# Patient Record
Sex: Female | Born: 1950 | Race: White | Hispanic: No | Marital: Married | Smoking: Never smoker
Health system: Southern US, Community
[De-identification: ages and names within clinical notes are randomized; demographics above are authoritative.]

## PROBLEM LIST (undated history)

## (undated) DIAGNOSIS — M1990 Unspecified osteoarthritis, unspecified site: Secondary | ICD-10-CM

## (undated) DIAGNOSIS — K449 Diaphragmatic hernia without obstruction or gangrene: Secondary | ICD-10-CM

## (undated) DIAGNOSIS — I10 Essential (primary) hypertension: Secondary | ICD-10-CM

## (undated) DIAGNOSIS — D500 Iron deficiency anemia secondary to blood loss (chronic): Secondary | ICD-10-CM

## (undated) DIAGNOSIS — I4891 Unspecified atrial fibrillation: Secondary | ICD-10-CM

## (undated) DIAGNOSIS — K5790 Diverticulosis of intestine, part unspecified, without perforation or abscess without bleeding: Secondary | ICD-10-CM

## (undated) DIAGNOSIS — D469 Myelodysplastic syndrome, unspecified: Secondary | ICD-10-CM

## (undated) DIAGNOSIS — G4733 Obstructive sleep apnea (adult) (pediatric): Secondary | ICD-10-CM

## (undated) DIAGNOSIS — K257 Chronic gastric ulcer without hemorrhage or perforation: Secondary | ICD-10-CM

## (undated) DIAGNOSIS — K219 Gastro-esophageal reflux disease without esophagitis: Secondary | ICD-10-CM

## (undated) DIAGNOSIS — E785 Hyperlipidemia, unspecified: Secondary | ICD-10-CM

## (undated) DIAGNOSIS — I2699 Other pulmonary embolism without acute cor pulmonale: Secondary | ICD-10-CM

## (undated) DIAGNOSIS — Z7952 Long term (current) use of systemic steroids: Secondary | ICD-10-CM

## (undated) DIAGNOSIS — N3281 Overactive bladder: Secondary | ICD-10-CM

## (undated) DIAGNOSIS — I82409 Acute embolism and thrombosis of unspecified deep veins of unspecified lower extremity: Secondary | ICD-10-CM

## (undated) DIAGNOSIS — IMO0002 Reserved for concepts with insufficient information to code with codable children: Secondary | ICD-10-CM

## (undated) DIAGNOSIS — F32A Depression, unspecified: Secondary | ICD-10-CM

## (undated) DIAGNOSIS — C946 Myelodysplastic disease, not classified: Secondary | ICD-10-CM

## (undated) DIAGNOSIS — G629 Polyneuropathy, unspecified: Secondary | ICD-10-CM

## (undated) DIAGNOSIS — K222 Esophageal obstruction: Secondary | ICD-10-CM

## (undated) DIAGNOSIS — M8580 Other specified disorders of bone density and structure, unspecified site: Secondary | ICD-10-CM

## (undated) DIAGNOSIS — H9190 Unspecified hearing loss, unspecified ear: Secondary | ICD-10-CM

## (undated) DIAGNOSIS — A5002 Early congenital syphilitic osteochondropathy: Secondary | ICD-10-CM

## (undated) DIAGNOSIS — I6381 Other cerebral infarction due to occlusion or stenosis of small artery: Secondary | ICD-10-CM

## (undated) DIAGNOSIS — F329 Major depressive disorder, single episode, unspecified: Secondary | ICD-10-CM

## (undated) DIAGNOSIS — D649 Anemia, unspecified: Secondary | ICD-10-CM

## (undated) DIAGNOSIS — M313 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: Secondary | ICD-10-CM

## (undated) DIAGNOSIS — M9080 Osteopathy in diseases classified elsewhere, unspecified site: Secondary | ICD-10-CM

## (undated) DIAGNOSIS — J8409 Other alveolar and parieto-alveolar conditions: Secondary | ICD-10-CM

## (undated) DIAGNOSIS — N841 Polyp of cervix uteri: Secondary | ICD-10-CM

## (undated) DIAGNOSIS — L97909 Non-pressure chronic ulcer of unspecified part of unspecified lower leg with unspecified severity: Secondary | ICD-10-CM

## (undated) HISTORY — DX: Obstructive sleep apnea (adult) (pediatric): G47.33

## (undated) HISTORY — DX: Diaphragmatic hernia without obstruction or gangrene: K44.9

## (undated) HISTORY — DX: Depression, unspecified: F32.A

## (undated) HISTORY — PX: LUMBAR EPIDURAL INJECTION: SHX1980

## (undated) HISTORY — PX: IVC FILTER INSERTION: CATH118245

## (undated) HISTORY — DX: Esophageal obstruction: K22.2

## (undated) HISTORY — PX: COLONOSCOPY: SHX174

## (undated) HISTORY — DX: Diverticulosis of intestine, part unspecified, without perforation or abscess without bleeding: K57.90

## (undated) HISTORY — PX: ESOPHAGOGASTRODUODENOSCOPY: SHX1529

## (undated) HISTORY — DX: Unspecified osteoarthritis, unspecified site: M19.90

## (undated) HISTORY — DX: Hyperlipidemia, unspecified: E78.5

## (undated) HISTORY — DX: Acute embolism and thrombosis of unspecified deep veins of unspecified lower extremity: I82.409

## (undated) HISTORY — DX: Other pulmonary embolism without acute cor pulmonale: I26.99

## (undated) HISTORY — DX: Reserved for concepts with insufficient information to code with codable children: IMO0002

## (undated) HISTORY — DX: Polyneuropathy, unspecified: G62.9

## (undated) HISTORY — DX: Gastro-esophageal reflux disease without esophagitis: K21.9

## (undated) HISTORY — DX: Polyp of cervix uteri: N84.1

## (undated) HISTORY — DX: Osteopathy in diseases classified elsewhere, unspecified site: M90.80

## (undated) HISTORY — DX: Major depressive disorder, single episode, unspecified: F32.9

## (undated) HISTORY — DX: Early congenital syphilitic osteochondropathy: A50.02

## (undated) HISTORY — PX: OTHER SURGICAL HISTORY: SHX169

## (undated) HISTORY — DX: Anemia, unspecified: D64.9

---
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
## 1997-12-03 ENCOUNTER — Other Ambulatory Visit: Admission: RE | Admit: 1997-12-03 | Discharge: 1997-12-03 | Payer: Self-pay | Admitting: Internal Medicine

## 1999-04-18 ENCOUNTER — Other Ambulatory Visit: Admission: RE | Admit: 1999-04-18 | Discharge: 1999-04-18 | Payer: Self-pay | Admitting: *Deleted

## 2000-07-25 ENCOUNTER — Other Ambulatory Visit: Admission: RE | Admit: 2000-07-25 | Discharge: 2000-07-25 | Payer: Self-pay | Admitting: *Deleted

## 2001-11-01 ENCOUNTER — Other Ambulatory Visit: Admission: RE | Admit: 2001-11-01 | Discharge: 2001-11-01 | Payer: Self-pay | Admitting: *Deleted

## 2002-06-24 ENCOUNTER — Ambulatory Visit (HOSPITAL_COMMUNITY): Admission: RE | Admit: 2002-06-24 | Discharge: 2002-06-24 | Payer: Self-pay | Admitting: *Deleted

## 2002-06-24 ENCOUNTER — Encounter: Payer: Self-pay | Admitting: *Deleted

## 2002-12-30 ENCOUNTER — Encounter: Payer: Self-pay | Admitting: Internal Medicine

## 2002-12-30 DIAGNOSIS — K5730 Diverticulosis of large intestine without perforation or abscess without bleeding: Secondary | ICD-10-CM | POA: Insufficient documentation

## 2003-03-22 DIAGNOSIS — N841 Polyp of cervix uteri: Secondary | ICD-10-CM

## 2003-03-22 HISTORY — DX: Polyp of cervix uteri: N84.1

## 2003-04-03 ENCOUNTER — Other Ambulatory Visit: Admission: RE | Admit: 2003-04-03 | Discharge: 2003-04-03 | Payer: Self-pay | Admitting: *Deleted

## 2003-07-29 ENCOUNTER — Ambulatory Visit (HOSPITAL_COMMUNITY): Admission: RE | Admit: 2003-07-29 | Discharge: 2003-07-29 | Payer: Self-pay | Admitting: *Deleted

## 2004-05-31 ENCOUNTER — Other Ambulatory Visit: Admission: RE | Admit: 2004-05-31 | Discharge: 2004-05-31 | Payer: Self-pay | Admitting: *Deleted

## 2004-11-23 ENCOUNTER — Ambulatory Visit (HOSPITAL_BASED_OUTPATIENT_CLINIC_OR_DEPARTMENT_OTHER): Admission: RE | Admit: 2004-11-23 | Discharge: 2004-11-23 | Payer: Self-pay | Admitting: Internal Medicine

## 2004-11-27 ENCOUNTER — Ambulatory Visit: Payer: Self-pay | Admitting: Internal Medicine

## 2005-04-26 ENCOUNTER — Ambulatory Visit (HOSPITAL_COMMUNITY): Admission: RE | Admit: 2005-04-26 | Discharge: 2005-04-26 | Payer: Self-pay | Admitting: *Deleted

## 2005-07-11 ENCOUNTER — Other Ambulatory Visit: Admission: RE | Admit: 2005-07-11 | Discharge: 2005-07-11 | Payer: Self-pay | Admitting: Obstetrics and Gynecology

## 2005-11-23 ENCOUNTER — Ambulatory Visit: Payer: Self-pay | Admitting: Internal Medicine

## 2005-12-06 ENCOUNTER — Ambulatory Visit: Payer: Self-pay

## 2006-01-24 ENCOUNTER — Ambulatory Visit: Payer: Self-pay | Admitting: Internal Medicine

## 2006-01-29 ENCOUNTER — Ambulatory Visit: Payer: Self-pay | Admitting: Internal Medicine

## 2006-02-15 ENCOUNTER — Ambulatory Visit: Payer: Self-pay | Admitting: Internal Medicine

## 2006-02-23 ENCOUNTER — Ambulatory Visit: Payer: Self-pay | Admitting: Internal Medicine

## 2006-02-23 DIAGNOSIS — K449 Diaphragmatic hernia without obstruction or gangrene: Secondary | ICD-10-CM | POA: Insufficient documentation

## 2006-04-09 ENCOUNTER — Ambulatory Visit: Payer: Self-pay | Admitting: Internal Medicine

## 2006-07-05 ENCOUNTER — Ambulatory Visit: Payer: Self-pay | Admitting: Gastroenterology

## 2006-07-05 LAB — CONVERTED CEMR LAB
Basophils Absolute: 0.1 10*3/uL (ref 0.0–0.1)
Basophils Relative: 0.7 % (ref 0.0–1.0)
Eosinophil percent: 0.9 % (ref 0.0–5.0)
HCT: 38.1 % (ref 36.0–46.0)
Hemoglobin: 12.4 g/dL (ref 12.0–15.0)
Lymphocytes Relative: 31 % (ref 12.0–46.0)
MCHC: 32.6 g/dL (ref 30.0–36.0)
MCV: 91 fL (ref 78.0–100.0)
Monocytes Absolute: 0.5 10*3/uL (ref 0.2–0.7)
Monocytes Relative: 6.2 % (ref 3.0–11.0)
Neutro Abs: 4.3 10*3/uL (ref 1.4–7.7)
Neutrophils Relative %: 61.2 % (ref 43.0–77.0)
Platelets: 294 10*3/uL (ref 150–400)
RBC: 4.18 M/uL (ref 3.87–5.11)
RDW: 14.2 % (ref 11.5–14.6)
WBC: 7.3 10*3/uL (ref 4.5–10.5)

## 2006-09-26 ENCOUNTER — Ambulatory Visit (HOSPITAL_COMMUNITY): Admission: RE | Admit: 2006-09-26 | Discharge: 2006-09-26 | Payer: Self-pay | Admitting: Obstetrics and Gynecology

## 2006-09-26 ENCOUNTER — Ambulatory Visit: Payer: Self-pay | Admitting: Family Medicine

## 2006-11-26 ENCOUNTER — Other Ambulatory Visit: Admission: RE | Admit: 2006-11-26 | Discharge: 2006-11-26 | Payer: Self-pay | Admitting: Obstetrics and Gynecology

## 2006-11-29 ENCOUNTER — Ambulatory Visit: Payer: Self-pay | Admitting: Internal Medicine

## 2006-11-29 LAB — CONVERTED CEMR LAB
ALT: 20 units/L (ref 0–40)
AST: 23 units/L (ref 0–37)
Albumin: 4 g/dL (ref 3.5–5.2)
Alkaline Phosphatase: 70 units/L (ref 39–117)
BUN: 13 mg/dL (ref 6–23)
Basophils Absolute: 0.1 10*3/uL (ref 0.0–0.1)
Basophils Relative: 1.4 % — ABNORMAL HIGH (ref 0.0–1.0)
Bilirubin, Direct: 0.1 mg/dL (ref 0.0–0.3)
CO2: 31 meq/L (ref 19–32)
Calcium: 9 mg/dL (ref 8.4–10.5)
Chloride: 108 meq/L (ref 96–112)
Cholesterol: 171 mg/dL (ref 0–200)
Creatinine, Ser: 0.8 mg/dL (ref 0.4–1.2)
Eosinophils Absolute: 0.1 10*3/uL (ref 0.0–0.6)
Eosinophils Relative: 1 % (ref 0.0–5.0)
GFR calc Af Amer: 96 mL/min
GFR calc non Af Amer: 79 mL/min
Glucose, Bld: 102 mg/dL — ABNORMAL HIGH (ref 70–99)
HCT: 36 % (ref 36.0–46.0)
HDL: 40.3 mg/dL (ref >39.0)
Hemoglobin: 12.6 g/dL (ref 12.0–15.0)
Hgb A1c MFr Bld: 5.8 % (ref 4.6–6.0)
LDL Cholesterol: 102 mg/dL — ABNORMAL HIGH (ref 0–99)
Lymphocytes Relative: 27.3 % (ref 12.0–46.0)
MCHC: 35 g/dL (ref 30.0–36.0)
MCV: 90.8 fL (ref 78.0–100.0)
Monocytes Absolute: 0.5 10*3/uL (ref 0.2–0.7)
Monocytes Relative: 8.5 % (ref 3.0–11.0)
Neutro Abs: 3.2 10*3/uL (ref 1.4–7.7)
Neutrophils Relative %: 61.8 % (ref 43.0–77.0)
Platelets: 249 10*3/uL (ref 150–400)
Potassium: 4 meq/L (ref 3.5–5.1)
RBC: 3.96 M/uL (ref 3.87–5.11)
RDW: 12.7 % (ref 11.5–14.6)
Sodium: 143 meq/L (ref 135–145)
TSH: 1.02 microintl units/mL (ref 0.35–5.50)
Total Bilirubin: 0.5 mg/dL (ref 0.3–1.2)
Total CHOL/HDL Ratio: 4.2
Total Protein: 7.1 g/dL (ref 6.0–8.3)
Triglycerides: 143 mg/dL (ref 0–149)
VLDL: 29 mg/dL (ref 0–40)
WBC: 5.3 10*3/uL (ref 4.5–10.5)

## 2007-10-03 ENCOUNTER — Emergency Department (HOSPITAL_COMMUNITY): Admission: EM | Admit: 2007-10-03 | Discharge: 2007-10-03 | Payer: Self-pay | Admitting: Family Medicine

## 2007-10-09 ENCOUNTER — Ambulatory Visit (HOSPITAL_COMMUNITY): Admission: RE | Admit: 2007-10-09 | Discharge: 2007-10-09 | Payer: Self-pay | Admitting: Obstetrics and Gynecology

## 2007-11-28 ENCOUNTER — Encounter: Payer: Self-pay | Admitting: Internal Medicine

## 2007-12-03 ENCOUNTER — Ambulatory Visit: Payer: Self-pay | Admitting: Internal Medicine

## 2007-12-03 ENCOUNTER — Encounter (INDEPENDENT_AMBULATORY_CARE_PROVIDER_SITE_OTHER): Payer: Self-pay | Admitting: *Deleted

## 2007-12-03 DIAGNOSIS — M2550 Pain in unspecified joint: Secondary | ICD-10-CM | POA: Insufficient documentation

## 2007-12-03 DIAGNOSIS — K219 Gastro-esophageal reflux disease without esophagitis: Secondary | ICD-10-CM | POA: Insufficient documentation

## 2007-12-03 DIAGNOSIS — J309 Allergic rhinitis, unspecified: Secondary | ICD-10-CM | POA: Insufficient documentation

## 2007-12-03 DIAGNOSIS — G4730 Sleep apnea, unspecified: Secondary | ICD-10-CM | POA: Insufficient documentation

## 2007-12-05 ENCOUNTER — Encounter (INDEPENDENT_AMBULATORY_CARE_PROVIDER_SITE_OTHER): Payer: Self-pay | Admitting: *Deleted

## 2007-12-10 ENCOUNTER — Ambulatory Visit: Payer: Self-pay | Admitting: Internal Medicine

## 2007-12-11 ENCOUNTER — Telehealth: Payer: Self-pay | Admitting: Internal Medicine

## 2007-12-11 ENCOUNTER — Ambulatory Visit: Payer: Self-pay | Admitting: Internal Medicine

## 2007-12-11 ENCOUNTER — Encounter (INDEPENDENT_AMBULATORY_CARE_PROVIDER_SITE_OTHER): Payer: Self-pay | Admitting: *Deleted

## 2007-12-11 LAB — CONVERTED CEMR LAB
Basophils Absolute: 0.1 10*3/uL (ref 0.0–0.1)
Basophils Relative: 1.1 % — ABNORMAL HIGH (ref 0.0–1.0)
Eosinophils Absolute: 0.1 10*3/uL (ref 0.0–0.7)
Eosinophils Relative: 0.8 % (ref 0.0–5.0)
Folate: 9.6 ng/mL
HCT: 25.5 % — ABNORMAL LOW (ref 36.0–46.0)
Hemoglobin: 8 g/dL — ABNORMAL LOW (ref 12.0–15.0)
Iron: 17 ug/dL — ABNORMAL LOW (ref 42–145)
Lymphocytes Relative: 22.6 % (ref 12.0–46.0)
MCHC: 31.2 g/dL (ref 30.0–36.0)
MCV: 69.4 fL — ABNORMAL LOW (ref 78.0–100.0)
Monocytes Absolute: 0.5 10*3/uL (ref 0.1–1.0)
Monocytes Relative: 7.9 % (ref 3.0–12.0)
Neutro Abs: 4.2 10*3/uL (ref 1.4–7.7)
Neutrophils Relative %: 67.6 % (ref 43.0–77.0)
OCCULT 1: NEGATIVE
OCCULT 2: NEGATIVE
OCCULT 3: NEGATIVE
Platelets: 403 10*3/uL — ABNORMAL HIGH (ref 150–400)
RBC: 3.68 M/uL — ABNORMAL LOW (ref 3.87–5.11)
RDW: 15.7 % — ABNORMAL HIGH (ref 11.5–14.6)
Saturation Ratios: 3.5 % — ABNORMAL LOW (ref 20.0–50.0)
Transferrin: 348.5 mg/dL (ref >=212.0)
Vitamin B-12: 211 pg/mL (ref 211–911)
WBC: 6.3 10*3/uL (ref 4.5–10.5)

## 2007-12-12 ENCOUNTER — Telehealth (INDEPENDENT_AMBULATORY_CARE_PROVIDER_SITE_OTHER): Payer: Self-pay | Admitting: *Deleted

## 2007-12-12 ENCOUNTER — Encounter: Payer: Self-pay | Admitting: Family Medicine

## 2007-12-25 ENCOUNTER — Telehealth (INDEPENDENT_AMBULATORY_CARE_PROVIDER_SITE_OTHER): Payer: Self-pay | Admitting: *Deleted

## 2008-01-01 ENCOUNTER — Ambulatory Visit: Payer: Self-pay | Admitting: Pulmonary Disease

## 2008-01-01 ENCOUNTER — Ambulatory Visit: Payer: Self-pay | Admitting: Internal Medicine

## 2008-01-01 DIAGNOSIS — I10 Essential (primary) hypertension: Secondary | ICD-10-CM | POA: Insufficient documentation

## 2008-01-01 LAB — CONVERTED CEMR LAB
Basophils Absolute: 0.1 10*3/uL (ref 0.0–0.1)
Basophils Relative: 1.2 % — ABNORMAL HIGH (ref 0.0–1.0)
Eosinophils Absolute: 0.1 10*3/uL (ref 0.0–0.7)
Eosinophils Relative: 1.2 % (ref 0.0–5.0)
HCT: 31.1 % — ABNORMAL LOW (ref 36.0–46.0)
Hemoglobin: 9.8 g/dL — ABNORMAL LOW (ref 12.0–15.0)
Lymphocytes Relative: 24.8 % (ref 12.0–46.0)
MCHC: 31.6 g/dL (ref 30.0–36.0)
MCV: 75.6 fL — ABNORMAL LOW (ref 78.0–100.0)
Monocytes Absolute: 0.5 10*3/uL (ref 0.1–1.0)
Monocytes Relative: 7.9 % (ref 3.0–12.0)
Neutro Abs: 3.7 10*3/uL (ref 1.4–7.7)
Neutrophils Relative %: 64.9 % (ref 43.0–77.0)
Platelets: 353 10*3/uL (ref 150–400)
RBC: 4.11 M/uL (ref 3.87–5.11)
RDW: 25.3 % — ABNORMAL HIGH (ref 11.5–14.6)
Tissue Transglutaminase Ab, IgA: 0.8 units (ref <7)
WBC: 5.9 10*3/uL (ref 4.5–10.5)

## 2008-01-02 ENCOUNTER — Other Ambulatory Visit: Admission: RE | Admit: 2008-01-02 | Discharge: 2008-01-02 | Payer: Self-pay | Admitting: Obstetrics & Gynecology

## 2008-01-07 ENCOUNTER — Telehealth: Payer: Self-pay | Admitting: Internal Medicine

## 2008-01-07 ENCOUNTER — Telehealth (INDEPENDENT_AMBULATORY_CARE_PROVIDER_SITE_OTHER): Payer: Self-pay | Admitting: *Deleted

## 2008-01-14 ENCOUNTER — Telehealth: Payer: Self-pay | Admitting: Pulmonary Disease

## 2008-01-29 ENCOUNTER — Ambulatory Visit: Payer: Self-pay | Admitting: Pulmonary Disease

## 2008-02-14 ENCOUNTER — Telehealth (INDEPENDENT_AMBULATORY_CARE_PROVIDER_SITE_OTHER): Payer: Self-pay | Admitting: *Deleted

## 2008-02-14 DIAGNOSIS — D500 Iron deficiency anemia secondary to blood loss (chronic): Secondary | ICD-10-CM | POA: Insufficient documentation

## 2008-03-03 ENCOUNTER — Ambulatory Visit: Payer: Self-pay | Admitting: Internal Medicine

## 2008-03-05 ENCOUNTER — Telehealth: Payer: Self-pay | Admitting: Pulmonary Disease

## 2008-04-02 ENCOUNTER — Telehealth: Payer: Self-pay | Admitting: Internal Medicine

## 2008-04-14 ENCOUNTER — Telehealth: Payer: Self-pay | Admitting: Internal Medicine

## 2008-05-03 ENCOUNTER — Ambulatory Visit (HOSPITAL_COMMUNITY): Admission: RE | Admit: 2008-05-03 | Discharge: 2008-05-03 | Payer: Self-pay | Admitting: Orthopedic Surgery

## 2008-05-03 ENCOUNTER — Encounter: Payer: Self-pay | Admitting: Internal Medicine

## 2008-05-06 ENCOUNTER — Encounter: Payer: Self-pay | Admitting: Internal Medicine

## 2008-05-11 ENCOUNTER — Encounter: Admission: RE | Admit: 2008-05-11 | Discharge: 2008-06-09 | Payer: Self-pay | Admitting: Orthopedic Surgery

## 2008-07-21 ENCOUNTER — Telehealth (INDEPENDENT_AMBULATORY_CARE_PROVIDER_SITE_OTHER): Payer: Self-pay | Admitting: *Deleted

## 2008-07-21 ENCOUNTER — Ambulatory Visit: Payer: Self-pay | Admitting: Internal Medicine

## 2008-07-22 LAB — CONVERTED CEMR LAB
Basophils Absolute: 0.1 10*3/uL (ref 0.0–0.1)
Basophils Relative: 1 % (ref 0.0–3.0)
Eosinophils Absolute: 0.1 10*3/uL (ref 0.0–0.7)
Eosinophils Relative: 0.8 % (ref 0.0–5.0)
HCT: 33.6 % — ABNORMAL LOW (ref 36.0–46.0)
Hemoglobin: 11.4 g/dL — ABNORMAL LOW (ref 12.0–15.0)
Lymphocytes Relative: 29.3 % (ref 12.0–46.0)
MCHC: 33.8 g/dL (ref 30.0–36.0)
MCV: 89.9 fL (ref 78.0–100.0)
Monocytes Absolute: 0.5 10*3/uL (ref 0.1–1.0)
Monocytes Relative: 7.6 % (ref 3.0–12.0)
Neutro Abs: 4.2 10*3/uL (ref 1.4–7.7)
Neutrophils Relative %: 61.3 % (ref 43.0–77.0)
Platelets: 285 10*3/uL (ref 150–400)
RBC: 3.73 M/uL — ABNORMAL LOW (ref 3.87–5.11)
RDW: 12.7 % (ref 11.5–14.6)
Vitamin B-12: 262 pg/mL (ref 211–911)
WBC: 7 10*3/uL (ref 4.5–10.5)

## 2008-07-31 ENCOUNTER — Telehealth (INDEPENDENT_AMBULATORY_CARE_PROVIDER_SITE_OTHER): Payer: Self-pay | Admitting: *Deleted

## 2008-11-17 ENCOUNTER — Ambulatory Visit (HOSPITAL_COMMUNITY): Admission: RE | Admit: 2008-11-17 | Discharge: 2008-11-17 | Payer: Self-pay | Admitting: Internal Medicine

## 2008-12-07 ENCOUNTER — Ambulatory Visit: Payer: Self-pay | Admitting: Internal Medicine

## 2008-12-07 DIAGNOSIS — R7309 Other abnormal glucose: Secondary | ICD-10-CM | POA: Insufficient documentation

## 2008-12-07 DIAGNOSIS — D510 Vitamin B12 deficiency anemia due to intrinsic factor deficiency: Secondary | ICD-10-CM | POA: Insufficient documentation

## 2008-12-07 DIAGNOSIS — M5126 Other intervertebral disc displacement, lumbar region: Secondary | ICD-10-CM | POA: Insufficient documentation

## 2008-12-07 DIAGNOSIS — E785 Hyperlipidemia, unspecified: Secondary | ICD-10-CM | POA: Insufficient documentation

## 2008-12-14 ENCOUNTER — Encounter (INDEPENDENT_AMBULATORY_CARE_PROVIDER_SITE_OTHER): Payer: Self-pay | Admitting: *Deleted

## 2008-12-29 ENCOUNTER — Ambulatory Visit: Payer: Self-pay | Admitting: Internal Medicine

## 2008-12-29 ENCOUNTER — Encounter (INDEPENDENT_AMBULATORY_CARE_PROVIDER_SITE_OTHER): Payer: Self-pay | Admitting: *Deleted

## 2008-12-29 LAB — CONVERTED CEMR LAB
OCCULT 1: NEGATIVE
OCCULT 2: NEGATIVE
OCCULT 3: NEGATIVE

## 2009-03-09 ENCOUNTER — Telehealth: Payer: Self-pay | Admitting: Internal Medicine

## 2009-03-09 DIAGNOSIS — N959 Unspecified menopausal and perimenopausal disorder: Secondary | ICD-10-CM | POA: Insufficient documentation

## 2009-04-05 ENCOUNTER — Ambulatory Visit (HOSPITAL_COMMUNITY): Admission: RE | Admit: 2009-04-05 | Discharge: 2009-04-05 | Payer: Self-pay | Admitting: Internal Medicine

## 2009-04-05 ENCOUNTER — Encounter: Payer: Self-pay | Admitting: Internal Medicine

## 2009-05-09 ENCOUNTER — Emergency Department (HOSPITAL_COMMUNITY): Admission: EM | Admit: 2009-05-09 | Discharge: 2009-05-09 | Payer: Self-pay | Admitting: Family Medicine

## 2009-05-09 ENCOUNTER — Encounter: Payer: Self-pay | Admitting: Internal Medicine

## 2009-08-15 ENCOUNTER — Ambulatory Visit: Payer: Self-pay | Admitting: Obstetrics and Gynecology

## 2009-08-15 ENCOUNTER — Inpatient Hospital Stay (HOSPITAL_COMMUNITY): Admission: AD | Admit: 2009-08-15 | Discharge: 2009-08-15 | Payer: Self-pay | Admitting: Obstetrics and Gynecology

## 2009-08-18 ENCOUNTER — Telehealth (INDEPENDENT_AMBULATORY_CARE_PROVIDER_SITE_OTHER): Payer: Self-pay | Admitting: *Deleted

## 2009-12-30 ENCOUNTER — Ambulatory Visit (HOSPITAL_COMMUNITY): Admission: RE | Admit: 2009-12-30 | Discharge: 2009-12-30 | Payer: Self-pay | Admitting: Obstetrics and Gynecology

## 2010-01-10 ENCOUNTER — Ambulatory Visit: Payer: Self-pay | Admitting: Internal Medicine

## 2010-01-10 DIAGNOSIS — R011 Cardiac murmur, unspecified: Secondary | ICD-10-CM | POA: Insufficient documentation

## 2010-01-11 ENCOUNTER — Telehealth: Payer: Self-pay | Admitting: Internal Medicine

## 2010-01-11 ENCOUNTER — Ambulatory Visit: Payer: Self-pay | Admitting: Internal Medicine

## 2010-01-11 LAB — CONVERTED CEMR LAB
ALT: 12 units/L (ref 0–35)
AST: 19 units/L (ref 0–37)
Albumin: 4 g/dL (ref 3.5–5.2)
Alkaline Phosphatase: 61 units/L (ref 39–117)
BUN: 10 mg/dL (ref 6–23)
Basophils Absolute: 0 10*3/uL (ref 0.0–0.1)
Basophils Relative: 0.8 % (ref 0.0–3.0)
Bilirubin, Direct: 0.1 mg/dL (ref 0.0–0.3)
CO2: 29 meq/L (ref 19–32)
Calcium: 9.4 mg/dL (ref 8.4–10.5)
Chloride: 108 meq/L (ref 96–112)
Cholesterol: 145 mg/dL (ref 0–200)
Creatinine, Ser: 0.7 mg/dL (ref 0.4–1.2)
Eosinophils Absolute: 0 10*3/uL (ref 0.0–0.7)
Eosinophils Relative: 0.9 % (ref 0.0–5.0)
GFR calc non Af Amer: 96.01 mL/min (ref >60)
Glucose, Bld: 94 mg/dL (ref 70–99)
HCT: 23.8 % — CL (ref 36.0–46.0)
HDL: 44.4 mg/dL (ref >39.00)
Hemoglobin: 7.5 g/dL — CL (ref 12.0–15.0)
Iron: 16 ug/dL — ABNORMAL LOW (ref 42–145)
LDL Cholesterol: 88 mg/dL (ref 0–99)
Lymphocytes Relative: 25 % (ref 12.0–46.0)
Lymphs Abs: 1.3 10*3/uL (ref 0.7–4.0)
MCHC: 31.4 g/dL (ref 30.0–36.0)
MCV: 69.3 fL — ABNORMAL LOW (ref 78.0–100.0)
Monocytes Absolute: 0.5 10*3/uL (ref 0.1–1.0)
Monocytes Relative: 9.2 % (ref 3.0–12.0)
Neutro Abs: 3.3 10*3/uL (ref 1.4–7.7)
Neutrophils Relative %: 64.1 % (ref 43.0–77.0)
Platelets: 438 10*3/uL — ABNORMAL HIGH (ref 150.0–400.0)
Potassium: 4.3 meq/L (ref 3.5–5.1)
RBC: 3.44 M/uL — ABNORMAL LOW (ref 3.87–5.11)
RDW: 17.7 % — ABNORMAL HIGH (ref 11.5–14.6)
Sodium: 142 meq/L (ref 135–145)
TSH: 1 microintl units/mL (ref 0.35–5.50)
Total Bilirubin: 0.5 mg/dL (ref 0.3–1.2)
Total CHOL/HDL Ratio: 3
Total Protein: 7.4 g/dL (ref 6.0–8.3)
Triglycerides: 64 mg/dL (ref 0.0–149.0)
VLDL: 12.8 mg/dL (ref 0.0–40.0)
WBC: 5.1 10*3/uL (ref 4.5–10.5)

## 2010-01-14 ENCOUNTER — Telehealth (INDEPENDENT_AMBULATORY_CARE_PROVIDER_SITE_OTHER): Payer: Self-pay | Admitting: *Deleted

## 2010-01-14 LAB — CONVERTED CEMR LAB: Vit D, 25-Hydroxy: 29 ng/mL — ABNORMAL LOW (ref 30–89)

## 2010-01-25 ENCOUNTER — Ambulatory Visit: Payer: Self-pay | Admitting: Internal Medicine

## 2010-01-26 ENCOUNTER — Telehealth: Payer: Self-pay | Admitting: Internal Medicine

## 2010-01-27 LAB — CONVERTED CEMR LAB
Basophils Absolute: 0.1 10*3/uL (ref 0.0–0.1)
Basophils Relative: 1.1 % (ref 0.0–3.0)
Eosinophils Absolute: 0.1 10*3/uL (ref 0.0–0.7)
Eosinophils Relative: 1.7 % (ref 0.0–5.0)
HCT: 27.9 % — ABNORMAL LOW (ref 36.0–46.0)
Hemoglobin: 8.8 g/dL — ABNORMAL LOW (ref 12.0–15.0)
Lymphocytes Relative: 28.1 % (ref 12.0–46.0)
Lymphs Abs: 1.5 10*3/uL (ref 0.7–4.0)
MCHC: 31.4 g/dL (ref 30.0–36.0)
MCV: 73.1 fL — ABNORMAL LOW (ref 78.0–100.0)
Monocytes Absolute: 0.4 10*3/uL (ref 0.1–1.0)
Monocytes Relative: 7.5 % (ref 3.0–12.0)
Neutro Abs: 3.4 10*3/uL (ref 1.4–7.7)
Neutrophils Relative %: 61.6 % (ref 43.0–77.0)
Platelets: 365 10*3/uL (ref 150.0–400.0)
RBC: 3.82 M/uL — ABNORMAL LOW (ref 3.87–5.11)
RDW: 24.7 % — ABNORMAL HIGH (ref 11.5–14.6)
Vitamin B-12: 227 pg/mL (ref 211–911)
WBC: 5.4 10*3/uL (ref 4.5–10.5)

## 2010-02-01 ENCOUNTER — Ambulatory Visit: Payer: Self-pay | Admitting: Internal Medicine

## 2010-02-01 ENCOUNTER — Encounter (INDEPENDENT_AMBULATORY_CARE_PROVIDER_SITE_OTHER): Payer: Self-pay | Admitting: *Deleted

## 2010-02-01 LAB — CONVERTED CEMR LAB
OCCULT 1: NEGATIVE
OCCULT 2: NEGATIVE
OCCULT 3: NEGATIVE

## 2010-02-16 ENCOUNTER — Ambulatory Visit: Payer: Self-pay | Admitting: Internal Medicine

## 2010-02-16 ENCOUNTER — Encounter: Payer: Self-pay | Admitting: Internal Medicine

## 2010-02-16 DIAGNOSIS — D509 Iron deficiency anemia, unspecified: Secondary | ICD-10-CM | POA: Insufficient documentation

## 2010-02-24 ENCOUNTER — Ambulatory Visit: Payer: Self-pay | Admitting: Internal Medicine

## 2010-03-25 ENCOUNTER — Emergency Department (HOSPITAL_COMMUNITY): Admission: EM | Admit: 2010-03-25 | Discharge: 2010-03-25 | Payer: Self-pay | Admitting: Emergency Medicine

## 2010-03-27 ENCOUNTER — Emergency Department (HOSPITAL_COMMUNITY): Admission: EM | Admit: 2010-03-27 | Discharge: 2010-03-27 | Payer: Self-pay | Admitting: Family Medicine

## 2010-08-04 ENCOUNTER — Emergency Department (HOSPITAL_COMMUNITY)
Admission: EM | Admit: 2010-08-04 | Discharge: 2010-08-04 | Payer: Self-pay | Source: Home / Self Care | Admitting: Family Medicine

## 2010-08-04 ENCOUNTER — Encounter: Payer: Self-pay | Admitting: Internal Medicine

## 2010-08-09 ENCOUNTER — Ambulatory Visit: Payer: Self-pay | Admitting: Internal Medicine

## 2010-08-09 DIAGNOSIS — L0291 Cutaneous abscess, unspecified: Secondary | ICD-10-CM | POA: Insufficient documentation

## 2010-08-09 DIAGNOSIS — L0390 Cellulitis, unspecified: Secondary | ICD-10-CM

## 2010-08-11 ENCOUNTER — Telehealth (INDEPENDENT_AMBULATORY_CARE_PROVIDER_SITE_OTHER): Payer: Self-pay | Admitting: *Deleted

## 2010-08-11 LAB — CONVERTED CEMR LAB
Basophils Absolute: 0 10*3/uL (ref 0.0–0.1)
Basophils Relative: 0.4 % (ref 0.0–3.0)
Eosinophils Absolute: 0.1 10*3/uL (ref 0.0–0.7)
Eosinophils Relative: 1.1 % (ref 0.0–5.0)
HCT: 33.6 % — ABNORMAL LOW (ref 36.0–46.0)
Hemoglobin: 11.4 g/dL — ABNORMAL LOW (ref 12.0–15.0)
Lymphocytes Relative: 29.9 % (ref 12.0–46.0)
Lymphs Abs: 2.1 10*3/uL (ref 0.7–4.0)
MCHC: 33.8 g/dL (ref 30.0–36.0)
MCV: 91.9 fL (ref 78.0–100.0)
Monocytes Absolute: 0.2 10*3/uL (ref 0.1–1.0)
Monocytes Relative: 2.5 % — ABNORMAL LOW (ref 3.0–12.0)
Neutro Abs: 4.6 10*3/uL (ref 1.4–7.7)
Neutrophils Relative %: 66.1 % (ref 43.0–77.0)
Platelets: 302 10*3/uL (ref 150.0–400.0)
RBC: 3.66 M/uL — ABNORMAL LOW (ref 3.87–5.11)
RDW: 15.8 % — ABNORMAL HIGH (ref 11.5–14.6)
Sed Rate: 24 mm/hr — ABNORMAL HIGH (ref 0–22)
WBC: 7 10*3/uL (ref 4.5–10.5)

## 2010-08-21 ENCOUNTER — Emergency Department (HOSPITAL_COMMUNITY)
Admission: EM | Admit: 2010-08-21 | Discharge: 2010-08-21 | Payer: Self-pay | Source: Home / Self Care | Admitting: Family Medicine

## 2010-08-21 DIAGNOSIS — M313 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: Secondary | ICD-10-CM

## 2010-08-21 HISTORY — DX: 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: M31.30

## 2010-08-23 ENCOUNTER — Other Ambulatory Visit: Payer: Self-pay | Admitting: Internal Medicine

## 2010-08-23 ENCOUNTER — Ambulatory Visit
Admission: RE | Admit: 2010-08-23 | Discharge: 2010-08-23 | Payer: Self-pay | Source: Home / Self Care | Attending: Internal Medicine | Admitting: Internal Medicine

## 2010-08-23 ENCOUNTER — Encounter (INDEPENDENT_AMBULATORY_CARE_PROVIDER_SITE_OTHER): Payer: Self-pay | Admitting: *Deleted

## 2010-08-23 ENCOUNTER — Telehealth: Payer: Self-pay | Admitting: Internal Medicine

## 2010-08-23 DIAGNOSIS — R21 Rash and other nonspecific skin eruption: Secondary | ICD-10-CM | POA: Insufficient documentation

## 2010-08-23 LAB — CONVERTED CEMR LAB
Bilirubin Urine: NEGATIVE
Blood in Urine, dipstick: NEGATIVE
Glucose, Urine, Semiquant: NEGATIVE
Inflenza A Ag: NEGATIVE
Influenza B Ag: NEGATIVE
Ketones, urine, test strip: NEGATIVE
Nitrite: NEGATIVE
Protein, U semiquant: 30
Specific Gravity, Urine: 1.015
Urobilinogen, UA: 0.2
WBC Urine, dipstick: NEGATIVE
pH: 5

## 2010-08-24 ENCOUNTER — Telehealth: Payer: Self-pay | Admitting: Internal Medicine

## 2010-08-24 ENCOUNTER — Encounter: Payer: Self-pay | Admitting: Internal Medicine

## 2010-08-24 DIAGNOSIS — R51 Headache: Secondary | ICD-10-CM | POA: Insufficient documentation

## 2010-08-24 DIAGNOSIS — R519 Headache, unspecified: Secondary | ICD-10-CM | POA: Insufficient documentation

## 2010-08-24 LAB — CBC WITH DIFFERENTIAL/PLATELET
Basophils Absolute: 0 10*3/uL (ref 0.0–0.1)
Basophils Relative: 0.5 % (ref 0.0–3.0)
Eosinophils Absolute: 0 10*3/uL (ref 0.0–0.7)
Eosinophils Relative: 0.4 % (ref 0.0–5.0)
HCT: 32.5 % — ABNORMAL LOW (ref 36.0–46.0)
Hemoglobin: 10.9 g/dL — ABNORMAL LOW (ref 12.0–15.0)
Lymphocytes Relative: 20.5 % (ref 12.0–46.0)
Lymphs Abs: 0.9 10*3/uL (ref 0.7–4.0)
MCHC: 33.5 g/dL (ref 30.0–36.0)
MCV: 90.4 fl (ref 78.0–100.0)
Monocytes Absolute: 0 10*3/uL — ABNORMAL LOW (ref 0.1–1.0)
Monocytes Relative: 0.9 % — ABNORMAL LOW (ref 3.0–12.0)
Neutro Abs: 3.6 10*3/uL (ref 1.4–7.7)
Neutrophils Relative %: 77.7 % — ABNORMAL HIGH (ref 43.0–77.0)
Platelets: 290 10*3/uL (ref 150.0–400.0)
RBC: 3.59 Mil/uL — ABNORMAL LOW (ref 3.87–5.11)
RDW: 15.9 % — ABNORMAL HIGH (ref 11.5–14.6)
WBC: 4.6 10*3/uL (ref 4.5–10.5)

## 2010-08-24 LAB — BASIC METABOLIC PANEL
BUN: 12 mg/dL (ref 6–23)
CO2: 23 mEq/L (ref 19–32)
Calcium: 8.7 mg/dL (ref 8.4–10.5)
Chloride: 99 mEq/L (ref 96–112)
Creatinine, Ser: 0.9 mg/dL (ref 0.4–1.2)
GFR: 72.8 mL/min (ref >60.00)
Glucose, Bld: 111 mg/dL — ABNORMAL HIGH (ref 70–99)
Potassium: 4.5 mEq/L (ref 3.5–5.1)
Sodium: 132 mEq/L — ABNORMAL LOW (ref 135–145)

## 2010-08-24 LAB — HEPATIC FUNCTION PANEL
ALT: 33 U/L (ref 0–35)
AST: 29 U/L (ref 0–37)
Albumin: 3.1 g/dL — ABNORMAL LOW (ref 3.5–5.2)
Alkaline Phosphatase: 252 U/L — ABNORMAL HIGH (ref 39–117)
Bilirubin, Direct: 0.2 mg/dL (ref 0.0–0.3)
Total Bilirubin: 0.6 mg/dL (ref 0.3–1.2)
Total Protein: 6.9 g/dL (ref 6.0–8.3)

## 2010-08-26 ENCOUNTER — Telehealth (INDEPENDENT_AMBULATORY_CARE_PROVIDER_SITE_OTHER): Payer: Self-pay | Admitting: *Deleted

## 2010-08-26 ENCOUNTER — Ambulatory Visit: Payer: Self-pay | Admitting: Cardiology

## 2010-08-26 ENCOUNTER — Encounter: Payer: Self-pay | Admitting: Internal Medicine

## 2010-09-18 LAB — CONVERTED CEMR LAB
ALT: 16 units/L (ref 0–35)
ALT: 21 units/L (ref 0–35)
AST: 22 units/L (ref 0–37)
AST: 25 units/L (ref 0–37)
Albumin: 3.8 g/dL (ref 3.5–5.2)
Albumin: 4.2 g/dL (ref 3.5–5.2)
Alkaline Phosphatase: 75 units/L (ref 39–117)
Alkaline Phosphatase: 76 units/L (ref 39–117)
BUN: 10 mg/dL (ref 6–23)
BUN: 14 mg/dL (ref 6–23)
Basophils Absolute: 0 10*3/uL (ref 0.0–0.1)
Basophils Absolute: 0.1 10*3/uL (ref 0.0–0.1)
Basophils Relative: 0.8 % (ref 0.0–3.0)
Basophils Relative: 1 % (ref 0.0–1.0)
Bilirubin, Direct: 0 mg/dL (ref 0.0–0.3)
Bilirubin, Direct: 0.1 mg/dL (ref 0.0–0.3)
CO2: 28 meq/L (ref 19–32)
CO2: 29 meq/L (ref 19–32)
Calcium: 9 mg/dL (ref 8.4–10.5)
Calcium: 9.3 mg/dL (ref 8.4–10.5)
Chloride: 108 meq/L (ref 96–112)
Chloride: 109 meq/L (ref 96–112)
Cholesterol, target level: 200 mg/dL
Cholesterol: 147 mg/dL (ref 0–200)
Cholesterol: 163 mg/dL (ref 0–200)
Creatinine, Ser: 0.7 mg/dL (ref 0.4–1.2)
Creatinine, Ser: 0.7 mg/dL (ref 0.4–1.2)
Eosinophils Absolute: 0.1 10*3/uL (ref 0.0–0.7)
Eosinophils Absolute: 0.1 10*3/uL (ref 0.0–0.7)
Eosinophils Relative: 1 % (ref 0.0–5.0)
Eosinophils Relative: 1.3 % (ref 0.0–5.0)
Folate: 12.3 ng/mL
GFR calc Af Amer: 111 mL/min
GFR calc non Af Amer: 91.63 mL/min (ref >60)
GFR calc non Af Amer: 92 mL/min
Glucose, Bld: 83 mg/dL (ref 70–99)
Glucose, Bld: 95 mg/dL (ref 70–99)
HCT: 28.3 % — ABNORMAL LOW (ref 36.0–46.0)
HCT: 34.8 % — ABNORMAL LOW (ref 36.0–46.0)
HDL goal, serum: 50 mg/dL
HDL: 38.4 mg/dL — ABNORMAL LOW (ref >39.0)
HDL: 47.9 mg/dL (ref >39.00)
Hemoglobin: 11.7 g/dL — ABNORMAL LOW (ref 12.0–15.0)
Hemoglobin: 8.5 g/dL — ABNORMAL LOW (ref 12.0–15.0)
Hgb A1c MFr Bld: 5.9 % (ref 4.6–6.5)
Hgb A1c MFr Bld: 6.1 % — ABNORMAL HIGH (ref 4.6–6.0)
Iron: 116 ug/dL (ref 42–145)
LDL Cholesterol: 92 mg/dL (ref 0–99)
LDL Cholesterol: 98 mg/dL (ref 0–99)
LDL Goal: 110 mg/dL
Lymphocytes Relative: 27.5 % (ref 12.0–46.0)
Lymphocytes Relative: 30.6 % (ref 12.0–46.0)
Lymphs Abs: 1.7 10*3/uL (ref 0.7–4.0)
MCHC: 30 g/dL (ref 30.0–36.0)
MCHC: 33.7 g/dL (ref 30.0–36.0)
MCV: 71.2 fL — ABNORMAL LOW (ref 78.0–100.0)
MCV: 89 fL (ref 78.0–100.0)
Monocytes Absolute: 0.4 10*3/uL (ref 0.1–1.0)
Monocytes Absolute: 0.5 10*3/uL (ref 0.1–1.0)
Monocytes Relative: 7.2 % (ref 3.0–12.0)
Monocytes Relative: 7.7 % (ref 3.0–12.0)
Neutro Abs: 3.2 10*3/uL (ref 1.4–7.7)
Neutro Abs: 3.6 10*3/uL (ref 1.4–7.7)
Neutrophils Relative %: 60.4 % (ref 43.0–77.0)
Neutrophils Relative %: 62.5 % (ref 43.0–77.0)
Platelets: 264 10*3/uL (ref 150.0–400.0)
Platelets: 442 10*3/uL — ABNORMAL HIGH (ref 150–400)
Potassium: 3.8 meq/L (ref 3.5–5.1)
Potassium: 4.2 meq/L (ref 3.5–5.1)
RBC: 3.92 M/uL (ref 3.87–5.11)
RBC: 3.98 M/uL (ref 3.87–5.11)
RDW: 14.7 % — ABNORMAL HIGH (ref 11.5–14.6)
RDW: 16.9 % — ABNORMAL HIGH (ref 11.5–14.6)
Rhuematoid fact SerPl-aCnc: <20 intl units/mL — ABNORMAL LOW (ref 0.0–20.0)
Saturation Ratios: 24.5 % (ref 20.0–50.0)
Sed Rate: 36 mm/hr — ABNORMAL HIGH (ref 0–22)
Sodium: 142 meq/L (ref 135–145)
Sodium: 144 meq/L (ref 135–145)
TSH: 0.65 microintl units/mL (ref 0.35–5.50)
TSH: 0.74 microintl units/mL (ref 0.35–5.50)
Total Bilirubin: 0.6 mg/dL (ref 0.3–1.2)
Total Bilirubin: 0.8 mg/dL (ref 0.3–1.2)
Total CHOL/HDL Ratio: 3
Total CHOL/HDL Ratio: 3.8
Total Protein: 7.6 g/dL (ref 6.0–8.3)
Total Protein: 7.6 g/dL (ref 6.0–8.3)
Transferrin: 338.2 mg/dL (ref 212.0–360.0)
Triglycerides: 83 mg/dL (ref 0–149)
Triglycerides: 85 mg/dL (ref 0.0–149.0)
Uric Acid, Serum: 5.2 mg/dL (ref 2.4–7.0)
VLDL: 17 mg/dL (ref 0.0–40.0)
VLDL: 17 mg/dL (ref 0–40)
Vitamin B-12: 248 pg/mL (ref 211–911)
WBC: 5.4 10*3/uL (ref 4.5–10.5)
WBC: 5.9 10*3/uL (ref 4.5–10.5)

## 2010-09-20 NOTE — Letter (Signed)
Summary: Results Follow-up Letter  [facility] [place]   [place], [place] [address]   Phone: [phone]  Fax: [phone]    12/14/2008        Shabre Seebeck [address] [place], [place]  [address]  Dear Ms. Roma,   The following are the results of your recent test(s):  Test     Result     Pap Smear    Normal_______  Not Normal_____       Comments: _________________________________________________________ Cholesterol LDL(Bad cholesterol):          Your goal is less than:         HDL (Good cholesterol):        Your goal is more than: _________________________________________________________ Other Tests:   _________________________________________________________  Please call for an appointment Or _Please see attached lab results and call office to recheck labs in 6 months.________________________________________________________ _________________________________________________________ _________________________________________________________  Sincerely,  Carley Hammed [place] at [facility]

## 2010-09-20 NOTE — Assessment & Plan Note (Signed)
Summary: B12 LEVEL LOW,?GI BLEED/CD   History of Present Illness Visit Type: consult Primary GI MD: Scarlette Shorts MD Primary MD: Unice Cobble Referral MD: Unice Cobble Chief Complaint: iron deficiency anemia History of Present Illness:  this is a 60 year old white female with a history of hypertension, osteoarthritis, sleep apnea, gastroesophageal reflux disease, incidental diverticulosis, and sleep apnea. She presents today for evaluation of recurrent iron deficiency anemia. The patient was evaluated for anemia and Hemoccult-positive stool in June of 2007. She underwent colonoscopy and upper endoscopy. Colonoscopy, including intubation of the terminal ileum, was normal except for sigmoid diverticulosis. Upper endoscopy revealed a distal esophageal stricture, secondary to reflux, and a small hiatal hernia. No other abnormalities. She had been using nonsteroidal anti-inflammatory drugs regularly and this was felt to be the cause of her iron deficiency anemia and Hemoccult-positive stool. She was placed on iron and her hemoglobin returned to normal. She has not been seen since. The patient was to continue on iron indefinitely, but has not been on the agent. She denies any GI symptoms. She has not seen blood. She has been noticing some nonspecific fatigue, as well as a craving for ice. Routine evaluation with Dr. Linna Darner, revealed anemia with a hemoglobin of 8.0. Her MCV was low at 69. Iron studies were consistent with iron deficiency. Her iron saturation was 3.5%. B12 was borderline at 211. Folate normal at 9.6. Hemoccult cards were negative x3. Patient has not had a menstrual period in 5 years and she does not donate blood.   GI Review of Systems       Denies abdominal pain, acid reflux, belching, bloating, chest pain, dysphagia with liquids, dysphagia with solids, heartburn, loss of appetite, nausea, vomiting, vomiting blood, weight loss, and  weight gain.      Denies black tarry stools, change  in bowel habit, constipation, diarrhea, heme positive stool, hemorrhoids, irritable bowel syndrome, jaundice, light color stool, liver problems, rectal bleeding, and  rectal pain.  Colonoscopy  Procedure date:  02/23/2006  Findings:      Normal, including intubation of the terminal ileum, except for sigmoid diverticulosis.  EGD  Procedure date:  02/23/2006  Findings:      #1 benign distal esophageal stricture, #2 small hiatal hernia. #3 otherwise normal exam to the post bulbar duodenum.     Prior Medications Reviewed Using: List Brought by Patient  Updated Prior Medication List: OMEPRAZOLE 20 MG  CPDR (OMEPRAZOLE) 1 by mouth q am as needed AMLODIPINE BESYLATE 5 MG  TABS (AMLODIPINE BESYLATE) 1 by mouth once daily LISINOPRIL 20 MG  TABS (LISINOPRIL) 1 by mouth once daily NIFEREX-150 150-50-50 MG  CAPS (Henderson AC-C-THRE AC) Take one capsule twice daily. MULTIVITAMINS   TABS (MULTIPLE VITAMIN) once daily SM CALCIUM 500 MG  TABS (OYSTER SHELL) two times a day  Current Allergies (reviewed today): ! CODEINE  Past Medical History:    anemia-recurrent iron deficiency, low normal B12 level.    sleep apnea/ intolerant to CPAP    Allergic rhinitis    GERD    Hypertension    osteoarthritis    Depression    Diverticulosis  Past Surgical History:    none   Family History:    mother htn, hysterectomy for fibroids, peripheral neuropathy    father cva, lung cancer, pre cancerous skin  lesions, arthriits    maternal grandfather diabetes    maternal uncle diabetes    maternal aunt diabetes    No gastrointestinal malignancy or history of anemia  Social History:  Never Smoked    Alcohol use-yes occasional    Weight watchers    Occupation: neonatal intensive care nurse at [employer]   Risk Factors:  Colonoscopy History:     Date of Last Colonoscopy:  02/23/2006    Results:  Normal, including intubation of the terminal ileum, except for sigmoid diverticulosis.     Review of Systems       fatigue and ice craving, otherwise, entirely negative.   Vital Signs:  Patient Profile:   60 Years Old Female Height:     64 inches Weight:      202 pounds BMI:     34.80 Pulse rate:   80 / minute BP sitting:   120 / 70  (left arm)                  Physical Exam  General:     Well developed, well nourished, no acute distress. Head:     Normocephalic and atraumatic. Eyes:     PERRLA, no icterus.conjunctiva pale Mouth:     No deformity or lesions, dentition normal. Neck:     Supple; no masses or thyromegaly. Lungs:     Clear throughout to auscultation. Heart:     Regular rate and rhythm; no murmurs, rubs,  or bruits. Abdomen:     Soft, nontender and nondistended. No masses, hepatosplenomegaly or hernias noted. Normal bowel sounds. Msk:     Symmetrical with no gross deformities. Normal posture. Pulses:     Normal pulses noted. Extremities:     No clubbing, cyanosis, edema or deformities noted. Neurologic:     Alert and  oriented x4;  grossly normal neurologically. Skin:     Intact without significant lesions or rashes.mildly pale Cervical Nodes:     no cervical or supraclavicular adenopathy Psych:     Alert and cooperative. Normal mood and affect.    Impression & Recommendations:  Problem # 1:  UNSPECIFIED IRON DEFICIENCY ANEMIA (ICD-280.9) Recurrent iron deficiency anemia. No evidence of occult or overt GI blood loss. Negative GI review of systems. Prior negative workup with upper endoscopy and colonoscopy. Rule out occult small bowel lesion. Rule out iron absorptive disorder (i.e. sprue). Anemia may be compounded by an element of B12 deficiency.  Recommend: #1 tissue transglutaminase antibody as a screening test for sprue                       #2 capsule endoscopy to exclude small intestinal mucosal lesion                       #3 oral iron therapy indefinitely                       #4 consider B12 replacement therapy. Defer  to Dr. Linna Darner   Problem # 2:  GERD (ICD-530.81) Asymptomatic on Prilosec.  Advised to continue Prilosec and adhere to reflux precautions.  Medications Added to Medication List This Visit: 1)  Multivitamins Tabs (Multiple vitamin) .... Once daily 2)  Sm Calcium 500 Mg Tabs (Oyster shell) .... Two times a day     ]  copy to Dr. Unice Cobble  Appended Document: Orders Update endo capsule    Clinical Lists Changes  Orders: Added new Test order of Capsule Endoscopy (Capsule Endoscopy) - Signed

## 2010-09-20 NOTE — Progress Notes (Signed)
Summary: Lab Results  Phone Note Outgoing Call Call back at [facility] Phone [phone]   Call placed by: Georgette Dover,  Jan 14, 2010 4:37 PM Call placed to: Patient Summary of Call: Left message on VM with the following information below:  Minimal vitamin D goal = 40-60. Please add 1000 International Units vitamin D3 to present daily dose. Recheck vit D level in 6 moonths (268.9). Please repeat CBC with B12 level  week of 05/30 to rule out progressive anemia. Please take the iron supplements as previously Rxed. (285.9). [place] patient to call Tuesday (we are closed Monday) to schedule appointment(s) to recheck labs (Copy of reports mailed).Georgette Dover  Jan 14, 2010 4:38 PM

## 2010-09-20 NOTE — Letter (Signed)
Summary: Results Follow-up Letter  [facility]   [place], [place] [address]   Phone: [phone]  Fax: [phone]    12/11/2007        [name] [address] [place], [place]  [address]  Dear Ms. Baccari,   The following are the results of your recent test(s):  Test     Result     Pap Smear    Normal_______  Not Normal_____       Comments: _________________________________________________________ Cholesterol LDL(Bad cholesterol):          Your goal is less than:         HDL (Good cholesterol):        Your goal is more than: _________________________________________________________ Other Tests:   _________________________________________________________  Please call for an appointment Or __Negative stool cards._______________________________________________________ _________________________________________________________ _________________________________________________________  Sincerely,  Carley Hammed [place] at [facility]

## 2010-09-20 NOTE — Medication Information (Signed)
Summary: Letter Regarding Nexium & Anemia/[facility]  Letter Regarding Nexium & Anemia/[facility]   Imported By: Edmonia James 06/22/2009 08:41:22  _____________________________________________________________________  External Attachment:    Type:   Image     Comment:   External Document

## 2010-09-20 NOTE — Progress Notes (Signed)
Summary: REFILL REQUEST-VIELLE IROSPAN  Phone Note Refill Request   Refills Requested: Medication #1:  VITELLE IROSPAN ER 150-65 MG  TBCR 1 bid. [place] [address] VITELLE IROSPAN...  Initial call taken by: Allen Norris,  December 12, 2007 11:27 AM      Prescriptions: Dyke Maes ER 150-65 MG  TBCR (FERROUS SULFATE-VITAMIN C) 1 bid  #60 x 3   Entered and Authorized by:   Carley Hammed   Signed by:   Carley Hammed on 12/12/2007   Method used:   Electronically sent to ...       [facility] # [phone]*       [id number]       [place], [place]  [address]       Ph: [phone] or [phone]       Fax: [phone]   RxID:   [id number]

## 2010-09-20 NOTE — Miscellaneous (Signed)
Summary: [facility] Office Visit                               [place] OFFICE NOTE   Joyce, Kaufman                       MRN:          [id number]  DATE:04/09/2006                            DOB:          1951-04-01    HISTORY:  This is a 60 year old female who was evaluated February 15, 2006 for  anemia and Hemoccult positive stool.  See that dictation for details.  Her  most recent hemoglobin was January 24, 2006, at which time it measured 10.4.  Her MCV was 79.  She had had some Hemoccult cards earlier in the year which  returned positive.  She is noted to have reflux disease.  She was taking  Aleve several times per week for arthritis.  GI review of systems was really  unremarkable.  She underwent colonoscopy and upper endoscopy February 23, 2006.  Colonoscopy, including intubation of the terminal ileum, revealed only  diverticulosis.  Upper endoscopy revealed benign distal stricture of the  esophagus and a small sliding hiatal hernia.  No evidence of erosive change,  Barrett's, Cameron erosions, AVMs, or other lesions.  She was to take iron  b.i.d. and return at this time for a followup.  Aside from an ongoing head  cold, she feels well.  She has only been taking her iron once a day.  No GI  complaints.  She continues on Prilosec for reflux.   CURRENT MEDICATIONS:  1. Prilosec.  2. Lisinopril.  3. Amlodipine.  4. B6.  5. Multivitamin.  6. Calcium.  (She is no longer using Aleve).   PHYSICAL EXAMINATION:  GENERAL:  Well-appearing female in no acute distress.  VITAL SIGNS:  Blood pressure of 120/82, heart rate is 88, weight is 206.6  pounds.  HEENT:  Sclerae are anicteric.  Conjunctivae are pink.  EXTREMITIES:  Her hands reveal good color in the palms.   IMPRESSION:  Recent interval development of anemia associated with Hemoccult  positive stool.  Unremarkable colonoscopy and upper endoscopy, as described.  Suspect  intermittent gastrointestinal mucosal lesions from chronic Aleve.  She has been off the drug now for 6 weeks and on low-dose iron.   RECOMMENDATIONS:  1. Recheck CBC today.  If improved, continue iron once daily.  2. If CBC not improved, then increase iron to b.i.d. and recheck labs in 6      weeks.  If still abnormal, consider hematology evaluation and/or      capsule endoscopy.  3. Continue Prilosec for reflux disease.  4. Ongoing general medical care with Dr. Linna Darner.                                   Docia Chuck. Geri [place]in[place]., MD   JNP/MedQ  DD:  04/09/2006  DT:  04/09/2006  Job #:  [id number]   cc:   Darrick Penna. Linna Darner, MD,  FACP, FCCP  Cynthia P. Romine, MD

## 2010-09-20 NOTE — Progress Notes (Signed)
Summary: Request for labs   Phone Note Call from Patient Call back at [phone]   Caller: Patient Summary of Call: Message left on VM: Request for lab results   Spoke with patient and informed her labs slightly better, Iron is making her sick on her stomach, diarrhea and vomitting. Patient not sure what to do cause she knows she needs the Iron  Dr.Danell Vazquez please advise (patient noted she will be at work tomorrow and to leave any info on her VM)  [name]  January 26, 2010 3:45 PM   [facility]  January 26, 2010 3:44 PM   Follow-up for Phone Call        recheck CBC ;decrease iron to once daily (285.9). Note:Already  done; I'll review & give her recommendations Follow-up by: Unice Cobble MD,  January 26, 2010 5:09 PM  Additional Follow-up for Phone Call Additional follow up Details #1::        See labs; HCT up but severe anemia still present.B12 low normal. Heme consult recommended due to recurrent  severe anemia, iron intolerance by mouth & low B12     Appended Document: Request for labs     Phone Note Outgoing Call Call back at Home Phone [phone]   Call placed by: [name],  January 27, 2010 2:20 PM Call placed to: Patient Summary of Call: I Called patient to inform her Dr.Mead Slane would like to refer her to a hematologist and then she said before when she had anemia she was rx'ed an iron supplement and tolerated it well. Patient would like to know if Dr.Emrie Gayle would rx an iron supplement first and then if that doesnt work refer to hematologist.   Dr.Mcarthur Ivins please advise./[facility]  January 27, 2010 2:22 PM   Follow-up for Phone Call        Slo iron 150  two times a day #60 or whichever brand was tolerated; 11/2008 entry states ferrous sulfate 325 mg bid Follow-up by: Unice Cobble MD,  January 27, 2010 6:14 PM  Additional Follow-up for Phone Call Additional follow up Details #1::        Patient aware RX for iron sent to pharmacy and Anemia will be followed by GI  Dr.  Marland KitchenReferral for Hematology removed** Additional Follow-up by: [name],  January 28, 2010 1:43 PM    New/Updated Medications: SLOW FE 160 (50 FE) MG CR-TABS (FERROUS SULFATE DRIED) 1 by mouth two times a day Prescriptions: SLOW FE 160 (50 FE) MG CR-TABS (FERROUS SULFATE DRIED) 1 by mouth two times a day  #60 x 1   Entered by:   [name]   Authorized by:   Unice Cobble MD   Signed by:   [name] on 01/28/2010   Method used:   Electronically to        [facility]* (retail)       [address]       [place], [place]  [address]       Ph: [phone]       Fax: [phone]   RxID:   [id number]

## 2010-09-20 NOTE — Assessment & Plan Note (Signed)
Summary: consult for osa   Referred by:  Unice Cobble PCP:  Unice Cobble  Chief Complaint:  Sleep Consult.  History of Present Illness: the patient is a 60 year old female who I have been asked to see for obstructive sleep apnea.  The patient was diagnosed with severe sleep apnea approximately 3 years ago to, but could not tolerate wearing a CPAP device.  By split-night criteria, she was found to have an AHI of 119 events/hr, and desaturation to 83%.  She ultimately was placed on CPAP at 10 cm with adequate control.  The patient believes that her CPAP failure was related to a poorly fitting mask.  She did not believe that pressure was an issue.  She had difficulties with mouth opening, and a chin strap did not work for her.  She could not find a full face mask at fitting appropriately.  Currently, the patient has been told that she has snoring and pauses in her breathing during sleep.  She typically goes to bed between 1030 and 12 midnight, and gets up anywhere between 530 to 8 a.m.Marland Kitchen  She is not rested in the mornings upon arising, and has noted sleep pressure at work with periods of inactivity.  The patient works as an Mining engineer, and has noted sleepiness while feeding the baby.  She has some dozing watching television or movies.  She denies any issues with driving, but does not drive longer distances.  Her weight is neutral over the last two years.     Current Allergies: ! CODEINE  Past Medical History:    Reviewed history from 01/01/2008 and no changes required:       anemia-recurrent iron deficiency, low normal B12 level.       sleep apnea/ intolerant to CPAP       Allergic rhinitis       GERD       Hypertension       osteoarthritis       Depression       Diverticulosis   Family History:    Reviewed history from 01/01/2008 and no changes required:       mother htn, hysterectomy for fibroids, peripheral neuropathy       father cva, lung cancer, pre  cancerous skin  lesions, arthriits       maternal grandfather diabetes       maternal uncle diabetes       maternal aunt diabetes       No gastrointestinal malignancy or history of anemia  Social History:    Reviewed history from 01/01/2008 and no changes required:       Never Smoked       Alcohol use-yes occasional       Weight watchers       Occupation: neonatal intensive care nurse at [employer]       pt is divorced.        pt does not have any children.   Risk Factors: Tobacco use:  never Alcohol use:  yes    Type:  rarely Exercise:  yes    Type:  walks 3X/ week 1.5 mpd w/o symptoms  Colonoscopy History:    Date of Last Colonoscopy:  02/23/2006   Review of Systems      See HPI   Vital Signs:  Patient Profile:   60 Years Old Female Height:     64 inches Weight:      202.50 pounds O2 Sat:  99 % O2 treatment:    Room Air Temp:     97.5 degrees F oral Pulse rate:   64 / minute BP sitting:   122 / 80  (left arm) Cuff size:   regular  Vitals Entered By: Valrie Hart LPN (May 13, 579FGE 624THL AM)             Is Patient Diabetic? No Comments Medications reviewed with patient  Valrie Hart LPN  May 13, 579FGE [id number] AM      Physical Exam  General:     obese female in no acute distress Eyes:     PERRLA and EOMI.   Nose:     mild septal deviation to the left Mouth:     significant elongation of the soft palate and uvula Neck:     no JVD, thyromegaly, or lymphadenopathy. Lungs:     totally clear to auscultation Heart:     regular rate and rhythm, no MRG Abdomen:     soft and nontender, bowel sounds present Extremities:     no significant edema, pulses intact distally Neurologic:     alert and oriented, moves all 4 extremities.     Impression & Recommendations:  Problem # 1:  SLEEP APNEA (ICD-780.57) the patient has a history of very severe obstructive sleep apnea.  I have had a long discussion with her about this, including its impact on  her quality of life and long-term cardiovascular health.  I really think she needs very aggressive treatment given the severity of her sleep apnea.  She is willing to try CPAP again, and I will work with her to help with troubleshooting.  This I also urged her to work aggressively on weight loss.  Will go ahead and set the patient out with a full face mask after fitting at the sleep Center.  Will then start her on CPAP at 10 cm of water pressure, and see how she responds.   Patient Instructions: 1)  will start cpap 2)  please call if issues 3)  work on weight loss 4)  f/u in 4 weeks    ]

## 2010-09-20 NOTE — Consult Note (Signed)
Summary: [place]  [facility]   Imported By: Phillis Knack 02/17/2010 07:05:22  _____________________________________________________________________  External Attachment:    Type:   Image     Comment:   External Document

## 2010-09-20 NOTE — Progress Notes (Signed)
Summary: RESULTS  Phone Note Call from Patient Call back at Home Phone [phone] Call back at C# [phone]   Caller: Patient Call For: PERRY Reason for Call: Lab or Test Results Details for Reason: results Summary of Call: had CAP ENDO on 7-14 would like results Initial call taken by: Quenton Fetter [facility],  April 02, 2008 9:45 AM  Follow-up for Phone Call        pt informed that she will be called as soon as results of capsule are available Abel Presto RN  April 02, 2008 11:39 AM

## 2010-09-20 NOTE — Assessment & Plan Note (Signed)
Summary: med refill/cbs   Vital Signs:  Patient profile:   60 year old female Height:      63.25 inches Weight:      212.2 pounds BMI:     37.43 Temp:     98.9 degrees F oral Pulse rate:   76 / minute Resp:     14 per minute BP sitting:   128 / 80  (left arm) Cuff size:   large  Vitals Entered By: Georgette Dover (Jan 10, 2010 2:41 PM) CC: CPX  Comments REVIEWED MED LIST, PATIENT AGREED DOSE AND INSTRUCTION CORRECT    Primary Care Provider:  Unice Cobble  CC:  CPX .  History of Present Illness: [name] is here for a physical; she is essentially asymptomatic except for intermittent OA joint pain in back & hands. She wore external support & took NSAIDS for a torn meniscus R knee in 11-07/2009.  Preventive Screening-Counseling & Management  Caffeine-Diet-Exercise     Does Patient Exercise: yes  Allergies: 1)  ! Codeine  Past History:  Past Medical History: anemia:recurrent iron deficiency, low normal B12 level. Sleep Apnea, CPAP, Dr Gwenette Greet Allergic rhinitis GERD Hypertension Osteoarthritis; DDD  Depression, PMH of ,Dr Toy Care (Situational , divorce , loss of parents) Diverticulosis Hyperlipidemia  Past Surgical History: Colonoscopy; Endo : [facility] , Dr Scarlette Shorts; G0 P0, Dr Davy Pique  Family History: mother: htn, hysterectomy for fibroids, peripheral neuropathy father : cva, lung cancer, pre cancerous skin  lesions, arthriits maternal grandfather: diabetes maternal uncle : diabetes maternal aunt : diabetes No gastrointestinal malignancy or history of anemia; P aunt: breast cancer; P aunt: leukemia; MGF: MI in 39s  Social History: Never Smoked Alcohol use-yes : rarely [employer] Occupation: Naval architect at [employer] Regular exercise-yes: Treadmill 4X/week for 30 minutes   Review of Systems General:  Complains of sleep disorder; denies fatigue; Sleep Apnea controlled with CPAP. Eyes:  Denies blurring, double vision, and  vision loss-both eyes. ENT:  Denies difficulty swallowing and hoarseness. CV:  Denies chest pain or discomfort, leg cramps with exertion, palpitations, shortness of breath with exertion, swelling of feet, and swelling of hands. Resp:  Denies cough, excessive snoring, hypersomnolence, morning headaches, and sputum productive. GI:  Denies abdominal pain, bloody stools, dark tarry stools, and indigestion; No dysphagia. GU:  Denies discharge, dysuria, and hematuria; Due for Gyn appt. MS:  Complains of joint pain, low back pain, and cramps; denies joint redness, joint swelling, mid back pain, and thoracic pain. Derm:  Denies changes in nail beds, dryness, hair loss, and lesion(s). Neuro:  Denies numbness and tingling; Burning pain RUE occasionally. Psych:  Denies anxiety, depression, easily angered, easily tearful, and irritability. Endo:  Denies cold intolerance, excessive hunger, excessive thirst, excessive urination, and heat intolerance. Heme:  Denies abnormal bruising and bleeding. Allergy:  Complains of itching eyes, seasonal allergies, and sneezing; Mild symptoms; generic Claritin helps.  Physical Exam  General:  well-nourished, alert,appropriate and cooperative throughout examination Head:  Normocephalic and atraumatic without obvious abnormalities. No apparent alopecia  Eyes:  No corneal or conjunctival inflammation noted. Perrla. Funduscopic exam benign, without hemorrhages, exudates or papilledema. Ears:  External ear exam shows no significant lesions or deformities.  Otoscopic examination reveals clear canals, tympanic membranes are intact bilaterally without bulging, retraction, inflammation or discharge. Hearing is grossly normal bilaterally. Nose:  External nasal examination shows no deformity or inflammation. Nasal mucosa are pink and moist without lesions or exudates. Mouth:  Oral mucosa and oropharynx without  lesions or exudates.  Teeth in good repair. Neck:  No deformities,  masses, or tenderness noted. Lungs:  Normal respiratory effort, chest expands symmetrically. Lungs are clear to auscultation, no crackles or wheezes. Heart:  normal rate, regular rhythm, no gallop, no rub, no JVD, no HJR, and [id number]   /6 systolic murmu R baser.   Abdomen:  Bowel sounds positive,abdomen soft and non-tender without masses, organomegaly or hernias noted. Genitalia:  Dr Marvia Pickles Msk:  No deformity or scoliosis noted of thoracic or lumbar spine.   Pulses:  R and L carotid,radial  and posterior tibial pulses are full and equal bilaterally. Decreased DPP  Extremities:  No clubbing, cyanosis, edema noted with normal full range of motion of all joints.  Crepitus of knees. Deformed R thumbnail Neurologic:  alert & oriented X3 and DTRs symmetrical and normal.   Skin:  Small , flat salmon colored plaques of extremeties Cervical Nodes:  No lymphadenopathy noted Axillary Nodes:  No palpable lymphadenopathy Psych:  memory intact for recent and remote, normally interactive, and good eye contact.     Impression & Recommendations:  Problem # 1:  ROUTINE GENERAL MEDICAL EXAM@HEALTH  CARE FACL (ICD-V70.0)  Orders: EKG w/ Interpretation (93000)  Problem # 2:  HYPERTENSION (ICD-401.9)  controlled Her updated medication list for this problem includes:    Amlodipine Besylate 5 Mg Tabs (Amlodipine besylate) .Marland Kitchen... 1 by mouth once daily    Lisinopril 20 Mg Tabs (Lisinopril) .Marland Kitchen... 1 by mouth once daily  Orders: EKG w/ Interpretation (93000)  Problem # 3:  IRON DEFICIENCY ANEMIA SECONDARY TO BLOOD LOSS (ICD-280.0) PMH of  Problem # 4:  PAIN IN JOINT, SITE UNSPECIFIED (ICD-719.40)  Problem # 5:  SLEEP APNEA (ICD-780.57) controlled with CPAP  Problem # 6:  CARDIAC MURMUR, AORTIC (ICD-785.2) SBE Prophylaxis recommended  Problem # 7:  POSTMENOPAUSAL SYNDROME (ICD-627.9)  Complete Medication List: 1)  Amlodipine Besylate 5 Mg Tabs (Amlodipine besylate) .Marland Kitchen.. 1 by mouth once daily 2)   Lisinopril 20 Mg Tabs (Lisinopril) .Marland Kitchen.. 1 by mouth once daily 3)  Ferrous Sulfate 325mg   .... Take one capsule twice daily. 4)  Multivitamins Tabs (Multiple vitamin) .... Once daily 5)  Sm Calcium 500 Mg Tabs (Oyster shell) .... Two times a day 6)  Nexium 40 Mg Cpdr (Esomeprazole magnesium) .Marland Kitchen.. 1 once daily as needed 7)  Meloxicam 7.5 Mg Tabs (Meloxicam) .Marland Kitchen.. 1 two times a day as needed joint pain  Patient Instructions: 1)  Please scedule fasting labs @ [facility]: Vitamin D level;BMP ;Hepatic Panel ;Lipid Panel ;TSH ;CBC w/ Diff; iron panel .See Diagnoses for Codes. Prescriptions: MELOXICAM 7.5 MG TABS (MELOXICAM) 1 two times a day as needed joint pain  #30 x 1   Entered and Authorized by:   Unice Cobble MD   Signed by:   Unice Cobble MD on 01/10/2010   Method used:   Faxed to ...       [facility] (retail)       [address], [place]  [address]       Ph: [phone]       Fax: [phone]   RxID:   [id number] NEXIUM 40 MG CPDR (ESOMEPRAZOLE MAGNESIUM) 1 once daily as needed  #90 x 1   Entered and Authorized by:   Unice Cobble MD   Signed by:   Unice Cobble MD on 01/10/2010   Method used:  Faxed to ...       [facility] (retail)       [address], [place]  [address]       Ph: [phone]       Fax: [phone]   RxID:   [id number] LISINOPRIL 20 MG  TABS (LISINOPRIL) 1 by mouth once daily  #90 x 3   Entered and Authorized by:   Unice Cobble MD   Signed by:   Unice Cobble MD on 01/10/2010   Method used:   Faxed to ...       [facility] (retail)       [address], [place]  [address]       Ph: [phone]       Fax: [phone]   RxID:   [id number] AMLODIPINE BESYLATE 5 MG  TABS (AMLODIPINE BESYLATE) 1 by mouth once daily  #90 x 3    Entered and Authorized by:   Unice Cobble MD   Signed by:   Unice Cobble MD on 01/10/2010   Method used:   Faxed to ...       [facility] (retail)       [address]       [place], [place]  [address]       Ph: [phone]       Fax: [phone]   RxID:   [id number]

## 2010-09-20 NOTE — Letter (Signed)
Summary: Results Follow up Letter  [facility]   [place], [place] [address]   Phone: [phone]  Fax: [phone]    02/01/2010 MRN: [id number]  [facility] [place], [place]  [address]  Dear Ms. Yellen,  The following are the results of your recent test(s):  Test         Result    Pap Smear:        Normal _____  Not Normal _____ Comments: ______________________________________________________ Cholesterol: LDL(Bad cholesterol):         Your goal is less than:         HDL (Good cholesterol):       Your goal is more than: Comments:  ______________________________________________________ Mammogram:        Normal _____  Not Normal _____ Comments:  ___________________________________________________________________ Hemoccult:        Normal __X___  Not normal _______ Comments:    _____________________________________________________________________ Other Tests:    We routinely do not discuss normal results over the telephone.  If you desire a copy of the results, or you have any questions about this information we can discuss them at your next office visit.   Sincerely,

## 2010-09-20 NOTE — Assessment & Plan Note (Signed)
Summary: cpx & lab.cbs   Vital Signs:  Patient Profile:   60 Years Old Female Weight:      199.38 pounds Pulse rate:   60 / minute Pulse rhythm:   regular Resp:     14 per minute BP sitting:   128 / 80  (left arm) Cuff size:   large  Pt. in pain?   no  Vitals Entered By: Janelle Floor (December 03, 2007 9:50 AM)                   Chief Complaint:  CPX.  General Medical HPI:        Currently she is doing well without any significant new complaints.    Current Allergies: ! CODEINE  Past Medical History:    anemia    sleep apnea/ intolerant to CPAP    Allergic rhinitis    GERD  Past Surgical History:    gravid 0 para 0    colonoscopy , tics 12/2002    colonoscopy , tics for anemia positive fecal ocult blood 02/2006   Family History:    mother htn, hysterectomy for fibroids, peripheral neuropathy    father cva, lung cancer, pre cancerous skin  lesions, arthriits    maternal grandfather diabetes    maternal uncle diabetes    maternal aunt diabetes  Social History:    Reviewed history and no changes required:       Never Smoked       Alcohol use-yes occasional       Weight watchers   Risk Factors:  Tobacco use:  never Alcohol use:  yes    Type:  rarely Exercise:  yes    Type:  walks 3X/ week 1.5 mpd w/o symptoms   Review of Systems  General      Denies chills, fatigue, fever, sweats, and weight loss.  Eyes      Denies blurring, double vision, and vision loss-both eyes.  ENT      Denies decreased hearing, difficulty swallowing, earache, nasal congestion, ringing in ears, and sinus pressure.      Deviated septum; RTI X 2 this Winter  CV      Denies bluish discoloration of lips or nails, chest pain or discomfort, difficulty breathing at night, difficulty breathing while lying down, leg cramps with exertion, palpitations, shortness of breath with exertion, swelling of feet, and swelling of hands.  Resp      Complains of hypersomnolence.       Denies cough, excessive snoring, morning headaches, shortness of breath, and sputum productive.  GI      Denies abdominal pain, bloody stools, change in bowel habits, constipation, dark tarry stools, diarrhea, indigestion, nausea, and vomiting.  GU      Denies dysuria, hematuria, and urinary frequency.      Nocturia X 3-4  MS      Complains of joint pain and low back pain.      Denies joint redness, joint swelling, loss of strength, mid back pain, muscle aches, muscle , cramps, muscle weakness, stiffness, and thoracic pain.      Pain R hand recent onset; as needed Advil for back  Derm      Complains of lesion(s).      Denies changes in color of skin, changes in nail beds, dryness, excessive perspiration, flushing, hair loss, itching, poor wound healing, and rash.      Lesion L chest 2 weeks  Neuro      Denies difficulty with  concentration, disturbances in coordination, falling down, headaches, memory loss, numbness, poor balance, sensation of room spinning, and tingling.      Isolated fall 1 week ago w/o sequellae  Psych      Denies anxiety, depression, easily angered, easily tearful, and irritability.  Endo      Denies cold intolerance, excessive hunger, excessive thirst, excessive urination, heat intolerance, polyuria, and weight change.  Heme      Denies abnormal bruising and bleeding.  Allergy      Complains of itching eyes and seasonal allergies.      Denies sneezing.   Physical Exam  General:     Well-developed,well-nourished,in no acute distress; alert,appropriate and cooperative throughout examination Head:     Normocephalic and atraumatic without obvious abnormalities. No apparent alopecia or balding. Eyes:     No corneal or conjunctival inflammation noted. EOMI. Perrla. Funduscopic exam benign, without hemorrhages, exudates or papilledema. Vision grossly normal. Ears:     External ear exam shows no significant lesions or deformities.  Otoscopic examination  reveals clear canals, tympanic membranes are intact bilaterally without bulging, retraction, inflammation or discharge. Hearing is grossly normal bilaterally. Nose:     External nasal examination shows no deformity or inflammation. Nasal mucosa are mildly erythematous; setal dislocation without lesions or exudates. Mouth:     Oral mucosa and oropharynx without lesions or exudates.  Teeth in good repair. Neck:     No deformities, masses, or tenderness noted. Lungs:     Normal respiratory effort, chest expands symmetrically. Lungs are clear to auscultation, no crackles or wheezes. Heart:     Normal rate and regular rhythm. S1 and S2 normal without gallop, murmur, click, rub . S4 with slurring. Abdomen:     Bowel sounds positive,abdomen soft and non-tender without masses, organomegaly or hernias noted. Msk:     No deformity or scoliosis noted of thoracic or lumbar spine.   Pulses:     R and L carotid,radial,dorsalis pedis and posterior tibial pulses are full and equal bilaterally Extremities:     No clubbing, cyanosis, edema, or deformity noted with normal full range of motion of all joints. Mild crepitus knees   Neurologic:     alert & oriented X3, strength normal in all extremities, and DTRs symmetrical and normal.   Skin:     Scaley lesion L chest Cervical Nodes:     No lymphadenopathy noted Axillary Nodes:     No palpable lymphadenopathy Psych:     memory intact for recent and remote, normally interactive, good eye contact, not anxious appearing, and not depressed appearing.      Impression & Recommendations:  Problem # 1:  ROUTINE GENERAL MEDICAL EXAM@HEALTH  CARE FACL (ICD-V70.0)  Orders: EKG w/ Interpretation (93000) TLB-Lipid Panel (80061-LIPID) TLB-BMP (Basic Metabolic Panel-BMET) (99991111) TLB-CBC Platelet - w/Differential (85025-CBCD) TLB-Hepatic/Liver Function Pnl (80076-HEPATIC) TLB-TSH (Thyroid Stimulating Hormone) (84443-TSH) TLB-Uric Acid, Blood  (84550-URIC) TLB-Rheumatoid Factor (RA) (86430-RA) TLB-Sedimentation Rate (ESR) (85651-ESR) TLB-A1C / Hgb A1C (Glycohemoglobin) (83036-A1C) Sleep Disorder Referral (Sleep Disorder)   Problem # 2:  GERD (ICD-530.81)  Her updated medication list for this problem includes:    Omeprazole 20 Mg Cpdr (Omeprazole) .Marland Kitchen... 1 by mouth q am as needed   Problem # 3:  ALLERGIC RHINITIS (ICD-477.9)  Problem # 4:  PAIN IN JOINT, SITE UNSPECIFIED (ICD-719.40)  Orders: TLB-Uric Acid, Blood (84550-URIC) TLB-Rheumatoid Factor (RA) (86430-RA) TLB-Sedimentation Rate (ESR) (85651-ESR)   Problem # 5:  OTHER AND UNSPECIFIED HYPERLIPIDEMIA (ICD-272.4)  Orders: TLB-Lipid Panel (80061-LIPID)  Problem # 6:  OTHER ABNORMAL BLOOD CHEMISTRY (ICD-790.6)  Orders: TLB-A1C / Hgb A1C (Glycohemoglobin) (83036-A1C)   Problem # 7:  SLEEP APNEA (ICD-780.57)  Orders: Sleep Disorder Referral (Sleep Disorder)   Complete Medication List: 1)  Omeprazole 20 Mg Cpdr (Omeprazole) .Marland Kitchen.. 1 by mouth q am as needed   Patient Instructions: 1)  Complete stool cards    Prescriptions: OMEPRAZOLE 20 MG  CPDR (OMEPRAZOLE) 1 by mouth q am as needed  #90 x 1   Entered and Authorized by:   Unice Cobble MD   Signed by:   Unice Cobble MD on 12/03/2007   Method used:   Print then Give to Patient   RxID:   [id number]  ]  Tetanus/Td Immunization History:    Tetanus/Td # 1:  Tdap (04/25/2005)   Appended Document: cpx & lab.cbs LDL goal = < 105 based on NMR

## 2010-09-20 NOTE — Progress Notes (Signed)
Summary: cpap/ pressure - pt has tried your recommendations  Phone Note Call from Patient   Caller: Patient Call For: clance Summary of Call: pt having difficulties w/ cpap. pressure behind eye/ stuffy nose. wants some suggestions. call pt at work- [employer]. [phone]. Initial call taken by: Cooper Render,  Jan 14, 2008 11:31 AM  Follow-up for Phone Call        spoke with pt. pt c/o "cpap uncomfortable."  Pt c/o waking up with headaches and sinus pressure causing R eye to hurt.  Pt states she has been using saline spray and neti pop with no relief. pt also c/o mouth dry-making it difficult to breath with cpap. pt states DME currently put pt on a setting of 10 for a week and then will reset cpap to 12 next week.   please advise what you want to do. thank you. Valrie Hart LPN  May 26, [id number] PM   Additional Follow-up for Phone Call Additional follow up Details #1::        find out if she is using humidifier..sounds like she is over-dry.  Can increase temp on heater for humidifier to increase moisture.  If that doesn't work, then mask may not be fitted right, and may be pulled way too tight.  can loosen just a little..if mask leaks, needs to be refitted.  Let us know how things go. Additional Follow-up by: Kathee Delton MD,  Jan 14, 2008 2:50 PM    Additional Follow-up for Phone Call Additional follow up Details #2::    slpt states that she has tried all of the recommendations given by Dr. Gwenette Greet; states that she has increased the temp, and loosened the mask.  she stated that she constantly wakes up with a headache, pain behind her right eye, dry mouth.  believes this may be related to her sinuses; uses saline, netti pot, breathe-right strips and drinks water before going to bed.  states that she is EXHAUSTED and asked if [facility] usually gives meds for sleep.  told pt that he does not prefer to prescribe sleep meds, but that I would ask.  pt then became agitated and rude, and stated that  it was "an inquiry, not a request."  pls advise. Parke Poisson CNA  Jan 14, 2008 3:49 PM   Additional Follow-up for Phone Call Additional follow up Details #3:: Details for Additional Follow-up Action Taken: pt needs ov to discuss.  she has a long history of cpap intolerance, and this may not be a viable therapy for her   pt will call tomorrow to schedule an appt when she can give [place] dates that she is avaliable. Ramiro Harvest CMA  Jan 15, 2008 5:15 PM  Additional Follow-up by: Kathee Delton MD,  Jan 15, 2008 5:03 PM

## 2010-09-20 NOTE — Procedures (Signed)
Summary: Colonoscopy   Colonoscopy  Procedure date:  02/23/2006  Findings:      Location:  [facility].    Procedures Next Due Date:    Colonoscopy: 02/2016 Patient Name: [name], [name] MRN:  Procedure Procedures: Colonoscopy CPT: [id number].  Personnel: Endoscopist: Docia Chuck. Henrene Pastor, MD.  Referred By: Darrick Penna. Linna Darner, MD.  Exam Location: Exam performed in Outpatient Clinic. Outpatient  Patient Consent: Procedure, Alternatives, Risks and Benefits discussed, consent obtained, from patient. Consent was obtained by the RN.  Indications  Evaluation of: Anemia Microcytic. Positive fecal occult blood test per home screening.  History  Current Medications: Patient is not currently taking Coumadin.  Pre-Exam Physical: Performed Feb 23, 2006. Cardio-pulmonary exam, Rectal exam, HEENT exam , Abdominal exam, Mental status exam WNL.  Comments: Pt. history reviewed/updated, physical exam performed prior to initiation of sedation?yes Exam Exam: Extent of exam reached: Terminal Ileum, extent intended: Terminal Ileum.  The cecum was identified by appendiceal orifice and IC valve. Patient position: on left side. Time to Cecum: 00:04:57. Time for Withdrawl: 00:12:44. Colon retroflexion performed. Images taken. ASA Classification: II. Tolerance: excellent.  Monitoring: Pulse and BP monitoring, Oximetry used. Supplemental O2 given.  Colon Prep Used osmo prep for colon prep. Prep results: excellent.  Sedation Meds: Patient assessed and found to be appropriate for moderate (conscious) sedation. Fentanyl 100 mcg. given IV. Versed 10 mg. given IV.  Findings NORMAL EXAM: Cecum to Rectum.  - NORMAL EXAM: Ileum.  - DIVERTICULOSIS: Sigmoid Colon. ICD9: Diverticulosis, Colon: 562.10.   Assessment  Diagnoses: 562.10: Diverticulosis, Colon.   Comments: NO POLYPS OR OTHER MUCOSAL LESIONS Events  Unplanned Interventions: No intervention was required.  Unplanned  Events: There were no complications. Plans Disposition: After procedure patient sent to recovery. After recovery patient sent home.  Scheduling/Referral: Colonoscopy, to Docia Chuck. Henrene Pastor, MD, IN 10 YEARS,    CC: Unice Cobble, MD     Elyse Hsu, MD     The Patient  This report was created from the original endoscopy report, which was reviewed and signed by the above listed endoscopist.

## 2010-09-20 NOTE — Progress Notes (Signed)
Summary: rx  Phone Note Refill Request   Refills Requested: Medication #1:  NEXIUM 40 MG CPDR 1 once daily as needed. [facility] [phone]   fax---[phone]  Initial call taken by: Despina Arias,  August 18, 2009 2:22 PM    Prescriptions: NEXIUM 40 MG CPDR (ESOMEPRAZOLE MAGNESIUM) 1 once daily as needed  #90 x 1   Entered by:   Georgette Dover   Authorized by:   Unice Cobble MD   Signed by:   Georgette Dover on 08/18/2009   Method used:   Electronically to        [facility] (retail)       [address]       [place], [place]  [address]       Ph: [phone]       Fax: [phone]   RxID:   [id number]

## 2010-09-20 NOTE — Progress Notes (Signed)
Summary: RESULTS  Phone Note Call from Patient Call back at Home Phone [phone] Call back at [phone]   Caller: Patient Call For: PERRY Reason for Call: Talk to Nurse Details for Reason: RESULTS Summary of Call: PT WOULD LIKE LAB RESULTS DRAWN LAST WEEK. [id number] PERRY NURSE AREA Initial call taken by: Quenton Fetter [facility],  Jan 07, 2008 11:41 AM  Follow-up for Phone Call        left message to call back [place]  Jan 07, 2008 4:54 PM   pt cb and she was informed  .sign of lab results results asking if she really needs capsule since hgb came up from 8 to9.8in 3 weeks with iron two times a day. CL  Follow-up by: Abel Presto RN,  Jan 07, 2008 1:08 PM  Additional Follow-up for Phone Call Additional follow up Details #1::        Yes. We still want to know why she became anemic. Irene Shipper MD  Jan 07, 2008 5:28 PM     Additional Follow-up for Phone Call Additional follow up Details #2::    pt ntf that still needs capsule to find why she became anemic per dr. Henrene Pastor.she will keep appt for test Follow-up by: Abel Presto RN,  Jan 08, 2008 11:18 AM

## 2010-09-20 NOTE — Medication Information (Signed)
Summary: Letter Regarding Nexium & Anemia/[facility]  Letter Regarding Nexium & Anemia/[facility]   Imported By: Edmonia James 04/05/2010 09:08:10  _____________________________________________________________________  External Attachment:    Type:   Image     Comment:   External Document

## 2010-09-20 NOTE — Assessment & Plan Note (Signed)
Summary: CAPSULE ENDO/280.0/PP  Patient here today for capsule endoscopy for Dr. Henrene Pastor .  Pt verbalized understanding of all verbal and written instructions.  Pt tolerated well.  Lot # [id number]    SB2  exp 02/2009 . Had pt lay on RT side for 30 minutes before going home.  [place]

## 2010-09-20 NOTE — Letter (Signed)
Summary: Primary Care Consult Scheduled Letter  [facility] [place]   [place], [place] [address]   Phone: [phone]  Fax: [phone]      12/03/2007 MRN: [id number]  [name] [address] [place], [place]  [address]    Dear Ms. Kolarik,      We have scheduled an appointment for you.  At the recommendation of Dr.HOPPER, we have scheduled you a consult with DR Westminster on 04.29.09 @ 1:30 CK IN 1:15.  Their phone number is [phone].  If this appointment day and time is not convenient for you, please feel free to call the office of the doctor you are being referred to at the number listed above and reschedule the appointment.     It is important for you to keep your scheduled appointments. We are here to make sure you are given good patient care. If yu have questions or you have made changes to your appointment, please notify us at  [phone], ask for [facility].    Thank you,  Patient Care Coordinator [place] at [facility]

## 2010-09-20 NOTE — Progress Notes (Signed)
  Phone Note From Pharmacy   Summary of Call: [facility] called and they no longer have the vitelle, manufacturer does not make any longer.  Per Dr. Etter Sjogren, change to Niferex 150-50 1 by mouth two times a day, rx sent in electronically and patient aware of change. ...................................................................Carley Hammed  December 12, 2007 2:03 PM  Initial call taken by: Carley Hammed,  December 12, 2007 2:03 PM

## 2010-09-20 NOTE — Assessment & Plan Note (Signed)
Summary: f/u osa   Referred by:  Unice Cobble PCP:  Unice Cobble  Chief Complaint:  Follow up.  Pt states cpap pressure was increased from 10 to 12.  Pt c/o mask "irritating face."  Pt c/o hard time adjusting to cpap machine.  Pt states she is using the humidifer..  History of Present Illness: patient comes in today for follow-up of her obstructive sleep apnea.  She has been tolerating the pressure on her CPAP well, but continues to have mask difficulties.  She initially had problems with mouth opening, but now feels this has improved.  She continues to believe the mask is irritating her skin, and would like to consider using nasal pillows.  She also is having a lot of anxiety about the use of CPAP at night.  This is resulting in some mild insomnia.     Current Allergies: ! CODEINE     Review of Systems      See HPI   Vital Signs:  Patient Profile:   60 Years Old Female Height:     64 inches Weight:      205 pounds O2 Sat:      95 % O2 treatment:    Room Air Temp:     98.7 degrees F oral Pulse rate:   76 / minute BP sitting:   130 / 82  (right arm) Cuff size:   regular  Vitals Entered By: Valrie Hart LPN (June 10, 579FGE [id number] AM)             Comments Medications reviewed with patient Valrie Hart LPN  June 10, 579FGE [id number] AM      Physical Exam  General:     obese female in no acute distress Nose:     new skin breakdown or pressure necrosis from the CPAP mask     Impression & Recommendations:  Problem # 1:  SLEEP APNEA (ICD-780.57) the patient continues to have mask difficulties, but at least she is tolerating the pressure.  I think we have to continue working on desensitization, and I will also prescribe a sedative hypnotic short term to help her with sleep onset and mask tolerance.  The patient wishes to try nasal pillows in order to minimize skin contact with the silicone seals, but I have cautioned her about mouth opening with this mask type.  She will  let me know if it becomes an issue.  The one good thing in all of this, is the pt's willingness to continue working with the device.  Medications Added to Medication List This Visit: 1)  Ambien Cr 12.5 Mg Tbcr (Zolpidem tartrate) .... One by mouth at bedtime   Patient Instructions: 1)  will try nasal pillows 2)  continue to work on weight loss 3)  trial of ambien at bedtime for next 10days 4)  f/u with me in 4mos.   Prescriptions: AMBIEN CR 12.5 MG  TBCR (ZOLPIDEM TARTRATE) One by mouth at bedtime  #10 x 0   Entered and Authorized by:   Kathee Delton MD   Signed by:   Kathee Delton MD on 01/29/2008   Method used:   Print then Give to Patient   RxID:   [id number]  ]

## 2010-09-20 NOTE — Procedures (Signed)
Summary: Scheduling Forms/[place]  Scheduling Forms/[facility]   Imported By: Susy Manor 01/07/2008 08:43:37  _____________________________________________________________________  External Attachment:    Type:   Image     Comment:   External Document

## 2010-09-20 NOTE — Miscellaneous (Signed)
Summary: Orders Update  Clinical Lists Changes  Problems: Added new problem of UNSPECIFIED IRON DEFICIENCY ANEMIA (ICD-280.9) Orders: Added new Referral order of Gastroenterology Referral (GI) - Signed  Appended Document: Orders Update She is declining GI consult; her CBC will need close monitor

## 2010-09-20 NOTE — Miscellaneous (Signed)
Summary: 280.9,530.81,562.10.Marland KitchenMarland KitchenEM  Clinical Lists Changes       Patient decided to cancel the endoscopy.  Stated that the doctor probably would not find anything that would be the cause of her low Hb.  Pt stated that her hb was 7.5 and with iron supplements, it went up to 8.2. Tried to explain to the patient that the doctor would be trying to find the cause of her severe anemia.  Pt. stated that she would rather go to see a hematologist instead of GI at this time.  I told the patient to see her primary care physician to keep an eye on her Hb. in the meantime.    Patient stated that she would "call us" if she "changed her mind."  Ernestine Conrad, RN  Appended Document: 280.9,530.81,562.10.Marland KitchenMarland KitchenEM Above noted. Patient was CLEARLY informed that she could have underlying GI mucosal lesions (benign or malignant) and work up as outline is what is recommended. I will forward this to her PCP (and referring doctor), Dr. Linna Darner.  Appended Document: 280.9,530.81,562.10.Marland KitchenMarland KitchenEM Janette, please reconsider having the Endoscopy  . It is wise to rule out any GI cause of this dramatic anemia. Hematology evaluation would be indicated if no GI cause is found.[employer]

## 2010-09-20 NOTE — Procedures (Signed)
Summary: EGD   EGD  Procedure date:  02/23/2006  Findings:      Location: [facility]   Patient Name: [name], [name] MRN:  Procedure Procedures: Panendoscopy (EGD) CPT: A5739879.  Personnel: Endoscopist: Docia Chuck. Henrene Pastor, MD.  Referred By: Darrick Penna. Linna Darner, MD.  Exam Location: Exam performed in Outpatient Clinic. Outpatient  Patient Consent: Procedure, Alternatives, Risks and Benefits discussed, consent obtained, from patient. Consent was obtained by the RN.  Indications  Evaluation of: Anemia,  Microcytic. Positive fecal occult blood test per home screening.  History  Current Medications: Patient is not currently taking Coumadin.  Pre-Exam Physical: Performed Feb 23, 2006  Cardio-pulmonary exam, HEENT exam, Abdominal exam, Mental status exam WNL.  Comments: Pt. history reviewed/updated, physical exam performed prior to initiation of sedation?YES Exam Exam Info: Maximum depth of insertion Duodenum, intended Duodenum. Patient position: on left side. Vocal cords visualized. Gastric retroflexion performed. Images taken. ASA Classification: II. Tolerance: excellent.  Sedation Meds: Patient assessed and found to be appropriate for moderate (conscious) sedation. Residual sedation present from prior procedure today. Versed 4 mg. given IV.  Monitoring: BP and pulse monitoring done. Oximetry used. Supplemental O2 given  Findings HIATAL HERNIA: Comments: NO EROSIONS.  STRICTURE / STENOSIS: Stricture in Distal Esophagus.  Constriction: partial. Etiology: benign due to reflux. 36 cm from mouth. Lumen diameter is 16 mm. ICD9: Esophageal Stricture: 530.3. Comment: NO INFLAMMATION OR BARRETT'S.  - Normal: Fundus to Duodenal 2nd Portion. Comments: NO MUCOSAL LESIONS.   Assessment  Diagnoses: 530.3: Esophageal Stricture.  553.3: Hernia, Hiatal.  530.81: GERD.   Events  Unplanned Intervention: No unplanned interventions were required.  Unplanned  Events: There were no complications. Plans Comments: IRON BID Disposition: After procedure patient sent to recovery. After recovery patient sent home.  Scheduling: Call office for appointment, to Docia Chuck. Henrene Pastor, MD, IN ABOUT 6 WEEKS    CC: Unice Cobble, MD     Elyse Hsu, MD     The Patient  This report was created from the original endoscopy report, which was reviewed and signed by the above listed endoscopist.

## 2010-09-20 NOTE — Consult Note (Signed)
Summary: [facility] [place]   Imported By: Edmonia James 05/18/2008 10:43:51  _____________________________________________________________________  External Attachment:    Type:   Image     Comment:   External Document  Appended Document: [facility] Dr Lynann Bologna: multilevel DDD/DJD ,worse @ L4-5

## 2010-09-20 NOTE — Progress Notes (Signed)
Summary: PT REFUSE GI CONSULTATION  Phone Note Call from Patient   Caller: Patient Details for Reason: CALLED PT.Marland KitchenMarland KitchenW/ LAB RESULTS.. Summary of Call: CALLED PT TOLD HER TO BEG A IRON PILL DAILY AND CONTINUE THE B12 --TOLD PT PHYISICAN WANTS  HER TO HAVE A GI CONSULT TO DISCUSS HER ANEMIA AND TO RULE (GI BLEED).******.Marland KitchenPT DOES NOT WANT TO GO TO  THE CONSULTATION W/ THE GI PHYSICAN...SHE  WANTS  TO START THE IRON PILLS AND CHANGE HER DIET...PT SAYS SHE HAS SCHEDULED HER YEARLY EXAM W/ HER GYN FOR NEXT MTH...................................................................Marland KitchenAllen Norris  December 11, 2007 11:02 AM Initial call taken by: Allen Norris,  December 11, 2007 11:02 AM  Follow-up for Phone Call        PATIENT TURNED IN STOOL CARDS YESTERDAY, PATIENT WOULD LIKE TO SEE WHAT THEY SHOW BEFORE CONSIDERING GI REFERRAL ..................................................................Marland KitchenChrae Malloy  December 11, 2007 1:21 PM   Additional Follow-up for Phone Call Additional follow up Details #1::        OK; obtain stool card results Additional Follow-up by: Unice Cobble MD,  December 11, 2007 3:01 PM

## 2010-09-20 NOTE — Assessment & Plan Note (Signed)
Summary: Recurrent iron deficiency anemia   History of Present Illness Visit Type: consult  Primary GI MD: Scarlette Shorts MD Primary Provider: Unice Cobble, MD  Requesting Provider: Unice Cobble, MD  Chief Complaint: Low hgb History of Present Illness:   60 year old female with a history of hypertension, osteoarthritis, obesity, sleep apnea, gastroesophageal reflux disease, diverticulosis, and recurrent iron deficiency anemia. The patient presents today regarding recurrent iron deficiency anemia. She initially underwent colonoscopy in May of 2004 to evaluate anemia and constipation. The examination was negative except for sigmoid diverticulosis. She was seen again in 2007 for anemia and Hemoccult-positive stool. Repeat colonoscopy was performed, including intubation of the terminal ileum. This was again negative except for sigmoid diverticulosis. Upper endoscopy at that time was negative except for an incidental esophageal stricture and a small hernia. She has responded to iron therapy in the past. Anemia returns off on. Hemoccult testing in 2009 was negative. Hemoglobin April 2010 was 11.9. She stopped her iron stating gastric distress. She started to feel tired and developed ice craving. Hemoglobin this May 2011 was 7.5, with MCV of 69 and iron level XVI. She was placed on Slow Fe. Repeat hemoglobin June 7 was 8.8. B12 level normal. Hemoccult testing from June 14 negative x3. She denies any active GI symptoms. She takes Nexium for GERD. She has had weight gain. No obvious bleeding. No NSAIDs. She did have a capsule endoscopy performed July 2009. The exam was a complete study. There was questionable subtle villous blunting and fissuring of the duodenum with a few scattered red spots throughout the more distal small bowel. Testing for celiac sprue with tissue transglutaminase antibody was negative.   GI Review of Systems    Reports acid reflux.      Denies abdominal pain, belching, bloating, chest  pain, dysphagia with liquids, dysphagia with solids, heartburn, loss of appetite, nausea, vomiting, vomiting blood, weight loss, and  weight gain.      Reports diverticulosis.     Denies anal fissure, black tarry stools, change in bowel habit, constipation, diarrhea, fecal incontinence, heme positive stool, hemorrhoids, irritable bowel syndrome, jaundice, light color stool, liver problems, rectal bleeding, and  rectal pain.    Current Medications (verified): 1)  Amlodipine Besylate 5 Mg  Tabs (Amlodipine Besylate) .Marland Kitchen.. 1 By Mouth Once Daily 2)  Lisinopril 20 Mg  Tabs (Lisinopril) .Marland Kitchen.. 1 By Mouth Once Daily 3)  Multivitamins   Tabs (Multiple Vitamin) .... Once Daily 4)  Sm Calcium 500 Mg  Tabs (Oyster Shell) .... Two Times A Day 5)  Nexium 40 Mg Cpdr (Esomeprazole Magnesium) .Marland Kitchen.. 1 Once Daily As Needed 6)  Meloxicam 7.5 Mg Tabs (Meloxicam) .Marland Kitchen.. 1 Two Times A Day As Needed Joint Pain 7)  Slow Fe 142 (45 Fe) Mg Cr-Tabs (Ferrous Sulfate) .Marland Kitchen.. 1 By Mouth Two Times A Day 8)  Vitamin D 1000 Unit Tabs (Cholecalciferol) .... One By Mouth Once Daily  Allergies (verified): 1)  ! Codeine  Past History:  Past Medical History: Reviewed history from 01/10/2010 and no changes required. anemia:recurrent iron deficiency, low normal B12 level. Sleep Apnea, CPAP, Dr Gwenette Greet Allergic rhinitis GERD Hypertension Osteoarthritis; DDD  Depression, PMH of ,Dr Toy Care (Situational , divorce , loss of parents) Diverticulosis Hyperlipidemia  Past Surgical History: Reviewed history from 01/10/2010 and no changes required. Colonoscopy; Endo : [facility] , Dr Scarlette Shorts; G0 P0, Dr Davy Pique  Family History: mother: htn, hysterectomy for fibroids, peripheral neuropathy father : cva, lung cancer, pre cancerous skin  lesions, arthriits maternal grandfather: diabetes maternal uncle : diabetes maternal aunt : diabetes No gastrointestinal malignancy or history of anemia; P aunt: breast cancer; P aunt: leukemia; MGF:  MI in 50s No FH of Colon Cancer:  Social History: Reviewed history from 01/10/2010 and no changes required. Never Smoked Alcohol use-yes : rarely Weight Watchers Occupation: Naval architect at [employer] Regular exercise-yes: Treadmill 4X/week for 30 minutes   Review of Systems       The patient complains of fatigue.  The patient denies allergy/sinus, anemia, anxiety-new, arthritis/joint pain, back pain, blood in urine, breast changes/lumps, change in vision, confusion, cough, coughing up blood, depression-new, fainting, fever, headaches-new, hearing problems, heart murmur, heart rhythm changes, itching, menstrual pain, muscle pains/cramps, night sweats, nosebleeds, pregnancy symptoms, shortness of breath, skin rash, sleeping problems, sore throat, swelling of feet/legs, swollen lymph glands, thirst - excessive , urination - excessive , urination changes/pain, urine leakage, vision changes, and voice change.    Vital Signs:  Patient profile:   60 year old female Height:      63.25 inches Weight:      215 pounds BMI:     37.92 BSA:     2.00 Pulse rate:   76 / minute Pulse rhythm:   regular BP sitting:   126 / 80  (left arm) Cuff size:   large  Vitals Entered By: Hope Pigeon CMA (February 16, 2010 10:37 AM)  Physical Exam  General:  Well developed, well nourished, no acute distress. Head:  Normocephalic and atraumatic. Eyes:  PERRLA, no icterus. Nose:  No deformity, discharge,  or lesions. Mouth:  No deformity or lesions, dentition normal. Neck:  Supple; no masses or thyromegaly. Lungs:  Clear throughout to auscultation. Heart:  Regular rate and rhythm; no murmurs, rubs,  or bruits. Abdomen:  Soft, nontender and nondistended. No masses, hepatosplenomegaly or hernias noted. Normal bowel sounds. Msk:  Symmetrical with no gross deformities. Normal posture. Pulses:  Normal pulses noted. Extremities:  No clubbing, cyanosis, edema or deformities noted. Neurologic:   Alert and  oriented x4;  grossly normal neurologically. Skin:  Intact without significant lesions or rashes. Psych:  Alert and cooperative. Normal mood and affect.   Impression & Recommendations:  Problem # 1:  ANEMIA-IRON DEFICIENCY (ICD-280.9) Recurrent iron deficiency anemia as described above. Previous workups as described above. I am suspicious that she either has an occult small bowel lesion missed on capsule endoscopy or celiac sprue.  Plan: #1. Iron replacement therapy daily indefinitely #2. Upper endoscopy with duodenal biopsies. The nature of the procedure as well as the risks, benefits, and alternatives were reviewed. She understood and agreed to proceed. #3. Repeat capsule endoscopy if EGD and duodenal biopsies unrevealing. The patient wants to make sure that insurance coverage is available. We will look into this for her.  Problem # 2:  GERD (ICD-530.81) Assessment: Comment Only  Problem # 3:  DIVERTICULOSIS OF COLON (ICD-562.10) Assessment: Comment Only  Patient Instructions: 1)  Pt. will call to schedule EGD and pre-visit appt. 2)  Upper Endoscopy brochure given.  3)  Copy sent to :  Unice Cobble, MD 4)  The medication list was reviewed and reconciled.  All changed / newly prescribed medications were explained.  A complete medication list was provided to the patient / caregiver.

## 2010-09-20 NOTE — Letter (Signed)
Summary: Results Follow up Letter  [facility] [place]   [place], [place] [address]   Phone: [phone]  Fax: [phone]    12/29/2008 MRN: [id number]  [name] [place], [place]  [address]  Dear [name],  The following are the results of your recent test(s):  Test         Result    Pap Smear:        Normal _____  Not Normal _____ Comments: ______________________________________________________ Cholesterol: LDL(Bad cholesterol):         Your goal is less than:         HDL (Good cholesterol):       Your goal is more than: Comments:  ______________________________________________________ Mammogram:        Normal _____  Not Normal _____ Comments:  ___________________________________________________________________ Hemoccult:        Normal __X___  Not normal _______ Comments:    _____________________________________________________________________ Other Tests:    We routinely do not discuss normal results over the telephone.  If you desire a copy of the results, or you have any questions about this information we can discuss them at your next office visit.   Sincerely,

## 2010-09-20 NOTE — Progress Notes (Signed)
  Phone Note Call from Patient   Caller: Patient Summary of Call: 02-14-08 Pt returned our call. I scheduled Capsule endo for 03-03-08 @ 8AM.  Pt has instructions from previous appt that got cancelled due to ins problems.  Pam Peterman CMA   New Problems: IRON DEFICIENCY ANEMIA SECONDARY TO BLOOD LOSS (ICD-280.0)   New Problems: IRON DEFICIENCY ANEMIA SECONDARY TO BLOOD LOSS (ICD-280.0)

## 2010-09-20 NOTE — Medication Information (Signed)
[facility]   Imported By: Velora Heckler 12/13/2007 10:31:28  _____________________________________________________________________  External Attachment:    Type:   Image     Comment:   External Document

## 2010-09-20 NOTE — Progress Notes (Signed)
Summary: Capsule Results  Phone Note Call from Patient Call back at work [phone]   Call For: DR [facility] Reason for Call: Lab or Test Results Summary of Call: Very aggravated that she has not been told her results from capsule done in July 14. Initial call taken by: Irwin Brakeman [facility],  April 14, 2008 10:03 AM  Follow-up for Phone Call        PT notified that Dr Henrene Pastor will reveiw results of capsule and we will call her back today. Abel Presto RN  April 14, 2008 10:19 AM  I spoke with the patient today and review the results of her capsule endoscopy in detail. She has not had followup blood work since her office visit. She has been on b.i.d. iron. Please order a CBC and B12 level for the patient. She will stop by the lab to have the blood work drawn.Irene Shipper MD  April 14, 2008 5:14 PM      Additional Follow-up for Phone Call Additional follow up Details #2::    lab work ordered in idx per order of Dr Lavera Guise RN  April 14, 2008 5:19 PM

## 2010-09-20 NOTE — Progress Notes (Signed)
Summary: NEED ORDER FOR BONE DENSITY  Phone Note From Other Clinic   Caller: [place] --[facility] Summary of Call: Dustin  FAX # [phone] PT SAID  DR OFFICE LEFT HER SEVERAL MSG TO SCHEDULE THIS Initial call taken by: Verdie Mosher,  March 09, 2009 11:08 AM  New Problems: POSTMENOPAUSAL SYNDROME (ICD-627.9)   New Problems: POSTMENOPAUSAL SYNDROME (ICD-627.9)

## 2010-09-20 NOTE — Letter (Signed)
Summary: Primary Care Consult Scheduled Letter  [facility] [place]   [place], [place] [address]   Phone: [phone]  Fax: [phone]      12/11/2007 MRN: [id number]  ROMANITA MACCRACKEN [address] [place], [place]  [address]    Dear Ms. Medeiros,      We have scheduled an appointment for you.  At the recommendation of Dr.Hopper, we have scheduled you a consult with Dr. Henrene Pastor on May 11 at 9:45.  Their phone number is [phone].  If this appointment day and time is not convenient for you, please feel free to call the office of the doctor you are being referred to at the number listed above and reschedule the appointment.     It is important for you to keep your scheduled appointments. We are here to make sure you are given good patient care. If you have questions or you have made changes to your appointment, please notify us at  [phone], ask for [place]iffany.    Thank you,  Patient Care Coordinator [place] at [facility]

## 2010-09-20 NOTE — Procedures (Signed)
Summary: Capsule Endoscopy   Capsule Endoscopy  Procedure date:  03/06/2008  Findings:      Performing Location: [facility]    Ordering Physician:Cathlyn Tersigni Henrene Pastor , MD Report created/read [id number] Henrene Pastor, MD  Reason for Referral 60 YO FEMALE WITH IRON DEFICIENCY ANEMIA,RECURRENT, NEGATIVE EGD AND COLONOSCOPY7/07.  Procedure Information and Findings 1)COMPLETE STUDY 2)SOME SUBTLE VILLOUS BLUNTING AND FISSURING ,DUODENUM 3) FEW SCATTERED RED SPOTS,NONSPECIFIC  Summary and Recommendations FINDINGS NONSPECIFIC,QUESTIONABLE SPRUE,BUT RECENT TTG NORMAL,CONTINUE ORAL IRON,REPEAT IRON STUDIES 3-4 MONTHS AFTER INITIATION,IF NO SIGNIFICANT IMPROVEMENT,EGD WITH SMALL BOWEL BXS.  This report was created from the original report, which was reviewed and signed by the above listed reading physician.

## 2010-09-20 NOTE — Procedures (Signed)
Summary: Colonoscopy   Colonoscopy  Procedure date:  12/30/2002  Findings:      Location:  [place].  Results: Diverticulosis.         Procedures Next Due Date:    Colonoscopy: 12/2007 Patient Name: [name], [name] MRN:  Procedure Procedures: Colonoscopy CPT: [id number].  Personnel: Endoscopist: Docia Chuck. Henrene Pastor, MD.  Referred By: Darrick Penna. Linna Darner, MD.  Exam Location: Exam performed in Outpatient Clinic. Outpatient  Patient Consent: Procedure, Alternatives, Risks and Benefits discussed, consent obtained, from patient. Consent was obtained by the RN.  Indications  Evaluation of: Anemia  Symptoms: Constipation  Average Risk Screening Routine.  History  Pre-Exam Physical: Performed Dec 30, 2002. Entire physical exam was normal.  Exam Exam: Extent of exam reached: Cecum, extent intended: Cecum.  The cecum was identified by appendiceal orifice and IC valve. Patient position: on left side. Colon retroflexion performed. Images taken. ASA Classification: I. Tolerance: excellent.  Monitoring: Pulse and BP monitoring, Oximetry used. Supplemental O2 given.  Colon Prep Used Golytely for colon prep. Prep results: excellent.  Sedation Meds: Patient assessed and found to be appropriate for moderate (conscious) sedation. Fentanyl 125 mcg. given IV. Versed 12 given IV.  Findings NORMAL EXAM: Cecum to Rectum.  - DIVERTICULOSIS: Sigmoid Colon. ICD9: Diverticulosis, Colon: 562.10.   Assessment Abnormal examination, see findings above.  Diagnoses: 562.10: Diverticulosis, Colon.   Events  Unplanned Interventions: No intervention was required.  Unplanned Events: There were no complications. Plans Disposition: After procedure patient sent to recovery. After recovery patient sent home.  Scheduling/Referral: Colonoscopy, to Docia Chuck. Henrene Pastor, MD, in 5 years for repeat screening,   Comments: Return to the care of Dr. Linna Darner  This report was created from the  original endoscopy report, which was reviewed and signed by the above listed endoscopist.   cc:  Unice Cobble, MD      The Patient

## 2010-09-20 NOTE — Progress Notes (Signed)
Summary: returned call from [facility]  Phone Note Call from Patient   Caller: Patient Call For: clance Summary of Call: pt returned call from [facility] called yesterday). this is re: meds. [phone]. Initial call taken by: Cooper Render,  March 05, 2008 10:23 AM  Follow-up for Phone Call        Pt very indecisive as to which she would prefer, Ambien or Ambien CR. She is aware of the difference in them. She has been using her CPAP and has no trouble falling asleep, but does have trouble staying asleep. She says she will keep the Ambien CR on file at the pharmacy if she chooses to fill it. Are there any other recommendations for this patient? Please advise. Follow-up by: Francesca Jewett CMA,  March 05, 2008 10:37 AM  Additional Follow-up for Phone Call Additional follow up Details #1::        let pt know that if sleep maintenance is a problem, the CR will do better for her.  But it is ok to try regular first. Additional Follow-up by: Kathee Delton MD,  March 06, 2008 9:48 AM    Additional Follow-up for Phone Call Additional follow up Details #2::    Pt is aware and will fill Ambien if she needs to. Francesca Jewett [facility]  March 06, 2008 10:03 AM

## 2010-09-20 NOTE — Progress Notes (Signed)
Summary: rf request  Phone Note Refill Request Message from:  Pharmacy on July 31, 2008 10:55 AM  Refills Requested: Medication #1:  FERROUS SULFATE Take one capsule twice daily. [facility]   Method Requested: Fax to [facility] Initial call taken by: Dawson Bills,  July 31, 2008 10:55 AM      Prescriptions: FERROUS SULFATE Take one capsule twice daily.  #60 x 1   Entered by:   Verdie Mosher   Authorized by:   Alda Berthold. Paz MD   Signed by:   Verdie Mosher on 07/31/2008   Method used:   Faxed to ...       [facility] (retail)       [address]       [place]       [place], [place]  [address]       Ph: [phone]       Fax: [phone]   RxID:   [id number]

## 2010-09-20 NOTE — Progress Notes (Signed)
Summary: Critical Labs-Low Hemoglobin  Phone Note From Other Clinic   Caller: [facility] Reason for Call: Diagnosis Check Summary of Call: Low Hemoglobin 7.9, Debbie will fax over results.   Initial call taken by: Georgette Dover,  Jan 11, 2010 11:00 AM  Follow-up for Phone Call        see Appendum to labs Follow-up by: Unice Cobble MD,  Jan 11, 2010 2:27 PM     Appended Document: Critical Labs-Low Hemoglobin Labs Mailed

## 2010-09-20 NOTE — Letter (Signed)
Summary: Results Follow-up Letter  [facility] [place]   [place], [place] [address]   Phone: [phone]  Fax: [phone]    12/11/2007        [name] [address] [place], [place]  [address]  Dear Ms. Hufstedler,   The following are the results of your recent test(s):  Test     Result     Pap Smear    Normal_______  Not Normal_____       Comments: _________________________________________________________ Cholesterol LDL(Bad cholesterol):          Your goal is less than:         HDL (Good cholesterol):        Your goal is more than: _________________________________________________________ Other Tests:   _________________________________________________________  Please call for an appointment Or _______Please see attached.__________________________________________________ _________________________________________________________ _________________________________________________________  Sincerely,  Carley Hammed [place] [facility]

## 2010-09-20 NOTE — Progress Notes (Signed)
Summary: LAB RESULTS FROM 5/13???  Phone Note Call from Patient Call back at Home Phone [phone] Call back at [phone]   Caller: Patient Summary of Call: DR HOPPER PATIENT  PATIENT CALLED TO ASK ABOUT RESULTS FROM LAST LAB WORK (REPEAT CBC, ETC)--HAS NOT HEARD FROM [place] YET--  APPOINTMENTS SHOW VISIT FOR [facility] FOR REPEAT ON 5/13, (BUT PT WAS NOT ARRIVED), BUT THERE ARE RESULTS FOR LAB FOR THIS [place] Glaze [phone], THEN [phone] Initial call taken by: Berneta Sages,  Jan 07, 2008 10:34 AM  Follow-up for Phone Call        Patient wants to know the results of her labs done on 01/01/08 by Dr. Henrene Pastor,  patient understands that Dr. Henrene Pastor signed off on these labs and I will have to get Hopp to take a look at them before I can give her any information. Carley Hammed  Jan 07, 2008 11:25 AM  Follow-up by: Carley Hammed,  Jan 07, 2008 11:26 AM  Additional Follow-up for Phone Call Additional follow up Details #1::        CALLED PT --LAB RESULTS FOR MAY 13TH HAS BEEN GIVEN TO HER...SAYS SHE RECD RESULTS FROM DR PERRY'S OFFICE.Marland KitchenAllen Norris  Jan 08, 2008 4:37 PM Additional Follow-up by: Allen Norris,  Jan 08, 2008 4:37 PM         Appended Document: LAB RESULTS FROM 5/13??? see Dr Blanch Media response

## 2010-09-22 NOTE — Letter (Signed)
Summary: Out of Work  [facility] [place]   [place], [place] [address]   Phone: [phone]  Fax: [phone]    August 23, 2010   Employee:  [name]    To Whom It May Concern:   For Medical reasons, please excuse the above named employee from work for the following dates:  Start:   08/23/2010  End:   01/062012 Friday or next scheduled work day  If you need additional information, please feel free to contact our office.         Sincerely,    Chrae Emmaline Kluver CMA

## 2010-09-22 NOTE — Assessment & Plan Note (Signed)
Summary: ankle infected/cbs   Vital Signs:  Patient profile:   60 year old female Weight:      215.2 pounds BMI:     37.96 Temp:     98.9 degrees F oral Pulse rate:   72 / minute Resp:     15 per minute BP sitting:   130 / 78  (left arm) Cuff size:   large  Vitals Entered By: Georgette Dover CMA (August 09, 2010 3:04 PM) CC: Infected ankle-left, patient was seen at urgent care and prescribed rx on 08/04/10   Primary Care Provider:  Unice Cobble, MD   CC:  Infected ankle-left and patient was seen at urgent care and prescribed rx on 08/04/10.  History of Present Illness:      This is a 60 year old woman who presents with sequellae of  an injury to  left leg as a "gouge" from an  unknown object in her yard .She  had fallen  while pulling on wild vines in late October.Tetanus is up to date.  The patient also reports redness and increased warmth  @ the wound site.  Because of  pain, swelling and tenderness last week despite Bacitracin she was seen @ UC & C&S performed.Doxycycline & Bactroban were Rxedwith subjective improvement. She had cellulitis in 08/11 from a cat bite. No PMH of MRSA.  Current Medications (verified): 1)  Amlodipine Besylate 5 Mg  Tabs (Amlodipine Besylate) .Marland Kitchen.. 1 By Mouth Once Daily 2)  Lisinopril 20 Mg  Tabs (Lisinopril) .Marland Kitchen.. 1 By Mouth Once Daily 3)  Multivitamins   Tabs (Multiple Vitamin) .... Once Daily 4)  Sm Calcium 500 Mg  Tabs (Oyster Shell) .... Two Times A Day 5)  Nexium 40 Mg Cpdr (Esomeprazole Magnesium) .Marland Kitchen.. 1 Once Daily As Needed 6)  Meloxicam 7.5 Mg Tabs (Meloxicam) .Marland Kitchen.. 1 Two Times A Day As Needed Joint Pain 7)  Slow Fe 142 (45 Fe) Mg Cr-Tabs (Ferrous Sulfate) .Marland Kitchen.. 1 By Mouth Two Times A Day 8)  Vitamin D 1000 Unit Tabs (Cholecalciferol) .... One By Mouth Once Daily 9)  Doxycycline Hyclate 100 Mg Caps (Doxycycline Hyclate) .Marland Kitchen.. 1 By Mouth Two Times A Day X 10days  Allergies: 1)  ! Codeine  Review of Systems General:  Denies chills, fever, and  sweats.  Physical Exam  General:  well-nourished,in no acute distress; alert,appropriate and cooperative throughout examination Pulses:  R and L dorsalis pedis and posterior tibial pulses are full and equal bilaterally Skin:  7X4 mm eschar with 20X 30 mm surrounding mild erythema. No increased temp or tenderness   Impression & Recommendations:  Problem # 1:  CELLULITIS (ICD-682.9)  Her updated medication list for this problem includes:    Doxycycline Hyclate 100 Mg Caps (Doxycycline hyclate) .Marland Kitchen... 1 by mouth two times a day x 10days  Orders: Venipuncture [id number]) TLB-CBC Platelet - w/Differential (85025-CBCD) TLB-Sedimentation Rate (ESR) (85652-ESR)  Complete Medication List: 1)  Amlodipine Besylate 5 Mg Tabs (Amlodipine besylate) .Marland Kitchen.. 1 by mouth once daily 2)  Lisinopril 20 Mg Tabs (Lisinopril) .Marland Kitchen.. 1 by mouth once daily 3)  Multivitamins Tabs (Multiple vitamin) .... Once daily 4)  Sm Calcium 500 Mg Tabs (Oyster shell) .... Two times a day 5)  Nexium 40 Mg Cpdr (Esomeprazole magnesium) .Marland Kitchen.. 1 once daily as needed 6)  Meloxicam 7.5 Mg Tabs (Meloxicam) .Marland Kitchen.. 1 two times a day as needed joint pain 7)  Slow Fe 142 (45 Fe) Mg Cr-tabs (Ferrous sulfate) .Marland Kitchen.. 1 by mouth two times a day  8)  Vitamin D 1000 Unit Tabs (Cholecalciferol) .... One by mouth once daily 9)  Doxycycline Hyclate 100 Mg Caps (Doxycycline hyclate) .Marland Kitchen.. 1 by mouth two times a day x 10days  Patient Instructions: 1)  Verify the C&S results from [place] . Report Warning Signs as discussed.   Orders Added: 1)  Est. Patient Level III [id number] 2)  Venipuncture XI:[id number] 3)  TLB-CBC Platelet - w/Differential [85025-CBCD] 4)  TLB-Sedimentation Rate (ESR) [85652-ESR]  Appended Document: ankle infected/cbs

## 2010-09-22 NOTE — Assessment & Plan Note (Signed)
Summary: POSSIBLE URI VS FLU/WEAKNESS/RASH/KB   Vital Signs:  Patient profile:   60 year old female Weight:      213.4 pounds BMI:     37.64 Temp:     99.5 degrees F oral Pulse rate:   92 / minute Resp:     15 per minute BP sitting:   122 / 76  (left arm) Cuff size:   large  Vitals Entered By: Georgette Dover CMA (August 23, 2010 3:50 PM) CC: URI symptoms x 1 week and Rash since yesterday. Patient seen at urgent care on Sunday ( given zofran injection and GI cocktail)   Primary Care Provider:  Unice Cobble, MD   CC:  URI symptoms x 1 week and Rash since yesterday. Patient seen at urgent care on Sunday ( given zofran injection and GI cocktail).  History of Present Illness:      This is a 60 year old woman who presents with URI symptoms today .  The patient denies purulent nasal discharge, sore throat, productive cough, and earache.  Associated symptoms include low-grade fever (<100.5 degrees) and rash as of 08/22/2010.  The patient denies dyspnea and wheezing.  The patient also reports headache  &  unilateral facial pain on L .S/P 10 full days of Doxycycline beginning 12/15  & sulfa x 10 days, last dose this am for Enterobacter from LLE lesion .  The patient denies the following risk factors for Strep sinusitis: tooth pain and tender adenopathy.   She has had Flu shot.      The patient reports macules, redness, increased warmth, and tenderness on extremities only. She denies hives, pustules, blisters, and ulcers.  Rx: Benadryl for diffuse itching.The patient denies the following symptoms: nausea and vomiting treated with GI cocktail &  Zofran @ UC 08/22/2010.    Current Medications (verified): 1)  Amlodipine Besylate 5 Mg  Tabs (Amlodipine Besylate) .Marland Kitchen.. 1 By Mouth Once Daily 2)  Lisinopril 20 Mg  Tabs (Lisinopril) .Marland Kitchen.. 1 By Mouth Once Daily 3)  Multivitamins   Tabs (Multiple Vitamin) .... Once Daily 4)  Sm Calcium 500 Mg  Tabs (Oyster Shell) .... Two Times A Day 5)  Nexium 40 Mg Cpdr  (Esomeprazole Magnesium) .Marland Kitchen.. 1 Once Daily As Needed 6)  Meloxicam 7.5 Mg Tabs (Meloxicam) .Marland Kitchen.. 1 Two Times A Day As Needed Joint Pain 7)  Slow Fe 142 (45 Fe) Mg Cr-Tabs (Ferrous Sulfate) .Marland Kitchen.. 1 By Mouth Two Times A Day 8)  Vitamin D 1000 Unit Tabs (Cholecalciferol) .... One By Mouth Once Daily  Allergies: 1)  ! Codeine  Review of Systems GI:  Denies constipation and diarrhea; No clay colored stool. GU:  Denies discharge, dysuria, and hematuria; Urine intermittently dark. MS:  Complains of joint pain; denies joint redness and joint swelling.  Physical Exam  General:  appears fatigued ,in no acute distress; alert,appropriate and cooperative throughout examination Eyes:  No corneal or conjunctival inflammation noted. Perrla.No cojunctival hemorrhages Ears:  External ear exam shows no significant lesions or deformities.  Otoscopic examination reveals clear canals, tympanic membranes are intact bilaterally without bulging, retraction, inflammation or discharge. Hearing is grossly normal bilaterally. Nose:  External nasal examination shows no deformity or inflammation. Nasal mucosa are pink and moist without lesions or exudates. Septal dislocation Mouth:  Oral mucosa and oropharynx without lesions or exudates.  Teeth in good repair. Minimal  pharyngeal erythema.   No oral ulcers  Lungs:  Normal respiratory effort, chest expands symmetrically. Lungs are clear to auscultation,  no crackles or wheezes. Heart:  tachycardia (P 92) with flow murmur   Abdomen:  Bowel sounds positive,abdomen soft and non-tender without masses, organomegaly or hernias noted. Extremities:  No clubbing, cyanosis, edema. Mild knee crepitus ; mild DIP OA changes. No nail hemorrhages Neurologic:  alert & oriented X3.   Skin:  Scattered faint , tender erythematous lesions of variable size over extremities  Cervical Nodes:  No lymphadenopathy noted Axillary Nodes:  No palpable lymphadenopathy Psych:  memory intact for recent  and remote, normally interactive, and good eye contact.     Impression & Recommendations:  Problem # 1:  FEVER (ICD-780.60)  Orders: Venipuncture [id number]) TLB-CBC Platelet - w/Differential (85025-CBCD) TLB-Hepatic/Liver Function Pnl (80076-HEPATIC) TLB-BMP (Basic Metabolic Panel-BMET) (99991111) T-Culture, Urine WD:[id number]) Specimen Handling (99000) UA Dipstick W/ Micro (manual) (81000)  Problem # 2:  RASH-NONVESICULAR (ICD-782.1)  R/O antibiotic related  Orders: Venipuncture [id number]) TLB-CBC Platelet - w/Differential (85025-CBCD) TLB-Hepatic/Liver Function Pnl (80076-HEPATIC) Specimen Handling (99000) UA Dipstick W/ Micro (manual) (81000)  Problem # 3:  PAIN IN JOINT, SITE UNSPECIFIED (ICD-719.40) probably due to fever  Complete Medication List: 1)  Amlodipine Besylate 5 Mg Tabs (Amlodipine besylate) .Marland Kitchen.. 1 by mouth once daily 2)  Lisinopril 20 Mg Tabs (Lisinopril) .Marland Kitchen.. 1 by mouth once daily 3)  Multivitamins Tabs (Multiple vitamin) .... Once daily 4)  Sm Calcium 500 Mg Tabs (Oyster shell) .... Two times a day 5)  Nexium 40 Mg Cpdr (Esomeprazole magnesium) .Marland Kitchen.. 1 once daily as needed 6)  Meloxicam 7.5 Mg Tabs (Meloxicam) .Marland Kitchen.. 1 two times a day as needed joint pain 7)  Slow Fe 142 (45 Fe) Mg Cr-tabs (Ferrous sulfate) .Marland Kitchen.. 1 by mouth two times a day 8)  Vitamin D 1000 Unit Tabs (Cholecalciferol) .... One by mouth once daily  Other Orders: Flu A+B AL:[id number])  Patient Instructions: 1)  Take 650-1000mg  of Tylenol every 4-6 hours as needed for relief of pain or comfort of fever AVOID taking more than 4000mg   in a 24 hour period (can cause liver damage in higher doses) OR take 400-600mg  of Ibuprofen (Advil, Motrin) with food every 4-6 hours as needed for relief of pain or comfort of fever. 2)  Recommended remaining out of work for 01/03 & 08/24/2010. additional imaging ( CT  scans of sinuses, ECHOcardiogram) may be necessary.Biopsy of rash  may be indicated if it fails to  resolve off Sulfa.Drink as much fluid as you can tolerate for the next few days. To ER for high fever  or progression of rash.   Orders Added: 1)  Flu A+B [87400] 2)  Venipuncture [id number] 3)  TLB-CBC Platelet - w/Differential [85025-CBCD] 4)  TLB-Hepatic/Liver Function Pnl [80076-HEPATIC] 5)  TLB-BMP (Basic Metabolic Panel-BMET) 123456 6)  Est. Patient Level IV GF:[id number] 7)  T-Culture, Urine IG:[id number] 8)  Specimen Handling [99000] 9)  UA Dipstick W/ Micro (manual) [81000]    Laboratory Results   Urine Tests   Date/Time Reported: August 23, 2010 4:28 PM   Routine Urinalysis   Color: straw Appearance: Clear Glucose: negative   (Normal Range: Negative) Bilirubin: negative   (Normal Range: Negative) Ketone: negative   (Normal Range: Negative) Spec. Gravity: 1.015   (Normal Range: 1.003-1.035) Blood: negative   (Normal Range: Negative) pH: 5.0   (Normal Range: 5.0-8.0) Protein: 30   (Normal Range: Negative) Urobilinogen: 0.2   (Normal Range: 0-1) Nitrite: negative   (Normal Range: Negative) Leukocyte Esterace: negative   (Normal Range: Negative)    Comments: Heath Lark  August 23, 2010 4:28 PM cx sent per order   Other Tests  Influenza A: negative Influenza B: negative   Laboratory Results   Urine Tests    Routine Urinalysis   Color: straw Appearance: Clear Glucose: negative   (Normal Range: Negative) Bilirubin: negative   (Normal Range: Negative) Ketone: negative   (Normal Range: Negative) Spec. Gravity: 1.015   (Normal Range: 1.003-1.035) Blood: negative   (Normal Range: Negative) pH: 5.0   (Normal Range: 5.0-8.0) Protein: 30   (Normal Range: Negative) Urobilinogen: 0.2   (Normal Range: 0-1) Nitrite: negative   (Normal Range: Negative) Leukocyte Esterace: negative   (Normal Range: Negative)    Comments: Heath Lark  August 23, 2010 4:28 PM cx sent per order   Other Tests  Influenza: negative

## 2010-09-22 NOTE — Progress Notes (Signed)
Summary: Lab Results  Phone Note Outgoing Call Call back at [facility] Phone [phone]   Call placed by: Georgette Dover CMA,  August 11, 2010 4:49 PM Call placed to: Patient Details for Reason: Lab Results Summary of Call: Left message on voicemail with results:  Mild anemia persists . White count & sed rate are not elevated. Please complete the entire course of Doxycycline as the wound is improving. Call after that if wound not completely healed. Hopp  Patient to call if questions or concerns (copy of report mailed) Georgette Dover CMA  August 11, 2010 4:50 PM

## 2010-09-22 NOTE — Progress Notes (Signed)
Summary: CT Results  Phone Note Outgoing Call Call back at [facility] Phone [phone]   Call placed by: Georgette Dover CMA,  August 26, 2010 4:06 PM Call placed to: Patient Details for Reason: CT Results Summary of Call: Left message on patient's home phone(ok per designated signed released):  Good ; no sinusitis. Biopsy indicated if rash is not resolving (Per Dr.Hopper), copy of report to be Woody Seller CMA  August 26, 2010 4:06 PM

## 2010-09-22 NOTE — Progress Notes (Signed)
Summary: Still sick  Phone Note Call from Patient Call back at Home Phone [phone]   Summary of Call: Patient left message on triage noting that she is still sick and has fever and severe HA. She notes that her fever 100.7. Please advise.Ernestene Mention CMA,  August 24, 2010 3:24 PM  Patient was advised per MD instructions from yesterday to go to the ER. Patient refused ER and would like a call from MD. Ernestene Mention CMA  August 24, 2010 3:30 PM   Follow-up for Phone Call        see order & Rx Follow-up by: Unice Cobble MD,  August 24, 2010 3:50 PM  Additional Follow-up for Phone Call Additional follow up Details #1::        Patient notified. Ernestene Mention CMA  August 24, 2010 3:59 PM   New Problems: HEADACHE (ICD-784.0)   New Problems: HEADACHE (ICD-784.0) New/Updated Medications: METRONIDAZOLE 500 MG TABS (METRONIDAZOLE) 1 three times a day ; avoid alcoho Prescriptions: METRONIDAZOLE 500 MG TABS (METRONIDAZOLE) 1 three times a day ; avoid alcoho  #21 x 0   Entered and Authorized by:   Unice Cobble MD   Signed by:   Unice Cobble MD on 08/24/2010   Method used:   Printed then faxed to ...       [facility]       [address]       [place], [place]  [address]       Ph: [phone]       Fax: [phone]   RxID:   [id number]

## 2010-09-22 NOTE — Progress Notes (Signed)
Summary: call-a-nurse  Phone Note Outgoing Call   Details for Reason: [facility]-Nurse Triage Call Report Triage Record Num: [id number] Operator: Hughes Better Patient Name: [name] Call Date & Time: 08/22/2010 10:47:54AM Patient Phone: [phone] PCP: Unice Cobble Patient Gender: Female PCP Fax : [phone] Patient DOB: Oct 10, 1950 Practice Name: [facility] Reason for Call: Patient is calling to report that she has been sick since 08/18/2010. Patient went to [facility] [address] on 08/21/2010: received Zofran and GI cocktail. Patient has a prescription for Zofran but has not filled it yet. Patient is tolerating soup and saltines today. Afebrile. Patient is not nauseated. Patient is taking Tylenol and Advil for headache. Last vomited on 08/21/2010. Patient reports no diarrhea: last bowel movement was 4 days ago. RN reviewed nausea or vomiting care advice with patient. Patient advised to call back anytime. Protocol(s) Used: Nausea or Vomiting Recommended Outcome per Protocol: See Provider within 72 Hours Reason for Outcome: Loss of appetite for 3 or more days OR nausea for 7 or more days Care Advice:  ~ List, or take, all current prescription(s), nonprescription or alternative medication(s) to provider for evaluation. Nausea Care Advice: - Drink small amounts of clear, sweetened liquids or ice cold drinks. - Eat light, bland foods such as saltine crackers or plain bread. - Do not eat high fat, highly seasoned, high fiber, or high sugar content foods. - Avoid mixing hot food and cold foods. - Eat smaller, more frequent meals. - Rest as much as possible in a sitting or in a propped lying position. Do not lie flat for at least 2 hours after eating. - Do not take pain medication (such as aspirin, NSAIDs) while nauseated. - Rest as much as possible until symptoms improve since activity may worsen nausea Summary of Call: I spoke with the patient this AM, and  she is coming in today. Ernestene Mention CMA  August 23, 2010 9:58 AM

## 2010-10-06 NOTE — Consult Note (Signed)
Summary: Dermatology-Dr. Allyn Kenner  Dermatology-Dr. Allyn Kenner   Imported By: Laural Benes 09/26/2010 15:59:04  _____________________________________________________________________  External Attachment:    Type:   Image     Comment:   External Document

## 2010-10-31 LAB — WOUND CULTURE: Gram Stain: NONE SEEN

## 2010-11-04 LAB — CULTURE, ROUTINE-ABSCESS: Gram Stain: NONE SEEN

## 2010-11-09 ENCOUNTER — Other Ambulatory Visit (HOSPITAL_COMMUNITY): Payer: Self-pay | Admitting: Physical Medicine and Rehabilitation

## 2010-11-09 DIAGNOSIS — M545 Low back pain, unspecified: Secondary | ICD-10-CM

## 2010-11-12 ENCOUNTER — Ambulatory Visit (HOSPITAL_COMMUNITY)
Admission: RE | Admit: 2010-11-12 | Discharge: 2010-11-12 | Disposition: A | Discharge status: Home / Self Care | Payer: Commercial Managed Care - PPO | Source: Ambulatory Visit | Attending: Physical Medicine and Rehabilitation | Admitting: Physical Medicine and Rehabilitation

## 2010-11-12 DIAGNOSIS — M545 Low back pain, unspecified: Secondary | ICD-10-CM

## 2010-11-12 DIAGNOSIS — R109 Unspecified abdominal pain: Secondary | ICD-10-CM | POA: Insufficient documentation

## 2010-11-12 DIAGNOSIS — R1909 Other intra-abdominal and pelvic swelling, mass and lump: Secondary | ICD-10-CM | POA: Insufficient documentation

## 2010-11-12 DIAGNOSIS — M4310 Spondylolisthesis, site unspecified: Secondary | ICD-10-CM | POA: Insufficient documentation

## 2010-11-15 ENCOUNTER — Other Ambulatory Visit: Payer: Self-pay | Admitting: Orthopedic Surgery

## 2010-11-15 DIAGNOSIS — M545 Low back pain, unspecified: Secondary | ICD-10-CM

## 2010-11-15 DIAGNOSIS — M4800 Spinal stenosis, site unspecified: Secondary | ICD-10-CM

## 2010-11-16 ENCOUNTER — Ambulatory Visit
Admission: RE | Admit: 2010-11-16 | Discharge: 2010-11-16 | Disposition: A | Discharge status: Home / Self Care | Payer: Commercial Managed Care - PPO | Source: Ambulatory Visit | Attending: Orthopedic Surgery | Admitting: Orthopedic Surgery

## 2010-11-16 DIAGNOSIS — M4800 Spinal stenosis, site unspecified: Secondary | ICD-10-CM

## 2010-11-16 DIAGNOSIS — M545 Low back pain, unspecified: Secondary | ICD-10-CM

## 2010-11-20 HISTORY — PX: BRONCHOSCOPY: SUR163

## 2010-11-25 LAB — POCT RAPID STREP A (OFFICE): Streptococcus, Group A Screen (Direct): NEGATIVE

## 2010-11-29 ENCOUNTER — Other Ambulatory Visit (HOSPITAL_COMMUNITY): Payer: Self-pay | Admitting: Neurosurgery

## 2010-11-29 ENCOUNTER — Ambulatory Visit (HOSPITAL_COMMUNITY)
Admission: RE | Admit: 2010-11-29 | Discharge: 2010-11-29 | Disposition: A | Discharge status: Home / Self Care | Payer: 59 | Source: Ambulatory Visit | Attending: Neurosurgery | Admitting: Neurosurgery

## 2010-11-29 DIAGNOSIS — M479 Spondylosis, unspecified: Secondary | ICD-10-CM

## 2010-11-29 DIAGNOSIS — M5410 Radiculopathy, site unspecified: Secondary | ICD-10-CM

## 2010-11-29 DIAGNOSIS — IMO0002 Reserved for concepts with insufficient information to code with codable children: Secondary | ICD-10-CM

## 2010-11-29 DIAGNOSIS — Z01812 Encounter for preprocedural laboratory examination: Secondary | ICD-10-CM | POA: Insufficient documentation

## 2010-11-29 LAB — CREATININE, SERUM
Creatinine, Ser: 0.68 mg/dL (ref 0.4–1.2)
GFR calc Af Amer: >60 mL/min (ref >60)
GFR calc non Af Amer: >60 mL/min (ref >60)

## 2010-11-30 ENCOUNTER — Other Ambulatory Visit (HOSPITAL_COMMUNITY): Payer: Self-pay | Admitting: Neurosurgery

## 2010-11-30 ENCOUNTER — Ambulatory Visit (HOSPITAL_COMMUNITY)
Admission: RE | Admit: 2010-11-30 | Discharge: 2010-11-30 | Disposition: A | Discharge status: Home / Self Care | Payer: Commercial Managed Care - PPO | Source: Ambulatory Visit | Attending: Neurosurgery | Admitting: Neurosurgery

## 2010-11-30 DIAGNOSIS — M545 Low back pain, unspecified: Secondary | ICD-10-CM

## 2010-11-30 DIAGNOSIS — C801 Malignant (primary) neoplasm, unspecified: Secondary | ICD-10-CM | POA: Insufficient documentation

## 2010-11-30 DIAGNOSIS — C7951 Secondary malignant neoplasm of bone: Secondary | ICD-10-CM | POA: Insufficient documentation

## 2010-11-30 DIAGNOSIS — D1809 Hemangioma of other sites: Secondary | ICD-10-CM | POA: Insufficient documentation

## 2010-11-30 DIAGNOSIS — M5137 Other intervertebral disc degeneration, lumbosacral region: Secondary | ICD-10-CM | POA: Insufficient documentation

## 2010-11-30 DIAGNOSIS — IMO0002 Reserved for concepts with insufficient information to code with codable children: Secondary | ICD-10-CM

## 2010-11-30 DIAGNOSIS — M549 Dorsalgia, unspecified: Secondary | ICD-10-CM | POA: Insufficient documentation

## 2010-11-30 DIAGNOSIS — R52 Pain, unspecified: Secondary | ICD-10-CM

## 2010-11-30 DIAGNOSIS — M5126 Other intervertebral disc displacement, lumbar region: Secondary | ICD-10-CM | POA: Insufficient documentation

## 2010-11-30 DIAGNOSIS — M51379 Other intervertebral disc degeneration, lumbosacral region without mention of lumbar back pain or lower extremity pain: Secondary | ICD-10-CM | POA: Insufficient documentation

## 2010-11-30 DIAGNOSIS — J90 Pleural effusion, not elsewhere classified: Secondary | ICD-10-CM | POA: Insufficient documentation

## 2010-11-30 DIAGNOSIS — J984 Other disorders of lung: Secondary | ICD-10-CM | POA: Insufficient documentation

## 2010-11-30 DIAGNOSIS — M479 Spondylosis, unspecified: Secondary | ICD-10-CM

## 2010-11-30 DIAGNOSIS — C7952 Secondary malignant neoplasm of bone marrow: Secondary | ICD-10-CM | POA: Insufficient documentation

## 2010-11-30 MED ORDER — GADOBENATE DIMEGLUMINE 529 MG/ML IV SOLN
18.0000 mL | Freq: Once | INTRAVENOUS | Status: AC
  Administered 2010-11-30: 18 mL via INTRAVENOUS

## 2010-12-01 ENCOUNTER — Ambulatory Visit (HOSPITAL_COMMUNITY)
Admission: RE | Admit: 2010-12-01 | Discharge: 2010-12-01 | Disposition: A | Discharge status: Home / Self Care | Payer: 59 | Source: Ambulatory Visit | Attending: Neurosurgery | Admitting: Neurosurgery

## 2010-12-01 ENCOUNTER — Encounter (HOSPITAL_COMMUNITY): Payer: Self-pay

## 2010-12-01 DIAGNOSIS — M545 Low back pain, unspecified: Secondary | ICD-10-CM

## 2010-12-01 DIAGNOSIS — M51379 Other intervertebral disc degeneration, lumbosacral region without mention of lumbar back pain or lower extremity pain: Secondary | ICD-10-CM | POA: Insufficient documentation

## 2010-12-01 DIAGNOSIS — M479 Spondylosis, unspecified: Secondary | ICD-10-CM

## 2010-12-01 DIAGNOSIS — J90 Pleural effusion, not elsewhere classified: Secondary | ICD-10-CM | POA: Insufficient documentation

## 2010-12-01 DIAGNOSIS — M5137 Other intervertebral disc degeneration, lumbosacral region: Secondary | ICD-10-CM | POA: Insufficient documentation

## 2010-12-01 DIAGNOSIS — C801 Malignant (primary) neoplasm, unspecified: Secondary | ICD-10-CM | POA: Insufficient documentation

## 2010-12-01 DIAGNOSIS — R599 Enlarged lymph nodes, unspecified: Secondary | ICD-10-CM | POA: Insufficient documentation

## 2010-12-01 DIAGNOSIS — K449 Diaphragmatic hernia without obstruction or gangrene: Secondary | ICD-10-CM | POA: Insufficient documentation

## 2010-12-01 DIAGNOSIS — IMO0002 Reserved for concepts with insufficient information to code with codable children: Secondary | ICD-10-CM

## 2010-12-01 DIAGNOSIS — M47817 Spondylosis without myelopathy or radiculopathy, lumbosacral region: Secondary | ICD-10-CM | POA: Insufficient documentation

## 2010-12-01 DIAGNOSIS — J984 Other disorders of lung: Secondary | ICD-10-CM | POA: Insufficient documentation

## 2010-12-01 HISTORY — DX: Essential (primary) hypertension: I10

## 2010-12-01 MED ORDER — IOHEXOL 300 MG/ML  SOLN: 100.0000 mL | Freq: Once | INTRAMUSCULAR | Status: DC | PRN

## 2010-12-01 MED ORDER — IOHEXOL 300 MG/ML  SOLN
100.0000 mL | Freq: Once | INTRAMUSCULAR | Status: AC | PRN
  Administered 2010-12-01: 100 mL via INTRAVENOUS

## 2010-12-02 ENCOUNTER — Encounter (HOSPITAL_BASED_OUTPATIENT_CLINIC_OR_DEPARTMENT_OTHER): Payer: Commercial Managed Care - PPO | Admitting: Oncology

## 2010-12-02 ENCOUNTER — Other Ambulatory Visit: Payer: Self-pay | Admitting: Oncology

## 2010-12-02 DIAGNOSIS — C7949 Secondary malignant neoplasm of other parts of nervous system: Secondary | ICD-10-CM

## 2010-12-02 DIAGNOSIS — C801 Malignant (primary) neoplasm, unspecified: Secondary | ICD-10-CM

## 2010-12-02 DIAGNOSIS — C8589 Other specified types of non-Hodgkin lymphoma, extranodal and solid organ sites: Secondary | ICD-10-CM

## 2010-12-02 LAB — COMPREHENSIVE METABOLIC PANEL
ALT: 20 U/L (ref 0–35)
AST: 20 U/L (ref 0–37)
Albumin: 3.5 g/dL (ref 3.5–5.2)
Alkaline Phosphatase: 131 U/L — ABNORMAL HIGH (ref 39–117)
BUN: 9 mg/dL (ref 6–23)
CO2: 23 mEq/L (ref 19–32)
Calcium: 9.2 mg/dL (ref 8.4–10.5)
Chloride: 99 mEq/L (ref 96–112)
Creatinine, Ser: 0.66 mg/dL (ref 0.40–1.20)
Glucose, Bld: 98 mg/dL (ref 70–99)
Potassium: 4.1 mEq/L (ref 3.5–5.3)
Sodium: 136 mEq/L (ref 135–145)
Total Bilirubin: 0.4 mg/dL (ref 0.3–1.2)
Total Protein: 7.2 g/dL (ref 6.0–8.3)

## 2010-12-02 LAB — CBC WITH DIFFERENTIAL/PLATELET
BASO%: 0.1 % (ref 0.0–2.0)
Basophils Absolute: 0 10*3/uL (ref 0.0–0.1)
EOS%: 0.5 % (ref 0.0–7.0)
Eosinophils Absolute: 0 10*3/uL (ref 0.0–0.5)
HCT: 29.9 % — ABNORMAL LOW (ref 34.8–46.6)
HGB: 9.8 g/dL — ABNORMAL LOW (ref 11.6–15.9)
LYMPH%: 7.8 % — ABNORMAL LOW (ref 14.0–49.7)
MCH: 28.4 pg (ref 25.1–34.0)
MCHC: 32.8 g/dL (ref 31.5–36.0)
MCV: 86.6 fL (ref 79.5–101.0)
MONO#: 0.6 10*3/uL (ref 0.1–0.9)
MONO%: 7.3 % (ref 0.0–14.0)
NEUT#: 6.5 10*3/uL (ref 1.5–6.5)
NEUT%: 84.3 % — ABNORMAL HIGH (ref 38.4–76.8)
Platelets: 434 10*3/uL — ABNORMAL HIGH (ref 145–400)
RBC: 3.45 10*6/uL — ABNORMAL LOW (ref 3.70–5.45)
RDW: 16.2 % — ABNORMAL HIGH (ref 11.2–14.5)
WBC: 7.7 10*3/uL (ref 3.9–10.3)
lymph#: 0.6 10*3/uL — ABNORMAL LOW (ref 0.9–3.3)

## 2010-12-02 LAB — PROTIME-INR
INR: 1.2 — ABNORMAL LOW (ref 2.00–3.50)
Protime: 14.4 Seconds — ABNORMAL HIGH (ref 10.6–13.4)

## 2010-12-02 LAB — LACTATE DEHYDROGENASE: LDH: 152 U/L (ref 94–250)

## 2010-12-02 LAB — HOLD TUBE, BLOOD BANK

## 2010-12-05 ENCOUNTER — Encounter: Payer: Self-pay | Admitting: Pulmonary Disease

## 2010-12-06 ENCOUNTER — Encounter: Payer: Self-pay | Admitting: Pulmonary Disease

## 2010-12-06 ENCOUNTER — Ambulatory Visit (INDEPENDENT_AMBULATORY_CARE_PROVIDER_SITE_OTHER): Payer: Commercial Managed Care - PPO | Admitting: Pulmonary Disease

## 2010-12-06 VITALS — BP 126/84 | HR 70 | Temp 98.8°F | Ht 62.5 in | Wt 204.6 lb

## 2010-12-06 DIAGNOSIS — R918 Other nonspecific abnormal finding of lung field: Secondary | ICD-10-CM | POA: Insufficient documentation

## 2010-12-06 DIAGNOSIS — R222 Localized swelling, mass and lump, trunk: Secondary | ICD-10-CM

## 2010-12-06 NOTE — Progress Notes (Signed)
  Subjective:    Patient ID: [name], female    DOB: September 10, 1950, 60 y.o.   MRN: [id number]  HPI The pt is a 60y/o female who I have been asked to see for an abnormal ct chest.  She recently had worsening LBP, and initially was treated with po steroids and injections.  She ultimately was found to have a spinal mass, with further w/u showing the abnormal findings on ct chest.  The pt denies any cough, hemoptysis, chest pain.  She has mild doe that is chronic.  She has a good appetite, but has lost some weight.  She tells me her health maintenance is up to date, and she is a never smoker.    Review of Systems  Constitutional: Negative for fever and unexpected weight change.  HENT: Negative for ear pain, nosebleeds, congestion, sore throat, rhinorrhea, sneezing, trouble swallowing, dental problem, postnasal drip and sinus pressure.   Eyes: Negative for redness and itching.  Respiratory: Positive for shortness of breath. Negative for cough, chest tightness and wheezing.   Cardiovascular: Negative for palpitations and leg swelling.  Gastrointestinal: Negative for nausea and vomiting.  Genitourinary: Negative for dysuria.  Musculoskeletal: Negative for joint swelling.  Skin: Negative for rash.  Neurological: Negative for headaches.  Hematological: Does not bruise/bleed easily.  Psychiatric/Behavioral: Negative for dysphoric mood. The patient is not nervous/anxious.        Objective:   Physical Exam Constitutional:  Obese female, no acute distress  HENT:  Nares patent without discharge  Oropharynx without exudate, palate and uvula are normal  Eyes:  Perrla, eomi, no scleral icterus  Neck:  No JVD, no TMG  Cardiovascular:  Normal rate, regular rhythm, no rubs or gallops.  No murmurs        Intact distal pulses  Pulmonary :  Normal breath sounds except minimal basilar crackles, no stridor or respiratory distress   No  rhonchi, or wheezing  Abdominal:  Soft, nondistended, bowel  sounds present.  No tenderness noted.   Musculoskeletal:  mild lower extremity edema noted.  Lymph Nodes:  No cervical lymphadenopathy noted  Skin:  No cyanosis noted  Neurologic:  Alert, appropriate, moves all 4 extremities without obvious deficit.         Assessment & Plan:

## 2010-12-06 NOTE — Assessment & Plan Note (Signed)
The pt has a spinal mass, and now found to have 2 masses on cxr/ct chest.  This is very concerning for some type of malignant process, and will need tissue diagnosis.  I think she should have a PET scan while we are waiting to do biopsy, and may locate an area that may be easier to biopsy.  I spoke with pt about TTNA vs ENB, and feel the latter will have a lower complication rate for bleeding and ptx.  The pt is agreeable.

## 2010-12-06 NOTE — Patient Instructions (Signed)
Will set up for PET scan this week Will try and get in for special bronchoscopy as soon as possible.  Will call you with the date.

## 2010-12-09 ENCOUNTER — Encounter (HOSPITAL_COMMUNITY)
Admission: RE | Admit: 2010-12-09 | Discharge: 2010-12-09 | Disposition: A | Discharge status: Home / Self Care | Payer: 59 | Source: Ambulatory Visit | Attending: Pulmonary Disease | Admitting: Pulmonary Disease

## 2010-12-09 DIAGNOSIS — R918 Other nonspecific abnormal finding of lung field: Secondary | ICD-10-CM

## 2010-12-09 DIAGNOSIS — M899 Disorder of bone, unspecified: Secondary | ICD-10-CM | POA: Insufficient documentation

## 2010-12-09 DIAGNOSIS — J984 Other disorders of lung: Secondary | ICD-10-CM | POA: Insufficient documentation

## 2010-12-09 DIAGNOSIS — M949 Disorder of cartilage, unspecified: Secondary | ICD-10-CM | POA: Insufficient documentation

## 2010-12-09 LAB — GLUCOSE, CAPILLARY: Glucose-Capillary: 119 mg/dL — ABNORMAL HIGH (ref 70–99)

## 2010-12-09 MED ORDER — FLUDEOXYGLUCOSE F - 18 (FDG) INJECTION
15.0000 | Freq: Once | INTRAVENOUS | Status: AC | PRN
  Administered 2010-12-09: 15 via INTRAVENOUS

## 2010-12-13 ENCOUNTER — Ambulatory Visit (HOSPITAL_COMMUNITY)
Admission: RE | Admit: 2010-12-13 | Discharge: 2010-12-13 | Disposition: A | Discharge status: Home / Self Care | Payer: 59 | Source: Ambulatory Visit | Attending: Pulmonary Disease | Admitting: Pulmonary Disease

## 2010-12-13 ENCOUNTER — Other Ambulatory Visit: Payer: Self-pay | Admitting: Pulmonary Disease

## 2010-12-13 ENCOUNTER — Encounter (HOSPITAL_COMMUNITY)
Admission: RE | Admit: 2010-12-13 | Discharge: 2010-12-13 | Disposition: A | Discharge status: Home / Self Care | Payer: 59 | Source: Ambulatory Visit | Attending: Pulmonary Disease | Admitting: Pulmonary Disease

## 2010-12-13 DIAGNOSIS — Z01818 Encounter for other preprocedural examination: Secondary | ICD-10-CM | POA: Insufficient documentation

## 2010-12-13 DIAGNOSIS — Z01810 Encounter for preprocedural cardiovascular examination: Secondary | ICD-10-CM | POA: Insufficient documentation

## 2010-12-13 DIAGNOSIS — Z01811 Encounter for preprocedural respiratory examination: Secondary | ICD-10-CM | POA: Insufficient documentation

## 2010-12-13 DIAGNOSIS — Z01812 Encounter for preprocedural laboratory examination: Secondary | ICD-10-CM | POA: Insufficient documentation

## 2010-12-13 DIAGNOSIS — J984 Other disorders of lung: Secondary | ICD-10-CM | POA: Insufficient documentation

## 2010-12-13 DIAGNOSIS — R918 Other nonspecific abnormal finding of lung field: Secondary | ICD-10-CM

## 2010-12-13 LAB — BASIC METABOLIC PANEL
BUN: 15 mg/dL (ref 6–23)
CO2: 27 mEq/L (ref 19–32)
Calcium: 9.2 mg/dL (ref 8.4–10.5)
Chloride: 102 mEq/L (ref 96–112)
Creatinine, Ser: 0.67 mg/dL (ref 0.4–1.2)
GFR calc Af Amer: >60 mL/min (ref >60)
GFR calc non Af Amer: >60 mL/min (ref >60)
Glucose, Bld: 107 mg/dL — ABNORMAL HIGH (ref 70–99)
Potassium: 4.2 mEq/L (ref 3.5–5.1)
Sodium: 137 mEq/L (ref 135–145)

## 2010-12-13 LAB — CBC
HCT: 37.9 % (ref 36.0–46.0)
Hemoglobin: 11.9 g/dL — ABNORMAL LOW (ref 12.0–15.0)
MCH: 27.4 pg (ref 26.0–34.0)
MCHC: 31.4 g/dL (ref 30.0–36.0)
MCV: 87.1 fL (ref 78.0–100.0)
Platelets: 392 10*3/uL (ref 150–400)
RBC: 4.35 MIL/uL (ref 3.87–5.11)
RDW: 15.3 % (ref 11.5–15.5)
WBC: 13.5 10*3/uL — ABNORMAL HIGH (ref 4.0–10.5)

## 2010-12-13 LAB — APTT: aPTT: 21 seconds — ABNORMAL LOW (ref 24–37)

## 2010-12-13 LAB — SURGICAL PCR SCREEN
MRSA, PCR: NEGATIVE
Staphylococcus aureus: POSITIVE — AB

## 2010-12-13 LAB — PROTIME-INR
INR: 1.05 (ref 0.00–1.49)
Prothrombin Time: 13.9 seconds (ref 11.6–15.2)

## 2010-12-16 ENCOUNTER — Encounter (HOSPITAL_COMMUNITY): Payer: Self-pay

## 2010-12-16 ENCOUNTER — Ambulatory Visit (HOSPITAL_COMMUNITY)
Admission: RE | Admit: 2010-12-16 | Discharge: 2010-12-16 | Disposition: A | Discharge status: Home / Self Care | Payer: 59 | Source: Ambulatory Visit | Attending: Pulmonary Disease | Admitting: Pulmonary Disease

## 2010-12-16 ENCOUNTER — Other Ambulatory Visit (HOSPITAL_COMMUNITY): Payer: Self-pay | Admitting: Obstetrics and Gynecology

## 2010-12-16 ENCOUNTER — Other Ambulatory Visit: Payer: Self-pay | Admitting: Pulmonary Disease

## 2010-12-16 DIAGNOSIS — M4000 Postural kyphosis, site unspecified: Secondary | ICD-10-CM | POA: Insufficient documentation

## 2010-12-16 DIAGNOSIS — E0789 Other specified disorders of thyroid: Secondary | ICD-10-CM | POA: Insufficient documentation

## 2010-12-16 DIAGNOSIS — R918 Other nonspecific abnormal finding of lung field: Secondary | ICD-10-CM

## 2010-12-16 DIAGNOSIS — C3490 Malignant neoplasm of unspecified part of unspecified bronchus or lung: Secondary | ICD-10-CM | POA: Insufficient documentation

## 2010-12-16 DIAGNOSIS — K449 Diaphragmatic hernia without obstruction or gangrene: Secondary | ICD-10-CM | POA: Insufficient documentation

## 2010-12-16 DIAGNOSIS — M47814 Spondylosis without myelopathy or radiculopathy, thoracic region: Secondary | ICD-10-CM | POA: Insufficient documentation

## 2010-12-20 ENCOUNTER — Other Ambulatory Visit: Payer: Self-pay | Admitting: Pulmonary Disease

## 2010-12-20 ENCOUNTER — Ambulatory Visit (HOSPITAL_COMMUNITY)
Admission: RE | Admit: 2010-12-20 | Discharge: 2010-12-20 | Disposition: A | Discharge status: Home / Self Care | Payer: 59 | Source: Ambulatory Visit | Attending: Pulmonary Disease | Admitting: Pulmonary Disease

## 2010-12-20 ENCOUNTER — Ambulatory Visit (HOSPITAL_COMMUNITY): Payer: 59

## 2010-12-20 DIAGNOSIS — R222 Localized swelling, mass and lump, trunk: Secondary | ICD-10-CM

## 2010-12-20 DIAGNOSIS — G4733 Obstructive sleep apnea (adult) (pediatric): Secondary | ICD-10-CM | POA: Insufficient documentation

## 2010-12-20 DIAGNOSIS — Z01810 Encounter for preprocedural cardiovascular examination: Secondary | ICD-10-CM | POA: Insufficient documentation

## 2010-12-20 DIAGNOSIS — Z01818 Encounter for other preprocedural examination: Secondary | ICD-10-CM | POA: Insufficient documentation

## 2010-12-20 DIAGNOSIS — I10 Essential (primary) hypertension: Secondary | ICD-10-CM | POA: Insufficient documentation

## 2010-12-20 HISTORY — PX: OTHER SURGICAL HISTORY: SHX169

## 2010-12-22 ENCOUNTER — Other Ambulatory Visit: Payer: Self-pay | Admitting: Oncology

## 2010-12-22 DIAGNOSIS — R222 Localized swelling, mass and lump, trunk: Secondary | ICD-10-CM

## 2010-12-22 LAB — CULTURE, RESPIRATORY W GRAM STAIN: Gram Stain: NONE SEEN

## 2010-12-23 ENCOUNTER — Telehealth: Payer: Self-pay | Admitting: Pulmonary Disease

## 2010-12-23 NOTE — Telephone Encounter (Signed)
Spoke with pt.  She states forgot to ask [facility] when he called her if there were cultures done.  I advised that I am unsure of this, so will need to ask [facility] this and call her back next wk.  Pt verbalized understanding.

## 2010-12-26 ENCOUNTER — Inpatient Hospital Stay (HOSPITAL_COMMUNITY): Admission: RE | Admit: 2010-12-26 | Payer: 59 | Source: Ambulatory Visit

## 2010-12-26 ENCOUNTER — Observation Stay (HOSPITAL_COMMUNITY): Payer: 59

## 2010-12-26 NOTE — Telephone Encounter (Signed)
Let her know that all cultures are negative.  Takes 6 weeks for TB and fungus to be finalized, but original smears were negative.

## 2010-12-26 NOTE — Telephone Encounter (Signed)
[id number] x1 

## 2010-12-26 NOTE — Telephone Encounter (Signed)
Called and spoke with pt. Pt aware of culture results and KC's recs.

## 2010-12-27 ENCOUNTER — Other Ambulatory Visit: Payer: Self-pay | Admitting: Oncology

## 2010-12-27 DIAGNOSIS — R222 Localized swelling, mass and lump, trunk: Secondary | ICD-10-CM

## 2010-12-29 ENCOUNTER — Other Ambulatory Visit: Payer: Self-pay | Admitting: Interventional Radiology

## 2010-12-29 ENCOUNTER — Ambulatory Visit (HOSPITAL_COMMUNITY)
Admission: RE | Admit: 2010-12-29 | Discharge: 2010-12-29 | Disposition: A | Discharge status: Home / Self Care | Payer: 59 | Source: Ambulatory Visit | Attending: Oncology | Admitting: Oncology

## 2010-12-29 DIAGNOSIS — Z01812 Encounter for preprocedural laboratory examination: Secondary | ICD-10-CM | POA: Insufficient documentation

## 2010-12-29 DIAGNOSIS — R222 Localized swelling, mass and lump, trunk: Secondary | ICD-10-CM

## 2010-12-29 DIAGNOSIS — R918 Other nonspecific abnormal finding of lung field: Secondary | ICD-10-CM | POA: Insufficient documentation

## 2010-12-29 LAB — CBC
HCT: 32.7 % — ABNORMAL LOW (ref 36.0–46.0)
Hemoglobin: 10.5 g/dL — ABNORMAL LOW (ref 12.0–15.0)
MCH: 27.6 pg (ref 26.0–34.0)
MCHC: 32.1 g/dL (ref 30.0–36.0)
MCV: 85.8 fL (ref 78.0–100.0)
Platelets: 199 10*3/uL (ref 150–400)
RBC: 3.81 MIL/uL — ABNORMAL LOW (ref 3.87–5.11)
RDW: 18.1 % — ABNORMAL HIGH (ref 11.5–15.5)
WBC: 8.6 10*3/uL (ref 4.0–10.5)

## 2010-12-29 LAB — PROTIME-INR
INR: 1.01 (ref 0.00–1.49)
Prothrombin Time: 13.5 seconds (ref 11.6–15.2)

## 2010-12-29 LAB — APTT: aPTT: 20 seconds — ABNORMAL LOW (ref 24–37)

## 2011-01-03 ENCOUNTER — Other Ambulatory Visit: Payer: Self-pay | Admitting: Oncology

## 2011-01-03 ENCOUNTER — Encounter (HOSPITAL_BASED_OUTPATIENT_CLINIC_OR_DEPARTMENT_OTHER): Payer: 59 | Admitting: Oncology

## 2011-01-03 ENCOUNTER — Other Ambulatory Visit (HOSPITAL_COMMUNITY): Payer: 59

## 2011-01-03 DIAGNOSIS — D649 Anemia, unspecified: Secondary | ICD-10-CM

## 2011-01-03 DIAGNOSIS — C801 Malignant (primary) neoplasm, unspecified: Secondary | ICD-10-CM

## 2011-01-03 DIAGNOSIS — C7949 Secondary malignant neoplasm of other parts of nervous system: Secondary | ICD-10-CM

## 2011-01-03 DIAGNOSIS — M545 Low back pain, unspecified: Secondary | ICD-10-CM

## 2011-01-03 DIAGNOSIS — J984 Other disorders of lung: Secondary | ICD-10-CM

## 2011-01-03 DIAGNOSIS — M799 Soft tissue disorder, unspecified: Secondary | ICD-10-CM

## 2011-01-03 LAB — URINALYSIS, MICROSCOPIC - CHCC
Bilirubin (Urine): NEGATIVE
Blood: NEGATIVE
Glucose: NEGATIVE g/dL
Ketones: NEGATIVE mg/dL
Leukocyte Esterase: NEGATIVE
Nitrite: NEGATIVE
Protein: NEGATIVE mg/dL
RBC count: NEGATIVE (ref 0–2)
Specific Gravity, Urine: 1.015 (ref 1.003–1.035)
WBC, UA: NEGATIVE (ref 0–2)
pH: 6 (ref 4.6–8.0)

## 2011-01-03 LAB — CBC & DIFF AND RETIC
BASO%: 0.1 % (ref 0.0–2.0)
Basophils Absolute: 0 10*3/uL (ref 0.0–0.1)
EOS%: 0 % (ref 0.0–7.0)
Eosinophils Absolute: 0 10*3/uL (ref 0.0–0.5)
HCT: 34.6 % — ABNORMAL LOW (ref 34.8–46.6)
HGB: 11.2 g/dL — ABNORMAL LOW (ref 11.6–15.9)
Immature Retic Fract: 7.5 % (ref 0.00–10.70)
LYMPH%: 5.4 % — ABNORMAL LOW (ref 14.0–49.7)
MCH: 27.8 pg (ref 25.1–34.0)
MCHC: 32.4 g/dL (ref 31.5–36.0)
MCV: 85.9 fL (ref 79.5–101.0)
MONO#: 0.4 10*3/uL (ref 0.1–0.9)
MONO%: 4.4 % (ref 0.0–14.0)
NEUT#: 8.4 10*3/uL — ABNORMAL HIGH (ref 1.5–6.5)
NEUT%: 90.1 % — ABNORMAL HIGH (ref 38.4–76.8)
Platelets: 261 10*3/uL (ref 145–400)
RBC: 4.03 10*6/uL (ref 3.70–5.45)
RDW: 18.7 % — ABNORMAL HIGH (ref 11.2–14.5)
Retic %: 1.69 % — ABNORMAL HIGH (ref 0.50–1.50)
Retic Ct Abs: 68.11 10*3/uL (ref 18.30–72.70)
WBC: 9.3 10*3/uL (ref 3.9–10.3)
lymph#: 0.5 10*3/uL — ABNORMAL LOW (ref 0.9–3.3)

## 2011-01-03 LAB — MORPHOLOGY: PLT EST: ADEQUATE

## 2011-01-03 LAB — CHCC SMEAR

## 2011-01-04 ENCOUNTER — Other Ambulatory Visit (HOSPITAL_COMMUNITY)
Admission: RE | Admit: 2011-01-04 | Discharge: 2011-01-04 | Disposition: A | Discharge status: Home / Self Care | Payer: 59 | Source: Ambulatory Visit | Attending: Oncology | Admitting: Oncology

## 2011-01-04 ENCOUNTER — Other Ambulatory Visit: Payer: Self-pay | Admitting: Oncology

## 2011-01-04 ENCOUNTER — Encounter (HOSPITAL_BASED_OUTPATIENT_CLINIC_OR_DEPARTMENT_OTHER): Payer: 59 | Admitting: Oncology

## 2011-01-04 DIAGNOSIS — J984 Other disorders of lung: Secondary | ICD-10-CM

## 2011-01-04 DIAGNOSIS — M799 Soft tissue disorder, unspecified: Secondary | ICD-10-CM

## 2011-01-04 DIAGNOSIS — Z0389 Encounter for observation for other suspected diseases and conditions ruled out: Secondary | ICD-10-CM | POA: Insufficient documentation

## 2011-01-04 LAB — BONE MARROW EXAM: Bone Marrow Exam: 341

## 2011-01-05 LAB — IMMUNOFIXATION ELECTROPHORESIS
IgA: 196 mg/dL (ref 68–378)
IgG (Immunoglobin G), Serum: 675 mg/dL — ABNORMAL LOW (ref 694–1618)
IgM, Serum: 76 mg/dL (ref 60–263)
Total Protein, Serum Electrophoresis: 6 g/dL (ref 6.0–8.3)

## 2011-01-05 LAB — SEDIMENTATION RATE: Sed Rate: 4 mm/hr (ref 0–22)

## 2011-01-06 NOTE — Procedures (Signed)
NAME:  [name], [place] NO.:  [id number]   MEDICAL RECORD NO.:  [id number]          PATIENT TYPE:  OUT   LOCATION:  SLEEP CENTER                 FACILITY:  [facility]   PHYSICIAN:  Clinton D. Annamaria Boots, M.D. DATE OF BIRTH:  [birth year]   DATE OF STUDY:                              NOCTURNAL POLYSOMNOGRAM   REFERRING PHYSICIAN:  Dr. Carmie Kanner   INDICATION FOR STUDY:  Hypersomnia with sleep apnea.   Epworth sleepiness score 9/24, BMI 34, weight 200 pounds.   SLEEP ARCHITECTURE:  Total sleep time 378 minutes with sleep efficiency 75%.  Stage I was 12%, stage II 72%, stages III and IV were 2%, REM 14% of total  sleep time.  Latency to sleep onset 2.5 minutes, latency to REM 10 minutes,  awake after sleep onset 125 minutes, arousal index 41.   RESPIRATORY DATA:  Split study protocol.  Respiratory disturbance index  (RDI, AHI) 118.7 obstructive events per hour indicating severe obstructive  sleep apnea/hypopnea syndrome before CPAP.  This included 59 obstructive  apneas, 1 mixed apnea, and 211 hypopneas before CPAP.  Almost all sleep was  supine.  REM RDI 48.  CPAP was titrated to 10 CWP with incomplete control,  RDI 35.  The technician had some difficulty getting patient comfortable, but  they settled on a medium ResMed Ultra Mirage full-face mask with heated  humidifier.  She was also given a Breathe Right strip for nasal stuffiness  and ear plugs because she complained of hearing noises.   OXYGEN DATA:  Moderate snoring with oxygen desaturation to a nadir of 83%  before CPAP.  On CPAP control, saturation held to around 94 to 95% on room  air.   CARDIAC DATA:  Normal sinus rhythm.   MOVEMENT/PARASOMNIA:  Complaint of leg pain during the night.  Occasional  leg jerks.   IMPRESSION/RECOMMENDATIONS:  1.  Severe obstructive sleep apnea/hypopnea syndrome, RDI 118.7 per hour      with snoring and oxygen desaturation to 83%.  2.  Partial correction via CPAP to 10  CWP, RDI 35.2, using a medium ResMed      Ultra Mirage full-face mask with      heated humidifier.  Suggest inial home trial at 12 CWP.  It may help to      provide a sedative hypnotic to improve tolerance during initial      adjustment to CPAP if medically appropriate.      CDY/MEDQ  D:  11/27/2004 13:31:29  T:  11/27/2004 18:36:28  Job:  [id number]

## 2011-01-06 NOTE — Assessment & Plan Note (Signed)
[place] OFFICE NOTE   [name], [name]                       MRN:          [id number]  DATE:04/09/2006                            DOB:          [birth year]    HISTORY:  This is a 60 year old female who was evaluated February 15, 2006 for  anemia and Hemoccult positive stool.  See that dictation for details.  Her  most recent hemoglobin was January 24, 2006, at which time it measured 10.4.  Her MCV was 79.  She had had some Hemoccult cards earlier in the year which  returned positive.  She is noted to have reflux disease.  She was taking  Aleve several times per week for arthritis.  GI review of systems was really  unremarkable.  She underwent colonoscopy and upper endoscopy February 23, 2006.  Colonoscopy, including intubation of the terminal ileum, revealed only  diverticulosis.  Upper endoscopy revealed benign distal stricture of the  esophagus and a small sliding hiatal hernia.  No evidence of erosive change,  Barrett's, Cameron erosions, AVMs, or other lesions.  She was to take iron  b.i.d. and return at this time for a followup.  Aside from an ongoing head  cold, she feels well.  She has only been taking her iron once a day.  No GI  complaints.  She continues on Prilosec for reflux.   CURRENT MEDICATIONS:  1. Prilosec.  2. Lisinopril.  3. Amlodipine.  4. B6.  5. Multivitamin.  6. Calcium.  (She is no longer using Aleve).   PHYSICAL EXAMINATION:  GENERAL:  Well-appearing female in no acute distress.  VITAL SIGNS:  Blood pressure of 120/82, heart rate is 88, weight is 206.6  pounds.  HEENT:  Sclerae are anicteric.  Conjunctivae are pink.  EXTREMITIES:  Her hands reveal good color in the palms.   IMPRESSION:  Recent interval development of anemia associated with Hemoccult  positive stool.  Unremarkable colonoscopy and upper endoscopy, as described.  Suspect intermittent gastrointestinal mucosal lesions  from chronic Aleve.  She has been off the drug now for 6 weeks and on low-dose iron.   RECOMMENDATIONS:  1. Recheck CBC today.  If improved, continue iron once daily.  2. If CBC not improved, then increase iron to b.i.d. and recheck labs in 6      weeks.  If still abnormal, consider hematology evaluation and/or      capsule endoscopy.  3. Continue Prilosec for reflux disease.  4. Ongoing general medical care with Dr. Linna Darner.                                   Docia Chuck. Geri Seminole., MD   JNP/MedQ  DD:  04/09/2006  DT:  04/09/2006  Job #:  [id number]   cc:   Darrick Penna. Linna Darner, MD, FACP, FCCP  Lubertha South. Romine, MD

## 2011-01-06 NOTE — Assessment & Plan Note (Signed)
[place] OFFICE NOTE   [name], [name]                       MRN:          [id number]  DATE:11/29/2006                            DOB:          Apr 23, 1951    [name] was seen for comprehensive physical examination November 29, 2006.   She was essentially asymptomatic, but she does have some mild  arthralgias, mainly in the right 5th digit, her feet, and back.  She is  physically active, performing yard work but is not involved in any  cardiovascular exercise on a regular basis.   PAST MEDICAL HISTORY:  She has never been hospitalized.  In 2004,  colonoscopy revealed diverticulosis.  The colonoscopy was repeated in  July 2007 because of anemia and positive hemoccult stool cards.  This,  again, only revealed diverticulosis.   She has sleep apnea but is intolerant to CPAP.  She feels she is  sleeping better now because of decreased stresses.   FAMILY HISTORY:  Hypertension, peripheral neuropathy in her mother.  Her  father had mini-strokes, lung cancer, and arthritis.  Diabetes was found  in the maternal grandfather, maternal uncle, and maternal aunt.   She has smoked.  She drinks occasionally.  She is on no specific diet at  this time.   REVIEW OF SYSTEMS:  Completed and is essentially negative.  Her reflux  has improved dramatically by avoiding food pre-bedtime.   She does not have dysphagia, melena, or other warning signs.   In relationship to low back pain, she has no constitutional symptoms,  inguinal paresthesias or stool or urinary incontinence.   MEDICATIONS:  1. She is on 1500 mg of calcium and also vitamin D 400 international      units daily.  2. She is on Prilosec 20 mg daily.  3. Norvasc 5 mg daily.  4. Lisinopril 2 mg daily.   CODEINE CAUSES ITCHING.   She is 5 feet 5-1/2.  Fully clothed she weighs 218; this is an 11 pound  weight gain, pulse of 72 and regular, respiratory  rate 16, and blood  pressure 132/84.  She has minimal arterial narrowing.  Oral hygiene is  excellent.  Otolaryngologic exam is unremarkable.   Thyroid is normal to palpation.   Chest was clear with no increased work of breathing.  An S4 is noted  with no significant murmurs.  The dorsalis pedis pulses are slightly  decreased.   She has no lymphadenopathy or organomegaly.   The musculoskeletal exam reveals mild crepitus in the knees.  She is  able to bring the leg to  90-degrees without symptoms.  She is also able  to sit up from the exam table without help.  Deep tendon reflexes are 0-  1/2+ at the knees.   Labs were reviewed; her goal sheet was provided.   Because of the weight gain and the risk of diabetes based on family  history, I have recommended that she visit [email or web address], The Flat  Belly Diet, a carb restricted heart healthy diet.  I would also  recommend joining a gym for stretch aerobics, yoga,  and possibly water  aerobics.   No change was made in her medications; additional recommendations will  depend upon the lab results.     Darrick Penna. Linna Darner, MD,FACP,FCCP  Electronically Signed    [facility]/MedQ  DD: 11/29/2006  DT: 11/29/2006  Job #: [id number]

## 2011-01-11 ENCOUNTER — Other Ambulatory Visit: Payer: Self-pay | Admitting: Oncology

## 2011-01-11 DIAGNOSIS — J984 Other disorders of lung: Secondary | ICD-10-CM

## 2011-01-13 ENCOUNTER — Ambulatory Visit (HOSPITAL_COMMUNITY)
Admission: RE | Admit: 2011-01-13 | Discharge: 2011-01-13 | Disposition: A | Discharge status: Home / Self Care | Payer: 59 | Source: Ambulatory Visit | Attending: Oncology | Admitting: Oncology

## 2011-01-13 DIAGNOSIS — I517 Cardiomegaly: Secondary | ICD-10-CM | POA: Insufficient documentation

## 2011-01-13 DIAGNOSIS — R599 Enlarged lymph nodes, unspecified: Secondary | ICD-10-CM | POA: Insufficient documentation

## 2011-01-13 DIAGNOSIS — J984 Other disorders of lung: Secondary | ICD-10-CM | POA: Insufficient documentation

## 2011-01-13 DIAGNOSIS — K449 Diaphragmatic hernia without obstruction or gangrene: Secondary | ICD-10-CM | POA: Insufficient documentation

## 2011-01-13 MED ORDER — IOHEXOL 300 MG/ML  SOLN
100.0000 mL | Freq: Once | INTRAMUSCULAR | Status: AC | PRN
  Administered 2011-01-13: 100 mL via INTRAVENOUS

## 2011-01-14 ENCOUNTER — Inpatient Hospital Stay (HOSPITAL_COMMUNITY): Payer: 59

## 2011-01-14 ENCOUNTER — Inpatient Hospital Stay (HOSPITAL_COMMUNITY)
Admission: AD | Admit: 2011-01-14 | Discharge: 2011-01-25 | DRG: 167 | Disposition: A | Discharge status: Home / Self Care | Payer: 59 | Source: Ambulatory Visit | Attending: Thoracic Surgery | Admitting: Thoracic Surgery

## 2011-01-14 DIAGNOSIS — J9600 Acute respiratory failure, unspecified whether with hypoxia or hypercapnia: Principal | ICD-10-CM | POA: Diagnosis present

## 2011-01-14 DIAGNOSIS — R911 Solitary pulmonary nodule: Secondary | ICD-10-CM | POA: Diagnosis present

## 2011-01-14 DIAGNOSIS — M479 Spondylosis, unspecified: Secondary | ICD-10-CM | POA: Diagnosis present

## 2011-01-14 DIAGNOSIS — R609 Edema, unspecified: Secondary | ICD-10-CM

## 2011-01-14 DIAGNOSIS — D62 Acute posthemorrhagic anemia: Secondary | ICD-10-CM | POA: Diagnosis not present

## 2011-01-14 DIAGNOSIS — I10 Essential (primary) hypertension: Secondary | ICD-10-CM | POA: Diagnosis present

## 2011-01-14 DIAGNOSIS — R222 Localized swelling, mass and lump, trunk: Secondary | ICD-10-CM

## 2011-01-14 DIAGNOSIS — M899 Disorder of bone, unspecified: Secondary | ICD-10-CM | POA: Diagnosis present

## 2011-01-14 DIAGNOSIS — M7989 Other specified soft tissue disorders: Secondary | ICD-10-CM

## 2011-01-14 DIAGNOSIS — G4733 Obstructive sleep apnea (adult) (pediatric): Secondary | ICD-10-CM | POA: Diagnosis present

## 2011-01-14 LAB — DIFFERENTIAL
Basophils Absolute: 0.1 10*3/uL (ref 0.0–0.1)
Basophils Relative: 1 % (ref 0–1)
Eosinophils Absolute: 0 10*3/uL (ref 0.0–0.7)
Eosinophils Relative: 0 % (ref 0–5)
Lymphocytes Relative: 16 % (ref 12–46)
Lymphs Abs: 1 10*3/uL (ref 0.7–4.0)
Monocytes Absolute: 0.3 10*3/uL (ref 0.1–1.0)
Monocytes Relative: 5 % (ref 3–12)
Neutro Abs: 4.7 10*3/uL (ref 1.7–7.7)
Neutrophils Relative %: 78 % — ABNORMAL HIGH (ref 43–77)

## 2011-01-14 LAB — COMPREHENSIVE METABOLIC PANEL
ALT: 23 U/L (ref 0–35)
AST: 26 U/L (ref 0–37)
Albumin: 2.7 g/dL — ABNORMAL LOW (ref 3.5–5.2)
Alkaline Phosphatase: 84 U/L (ref 39–117)
BUN: 18 mg/dL (ref 6–23)
CO2: 26 mEq/L (ref 19–32)
Calcium: 8.7 mg/dL (ref 8.4–10.5)
Chloride: 98 mEq/L (ref 96–112)
Creatinine, Ser: 0.76 mg/dL (ref 0.4–1.2)
GFR calc Af Amer: >60 mL/min (ref >60)
GFR calc non Af Amer: >60 mL/min (ref >60)
Glucose, Bld: 138 mg/dL — ABNORMAL HIGH (ref 70–99)
Potassium: 3.8 mEq/L (ref 3.5–5.1)
Sodium: 135 mEq/L (ref 135–145)
Total Bilirubin: 0.2 mg/dL — ABNORMAL LOW (ref 0.3–1.2)
Total Protein: 6.6 g/dL (ref 6.0–8.3)

## 2011-01-14 LAB — CBC
HCT: 30.4 % — ABNORMAL LOW (ref 36.0–46.0)
Hemoglobin: 9.8 g/dL — ABNORMAL LOW (ref 12.0–15.0)
MCH: 27.6 pg (ref 26.0–34.0)
MCHC: 32.2 g/dL (ref 30.0–36.0)
MCV: 85.6 fL (ref 78.0–100.0)
Platelets: 268 10*3/uL (ref 150–400)
RBC: 3.55 MIL/uL — ABNORMAL LOW (ref 3.87–5.11)
RDW: 19.3 % — ABNORMAL HIGH (ref 11.5–15.5)
WBC: 6.1 10*3/uL (ref 4.0–10.5)

## 2011-01-14 LAB — URINALYSIS, ROUTINE W REFLEX MICROSCOPIC
Bilirubin Urine: NEGATIVE
Glucose, UA: NEGATIVE mg/dL
Hgb urine dipstick: NEGATIVE
Ketones, ur: NEGATIVE mg/dL
Nitrite: NEGATIVE
Protein, ur: NEGATIVE mg/dL
Specific Gravity, Urine: 1.013 (ref 1.005–1.030)
Urobilinogen, UA: 0.2 mg/dL (ref 0.0–1.0)
pH: 6.5 (ref 5.0–8.0)

## 2011-01-14 LAB — TOXOPLASMA ANTIBODIES- IGG AND  IGM
Toxoplasma Antibody- IgM: <0.1 IV (ref <0.90)
Toxoplasma IgG Ratio: 1 IU/mL (ref <6.4)

## 2011-01-14 LAB — SEDIMENTATION RATE: Sed Rate: 76 mm/hr — ABNORMAL HIGH (ref 0–22)

## 2011-01-14 LAB — MRSA PCR SCREENING: MRSA by PCR: NEGATIVE

## 2011-01-14 LAB — ANGIOTENSIN CONVERTING ENZYME: Angiotensin-Converting Enzyme: 1 U/L — ABNORMAL LOW (ref 8–52)

## 2011-01-14 LAB — D-DIMER, QUANTITATIVE: D-Dimer, Quant: 1.74 ug/mL-FEU — ABNORMAL HIGH (ref 0.00–0.48)

## 2011-01-15 ENCOUNTER — Inpatient Hospital Stay (HOSPITAL_COMMUNITY): Payer: 59

## 2011-01-15 LAB — LACTATE DEHYDROGENASE: LDH: 438 U/L — ABNORMAL HIGH (ref 94–250)

## 2011-01-15 LAB — PROTIME-INR
INR: 1.13 (ref 0.00–1.49)
Prothrombin Time: 14.7 seconds (ref 11.6–15.2)

## 2011-01-15 LAB — GLUCOSE, CAPILLARY: Glucose-Capillary: 97 mg/dL (ref 70–99)

## 2011-01-15 LAB — APTT: aPTT: 27 seconds (ref 24–37)

## 2011-01-15 LAB — PRO B NATRIURETIC PEPTIDE: Pro B Natriuretic peptide (BNP): 257.5 pg/mL — ABNORMAL HIGH (ref 0–125)

## 2011-01-15 LAB — HIV ANTIBODY (ROUTINE TESTING W REFLEX): HIV: NONREACTIVE

## 2011-01-16 ENCOUNTER — Inpatient Hospital Stay (HOSPITAL_COMMUNITY): Payer: 59

## 2011-01-16 ENCOUNTER — Inpatient Hospital Stay: Admission: EM | Admit: 2011-01-16 | Payer: Self-pay | Source: Ambulatory Visit | Admitting: Oncology

## 2011-01-16 DIAGNOSIS — M799 Soft tissue disorder, unspecified: Secondary | ICD-10-CM

## 2011-01-16 DIAGNOSIS — J984 Other disorders of lung: Secondary | ICD-10-CM

## 2011-01-16 LAB — CBC
HCT: 28.9 % — ABNORMAL LOW (ref 36.0–46.0)
Hemoglobin: 9.2 g/dL — ABNORMAL LOW (ref 12.0–15.0)
MCH: 27.8 pg (ref 26.0–34.0)
MCHC: 31.8 g/dL (ref 30.0–36.0)
MCV: 87.3 fL (ref 78.0–100.0)
Platelets: 226 10*3/uL (ref 150–400)
RBC: 3.31 MIL/uL — ABNORMAL LOW (ref 3.87–5.11)
RDW: 19.1 % — ABNORMAL HIGH (ref 11.5–15.5)
WBC: 5.9 10*3/uL (ref 4.0–10.5)

## 2011-01-16 LAB — BASIC METABOLIC PANEL
BUN: 20 mg/dL (ref 6–23)
CO2: 26 mEq/L (ref 19–32)
Calcium: 8.4 mg/dL (ref 8.4–10.5)
Chloride: 104 mEq/L (ref 96–112)
Creatinine, Ser: 0.58 mg/dL (ref 0.4–1.2)
GFR calc Af Amer: >60 mL/min (ref >60)
GFR calc non Af Amer: >60 mL/min (ref >60)
Glucose, Bld: 91 mg/dL (ref 70–99)
Potassium: 4 mEq/L (ref 3.5–5.1)
Sodium: 138 mEq/L (ref 135–145)

## 2011-01-16 LAB — FUNGUS CULTURE W SMEAR: Fungal Smear: NONE SEEN

## 2011-01-16 LAB — DIFFERENTIAL
Basophils Absolute: 0 10*3/uL (ref 0.0–0.1)
Basophils Relative: 1 % (ref 0–1)
Eosinophils Absolute: 0 10*3/uL (ref 0.0–0.7)
Eosinophils Relative: 0 % (ref 0–5)
Lymphocytes Relative: 18 % (ref 12–46)
Lymphs Abs: 1.1 10*3/uL (ref 0.7–4.0)
Monocytes Absolute: 0.4 10*3/uL (ref 0.1–1.0)
Monocytes Relative: 8 % (ref 3–12)
Neutro Abs: 4.3 10*3/uL (ref 1.7–7.7)
Neutrophils Relative %: 74 % (ref 43–77)

## 2011-01-16 LAB — POCT OCCULT BLOOD STOOL (DEVICE): Fecal Occult Bld: NEGATIVE

## 2011-01-17 ENCOUNTER — Inpatient Hospital Stay (HOSPITAL_COMMUNITY): Payer: 59

## 2011-01-17 DIAGNOSIS — R918 Other nonspecific abnormal finding of lung field: Secondary | ICD-10-CM

## 2011-01-17 DIAGNOSIS — J8409 Other alveolar and parieto-alveolar conditions: Secondary | ICD-10-CM

## 2011-01-17 DIAGNOSIS — R079 Chest pain, unspecified: Secondary | ICD-10-CM

## 2011-01-17 LAB — CBC
HCT: 27.1 % — ABNORMAL LOW (ref 36.0–46.0)
Hemoglobin: 8.5 g/dL — ABNORMAL LOW (ref 12.0–15.0)
MCH: 27.2 pg (ref 26.0–34.0)
MCHC: 31.4 g/dL (ref 30.0–36.0)
MCV: 86.9 fL (ref 78.0–100.0)
Platelets: 235 10*3/uL (ref 150–400)
RBC: 3.12 MIL/uL — ABNORMAL LOW (ref 3.87–5.11)
RDW: 19.1 % — ABNORMAL HIGH (ref 11.5–15.5)
WBC: 5 10*3/uL (ref 4.0–10.5)

## 2011-01-17 LAB — BASIC METABOLIC PANEL
BUN: 18 mg/dL (ref 6–23)
CO2: 29 mEq/L (ref 19–32)
Calcium: 8.1 mg/dL — ABNORMAL LOW (ref 8.4–10.5)
Chloride: 102 mEq/L (ref 96–112)
Creatinine, Ser: 0.6 mg/dL (ref 0.4–1.2)
GFR calc Af Amer: >60 mL/min (ref >60)
GFR calc non Af Amer: >60 mL/min (ref >60)
Glucose, Bld: 95 mg/dL (ref 70–99)
Potassium: 3.7 mEq/L (ref 3.5–5.1)
Sodium: 138 mEq/L (ref 135–145)

## 2011-01-17 LAB — ANA: Anti Nuclear Antibody(ANA): NEGATIVE

## 2011-01-17 NOTE — Consult Note (Signed)
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  Please see office notes for full details.  I initially saw her [name] on December 02, 2010, referred by Dr. 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  In anticipation of that visit, a CT scan of the chest, abdomen and pelvis was done, which unexpectedly showed bilateral pulmonary nodules up to 3 cm in size, but no mediastinal lymphadenopathy and no clear dominant lung mass in this nonsmoker.  There was no pathology in the abdomen and pelvis and specifically no organomegaly or adenopathy.  Due to my concern that her spinal cord was potentially compromised by the epidural mass, I elected to start her on Decadron 4 mg t.i.d. pending further evaluation.  I reviewed her initial radiographs with the interventional radiologist.  They felt that a bronchoscopy with biopsies would give the highest initial yield.  I referred the patient to Dr. Danton Sewer.  She underwent bronchoscopy with biopsies on Dec 20, 2010. Lung tissue was obtained, but no pathologic findings seen on that tissue.  Of note, he repeated a CT scan just prior to the biopsy and this unexpectedly showed decreased size in the lung nodules and the paraspinal mass.  Her symptoms also improved on the steroids with rather rapid reduction in previously significant back pain.  At that point, she was referred for a needle biopsy of the paraspinal mass done by Interventional  Radiology on Dec 29, 2010.  Results also returned nondiagnostic.  There was no obvious lymphoid infiltrate.  One small area of one blood vessel showed some lymphocytic infiltration in the vessel wall more consistent with possible vasculitis than lymphoma. Flow cytometry did not show any monoclonal lymphoid population.  Due to my concern that this might still be lymphoma given her response to the steroids, I did an iliac crest bone marrow aspiration and biopsy on Jan 04, 2011.  This was also negative for any obvious lymphoma.  I saw her in followup on Jan 03, 2011.  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  I began a steroid taper.  She called to report low-grade fevers on Jan 11, 2011.  I started her on Cipro on Jan 12, 2011.  A repeat CT scan of the chest was done on Jan 13, 2011 and I received the report late in the day.  This now showed extensive bilateral pulmonary infiltrates and cardiomegaly.  I called the patient with the results.  I thought that the findings were consistent with fluid overload and retention from the steroids.  I  offered her hospital admission Friday evening or a  trial of a diuretic over the weekend. Unfortunately by Saturday morning, she had rapidly progressive dyspnea and fever to 102 degrees.  She was admitted by Dr. Baird [place]ons.  Pertinent laboratory evaluation done prior to this admission showed initial normochromic anemia with a hemoglobin of 9.8, MCV 86.6 with a normal white count and differential, white count 7700, 84% neutrophils, 8% lymphocytes and platelets 434,000 done on Jan 01, 2011.  Chemistry profile showed mild elevation of alkaline phosphatase 131.  Remainder of liver functions were normal.  LDH normal at 152.  Subsequent urinalysis showed negative blood, negative protein.  Capital ESR 4 mm done on Jan 03, 2011, when she was still on steroids.  ANCA returned negative. Serum immunoglobulin  showed slight decrease in IgG immunoglobulin at 675 mg percent and no monoclonal protein seen on immunofixation electrophoresis.  PAST MEDICAL HISTORY: 1. Hypertension. 2. Degenerative arthritis of the spine.  No other medical or surgical     illness.  PREHOSPITAL MEDICATIONS: 1. Norvasc 5 mg daily. 2. Lisinopril 20 mg daily. 3. Prilosec 40 mg daily. 4. Multivitamins daily. 5. Tylox p.r.n. pain. 6. Decadron in tapering doses since Jan 03, 2011, 4 mg b.i.d. x4 days,     then daily x4 days and every other day x8 days, then stop. 7. Cipro 500 mg b.i.d. started on Jan 12, 2011.  Intolerance to codeine with itching, but she tolerates oxycodone.  FAMILY HISTORY:  Noncontributory.  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 Divorced.  No children.  Two healthy sisters.  Nonsmoker.  Rare alcohol.  PHYSICAL EXAMINATION:  GENERAL:  Shows a cushingoid Caucasian woman in no acute distress.  She is alert and oriented. VITAL SIGNS:  Blood pressure 107/65, pulse 92 and regular, respirations 20-29, maximum temperature since admission 99.6, oxygen saturation greater than or equal to 92% on a 50% Ventimask. HEENT:  Oropharynx, no erythema or exudate. LUNGS:  Remarkably clear and resonant to percussion. CARDIOVASCULAR:  Regular cardiac rhythm.  No gallop.  No rub. ABDOMEN:  Soft, nontender. EXTREMITIES:  2+ pedal edema. NEUROLOGIC:  Recent neurologic exam was normal, not repeated today.  LABORATORY DATA ON ADMISSION:  Hemoglobin 9.2, hematocrit 28.9, white count 5900, 74 neutrophils, 18 lymphocytes, platelets 226,000.  LDH now elevated at 458, previously normal at 152.  D-dimer 1.74.  BNP 257.5. HIV negative.  ACE 1.  Toxoplasmosis titers undetectable both IgG and IgM.  IMPRESSION: 1. Idiopathic left paraspinal soft tissue mass and bilateral pulmonary     nodules with nondiagnostic evaluation to date.  Possibilities include lymphoma or atypical vasculitis  given the partial response to steroids.  Atypical infection also a consideration.  2. Diffuse bilateral interstitial pulmonary infiltrates.  I initially thought that this was due to fluid overload/retention, due to steroids given cardiomegaly and peripheral edema.  However, BNP is not significantly elevated and at this point, we would have to consider that this is an atypical infection, which may also be related to the recent steroid use.  RECOMMENDATIONS:  Just prior to admission, I discussed her situation with Dr. Gwenette Greet and with her primary care physician, Dr. Unice Cobble. We mutually agreed that the only way we are going to obtain a diagnosis at this point in time is to have her evaluated for an open lung biopsy.  Thank you for this consultation.  I will follow closely with you.     Annia Belt, M.D., F.A.C.P.     JMG/MEDQ  D:  01/16/2011  T:  01/16/2011  Job:  [id number]  cc:   Tarri Fuller D. Annamaria Boots, MD, FCCP, [place], MD,FCCP Darrick Penna. Linna Darner, MD,FACP,FCCP Nicanor Alcon, M.D.  Electronically Signed by Murriel Hopper M.D. on 01/17/2011 06:59:20 AM

## 2011-01-18 ENCOUNTER — Inpatient Hospital Stay (HOSPITAL_COMMUNITY): Payer: 59

## 2011-01-18 LAB — BASIC METABOLIC PANEL
BUN: 16 mg/dL (ref 6–23)
CO2: 28 mEq/L (ref 19–32)
Calcium: 9.1 mg/dL (ref 8.4–10.5)
Chloride: 102 mEq/L (ref 96–112)
Creatinine, Ser: 0.55 mg/dL (ref 0.4–1.2)
GFR calc Af Amer: >60 mL/min (ref >60)
GFR calc non Af Amer: >60 mL/min (ref >60)
Glucose, Bld: 100 mg/dL — ABNORMAL HIGH (ref 70–99)
Potassium: 3.8 mEq/L (ref 3.5–5.1)
Sodium: 138 mEq/L (ref 135–145)

## 2011-01-18 LAB — ABO/RH: ABO/RH(D): O POS

## 2011-01-18 LAB — CBC
HCT: 27.8 % — ABNORMAL LOW (ref 36.0–46.0)
Hemoglobin: 8.8 g/dL — ABNORMAL LOW (ref 12.0–15.0)
MCH: 27.6 pg (ref 26.0–34.0)
MCHC: 31.7 g/dL (ref 30.0–36.0)
MCV: 87.1 fL (ref 78.0–100.0)
Platelets: 289 10*3/uL (ref 150–400)
RBC: 3.19 MIL/uL — ABNORMAL LOW (ref 3.87–5.11)
RDW: 18.9 % — ABNORMAL HIGH (ref 11.5–15.5)
WBC: 5.2 10*3/uL (ref 4.0–10.5)

## 2011-01-18 NOTE — Consult Note (Signed)
[name], TUGMAN NO.:  [id number]  MEDICAL RECORD NO.:  [id number]           PATIENT TYPE:  I  LOCATION:  [id number]                         FACILITY:  [facility]  PHYSICIAN:  Nicanor Alcon, M.D. DATE OF BIRTH:  04/07/1951  DATE OF CONSULTATION: DATE OF DISCHARGE:                                CONSULTATION   CHIEF COMPLAINT:  Shortness of breath.  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  She subsequently had a fall and had a penetrating wound in her lower left leg.  She had a diagnosis of erythema nodosum and started having low-grade fevers and started having some left back pain, which a MRI showed a mass at the T11-12 foramen as well as T8 foramen, and also followup CT showed multiple lung nodules. She had multiple biopsies of both of these, which were all nondiagnostic.  Biopsy at this time is nondiagnostic study.  PET scan was positive at this time.  She was started on Decadron with decrease in her thighs and nodules and then she started having more fever and developed airspace.  The CT scan showed diffuse airspace disease.  She is admitted for continued workup.  There is a question whether these nodules are still there or not.  She was on 12 mg of Decadron for the past month, and has been weaned off with the last dose being taken 1 day prior to admission.  She had temperature of 102.  She was also treated with Lasix with BNP being slightly elevated.  We were asked to see her for possible lung biopsy or rebiopsy.  She is on BiPAP at night.  PAST MEDICAL HISTORY:  She is allergic to CODEINE and LASIX.  MEDICATIONS:  Norvasc, Prinivil, furosemide, Lasix, heparin, Pepcid, oxycodone.  FAMILY HISTORY:  Noncontributory.  SOCIAL HISTORY:  Occasional alcohol.  Works as Writer.  REVIEW OF SYSTEMS:  CARDIAC:  No angina or atrial  fibrillation. PULMONARY:  See history of present illness.  HEMATOLOGICAL:  See history of present illness.  MUSCULOSKELETAL:  See history of present illness. NEUROLOGICAL:  She has some headaches.  No seizures.  VASCULAR:  No claudication, DVT, TIAs.  PSYCHIATRIC:  No depression or nervousness. EYE/ENT:  No change in eyesight or hearing.  PHYSICAL EXAMINATION:  GENERAL:  She is a cushingoid-appearing Caucasian female with oxygen in no acute distress. HEAD, EYES, EARS, NOSE, AND THROAT:  Unremarkable except for cushingoid appearance. CHEST:  Bilateral wheezes. HEART:  Regular sinus rhythm. ABDOMEN:  Soft. EXTREMITIES:  Pulses are 2+, edema 1+. NEUROLOGICAL:  She is oriented x3.  Sensory and motor intact.  Cranial nerves intact.  IMPRESSION: 1. Bilateral pulmonary infiltrates, rule out vasculitis, rule out     autoimmune disorder.  Rule out opportunistic infection. 2. Bilateral pulmonary nodules. 3. Status post left paraspinous mass.  PLAN:  Follow and possible right VATS lung biopsy.     Nicanor Alcon, M.D.     DPB/MEDQ  D:  01/17/2011  T:  01/17/2011  Job:  [id number]  Electronically Signed by Ethelda Chick M.D. on 01/18/2011 10:54:18 AM

## 2011-01-19 ENCOUNTER — Inpatient Hospital Stay (HOSPITAL_COMMUNITY): Payer: 59

## 2011-01-19 ENCOUNTER — Other Ambulatory Visit: Payer: Self-pay | Admitting: Thoracic Surgery

## 2011-01-19 DIAGNOSIS — R918 Other nonspecific abnormal finding of lung field: Secondary | ICD-10-CM

## 2011-01-19 DIAGNOSIS — R222 Localized swelling, mass and lump, trunk: Secondary | ICD-10-CM

## 2011-01-19 LAB — POCT I-STAT 3, ART BLOOD GAS (G3+)
Acid-Base Excess: 2 mmol/L (ref 0.0–2.0)
Bicarbonate: 27.6 mEq/L — ABNORMAL HIGH (ref 20.0–24.0)
O2 Saturation: 99 %
Patient temperature: 99.5
TCO2: 29 mmol/L (ref 0–100)
pCO2 arterial: 47.2 mmHg — ABNORMAL HIGH (ref 35.0–45.0)
pH, Arterial: 7.376 (ref 7.350–7.400)
pO2, Arterial: 158 mmHg — ABNORMAL HIGH (ref 80.0–100.0)

## 2011-01-19 LAB — CBC
HCT: 26.2 % — ABNORMAL LOW (ref 36.0–46.0)
Hemoglobin: 8.2 g/dL — ABNORMAL LOW (ref 12.0–15.0)
MCH: 27.3 pg (ref 26.0–34.0)
MCHC: 31.3 g/dL (ref 30.0–36.0)
MCV: 87.3 fL (ref 78.0–100.0)
Platelets: 294 10*3/uL (ref 150–400)
RBC: 3 MIL/uL — ABNORMAL LOW (ref 3.87–5.11)
RDW: 18.5 % — ABNORMAL HIGH (ref 11.5–15.5)
WBC: 5.9 10*3/uL (ref 4.0–10.5)

## 2011-01-19 LAB — ANTI-NEUTROPHIL ANTIBODY

## 2011-01-20 ENCOUNTER — Inpatient Hospital Stay (HOSPITAL_COMMUNITY): Payer: 59

## 2011-01-20 LAB — POCT I-STAT 3, ART BLOOD GAS (G3+)
Acid-Base Excess: 2 mmol/L (ref 0.0–2.0)
Acid-Base Excess: 3 mmol/L — ABNORMAL HIGH (ref 0.0–2.0)
Bicarbonate: 27.5 mEq/L — ABNORMAL HIGH (ref 20.0–24.0)
Bicarbonate: 27.5 mEq/L — ABNORMAL HIGH (ref 20.0–24.0)
O2 Saturation: 98 %
O2 Saturation: 97 %
Patient temperature: 98.1
Patient temperature: 99.8
TCO2: 29 mmol/L (ref 0–100)
TCO2: 29 mmol/L (ref 0–100)
pCO2 arterial: 44.1 mmHg (ref 35.0–45.0)
pCO2 arterial: 44 mmHg (ref 35.0–45.0)
pH, Arterial: 7.403 — ABNORMAL HIGH (ref 7.350–7.400)
pH, Arterial: 7.407 — ABNORMAL HIGH (ref 7.350–7.400)
pO2, Arterial: 112 mmHg — ABNORMAL HIGH (ref 80.0–100.0)
pO2, Arterial: 94 mmHg (ref 80.0–100.0)

## 2011-01-20 LAB — CBC
HCT: 21.3 % — ABNORMAL LOW (ref 36.0–46.0)
Hemoglobin: 6.9 g/dL — CL (ref 12.0–15.0)
MCH: 28.4 pg (ref 26.0–34.0)
MCHC: 32.4 g/dL (ref 30.0–36.0)
MCV: 87.7 fL (ref 78.0–100.0)
Platelets: 238 10*3/uL (ref 150–400)
RBC: 2.43 MIL/uL — ABNORMAL LOW (ref 3.87–5.11)
RDW: 18.4 % — ABNORMAL HIGH (ref 11.5–15.5)
WBC: 6.9 10*3/uL (ref 4.0–10.5)

## 2011-01-20 LAB — CULTURE, BLOOD (ROUTINE X 2)
Culture  Setup Time: 201205262019
Culture  Setup Time: 201205262019
Culture: NO GROWTH
Culture: NO GROWTH

## 2011-01-20 LAB — BASIC METABOLIC PANEL
BUN: 10 mg/dL (ref 6–23)
CO2: 29 mEq/L (ref 19–32)
Calcium: 7.8 mg/dL — ABNORMAL LOW (ref 8.4–10.5)
Chloride: 104 mEq/L (ref 96–112)
Creatinine, Ser: <0.47 mg/dL (ref 0.4–1.2)
Glucose, Bld: 104 mg/dL — ABNORMAL HIGH (ref 70–99)
Potassium: 4 mEq/L (ref 3.5–5.1)
Sodium: 136 mEq/L (ref 135–145)

## 2011-01-21 ENCOUNTER — Inpatient Hospital Stay (HOSPITAL_COMMUNITY): Payer: 59

## 2011-01-21 DIAGNOSIS — J8409 Other alveolar and parieto-alveolar conditions: Secondary | ICD-10-CM

## 2011-01-21 DIAGNOSIS — J9600 Acute respiratory failure, unspecified whether with hypoxia or hypercapnia: Secondary | ICD-10-CM

## 2011-01-21 DIAGNOSIS — R079 Chest pain, unspecified: Secondary | ICD-10-CM

## 2011-01-21 LAB — CBC
HCT: 24.8 % — ABNORMAL LOW (ref 36.0–46.0)
Hemoglobin: 7.9 g/dL — ABNORMAL LOW (ref 12.0–15.0)
MCH: 27.1 pg (ref 26.0–34.0)
MCHC: 31.9 g/dL (ref 30.0–36.0)
MCV: 84.9 fL (ref 78.0–100.0)
Platelets: 260 10*3/uL (ref 150–400)
RBC: 2.92 MIL/uL — ABNORMAL LOW (ref 3.87–5.11)
RDW: 18.4 % — ABNORMAL HIGH (ref 11.5–15.5)
WBC: 8 10*3/uL (ref 4.0–10.5)

## 2011-01-21 LAB — CROSSMATCH
ABO/RH(D): O POS
Antibody Screen: NEGATIVE
Unit division: 0
Unit division: 0

## 2011-01-21 LAB — COMPREHENSIVE METABOLIC PANEL
ALT: 12 U/L (ref 0–35)
AST: 18 U/L (ref 0–37)
Albumin: 1.9 g/dL — ABNORMAL LOW (ref 3.5–5.2)
Alkaline Phosphatase: 73 U/L (ref 39–117)
BUN: 7 mg/dL (ref 6–23)
CO2: 28 mEq/L (ref 19–32)
Calcium: 7.7 mg/dL — ABNORMAL LOW (ref 8.4–10.5)
Chloride: 103 mEq/L (ref 96–112)
Creatinine, Ser: <0.47 mg/dL (ref 0.4–1.2)
Glucose, Bld: 107 mg/dL — ABNORMAL HIGH (ref 70–99)
Potassium: 4 mEq/L (ref 3.5–5.1)
Sodium: 137 mEq/L (ref 135–145)
Total Bilirubin: 0.3 mg/dL (ref 0.3–1.2)
Total Protein: 5.2 g/dL — ABNORMAL LOW (ref 6.0–8.3)

## 2011-01-22 ENCOUNTER — Inpatient Hospital Stay (HOSPITAL_COMMUNITY): Payer: 59

## 2011-01-22 LAB — CBC
HCT: 25 % — ABNORMAL LOW (ref 36.0–46.0)
Hemoglobin: 7.9 g/dL — ABNORMAL LOW (ref 12.0–15.0)
MCH: 27.2 pg (ref 26.0–34.0)
MCHC: 31.6 g/dL (ref 30.0–36.0)
MCV: 86.2 fL (ref 78.0–100.0)
Platelets: 302 10*3/uL (ref 150–400)
RBC: 2.9 MIL/uL — ABNORMAL LOW (ref 3.87–5.11)
RDW: 18.3 % — ABNORMAL HIGH (ref 11.5–15.5)
WBC: 7.1 10*3/uL (ref 4.0–10.5)

## 2011-01-22 LAB — BASIC METABOLIC PANEL
BUN: 9 mg/dL (ref 6–23)
CO2: 30 mEq/L (ref 19–32)
Calcium: 8 mg/dL — ABNORMAL LOW (ref 8.4–10.5)
Chloride: 100 mEq/L (ref 96–112)
Creatinine, Ser: <0.47 mg/dL (ref 0.4–1.2)
Glucose, Bld: 102 mg/dL — ABNORMAL HIGH (ref 70–99)
Potassium: 3.8 mEq/L (ref 3.5–5.1)
Sodium: 136 mEq/L (ref 135–145)

## 2011-01-22 LAB — PRO B NATRIURETIC PEPTIDE: Pro B Natriuretic peptide (BNP): 107.2 pg/mL (ref 0–125)

## 2011-01-23 ENCOUNTER — Inpatient Hospital Stay (HOSPITAL_COMMUNITY): Payer: 59

## 2011-01-23 DIAGNOSIS — J8409 Other alveolar and parieto-alveolar conditions: Secondary | ICD-10-CM

## 2011-01-23 DIAGNOSIS — J9600 Acute respiratory failure, unspecified whether with hypoxia or hypercapnia: Secondary | ICD-10-CM

## 2011-01-23 LAB — BASIC METABOLIC PANEL
BUN: 7 mg/dL (ref 6–23)
CO2: 32 mEq/L (ref 19–32)
Calcium: 8.3 mg/dL — ABNORMAL LOW (ref 8.4–10.5)
Chloride: 99 mEq/L (ref 96–112)
Creatinine, Ser: <0.47 mg/dL (ref 0.4–1.2)
Glucose, Bld: 102 mg/dL — ABNORMAL HIGH (ref 70–99)
Potassium: 3.9 mEq/L (ref 3.5–5.1)
Sodium: 136 mEq/L (ref 135–145)

## 2011-01-23 LAB — BODY FLUID CULTURE: Culture: NO GROWTH

## 2011-01-23 LAB — TISSUE CULTURE
Culture: NO GROWTH
Gram Stain: NONE SEEN

## 2011-01-23 NOTE — Op Note (Signed)
  [name], [name]              ACCOUNT NO.:  [id number]  MEDICAL RECORD NO.:  [id number]           PATIENT TYPE:  I  LOCATION:  2308                         FACILITY:  [facility]  PHYSICIAN:  Nicanor Alcon, M.D. DATE OF BIRTH:  July 29, 1951  DATE OF PROCEDURE: DATE OF DISCHARGE:                              OPERATIVE REPORT   PREOPERATIVE DIAGNOSIS:  Bilateral pulmonary infiltrates.  POSTOPERATIVE DIAGNOSIS:  Bilateral pulmonary infiltrates.  OPERATION PERFORMED:  Left VATS lung biopsy x2.  SURGEON:  Nicanor Alcon, MD  FIRST ASSISTANT:  Lars Pinks, PA-C  ANESTHESIA:  General anesthesia.  This 60 year old Caucasian female which had been treated for bilateral pulmonary nodules with steroids and then developed an ARDS pattern and was brought to the operating room for biopsy after general anesthesia. Dual-lumen tube was inserted and she was turned to the left lateral thoracotomy position, was prepped and draped in usual sterile manner. Two trocar sites were made anterior and posterior axillary line seventh intercostal space the midaxillary line at the fifth intercostal space. Two trocars were inserted.  Pictures were taken showed an acute inflammatory condition in the lung.  We first grabbed the lingula with a Kaiser ring forceps through the mid trocar sites and then coming in posteriorly resected it with two applications of the Covidien stapler and we sent it for culture.  Frozen section showed an acute lung injury. We then dissected up the superior segment where there was questionable previous pulmonary nodule there and then came off across the bottom of the pulmonary segment of the superior segment with Covidien stapler with several applications and sent that for permanent section.  Two chest tubes were placed, 24 chest tube anteriorly to trocar site and up to 28 posteriorly trocar site.  A Marcaine block was done in the usual fashion.  A single On-Q inserted in  the usual fashion.  The third trocar site was closed with 2-0 Vicryl and subcuticular stitch.  The patient was returned to the intensive care unit in serious condition.     Nicanor Alcon, M.D.     DPB/MEDQ  D:  01/19/2011  T:  01/20/2011  Job:  [id number]  Electronically Signed by Ethelda Chick M.D. on 01/23/2011 12:51:33 PM

## 2011-01-24 ENCOUNTER — Inpatient Hospital Stay (HOSPITAL_COMMUNITY): Payer: 59

## 2011-01-25 ENCOUNTER — Inpatient Hospital Stay (HOSPITAL_COMMUNITY): Payer: 59

## 2011-01-25 LAB — CBC
HCT: 27.8 % — ABNORMAL LOW (ref 36.0–46.0)
Hemoglobin: 8.7 g/dL — ABNORMAL LOW (ref 12.0–15.0)
MCH: 26.9 pg (ref 26.0–34.0)
MCHC: 31.3 g/dL (ref 30.0–36.0)
MCV: 86.1 fL (ref 78.0–100.0)
Platelets: 401 10*3/uL — ABNORMAL HIGH (ref 150–400)
RBC: 3.23 MIL/uL — ABNORMAL LOW (ref 3.87–5.11)
RDW: 18.1 % — ABNORMAL HIGH (ref 11.5–15.5)
WBC: 7.1 10*3/uL (ref 4.0–10.5)

## 2011-01-25 LAB — BASIC METABOLIC PANEL
BUN: 9 mg/dL (ref 6–23)
CO2: 30 mEq/L (ref 19–32)
Calcium: 8.4 mg/dL (ref 8.4–10.5)
Chloride: 96 mEq/L (ref 96–112)
Creatinine, Ser: <0.47 mg/dL (ref 0.4–1.2)
Glucose, Bld: 97 mg/dL (ref 70–99)
Potassium: 3.5 mEq/L (ref 3.5–5.1)
Sodium: 135 mEq/L (ref 135–145)

## 2011-01-25 NOTE — Op Note (Signed)
  NAME:  HENNESY, ICENHOWER NO.:  [id number]  MEDICAL RECORD NO.:  [id number]           PATIENT TYPE:  O  LOCATION:  SDSC                         FACILITY:  [facility]  PHYSICIAN:  Kathee Delton, MD,FCCPDATE OF BIRTH:  08[birth year]52  DATE OF PROCEDURE:  12/20/2010 DATE OF DISCHARGE:  12/20/2010                              OPERATIVE REPORT   PROCEDURE:  Flexible fiberoptic bronchoscopy with electromagnetic navigation.  OPERATOR:  Kathee Delton, MD, FCCP  ASSISTANT:  Collene Gobble, MD  INDICATIONS FOR PROCEDURE:  Bilateral lung masses of unknown origin.  ANESTHESIA:  General endotracheal intubation.  DESCRIPTION:  After obtaining informed consent and under close cardiopulmonary monitoring under general anesthesia, the fiberoptic scope was passed through the endotracheal tube down to the level of the carina where a serial airway exam was done.  There was no endobronchial abnormality or other mucosal changes noted.  Scope was then removed and the electromagnetic navigational device was passed through the scope with the locatable guide being noted at the tip of the scope.  Scope was then reintroduced to the endotracheal tube and passed down to the level of the carina again.  Automatic restoration was then done and the scope was passed out to the lesion in the posterior segment of the right upper lobe under electromagnetic guidance.  Good position was obtained and with fluoroscopic guidance, bronchial brushes, transbronchial needle aspiration, transbronchial lung biopsies, and BAL were all done with good material being obtained.  The scope was then passed into the left tracheobronchial tree and the superior segment of the left lower lobe where it was very difficult to obtain a good position to biopsy the second lesion.  Specimens were unable to be obtained in this area that were felt to be adequate.  The procedure was then ended, and the patient was transferred to  the PACU in good condition.  There were no immediate complications noted, and a chest x-ray post procedure is pending at the time of dictation to rule out pneumothorax post transbronchial lung biopsies.     Kathee Delton, MD,FCCP     KMC/MEDQ  D:  12/20/2010  T:  12/21/2010  Job:  [id number]  Electronically Signed by Danton Sewer MDFCCP on 01/25/2011 01:03:41 PM

## 2011-01-27 ENCOUNTER — Telehealth: Payer: Self-pay | Admitting: Pulmonary Disease

## 2011-01-27 NOTE — Telephone Encounter (Signed)
Called and discussed case with pt.  Have outlined 3 plans 1.  Will call Dian Situ and discuss with her personally 2. [facility]. 3. Will probably end up treating her with prednisone and cytoxan.  She is ok with the above  Megan, please call path at [facility] and see if they will send this pt's biopsies to [facility] in [place] for a second opinion.

## 2011-01-27 NOTE — Telephone Encounter (Signed)
Spoke with pt.  She states that Dr Arlyce Dice did bronch last week and she is requesting that [facility] call her with results. I asked if she had asked [facility] for these, but she states that she thought that [facility] told her that he would be sharing these results with her. She states very anxious about results. I advised that I do not see her results in the computer yet, but will forward msg to [facility] to let him know she is requesting these. Pt verbalized understanding.

## 2011-01-30 NOTE — Telephone Encounter (Signed)
Called and spoke with Jeani Hawking at [phone] in [facility].  Requested pt's biopsy slides be sent to [facility] in [place], [place].  Jeani Hawking is wanting to know which slides to send? [place], do you just want the biopsy slides that were just done on 5/31? Or from previously as well?  Also need to know the name of the physician that is to be reviewing these slides?  [place], please advise.  Thanks.

## 2011-01-31 ENCOUNTER — Other Ambulatory Visit: Payer: Self-pay | Admitting: Thoracic Surgery

## 2011-01-31 DIAGNOSIS — D381 Neoplasm of uncertain behavior of trachea, bronchus and lung: Secondary | ICD-10-CM

## 2011-02-01 ENCOUNTER — Ambulatory Visit (INDEPENDENT_AMBULATORY_CARE_PROVIDER_SITE_OTHER): Payer: Self-pay | Admitting: Thoracic Surgery

## 2011-02-01 ENCOUNTER — Ambulatory Visit
Admission: RE | Admit: 2011-02-01 | Discharge: 2011-02-01 | Disposition: A | Discharge status: Home / Self Care | Payer: 59 | Source: Ambulatory Visit | Attending: Thoracic Surgery | Admitting: Thoracic Surgery

## 2011-02-01 DIAGNOSIS — D381 Neoplasm of uncertain behavior of trachea, bronchus and lung: Secondary | ICD-10-CM

## 2011-02-01 DIAGNOSIS — J8410 Pulmonary fibrosis, unspecified: Secondary | ICD-10-CM

## 2011-02-01 LAB — AFB CULTURE WITH SMEAR (NOT AT ARMC): Acid Fast Smear: NONE SEEN

## 2011-02-02 NOTE — Letter (Signed)
February 01, 2011  Kathee Delton, MD, FCCP [address], [place] [address]  Re:  BERDEAN, THIELEMANN                DOB:  1950/12/19  Dear Lanny Hurst,  I saw the patient back in the office today, removed her chest tube stitches.  She has had some drainage from the anterior chest tube site, but they are healing well.  She does have 3+ to 4+ pedal edema in which she took 20 of Lasix for 3 days with little effect.  I told her to go ahead and take 40 of Lasix a day until she sees you on Monday.  She also has some bruisability around her umbilicus and some erythema and petechiae in the legs.  I am somewhat concerned about what is going on. She takes oxycodone as well as tramadol.  Her blood pressure was 106/72, pulse 96, respirations 20, sats were 98% on 2 liters and her chest x-ray did show some improvement in her lung from her previous studies.  I will see her back again in 2 weeks to check on the status of her chest tube sites.  Nicanor Alcon, M.D. Electronically Signed  DPB/MEDQ  D:  02/01/2011  T:  02/02/2011  Job:  [id number]

## 2011-02-05 ENCOUNTER — Telehealth: Payer: Self-pay | Admitting: Internal Medicine

## 2011-02-05 NOTE — Telephone Encounter (Signed)
States recent s/p lung biopsy. CHronic edema. Now mouth is red and raw and sore and difficult to eat; past few days. Not on inhalers. DEnies dyspnea but coughing thick, globby, yellow green since discharge. Not on current antibiotics. Not wheezing. Allergies are codeine, latex and adhesives. Has appt with Dr Gwenette Greet tomorrow 02/06/2011 AM.   Durward Fortes I can call in abx for bronchitis but uncler what is going on in throat. However, she  Prefers to wait till she is seen by Dr Gwenette Greet 02/06/2011 am

## 2011-02-06 ENCOUNTER — Encounter: Payer: Self-pay | Admitting: Pulmonary Disease

## 2011-02-06 ENCOUNTER — Ambulatory Visit (INDEPENDENT_AMBULATORY_CARE_PROVIDER_SITE_OTHER): Payer: 59 | Admitting: Pulmonary Disease

## 2011-02-06 ENCOUNTER — Inpatient Hospital Stay (HOSPITAL_COMMUNITY): Admission: AD | Admit: 2011-02-06 | Payer: Self-pay | Source: Ambulatory Visit | Admitting: Pulmonary Disease

## 2011-02-06 VITALS — BP 102/60 | HR 120 | Temp 97.9°F

## 2011-02-06 DIAGNOSIS — J8409 Other alveolar and parieto-alveolar conditions: Secondary | ICD-10-CM

## 2011-02-06 NOTE — Telephone Encounter (Signed)
[name], just get them to send her slides from her recent lung biopsy to whoever is their lung pathologist their.  They can send the same history that was sent to [facility].  Let them know this is a patient request.

## 2011-02-06 NOTE — Patient Instructions (Signed)
Please have the admitting physician at [facility] to call me for info. I would be happy to see you back for management if needed.  You can get your medical records downstairs to take with you.

## 2011-02-06 NOTE — Telephone Encounter (Signed)
Called and spoke with Jess at [facility] Pathology and requested lung biopsy slides be sent to [facility] ( this was per the pt's request today when she was seen in the office) Jess stated she will send out slides to [facility] tomorrow morning.

## 2011-02-06 NOTE — Progress Notes (Signed)
  Subjective:    Patient ID: [name], female    DOB: Jun 20, 1951, 59 y.o.   MRN: [id number]  HPI The pt comes in today for f/u after her hospitalization for vats biopsy.  She was found to have a lymphohistiocytic infiltrate, some areas of organizing pna, and some small thrombi in small blood vessels.  I spoke with Dr Dian Situ personally this am, and she has no other suggestions.  She did have a very positive c-anca, and this is very suggestive of Wegeners, which could really fit the clinical picture.  Dr Dian Situ suggested the path may not fit this because she has been on steroids for a period of time before the biopsy.  The pt is feels she is doing poorly at home.  She has malaise, worsening LE edema, and a new rash involving her left calf posteriorly.  Denies fever.    Review of Systems  Constitutional: Positive for appetite change. Negative for fever and unexpected weight change.  HENT: Positive for nosebleeds, congestion, sore throat, mouth sores, trouble swallowing and sinus pressure. Negative for ear pain, rhinorrhea, sneezing, dental problem and postnasal drip.   Eyes: Negative for redness and itching.  Respiratory: Positive for cough and shortness of breath. Negative for chest tightness and wheezing.   Cardiovascular: Positive for leg swelling. Negative for palpitations.  Gastrointestinal: Negative for nausea and vomiting.  Genitourinary: Negative for dysuria.  Musculoskeletal: Positive for joint swelling.  Skin: Positive for rash.  Neurological: Positive for dizziness and headaches.  Hematological: Bruises/bleeds easily.  Psychiatric/Behavioral: Negative for dysphoric mood. The patient is not nervous/anxious.        Objective:   Physical Exam Obese female in nad.  Appears tired, no increased wob. Nares without discharge or purulence, oropharynx with thrush Chest with basilar crackles, left greater than right, good airflow, no wheezing. Cor with mild tachy, regular LE  with 2+ edema, ecchymotic areas on back of legs, especially on left.   ?vasculitis Alert and oriented, moves all 4        Assessment & Plan:

## 2011-02-11 DIAGNOSIS — J8409 Other alveolar and parieto-alveolar conditions: Secondary | ICD-10-CM

## 2011-02-11 HISTORY — DX: Other alveolar and parieto-alveolar conditions: J84.09

## 2011-02-11 NOTE — Assessment & Plan Note (Signed)
The pt has pulmonary infiltrates that are unable to be "labeled" despite vats biopsy.  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  The path may not be classic due to her ongoing steroids for back/spine issues prior to bx.  The pt is doing poorly currently, and has worsening LE edema and a rash that is unexplained.  I think she needs admission for IV steroids, repeat ANCA and followed by cytoxan if really +, LE venous dopplers, and a biopsy of her LE rash to r/o vasculitis.  After a long discussion, the pt decided that she wishes to go to [facility].  She wants to go to the ER there and be admitted.  I have asked her to pick up her records downstairs to take with her, and have given her my number so that her admitting md can contact me if questions.

## 2011-02-14 ENCOUNTER — Other Ambulatory Visit: Payer: Self-pay | Admitting: Thoracic Surgery

## 2011-02-14 DIAGNOSIS — D381 Neoplasm of uncertain behavior of trachea, bronchus and lung: Secondary | ICD-10-CM

## 2011-02-15 ENCOUNTER — Ambulatory Visit: Payer: Self-pay | Admitting: Thoracic Surgery

## 2011-02-17 LAB — FUNGUS CULTURE W SMEAR
Fungal Smear: NONE SEEN
Fungal Smear: NONE SEEN

## 2011-02-20 NOTE — Discharge Summary (Signed)
NAMETYNISHIA, AVARA NO.:  [id number]  MEDICAL RECORD NO.:  [id number]  LOCATION:  2018                         FACILITY:  [facility]  PHYSICIAN:  Nicanor Alcon, M.D. DATE OF BIRTH:  1950/12/10  DATE OF ADMISSION:  01/14/2011 DATE OF DISCHARGE:  01/25/2011                              DISCHARGE SUMMARY   PRIMARY ADMITTING DIAGNOSES: 1. Acute respiratory failure. 2. Bilateral pulmonary infiltrates.  ADDITIONAL/DISCHARGE DIAGNOSES: 1. Acute respiratory failure. 2. Bilateral pulmonary infiltrates. 3. Obstructive sleep apnea on CPAP. 4. Hypertension. 5. Bilateral lung nodules. 6. Degenerative arthritis of the spine. 7. 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 8. Postoperative acute blood loss anemia.  PROCEDURES PERFORMED:  Left VATs with lung biopsies x2.  HISTORY:  The patient is a 60 year old female who recently developed low- grade fevers and back pain.  An MRI showed a mass at the T11-T12 foramen and she was seen as an outpatient by Dr. Sherwood Gambler.  Subsequent workup including a CT of the chest revealed multiple bilateral pulmonary nodules.  PET scan had been performed which showed both hypermetabolic activity in both the lung nodules and spinal lesion.  A needle biopsy was performed on the paraspinal mass on Dec 29, 2010, which was nondiagnostic.  She had previously been seen as an outpatient by Dr. Beryle Beams and workup was started for questionable lymphoma.  She was also referred to Dr. Gwenette Greet for pulmonary evaluation.  A bone marrow biopsy was performed which was also nondiagnostic.  Bronchoscopy from Dr. Gwenette Greet was nondiagnostic.  She had previously been started on Cipro for fevers as well as a steroid taper but developed worsening dyspnea and malaise including a temperature up to 102 on the date of this admission.  After speaking with Dr. Annamaria Boots on the date of admission, she was subsequently directed to [facility] for  admission and further workup.  HOSPITAL COURSE:  The patient was admitted and further workup was begun. The patient was once again seen by Dr. Beryle Beams who felt at that time she would require an open lung biopsy for diagnosis of the pulmonary lesions.  Dr. Arlyce Dice saw the patient in consultation and agreed that she needs a left VATs with biopsies.  He explained all risks, benefits and alternatives of surgery to the patient and she agreed to proceed.  She was taken to the operating room on Jan 19, 2011, and underwent a left VATs with lung biopsies x2 by Dr. Arlyce Dice.  Please see previously dictated operative report for complete details of surgery.  She tolerated the procedure well and was transferred to the SICU in stable condition.  She was extubated on postop day #2 secondary to increased work of breathing on the vent.  She did require transfusion of packed red blood cells for hemoglobin of 6.9.  Her pulmonary status remained stable following extubation, although she continued to have O2 requirements of 2-4 liters supplemental oxygen.  She was treated with aggressive pulmonary toilet measures and diuresis.  She was transferred to the step-down unit on January 22, 2011.  Overall, she has progressed as expected postoperatively.  She is ambulating in the halls without difficulty and doing well with ambulation.  She  continues to desat with mobility and again requires 2-4 liters of supplemental oxygen.  She has been afebrile and vital signs have been stable.  Her blood pressures have been on the low side running 90s to below [id number] systolics and for this reason she has not been restarted on her home dose of lisinopril. She is tolerating a regular diet.  Her chest tubes have been removed in the standard fashion and her follow-up chest x-rays have been stable. Her labs on the day of discharge show a hemoglobin of 8.7, hematocrit 27.8, platelets 401, white count 7.1, sodium 135, potassium 3.5  which has been repleted, BUN 9, creatinine 0.47.  Her latest chest x-ray is stable with no pneumothorax.  Pathology from her surgery remains pending at the time of this dictation.  She has been seen and evaluated by Dr. Gwenette Greet and by Dr. Arlyce Dice on the morning of January 25, 2011, and is deemed ready for discharge home at this time.  DISCHARGE MEDICATIONS: 1. Ultram 50-100 mg q. 6 h. p.r.n. for pain. 2. Oxycodone 5 mg 2 tablets q. 6 h. p.r.n. for pain. 3. Amlodipine 5 mg daily. 4. Iron 325 mg b.i.d. 5. Nexium 40 mg daily p.r.n. 6. Tylenol Extra Strength 500 mg 2 tablets b.i.d. p.r.n.  DISCHARGE INSTRUCTIONS:  She was asked to refrain from driving, heavy lifting or strenuous activity.  She may continue ambulating daily and using her incentive spirometer.  She may shower daily and clean her incisions with soap and water.  She will continue the same preoperative diet.  DISCHARGE FOLLOWUP:  She will see Dr. Arlyce Dice in the office in 1 week with a chest x-ray.  She will need to follow up with Dr. Gwenette Greet in 1 week as well.  In the interim, if she experiences any problems or has questions, she is asked to contact our office immediately.     Suzzanne Cloud, P.A.   ______________________________ Nicanor Alcon, M.D.    GC/MEDQ  D:  01/25/2011  T:  01/26/2011  Job:  [id number]  cc:   Kathee Delton, MD,FCCP Annia Belt, M.D., F.A.C.P. Darrick Penna. Linna Darner, MD,FACP,FCCP Hosie Spangle, M.D. [facility] Office  Electronically Signed by Suzzanne Cloud P.A. on 02/16/2011 12:44:09 PM Electronically Signed by Ethelda Chick M.D. on 02/20/2011 04:05:22 PM

## 2011-02-21 NOTE — H&P (Signed)
NAME:  [name], [name]              ACCOUNT NO.:  [id number]  MEDICAL RECORD NO.:  [id number]          PATIENT TYPE:  LOCATION:                                 FACILITY:  PHYSICIAN:  Deeann Servidio D. Annamaria Boots, MD, FCCP, FACPDATE OF BIRTH:  06[birth year]1952  DATE OF ADMISSION:  01/14/2011 DATE OF DISCHARGE:                             HISTORY & PHYSICAL   ADMISSION DIAGNOSES: 1. Acute respiratory failure. 2. Diffuse pulmonary infiltrates. 3. Lung nodules. 4. Pain left foot.  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 She had been in good health denying major chronic illnesses.  Late last summer, she was bitten on the left leg by a cat and was febrile. Subsequently later in the fall, she fell with a penetrating wound into her distal left lower leg and again was febrile.  I believe with these illnesses she received doxycycline and sulfa.  In January 2012, she presented with a febrile illness and was seen by Dr. Allyn Kenner for Dermatology with diagnosis of erythema nodosum treated topically.  In retrospect, she does not think she has really felt quite well since the fall of 2011 with vague malaise and may be low grade fevers.  In March 2012, she developed low back pain, was seen initially by Orthopedics and subsequently by Dr. Jovita Gamma for Neurosurgery.  CT and MRI of the spine revealed a mass involving the T11-T12 foramen concerning for tumor.  Incidentally also noted extensive degenerative disk disease. Subsequent workup demonstrated multiple bilateral lung nodules.  She was seen by Dr. Beryle Beams and referred to Dr. Danton Sewer for pulmonary evaluation of the nodules.  PET scan showed that these lung nodules as well as the spine lesion were hypermetabolic concerning for cancer. Needle biopsy of the spine mass was nondiagnostic.  Bone marrow examination from the left hip was nondiagnostic  but did not show evidence of malignancy or vasculitis.  Bronchoscopy by Dr. Gwenette Greet was nondiagnostic.  Lavage associated with that procedure is negative to date for organisms on smear or culture testing for routine acid-fast and fungal organisms.  In the past week, she was begun on Cipro for fever. A followup CT scan with contrast done May 25 demonstrated diffuse parenchymal airspace disease- edema, or infection.  This is dense enough that it obscures the previously visible nodules.  She has been on Decadron 12 mg daily for the past month, weaned off with last dose 1 day prior to this admission.  She has been more febrile for the past week reporting temperature of 102 today and sweats without chills.  Because of the diffuse parenchymal infiltrates, she was prescribed Lasix but has taken only a single dose yesterday, none today.  She called today reporting increasing dyspnea, reaching Dr. Gwenette Greet who called me to admit her.  REVIEW OF SYSTEMS:  Some frontal headache with no stiff neck or confusion, change in vision, or lateralizing complaints.  There may have been a little left leg weakness dating back to Dr. Donnella Bi examination.  She is left-handed.  Dry cough or minimal scant white sputum.  No  further rash, adenopathy, or bleeding.  She has gained weight on Decadron and feels that she is retaining some fluid.  She denies chest pain but is progressively dyspneic.  Denies palpitations. Denies change in bowel or bladder, nausea or vomiting.  Denies abdominal pain or diarrhea.  Extremities:  Left lower leg has felt a little swollen, and she has an uncomfortable fullness or tightness in the left foot especially with weightbearing.  This apparently is not joint pain.  PAST MEDICAL HISTORY:  Negative for diabetes, heart, or lung disease. Obstructive sleep apnea previously evaluated by Dr. Gwenette Greet and treated with CPAP 12 CWP.  FAMILY HISTORY:  Parents have expired.   Noncontributory.  SOCIAL HISTORY:  Never smoked.  Occasional social alcohol.  Working as a Marine scientist in the pediatric unit.  OBJECTIVE:  VITAL SIGNS:  BP 114/68; pulse 94 and regular; respirations 20 to 22; temperature 99.3; oxygen saturation on arrival on room air was 61%, subsequently 100% on nonrebreathing mask with 15 L of oxygen flow which relieved dyspnea. GENERAL APPEARANCE:  Overweight, pleasant woman, cooperative. NEUROLOGIC:  Fully oriented, moving all extremities.  Cranial nerves grossly intact.  Gross vision and hearing intact. HEENT:  Conjunctivae are clear.  Oral mucosa is clear and normally hydrated.  She has her own teeth.  There is no stridor. NECK:  Full.  There may be 1 cm neck vein distention but it is difficult to evaluate.  Neck is flexible.  Thyroid does not feel enlarged. SKIN:  Some changes in moles but no acute rash. ADENOPATHY:  None found at the neck, supraclavicular, or axillary areas. CHEST:  Very minimal crackles in the lung bases.  No cough, rhonchi, or wheeze.  No dullness or rub. HEART:  Regular rhythm with no murmur or gallop. ABDOMEN:  Softly obese, nontender.  Bowel sounds are present.  I cannot feel enlargement of liver or spleen. BREASTS:  Pelvic and rectal are not examined at this time, noncontributory. EXTREMITIES:  Trace edema left lower leg and foot.  Negative Homan's bilaterally without calf tenderness.  There is no crepitus.  Joints are not hot.  There is no cyanosis, clubbing, or edema.  LABORATORY DATA:  As described above.  Additional lab includes CT of the sinuses normal on August 26, 2010.  Chest x-ray as of December 13, 2010, had shown possible improvement of a right perihilar nodule.  No change in nodule over the thoracic spine, no pleural effusion.  Moderate hiatal hernia was noted.  Bone marrow biopsy on Jan 04, 2011, has been negative looking for lymphoma.  IMPRESSION: 1. Acute respiratory failure with hypoxemia associated with  diffuse     pulmonary infiltrates which are new.  Concern discussed with Dr.     Gwenette Greet was that this might represent either pulmonary hemorrhage     for which there is no evidence with no blood at all so far in     sputum and no other bleeding demonstrated, or possibly Pneumocystis     or other infection superimposed on recent sustained Decadron     therapy.  For this problem, we will provide oxygen and update chest     x-ray.  She stopped Decadron as of yesterday and she is too hypoxic     at this time for safe bronchoscopy without intubation.  I will try     to induce sputum for Pneumocystis but consider presumptive therapy     with Bactrim. 2. Lung and spine nodules which have been associated with  nondiagnostic biopsy attempts.  If we identify a target, we may     need to offer open biopsy with particular consideration of a VATS     thoracotomy sampling both for the lung parenchymal infiltrate and     nodule.  I would be particularly concerned about the possibility of     a vasculitis such as Wegener's and will order repeat ANCA and     sedimentation rate.  I will not restart steroids at this point but     consider the possibility that the acute flare might have resulted     from withdrawal of steroids.  The history of cat bite with fever     raises the possibility of associated illness going back that far     such as toxoplasmosis and I will order serology.  This will be an     unusual demographic for sarcoid but some aspects might fit and we     might be able to get valid ACE level now that she is off of     steroids.  The immediate concern     is stabilization.  I am ordering broad cultures looking for     evidence of bacterial infection, but I do not have a reasonable     target for empiric antibiotics immediately.  She is being admitted     to the service of Dr. Danton Sewer.     Evella Kasal D. Annamaria Boots, MD, [facility], FACP     CDY/MEDQ  D:  01/14/2011  T:  01/15/2011  Job:   [id number]  Electronically Signed by Baird Lyons MD FCCP FACP on 02/21/2011 01:03:16 PM

## 2011-03-02 ENCOUNTER — Inpatient Hospital Stay (HOSPITAL_COMMUNITY)
Admission: RE | Admit: 2011-03-02 | Discharge: 2011-03-15 | DRG: 945 | Disposition: A | Discharge status: Home / Self Care | Payer: 59 | Source: Ambulatory Visit | Attending: Physical Medicine & Rehabilitation | Admitting: Physical Medicine & Rehabilitation

## 2011-03-02 DIAGNOSIS — M549 Dorsalgia, unspecified: Secondary | ICD-10-CM | POA: Diagnosis present

## 2011-03-02 DIAGNOSIS — G589 Mononeuropathy, unspecified: Secondary | ICD-10-CM | POA: Diagnosis present

## 2011-03-02 DIAGNOSIS — F411 Generalized anxiety disorder: Secondary | ICD-10-CM | POA: Diagnosis present

## 2011-03-02 DIAGNOSIS — M313 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: Secondary | ICD-10-CM | POA: Diagnosis present

## 2011-03-02 DIAGNOSIS — E876 Hypokalemia: Secondary | ICD-10-CM | POA: Diagnosis present

## 2011-03-02 DIAGNOSIS — A0472 Enterocolitis due to Clostridium difficile, not specified as recurrent: Secondary | ICD-10-CM | POA: Diagnosis present

## 2011-03-02 DIAGNOSIS — R209 Unspecified disturbances of skin sensation: Secondary | ICD-10-CM | POA: Diagnosis present

## 2011-03-02 DIAGNOSIS — D62 Acute posthemorrhagic anemia: Secondary | ICD-10-CM | POA: Diagnosis present

## 2011-03-02 DIAGNOSIS — R7309 Other abnormal glucose: Secondary | ICD-10-CM | POA: Diagnosis present

## 2011-03-02 DIAGNOSIS — Z5189 Encounter for other specified aftercare: Principal | ICD-10-CM

## 2011-03-02 DIAGNOSIS — T380X5A Adverse effect of glucocorticoids and synthetic analogues, initial encounter: Secondary | ICD-10-CM | POA: Diagnosis present

## 2011-03-02 DIAGNOSIS — G587 Mononeuritis multiplex: Secondary | ICD-10-CM

## 2011-03-02 DIAGNOSIS — R5381 Other malaise: Secondary | ICD-10-CM

## 2011-03-02 DIAGNOSIS — D72829 Elevated white blood cell count, unspecified: Secondary | ICD-10-CM | POA: Diagnosis present

## 2011-03-02 DIAGNOSIS — I776 Arteritis, unspecified: Secondary | ICD-10-CM | POA: Diagnosis present

## 2011-03-02 DIAGNOSIS — E559 Vitamin D deficiency, unspecified: Secondary | ICD-10-CM | POA: Diagnosis present

## 2011-03-02 DIAGNOSIS — K5730 Diverticulosis of large intestine without perforation or abscess without bleeding: Secondary | ICD-10-CM | POA: Diagnosis present

## 2011-03-02 DIAGNOSIS — B370 Candidal stomatitis: Secondary | ICD-10-CM | POA: Diagnosis present

## 2011-03-02 DIAGNOSIS — R609 Edema, unspecified: Secondary | ICD-10-CM | POA: Diagnosis present

## 2011-03-02 LAB — GLUCOSE, CAPILLARY: Glucose-Capillary: 155 mg/dL — ABNORMAL HIGH (ref 70–99)

## 2011-03-02 LAB — MRSA PCR SCREENING: MRSA by PCR: NEGATIVE

## 2011-03-03 ENCOUNTER — Inpatient Hospital Stay (HOSPITAL_COMMUNITY): Payer: 59

## 2011-03-03 LAB — GLUCOSE, CAPILLARY
Glucose-Capillary: 90 mg/dL (ref 70–99)
Glucose-Capillary: 148 mg/dL — ABNORMAL HIGH (ref 70–99)
Glucose-Capillary: 178 mg/dL — ABNORMAL HIGH (ref 70–99)
Glucose-Capillary: 141 mg/dL — ABNORMAL HIGH (ref 70–99)

## 2011-03-03 LAB — COMPREHENSIVE METABOLIC PANEL
ALT: 9 U/L (ref 0–35)
AST: 11 U/L (ref 0–37)
Albumin: 2 g/dL — ABNORMAL LOW (ref 3.5–5.2)
Alkaline Phosphatase: 48 U/L (ref 39–117)
BUN: 14 mg/dL (ref 6–23)
CO2: 29 mEq/L (ref 19–32)
Calcium: 8.5 mg/dL (ref 8.4–10.5)
Chloride: 106 mEq/L (ref 96–112)
Creatinine, Ser: 0.55 mg/dL (ref 0.50–1.10)
GFR calc Af Amer: >60 mL/min (ref >60)
GFR calc non Af Amer: >60 mL/min (ref >60)
Glucose, Bld: 84 mg/dL (ref 70–99)
Potassium: 3.1 mEq/L — ABNORMAL LOW (ref 3.5–5.1)
Sodium: 141 mEq/L (ref 135–145)
Total Bilirubin: 0.3 mg/dL (ref 0.3–1.2)
Total Protein: 4.8 g/dL — ABNORMAL LOW (ref 6.0–8.3)

## 2011-03-03 LAB — HEMOGLOBIN A1C
Hgb A1c MFr Bld: 5.8 % — ABNORMAL HIGH (ref <5.7)
Mean Plasma Glucose: 120 mg/dL — ABNORMAL HIGH (ref <117)

## 2011-03-03 LAB — CBC
HCT: 25.5 % — ABNORMAL LOW (ref 36.0–46.0)
Hemoglobin: 8.1 g/dL — ABNORMAL LOW (ref 12.0–15.0)
MCH: 28.3 pg (ref 26.0–34.0)
MCHC: 31.8 g/dL (ref 30.0–36.0)
MCV: 89.2 fL (ref 78.0–100.0)
Platelets: 468 10*3/uL — ABNORMAL HIGH (ref 150–400)
RBC: 2.86 MIL/uL — ABNORMAL LOW (ref 3.87–5.11)
RDW: 22.8 % — ABNORMAL HIGH (ref 11.5–15.5)
WBC: 12.4 10*3/uL — ABNORMAL HIGH (ref 4.0–10.5)

## 2011-03-03 LAB — AFB CULTURE WITH SMEAR (NOT AT ARMC): Acid Fast Smear: NONE SEEN

## 2011-03-03 LAB — DIFFERENTIAL
Basophils Absolute: 0 10*3/uL (ref 0.0–0.1)
Basophils Relative: 0 % (ref 0–1)
Eosinophils Absolute: 0 10*3/uL (ref 0.0–0.7)
Eosinophils Relative: 0 % (ref 0–5)
Lymphocytes Relative: 10 % — ABNORMAL LOW (ref 12–46)
Lymphs Abs: 1.2 10*3/uL (ref 0.7–4.0)
Monocytes Absolute: 1.1 10*3/uL — ABNORMAL HIGH (ref 0.1–1.0)
Monocytes Relative: 9 % (ref 3–12)
Neutro Abs: 10.1 10*3/uL — ABNORMAL HIGH (ref 1.7–7.7)
Neutrophils Relative %: 81 % — ABNORMAL HIGH (ref 43–77)

## 2011-03-03 LAB — CLOSTRIDIUM DIFFICILE BY PCR: Toxigenic C. Difficile by PCR: NEGATIVE

## 2011-03-04 DIAGNOSIS — G587 Mononeuritis multiplex: Secondary | ICD-10-CM

## 2011-03-04 DIAGNOSIS — R5381 Other malaise: Secondary | ICD-10-CM

## 2011-03-04 DIAGNOSIS — M313 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: Secondary | ICD-10-CM

## 2011-03-04 LAB — GLUCOSE, CAPILLARY
Glucose-Capillary: 81 mg/dL (ref 70–99)
Glucose-Capillary: 150 mg/dL — ABNORMAL HIGH (ref 70–99)
Glucose-Capillary: 168 mg/dL — ABNORMAL HIGH (ref 70–99)
Glucose-Capillary: 156 mg/dL — ABNORMAL HIGH (ref 70–99)

## 2011-03-05 LAB — GLUCOSE, CAPILLARY
Glucose-Capillary: 87 mg/dL (ref 70–99)
Glucose-Capillary: 144 mg/dL — ABNORMAL HIGH (ref 70–99)
Glucose-Capillary: 204 mg/dL — ABNORMAL HIGH (ref 70–99)
Glucose-Capillary: 106 mg/dL — ABNORMAL HIGH (ref 70–99)

## 2011-03-06 LAB — CLOSTRIDIUM DIFFICILE BY PCR: Toxigenic C. Difficile by PCR: NEGATIVE

## 2011-03-06 LAB — GLUCOSE, CAPILLARY
Glucose-Capillary: 85 mg/dL (ref 70–99)
Glucose-Capillary: 147 mg/dL — ABNORMAL HIGH (ref 70–99)
Glucose-Capillary: 152 mg/dL — ABNORMAL HIGH (ref 70–99)
Glucose-Capillary: 120 mg/dL — ABNORMAL HIGH (ref 70–99)

## 2011-03-06 LAB — AFB CULTURE WITH SMEAR (NOT AT ARMC): Acid Fast Smear: NONE SEEN

## 2011-03-07 LAB — GLUCOSE, CAPILLARY
Glucose-Capillary: 79 mg/dL (ref 70–99)
Glucose-Capillary: 153 mg/dL — ABNORMAL HIGH (ref 70–99)
Glucose-Capillary: 158 mg/dL — ABNORMAL HIGH (ref 70–99)
Glucose-Capillary: 138 mg/dL — ABNORMAL HIGH (ref 70–99)

## 2011-03-07 LAB — URINALYSIS, MICROSCOPIC ONLY
Bilirubin Urine: NEGATIVE
Glucose, UA: NEGATIVE mg/dL
Ketones, ur: NEGATIVE mg/dL
Nitrite: NEGATIVE
Protein, ur: 100 mg/dL — AB
Specific Gravity, Urine: 1.016 (ref 1.005–1.030)
Urobilinogen, UA: 0.2 mg/dL (ref 0.0–1.0)
pH: 5.5 (ref 5.0–8.0)

## 2011-03-08 LAB — BASIC METABOLIC PANEL
BUN: 20 mg/dL (ref 6–23)
CO2: 28 mEq/L (ref 19–32)
Calcium: 9.1 mg/dL (ref 8.4–10.5)
Chloride: 102 mEq/L (ref 96–112)
Creatinine, Ser: 0.63 mg/dL (ref 0.50–1.10)
GFR calc Af Amer: >60 mL/min (ref >60)
GFR calc non Af Amer: >60 mL/min (ref >60)
Glucose, Bld: 84 mg/dL (ref 70–99)
Potassium: 3.5 mEq/L (ref 3.5–5.1)
Sodium: 138 mEq/L (ref 135–145)

## 2011-03-08 LAB — CBC
HCT: 30.1 % — ABNORMAL LOW (ref 36.0–46.0)
Hemoglobin: 9.7 g/dL — ABNORMAL LOW (ref 12.0–15.0)
MCH: 29.1 pg (ref 26.0–34.0)
MCHC: 32.2 g/dL (ref 30.0–36.0)
MCV: 90.4 fL (ref 78.0–100.0)
Platelets: 509 10*3/uL — ABNORMAL HIGH (ref 150–400)
RBC: 3.33 MIL/uL — ABNORMAL LOW (ref 3.87–5.11)
RDW: 23 % — ABNORMAL HIGH (ref 11.5–15.5)
WBC: 10.2 10*3/uL (ref 4.0–10.5)

## 2011-03-08 LAB — URINE CULTURE
Colony Count: 15000
Culture  Setup Time: 201207180151

## 2011-03-08 LAB — GLUCOSE, CAPILLARY
Glucose-Capillary: 85 mg/dL (ref 70–99)
Glucose-Capillary: 123 mg/dL — ABNORMAL HIGH (ref 70–99)
Glucose-Capillary: 176 mg/dL — ABNORMAL HIGH (ref 70–99)
Glucose-Capillary: 137 mg/dL — ABNORMAL HIGH (ref 70–99)

## 2011-03-09 LAB — GLUCOSE, CAPILLARY
Glucose-Capillary: 87 mg/dL (ref 70–99)
Glucose-Capillary: 132 mg/dL — ABNORMAL HIGH (ref 70–99)
Glucose-Capillary: 159 mg/dL — ABNORMAL HIGH (ref 70–99)
Glucose-Capillary: 123 mg/dL — ABNORMAL HIGH (ref 70–99)

## 2011-03-10 LAB — GLUCOSE, CAPILLARY
Glucose-Capillary: 81 mg/dL (ref 70–99)
Glucose-Capillary: 154 mg/dL — ABNORMAL HIGH (ref 70–99)
Glucose-Capillary: 198 mg/dL — ABNORMAL HIGH (ref 70–99)
Glucose-Capillary: 111 mg/dL — ABNORMAL HIGH (ref 70–99)

## 2011-03-11 LAB — GLUCOSE, CAPILLARY
Glucose-Capillary: 87 mg/dL (ref 70–99)
Glucose-Capillary: 130 mg/dL — ABNORMAL HIGH (ref 70–99)
Glucose-Capillary: 186 mg/dL — ABNORMAL HIGH (ref 70–99)
Glucose-Capillary: 153 mg/dL — ABNORMAL HIGH (ref 70–99)

## 2011-03-12 LAB — GLUCOSE, CAPILLARY
Glucose-Capillary: 92 mg/dL (ref 70–99)
Glucose-Capillary: 140 mg/dL — ABNORMAL HIGH (ref 70–99)
Glucose-Capillary: 162 mg/dL — ABNORMAL HIGH (ref 70–99)
Glucose-Capillary: 84 mg/dL (ref 70–99)

## 2011-03-13 LAB — GLUCOSE, CAPILLARY
Glucose-Capillary: 85 mg/dL (ref 70–99)
Glucose-Capillary: 152 mg/dL — ABNORMAL HIGH (ref 70–99)
Glucose-Capillary: 160 mg/dL — ABNORMAL HIGH (ref 70–99)
Glucose-Capillary: 95 mg/dL (ref 70–99)

## 2011-03-14 LAB — GLUCOSE, CAPILLARY
Glucose-Capillary: 82 mg/dL (ref 70–99)
Glucose-Capillary: 152 mg/dL — ABNORMAL HIGH (ref 70–99)
Glucose-Capillary: 162 mg/dL — ABNORMAL HIGH (ref 70–99)
Glucose-Capillary: 98 mg/dL (ref 70–99)

## 2011-03-15 LAB — GLUCOSE, CAPILLARY
Glucose-Capillary: 91 mg/dL (ref 70–99)
Glucose-Capillary: 131 mg/dL — ABNORMAL HIGH (ref 70–99)

## 2011-03-17 NOTE — H&P (Signed)
NAMEELLANI, BILBRO NO.:  [id number]  MEDICAL RECORD NO.:  [id number]  LOCATION:  [id number]                         FACILITY:  [facility]  PHYSICIAN:  Charlett Blake, M.D.DATE OF BIRTH:  03-Aug-1951  DATE OF ADMISSION:  03/02/2011 DATE OF DISCHARGE:                             HISTORY & PHYSICAL   REASON FOR ADMISSION:  Problems with ambulation, bilateral foot pain.  This is a 60 year old female with history of back pain, paraspinal mass, biopsy was inconclusive.  VATS of her lung mass was also inconclusive, bone marrow was negative for lymphoma or leukemia.  The patient had positive antinuclear cytoplasmic antibody and positive anti-PR3 ANCA. Two weeks prior to admission, she was transported at [facility] for second opinion on her symptomatology.  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  CT chest done secondary to increasing shortness of breath revealing a right middle lobe and upper lobe pulmonary embolism and bilateral lower extremities demonstrated positive DVTs.  She was started on Lovenox and developed spontaneous splenic hemorrhage requiring splenic embolization and placement of the IVC filter.  She had an E. coli UTI, treated with Cipro, did develop C diff colitis, being treated with Flagyl through March 06, 2011.  Hospital course was complicated by respiratory failure, did have therapeutic thoracentesis.  Workup by Rheumatology, Dermatology, and Pulmonology concluded Wegener granulomatosis with vasculitis.  She had issues with steroid-induced hyperglycemia, hearing loss in the right ear, chronic tinnitus.  Audiology consult showed right sensorineural hearing loss with followup as an outpatient recommended. Elevated blood pressure was secondary to steroid use and anxiety.  She had continued back pain with activity.  MRI of the lumbar spine showed degenerative disk disease, multilevel neural foraminal  stenosis, no masses.  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  Lovenox was resumed with recommendation for 6- month treatment.  She has problems with activity tolerance.  REVIEW OF SYSTEMS:  Positive for back pain, right greater than left foot pain, numbness and burning pain, weakness all over as well as anxiety.  PAST MEDICAL HISTORY: 1. Hypertension. 2. GERD. 3. Osteoarthritis. 4. Question OSA. 5. Peripheral edema. 6. Diverticulosis. 7. Degenerative disk disease. 8. Anemia. 9. Pulmonary nodules.  FAMILY HISTORY:  Lung carcinoma, TIAs.  SOCIAL HISTORY:  Lives alone, works as a Press photographer at [employer].  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  HOME MEDICATIONS:  Norvasc, lisinopril, oxycodone, and Ultram.  Her last hemoglobin was 9.5 on February 28, 2011, white count 16.5, platelets 480,000.  Sodium 140, potassium 3.3, BUN 16, creatinine 0.5.  Repeat K on February 28, 2011, was 3.7.  PHYSICAL EXAMINATION:  VITAL SIGNS:  Blood pressure 122/80, pulse 104, respirations 20, temperature 99.3, O2 sats 90% on room air. GENERAL:  An anxious, obese female, in no acute distress. HEENT:  Eyes anicteric, noninjected.  External ENT normal except for some whitish coating on tongue and posterior pharynx. NECK:  Supple without adenopathy. CHEST:  Respiratory effort is good.  Lungs are clear. HEART:  Tachycardic but regular.  She has decreased pedal pulses bilaterally, partially difficult to assess due to 2+  edema, bilateral pedal. ABDOMEN:  Positive bowel sounds, soft, nontender to palpation. SKIN:  Splinter-type hemorrhage around the toes.  She has multiple areas of petechiae and ecchymosis in the upper extremity.  Abrasion in the right mid thigh.  An eschar at prior chest tube site in left lateral chest wall. PSYCH:  Mood, memory, orientation are all normal.  Mood and affect show mild anxiety. NEUROLOGIC:  Sensation  is reduced particularly in the right plantar surface of the foot.  Deep tendon reflexes are reduced in bilateral ankle and knees.  Motor strength is 4- in the hip flexor, knee extensor, ankle dorsiflexor, 5- in the deltoid, biceps, triceps grip.  Mildly decreased ankle range of motion bilaterally.  POST ADMISSION PHYSICIAN EVALUATION: 1. Functional deficits secondary to vasculitic neuropathy and     deconditioning related to Wegener granulomatosis. 2. The patient is admitted to receive collaborative interdisciplinary     care between the physiatrist and rehab nursing staff and therapy     team. 3. The patient's level of medical complexity and substantial therapy     needs in context of the medical necessity cannot be provided at a     lesser intensity of care. 4. The patient has experienced potential functional loss from her     baseline.  Upon functional assessment at the time of preadmission     screening, the patient was at a mod assist level bed mobility, mod     assist transfers, and min assist 50 feet x2 with rolling walker,     supervision upper body, min assist lower body supervision and     grooming, mod assist toilet transfers.  Upon functional assessment     today, mod assist transfers, min assist 50 feet with rest breaks,     heart rates up to 130s.  OT supervision cues upper body care, mod     and min assist lower body care.  Judging by the patient's     diagnosis, physical exam, and functional history, the patient has     demonstrated ability to make functional progress which will result     in measurable gains while on inpatient rehab.  These gains will be     of substantial and practical use upon discharge to home in     facilitating mobility, self-care, and independence.  Interim change     in medical status since preadmission screening are detailed in the     history present illness. 5. Physiatrist will provide 24-hour management and medical needs as     well as  oversight of the therapy plan/treatment and provide     guidance as appropriate regarding interaction of the two. 6. A 24-hour rehab nursing will assist in management of skin bowel,     bladder, and help integrate therapy concepts, techniques,     education. 7. PT will assess and treat for pre-gait training and gait training,     endurance, safety, equipment.  Goals are for a modified independent     level with her mobility. 8. OT will assess and treat for ADLs, safety, endurance, equipment.     Goals are for modified independent level with ADLs. 9. Case management and social work will assess and treat for     psychosocial issues, discharge planning. 10.Team conference will be held weekly to assess the patient's     progress/goals and determine barriers to discharge. 11.The patient has demonstrated sufficient medical stability and     exercise capacity to tolerate at least  3 hours of therapy per day     at least 5 days per week. 12.Estimated length of stay is 7-10 days.  Prognosis for further     functional improvement is good.  MEDICAL PROBLEM LIST AND PLAN: 1. Deep venous thrombosis prophylaxis.  Continue subcu Lovenox.     Already has IVC filter. 2. Pain management.  For neuropathy, we will use Neurontin, oxycodone     for back pain, although we will try to wean down to hydrocodone or     tramadol. 3. Acute blood loss anemia secondary to splenic hemorrhage.  Monitor     CBC. 4. Wegener granulomatosis, on Rituxan and steroid. 5. Vitamin D deficiency.  Supplement. 6. Hypertension.  Continue Norvasc and lisinopril. 7. Steroid-induced hyperglycemia.  Check CBC.  SSI for elevated blood     sugars. 8. Thrush.  Treat with Diflucan.  Nystatin mouthwash. 9. Leukocytosis, steroid induced. 10.Hypokalemia, due to steroid. 11.Clostridium difficile colitis.  Finishing up Flagyl on March 06, 2015. 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  I discussed rehab medicine services with the patient, she states that she is highly motivated.  Her mood is adequate for full participation and questions answered.     Charlett Blake, M.D.     AEK/MEDQ  D:  03/02/2011  T:  03/03/2011  Job:  [id number]  cc:   Darrick Penna. Linna Darner, MD,FACP,FCCP Kathee Delton, MD,FCCP Annia Belt, M.D., F.A.C.P. Hosie Spangle, M.D. Nicanor Alcon, M.D. Dr. Eligha Bridegroom at [facility] Dr. Ginger Organ [place]  Electronically Signed by Alysia Penna M.D. on 03/17/2011 11:21:53 AM

## 2011-03-27 ENCOUNTER — Other Ambulatory Visit: Payer: Self-pay | Admitting: Thoracic Surgery

## 2011-03-27 DIAGNOSIS — D381 Neoplasm of uncertain behavior of trachea, bronchus and lung: Secondary | ICD-10-CM

## 2011-03-28 ENCOUNTER — Ambulatory Visit
Admission: RE | Admit: 2011-03-28 | Discharge: 2011-03-28 | Disposition: A | Discharge status: Home / Self Care | Payer: 59 | Source: Ambulatory Visit | Attending: Thoracic Surgery | Admitting: Thoracic Surgery

## 2011-03-28 ENCOUNTER — Ambulatory Visit (INDEPENDENT_AMBULATORY_CARE_PROVIDER_SITE_OTHER): Payer: Self-pay | Admitting: Thoracic Surgery

## 2011-03-28 DIAGNOSIS — J8410 Pulmonary fibrosis, unspecified: Secondary | ICD-10-CM

## 2011-03-28 DIAGNOSIS — D381 Neoplasm of uncertain behavior of trachea, bronchus and lung: Secondary | ICD-10-CM

## 2011-03-29 NOTE — Letter (Signed)
March 28, 2011  Kathee Delton, MD,FCCP [address], [place] [address]  Re:  CAMERON, FREDRICK                DOB:  [birth year]  Dear Dr. Gwenette Greet:  The patient comes today and she is doing well.  From chest standpoint her x-ray shows resolution of her edema.  Her chest tube incisions are well healed.  She is going back to [facility] tomorrow for followup.  She does have a lot of ecchymosis on her skin but otherwise is doing remarkably well.  I will be happy to see her again if she has any future problems.  Nicanor Alcon, M.D. Electronically Signed  DPB/MEDQ  D:  03/28/2011  T:  03/29/2011  Job:  [id number]

## 2011-03-31 ENCOUNTER — Ambulatory Visit (INDEPENDENT_AMBULATORY_CARE_PROVIDER_SITE_OTHER): Payer: 59 | Admitting: Internal Medicine

## 2011-03-31 ENCOUNTER — Encounter: Payer: Self-pay | Admitting: Internal Medicine

## 2011-03-31 DIAGNOSIS — I82409 Acute embolism and thrombosis of unspecified deep veins of unspecified lower extremity: Secondary | ICD-10-CM

## 2011-03-31 DIAGNOSIS — I2699 Other pulmonary embolism without acute cor pulmonale: Secondary | ICD-10-CM

## 2011-03-31 DIAGNOSIS — M313 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: Secondary | ICD-10-CM

## 2011-03-31 LAB — CBC WITH DIFFERENTIAL/PLATELET
Basophils Absolute: 0 10*3/uL (ref 0.0–0.1)
Basophils Relative: 0 % (ref 0.0–3.0)
Eosinophils Absolute: 0 10*3/uL (ref 0.0–0.7)
Eosinophils Relative: 0 % (ref 0.0–5.0)
HCT: 34.6 % — ABNORMAL LOW (ref 36.0–46.0)
Hemoglobin: 11.4 g/dL — ABNORMAL LOW (ref 12.0–15.0)
Lymphocytes Relative: 4.2 % — ABNORMAL LOW (ref 12.0–46.0)
Lymphs Abs: 0.7 10*3/uL (ref 0.7–4.0)
MCHC: 32.8 g/dL (ref 30.0–36.0)
MCV: 93.9 fl (ref 78.0–100.0)
Monocytes Absolute: 0.1 10*3/uL (ref 0.1–1.0)
Monocytes Relative: 0.9 % — ABNORMAL LOW (ref 3.0–12.0)
Neutro Abs: 15.4 10*3/uL — ABNORMAL HIGH (ref 1.4–7.7)
Neutrophils Relative %: 94.9 % — ABNORMAL HIGH (ref 43.0–77.0)
Platelets: 586 10*3/uL — ABNORMAL HIGH (ref 150.0–400.0)
RBC: 3.69 Mil/uL — ABNORMAL LOW (ref 3.87–5.11)
RDW: 22.4 % — ABNORMAL HIGH (ref 11.5–14.6)
WBC: 16.2 10*3/uL — ABNORMAL HIGH (ref 4.5–10.5)

## 2011-03-31 LAB — BASIC METABOLIC PANEL
BUN: 29 mg/dL — ABNORMAL HIGH (ref 6–23)
CO2: 26 mEq/L (ref 19–32)
Calcium: 9.3 mg/dL (ref 8.4–10.5)
Chloride: 104 mEq/L (ref 96–112)
Creatinine, Ser: 0.8 mg/dL (ref 0.4–1.2)
GFR: 81.42 mL/min (ref >60.00)
Glucose, Bld: 147 mg/dL — ABNORMAL HIGH (ref 70–99)
Potassium: 4.4 mEq/L (ref 3.5–5.1)
Sodium: 142 mEq/L (ref 135–145)

## 2011-03-31 LAB — POCT INR: INR: 0.8

## 2011-03-31 MED ORDER — WARFARIN SODIUM 5 MG PO TABS: 5.0000 mg | ORAL_TABLET | Freq: Every day | ORAL | Status: DC

## 2011-03-31 NOTE — Patient Instructions (Signed)
Your PT/INR was 0.8; therapeutic value will be at least 2.0-3.5 max. Start Coumadin 5 mg daily and check a PT/INR in one week. The Lovenox will be continued until your PT/INR is therapeutic

## 2011-03-31 NOTE — Progress Notes (Signed)
  Subjective:    Patient ID: [name], female    DOB: June 11, 1951, 60 y.o.   MRN: [id number]  HPI She  returns after a prolonged hospitalization with a stormy hospital course. Her hospitalization was complicated by Clostridium difficile colitis, deep venous thrombosis and pulmonary embolism. She also has obstructive sleep apnea. Multi-systems process has been diagnosed as vasculitis; Dr. Vernon Prey believes that this represents a variant of Wegener's.He is recommending she be  switched from Lovenox to Coumadin. He has not changed her therapy otherwise except decrease in oral steoids  She is not having any active GI problems at this time. Her major problem is debilitation following this  prolonged multi-systems illness. She is receiving physical therapy twice a week and using a rolling walker for ambulation.      Review of Systems she also has hearing loss and tinnitus; this is felt to be related to vasculitis as well rather than to antibiotics or other treatments.     Objective:   Physical Exam on exam she's in no acute distress; she exhibits cushingoid changes expected from high dose steroids.  Chest is clear without rales, rhonchi, or increased work of breathing.  She has an S4 gallop with a flow murmur.  Abdomen is quiet but soft and nontender  She has significant ankle edema despite the supportive. This decreases the dorsalis pedis pulses.  She has diffuse ecchymoses over forearms.  She has no lymphadenopathy in the head, neck and legs        Assessment & Plan:  #1 vasculitis, Wegener's variant; as per Dr. Vernon Prey  #2 status post deep venous thrombosis and pulmonary emboli; presently on Lovenox  #3 debilitation from complicated,multi-systems severe disease processes.  Plan: PT/INR to guide transition to Coumadin therapy.

## 2011-04-05 NOTE — Discharge Summary (Signed)
NAMEKENNEDE, PROSEN NO.:  [id number]  MEDICAL RECORD NO.:  [id number]  LOCATION:  [id number]                         FACILITY:  [facility]  PHYSICIAN:  Charlett Blake, M.D.DATE OF BIRTH:  1951-08-11  DATE OF ADMISSION:  03/02/2011 DATE OF DISCHARGE:  03/15/2011                              DISCHARGE SUMMARY   DISCHARGE DIAGNOSES: 1. Mononeuritis multiplex with deconditioning. 2. Steroid-induced hyperglycemia. 3. Clostridium difficile colitis, treated. 4. Right sensorineural hearing loss. 5. Anemia, status post splenic hemorrhage. 6. History of deep venous thrombosis and pulmonary embolisms. 7. Presumed redness in the arms and vasculitis. 8. Obstructive sleep apnea.  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  She was also noted to have a lung mass in inconclusive biopsy and bone marrow negative for lymphoma and leukemia, noted to have positive c-ANCA and anti-PR3 3 weeks prior to admission to [facility] on February 07, 2011.  The patient with DOE and family transported her there for second opinion.  Skin biopsy done shows small and medial vessel vasculitis and the patient was started on Rituxan and high-dosed steroids.  CT of chest was done due to increasing shortness of breath and revealed right middle lobe and right upper lobe PE and bilateral lower extremity was positive for DVT.  The patient was started on Lovenox, but developed spontaneous splenic hemorrhage requiring splenic embolization and IVC filter was placed.  The patient also with E. coli UTI, treated with Cipro.  She did develop C. diff colitis and is currently being treated with Flagyl with recommendations to continue this through March 06, 2011.  Hospital course complicated by respiratory failure.  She did require a therapeutic thoracocentesis.  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  The patient has had issues with steroid-induced hyperglycemia, treated with sliding scale insulin.  She was also noted to have hearing loss in right ear with chronic tinnitus.  An Audiology consult, revealed the patient with right sensorineural hearing loss with recommendations for follow up on outpatient basis.  The patient did report issues with back pain with activity and MRI of L-spine showed DDD and multilevel neural foraminal stenosis.  No masses.  She is noted to have left greater than right foot pain due to vasculitis-induced neuropathy.  She is currently noted to be deconditioned with hypoxia with activity.  The patient was evaluated by Rehab team and we felt that she would benefit from a CIR program.  PAST MEDICAL HISTORY:  Positive for: 1. Hypertension. 2. GERD. 3. OA. 4. O2 dependence since June 2012. 5. Presumed granulomatosis. 6. Anemia. 7. Pulmonary nodules. 8. Obstructive sleep apnea. 9. Peripheral edema. 10.Diverticulosis. 11.DDD, L-spine with left lower extremity involvement.  ALLERGIES:  ADHESIVE, CODEINE and LASIX.  REVIEW OF SYMPTOMS:  Positive for back pain, right greater than left foot pain with numbness and burning sensation as well as weakness and anxiety issues.  FAMILY HISTORY:  Positive for lung cancer and TIA.  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  Does not use any tobacco or alcohol.  FUNCTIONAL HISTORY:  The patient was independent in working prior to March 2012.  FUNCTIONAL STATUS:  The patient is mod-assist for transfers, min-assist ambulating 50 feet x4 with rest breaks.  Noted to have tachycardia with activity.  She is supervision with cues for upper body care, min-to-mod- assist for lower body care.  HOSPITAL COURSE:  [name] was admitted to rehab on March 02, 2011, for inpatient therapies to consist of PT/OT at least 3 hours 5 days a week.  Past-admission, physiatrist, rehab RN and therapy team have worked together to provide customized collaborative interdisciplinary care.  Rehab RN has worked with the patient on bowel and bladder program as well as well wound care issues.  The patient initially required O2; however, she has been weaned off of this without any dyspnea reported.  Currently, O2 sats are ranging from 90-97% on room air.  Blood pressures have been checked on b.i.d. basis and these have been reasonably controlled ranging from [id number] systolic and [id number] diastolic.  Heart rate, the patient's tachycardia has improved greatly.  Routine labs were done past admission showing H and H at 8.1 and 25.2 with white count at 12.4.  Repeat labs of March 08, 2011 shows H and H to be improving at 9.7 and 30.1, leukocytosis resolving at 10.2, platelets at 509.  Check of lytes at admission revealed mild hypokalemia with potassium at 3.1.  This was supplemented.  Recheck of March 08, 2011, revealed sodium 138, potassium 3.5, chloride 102, CO2 28, BUN 20, creatinine 0.68, glucose 84.  C. diff checks were done past admission and these have been negative.  A UA/UC was done past admission and showed 15,000 colonies of multi-species.  The patient's has reported some issues with frequency and PVR checks showed low volume.  She was started on Ditropan to help with her symptomatology.  As symptoms improving, this was discontinued prior to discharge.  The patient has had issues with neuropathy, bilateral feet.  Her Neurontin dose was adjusted to 400 mg p.o. q.i.d. with better pain control.  Dr. Arlyce Dice has followed up on the patient's left chest tube wounds.  These are noted to be healing well, they cleaned and dried.  The patient is advised to cleanse this area with soap and water, pad dry and apply dry Band-Aid. The patient's steroid-induced hyperglycemia  has been monitored with a.c. and at bedtime CBG checks.  Dietitian was consulted to educate the patient on carb-modified low-salt diet.  Currently, blood sugars are ranging from 90s to 150s range.  The patient advised to check blood sugars on a.c. and at bedtime basis and modify her diet to help with hyperglycemia.  She is to follow up with her primary care physician in next couple of weeks to see if oral agent might be indicated if blood sugars were poorly controlled.  During the patient's stay in rehab, weekly team conferences were held to monitor the patient's progress, set goals as well as discuss barriers to discharge.  At the time of admission, the patient was noted to have decrease in functional mobility with bilateral lower extremity weakness, poor endurance, decreased speed as well as decrease standing balance. She was impaired in ability to carry out ADLs due to decrease in upper and lower extremity strength as well as decreased activity tolerance. She was at min-assist for upper body care, max-assist for lower body dressing, mod-assist for sit to stand from  lower surfaces.  She has made steady progress during her stay.  She is currently showing increase in upper and lower extremity strength with increased ability to complete lower body bathing and dressing tasks.  Her overall functional activity tolerance and endurance has improved.  The patient is currently at supervision level for shower transfers and showering.  She is able to perform all dressing tasks with supervision needed to don TEDs.  The patient is at distant supervision for toileting.  She is supervision for ambulating greater than 150 feet with rolling walker.  She requires min- assist with the rest breaks for navigating stairs.  Berg score has improved to 36 out of 56.  The patient has improved in her walk test to 2 minutes indicating improvement in endurance.  She does continue to have increased difficulty with  sit to stand transfers from lower surfaces.  Further follow up home health, PT, OT to continue past discharge.  Supervision will be provided by hired help.  On March 15, 2011, the patient is discharged to home.  DISCHARGE MEDICATIONS: 1. Vitamin D2 50,000 units p.o. every Wednesday. 2. Lovenox 60 mg subcu b.i.d. 3. Ferrous sulfate 325 mg t.i.d. 4. Gabapentin 400 mg p.o. q.i.d. 5. Lisinopril 10 mg p.o. per day. 6. Nystatin suspension 10 mL p.o. q.i.d. swish and swallow. 7. OxyIR 5 mg one to two p.o. q.6 h. p.r.n. moderate-to-severe pain,     #90 Rx. 8. Flexeril 5 mg one to two p.o. b.i.d. as needed for spasms. 9. Prednisone 60 mg p.o. per day. 10.Septra DS one p.o. on Monday, Wednesday, Friday. 11.Ultram 50 mg p.o. q.6 h. p.r.n. mild-to-moderate pain. 12.Norvasc 10 mg p.o. per day. 13.Multivitamin one per day. 14.Nexium 40 mg p.o. per day. 15.Tylenol 500 mg two p.o. b.i.d. p.r.n. pain.  Diet is low salt, carb modified.  Activity level is at supervision.  No strenuous activity.  SPECIAL INSTRUCTIONS:  Walk with use of walker.  No driving.  Use CPAP at bedtime.  Keep Mepilex dressing on left forearm and chest wall for 4- 5 days, then just apply dry Band-Aid.  [facility] to provide PT, OT, and RN.  FOLLOWUP:  The patient to follow up with Dr. Letta Pate on April 13, 2011 at 10 a.m. for 10:30 appointment and EMG testing.  Follow up with Dr. Vernon Prey in the next 10 days.  Follow up with Dr. Linna Darner in the next 10 days.  Follow up with Dr. Beryle Beams for routine check.  Follow up with Dr. Arlyce Dice with a chest x-ray prior to office visit.     Reesa Chew, P.A.   ______________________________ Charlett Blake, M.D.    PL/MEDQ  D:  03/15/2011  T:  03/16/2011  Job:  [id number]  cc:   Darrick Penna. Linna Darner, MD,FACP,FCCP Dr. Ginger Organ Annia Belt, M.D., F.A.C.P. Nicanor Alcon, M.D.  Electronically Signed by Joline Maxcy. on 03/17/2011 02:01:08 PM Electronically  Signed by Alysia Penna M.D. on 04/05/2011 09:44:42 AM

## 2011-04-06 LAB — HEMOGLOBIN A1C: Hgb A1c MFr Bld: 5.6 % (ref 4.6–6.5)

## 2011-04-07 ENCOUNTER — Ambulatory Visit (INDEPENDENT_AMBULATORY_CARE_PROVIDER_SITE_OTHER): Payer: 59 | Admitting: *Deleted

## 2011-04-07 ENCOUNTER — Ambulatory Visit: Payer: Self-pay

## 2011-04-07 DIAGNOSIS — I2699 Other pulmonary embolism without acute cor pulmonale: Secondary | ICD-10-CM

## 2011-04-07 DIAGNOSIS — Z7901 Long term (current) use of anticoagulants: Secondary | ICD-10-CM

## 2011-04-07 DIAGNOSIS — I82409 Acute embolism and thrombosis of unspecified deep veins of unspecified lower extremity: Secondary | ICD-10-CM

## 2011-04-07 LAB — POCT INR: INR: 1.6

## 2011-04-07 NOTE — Patient Instructions (Addendum)
Per Dr. Linna Darner pt instructed 5mg  daily except 7.5mg  M,W,F along w/ lovenox recheck 1 week

## 2011-04-11 ENCOUNTER — Encounter: Payer: 59 | Attending: Physical Medicine & Rehabilitation

## 2011-04-11 ENCOUNTER — Inpatient Hospital Stay (HOSPITAL_BASED_OUTPATIENT_CLINIC_OR_DEPARTMENT_OTHER): Payer: 59 | Admitting: Physical Medicine & Rehabilitation

## 2011-04-11 DIAGNOSIS — G587 Mononeuritis multiplex: Secondary | ICD-10-CM

## 2011-04-11 DIAGNOSIS — M549 Dorsalgia, unspecified: Secondary | ICD-10-CM | POA: Insufficient documentation

## 2011-04-11 DIAGNOSIS — Z79899 Other long term (current) drug therapy: Secondary | ICD-10-CM | POA: Insufficient documentation

## 2011-04-11 DIAGNOSIS — Z86718 Personal history of other venous thrombosis and embolism: Secondary | ICD-10-CM | POA: Insufficient documentation

## 2011-04-11 NOTE — Assessment & Plan Note (Signed)
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  Had a positive c-ANCA testing in [facility].  A skin biopsy showing vasculitis diagnosed with Wegener granulomatosis, started on Rituxan, high-dose steroids, gradually reducing dosages on steroids. Had a hospital course complicated by DVT.  Went through inpatient rehab from March 02, 2011 to March 15, 2011.  She has returned home.  She gets home health therapy which has been doing "wonders" for her, is still not using the steps much, stays on the first floor, is now ambulating without the walker just using a cane, does not need a supervision for ambulation anymore.  Her motor strength is 5/5 bilateral deltoid, biceps, triceps, grip, 4/5 bilateral knee extensors, ankle dorsiflexors, 3+ at the hip flexors. Sensation reduced on the plantar surface of the foot bilaterally.  Hip and knee ankle range of motion are intact.  IMPRESSION: 1. Mononeuritis multiplex plus severe deconditioning improving, has a     history of Wegener granulomatosis as the underlying etiology. 2. Back pain may have some radicular involvement as well.  We would     not do any type of back injections given that she is on     anticoagulation for DVT, would not be good time take her off this.     I will see her in about 1-2 months.  Reevaluate after therapy.  No     driving for now.     Charlett Blake, M.D. Electronically Signed    AEK/MedQ D:  04/11/2011 13:19:05  T:  04/11/2011 17:24:51  Job #:  [id number]  cc:   Nicanor Alcon, M.D. [address] [place], [place] [address]

## 2011-04-14 ENCOUNTER — Telehealth: Payer: Self-pay | Admitting: Internal Medicine

## 2011-04-14 ENCOUNTER — Ambulatory Visit (INDEPENDENT_AMBULATORY_CARE_PROVIDER_SITE_OTHER): Payer: 59 | Admitting: *Deleted

## 2011-04-14 DIAGNOSIS — D649 Anemia, unspecified: Secondary | ICD-10-CM

## 2011-04-14 DIAGNOSIS — I82409 Acute embolism and thrombosis of unspecified deep veins of unspecified lower extremity: Secondary | ICD-10-CM

## 2011-04-14 DIAGNOSIS — I2699 Other pulmonary embolism without acute cor pulmonale: Secondary | ICD-10-CM

## 2011-04-14 LAB — CBC WITH DIFFERENTIAL/PLATELET
Basophils Absolute: 0 10*3/uL (ref 0.0–0.1)
Basophils Relative: 0.1 % (ref 0.0–3.0)
Eosinophils Absolute: 0 10*3/uL (ref 0.0–0.7)
Eosinophils Relative: 0.1 % (ref 0.0–5.0)
HCT: 35.5 % — ABNORMAL LOW (ref 36.0–46.0)
Hemoglobin: 11.4 g/dL — ABNORMAL LOW (ref 12.0–15.0)
Lymphocytes Relative: 7.3 % — ABNORMAL LOW (ref 12.0–46.0)
Lymphs Abs: 1.6 10*3/uL (ref 0.7–4.0)
MCHC: 32 g/dL (ref 30.0–36.0)
MCV: 94.9 fl (ref 78.0–100.0)
Monocytes Absolute: 0.4 10*3/uL (ref 0.1–1.0)
Monocytes Relative: 1.9 % — ABNORMAL LOW (ref 3.0–12.0)
Neutro Abs: 19.7 10*3/uL — ABNORMAL HIGH (ref 1.4–7.7)
Neutrophils Relative %: 90.6 % — ABNORMAL HIGH (ref 43.0–77.0)
Platelets: 513 10*3/uL — ABNORMAL HIGH (ref 150.0–400.0)
RBC: 3.74 Mil/uL — ABNORMAL LOW (ref 3.87–5.11)
RDW: 21 % — ABNORMAL HIGH (ref 11.5–14.6)
WBC: 21.7 10*3/uL (ref 4.5–10.5)

## 2011-04-14 LAB — POCT INR: INR: 3.5

## 2011-04-14 NOTE — Telephone Encounter (Signed)
Received call from [facility] in regards to pt lab elevated white count of 21.0. Per Hop fax information to physician at [facility] Dr. Vernon Prey.

## 2011-04-14 NOTE — Patient Instructions (Signed)
Per Hop change 5 mg daily recheck in 2 week stop Lovenox.

## 2011-04-17 ENCOUNTER — Other Ambulatory Visit: Payer: Self-pay | Admitting: Internal Medicine

## 2011-04-17 NOTE — Telephone Encounter (Signed)
Left message on voicemail for patient to return call with the name of specialist for lab work to be forwarded. Copy of labs mailed to patient

## 2011-04-17 NOTE — Telephone Encounter (Signed)
Message copied by Secundino Ginger on Mon Apr 17, 2011 10:27 AM ------      Message from: Hendricks Limes      Created: Fri Apr 14, 2011  5:32 PM       She was seen for PT/INR today. Lovenox injections were discontinued as the PT/INR was 3.5. Warfarin 5 mg daily was prescribed with a PT/INR in 2 weeks. The white count is elevated, but she is a symptomatic. Results to  be sent to her specialist at the [facility].

## 2011-04-27 ENCOUNTER — Telehealth: Payer: Self-pay

## 2011-04-27 NOTE — Telephone Encounter (Signed)
Pt has been having home therapy for couple weeks orderd from Dr. Jearld Fenton. Dr. Jearld Fenton has relased patient and does not need to see her unless she is having an issue.   Pt's orders are about to expire for PT and  Elder Love from [place] would like to know if Dr. Linna Darner will give a verbal order to extend PT for a couple of more weeks especially because patient lives alone. Patient is not quite ready to be released and new orders are needed and by tomorrow.  Pls advise.  Elder Love can be reached at [phone]

## 2011-04-27 NOTE — Telephone Encounter (Signed)
Called Juliann Pulse and left message Per Dr.Hopper ok to extend PT order

## 2011-04-28 ENCOUNTER — Ambulatory Visit (INDEPENDENT_AMBULATORY_CARE_PROVIDER_SITE_OTHER): Payer: 59 | Admitting: *Deleted

## 2011-04-28 DIAGNOSIS — I2699 Other pulmonary embolism without acute cor pulmonale: Secondary | ICD-10-CM

## 2011-04-28 DIAGNOSIS — Z7901 Long term (current) use of anticoagulants: Secondary | ICD-10-CM

## 2011-04-28 DIAGNOSIS — I82409 Acute embolism and thrombosis of unspecified deep veins of unspecified lower extremity: Secondary | ICD-10-CM

## 2011-04-28 LAB — POCT INR: INR: 1.3

## 2011-04-28 NOTE — Patient Instructions (Addendum)
Return to office in 10 days for PT/INR   New dosing: 10 mg today then 7.5 mg daily (1 1/2 tab) except 5 mg (1 tab) on T, TH, Sat

## 2011-05-02 ENCOUNTER — Telehealth: Payer: Self-pay

## 2011-05-02 MED ORDER — AMBULATORY NON FORMULARY MEDICATION: Status: DC

## 2011-05-02 NOTE — Telephone Encounter (Signed)
Left message on voicemail for [name] to call me back with fax number

## 2011-05-02 NOTE — Telephone Encounter (Signed)
Juliann Pulse called back, order faxed to [phone]

## 2011-05-02 NOTE — Telephone Encounter (Signed)
Physical therapist with [facility] advises that pt has been cleared to drive and now needs an order to continue physical therapy sent to [facility]. Please advise

## 2011-05-02 NOTE — Telephone Encounter (Signed)
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

## 2011-05-04 ENCOUNTER — Other Ambulatory Visit: Payer: Self-pay | Admitting: Internal Medicine

## 2011-05-04 NOTE — Telephone Encounter (Signed)
Dr.Hopper please advise, patient requesting ABX

## 2011-05-05 NOTE — Telephone Encounter (Signed)
I spoke with patient and discussed Dr.Hopper's response to refill request. Patient was offered appointment to see Dr.Lowne or Larose Kells today (only 2 doctors in office), patient refused appointment and stated " I don't know if I have a UTI, Im having frequency but that is a side effect of Wegener Vasculitis." Patient with pending nurse visit appointment on Tuesday and states she will wait to see Dr.Hopper then. Patient was told she would have to change her Nurse visit to an appointment with the Dr.,  Patient then said "Let me think about all this and I will call you back if I want to change appointment."  Patient was made aware of [facility] as an option and stated she will call us back if needed

## 2011-05-05 NOTE — Telephone Encounter (Signed)
She would need clean catch urine for dip & C&S before this can be filled to document whether significant UTI present. Criteria for a significant Urinary Tract Infection (UTI) are: over 100,000 colonies of a single, not multiple  bacteria. If these are not present, symptoms may be due to non infectious causes such as bladder  inflammation or spasm (Ex  Interstitial Cystitis).

## 2011-05-08 ENCOUNTER — Ambulatory Visit: Payer: 59

## 2011-05-09 ENCOUNTER — Ambulatory Visit (INDEPENDENT_AMBULATORY_CARE_PROVIDER_SITE_OTHER): Payer: 59

## 2011-05-09 DIAGNOSIS — I82409 Acute embolism and thrombosis of unspecified deep veins of unspecified lower extremity: Secondary | ICD-10-CM

## 2011-05-09 DIAGNOSIS — Z7901 Long term (current) use of anticoagulants: Secondary | ICD-10-CM

## 2011-05-09 DIAGNOSIS — R350 Frequency of micturition: Secondary | ICD-10-CM

## 2011-05-09 DIAGNOSIS — R319 Hematuria, unspecified: Secondary | ICD-10-CM

## 2011-05-09 LAB — POCT URINALYSIS DIPSTICK
Bilirubin, UA: NEGATIVE
Glucose, UA: NEGATIVE
Ketones, UA: NEGATIVE
Leukocytes, UA: NEGATIVE
Nitrite, UA: NEGATIVE
Protein, UA: 30
Spec Grav, UA: 1.01
Urobilinogen, UA: 0.2
pH, UA: 7.5

## 2011-05-09 LAB — POCT INR
INR: 1.8
INR: 1.8

## 2011-05-11 LAB — URINE CULTURE: Colony Count: 10000

## 2011-05-12 ENCOUNTER — Other Ambulatory Visit: Payer: Self-pay | Admitting: Internal Medicine

## 2011-05-12 DIAGNOSIS — I82409 Acute embolism and thrombosis of unspecified deep veins of unspecified lower extremity: Secondary | ICD-10-CM

## 2011-05-12 DIAGNOSIS — I2699 Other pulmonary embolism without acute cor pulmonale: Secondary | ICD-10-CM

## 2011-05-12 NOTE — Telephone Encounter (Signed)
Left message to call office on Pt home phone

## 2011-05-12 NOTE — Telephone Encounter (Signed)
Discussed with patient and voiced understanding, she declined an apt at this time     KP

## 2011-05-12 NOTE — Telephone Encounter (Signed)
No UTI present ( see C&S); would need OV

## 2011-05-12 NOTE — Telephone Encounter (Signed)
Patient requesting refill on ABX, Dr.Hopper please advise

## 2011-05-15 ENCOUNTER — Inpatient Hospital Stay (INDEPENDENT_AMBULATORY_CARE_PROVIDER_SITE_OTHER)
Admission: RE | Admit: 2011-05-15 | Discharge: 2011-05-15 | Disposition: A | Discharge status: Home / Self Care | Payer: 59 | Source: Ambulatory Visit | Attending: Family Medicine | Admitting: Family Medicine

## 2011-05-15 DIAGNOSIS — L723 Sebaceous cyst: Secondary | ICD-10-CM

## 2011-05-16 ENCOUNTER — Telehealth: Payer: Self-pay

## 2011-05-16 NOTE — Telephone Encounter (Signed)
Message copied by Secundino Ginger on Tue May 16, 2011 10:01 AM ------      Message from: Hendricks Limes      Created: Thu May 11, 2011  5:59 PM       Criteria for a significant Urinary Tract Infection (UTI) are: over 100,000 colonies of a single, not multiple  bacteria. If these are not present, symptoms may be due to non infectious causes such as bladder  inflammation or spasm (Ex  Interstitial Cystitis). A Urology referral is recommended for recurrent or persistent symptoms to rule out such processes.       [employer]

## 2011-05-16 NOTE — Telephone Encounter (Signed)
Spoke with patient, patient aware of Dr.Hopper's response and states she is symptom free at this time and no need for urology referral

## 2011-05-16 NOTE — Telephone Encounter (Signed)
Message copied by Secundino Ginger on Tue May 16, 2011 10:00 AM ------      Message from: Hendricks Limes      Created: Thu May 11, 2011  5:59 PM       Criteria for a significant Urinary Tract Infection (UTI) are: over 100,000 colonies of a single, not multiple  bacteria. If these are not present, symptoms may be due to non infectious causes such as bladder  inflammation or spasm (Ex  Interstitial Cystitis). A Urology referral is recommended for recurrent or persistent symptoms to rule out such processes.       [employer]

## 2011-05-17 MED ORDER — ERGOCALCIFEROL 1.25 MG (50000 UT) PO CAPS: 50000.0000 [IU] | ORAL_CAPSULE | ORAL | Status: DC

## 2011-05-17 MED ORDER — WARFARIN SODIUM 5 MG PO TABS: 5.0000 mg | ORAL_TABLET | Freq: Every day | ORAL | Status: DC

## 2011-05-18 ENCOUNTER — Other Ambulatory Visit: Payer: Self-pay | Admitting: Internal Medicine

## 2011-05-18 NOTE — Telephone Encounter (Signed)
Filled yesterday

## 2011-05-23 ENCOUNTER — Encounter: Payer: Self-pay | Admitting: Internal Medicine

## 2011-05-23 ENCOUNTER — Ambulatory Visit (INDEPENDENT_AMBULATORY_CARE_PROVIDER_SITE_OTHER): Payer: 59 | Admitting: Internal Medicine

## 2011-05-23 DIAGNOSIS — I82409 Acute embolism and thrombosis of unspecified deep veins of unspecified lower extremity: Secondary | ICD-10-CM

## 2011-05-23 DIAGNOSIS — R5381 Other malaise: Secondary | ICD-10-CM

## 2011-05-23 DIAGNOSIS — Z23 Encounter for immunization: Secondary | ICD-10-CM

## 2011-05-23 DIAGNOSIS — R2689 Other abnormalities of gait and mobility: Secondary | ICD-10-CM

## 2011-05-23 DIAGNOSIS — L989 Disorder of the skin and subcutaneous tissue, unspecified: Secondary | ICD-10-CM

## 2011-05-23 DIAGNOSIS — Z7901 Long term (current) use of anticoagulants: Secondary | ICD-10-CM

## 2011-05-23 DIAGNOSIS — R531 Weakness: Secondary | ICD-10-CM

## 2011-05-23 DIAGNOSIS — R5383 Other fatigue: Secondary | ICD-10-CM

## 2011-05-23 LAB — POCT INR: INR: 3.4

## 2011-05-23 NOTE — Patient Instructions (Addendum)
Take 5 mg of Coumadin today & then 5 mg M, W, F & Sun. Take 7.5 mg T, Th , & Sat with  PT/INR in 2 weeks.Use warm moist compresses 3 times a day or Sitz bath as discussed.  Eat a low-fat diet with lots of fruits and vegetables, up to 7-9 servings per day. .Consume less than 30 grams of sugar per day from foods & drinks with High Fructose Corn Sugar as #1,2,3 or # 4 on label. Follow the low carb nutrition program in [facility] [place] as closely as possible to prevent Diabetes progression & complications. White carbohydrates (potatoes, rice, bread, and pasta) have a high spike of sugar and a high load of sugar. For example a  baked potato has a cup of sugar and a  french fry  2 teaspoons of sugar. Yams, wild  rice, whole grained bread &  wheat pasta have been much lower spike and load of  sugar. Portions should be the size of a deck of cards or your palm.

## 2011-05-23 NOTE — Progress Notes (Signed)
  Subjective:    Patient ID: [name], female    DOB: Jun 13, 1951, 60 y.o.   MRN: [id number]  HPI ? Cyst Location: L buttocks Onset:2 weeks ago as pain & tenderness Trigger/injury:no Character:no redness or  swelling Constitutional: no fever, chills, sweats.Weight up 6 # in 2 weeks Heme:  bruising with minimal trauma, no lymphadenopathy Treatment/response:moist heat with some benefit     Review of Systems she is on prophylactic generic Septra DS 3 times a week as part of the protocol from [facility] to prevent urinary tract infections.  She denies pyuria, hematuria or dysuria.  Understandably, she remains weak with some imbalance. She's requesting referral to physical therapy which is most appropriate.     Objective:   Physical Exam   She is in no acute distress; she does exhibit some cushingoid changes in the context of steroids.  An S4 is present without any significant murmur or gallop.  Chest is clear to auscultation with no increased work of breathing, rhonchi, or wheezes.  She has diffuse ecchymoses most notably over forearms. There is very faint, resolving ecchymoses over the left buttocks. There is no visible lesion in this area. There is a pea size subcutaneous lesion suggestive of phlebolith or a lipoma.  She has no lymphadenopathy about the head, neck, axilla        Assessment & Plan:  #1 buttocks lesion is most likely a phlebolith from a ruptured vein in view of the resolving ecchymosis in the context of Coumadin therapy  #2 anticoagulation; PT/INR is 3.4. Adjustment will be made in the warfarin. Plan: See orders and recommendations

## 2011-06-06 ENCOUNTER — Ambulatory Visit: Payer: 59 | Attending: Internal Medicine | Admitting: Rehabilitative and Restorative Service Providers"

## 2011-06-06 DIAGNOSIS — IMO0001 Reserved for inherently not codable concepts without codable children: Secondary | ICD-10-CM | POA: Insufficient documentation

## 2011-06-06 DIAGNOSIS — M6281 Muscle weakness (generalized): Secondary | ICD-10-CM | POA: Insufficient documentation

## 2011-06-06 DIAGNOSIS — R269 Unspecified abnormalities of gait and mobility: Secondary | ICD-10-CM | POA: Insufficient documentation

## 2011-06-09 ENCOUNTER — Ambulatory Visit: Payer: 59 | Admitting: Rehabilitative and Restorative Service Providers"

## 2011-06-12 ENCOUNTER — Ambulatory Visit: Payer: 59 | Admitting: Rehabilitative and Restorative Service Providers"

## 2011-06-13 ENCOUNTER — Encounter: Payer: 59 | Attending: Physical Medicine & Rehabilitation

## 2011-06-13 ENCOUNTER — Ambulatory Visit (HOSPITAL_BASED_OUTPATIENT_CLINIC_OR_DEPARTMENT_OTHER): Payer: 59 | Admitting: Physical Medicine & Rehabilitation

## 2011-06-13 DIAGNOSIS — Z86718 Personal history of other venous thrombosis and embolism: Secondary | ICD-10-CM | POA: Insufficient documentation

## 2011-06-13 DIAGNOSIS — G587 Mononeuritis multiplex: Secondary | ICD-10-CM | POA: Insufficient documentation

## 2011-06-13 DIAGNOSIS — Z79899 Other long term (current) drug therapy: Secondary | ICD-10-CM | POA: Insufficient documentation

## 2011-06-13 DIAGNOSIS — M549 Dorsalgia, unspecified: Secondary | ICD-10-CM | POA: Insufficient documentation

## 2011-06-13 NOTE — Assessment & Plan Note (Signed)
REASON FOR VISIT:  Neuropathy causing gait imbalance.  A 60 year old female who was working as a Marine scientist at [employer], had a prolonged illness paraspinal mass.  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 Started on Rituxan and high-dose steroids, gradually reducing doses of steroids.  Her hospital course was complicated by DVT and has been on anticoagulation now.  She went through inpatient rehab July 12 through March 15, 2011.  She has returned to home.  First, she was getting home health therapy, now she is getting some outpatient therapy.  She has resumed driving.  She states that her foot numbness has improved and she feels the paddles without difficulty.  PHYSICAL EXAMINATION:  A 5/5 strength in the upper extremities.  The lower extremity is 4/5 strength.  IMPRESSION: 1. 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 2. Gait imbalance, had a fall 2 months ago, but no recurrence since     that time 3. I feel overall that she can drive without difficulties.  I do think     she needs some additional physical therapy, and she will continue     this until I see her back in about 6 weeks at which point she     should be finished with it.  I discussed patient care and friend     answered questions.  We did discuss her alopecia.  This is likely     stress of illness plus/minus prednisone.     Charlett Blake, M.D. Electronically Signed    AEK/MedQ D:  06/13/2011 11:20:14  T:  06/13/2011 12:30:34  Job #:  [id number]  cc:   Elta Guadeloupe A. Perini, M.D. Fax: [phone]

## 2011-06-14 ENCOUNTER — Ambulatory Visit: Payer: 59 | Admitting: Rehabilitative and Restorative Service Providers"

## 2011-06-20 ENCOUNTER — Ambulatory Visit: Payer: 59 | Admitting: Rehabilitative and Restorative Service Providers"

## 2011-06-23 ENCOUNTER — Ambulatory Visit: Payer: 59 | Attending: Internal Medicine | Admitting: Rehabilitative and Restorative Service Providers"

## 2011-06-23 DIAGNOSIS — IMO0001 Reserved for inherently not codable concepts without codable children: Secondary | ICD-10-CM | POA: Insufficient documentation

## 2011-06-23 DIAGNOSIS — M6281 Muscle weakness (generalized): Secondary | ICD-10-CM | POA: Insufficient documentation

## 2011-06-23 DIAGNOSIS — R269 Unspecified abnormalities of gait and mobility: Secondary | ICD-10-CM | POA: Insufficient documentation

## 2011-06-27 ENCOUNTER — Ambulatory Visit: Payer: 59 | Admitting: Rehabilitative and Restorative Service Providers"

## 2011-06-27 ENCOUNTER — Ambulatory Visit: Payer: 59 | Admitting: Physical Therapy

## 2011-06-30 ENCOUNTER — Ambulatory Visit: Payer: 59 | Admitting: Physical Therapy

## 2011-07-04 ENCOUNTER — Ambulatory Visit: Payer: 59 | Admitting: Rehabilitative and Restorative Service Providers"

## 2011-07-07 ENCOUNTER — Ambulatory Visit: Payer: 59 | Admitting: Rehabilitative and Restorative Service Providers"

## 2011-07-17 ENCOUNTER — Other Ambulatory Visit: Payer: Self-pay | Admitting: Obstetrics and Gynecology

## 2011-07-17 DIAGNOSIS — Z1231 Encounter for screening mammogram for malignant neoplasm of breast: Secondary | ICD-10-CM

## 2011-07-18 ENCOUNTER — Ambulatory Visit: Payer: 59 | Admitting: Rehabilitative and Restorative Service Providers"

## 2011-07-25 ENCOUNTER — Ambulatory Visit: Payer: 59 | Admitting: Physical Medicine & Rehabilitation

## 2011-07-25 ENCOUNTER — Ambulatory Visit: Payer: 59 | Admitting: Rehabilitative and Restorative Service Providers"

## 2011-08-01 ENCOUNTER — Ambulatory Visit: Payer: 59 | Attending: Internal Medicine | Admitting: Rehabilitative and Restorative Service Providers"

## 2011-08-01 DIAGNOSIS — R269 Unspecified abnormalities of gait and mobility: Secondary | ICD-10-CM | POA: Insufficient documentation

## 2011-08-01 DIAGNOSIS — IMO0001 Reserved for inherently not codable concepts without codable children: Secondary | ICD-10-CM | POA: Insufficient documentation

## 2011-08-01 DIAGNOSIS — M6281 Muscle weakness (generalized): Secondary | ICD-10-CM | POA: Insufficient documentation

## 2011-08-08 ENCOUNTER — Ambulatory Visit: Payer: 59 | Admitting: Rehabilitative and Restorative Service Providers"

## 2011-08-18 ENCOUNTER — Ambulatory Visit (HOSPITAL_COMMUNITY)
Admission: RE | Admit: 2011-08-18 | Discharge: 2011-08-18 | Disposition: A | Discharge status: Home / Self Care | Payer: 59 | Source: Ambulatory Visit | Attending: Obstetrics and Gynecology | Admitting: Obstetrics and Gynecology

## 2011-08-18 DIAGNOSIS — Z1231 Encounter for screening mammogram for malignant neoplasm of breast: Secondary | ICD-10-CM | POA: Insufficient documentation

## 2011-08-24 ENCOUNTER — Ambulatory Visit: Payer: 59 | Attending: Internal Medicine | Admitting: Rehabilitative and Restorative Service Providers"

## 2011-08-24 DIAGNOSIS — IMO0001 Reserved for inherently not codable concepts without codable children: Secondary | ICD-10-CM | POA: Insufficient documentation

## 2011-08-24 DIAGNOSIS — R269 Unspecified abnormalities of gait and mobility: Secondary | ICD-10-CM | POA: Insufficient documentation

## 2011-08-24 DIAGNOSIS — M6281 Muscle weakness (generalized): Secondary | ICD-10-CM | POA: Insufficient documentation

## 2011-08-29 ENCOUNTER — Other Ambulatory Visit: Payer: Self-pay | Admitting: Obstetrics and Gynecology

## 2011-08-29 DIAGNOSIS — R928 Other abnormal and inconclusive findings on diagnostic imaging of breast: Secondary | ICD-10-CM

## 2011-08-31 ENCOUNTER — Encounter: Payer: 59 | Admitting: Rehabilitative and Restorative Service Providers"

## 2011-09-04 ENCOUNTER — Ambulatory Visit
Admission: RE | Admit: 2011-09-04 | Discharge: 2011-09-04 | Disposition: A | Discharge status: Home / Self Care | Payer: 59 | Source: Ambulatory Visit | Attending: Obstetrics and Gynecology | Admitting: Obstetrics and Gynecology

## 2011-09-04 DIAGNOSIS — R928 Other abnormal and inconclusive findings on diagnostic imaging of breast: Secondary | ICD-10-CM

## 2011-10-02 ENCOUNTER — Ambulatory Visit (INDEPENDENT_AMBULATORY_CARE_PROVIDER_SITE_OTHER): Payer: 59 | Admitting: Internal Medicine

## 2011-10-02 ENCOUNTER — Encounter: Payer: Self-pay | Admitting: Internal Medicine

## 2011-10-02 VITALS — BP 110/70 | HR 70 | Ht 63.5 in | Wt 207.4 lb

## 2011-10-02 DIAGNOSIS — D509 Iron deficiency anemia, unspecified: Secondary | ICD-10-CM

## 2011-10-02 DIAGNOSIS — R195 Other fecal abnormalities: Secondary | ICD-10-CM

## 2011-10-02 DIAGNOSIS — K219 Gastro-esophageal reflux disease without esophagitis: Secondary | ICD-10-CM

## 2011-10-02 DIAGNOSIS — D689 Coagulation defect, unspecified: Secondary | ICD-10-CM

## 2011-10-02 NOTE — Progress Notes (Signed)
HISTORY OF PRESENT ILLNESS:  [name] is a 61 y.o. female with multiple significant medical problems including hypertension, obesity with obstructive sleep apnea, hyperlipidemia, GERD complicated by peptic stricture, and recurrent iron deficiency anemia of uncertain cause. She was last seen in this office in June of 2011 regarding recurrent iron deficiency anemia. See that dictation for details. Upper endoscopy was planned, but the patient did not follow through with scheduling. 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 She is followed at [facility] for her Wegener's granulomatosis. She also reports some form of splenic injury for which she underwent what sounds like a cauterization procedure. Since I last saw her, she is changed primary care provider from Dr. Unice Cobble to Dr. Crist Infante. She is sent today by Dr. [name] regarding Hemoccult-positive stool identified on outpatient testing dated 09/13/2011. Her most recent sedimentation rate was 35. Hemoglobin from 05/26/2011 was 12.9 with an MCV of 97.6. Prior GI evaluations included colonoscopy in 2004 and again in 2007. Normal examinations except for sigmoid diverticulosis. Normal ileum. Prior EGD in 2007 revealed peptic stricture, but was otherwise normal. Capsule endoscopy in July of 2009 reason question of subtle villous blunting as well as a few scattered red spots throughout the more distal small bowel. Serologic testing for celiac sprue was negative. The patient's reflux is under good control with once daily Nexium. Off medication, significant breakthrough. She has maintained herself on chronic iron therapy. She denies abdominal pain, change in bowel habits, melena, or hematochezia. No weight loss.  REVIEW OF SYSTEMS:  All non-GI ROS negative  except for arthritis, back pain, hearing loss, ankle edema, voice change, hair loss.  Past Medical History  Diagnosis Date  . Hypertension   . Anemia     recurrent iron defic.  Marland Kitchen OSA (obstructive sleep apnea)   . Allergic rhinitis   . GERD (gastroesophageal reflux disease)   . Hypertension   . Osteoarthritis   . DDD (degenerative disc disease)   . Depression     Dr. Toy Care (situational, divorce, loss of parents)  . Diverticulosis   . Hyperlipidemia   . Hiatal hernia   . Esophageal stricture   . Wegner's disease (congenital syphilitic osteochondritis) 2012    DR. Ginger Organ at [facility]  . Neuropathy, peripheral   . DVT (deep venous thrombosis)     both legs  . Pulmonary embolism     left lung    Past Surgical History  Procedure Date  . Lung biospy may 2012  . Bronchoscopy april 2012    Social History [name]  reports that she has never smoked. She has never used smokeless tobacco. She reports that she drinks alcohol. She reports that she does not use illicit drugs.  family history includes Arthritis in her father; Diabetes in her maternal aunt, maternal uncle, and paternal grandfather; Hypertension in her mother; Lung cancer in her father; Neuropathy in her mother; and Stroke in her father.  There is no history of Colon cancer.  Allergies  Allergen Reactions  . Latex     Sensitive to latex, rash  . Other     Adhesive tape causes rash  . Lyrica Swelling    Hands and feet  . Codeine     REACTION: itching       PHYSICAL EXAMINATION: Vital signs: BP 110/70  Pulse 70  Ht 5' 3.5" (1.613 m)  Wt 207 lb 6.4 oz (94.076 kg)  BMI 36.16 kg/m2  Constitutional: obese, chronically ill-appearing, no acute distress Psychiatric: alert and oriented x3, cooperative Eyes: extraocular movements intact, anicteric, conjunctiva pink Mouth: oral pharynx moist, no lesions Neck: supple no lymphadenopathy Cardiovascular: heart regular rate and rhythm, no murmur Lungs: clear to  auscultation bilaterally Abdomen: soft,obese, nontender, nondistended, no obvious ascites, no peritoneal signs, normal bowel sounds, no organomegaly Rectal:deferred until colonoscopy Extremities: no lower extremity edema. Scattered ecchymoses Skin: no lesions on visible extremities Neuro: No focal deficits.   ASSESSMENT:  #1. Hemoccult-positive stool #2. History of recurrent iron deficiency anemia of uncertain cause. Prior colonoscopies, capsule endoscopy, an upper endoscopy as outlined above. Currently with normal blood counts. #3. GERD, symptoms controlled with PPI #4. History of pulmonary embolus, on Coumadin #5. Multiple medical problems including Wegener's granulomatosis   PLAN:  #1. Reflux precautions with attention to weight loss #2. Continue PPI to control GERD symptoms #3. Colonoscopy and upper endoscopy to evaluate Hemoccult-positive stool in a patient with a history of recurrent iron deficiency anemia, uncertain cause, and currently on chronic anticoagulation therapy. She is high-risk given her comorbidities.The nature of the procedure, as well as the risks, benefits, and alternatives were carefully and thoroughly reviewed with the patient. Ample time for discussion and questions allowed. The patient understood, was satisfied, and agreed to proceed. Movi prep prescribed. The patient instructed on its use #4. Lovenox bridge to perform procedures. This will be coordinated by the pharm D at Dr. Silvestre Mesi office #5. Hold iron therapy 5-7 days prior to the procedures, to maximize bowel preparation

## 2011-10-02 NOTE — Patient Instructions (Signed)
You have been scheduled for an endoscopy and colonoscopy. Please follow the written instructions given to you at your visit today. Please pick up yourprep at the pharmacy within the next 2-3 days.  Please stop taking your iron for 5-7 days prior to the procedure.

## 2011-10-03 ENCOUNTER — Telehealth: Payer: Self-pay

## 2011-10-03 ENCOUNTER — Other Ambulatory Visit: Payer: Self-pay

## 2011-10-03 NOTE — Telephone Encounter (Signed)
Patient with pending colonoscopy, [name] from GI called to see if Dr.Hopper was monitoring patient's PT/INR-Coumadin. After reviewing chart I did not see where Dr.Hopper had recently filled Coumadin or if patient had PT/INR recently. I informed [name] I will f/u with patient and have her contact GI   I called patient and left message on her cell phone informing her to call GI @ [phone] and to speak with [name] to further discuss who is monitoring her Coumadin-PT/INR

## 2011-10-19 ENCOUNTER — Telehealth: Payer: Self-pay

## 2011-10-19 NOTE — Telephone Encounter (Signed)
Per Amanda/Dr. [name]:  Stop coumadin 5 days prior to procedure.  Start Lovenox at that time until night before procedure.  Do not take either medication the morning of procedure.  Take the coumadin and Lovenox the night of the procedure and overlap the 2 for 5 days.  Then discontinue the Lovenox and continue taking coumadin as originally prescribed

## 2011-10-30 ENCOUNTER — Other Ambulatory Visit: Payer: Self-pay

## 2011-10-30 ENCOUNTER — Telehealth: Payer: Self-pay | Admitting: Internal Medicine

## 2011-10-30 ENCOUNTER — Telehealth: Payer: Self-pay

## 2011-10-30 MED ORDER — PEG-KCL-NACL-NASULF-NA ASC-C 100 G PO SOLR: 1.0000 | Freq: Once | ORAL | Status: DC

## 2011-10-30 NOTE — Telephone Encounter (Signed)
Called pt to verify I had called a new moviprep rx into the pharmacy for her procedure tomorrow

## 2011-10-30 NOTE — Telephone Encounter (Signed)
Called pt to verify I was calling a new moviprep rx in for her

## 2011-10-31 ENCOUNTER — Encounter: Payer: Self-pay | Admitting: Internal Medicine

## 2011-10-31 ENCOUNTER — Ambulatory Visit (AMBULATORY_SURGERY_CENTER): Payer: 59 | Admitting: Internal Medicine

## 2011-10-31 VITALS — BP 121/69 | HR 79 | Temp 96.7°F | Resp 29 | Ht 63.0 in | Wt 207.0 lb

## 2011-10-31 DIAGNOSIS — D689 Coagulation defect, unspecified: Secondary | ICD-10-CM

## 2011-10-31 DIAGNOSIS — K222 Esophageal obstruction: Secondary | ICD-10-CM

## 2011-10-31 DIAGNOSIS — K449 Diaphragmatic hernia without obstruction or gangrene: Secondary | ICD-10-CM

## 2011-10-31 DIAGNOSIS — R195 Other fecal abnormalities: Secondary | ICD-10-CM

## 2011-10-31 DIAGNOSIS — K219 Gastro-esophageal reflux disease without esophagitis: Secondary | ICD-10-CM

## 2011-10-31 DIAGNOSIS — K5730 Diverticulosis of large intestine without perforation or abscess without bleeding: Secondary | ICD-10-CM

## 2011-10-31 MED ORDER — SODIUM CHLORIDE 0.9 % IV SOLN: 500.0000 mL | INTRAVENOUS | Status: DC

## 2011-10-31 NOTE — Op Note (Signed)
[facility] [address] [facility] [address]. [place], [place]  [address]  COLONOSCOPY PROCEDURE REPORT  PATIENT:  [name], [name]  MR#:  [id number] BIRTHDATE:  [birth year], 61 yrs. old  GENDER:  female ENDOSCOPIST:  Docia Chuck. Geri Seminole, MD REF. BY:  Crist Infante, M.D. PROCEDURE DATE:  10/31/2011 PROCEDURE:  Diagnostic Colonoscopy ASA CLASS:  Class III INDICATIONS:  heme positive stool ; prior colonoscopy 2004. 2007 MEDICATIONS:   MAC sedation, administered by CRNA, propofol (Diprivan) 250 mg IV  DESCRIPTION OF PROCEDURE:   After the risks benefits and alternatives of the procedure were thoroughly explained, informed consent was obtained.  Digital rectal exam was performed and revealed no abnormalities.   The LB CF-[id number] endoscope was introduced through the anus and advanced to the cecum, which was identified by both the appendix and ileocecal valve, without limitations.  The quality of the prep was excellent, using MoviPrep.  The instrument was then slowly withdrawn as the colon was fully examined. <<PROCEDUREIMAGES>>  FINDINGS:  Moderate diverticulosis was found in the sigmoid colon. Otherwise normal colonoscopy without  polyps, masses, vascular ectasias, or inflammatory changes.   Retroflexed views in the rectum revealed no abnormalities.    The time to cecum =  4:58 minutes. The scope was then withdrawn in  10:42  minutes from the cecum and the procedure completed.  COMPLICATIONS:  None  ENDOSCOPIC IMPRESSION: 1) Moderate diverticulosis in the sigmoid colon 2) Otherwise normal colonoscopy  RECOMMENDATIONS: 1) Continue current colorectal screening recommendations  with a repeat colonoscopy in 10 years. 2) Upper endoscopy today (see report)  ______________________________ Docia Chuck. Geri Seminole, MD  CC:  Crist Infante, MD;  The Patient  n. eSIGNED:   Docia Chuck. Geri Seminole at 10/31/2011 02:41 PM  Madaline Brilliant, [id number]

## 2011-10-31 NOTE — Op Note (Signed)
[facility] [address]. [place], [place]  [address]  ENDOSCOPY PROCEDURE REPORT  PATIENT:  Joyce, Kaufman  MR#:  [id number] BIRTHDATE:  17-Mar-1951, 61 yrs. old  GENDER:  female  ENDOSCOPIST:  Docia Chuck. Geri Seminole, MD Referred by:  Crist Infante, M.D.  PROCEDURE DATE:  10/31/2011 PROCEDURE:  EGD, diagnostic 43235 ASA CLASS:  Class III INDICATIONS:  hemoccult positive stool  MEDICATIONS:   MAC sedation, administered by CRNA, propofol (Diprivan) 150 mg IV TOPICAL ANESTHETIC:  none  DESCRIPTION OF PROCEDURE:   After the risks benefits and alternatives of the procedure were thoroughly explained, informed consent was obtained.  The LB GIF-H180 [id number] endoscope was introduced through the mouth and advanced to the second portion of the duodenum, without limitations.  The instrument was slowly withdrawn as the mucosa was fully examined. <<PROCEDUREIMAGES>>  A benign ring-like stricture was found in the distal esophagus.  A 5cm hiatal hernia, w/o erosions, was found.  Otherwise the examination to the second duodenum was normal.    Retroflexed views revealed the hiatal hernia.    The scope was then withdrawn from the patient and the procedure completed.  COMPLICATIONS:  None  ENDOSCOPIC IMPRESSION: 1) Stricture in the distal esophagus 2) Hiatal hernia 3) Otherwise normal examination 4) GERD  RECOMMENDATIONS: 1) Continue Nexium 2) Resume iron therapy 3) Resume coumadin and Lovenox per your coumadin clinic instructions  ______________________________ Docia Chuck. Geri Seminole, MD  CC:  Crist Infante, MD; The Patient  n. eSIGNED:   Docia Chuck. Geri Seminole at 10/31/2011 02:53 PM  Madaline Brilliant, [id number]

## 2011-10-31 NOTE — Patient Instructions (Signed)
YOU HAD AN ENDOSCOPIC PROCEDURE TODAY AT THE [facility]: Refer to the procedure report that was given to you for any specific questions about what was found during the examination.  If the procedure report does not answer your questions, please call your gastroenterologist to clarify.  If you requested that your care partner not be given the details of your procedure findings, then the procedure report has been included in a sealed envelope for you to review at your convenience later.  YOU SHOULD EXPECT: Some feelings of bloating in the abdomen. Passage of more gas than usual.  Walking can help get rid of the air that was put into your GI tract during the procedure and reduce the bloating. If you had a lower endoscopy (such as a colonoscopy or flexible sigmoidoscopy) you may notice spotting of blood in your stool or on the toilet paper. If you underwent a bowel prep for your procedure, then you may not have a normal bowel movement for a few days.  DIET: Your first meal following the procedure should be a light meal and then it is ok to progress to your normal diet.  A half-sandwich or bowl of soup is an example of a good first meal.  Heavy or fried foods are harder to digest and may make you feel nauseous or bloated.  Likewise meals heavy in dairy and vegetables can cause extra gas to form and this can also increase the bloating.  Drink plenty of fluids but you should avoid alcoholic beverages for 24 hours.  ACTIVITY: Your care partner should take you home directly after the procedure.  You should plan to take it easy, moving slowly for the rest of the day.  You can resume normal activity the day after the procedure however you should NOT DRIVE or use heavy machinery for 24 hours (because of the sedation medicines used during the test).    SYMPTOMS TO REPORT IMMEDIATELY: A gastroenterologist can be reached at any hour.  During normal business hours, 8:30 AM to 5:00 PM Monday through Friday,  call [phone].  After hours and on weekends, please call the GI answering service at [phone] who will take a message and have the physician on call contact you.   Following lower endoscopy (colonoscopy or flexible sigmoidoscopy):  Excessive amounts of blood in the stool  Significant tenderness or worsening of abdominal pains  Swelling of the abdomen that is new, acute  Fever of 100F or higher  Following upper endoscopy (EGD)  Vomiting of blood or coffee ground material  New chest pain or pain under the shoulder blades  Painful or persistently difficult swallowing  New shortness of breath  Fever of 100F or higher  Black, tarry-looking stools  FOLLOW UP: If any biopsies were taken you will be contacted by phone or by letter within the next 1-3 weeks.  Call your gastroenterologist if you have not heard about the biopsies in 3 weeks.  Our staff will call the home number listed on your records the next business day following your procedure to check on you and address any questions or concerns that you may have at that time regarding the information given to you following your procedure. This is a courtesy call and so if there is no answer at the home number and we have not heard from you through the emergency physician on call, we will assume that you have returned to your regular daily activities without incident.  SIGNATURES/CONFIDENTIALITY: You and/or your care   partner have signed paperwork which will be entered into your electronic medical record.  These signatures attest to the fact that that the information above on your After Visit Summary has been reviewed and is understood.  Full responsibility of the confidentiality of this discharge information lies with you and/or your care-partner.  

## 2011-10-31 NOTE — Progress Notes (Signed)
Patient did not experience any of the following events: a burn prior to discharge; a fall within the facility; wrong site/side/patient/procedure/implant event; or a hospital transfer or hospital admission upon discharge from the facility. (G8907) Patient did not have preoperative order for IV antibiotic SSI prophylaxis. (G8918)  

## 2011-11-01 ENCOUNTER — Telehealth: Payer: Self-pay | Admitting: *Deleted

## 2011-11-01 NOTE — Telephone Encounter (Signed)
  Follow up Call-  Call back number 10/31/2011  Post procedure Call Back phone  # [phone]  Permission to leave phone message Yes     Patient questions:c/o headache  Do you have a fever, pain , or abdominal swelling? no Pain Score  0 *  Have you tolerated food without any problems? yes  Have you been able to return to your normal activities? yes  Do you have any questions about your discharge instructions: Diet   no Medications  no Follow up visit  no  Do you have questions or concerns about your Care? no  Actions: * If pain score is 4 or above: No action needed, pain <4.

## 2011-11-15 ENCOUNTER — Telehealth: Payer: Self-pay | Admitting: *Deleted

## 2011-11-15 NOTE — Telephone Encounter (Signed)
Received call from Dr.Howie/[facility] asking if we had BMBX results on pt.  Paged Dr. At [phone] & gave her verbal report of BMBX from 5/12.

## 2012-02-13 ENCOUNTER — Other Ambulatory Visit (HOSPITAL_COMMUNITY): Payer: Self-pay | Admitting: *Deleted

## 2012-02-13 DIAGNOSIS — M313 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: Secondary | ICD-10-CM

## 2012-02-15 ENCOUNTER — Ambulatory Visit (HOSPITAL_COMMUNITY)
Admission: RE | Admit: 2012-02-15 | Discharge: 2012-02-15 | Disposition: A | Discharge status: Home / Self Care | Payer: 59 | Source: Ambulatory Visit | Attending: Internal Medicine | Admitting: Internal Medicine

## 2012-02-15 DIAGNOSIS — M313 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: Secondary | ICD-10-CM | POA: Insufficient documentation

## 2012-02-15 DIAGNOSIS — Z13820 Encounter for screening for osteoporosis: Secondary | ICD-10-CM | POA: Insufficient documentation

## 2012-03-07 ENCOUNTER — Ambulatory Visit (HOSPITAL_COMMUNITY)
Admission: RE | Admit: 2012-03-07 | Discharge: 2012-03-07 | Disposition: A | Discharge status: Home / Self Care | Payer: 59 | Source: Ambulatory Visit | Attending: Internal Medicine | Admitting: Internal Medicine

## 2012-03-07 DIAGNOSIS — I4891 Unspecified atrial fibrillation: Secondary | ICD-10-CM | POA: Insufficient documentation

## 2012-03-07 DIAGNOSIS — Z79899 Other long term (current) drug therapy: Secondary | ICD-10-CM | POA: Insufficient documentation

## 2012-03-07 MED ORDER — ALBUTEROL SULFATE (5 MG/ML) 0.5% IN NEBU
2.5000 mg | INHALATION_SOLUTION | Freq: Once | RESPIRATORY_TRACT | Status: AC
  Administered 2012-03-07: 2.5 mg via RESPIRATORY_TRACT

## 2012-07-23 ENCOUNTER — Other Ambulatory Visit: Payer: Self-pay | Admitting: Obstetrics and Gynecology

## 2012-07-23 DIAGNOSIS — Z1231 Encounter for screening mammogram for malignant neoplasm of breast: Secondary | ICD-10-CM

## 2012-08-27 ENCOUNTER — Ambulatory Visit: Payer: 59

## 2012-09-09 ENCOUNTER — Ambulatory Visit
Admission: RE | Admit: 2012-09-09 | Discharge: 2012-09-09 | Disposition: A | Discharge status: Home / Self Care | Payer: 59 | Source: Ambulatory Visit | Attending: Obstetrics and Gynecology | Admitting: Obstetrics and Gynecology

## 2012-09-09 DIAGNOSIS — Z1231 Encounter for screening mammogram for malignant neoplasm of breast: Secondary | ICD-10-CM

## 2012-10-29 ENCOUNTER — Ambulatory Visit
Admission: RE | Admit: 2012-10-29 | Discharge: 2012-10-29 | Disposition: A | Discharge status: Home / Self Care | Payer: 59 | Source: Ambulatory Visit | Attending: Internal Medicine | Admitting: Internal Medicine

## 2012-10-29 ENCOUNTER — Other Ambulatory Visit: Payer: Self-pay | Admitting: Internal Medicine

## 2012-10-29 DIAGNOSIS — M545 Low back pain, unspecified: Secondary | ICD-10-CM

## 2012-12-18 ENCOUNTER — Other Ambulatory Visit: Payer: Self-pay | Admitting: Dermatology

## 2012-12-20 ENCOUNTER — Other Ambulatory Visit: Payer: Self-pay | Admitting: Internal Medicine

## 2012-12-20 ENCOUNTER — Ambulatory Visit
Admission: RE | Admit: 2012-12-20 | Discharge: 2012-12-20 | Disposition: A | Discharge status: Home / Self Care | Payer: BC Managed Care – PPO | Source: Ambulatory Visit | Attending: Internal Medicine | Admitting: Internal Medicine

## 2012-12-20 DIAGNOSIS — M5136 Other intervertebral disc degeneration, lumbar region: Secondary | ICD-10-CM

## 2012-12-20 DIAGNOSIS — R509 Fever, unspecified: Secondary | ICD-10-CM

## 2012-12-20 DIAGNOSIS — M545 Low back pain, unspecified: Secondary | ICD-10-CM

## 2013-04-23 ENCOUNTER — Other Ambulatory Visit: Payer: Self-pay | Admitting: Dermatology

## 2013-06-21 HISTORY — PX: OTHER SURGICAL HISTORY: SHX169

## 2013-07-02 ENCOUNTER — Telehealth: Payer: Self-pay | Admitting: *Deleted

## 2013-07-02 ENCOUNTER — Other Ambulatory Visit: Payer: Self-pay | Admitting: Oncology

## 2013-07-02 NOTE — Telephone Encounter (Signed)
Spoke with [name] notifying her Dr. Beryle Beams has not seen this pt in over 2 years and this office has not received any communication from their office concerning this pt.  Dr. Beryle Beams would be glad to have a formal re-evaluation with pt if we are going to give transfusion in [facility].  At this point with hgb @ 8.8 would not give transfusion without further information.  [name] verbalized understanding and stated she would rely information to pt & MD.

## 2013-07-02 NOTE — Telephone Encounter (Signed)
Received message from [facility], Dr. Silvestre Mesi office [phone]) requesting this office to set-up blood transfusion for pt within the next week; Hgb today is 8.8. Note to Dr. Beryle Beams.

## 2013-07-04 ENCOUNTER — Other Ambulatory Visit (HOSPITAL_COMMUNITY): Payer: Self-pay | Admitting: *Deleted

## 2013-07-04 ENCOUNTER — Encounter (HOSPITAL_COMMUNITY)
Admission: RE | Admit: 2013-07-04 | Discharge: 2013-07-04 | Disposition: A | Discharge status: Home / Self Care | Payer: Medicare Other | Source: Ambulatory Visit | Attending: Internal Medicine | Admitting: Internal Medicine

## 2013-07-04 DIAGNOSIS — D649 Anemia, unspecified: Secondary | ICD-10-CM | POA: Insufficient documentation

## 2013-07-04 LAB — PREPARE RBC (CROSSMATCH)

## 2013-07-07 ENCOUNTER — Encounter (HOSPITAL_COMMUNITY)
Admission: RE | Admit: 2013-07-07 | Discharge: 2013-07-07 | Disposition: A | Discharge status: Home / Self Care | Payer: Medicare Other | Source: Ambulatory Visit | Attending: Internal Medicine | Admitting: Internal Medicine

## 2013-07-07 MED ORDER — ACETAMINOPHEN 325 MG PO TABS
ORAL_TABLET | ORAL | Status: AC
  Administered 2013-07-07: 650 mg via ORAL
  Filled 2013-07-07: qty 2

## 2013-07-07 MED ORDER — ACETAMINOPHEN 325 MG PO TABS
650.0000 mg | ORAL_TABLET | Freq: Once | ORAL | Status: AC
  Administered 2013-07-07: 650 mg via ORAL

## 2013-07-08 LAB — TYPE AND SCREEN
ABO/RH(D): O POS
Antibody Screen: NEGATIVE
Unit division: 0
Unit division: 0

## 2013-07-21 ENCOUNTER — Other Ambulatory Visit: Payer: Medicare Other

## 2013-07-21 ENCOUNTER — Encounter: Payer: Self-pay | Admitting: Physician Assistant

## 2013-07-21 ENCOUNTER — Ambulatory Visit (INDEPENDENT_AMBULATORY_CARE_PROVIDER_SITE_OTHER): Payer: Medicare Other | Admitting: Physician Assistant

## 2013-07-21 ENCOUNTER — Encounter: Payer: Self-pay | Admitting: Obstetrics and Gynecology

## 2013-07-21 VITALS — BP 138/76 | HR 68 | Ht 63.0 in | Wt 208.6 lb

## 2013-07-21 DIAGNOSIS — Z7901 Long term (current) use of anticoagulants: Secondary | ICD-10-CM

## 2013-07-21 DIAGNOSIS — I776 Arteritis, unspecified: Secondary | ICD-10-CM | POA: Insufficient documentation

## 2013-07-21 DIAGNOSIS — M313 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: Secondary | ICD-10-CM

## 2013-07-21 DIAGNOSIS — D509 Iron deficiency anemia, unspecified: Secondary | ICD-10-CM

## 2013-07-21 NOTE — Progress Notes (Signed)
Case discussed with advanced extender. Agree with assessment and plans as outlined. Not clear to me that she is having GI blood loss as a contributor to her anemia. Anemia is a mixed picture. Macrocytic. Has bone marrow disorder. Iron studies questionable for iron  deficiency. Reasonable to repeat celiac serologies and obtain duodenal biopsies given question of scalloping on prior capsule endoscopy. Otherwise, return to hematology regarding management of the anemia

## 2013-07-21 NOTE — Progress Notes (Signed)
Subjective:    Patient ID: [name], female    DOB: [birth year], 62 y.o.   MRN: [id number]  HPI  [name] is a pleasant 62 year old female known to Dr. Henrene Pastor who is referred today by Dr. [name]  for evaluation of  iron deficiency anemia. Patient has multiple medical problems including history of bilateral DVTs and pulmonary embolus in 2012 for which she is on Coumadin, Wegener's granulomatosis with vasculitis, a myelodysplastic syndrome, hypertension, GERD, diverticular disease, and degenerative disc disease. Patient has undergone prior workup for iron deficiency anemia in 2009 which was negative. She had colonoscopy EGD and capsule endoscopy at that time. She was noted to have some very mild villous blunting on the capsule endoscopy. TTG was normal in 2009, she did not have small bowel biopsies. Patient then had repeat colonoscopy and EGD in March of 2013 for heme positive stool. Colonoscopy showed moderate diverticulosis of the sigmoid colon and an otherwise negative exam and EGD was negative with the exception of a 5 cm hiatal hernia. Patient has had a decrease in her hemoglobin most recently measured at 7.8. She subsequently underwent blood transfusion x2 on 07/07/2013. Repeat labs were not done. She has been on chronic iron therapy over this past year but states she had been off iron for several months and 2013. She has also had previous iron infusions and is scheduled for an iron infusion later this week. Her only complaint is fatigue She specifically denies any GI symptoms today, no melena or hematochezia no O'[name] pain or change in her bowel habits no heartburn indigestion dysphagia or abnormal pain. Weight is stable and appetite is good. He may sure done in November 2014 per Dr. preemie's office was negative. Patient is followed by hematology at [facility] and has a followup appointment there later this month. Again most recent labs on 06/26/2013 hemoglobin 7.8 hematocrit of 24.1  MCV of 103 platelets 651 and WBC of 6.6 B12 855 folate 15 serum iron 29 TIBC 259 and iron saturation of 11.     Review of Systems  Constitutional: Positive for fatigue.  HENT: Negative.   Eyes: Negative.   Respiratory: Negative.   Cardiovascular: Negative.   Gastrointestinal: Negative.   Endocrine: Negative.   Genitourinary: Negative.   Musculoskeletal: Positive for arthralgias and back pain.  Skin: Negative.   Allergic/Immunologic: Negative.   Neurological: Negative.   Hematological: Bruises/bleeds easily.  Psychiatric/Behavioral: Negative.    Outpatient Prescriptions Prior to Visit  Medication Sig Dispense Refill  . acetaminophen (TYLENOL) 325 MG tablet Take 650 mg by mouth as needed.        Marland Kitchen alendronate (FOSAMAX) 70 MG tablet Take 70 mg by mouth every 7 (seven) days. Take with a full glass of water on an empty stomach.       Marland Kitchen azaTHIOprine (IMURAN) 50 MG tablet Take 150 mg by mouth daily.       . cyclobenzaprine (FLEXERIL) 5 MG tablet Take 5 mg by mouth as needed.        . DULoxetine (CYMBALTA) 60 MG capsule Take 60 mg by mouth at bedtime.      . ergocalciferol (VITAMIN D2) 50000 UNITS capsule Take 1 capsule (50,000 Units total) by mouth once a week.  4 capsule  0  . ferrous sulfate 325 (65 FE) MG tablet Take 325 mg by mouth 2 (two) times daily.        . fish oil-omega-3 fatty acids 1000 MG capsule Take 2 g by mouth daily.      Marland Kitchen  lisinopril (PRINIVIL,ZESTRIL) 10 MG tablet Take 10 mg by mouth daily.        . Multiple Vitamin (MULTIVITAMIN) tablet Take 1 tablet by mouth daily.        . predniSONE (DELTASONE) 20 MG tablet Take 15 mg by mouth daily. 1 1/2 by mouth daily, patient is weaning off med      . traMADol (ULTRAM) 50 MG tablet Take 100 mg by mouth every 6 (six) hours as needed.        . warfarin (COUMADIN) 5 MG tablet Take 7.5 mg by mouth daily. 5mg  every thur. And sun. , 7.5 mg the other days      . amLODipine (NORVASC) 10 MG tablet Take 10 mg by mouth daily.        Marland Kitchen  esomeprazole (NEXIUM) 40 MG capsule       . sulfamethoxazole-trimethoprim (BACTRIM DS) 800-160 MG per tablet Take 1 tablet by mouth. Every Monday,Wednesday,Friday        No facility-administered medications prior to visit.   Allergies  Allergen Reactions  . Latex     Sensitive to latex, rash  . Other     Adhesive tape causes rash  . Pregabalin Swelling    Hands and feet  . Codeine     REACTION: itching   Patient Active Problem List   Diagnosis Date Noted  . Wegener's granulomatosis with vasculitis 07/21/2013  . Alveolar pneumopathy 02/11/2011  . Lung mass 12/06/2010  . HEADACHE 08/24/2010  . RASH-NONVESICULAR 08/23/2010  . CELLULITIS 08/09/2010  . ANEMIA-IRON DEFICIENCY 02/16/2010  . CARDIAC MURMUR, AORTIC 01/10/2010  . POSTMENOPAUSAL SYNDROME 03/09/2009  . HYPERLIPIDEMIA 12/07/2008  . PERNICIOUS ANEMIA 12/07/2008  . DEGENERATIVE DISC DISEASE, LUMBOSACRAL SPINE W/RADICULOPATHY 12/07/2008  . FASTING HYPERGLYCEMIA 12/07/2008  . IRON DEFICIENCY ANEMIA SECONDARY TO BLOOD LOSS 02/14/2008  . HYPERTENSION 01/01/2008  . ALLERGIC RHINITIS 12/03/2007  . GERD 12/03/2007  . PAIN IN JOINT, SITE UNSPECIFIED 12/03/2007  . SLEEP APNEA 12/03/2007  . HIATAL HERNIA 02/23/2006  . Diverticulosis of Colon (without Mention of Hemorrhage) 12/30/2002   History  Substance Use Topics  . Smoking status: Never Smoker   . Smokeless tobacco: Never Used  . Alcohol Use: Yes     Comment: RARE      family history includes Arthritis in her father; Diabetes in her maternal aunt, maternal uncle, and paternal grandfather; Hypertension in her mother; Lung cancer in her father; Neuropathy in her mother; Stroke in her father. There is no history of Colon cancer.  Objective:   Physical Exam  well-developed white female in no acute distress, pleasant blood pressure 138 or 76 pulse 68 height 5 foot 3 weight 2 await. HEENT; nontraumatic normocephalic EOMI PERRLA sclera anicteric, Supple ;no JVD, Cardiovascular;  regular rate and rhythm with S1-S2 there soft systolic murmur pulmonary clear bilaterally, Abdomen ;soft nontender nondistended bowel sounds are active there is no palpable mass or hepatosplenomegaly, Rectal; exam not done recent heme oh sure negative, Extremities ;no clubbing cyanosis or edema skin warm and dry she does have multiple bruises, Psych; mood and affect appropriate      Assessment & Plan: #51 #  #27  62 year old female with iron deficiency anemia and Hemoccult negative stool. This is in a patient with prior history of iron deficiency anemia with negative GI workup. Patient also on chronic anti-coagulation and with history of a myelodysplastic disorder and Wegener's granulomatosis with vasculitis. Iron deficiency may not be on the basis of chronic GI blood loss rather associated with her  autoimmune disease and myelodysplastic syndrome. Given recent EGD and colonoscopy unremarkable will workup for possiblilty of celiac disease . #2 diverticulosis  Plan continue oral iron, currently on ferrous sulfate 325 by mouth twice daily and scheduled for iron infusion later this week. We'll check celiac panel Schedule for EGD with Dr. Henrene Pastor to include small bowel biopsies; procedure discussed in detail with patient she is agreeable to proceed Further plans pending results of above

## 2013-07-21 NOTE — Patient Instructions (Signed)
Please go to the basement level to have your labs drawn.   You have been scheduled for an endoscopy with propofol. Please follow written instructions given to you at your visit today. If you use inhalers (even only as needed), please bring them with you on the day of your procedure. Your physician has requested that you go to www.startemmi.com and enter the access code given to you at your visit today. This web site gives a general overview about your procedure. However, you should still follow specific instructions given to you by our office regarding your preparation for the procedure.

## 2013-07-22 ENCOUNTER — Telehealth: Payer: Self-pay | Admitting: *Deleted

## 2013-07-22 ENCOUNTER — Encounter: Payer: Self-pay | Admitting: Internal Medicine

## 2013-07-22 LAB — CELIAC PANEL 10
Endomysial Screen: NEGATIVE
Gliadin IgA: 9.5 U/mL (ref <20)
Gliadin IgG: 9.1 U/mL (ref <20)
IgA: 291 mg/dL (ref 69–380)
Tissue Transglut Ab: 15.1 U/mL (ref <20)
Tissue Transglutaminase Ab, IgA: 8.7 U/mL (ref <20)

## 2013-07-22 NOTE — Telephone Encounter (Signed)
I advised patient that Dr. [name] got back to me and he wants her to hold the Coumadin on Tues, Wed, Thurs, Fri am and take the coumadin when she gets home from the procedure on Friday.  The patient repeated the instructions back to me with understanding. Noted anticoagulation letter sent to be scanned.

## 2013-07-25 ENCOUNTER — Encounter: Payer: Self-pay | Admitting: Internal Medicine

## 2013-07-25 ENCOUNTER — Ambulatory Visit (AMBULATORY_SURGERY_CENTER): Payer: Medicare Other | Admitting: Internal Medicine

## 2013-07-25 VITALS — BP 135/78 | HR 73 | Temp 98.4°F | Resp 20 | Ht 63.0 in | Wt 208.0 lb

## 2013-07-25 DIAGNOSIS — D509 Iron deficiency anemia, unspecified: Secondary | ICD-10-CM

## 2013-07-25 DIAGNOSIS — K449 Diaphragmatic hernia without obstruction or gangrene: Secondary | ICD-10-CM

## 2013-07-25 DIAGNOSIS — D1330 Benign neoplasm of unspecified part of small intestine: Secondary | ICD-10-CM

## 2013-07-25 MED ORDER — SODIUM CHLORIDE 0.9 % IV SOLN: 500.0000 mL | INTRAVENOUS | Status: DC

## 2013-07-25 NOTE — Progress Notes (Signed)
Called to room to assist during endoscopic procedure.  Patient ID and intended procedure confirmed with present staff. Received instructions for my participation in the procedure from the performing physician.  

## 2013-07-25 NOTE — Patient Instructions (Signed)
YOU HAD AN ENDOSCOPIC PROCEDURE TODAY AT THE [facility]: Refer to the procedure report that was given to you for any specific questions about what was found during the examination.  If the procedure report does not answer your questions, please call your gastroenterologist to clarify.  If you requested that your care partner not be given the details of your procedure findings, then the procedure report has been included in a sealed envelope for you to review at your convenience later.  YOU SHOULD EXPECT: Some feelings of bloating in the abdomen. Passage of more gas than usual.  Walking can help get rid of the air that was put into your GI tract during the procedure and reduce the bloating. If you had a lower endoscopy (such as a colonoscopy or flexible sigmoidoscopy) you may notice spotting of blood in your stool or on the toilet paper. If you underwent a bowel prep for your procedure, then you may not have a normal bowel movement for a few days.  DIET: Your first meal following the procedure should be a light meal and then it is ok to progress to your normal diet.  A half-sandwich or bowl of soup is an example of a good first meal.  Heavy or fried foods are harder to digest and may make you feel nauseous or bloated.  Likewise meals heavy in dairy and vegetables can cause extra gas to form and this can also increase the bloating.  Drink plenty of fluids but you should avoid alcoholic beverages for 24 hours.  ACTIVITY: Your care partner should take you home directly after the procedure.  You should plan to take it easy, moving slowly for the rest of the day.  You can resume normal activity the day after the procedure however you should NOT DRIVE or use heavy machinery for 24 hours (because of the sedation medicines used during the test).    SYMPTOMS TO REPORT IMMEDIATELY: A gastroenterologist can be reached at any hour.  During normal business hours, 8:30 AM to 5:00 PM Monday through Friday,  call [phone].  After hours and on weekends, please call the GI answering service at [phone] who will take a message and have the physician on call contact you.   Following upper endoscopy (EGD)  Vomiting of blood or coffee ground material  New chest pain or pain under the shoulder blades  Painful or persistently difficult swallowing  New shortness of breath  Fever of 100F or higher  Black, tarry-looking stools  FOLLOW UP: If any biopsies were taken you will be contacted by phone or by letter within the next 1-3 weeks.  Call your gastroenterologist if you have not heard about the biopsies in 3 weeks.  Our staff will call the home number listed on your records the next business day following your procedure to check on you and address any questions or concerns that you may have at that time regarding the information given to you following your procedure. This is a courtesy call and so if there is no answer at the home number and we have not heard from you through the emergency physician on call, we will assume that you have returned to your regular daily activities without incident.  SIGNATURES/CONFIDENTIALITY: You and/or your care partner have signed paperwork which will be entered into your electronic medical record.  These signatures attest to the fact that that the information above on your After Visit Summary has been reviewed and is understood.  Full responsibility   of the confidentiality of this discharge information lies with you and/or your care-partner.  Recommendations See procedure report 

## 2013-07-25 NOTE — Progress Notes (Signed)
Patient did not have preoperative order for IV antibiotic SSI prophylaxis. (G8918)  Patient did not experience any of the following events: a burn prior to discharge; a fall within the facility; wrong site/side/patient/procedure/implant event; or a hospital transfer or hospital admission upon discharge from the facility. (G8907)  

## 2013-07-25 NOTE — Op Note (Signed)
[place]  [address] [place], [id number]   ENDOSCOPY PROCEDURE REPORT  PATIENT: Joyce, Kaufman.  MR#: [id number] BIRTHDATE: [birth year] , 62  yrs. old GENDER: Female ENDOSCOPIST: Eustace Quail, MD REFERRED BY:  Crist Infante, M.D. PROCEDURE DATE:  07/25/2013 PROCEDURE:  EGD w/ biopsy ASA CLASS:     Class III INDICATIONS:  Iron deficiency anemia. MEDICATIONS: MAC sedation, administered by CRNA and propofol (Diprivan) 150mg  IV TOPICAL ANESTHETIC: Cetacaine Spray  DESCRIPTION OF PROCEDURE: After the risks benefits and alternatives of the procedure were thoroughly explained, informed consent was obtained.  The [id number] endoscope was introduced through the mouth and advanced to the third portion of the duodenum. Without limitations.  The instrument was slowly withdrawn as the mucosa was fully examined.    EXAM:The esophagus revealed mild stricturing without inflammation of the distal esophagus at the gastroesophageal junction.  The stomach was remarkable for a 7 cm sliding hiatal hernia with associated Cameron erosions.  The remainder of the stomach was normal.  The duodenum was normal.  Duodenal biopsies taken to rule out sprue. Retroflexed views revealed a hiatal hernia.     The scope was then withdrawn from the patient and the procedure completed.  COMPLICATIONS: There were no complications. ENDOSCOPIC IMPRESSION: 1. Incidental benign distal esophageal stricture 2. Moderately large sliding hiatal hernia with associated Cameron erosions. Common cause for iron deficiency anemia. Problem exacerbated by chronic anticoagulation. 3. Multifactorial anemia  RECOMMENDATIONS: 1.  Resume Coumadin today 2.  Await biopsy results 3. Followup in the office with Dr. Henrene Pastor next month to discuss findings and treatment strategies  REPEAT EXAM:  eSigned:  Eustace Quail, MD 07/25/2013 11:24 AM   [id number] [name], MD and The Patient

## 2013-07-28 ENCOUNTER — Telehealth: Payer: Self-pay | Admitting: *Deleted

## 2013-07-28 NOTE — Telephone Encounter (Signed)
Left message that we called for f/u 

## 2013-07-29 ENCOUNTER — Encounter: Payer: Self-pay | Admitting: Internal Medicine

## 2013-08-26 ENCOUNTER — Encounter: Payer: Self-pay | Admitting: Internal Medicine

## 2013-08-26 ENCOUNTER — Ambulatory Visit (INDEPENDENT_AMBULATORY_CARE_PROVIDER_SITE_OTHER): Payer: Medicare Other | Admitting: Internal Medicine

## 2013-08-26 VITALS — BP 108/62 | HR 84 | Ht 63.0 in | Wt 214.6 lb

## 2013-08-26 DIAGNOSIS — D509 Iron deficiency anemia, unspecified: Secondary | ICD-10-CM

## 2013-08-26 DIAGNOSIS — Z7901 Long term (current) use of anticoagulants: Secondary | ICD-10-CM

## 2013-08-26 DIAGNOSIS — K449 Diaphragmatic hernia without obstruction or gangrene: Secondary | ICD-10-CM

## 2013-08-26 DIAGNOSIS — R195 Other fecal abnormalities: Secondary | ICD-10-CM

## 2013-08-26 NOTE — Patient Instructions (Addendum)
Please follow up with Dr. Henrene Pastor as needed

## 2013-08-26 NOTE — Progress Notes (Signed)
HISTORY OF PRESENT ILLNESS:  [name] is a 63 y.o. female with multiple medical problems who has been evaluated in this office for iron deficiency anemia and Hemoccult-positive stool. She also has chronic anemia with documented myelodysplastic disorder of the bone marrow. She was seen in the office in early December for worsening anemia and Hemoccult-positive stool. She subsequently underwent upper endoscopy and was found to have a moderately large sliding hiatal hernia with Lysbeth Galas erosions. Biopsies for celiac disease returned normal. She continues on chronic iron supplementation with intermittent iron infusions. She is followed at [facility] for her bone marrow disorder. Other medical problems include Wegener's granulomatosis for which she is on immunosuppressive therapy and DVT with pulmonary embolus for which she is on chronic Coumadin.  REVIEW OF SYSTEMS:  All non-GI ROS negative except for arthritis, back pain, fatigue, hearing problems, voice change  Past Medical History  Diagnosis Date   Hypertension    Anemia     recurrent iron defic.   OSA (obstructive sleep apnea)    Allergic rhinitis    GERD (gastroesophageal reflux disease)    Hypertension    Osteoarthritis    DDD (degenerative disc disease)    Depression     Dr. Toy Care (situational, divorce, loss of parents)   Diverticulosis    Hyperlipidemia    Hiatal hernia    Esophageal stricture    Wegner's disease (congenital syphilitic osteochondritis) 2012    DR. Avier Jech Dye at [facility]   Neuropathy, peripheral    DVT (deep venous thrombosis)     both legs   Pulmonary embolism     left lung    Past Surgical History  Procedure Laterality Date   Lung biospy  may 2012   Bronchoscopy  april 2012    Social History [name]  reports that she has never smoked. She has never used smokeless tobacco. She reports that she drinks alcohol. She reports that she does not use illicit drugs.  family history  includes Arthritis in her father; Diabetes in her maternal aunt, maternal grandfather, and maternal uncle; Hypertension in her mother; Lung cancer in her father; Neuropathy in her mother; Stroke in her father. There is no history of Colon cancer.  Allergies  Allergen Reactions   Latex     Sensitive to latex, rash   Other     Adhesive tape causes rash   Pregabalin Swelling    Hands and feet   Codeine     REACTION: itching       PHYSICAL EXAMINATION: Vital signs: BP 108/62   Pulse 84   Ht 5\' 3"  (1.6 m)   Wt 214 lb 9.6 oz (97.342 kg)   BMI 38.02 kg/m2 General: Well-developed, well-nourished, no acute distress HEENT: Sclerae are anicteric, conjunctiva pink. Oral mucosa intact Lungs: Clear Heart: Regular Abdomen: soft, obese, nontender, nondistended, no obvious ascites, no peritoneal signs, normal bowel sounds. No organomegaly. Extremities: No edema Psychiatric: alert and oriented x3. Cooperative   ASSESSMENT:  #1. Anemia. Multifactorial. GI contributions from hiatal hernia associated erosions (Cameron erosions). GI blood loss accelerated by chronic anticoagulation. Also, myelodysplastic syndrome. #2. Multiple medical problems   PLAN:  #1. Continue chronic iron replacement therapy. #2. Continue management of bone marrow disease per hematology. #3. For refractory anemia, may need periodic transfusions. I would be a little reluctant to recommend you have a hernia repair not being certain that resolution of Cameron erosions would result in significant sustained improvement in blood counts, given her bone marrow disease. #4.  GI followup as needed

## 2013-09-15 ENCOUNTER — Ambulatory Visit: Payer: Self-pay | Admitting: Obstetrics and Gynecology

## 2013-09-15 ENCOUNTER — Other Ambulatory Visit: Payer: Self-pay | Admitting: Dermatology

## 2013-09-23 ENCOUNTER — Encounter: Payer: Self-pay | Admitting: Obstetrics and Gynecology

## 2013-09-29 ENCOUNTER — Encounter (HOSPITAL_BASED_OUTPATIENT_CLINIC_OR_DEPARTMENT_OTHER): Payer: Medicare Other | Attending: General Surgery

## 2013-09-29 ENCOUNTER — Ambulatory Visit (INDEPENDENT_AMBULATORY_CARE_PROVIDER_SITE_OTHER): Payer: Medicare Other | Admitting: Obstetrics and Gynecology

## 2013-09-29 ENCOUNTER — Encounter: Payer: Self-pay | Admitting: Obstetrics and Gynecology

## 2013-09-29 VITALS — BP 120/68 | HR 68 | Resp 25 | Ht 63.0 in | Wt 210.6 lb

## 2013-09-29 DIAGNOSIS — IMO0002 Reserved for concepts with insufficient information to code with codable children: Secondary | ICD-10-CM | POA: Insufficient documentation

## 2013-09-29 DIAGNOSIS — Z79899 Other long term (current) drug therapy: Secondary | ICD-10-CM | POA: Insufficient documentation

## 2013-09-29 DIAGNOSIS — M313 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: Secondary | ICD-10-CM | POA: Insufficient documentation

## 2013-09-29 DIAGNOSIS — Z7901 Long term (current) use of anticoagulants: Secondary | ICD-10-CM | POA: Insufficient documentation

## 2013-09-29 DIAGNOSIS — I4891 Unspecified atrial fibrillation: Secondary | ICD-10-CM | POA: Insufficient documentation

## 2013-09-29 DIAGNOSIS — Z01419 Encounter for gynecological examination (general) (routine) without abnormal findings: Secondary | ICD-10-CM

## 2013-09-29 DIAGNOSIS — L97209 Non-pressure chronic ulcer of unspecified calf with unspecified severity: Secondary | ICD-10-CM | POA: Insufficient documentation

## 2013-09-29 DIAGNOSIS — I872 Venous insufficiency (chronic) (peripheral): Secondary | ICD-10-CM | POA: Insufficient documentation

## 2013-09-29 NOTE — Progress Notes (Signed)
GYNECOLOGY VISIT  PCP:  Ileana Ladd, MD  Referring provider:   HPI: 63 y.o.   Divorced  Caucasian  female   [place] with Patient's last menstrual period was 12/20/2002.   here for  AEX   Has problems with disc slipping in L4 and L5.  Seeing someone from [facility].   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  Taking Actonel monthly with Dr. Letta Median.   Hgb: PCP  Urine: PCP   GYNECOLOGIC HISTORY: Patient's last menstrual period was 12/20/2002. Sexually active:  Not currently Partner preference: female Contraception:  postmenopausal  Menopausal hormone therapy: none DES exposure:  none  Blood transfusions: iron infusions for chronic anemia    Sexually transmitted diseases:  none GYN Procedures:  none Mammogram: 09/09/12 normal-has appointment coming up for [place].                Pap: 09/05/11 WNL/negative HR HPV   History of abnormal pap smear: none    OB History   Grav Para Term Preterm Abortions TAB SAB Ect Mult Living   0 0 0 0 0 0 0 0 0 0        LIFESTYLE: Exercise: walking              Tobacco:none  Alcohol: rare Drug use: none   OTHER HEALTH MAINTENANCE: Tetanus/TDap:2013 Gardisil:none Influenza: 2014  Zostavax: yes have had this  Bone density: 02/15/12 - osteopenia.  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   Alveolar pneumopathy 02/11/2011   Lung mass 12/06/2010   HEADACHE 08/24/2010   RASH-NONVESICULAR 08/23/2010   CELLULITIS 08/09/2010   ANEMIA-IRON DEFICIENCY  02/16/2010   CARDIAC MURMUR, AORTIC 01/10/2010   POSTMENOPAUSAL SYNDROME 03/09/2009   HYPERLIPIDEMIA 12/07/2008   PERNICIOUS ANEMIA 12/07/2008   DEGENERATIVE DISC DISEASE, LUMBOSACRAL SPINE W/RADICULOPATHY 12/07/2008   FASTING HYPERGLYCEMIA 12/07/2008   IRON DEFICIENCY ANEMIA SECONDARY TO BLOOD LOSS 02/14/2008   HYPERTENSION 01/01/2008   ALLERGIC RHINITIS 12/03/2007   GERD 12/03/2007   PAIN IN JOINT, SITE UNSPECIFIED 12/03/2007   SLEEP APNEA 12/03/2007   HIATAL HERNIA 02/23/2006   Diverticulosis of Colon (without Mention of Hemorrhage) 12/30/2002   Past Medical History  Diagnosis Date   Hypertension    Anemia     recurrent iron defic.   OSA (obstructive sleep apnea)    Allergic rhinitis    GERD (gastroesophageal reflux disease)    Hypertension    Osteoarthritis    DDD (degenerative disc disease)    Depression     Dr. Toy Care (situational, divorce, loss of parents)   Diverticulosis    Hyperlipidemia    Hiatal hernia    Esophageal stricture    Wegner's disease (congenital syphilitic osteochondritis) 2012    [facility]   Neuropathy, peripheral    DVT (deep venous thrombosis)     both legs   Pulmonary embolism     left lung   Cervical polyp 03/2003    Past Surgical History  Procedure Laterality  Date   Lung biospy  may 2012   Bronchoscopy  april 2012   Back injection      steroid injection x2   Iron infusion  11/14    seeing hematologist at [place]: Latex; Other; Pregabalin; and Codeine  Current Outpatient Prescriptions  Medication Sig Dispense Refill   acetaminophen (TYLENOL) 325 MG tablet Take 650 mg by mouth as needed.         azaTHIOprine (IMURAN) 50 MG tablet Take 150 mg by mouth daily.        Cholecalciferol (VITAMIN D PO) Take by mouth daily.       cyclobenzaprine (FLEXERIL) 5 MG tablet Take 5 mg by mouth as needed.         DULoxetine (CYMBALTA) 60 MG capsule Take 60 mg by mouth at bedtime.        ferrous sulfate 325 (65 FE) MG tablet Take 325 mg by mouth 2 (two) times daily.         fish oil-omega-3 fatty acids 1000 MG capsule Take 2 g by mouth daily.       hydrochlorothiazide (MICROZIDE) 12.5 MG capsule Take 12.5 mg by mouth daily.        hydroxyurea (HYDREA) 500 MG capsule Take 500 mg by mouth daily.        lisinopril (PRINIVIL,ZESTRIL) 10 MG tablet Take 10 mg by mouth daily.         metoprolol succinate (TOPROL-XL) 25 MG 24 hr tablet Take 12.5 mg by mouth daily.        Multiple Vitamin (MULTIVITAMIN) tablet Take 1 tablet by mouth daily.         predniSONE (DELTASONE) 5 MG tablet Take 10 mg by mouth daily with breakfast.       traMADol (ULTRAM) 50 MG tablet Take 100 mg by mouth every 6 (six) hours as needed.         warfarin (COUMADIN) 7.5 MG tablet Take 7.5 mg by mouth daily.       doxycycline (VIBRAMYCIN) 100 MG capsule Will finish this medication today 09/29/13       risedronate (ACTONEL) 150 MG tablet 150 mg every 30 (thirty) days.       No current facility-administered medications for this visit.     ROS:  Pertinent items are noted in HPI.  SOCIAL HISTORY:  On disability.   PHYSICAL EXAMINATION:    LMP 12/20/2002   Wt Readings from Last 3 Encounters:  08/26/13 214 lb 9.6 oz (97.342 kg)  07/25/13 208 lb (94.348 kg)  07/21/13 208 lb 9.6 oz (94.62 kg)     Ht Readings from Last 3 Encounters:  08/26/13 5\' 3"  (1.6 m)  07/25/13 5\' 3"  (1.6 m)  07/21/13 5\' 3"  (1.6 m)    General appearance: alert, cooperative and appears stated age Head: Normocephalic, without obvious abnormality, atraumatic Neck: no adenopathy, supple, symmetrical, trachea midline and thyroid not enlarged, symmetric, no tenderness/mass/nodules Lungs: clear to auscultation bilaterally Breasts: Inspection negative, No nipple retraction or dimpling, No nipple discharge or bleeding, No axillary or supraclavicular adenopathy, Normal to palpation without dominant masses Heart: regular rate and  rhythm Abdomen:central obesity,  soft, non-tender; no masses,  no organomegaly Extremities: extremities normal, atraumatic, no cyanosis or edema Skin: Skin color, texture, turgor normal. Ecchymoses and scattered ulcers noted. Lymph nodes: Cervical, supraclavicular, and axillary nodes normal. No abnormal inguinal nodes palpated Neurologic: Grossly normal  Pelvic: External genitalia:  no lesions  Urethra:  normal appearing urethra with no masses, tenderness or lesions              Bartholins and Skenes: normal                 Vagina: normal appearing vagina with normal color and discharge, no lesions, signs of atrophy noted with erythema of vaginal mucosa and yellow/orange discharge.              Cervix: normal appearance              Pap and high risk HPV testing done: no.            Bimanual Exam:  Uterus:  uterus is normal size, shape, consistency and nontender.  Exam limited by body habitus.                                       Adnexa: normal adnexa in size, nontender and no masses                                      Rectovaginal: Confirms                                      Anus:  normal sphincter tone, no lesions  ASSESSMENT  Normal gynecologic exam. Vaginal atrophic changes.  Asymptomatic.  History of DVT and PE.  Not good estrogen candidate.   PLAN  Mammogram yearly.  Pap smear and high risk HPV testing in 2018 Labs, bone density , and Actonel with PCP. Return annually or prn   An After Visit Summary was printed and given to the patient.

## 2013-09-30 NOTE — Progress Notes (Signed)
[facility]  NAME:  GEORGA, STYS NO.:  [id number]  MEDICAL RECORD NO.:  [id number]      DATE OF BIRTH:  [birth year]  PHYSICIAN:  Judene Companion, M.D.           VISIT DATE:                                  OFFICE VISIT   This is a 63 year old, moderately obese female, who comes to [place] after traumatizing the lateral side of both of her calves that has resulted in a what looks like venous stasis ulcers.  This lady has a very complicated involved medical history.  She has the autoimmune disease called Wegener's syndrome, which has caused her many problems including arthritic problems, vascular problems, bleeding from her spleen which required some sort of intravenous procedure to cause cessation of the bleeding.  She is a lady, who is on many medicines including Coumadin for atrial fibrillation.  She is also on prednisone 20 mg a day.  She is also on Fosamax, metoprolol, hydrochlorothiazide, Cymbalta, lisinopril, vitamins, Hydrea because of the Wagner's syndrome, tramadol for pain and she takes vitamin D two pills a day.  Imuran, she takes three tablets a day.  These areas do not have any necrosis.  I am going to treat them with some compression and silver alginate dressings and we will follow her carefully and see what develops with her early treatments, so her diagnosis is traumatic wounds to both of her legs.  She also has venous ulcers and inflammation.  Other diagnoses are Wagner's syndrome, hypertension, arthritis, and a history of a pulmonary embolus and deep venous thrombosis.     Judene Companion, M.D.     PP/MEDQ  D:  09/29/2013  T:  09/30/2013  Job:  [id number]

## 2013-10-03 NOTE — Procedures (Signed)
°

## 2013-10-20 ENCOUNTER — Encounter (HOSPITAL_BASED_OUTPATIENT_CLINIC_OR_DEPARTMENT_OTHER): Payer: Medicare Other | Attending: General Surgery

## 2013-10-20 DIAGNOSIS — L97809 Non-pressure chronic ulcer of other part of unspecified lower leg with unspecified severity: Secondary | ICD-10-CM | POA: Insufficient documentation

## 2013-10-20 DIAGNOSIS — IMO0002 Reserved for concepts with insufficient information to code with codable children: Secondary | ICD-10-CM | POA: Insufficient documentation

## 2013-10-20 DIAGNOSIS — Z7901 Long term (current) use of anticoagulants: Secondary | ICD-10-CM | POA: Insufficient documentation

## 2013-10-21 NOTE — Progress Notes (Signed)
[facility]  NAME:  [name], [name]                 ACCOUNT NO.:  [id number]  MEDICAL RECORD NO.:  [id number]      DATE OF BIRTH:  04/05/1951  PHYSICIAN:  Theodoro Kos, DO       VISIT DATE:  10/20/2013                                  OFFICE VISIT   The patient is a 63 year old female, who is here for followup on her bilateral lower extremity ulcers.  She has been using compression and some collagen on the wounds on the lower portion of both of her legs. They seem to be getting a little bit better, but she still has a fair bit of swelling.  PAST MEDICAL HISTORY:  Positive for hypertension, anemia, gastroesophageal reflux, Wagner disease, DVT, pulmonary embolism, and diverticulosis.  PAST SURGICAL HISTORY:  She has had a lung biopsy and bronchoscopy.  ALLERGIES:  LATEX, ADHESIVE TAPE, CODEINE, PREGABALIN.  MEDICATIONS:  Include Tylenol, Imuran, Flexeril, Cymbalta, vitamin D, iron, Microzide, Hydrea, Prinivil, Toprol, multivitamin, Deltasone, Ultram, Coumadin.  SOCIAL HISTORY:  Lives at home and does have some help.  She is not smoking.  She has a primary doctor, Dr. [name], she sees on a regular basis and gets her Coumadin checked.  REVIEW OF SYSTEMS:  Otherwise negative.  PHYSICAL EXAMINATION:  She is alert, oriented, cooperative, and pleasant.  She seems to understand her condition and is willing to do which she needs to stay healthy.  She does have her compressions on today, stockings on the lower extremities.  The wounds are on the slight lateral aspect of both legs.  She has got quite a bit of venous insufficiency and lymphedema.  Her pulses are strong and regular.  Her abdomen is soft and nontender.  Her breathing is unlabored.  Her heart rate is regular.  RECOMMENDATION:  The recommendation is to continue with compression. Start her on vitamin A, multivitamin, vitamin C, zinc, elevation, protein, check a prealbumin, and will continue with the  collagen.     Theodoro Kos, DO     CS/MEDQ  D:  10/20/2013  T:  10/21/2013  Job:  [id number]

## 2013-10-28 NOTE — Progress Notes (Signed)
[facility]  NAME:  [name], [name]                 ACCOUNT NO.:  [id number]  MEDICAL RECORD NO.:  [id number]      DATE OF BIRTH:  1951/05/11  PHYSICIAN:  Irene Limbo, MD    VISIT DATE:  10/27/2013                                  OFFICE VISIT   The patient is here for followup of bilateral lower extremity ulcers in the setting of venous insufficiency.  The patient has a history of Wegener's disease and is on prednisone as well as Coumadin.  She presents with some increased pain over the site of her wound since her last visit.  Her current wound care has been Prisma of her bilateral lower extremities.  She also had a prealbumin ordered at last visit, and there is no result available from this today.  Review of chart indicates ABI on the right side 1.26 and on the left of 1.23.  PHYSICAL EXAMINATION:  Blood pressure is 139/75, pulse is 97, temperature is 98.1.   The right lateral lower extremity open wound is measured as 1.3 x 1.5 x 0.1 cm.  The left lateral lower extremity is completely epithelialized.  No leg circumferences were obtained.  She has a palpable dorsalis pedis bilaterally.  After application of topical anesthetic, selective debridement was performed over the right lateral extremity with the curette to remove all the superficial slough.  The patient tolerated procedure well.    ASSESSMENT AND PLAN:Continue with Collagen over the right lower extremity. We also provided prescription for Keflex for five days' time for some minor erythema that was centered around the wound.  Given her relative immunosuppression from her chronic prednisone, we will treat with Keflex at this time.  She was instructed to seek additional care if the erythema increased.  She also obtained vitamin A per Dr. Leafy Ro recommendations given her chronic steroid use.          ______________________________ Irene Limbo, MD     BT/MEDQ  D:  10/27/2013  T:  10/28/2013   Job:  [id number]

## 2013-10-29 ENCOUNTER — Other Ambulatory Visit: Payer: Self-pay | Admitting: Dermatology

## 2013-11-04 NOTE — Progress Notes (Signed)
[facility]  NAME:  [name], [name]                 ACCOUNT NO.:  [id number]  MEDICAL RECORD NO.:  [id number]      DATE OF BIRTH:  07-26-1951  PHYSICIAN:  Irene Limbo, MD    VISIT DATE:  11/03/2013                                  OFFICE VISIT   CHIEF COMPLAINT:  Right lower extremity ulceration.  HISTORY OF PRESENT ILLNESS:  The patient is a 63 year old female who is here for followup of right lower extremity ulceration.  She reports that prior to presentation, she hit her right lower extremity on car door, however, this wound has been prolonged secondary to underlying venous stasis.  The patient has a history of DVT with pulmonary embolus.  She is on chronic anticoagulation with Coumadin.  She also has a history of Wegener's disease and is on prednisone.  Since this wound has developed, she has not had a repeat duplex and reflux study of her lower extremity. At her last visit, we noted her left lower extremity had healed.  We will continue with collagen and Mepilex to the right lower extremity. She has had minimal change over the last 2 weeks in the wound size.  She is using her own compression stockings, however, it appears that several of these are a few years old.  She does have a pair that is new, that is over 30 mmHg compression.  I have recommended that she use these over her right lower extremity to gain adequate compression.  PHYSICAL EXAMINATION:  No vitals were documented at this visit.  Height is 5 feet 3 inches, weight is 210 pounds.  Her right lower extremity ulcer is measured as 1.5 x 1.5 x 0.1 cm.  There is slough present in the wound.  This is largely unchanged since her last visit.  After application of topical anesthetic, selective debridement was performed to remove all the superficial slough.  We will continue with the current wound care and plan for application Oasis. This is now chronic right lower extremity wound that has demonstrated  minimal progress with local wound care with collagen and silver alginate for over 4 weeks.  She will continue with her 30-40 compression wraps and we will obtain a duplex and reflex study of her lower extremity.          ______________________________ Irene Limbo, MD MBA     BT/MEDQ  D:  11/03/2013  T:  11/04/2013  Job:  [id number]

## 2013-11-05 ENCOUNTER — Other Ambulatory Visit (HOSPITAL_COMMUNITY): Payer: Self-pay | Admitting: Plastic Surgery

## 2013-11-05 DIAGNOSIS — I872 Venous insufficiency (chronic) (peripheral): Secondary | ICD-10-CM

## 2013-11-07 ENCOUNTER — Ambulatory Visit (HOSPITAL_COMMUNITY)
Admission: RE | Admit: 2013-11-07 | Discharge: 2013-11-07 | Disposition: A | Discharge status: Home / Self Care | Payer: Medicare Other | Source: Ambulatory Visit | Attending: Internal Medicine | Admitting: Internal Medicine

## 2013-11-07 DIAGNOSIS — M7989 Other specified soft tissue disorders: Secondary | ICD-10-CM

## 2013-11-07 DIAGNOSIS — I872 Venous insufficiency (chronic) (peripheral): Secondary | ICD-10-CM | POA: Insufficient documentation

## 2013-11-07 NOTE — Progress Notes (Signed)
Venous Duplex Lower Ext. Completed. Negative for DVT, Positive for deep venous insufficiency.  Oda Cogan, BS, RDMS, RVT

## 2013-11-18 NOTE — Progress Notes (Signed)
[facility]  NAME:  [name], [name]                      ACCOUNT NO.:  MEDICAL RECORD NO.:  [id number]      DATE OF BIRTH:  1951-05-06  PHYSICIAN:  Irene Limbo, MD    VISIT DATE:  11/17/2013                                  OFFICE VISIT   SUBJECTIVE:  [name] is here for followup of a right lower extremity ulceration.  The patient initially presented with trauma to the right lower extremity, however, this wound has been prolonged secondary to underlying venous stasis.  She has a history of a DVT with pulmonary embolus and is on chronic anticoagulation with Coumadin.  She has a history of Wegner's disease and is on prednisone. She completed a Vitamin A course. She underwent repeat study of her bilateral lower extremities.  There is no formal report available at this time.  This study was completed on March 20th at [facility], however, the sonographer did note in the chart that there was no evidence of DVT and she does have deep venous insufficiency. However,technician did not state which lower extremity was affected or both.  We will need to obtain formal report from this study.  Current wound care has been Promogran and she has been using her own compression stockings.  OBJECTIVE:  Blood pressure is 105/66, pulse is 90, temperature is 98.7.  Open wound over the right lateral lower extremity is measured as 1.1 x 1.4 x 0.1 cm.  After application of topical anesthetic, curette was used to remove all the superficial slough of the wound for selective debridement.  We will continue with collagen dressing followed by compression stockings.  She will be referred to vascular surgery for evaluation of her insufficiency.  We will need to obtain a formal report for effort due to recent duplex.  The patient expressed frustration about the lack of coordination, she has experienced namely we have no formal report.   We have also been awaiting authorization for Oasis and she  feels that this has been delayed has we still have no authorization present.  We will resend her information for Oasis and need to track down her formal ultrasound report.  PLAN:  For followup in 2 week's time.          ______________________________ Irene Limbo, MD     BT/MEDQ  D:  11/17/2013  T:  11/18/2013  Job:  [id number]

## 2013-12-01 ENCOUNTER — Encounter (HOSPITAL_BASED_OUTPATIENT_CLINIC_OR_DEPARTMENT_OTHER): Payer: Medicare Other | Attending: Plastic Surgery

## 2013-12-01 DIAGNOSIS — IMO0002 Reserved for concepts with insufficient information to code with codable children: Secondary | ICD-10-CM | POA: Insufficient documentation

## 2013-12-01 DIAGNOSIS — Z7901 Long term (current) use of anticoagulants: Secondary | ICD-10-CM | POA: Insufficient documentation

## 2013-12-01 DIAGNOSIS — L97809 Non-pressure chronic ulcer of other part of unspecified lower leg with unspecified severity: Secondary | ICD-10-CM | POA: Insufficient documentation

## 2013-12-01 DIAGNOSIS — M313 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: Secondary | ICD-10-CM | POA: Insufficient documentation

## 2013-12-02 NOTE — Progress Notes (Signed)
[facility]  NAME:  [name], [name]                      ACCOUNT NO.:  MEDICAL RECORD NO.:  [id number]      DATE OF BIRTH:  09/08/1950  PHYSICIAN:  Irene Limbo, MD         VISIT DATE:                                  OFFICE VISIT   The patient here for followup of right lower extremity venous ulceration.  Her comorbidities include chronic steroid use.  She reports that she has been recently seen by her rheumatologist at [facility] and given her improved symptoms, she is slowly tapering off of her steroids. The patient does wear compression stockings.  We will plan today is for application of Oasis.  Copy of her venous ultrasound was given to the patient today.  This does note evidence of reflux in the popliteal and posterior tibial veins.  She is agreeable to referral to Vascular Surgery for evaluation for this.  PHYSICAL EXAMINATION:  VITAL SIGNS:  Blood pressure is 103/69, pulse is 98, temperature is 98.2.  Right lateral lower extremity venous ulceration is measured at 0.8 x 1.3 x 0.1 cm.  After application of topical anesthetic, curette was used to remove all of the superficial slough.  The wound was rinsed in saline. 10 sq. cm of Oasis matrix was then applied to the wound and covered with Adaptic. This was secured to the adjacent skin using Steri-Strips.  This was covered with foam dressing. Patient tolerated procedure well.  PLAN:  For followup in 2 week's time.          ______________________________ Irene Limbo, MD MBA   BT/MEDQ  D:  12/01/2013  T:  12/02/2013  Job:  [id number]

## 2013-12-04 ENCOUNTER — Other Ambulatory Visit: Payer: Self-pay | Admitting: Specialist

## 2013-12-04 DIAGNOSIS — M545 Low back pain, unspecified: Secondary | ICD-10-CM

## 2013-12-09 ENCOUNTER — Ambulatory Visit
Admission: RE | Admit: 2013-12-09 | Discharge: 2013-12-09 | Disposition: A | Discharge status: Home / Self Care | Payer: Medicare Other | Source: Ambulatory Visit | Attending: Specialist | Admitting: Specialist

## 2013-12-09 DIAGNOSIS — M545 Low back pain, unspecified: Secondary | ICD-10-CM

## 2013-12-16 NOTE — Progress Notes (Signed)
[facility]  NAME:  [name], [name]                 ACCOUNT NO.:  [id number]  MEDICAL RECORD NO.:  [id number]      DATE OF BIRTH:  June 12, 1951  PHYSICIAN:  Irene Limbo, MD    VISIT DATE:  12/15/2013                                  OFFICE VISIT   CHIEF COMPLAINT:  Right lower extremity ulceration, chronic.  HISTORY OF PRESENT ILLNESS:  The patient is a 63 year old ambulatory female with history of Wegener disease, for which she is on chronic anticoagulation and steroids.  At her last visit, she underwent her first application of Oasis, and she has been changing only the external dressing for the last two weeks.  She has consultation with Vascular Surgery in 2 weeks' time as her venous ultrasound showed evidence of the reflux in the popliteal and posterior tibial veins.  Since her last visit here, she has also had MRI done and evaluation by orthopedic surgeon for spondylolisthesis.  She is obtaining a second opinion from Neurosurgery.  PHYSICAL EXAMINATION:  Blood pressure is 123/75, pulse is 92, temperature is 98.4.  Right calf circumference is 39.5 cm.  Right ankle is 24 cm.  Open wound over right lower extremity is 0.8 x 1.2 x 0.1 cm. This is minimally contracted since her last visit.  After application of topical anesthetic, selective debridement was performed with curette to remove all the superficial slough.  10 cm2 of Oasis was applied to the wound bed and dressed with Adaptic.  This was secured with Steri-Strips to the adjacent skin and covered with foam dressing.  We will plan to continue with changing the external dressing alone until her next followup in 2 weeks' time.  The patient had very cogent questions regarding whether she would be able to undergo spine surgery with open wound present.  I counseled her that she needs to show her surgeons her wound at the time of followup to determine whether this would prohibit any intervention.           ______________________________ Irene Limbo, MD MBA     BT/MEDQ  D:  12/15/2013  T:  12/16/2013  Job:  [id number]

## 2013-12-29 ENCOUNTER — Encounter (HOSPITAL_BASED_OUTPATIENT_CLINIC_OR_DEPARTMENT_OTHER): Payer: Medicare Other | Attending: Plastic Surgery

## 2013-12-29 DIAGNOSIS — L97409 Non-pressure chronic ulcer of unspecified heel and midfoot with unspecified severity: Secondary | ICD-10-CM | POA: Insufficient documentation

## 2013-12-29 DIAGNOSIS — I739 Peripheral vascular disease, unspecified: Secondary | ICD-10-CM | POA: Insufficient documentation

## 2013-12-29 DIAGNOSIS — L89609 Pressure ulcer of unspecified heel, unspecified stage: Secondary | ICD-10-CM | POA: Insufficient documentation

## 2013-12-29 DIAGNOSIS — L8992 Pressure ulcer of unspecified site, stage 2: Secondary | ICD-10-CM | POA: Insufficient documentation

## 2013-12-29 DIAGNOSIS — L89309 Pressure ulcer of unspecified buttock, unspecified stage: Secondary | ICD-10-CM | POA: Insufficient documentation

## 2013-12-29 DIAGNOSIS — E1169 Type 2 diabetes mellitus with other specified complication: Secondary | ICD-10-CM | POA: Insufficient documentation

## 2013-12-30 NOTE — Progress Notes (Signed)
[facility]  NAME:  [name], [name]                      ACCOUNT NO.:  MEDICAL RECORD NO.:  [id number]      DATE OF BIRTH:  04-02-1951  PHYSICIAN:  Irene Limbo, MD    VISIT DATE:  12/29/2013                                  OFFICE VISIT   CHIEF COMPLAINT: 1. Pressure ulceration of buttock. 2. Ulceration of left heel in a setting of diabetes mellitus and     pressure. 3. Threatened ulceration of right heel. 4. Severe peripheral vascular disease.  HISTORY OF PRESENT ILLNESS:  The patient is a 63 year old female that is residing in [facility].  She presents for followup of the above wounds.  At her last visit, we received a message from her nursing facility stating that they were unable to obtain [facility] boots and we counseled them to place any type of offloading boot.  Per the patient's report, these were ordered; however, she has not yet received them.  She presents today with new pressure ulceration at least stage I over the right heel.  Since her last visit, she has had ABIs done.  Over the left side, ABI was 0.57.  Over the right, was 0.90.  The left toe index was 0.23 and the right toe index was 0.48.  The patient is unaware of any Vascular Surgery consultation that is pending, and we will have to call to make sure that this referral has been set up.  PHYSICAL EXAMINATION:  VITAL SIGNS:  Blood pressure is 117/64, pulse is 72, temperature is 98.4  Over her right buttock, she has a stage II ulceration measured at 0.3 x 0.4 x 0.1 cm. After application of topical anesthetic, curette was used to remove all the superficial slough present.  We will continue with collagen to the area to change every other day.  Over the left lateral heel, the patient's wound is largely unchanged, measured as 1.8 x 1.5 x 0.4 cm. This would be classified as a stage II pressure or Wagner II ulceration. Scissors were used to remove all of the necrotic subcutaneous fat  present over the base of the wound, and the slough that was present.   Over her right heel, there is no open wound present; however, she has discoloration that is consistent with stage I pressure ulcer.  ASSESSMENT:  Bilateral heel pressure ulcers in the setting of diabetes mellitus and severe peripheral vascular disease.  We will continue with collagen to the left heel and place foam heel protectors over bilateral feet.  We reviewed the patient's studies, and we will plan for formal vascular surgery referral. The sacral pressure ulceration that appears to be contracting.  The patient remains nonambulatory.  Per the patient, she is not allowed out of bed because she gets dizzy.  She is also requesting an increase in her pain medication and I counseled her that she needs to discuss this with the physician that is following her at [facility].          ______________________________ Irene Limbo, MD MBA     BT/MEDQ  D:  12/29/2013  T:  12/30/2013  Job:  [id number]

## 2013-12-30 NOTE — Progress Notes (Signed)
[facility]  NAME:  [name], [name]                 ACCOUNT NO.:  [id number]  MEDICAL RECORD NO.:  [id number]      DATE OF BIRTH:  05-Sep-1950  PHYSICIAN:  Irene Limbo, MD    VISIT DATE:  12/29/2013                                  OFFICE VISIT   CHIEF COMPLAINT:  Right lower extremity ulceration.  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  She has undergone 2 applications of Oasis. She has consultation with Vascular Surgery this week regarding venous ultrasound which showed evidence of reflux in the popliteal and posterior tibial veins.  She is also seeing Neurosurgery for a second opinion of her spondylolisthesis tomorrow.  Examination over her right lower extremity: she has a completely epithelialized wound.  There is nonpitting edema present that is well controlled.  ASSESSMENT:  Healed right lower extremity wound.  PLAN:  To discharge her from clinic and instructed her to continue with her compression stockings.  Also counseled her again to have her spine surgeon examine her leg to ensure that they are okay with their appearance for any future surgery.          ______________________________ Irene Limbo, MD MBA     BT/MEDQ  D:  12/29/2013  T:  12/30/2013  Job:  [id number]

## 2014-01-01 ENCOUNTER — Encounter: Payer: Self-pay | Admitting: Vascular Surgery

## 2014-01-02 ENCOUNTER — Ambulatory Visit (INDEPENDENT_AMBULATORY_CARE_PROVIDER_SITE_OTHER): Payer: Medicare Other | Admitting: Vascular Surgery

## 2014-01-02 ENCOUNTER — Encounter: Payer: Self-pay | Admitting: Vascular Surgery

## 2014-01-02 VITALS — BP 127/82 | HR 68 | Ht 63.0 in | Wt 204.0 lb

## 2014-01-02 DIAGNOSIS — I7025 Atherosclerosis of native arteries of other extremities with ulceration: Secondary | ICD-10-CM | POA: Insufficient documentation

## 2014-01-02 DIAGNOSIS — L98499 Non-pressure chronic ulcer of skin of other sites with unspecified severity: Secondary | ICD-10-CM | POA: Insufficient documentation

## 2014-01-02 DIAGNOSIS — I872 Venous insufficiency (chronic) (peripheral): Secondary | ICD-10-CM

## 2014-01-02 DIAGNOSIS — I83893 Varicose veins of bilateral lower extremities with other complications: Secondary | ICD-10-CM

## 2014-01-02 DIAGNOSIS — L97909 Non-pressure chronic ulcer of unspecified part of unspecified lower leg with unspecified severity: Secondary | ICD-10-CM

## 2014-01-02 DIAGNOSIS — I739 Peripheral vascular disease, unspecified: Secondary | ICD-10-CM

## 2014-01-02 HISTORY — DX: Non-pressure chronic ulcer of unspecified part of unspecified lower leg with unspecified severity: L97.909

## 2014-01-02 NOTE — Progress Notes (Signed)
Referred by:  Jerlyn Ly, MD [place], [place] [id number]  Reason for referral: Poorly healing right leg  History of Present Illness  [name] is a 63 y.o. 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  the patient injuried herself in the right lateral calf Christmas 2014.  She has had difficulty healing that injury, only recently sealing that ulcer.  She also recently was scratched by her cat in the right calf.  The patient has a history mild swelling in both legs with any significant CVI sx.  The patient has had known history of BLE DVT with PE, no history of pregnancy, known history of varicose vein, known history of venous stasis ulcers, no history of  Lymphedema and known history of skin changes in lower legs.  There is known family history of venous disorders.  The patient has used knee high compression stockings in the past.  Past Medical History  Diagnosis Date  . Hypertension   . Anemia     recurrent iron defic.  Marland Kitchen OSA (obstructive sleep apnea)   . Allergic rhinitis   . GERD (gastroesophageal reflux disease)   . Hypertension   . Osteoarthritis   . DDD (degenerative disc disease)   . Depression     Dr. Toy Care (situational, divorce, loss of parents)  . Diverticulosis   . Hyperlipidemia   . Hiatal hernia   . Esophageal stricture   . Wegner's disease (congenital syphilitic osteochondritis) 2012    DR. Ginger Organ at [facility]  . Neuropathy, peripheral   . DVT (deep venous thrombosis)     both legs  . Pulmonary embolism     left lung  . Cervical polyp 03/2003    Past Surgical History  Procedure Laterality Date  . Lung biospy  may 2012  . Bronchoscopy  april 2012  . Back injection      steroid injection x2  . Iron infusion  11/14    seeing hematologist at [place]  . Marital Status: Divorced     Spouse Name: N/A    Number of Children: N  . Years of Education: N/A   Occupational History  . RN-disabled [employer]    Neonatal Intensive Care Nurse   Social History Main Topics  . Smoking status: Never Smoker   . Smokeless tobacco: Never Used  . Alcohol Use: Yes     Comment: rare  . Drug Use: No  . Sexual Activity: Not Currently    Partners: Male   Other Topics Concern  . Not on file   Social History Narrative   Regular exercise= treadmill 4x/week for 30 mins   Weight watchers.    Lives alone.  Caffeine daily-coffee    Family History  Problem Relation Age of Onset  . Hypertension Mother   . Neuropathy Mother   . Varicose Veins Mother   . Stroke Father   . Lung cancer Father   . Arthritis Father   . Cancer Father   . Deep vein thrombosis Father   . Hypertension Father   . Varicose Veins Father   . Diabetes Maternal Grandfather   . Diabetes Maternal Uncle   . Diabetes Maternal Aunt   . Colon cancer Neg Hx   . Hypertension Sister     Current Outpatient Prescriptions on File Prior to Visit  Medication Sig Dispense Refill  .  acetaminophen (TYLENOL) 325 MG tablet Take 650 mg by mouth as needed.        Marland Kitchen azaTHIOprine (IMURAN) 50 MG tablet Take 150 mg by mouth daily.       . Cholecalciferol (VITAMIN D PO) Take by mouth daily.      . cyclobenzaprine (FLEXERIL) 5 MG tablet Take 5 mg by mouth 3 (three) times daily as needed.       . doxycycline (VIBRAMYCIN) 100 MG capsule Will finish this medication today 09/29/13      . DULoxetine (CYMBALTA) 60 MG capsule Take 60 mg by mouth at bedtime.      . ferrous sulfate 325 (65 FE) MG tablet Take 325 mg by mouth 2 (two) times daily.        . fish oil-omega-3 fatty acids 1000 MG capsule Take 2 g by mouth daily.      . hydrochlorothiazide (MICROZIDE) 12.5 MG capsule Take 12.5 mg by mouth daily.       . hydroxyurea (HYDREA) 500 MG capsule Take 500 mg by mouth daily.       Marland Kitchen lisinopril (PRINIVIL,ZESTRIL) 10 MG tablet Take 10 mg  by mouth daily.        . metoprolol succinate (TOPROL-XL) 25 MG 24 hr tablet Take 12.5 mg by mouth daily.       . Multiple Vitamin (MULTIVITAMIN) tablet Take 1 tablet by mouth daily.        . predniSONE (DELTASONE) 5 MG tablet Take 7.5 mg by mouth daily with breakfast.       . risedronate (ACTONEL) 150 MG tablet 150 mg every 30 (thirty) days.      . traMADol (ULTRAM) 50 MG tablet Take 100 mg by mouth every 6 (six) hours as needed.        . warfarin (COUMADIN) 7.5 MG tablet Take 7.5 mg by mouth daily.       No current facility-administered medications on file prior to visit.    Allergies  Allergen Reactions  . Latex     Sensitive to latex, rash  . Other     Adhesive tape causes rash  . Pregabalin Swelling    Hands and feet  . Codeine     REACTION: itching    REVIEW OF SYSTEMS:  (Positives checked otherwise negative)  CARDIOVASCULAR:  []  chest pain, []  chest pressure, []  palpitations, []  shortness of breath when laying flat, []  shortness of breath with exertion,  []  pain in feet when walking, []  pain in feet when laying flat, [x]  history of blood clot in veins (DVT), []  history of phlebitis, []  swelling in legs, [x]  varicose veins  PULMONARY:  []  productive cough, []  asthma, []  wheezing  NEUROLOGIC:  [x]  weakness in arms or legs, [x]  numbness in arms or legs, []  difficulty speaking or slurred speech, []  temporary loss of vision in one eye, []  dizziness  HEMATOLOGIC:  []  bleeding problems, []  problems with blood clotting too easily  MUSCULOSKEL:  []  joint pain, []  joint swelling  GASTROINTEST:  []  vomiting blood, []  blood in stool     GENITOURINARY:  []  burning with urination, []  blood in urine  PSYCHIATRIC:  []  history of major depression  INTEGUMENTARY:  []  rashes, [x]  ulcers  CONSTITUTIONAL:  []  fever, []  chills   Physical Examination Filed Vitals:   01/02/14 0917  BP: 127/82  Pulse: 68  Height: 5\' 3"  (1.6 m)  Weight: 204 lb (92.534 kg)  SpO2: 100%   Body mass  index is 36.15 kg/(m^2).  General: A&O x 3, WD, obese  Head: [place]/AT  Ear/Nose/Throat: Hearing grossly intact, nares w/o erythema or drainage, oropharynx w/o Erythema/Exudate  Eyes: PERRLA, EOMI  Neck: Supple, no nuchal rigidity, no palpable LAD  Pulmonary: Sym exp, good air movt, CTAB, no rales, rhonchi, & wheezing  Cardiac: RRR, Nl S1, S2, no Murmurs, rubs or gallops  Vascular: Vessel Right Left  Radial Palpable Palpable  Brachial Palpable Palpable  Carotid Palpable, without bruit Palpable, without bruit  Aorta Not palpable N/A  Femoral Palpable Palpable  Popliteal Not palpable Not palpable  PT Palpable Palpable  DP Not Palpable Not Palpable   Gastrointestinal: soft, NTND, -G/R, - HSM, - masses, - CVAT B  Musculoskeletal: M/S 5/5 throughout , Extremities without ischemic changes , cyanotic feet (L>R), +mild LDS BLE, +varicosities with spider veins BLE, scratch marks L calf c/w history of pet trauma, no blanching erythema, no edema BLE  Neurologic: CN 2-12 intact , Pain and light touch intact in extremities , Motor exam as listed above  Psychiatric: Judgment intact, Mood & affect appropriate for pt's clinical situation  Dermatologic: See M/S exam for extremity exam, no rashes otherwise noted  Lymph : No Cervical, Axillary, or Inguinal lymphadenopathy    Non-Invasive Vascular Imaging  Outside BLE Venous Duplex (Date: 11/07/13):   RLE: no DVT and SVT  LLE: no DVT and SVT  Suggestion of reflux in B popliteal vein  Outside Studies/Documentation 5 pages of outside documents were reviewed including: outpatient PCP chart and outside venous duplex.  Medical Decision Making  [name] is a 63 y.o. female who presents with: BLE chronic venous insufficiency (C4), varicose veins with complications, likely mild BLE PAD, Wegner's vasculitis with steroid use   Location of wound on R leg is not consistent with venous ulcers, which are usually medial.  I suspect her  underlying Wegner's vasculitis and steroid use likely has complicated her wound healing.  She also does have some degree of venous insufficiency that is complicating her healing but currently she does not have enough edema to cause venous pathophysiology.  Based on the patient's history and examination, I recommend: BLE ABI, BLE venous insufficiency duplex.  The patient is going to have the studies completed and follow up in two-four weeks with those studies.  Thank you for allowing [place] to participate in this patient's care.  Adele Barthel, MD [facility] Office: [phone] Pager: [phone]  01/02/2014, 9:45 AM

## 2014-01-02 NOTE — Addendum Note (Signed)
Addended by: Mena Goes on: 01/02/2014 01:51 PM   Modules accepted: Orders

## 2014-01-29 ENCOUNTER — Encounter: Payer: Self-pay | Admitting: Vascular Surgery

## 2014-01-30 ENCOUNTER — Ambulatory Visit: Payer: Medicare Other | Admitting: Vascular Surgery

## 2014-01-30 ENCOUNTER — Encounter (HOSPITAL_COMMUNITY): Payer: Medicare Other

## 2014-03-12 ENCOUNTER — Encounter: Payer: Self-pay | Admitting: Vascular Surgery

## 2014-03-13 ENCOUNTER — Encounter: Payer: Self-pay | Admitting: Vascular Surgery

## 2014-03-13 ENCOUNTER — Ambulatory Visit (INDEPENDENT_AMBULATORY_CARE_PROVIDER_SITE_OTHER)
Admission: RE | Admit: 2014-03-13 | Discharge: 2014-03-13 | Disposition: A | Discharge status: Home / Self Care | Payer: Medicare Other | Source: Ambulatory Visit | Attending: Vascular Surgery | Admitting: Vascular Surgery

## 2014-03-13 ENCOUNTER — Ambulatory Visit (INDEPENDENT_AMBULATORY_CARE_PROVIDER_SITE_OTHER): Payer: Medicare Other | Admitting: Vascular Surgery

## 2014-03-13 ENCOUNTER — Ambulatory Visit (HOSPITAL_COMMUNITY)
Admission: RE | Admit: 2014-03-13 | Discharge: 2014-03-13 | Disposition: A | Discharge status: Home / Self Care | Payer: Medicare Other | Source: Ambulatory Visit | Attending: Vascular Surgery | Admitting: Vascular Surgery

## 2014-03-13 VITALS — BP 136/77 | HR 67 | Ht 63.0 in | Wt 200.0 lb

## 2014-03-13 DIAGNOSIS — I872 Venous insufficiency (chronic) (peripheral): Secondary | ICD-10-CM

## 2014-03-13 DIAGNOSIS — I83893 Varicose veins of bilateral lower extremities with other complications: Secondary | ICD-10-CM

## 2014-03-13 NOTE — Progress Notes (Signed)
    Established Venous Insufficiency  History of Present Illness  [name] is a 63 y.o. (Jul 23, 1951) female who presents with chief complaint: left hip pain.  The patient's leg wounds have healed.  The patient's symptoms are: left hip/back pain.  She is seeing spine/neurosurgery for management of that pain.  The patient has used knee high compression stockings in the past but currently is not able to use such due her left hip pain.  The patient's treatment regimen currently included: maximal medical management.  The patient's PMH, PSH, SH, FamHx, Med, and Allergies are unchanged from 01/02/14.  On ROS today: no fever or chills, no ulcers  Physical Examination  Filed Vitals:   03/13/14 1030  BP: 136/77  Pulse: 67  Height: 5\' 3"  (1.6 m)  Weight: 200 lb (90.719 kg)  SpO2: 100%   Body mass index is 35.44 kg/(m^2).  General: A&O x 3, WD, mildly obese  Pulmonary: Sym exp, good air movt, CTAB, no rales, rhonchi, & wheezing  Cardiac: RRR, Nl S1, S2, no Murmurs, rubs or gallops  Vascular: Vessel Right Left  Radial Palpable Palpable  Brachial Palpable Palpable  Carotid Palpable, without bruit Palpable, without bruit  Aorta Not palpable N/A  Femoral Palpable Palpable  Popliteal Not palpable Not palpable  PT Palpable Palpable  DP Palpable Palpable   Gastrointestinal: soft, NTND, -G/R, - HSM, - masses, - CVAT B  Musculoskeletal: M/S 5/5 throughout , Extremities without ischemic changes , no cyanosis in feet today, +mild LDS BLE, +varicosities with spider veins BLE, no edema BLE, no ulcers, scab on anterior lower shin  Neurologic: Pain and light touch intact in extremities  Motor exam as listed above  Non-Invasive Vascular Imaging  ABI (Date: 03/13/2014)  R: 1.28, DP: tri, PT: tri  L: 1.29, DP: tri, PT: tri  BLE Venous Insufficiency Duplex (Date: 03/13/2014):   RLE: no DVT and SVT, no GSV reflux, + deep venous reflux  LLE: no DVT and SVT, + GSV reflux, + deep  venous reflux  Medical Decision Making  [name] is a 63 y.o. female who presents with: B leg chronic venous insufficiency (C4), varicose veins with complications, no evidence of peripheral arterial disease   Based on the patient's vascular studies and examination, I have offered the patient: compressive therapy.  I discussed with the patient the use of her 20-30 mm thigh high compression stockings and need for 3 month trial of such.  Due to the patient's OA issues with her L hip, so suspect she is going to have issues with compliance with compressive therapy.  She will follow up with [place] if she is able to complete a 3 month trial of her comrpession stockings  Thank you for allowing [place] to participate in this patient's care.  Adele Barthel, MD [facility] [place] Office: [phone] Pager: [phone]  03/13/2014, 10:43 AM

## 2014-03-21 HISTORY — PX: PARTIAL HIP ARTHROPLASTY: SHX733

## 2014-05-03 ENCOUNTER — Emergency Department (HOSPITAL_COMMUNITY): Payer: Medicare Other

## 2014-05-03 ENCOUNTER — Emergency Department (HOSPITAL_COMMUNITY)
Admission: EM | Admit: 2014-05-03 | Discharge: 2014-05-03 | Disposition: A | Discharge status: Home / Self Care | Payer: Medicare Other | Attending: Emergency Medicine | Admitting: Emergency Medicine

## 2014-05-03 ENCOUNTER — Encounter (HOSPITAL_COMMUNITY): Payer: Self-pay | Admitting: Emergency Medicine

## 2014-05-03 DIAGNOSIS — M549 Dorsalgia, unspecified: Secondary | ICD-10-CM | POA: Diagnosis not present

## 2014-05-03 DIAGNOSIS — R079 Chest pain, unspecified: Secondary | ICD-10-CM | POA: Insufficient documentation

## 2014-05-03 DIAGNOSIS — Z792 Long term (current) use of antibiotics: Secondary | ICD-10-CM | POA: Insufficient documentation

## 2014-05-03 DIAGNOSIS — Z7901 Long term (current) use of anticoagulants: Secondary | ICD-10-CM | POA: Diagnosis not present

## 2014-05-03 DIAGNOSIS — Z86711 Personal history of pulmonary embolism: Secondary | ICD-10-CM | POA: Insufficient documentation

## 2014-05-03 DIAGNOSIS — Z8742 Personal history of other diseases of the female genital tract: Secondary | ICD-10-CM | POA: Insufficient documentation

## 2014-05-03 DIAGNOSIS — M1990 Unspecified osteoarthritis, unspecified site: Secondary | ICD-10-CM | POA: Diagnosis not present

## 2014-05-03 DIAGNOSIS — G609 Hereditary and idiopathic neuropathy, unspecified: Secondary | ICD-10-CM | POA: Diagnosis not present

## 2014-05-03 DIAGNOSIS — Z86718 Personal history of other venous thrombosis and embolism: Secondary | ICD-10-CM | POA: Diagnosis not present

## 2014-05-03 DIAGNOSIS — K449 Diaphragmatic hernia without obstruction or gangrene: Secondary | ICD-10-CM | POA: Diagnosis not present

## 2014-05-03 DIAGNOSIS — IMO0002 Reserved for concepts with insufficient information to code with codable children: Secondary | ICD-10-CM | POA: Insufficient documentation

## 2014-05-03 DIAGNOSIS — F329 Major depressive disorder, single episode, unspecified: Secondary | ICD-10-CM | POA: Insufficient documentation

## 2014-05-03 DIAGNOSIS — D649 Anemia, unspecified: Secondary | ICD-10-CM | POA: Insufficient documentation

## 2014-05-03 DIAGNOSIS — I10 Essential (primary) hypertension: Secondary | ICD-10-CM | POA: Diagnosis not present

## 2014-05-03 DIAGNOSIS — Z79899 Other long term (current) drug therapy: Secondary | ICD-10-CM | POA: Insufficient documentation

## 2014-05-03 DIAGNOSIS — Z91040 Latex allergy status: Secondary | ICD-10-CM | POA: Diagnosis not present

## 2014-05-03 DIAGNOSIS — R112 Nausea with vomiting, unspecified: Secondary | ICD-10-CM | POA: Diagnosis not present

## 2014-05-03 DIAGNOSIS — Z8619 Personal history of other infectious and parasitic diseases: Secondary | ICD-10-CM | POA: Diagnosis not present

## 2014-05-03 DIAGNOSIS — F3289 Other specified depressive episodes: Secondary | ICD-10-CM | POA: Insufficient documentation

## 2014-05-03 DIAGNOSIS — Z8719 Personal history of other diseases of the digestive system: Secondary | ICD-10-CM | POA: Diagnosis not present

## 2014-05-03 DIAGNOSIS — E785 Hyperlipidemia, unspecified: Secondary | ICD-10-CM | POA: Diagnosis not present

## 2014-05-03 DIAGNOSIS — R61 Generalized hyperhidrosis: Secondary | ICD-10-CM | POA: Diagnosis not present

## 2014-05-03 LAB — CBC
HCT: 32.1 % — ABNORMAL LOW (ref 36.0–46.0)
Hemoglobin: 10.2 g/dL — ABNORMAL LOW (ref 12.0–15.0)
MCH: 32.7 pg (ref 26.0–34.0)
MCHC: 31.8 g/dL (ref 30.0–36.0)
MCV: 102.9 fL — ABNORMAL HIGH (ref 78.0–100.0)
Platelets: >900 10*3/uL (ref 150–400)
RBC: 3.12 MIL/uL — ABNORMAL LOW (ref 3.87–5.11)
RDW: 16.6 % — ABNORMAL HIGH (ref 11.5–15.5)
WBC: 8.8 10*3/uL (ref 4.0–10.5)

## 2014-05-03 LAB — I-STAT TROPONIN, ED: Troponin i, poc: 0 ng/mL (ref 0.00–0.08)

## 2014-05-03 LAB — I-STAT CHEM 8, ED
BUN: 17 mg/dL (ref 6–23)
Calcium, Ion: 1.19 mmol/L (ref 1.13–1.30)
Chloride: 101 mEq/L (ref 96–112)
Creatinine, Ser: 0.8 mg/dL (ref 0.50–1.10)
Glucose, Bld: 168 mg/dL — ABNORMAL HIGH (ref 70–99)
HCT: 35 % — ABNORMAL LOW (ref 36.0–46.0)
Hemoglobin: 11.9 g/dL — ABNORMAL LOW (ref 12.0–15.0)
Potassium: 4.5 mEq/L (ref 3.7–5.3)
Sodium: 138 mEq/L (ref 137–147)
TCO2: 27 mmol/L (ref 0–100)

## 2014-05-03 LAB — PROTIME-INR
INR: 2.04 — ABNORMAL HIGH (ref 0.00–1.49)
Prothrombin Time: 23 seconds — ABNORMAL HIGH (ref 11.6–15.2)

## 2014-05-03 MED ORDER — IOHEXOL 350 MG/ML SOLN
100.0000 mL | Freq: Once | INTRAVENOUS | Status: AC | PRN
  Administered 2014-05-03: 100 mL via INTRAVENOUS

## 2014-05-03 MED ORDER — ESOMEPRAZOLE MAGNESIUM 20 MG PO CPDR: 20.0000 mg | DELAYED_RELEASE_CAPSULE | Freq: Every day | ORAL | Status: DC

## 2014-05-03 MED ORDER — GI COCKTAIL ~~LOC~~
30.0000 mL | Freq: Once | ORAL | Status: AC
  Administered 2014-05-03: 30 mL via ORAL
  Filled 2014-05-03: qty 30

## 2014-05-03 NOTE — ED Notes (Signed)
Pt. Reports sudden onset left shoulder pain after eating that radiates around to left chest, down left arm, and up left side of neck. Pt. Reports initally diaphoresis and nausea but that has since resolved. Denies SOB, respiratory rate high - patient attributes this to pain. Pt. Is alert and oriented x4.

## 2014-05-03 NOTE — ED Notes (Signed)
Dr. Henry Russel notified of critical lab, platelets >900.

## 2014-05-03 NOTE — ED Notes (Signed)
Per EMS, patient had sudden onset chest pain after eating this evening, radiating to left arm. Given 324 ASA and 1 nitro. Hx of afib, DVT and PE.

## 2014-05-03 NOTE — ED Provider Notes (Signed)
CSN: [id number]     Arrival date & time 05/03/14  1533 History   First MD Initiated Contact with Patient 05/03/14 1549     Chief Complaint  Patient presents with  . Chest Pain    Patient is a 63 y.o. female presenting with chest pain. The history is provided by the patient.  Chest Pain Pain location:  Unable to specify (but mostly now in left shoulder and in between shoulder blades. ) Pain radiates to:  L arm Pain radiates to the back: yes   Pain severity:  Severe Onset quality:  Sudden Timing:  Constant Progression:  Worsening (7/10) Context: eating and at rest   Context: not breathing, not lifting and no movement   Relieved by:  Nothing Worsened by:  Nothing tried Ineffective treatments:  Nitroglycerin Associated symptoms: back pain, diaphoresis, nausea and vomiting   Associated symptoms: no abdominal pain, no cough, no dizziness, no dysphagia, no fever, no headache, no heartburn, no lower extremity edema, no numbness, no orthopnea and no shortness of breath   Risk factors: high cholesterol, hypertension and prior DVT/PE   Risk factors: no coronary artery disease and no diabetes mellitus    Hx of hiatal hernia, reflux, DVT/PE, esophageal stricture.  Compliant with coumadin. NTG given by EMS did not relieve pain. ASA given en route.   Past Medical History  Diagnosis Date  . Hypertension   . Anemia     recurrent iron defic.  Marland Kitchen OSA (obstructive sleep apnea)   . Allergic rhinitis   . GERD (gastroesophageal reflux disease)   . Hypertension   . Osteoarthritis   . DDD (degenerative disc disease)   . Depression     Dr. Toy Care (situational, divorce, loss of parents)  . Diverticulosis   . Hyperlipidemia   . Hiatal hernia   . Esophageal stricture   . Wegner's disease (congenital syphilitic osteochondritis) 2012    DR. Ginger Organ at [place]  . Neuropathy, peripheral   . DVT (deep venous thrombosis)     both legs  . Pulmonary embolism     left lung  . Cervical polyp 03/2003   Past  Surgical History  Procedure Laterality Date  . Lung biospy  may 2012  . Bronchoscopy  april 2012  . Back injection      steroid injection x2  . Iron infusion  11/14    seeing hematologist at [facility] History  Problem Relation Age of Onset  . Hypertension Mother   . Neuropathy Mother   . Varicose Veins Mother   . Stroke Father   . Lung cancer Father   . Arthritis Father   . Cancer Father   . Deep vein thrombosis Father   . Hypertension Father   . Varicose Veins Father   . Diabetes Maternal Grandfather   . Diabetes Maternal Uncle   . Diabetes Maternal Aunt   . Colon cancer Neg Hx   . Hypertension Sister    History  Substance Use Topics  . Smoking status: Never Smoker   . Smokeless tobacco: Never Used  . Alcohol Use: Yes     Comment: rare   OB History   Grav Para Term Preterm Abortions TAB SAB Ect Mult Living   0 0 0 0 0 0 0 0 0 0      Review of Systems  Constitutional: Positive for diaphoresis. Negative for fever.  HENT: Negative for trouble swallowing.   Respiratory: Negative for cough and shortness of breath.   Cardiovascular: Positive  for chest pain. Negative for orthopnea.  Gastrointestinal: Positive for nausea and vomiting. Negative for heartburn and abdominal pain.  Musculoskeletal: Positive for back pain.  Neurological: Negative for dizziness, numbness and headaches.      Allergies  Latex; Other; Pregabalin; and Codeine  Home Medications   Prior to Admission medications   Medication Sig Start Date End Date Taking? Authorizing Provider  acetaminophen (TYLENOL) 325 MG tablet Take 650 mg by mouth as needed.      Historical Provider, MD  azaTHIOprine (IMURAN) 50 MG tablet Take 150 mg by mouth daily.     Historical Provider, MD  cephALEXin (KEFLEX) 500 MG capsule  10/27/13   Historical Provider, MD  Cholecalciferol (VITAMIN D PO) Take by mouth daily.    Historical Provider, MD  cyclobenzaprine (FLEXERIL) 10 MG tablet  11/10/13   Historical Provider, MD   cyclobenzaprine (FLEXERIL) 5 MG tablet Take 5 mg by mouth 3 (three) times daily as needed.     Historical Provider, MD  doxycycline (MONODOX) 100 MG capsule  03/03/14   Historical Provider, MD  doxycycline (VIBRAMYCIN) 100 MG capsule Will finish this medication today 09/29/13 09/22/13   Historical Provider, MD  DULoxetine (CYMBALTA) 60 MG capsule Take 60 mg by mouth at bedtime.    Historical Provider, MD  ferrous sulfate 325 (65 FE) MG tablet Take 325 mg by mouth 2 (two) times daily.      Historical Provider, MD  fish oil-omega-3 fatty acids 1000 MG capsule Take 2 g by mouth daily.    Historical Provider, MD  hydrochlorothiazide (MICROZIDE) 12.5 MG capsule Take 12.5 mg by mouth daily.  07/14/13   Historical Provider, MD  hydroxyurea (HYDREA) 500 MG capsule Take 500 mg by mouth daily.  07/14/13   Historical Provider, MD  lisinopril (PRINIVIL,ZESTRIL) 10 MG tablet Take 10 mg by mouth daily.      Historical Provider, MD  lisinopril (PRINIVIL,ZESTRIL) 20 MG tablet  11/10/13   Historical Provider, MD  metoprolol succinate (TOPROL-XL) 25 MG 24 hr tablet Take 12.5 mg by mouth daily.  07/14/13   Historical Provider, MD  Multiple Vitamin (MULTIVITAMIN) tablet Take 1 tablet by mouth daily.      Historical Provider, MD  predniSONE (DELTASONE) 1 MG tablet  03/05/14   Historical Provider, MD  predniSONE (DELTASONE) 5 MG tablet Take 7.5 mg by mouth daily with breakfast.     Historical Provider, MD  risedronate (ACTONEL) 150 MG tablet 150 mg every 30 (thirty) days. 09/23/13   Historical Provider, MD  traMADol (ULTRAM) 50 MG tablet Take 100 mg by mouth every 6 (six) hours as needed.      Historical Provider, MD  warfarin (COUMADIN) 5 MG tablet  12/19/13   Historical Provider, MD  warfarin (COUMADIN) 7.5 MG tablet Take 7.5 mg by mouth daily.    Historical Provider, MD   BP 137/74  Pulse 87  Temp(Src) 98.5 F (36.9 C) (Oral)  Resp 40  SpO2 100%  LMP 12/20/2002 Physical Exam  Nursing note and vitals  reviewed. Constitutional: She is oriented to person, place, and time. She appears well-developed and well-nourished.  HENT:  Head: Normocephalic and atraumatic.  Nose: Nose normal.  Mouth/Throat: Oropharynx is clear and moist.  Eyes: Conjunctivae are normal. Pupils are equal, round, and reactive to light.  Neck: Normal range of motion. Neck supple. No tracheal deviation present.  Cardiovascular: Normal rate, regular rhythm and normal heart sounds.   No murmur heard. Pulmonary/Chest: Effort normal and breath sounds normal. No respiratory distress.  She has no wheezes. She has no rales. She exhibits no tenderness.  Gastric sounds heard in bilateral upper chest  Abdominal: Soft. Bowel sounds are normal. She exhibits no distension and no mass. There is no tenderness.  Musculoskeletal: Normal range of motion. She exhibits tenderness (left shoulder). She exhibits no edema.  No lower extremity edema, calf tenderness, warmth, erythema or palpable cords  Neurological: She is alert and oriented to person, place, and time.  Skin: Skin is warm and dry. No rash noted.  Psychiatric: She has a normal mood and affect.    ED Course  Procedures (including critical care time) Labs Review Labs Reviewed  CBC - Abnormal; Notable for the following:    RBC 3.12 (*)    Hemoglobin 10.2 (*)    HCT 32.1 (*)    MCV 102.9 (*)    RDW 16.6 (*)    Platelets >900 (*)    All other components within normal limits  PROTIME-INR - Abnormal; Notable for the following:    Prothrombin Time 23.0 (*)    INR 2.04 (*)    All other components within normal limits  I-STAT CHEM 8, ED - Abnormal; Notable for the following:    Glucose, Bld 168 (*)    Hemoglobin 11.9 (*)    HCT 35.0 (*)    All other components within normal limits  I-STAT TROPOININ, ED    Imaging Review Dg Chest 2 View  05/03/2014   CLINICAL DATA:  Patient with chest, back, neck, shoulder pain. Hip replacement 3 weeks ago. History of DVT and lung biopsy 3  years ago.  EXAM: CHEST  2 VIEW  COMPARISON:  03/28/2011  FINDINGS: Heart is mildly enlarged. There is a large hiatal hernia. There are no focal consolidations or pleural effusions. No pulmonary edema. The patient is kyphotic. There are associated thoracic degenerative changes. Thoracic wedge compression fractures are present, age indeterminate.  IMPRESSION: 1. Large hiatal hernia. 2. Cardiomegaly without pulmonary edema. 3. Thoracic wedge compression fractures.   Electronically Signed   By: Shon Hale M.D.   On: 05/03/2014 17:31   Ct Angio Chest Aortic Dissect W &/or W/o  05/03/2014   CLINICAL DATA:  Sudden onset of chest pain today radiating into the left arm. History of Wegener's granulomatosis.  EXAM: CT ANGIOGRAPHY CHEST WITH CONTRAST  TECHNIQUE: Multidetector CT imaging of the chest was performed using the standard protocol during bolus administration of intravenous contrast. Multiplanar CT image reconstructions and MIPs were obtained to evaluate the vascular anatomy.  CONTRAST:  146mL OMNIPAQUE IOHEXOL 350 MG/ML SOLN  COMPARISON:  Chest x-ray dated 05/03/2014 and chest CT dated 12/01/2010  FINDINGS: There are no pulmonary emboli. There is no aortic dissection. There is only minimal coronary artery calcification. Heart size is normal. There are no infiltrates or effusions. The nodular lesions in both lungs seen on the prior study have resolved. There is slight to accentuation of the interstitial markings. No hilar or mediastinal adenopathy. There is chronic accentuation of the thoracic kyphosis. There is a small compression deformity of the superior endplate of Y69 which does not appear acute.  Interval was splenic artery embolization. The spleen is now atrophic measuring only 4 cm in diameter.  There is slight nonspecific haziness in the periaortic fat in the upper abdomen. This might be related to the patient's of Wegener's disease.  Review of the MIP images confirms the above findings.  IMPRESSION: 1.  No pulmonary emboli or aortic dissection. 2. Large hiatal hernia, increased since the  prior CT scan of 01/13/2011. 3. Resolution of pulmonary nodules and adenopathy.   Electronically Signed   By: Rozetta Nunnery M.D.   On: 05/03/2014 18:23     EKG Interpretation   Date/Time:  Sunday May 03 2014 16:01:36 EDT Ventricular Rate:  93 PR Interval:  140 QRS Duration: 77 QT Interval:  350 QTC Calculation: 435 R Axis:   -16 Text Interpretation:  Sinus rhythm Biatrial enlargement Borderline left  axis deviation Low voltage, precordial leads No significant change since  last tracing Confirmed by BEATON  MD, ROBERT ([id number]) on 05/03/2014 4:31:08  PM      MDM   Final diagnoses:  Hiatal hernia   Pt with Wegeners, PE hx on coumadin, hiatal hernia hx who presents for acute onset a sharp chest pain radiating between shoulder blades and left shoulder. Concern for Dissection vs hiatal hernia given exam and history.  Anticoagulated, doubt PE. Gastric sounds heard in upper chest c,w large hiatal hernia seen on CXR.  Pain onset was with meals. EKG without ischemic changes.  NTG does not relieve pain.  Somewhat uncomfortable appearing. Given risk factors for dissection, CTA ordered which was negative for dissection. PT to start Nexium and follow up with PCP this week. Will return for worsening of condition.      Tammy Sours, MD 05/04/14 [phone]

## 2014-05-03 NOTE — ED Notes (Signed)
Pt. Ambulated to restroom, denies SOB. States she is starting to feel better. O2 96% RA.

## 2014-05-04 LAB — PATHOLOGIST SMEAR REVIEW: Path Review: INCREASED

## 2014-05-06 NOTE — ED Provider Notes (Signed)
I saw and evaluated the patient, reviewed the resident's note and I agree with the findings and plan.   .Face to face Exam:  General:  Awake HEENT:  Atraumatic Resp:  Normal effort Abd:  Nondistended Neuro:No focal weakness  Dot Lanes, MD 05/06/14 [phone]

## 2014-05-27 ENCOUNTER — Other Ambulatory Visit: Payer: Self-pay

## 2014-05-27 DIAGNOSIS — Z1231 Encounter for screening mammogram for malignant neoplasm of breast: Secondary | ICD-10-CM

## 2014-06-04 ENCOUNTER — Ambulatory Visit
Admission: RE | Admit: 2014-06-04 | Discharge: 2014-06-04 | Disposition: A | Discharge status: Home / Self Care | Payer: Medicare Other | Source: Ambulatory Visit

## 2014-06-04 DIAGNOSIS — Z1231 Encounter for screening mammogram for malignant neoplasm of breast: Secondary | ICD-10-CM

## 2014-06-05 ENCOUNTER — Other Ambulatory Visit: Payer: Self-pay

## 2014-06-12 ENCOUNTER — Telehealth: Payer: Self-pay | Admitting: Obstetrics and Gynecology

## 2014-06-12 NOTE — Telephone Encounter (Signed)
LMTCB to reschedule her upcoming appointment for 09/2014 for AEX with Dr. Quincy Simmonds.

## 2014-10-05 ENCOUNTER — Ambulatory Visit: Payer: Medicare Other | Admitting: Obstetrics and Gynecology

## 2014-10-26 ENCOUNTER — Ambulatory Visit: Payer: Self-pay | Admitting: Certified Nurse Midwife

## 2014-10-26 ENCOUNTER — Telehealth: Payer: Self-pay | Admitting: Certified Nurse Midwife

## 2014-10-26 NOTE — Telephone Encounter (Signed)
Patient canceled and rescheduled appointment due to insurance.

## 2014-10-28 NOTE — Telephone Encounter (Signed)
Called and left a message (DPR on file to leave details on home number) we have approval for her appointment 10/29/14 from [facility].

## 2014-10-28 NOTE — Telephone Encounter (Signed)
[employer] called to give authorization #: [id number] for DOS range 3/10-4/10/16. Patient has an appointment for AEX 10/29/14. Notes added to the appointment.  Routing to [place] for [employer].

## 2014-10-29 ENCOUNTER — Encounter: Payer: Self-pay | Admitting: Certified Nurse Midwife

## 2014-10-29 ENCOUNTER — Ambulatory Visit (INDEPENDENT_AMBULATORY_CARE_PROVIDER_SITE_OTHER): Payer: Medicare Other | Admitting: Certified Nurse Midwife

## 2014-10-29 VITALS — BP 118/70 | HR 68 | Resp 20 | Ht 62.0 in | Wt 212.0 lb

## 2014-10-29 DIAGNOSIS — Z0000 Encounter for general adult medical examination without abnormal findings: Secondary | ICD-10-CM

## 2014-10-29 DIAGNOSIS — Z01419 Encounter for gynecological examination (general) (routine) without abnormal findings: Secondary | ICD-10-CM

## 2014-10-29 DIAGNOSIS — Z124 Encounter for screening for malignant neoplasm of cervix: Secondary | ICD-10-CM | POA: Diagnosis not present

## 2014-10-29 LAB — POCT URINALYSIS DIPSTICK
Bilirubin, UA: NEGATIVE
Glucose, UA: NEGATIVE
Ketones, UA: NEGATIVE
Nitrite, UA: NEGATIVE
Protein, UA: NEGATIVE
Urobilinogen, UA: NEGATIVE
pH, UA: 5

## 2014-10-29 NOTE — Progress Notes (Signed)
Reviewed personally.  M. Suzanne Tabius Rood, MD.  

## 2014-10-29 NOTE — Progress Notes (Signed)
64 y.o. G0P0000 Divorced  Caucasian Fe here for annual exam. Patient being seen by Dr. [name] for PCP and Rheumatology and Hematology at [facility] for management of Wegner's disease. Denies vaginal bleeding. Vaginal dryness is becoming a problem. Patient has hearing loss but no hearing aids. She has been recently seen by urologist for urinary urgency and Mybetriq is working well. Denies any urinary symptoms today.Medication management stable with MD's. No other health issues today.  Patient's last menstrual period was 12/20/2002.          Sexually active: No.  The current method of family planning is post menopausal status.    Exercising: Yes.    Walking  Smoker:  no  Health Maintenance: Pap: 08/2011 neg. HR HPV:Neg MMG:  06/05/14 BIRADS1:neg  Density B Self Breast Check: Yes: Monthly Colonoscopy: 10/2011 Normal. Repeat 10 years  BMD:  02/15/12  TDaP:  2012 Labs: PCP UA: WBC=Tracel RBC=Trace   reports that she has never smoked. She has never used smokeless tobacco. She reports that she does not drink alcohol or use illicit drugs.  Past Medical History  Diagnosis Date  . Hypertension   . Anemia     recurrent iron defic.  Marland Kitchen OSA (obstructive sleep apnea)   . Allergic rhinitis   . GERD (gastroesophageal reflux disease)   . Hypertension   . Osteoarthritis   . DDD (degenerative disc disease)   . Depression     Dr. Toy Care (situational, divorce, loss of parents)  . Diverticulosis   . Hyperlipidemia   . Hiatal hernia   . Esophageal stricture   . Wegner's disease (congenital syphilitic osteochondritis) 2012    DR. Ginger Organ at [place]  . Neuropathy, peripheral   . DVT (deep venous thrombosis)     both legs  . Pulmonary embolism     left lung  . Cervical polyp 03/2003    Past Surgical History  Procedure Laterality Date  . Lung biospy  may 2012  . Bronchoscopy  april 2012  . Back injection      steroid injection x2  . Iron infusion  11/14    seeing hematologist at [facility]  . Partial hip  arthroplasty Left 03/2014    Current Outpatient Prescriptions  Medication Sig Dispense Refill  . acetaminophen (TYLENOL) 325 MG tablet Take 650 mg by mouth as needed.      Marland Kitchen azaTHIOprine (IMURAN) 50 MG tablet Take 150 mg by mouth daily.     . calcium carbonate (OS-CAL) 600 MG TABS tablet Take by mouth.    . Cholecalciferol (VITAMIN D PO) Take by mouth daily.    . DULoxetine (CYMBALTA) 60 MG capsule Take 60 mg by mouth at bedtime.    . ferrous sulfate 325 (65 FE) MG tablet Take 325 mg by mouth 2 (two) times daily.      . fish oil-omega-3 fatty acids 1000 MG capsule Take 2 g by mouth daily.    . hydrochlorothiazide (MICROZIDE) 12.5 MG capsule Take 12.5 mg by mouth daily.     . hydroxyurea (HYDREA) 500 MG capsule Take 1,000 mg by mouth daily.     Marland Kitchen lisinopril (PRINIVIL,ZESTRIL) 10 MG tablet Take 10 mg by mouth daily.      . metoprolol succinate (TOPROL-XL) 25 MG 24 hr tablet Take 12.5 mg by mouth daily.     . Multiple Vitamin (MULTIVITAMIN) tablet Take 1 tablet by mouth daily.      Marland Kitchen MYRBETRIQ 50 MG TB24 tablet Take 1 tablet by mouth daily.  10  .  predniSONE (DELTASONE) 5 MG tablet Take 7.5 mg by mouth daily with breakfast.     . risedronate (ACTONEL) 150 MG tablet 150 mg every 30 (thirty) days.    . traMADol (ULTRAM) 50 MG tablet Take 100 mg by mouth every 6 (six) hours as needed.      . warfarin (COUMADIN) 7.5 MG tablet Take 7.5 mg by mouth daily.     No current facility-administered medications for this visit.    Family History  Problem Relation Age of Onset  . Hypertension Mother   . Neuropathy Mother   . Varicose Veins Mother   . Stroke Father   . Lung cancer Father   . Arthritis Father   . Cancer Father   . Deep vein thrombosis Father   . Hypertension Father   . Varicose Veins Father   . Diabetes Maternal Grandfather   . Diabetes Maternal Uncle   . Diabetes Maternal Aunt   . Colon cancer Neg Hx   . Hypertension Sister     ROS:  Pertinent items are noted in HPI.   Otherwise, a comprehensive ROS was negative.  Exam:   BP 118/70 mmHg  Pulse 68  Resp 20  Ht 5\' 2"  (1.575 m)  Wt 212 lb (96.163 kg)  BMI 38.77 kg/m2  LMP 12/20/2002 Height: 5\' 2"  (157.5 cm) Ht Readings from Last 3 Encounters:  10/29/14 5\' 2"  (1.575 m)  03/13/14 5\' 3"  (1.6 m)  01/02/14 5\' 3"  (1.6 m)    General appearance: alert, cooperative and appears stated age Head: Normocephalic, without obvious abnormality, atraumatic Neck: no adenopathy, supple, symmetrical, trachea midline and thyroid normal to inspection and palpation Lungs: clear to auscultation bilaterally Breasts: normal appearance, no masses or tenderness, No nipple retraction or dimpling, No nipple discharge or bleeding, No axillary or supraclavicular adenopathy, pendulous Heart: regular rate and rhythm Abdomen: soft, non-tender; no masses,  no organomegaly Extremities: extremities normal, atraumatic, no cyanosis or edema Skin: Skin color, texture, turgor normal. No rashes or lesions Lymph nodes: Cervical, supraclavicular, and axillary nodes normal. No abnormal inguinal nodes palpated Neurologic: Grossly normal   Pelvic: External genitalia:  no lesions              Urethra:  normal appearing urethra with no masses, tenderness or lesions              Bartholin's and Skene's: normal                 Vagina: atrophic appearing vagina with normal color and scant discharge, no lesions              Cervix: retroverted and normal, no lesions, non tender, bleeding with pap only              Pap taken: Yes.   Bimanual Exam:  Uterus:  normal size, contour, position, consistency, mobility, non-tender              Adnexa: normal adnexa and no mass, fullness, tenderness               Rectovaginal: Confirms               Anus:  normal sphincter tone, no lesions  Chaperone present: Yes  A:  Well Woman with normal exam  Menopausal no HRT  Atrophic Vaginitis  Wegner's Disease with hematology and Rheumatology  management  History of DVT on Coumadin  Multi medication  Osteopenia with PCP management  Recent left hip replacement  P:  Reviewed health and wellness pertinent to exam  Aware if vaginal bleeding will need to advise.  Discussed Coconut oil twice daily for 2 weeks and then daily at hs and then with sexual activity. Recheck if needed.  Continue follow up with MD management as indicated.  Pap smear taken today with HPVHR.  counseled on breast self exam, mammography screening, adequate intake of calcium and vitamin D, diet and exercise  return annually or prn  An After Visit Summary was printed and given to the patient.

## 2014-10-29 NOTE — Patient Instructions (Signed)
EXERCISE AND DIET:  We recommended that you start or continue a regular exercise program for good health. Regular exercise means any activity that makes your heart beat faster and makes you sweat.  We recommend exercising at least 30 minutes per day at least 3 days a week, preferably 4 or 5.  We also recommend a diet low in fat and sugar.  Inactivity, poor dietary choices and obesity can cause diabetes, heart attack, stroke, and kidney damage, among others.    ALCOHOL AND SMOKING:  Women should limit their alcohol intake to no more than 7 drinks/beers/glasses of wine (combined, not each!) per week. Moderation of alcohol intake to this level decreases your risk of breast cancer and liver damage. And of course, no recreational drugs are part of a healthy lifestyle.  And absolutely no smoking or even second hand smoke. Most people know smoking can cause heart and lung diseases, but did you know it also contributes to weakening of your bones? Aging of your skin?  Yellowing of your teeth and nails?  CALCIUM AND VITAMIN D:  Adequate intake of calcium and Vitamin D are recommended.  The recommendations for exact amounts of these supplements seem to change often, but generally speaking 600 mg of calcium (either carbonate or citrate) and 800 units of Vitamin D per day seems prudent. Certain women may benefit from higher intake of Vitamin D.  If you are among these women, your doctor will have told you during your visit.    PAP SMEARS:  Pap smears, to check for cervical cancer or precancers,  have traditionally been done yearly, although recent scientific advances have shown that most women can have pap smears less often.  However, every woman still should have a physical exam from her gynecologist every year. It will include a breast check, inspection of the vulva and vagina to check for abnormal growths or skin changes, a visual exam of the cervix, and then an exam to evaluate the size and shape of the uterus and  ovaries.  And after 63 years of age, a rectal exam is indicated to check for rectal cancers. We will also provide age appropriate advice regarding health maintenance, like when you should have certain vaccines, screening for sexually transmitted diseases, bone density testing, colonoscopy, mammograms, etc.   MAMMOGRAMS:  All women over 40 years old should have a yearly mammogram. Many facilities now offer a "3D" mammogram, which may cost around $50 extra out of pocket. If possible,  we recommend you accept the option to have the 3D mammogram performed.  It both reduces the number of women who will be called back for extra views which then turn out to be normal, and it is better than the routine mammogram at detecting truly abnormal areas.    COLONOSCOPY:  Colonoscopy to screen for colon cancer is recommended for all women at age 50.  We know, you hate the idea of the prep.  We agree, BUT, having colon cancer and not knowing it is worse!!  Colon cancer so often starts as a polyp that can be seen and removed at colonscopy, which can quite literally save your life!  And if your first colonoscopy is normal and you have no family history of colon cancer, most women don't have to have it again for 10 years.  Once every ten years, you can do something that may end up saving your life, right?  We will be happy to help you get it scheduled when you are ready.    Be sure to check your insurance coverage so you understand how much it will cost.  It may be covered as a preventative service at no cost, but you should check your particular policy.     Atrophic Vaginitis Atrophic vaginitis is a problem of low levels of estrogen in women. This problem can happen at any age. It is most common in women who have gone through menopause ("the change").  HOW WILL I KNOW IF I HAVE THIS PROBLEM? You may have:  Trouble with peeing (urinating), such as:  Going to the bathroom often.  A hard time holding your pee until you reach  a bathroom.  Leaking pee.  Having pain when you pee.  Itching or a burning feeling.  Vaginal bleeding and spotting.  Pain during sex.  Dryness of the vagina.  A yellow, bad-smelling fluid (discharge) coming from the vagina. HOW WILL MY DOCTOR CHECK FOR THIS PROBLEM?  During your exam, your doctor will likely find the problem.  If there is a vaginal fluid, it may be checked for infection. HOW WILL THIS PROBLEM BE TREATED? Keep the vulvar skin as clean as possible. Moisturizers and lubricants can help with some of the symptoms. Coconut oil twice daily as instructed and prior to sexual activity. Problem please call.  Have a great day  [name]

## 2014-11-02 LAB — IPS PAP TEST WITH HPV

## 2014-12-02 ENCOUNTER — Telehealth: Payer: Self-pay | Admitting: Certified Nurse Midwife

## 2014-12-02 NOTE — Telephone Encounter (Signed)
Left message regarding upcoming appointment has been canceled and needs to be rescheduled. °

## 2015-02-26 ENCOUNTER — Encounter: Payer: Self-pay | Admitting: Internal Medicine

## 2015-09-17 ENCOUNTER — Other Ambulatory Visit: Payer: Self-pay

## 2015-09-17 DIAGNOSIS — Z1231 Encounter for screening mammogram for malignant neoplasm of breast: Secondary | ICD-10-CM

## 2015-10-11 ENCOUNTER — Ambulatory Visit
Admission: RE | Admit: 2015-10-11 | Discharge: 2015-10-11 | Disposition: A | Discharge status: Home / Self Care | Payer: Medicare Other | Source: Ambulatory Visit

## 2015-10-11 DIAGNOSIS — Z1231 Encounter for screening mammogram for malignant neoplasm of breast: Secondary | ICD-10-CM

## 2015-11-01 ENCOUNTER — Ambulatory Visit: Payer: Medicare Other | Admitting: Certified Nurse Midwife

## 2015-11-02 ENCOUNTER — Encounter: Payer: Self-pay | Admitting: Certified Nurse Midwife

## 2015-11-02 ENCOUNTER — Ambulatory Visit (INDEPENDENT_AMBULATORY_CARE_PROVIDER_SITE_OTHER): Payer: Medicare Other | Admitting: Certified Nurse Midwife

## 2015-11-02 VITALS — BP 116/64 | HR 76 | Resp 14 | Ht 62.0 in | Wt 201.2 lb

## 2015-11-02 DIAGNOSIS — Z01419 Encounter for gynecological examination (general) (routine) without abnormal findings: Secondary | ICD-10-CM | POA: Diagnosis not present

## 2015-11-02 NOTE — Progress Notes (Signed)
65 y.o. G0P0000 Divorced  Caucasian Fe here for annual exam.  Menopausal no HRT. Denies vaginal bleeding. Still having vaginal dryness, using coconut oil, with some difficulty."Any suggestions"? Still having some urinary urgency, saw Urology for urgency and was put on medication, but she felt it did not help, so stopped. Urgency has actually decreased.Sees Dr [name] for medication management of anxiety, hypertension, labs and aex. Now off coumadin and on Eliguis, doing well. No other health issues today.  Patient's last menstrual period was 12/20/2002.          Sexually active: Yes.    The current method of family planning is none.    Exercising: Yes.    Gym/ health club routine includes water walking. Smoker:  no  Health Maintenance: Pap:  10-29-14 HPVHR neg MMG:  10-11-15 Category B Bi-Rads 1 Neg Colonoscopy:  10-31-11-Diverticulosis- Return 83yr BMD:   02-15-12 TDaP:  2012 Hep C: decline  HIV: Neg Labs: PCP   reports that she has never smoked. She has never used smokeless tobacco. She reports that she does not drink alcohol or use illicit drugs.  Past Medical History  Diagnosis Date  . Hypertension   . Anemia     recurrent iron defic.  .Marland KitchenOSA (obstructive sleep apnea)   . Allergic rhinitis   . GERD (gastroesophageal reflux disease)   . Hypertension   . Osteoarthritis   . DDD (degenerative disc disease)   . Depression     Dr. KToy Care(situational, divorce, loss of parents)  . Diverticulosis   . Hyperlipidemia   . Hiatal hernia   . Esophageal stricture   . Wegner's disease (congenital syphilitic osteochondritis) 2012    [place] [place] [place]   . DVT (deep venous thrombosis) ([facility])     both legs  . Pulmonary embolism ([facility]     left lung  . Cervical polyp 03/2003    Past Surgical History  Procedure Laterality Date  . Lung biospy  may 2012  . Bronchoscopy  april 2012  . Back injection      steroid injection x2  . Iron infusion  11/14    [facility] . [facility]plasty Left 03/2014    Current Outpatient Prescriptions  Medication Sig Dispense Refill  . acetaminophen (TYLENOL) 325 MG tablet Take 650 mg by mouth as needed.      .Marland Kitchenapixaban (ELIQUIS) 5 MG TABS tablet Take 5 mg by mouth 2 (two) times daily.    .Marland KitchenazaTHIOprine (IMURAN) 50 MG tablet Take 150 mg by mouth daily.     . calcium carbonate (OS-CAL) 600 MG TABS tablet Take by mouth.    . Cholecalciferol (VITAMIN D PO) Take by mouth daily.    . DULoxetine (CYMBALTA) 60 MG capsule Take 60 mg by mouth at bedtime.    . ferrous sulfate 325 (65 FE) MG tablet Take 325 mg by mouth 2 (two) times daily.      . fish oil-omega-3 fatty acids 1000 MG capsule Take 2 g by mouth daily.    . hydrochlorothiazide (MICROZIDE) 12.5 MG capsule Take 12.5 mg by mouth daily.     . hydroxyurea (HYDREA) 500 MG capsule Take 1,000 mg by mouth daily.     .Marland Kitchenlisinopril (PRINIVIL,ZESTRIL) 10 MG tablet Take 10 mg by mouth daily.      . metoprolol succinate (TOPROL-XL) 25 MG 24 hr tablet Take 12.5 mg by mouth daily.     . Multiple  Vitamin (MULTIVITAMIN) tablet Take 1 tablet by mouth daily.      Marland Kitchen nystatin (MYCOSTATIN/NYSTOP) 100000 UNIT/GM POWD     . predniSONE (DELTASONE) 5 MG tablet Take 7.5 mg by mouth daily with breakfast.     . traMADol (ULTRAM) 50 MG tablet Take 100 mg by mouth every 6 (six) hours as needed.      . warfarin (COUMADIN) 7.5 MG tablet Take 7.5 mg by mouth daily.    Marland Kitchen ibandronate (BONIVA) 150 MG tablet Take by mouth.     No current facility-administered medications for this visit.    Family History  Problem Relation Age of Onset  . Hypertension Mother   . Neuropathy Mother   . Varicose Veins Mother   . Stroke Father   . Lung cancer Father   . Arthritis Father   . Cancer Father   . Deep vein thrombosis Father   . Hypertension Father   . Varicose Veins Father   . Diabetes Maternal Grandfather   . Diabetes Maternal Uncle   . Diabetes Maternal Aunt   . Colon  cancer Neg Hx   . Hypertension Sister     ROS:  Pertinent items are noted in HPI.  Otherwise, a comprehensive ROS was negative.  Exam:   BP 116/64 mmHg  Pulse 76  Resp 14  Ht '5\' 2"'$  (1.575 m)  Wt 201 lb 3.2 oz (91.264 kg)  BMI 36.79 kg/m2  LMP 12/20/2002 Height: '5\' 2"'$  (157.5 cm) Ht Readings from Last 3 Encounters:  11/02/15 '5\' 2"'$  (1.575 m)  10/29/14 '5\' 2"'$  (1.575 m)  03/13/14 '5\' 3"'$  (1.6 m)    General appearance: alert, cooperative and appears stated age Head: Normocephalic, without obvious abnormality, atraumatic Neck: no adenopathy, supple, symmetrical, trachea midline and thyroid normal to inspection and palpation Lungs: clear to auscultation bilaterally Breasts: normal appearance, no masses or tenderness, No nipple retraction or dimpling, No nipple discharge or bleeding, No axillary or supraclavicular adenopathy Heart: regular rate and rhythm Abdomen: soft, non-tender; no masses,  no organomegaly Extremities: extremities normal, atraumatic, no cyanosis or edema Skin: Skin color, texture, turgor normal. No rashes or lesions Lymph nodes: Cervical, supraclavicular, and axillary nodes normal. No abnormal inguinal nodes palpated Neurologic: Grossly normal   Pelvic: External genitalia:  no lesions              Urethra:  normal appearing urethra with no masses, tenderness or lesions              Bartholin's and Skene's: normal                 Vagina: normal appearing vagina with normal color and discharge, no lesions              Cervix: normal appearance, no lesions or tenderness              Pap taken: No. Bimanual Exam:  Uterus:  normal size, contour, position, consistency, mobility, non-tender              Adnexa: normal adnexa and no mass, fullness, tenderness               Rectovaginal: Confirms               Anus:  normal sphincter tone, no lesions  Chaperone present: yes  A:  Well Woman with normal exam  Menopausal no HRT  Vaginal dryness using coconut  oil  Hypertension/diabetes/anxiety with PCP management  History of DVT   P:  Reviewed health and wellness pertinent to exam  Aware of need to evaluate if vaginal bleeding  Discussed applicator use for coconut oil nightly, given new applicator to try with instructions, will advise if issues  Continue follow up with PCP and MD as indicated  Pap smear as above not taken   counseled on breast self exam, mammography screening, menopause, adequate intake of calcium and vitamin D, diet and exercise  return annually or prn  An After Visit Summary was printed and given to the patient.

## 2015-11-02 NOTE — Patient Instructions (Signed)

## 2015-11-03 NOTE — Progress Notes (Signed)
Encounter reviewed Andrey Hoobler, MD   

## 2016-01-11 ENCOUNTER — Encounter (HOSPITAL_COMMUNITY): Payer: Self-pay

## 2016-11-02 ENCOUNTER — Ambulatory Visit: Payer: Medicare Other | Admitting: Certified Nurse Midwife

## 2016-11-07 ENCOUNTER — Encounter: Payer: Self-pay | Admitting: Certified Nurse Midwife

## 2016-11-07 ENCOUNTER — Ambulatory Visit (INDEPENDENT_AMBULATORY_CARE_PROVIDER_SITE_OTHER): Payer: Medicare Other | Admitting: Certified Nurse Midwife

## 2016-11-07 VITALS — BP 118/70 | HR 60 | Ht 60.75 in | Wt 195.0 lb

## 2016-11-07 DIAGNOSIS — Z01419 Encounter for gynecological examination (general) (routine) without abnormal findings: Secondary | ICD-10-CM

## 2016-11-07 DIAGNOSIS — Z124 Encounter for screening for malignant neoplasm of cervix: Secondary | ICD-10-CM | POA: Diagnosis not present

## 2016-11-07 NOTE — Patient Instructions (Signed)

## 2016-11-07 NOTE — Progress Notes (Signed)
66 y.o. G0P0000 Divorced  Caucasian Fe here for annual exam.  Menopausal no HRT. Complaining of 5 UTI's since 12/17. Has been seen by PCP and treated with antibiotic each time. Has not always been seen, but treated over the phone several times. Was seen by NP last time and was treated with antibiotic/diflucan with urine culture being E.Coli. Patient has appointment with Urology soon. Denies vaginal dryness or vaginal bleeding. Does not use lubricant or moisturizer for sexual activity. Feels it may be post coital. Empties bladder pre and post sexual activity.  Also has noted height change, has BMD scheduled with at PCP office soon. Walking for exercise and staying active. No other health concerns today.  Patient's last menstrual period was 12/20/2002.          Sexually active: Yes.    The current method of family planning is post menopausal status.    Exercising: Yes.    water walking Smoker:  no  Health Maintenance: Pap: 10-29-14 HPV HR neg MMG:  10-11-15 category b density birads 1:neg; scheduled for April 2018 Colonoscopy:  2016 - normal per patient BMD:   2013 TDaP:  2012 Shingles: PCP Pneumonia: 2015 Hep C and HIV: HIV neg Labs: PCP takes care of labs Self breast exam: occasionally   reports that she has never smoked. She has never used smokeless tobacco. She reports that she does not drink alcohol or use drugs.  Past Medical History:  Diagnosis Date  . Allergic rhinitis   . Anemia    recurrent iron defic.  Marland Kitchen Cervical polyp 03/2003  . DDD (degenerative disc disease)   . Depression    Dr. Toy Care (situational, divorce, loss of parents)  . Diverticulosis   . DVT (deep venous thrombosis) (HCC)    both legs  . Esophageal stricture   . GERD (gastroesophageal reflux disease)   . Hiatal hernia   . Hyperlipidemia   . Hypertension   . Hypertension   . Neuropathy, peripheral ([place])   . OSA (obstructive sleep apnea)   . Osteoarthritis   . Pulmonary embolism ([place])    left lung  .  Wegner's disease (congenital syphilitic osteochondritis) 2012   [facility]inger [facility]    Past Surgical History:  Procedure Laterality Date  . back injection     steroid injection x2  . BRONCHOSCOPY  april 2012  . iron infusion  11/14   seeing hematologist at [facility]  . lung biospy  may 2012  . PARTIAL HIP ARTHROPLASTY Left 03/2014    Current Outpatient Prescriptions  Medication Sig Dispense Refill  . acetaminophen (TYLENOL) 325 MG tablet Take 650 mg by mouth as needed.      Marland Kitchen apixaban (ELIQUIS) 5 MG TABS tablet Take 5 mg by mouth 2 (two) times daily.    Marland Kitchen azaTHIOprine (IMURAN) 50 MG tablet Take 150 mg by mouth daily.     . calcium carbonate (OS-CAL) 600 MG TABS tablet Take by mouth.    . Cholecalciferol (VITAMIN D PO) Take by mouth daily.    . DULoxetine (CYMBALTA) 60 MG capsule Take 60 mg by mouth at bedtime.    . ferrous sulfate 325 (65 FE) MG tablet Take 325 mg by mouth 2 (two) times daily. Reported on 11/02/2015    . fish oil-omega-3 fatty acids 1000 MG capsule Take 2 g by mouth daily.    . hydrochlorothiazide (MICROZIDE) 12.5 MG capsule Take 12.5 mg by mouth daily.     . hydroxyurea (HYDREA) 500 MG capsule Take  1,000 mg by mouth daily.     Marland Kitchen ibandronate (BONIVA) 150 MG tablet Take by mouth.    Marland Kitchen lisinopril (PRINIVIL,ZESTRIL) 10 MG tablet Take 10 mg by mouth daily.      . Multiple Vitamin (MULTIVITAMIN) tablet Take 1 tablet by mouth daily.      Marland Kitchen nystatin (MYCOSTATIN/NYSTOP) 100000 UNIT/GM POWD Reported on 11/02/2015    . traMADol (ULTRAM) 50 MG tablet Take 100 mg by mouth every 6 (six) hours as needed.      . triamcinolone cream (KENALOG) 0.1 % Apply topically 2 (two) times daily.     No current facility-administered medications for this visit.     Family History  Problem Relation Age of Onset  . Hypertension Mother   . Neuropathy Mother   . Varicose Veins Mother   . Stroke Father   . Lung cancer Father   . Arthritis Father   . Cancer Father   . Deep vein thrombosis Father    . Hypertension Father   . Varicose Veins Father   . Diabetes Maternal Grandfather   . Diabetes Maternal Uncle   . Diabetes Maternal Aunt   . Hypertension Sister   . Colon cancer Neg Hx     ROS:  Pertinent items are noted in HPI.  Otherwise, a comprehensive ROS was negative.  Exam:   BP 118/70 (BP Location: Right Arm, Patient Position: Sitting, Cuff Size: Normal)   Pulse 60   Ht 5' 0.75" (1.543 m)   Wt 195 lb (88.5 kg)   LMP 12/20/2002   BMI 37.15 kg/m  Height: 5' 0.75" (154.3 cm) Ht Readings from Last 3 Encounters:  11/07/16 5' 0.75" (1.543 m)  11/02/15 '5\' 2"'$  (1.575 m)  10/29/14 '5\' 2"'$  (1.575 m)    General appearance: alert, cooperative and appears stated age Head: Normocephalic, without obvious abnormality, atraumatic Neck: no adenopathy, supple, symmetrical, trachea midline and thyroid normal to inspection and palpation Lungs: clear to auscultation bilaterally Breasts: normal appearance, no masses or tenderness, No nipple retraction or dimpling, No nipple discharge or bleeding, No axillary or supraclavicular adenopathy Heart: regular rate and rhythm Abdomen: soft, non-tender; no masses,  no organomegaly Extremities: extremities normal, atraumatic, no cyanosis or edema Skin: Skin color, texture, turgor normal. No rashes or lesions Lymph nodes: Cervical, supraclavicular, and axillary nodes normal. No abnormal inguinal nodes palpated Neurologic: Grossly normal Significant hyperkyphosis noted of back.   Pelvic: External genitalia:  no lesions              Urethra:  normal appearing urethra with no masses, tenderness or lesions              Bartholin's and Skene's: normal                 Vagina: normal appearing vagina with normal color and discharge, no lesions              Cervix: no cervical motion tenderness and no lesions              Pap taken: Yes.   Bimanual Exam:  Uterus:  normal size, contour, position, consistency, mobility, non-tender              Adnexa:  normal adnexa and no mass, fullness, tenderness               Rectovaginal: Confirms               Anus:  normal sphincter tone, no lesions  Chaperone present: yes  A:  Well Woman with normal exam  Menopausal no HRT  ? Post coital UTI's, has urology visit in one week  Decrease in height of 1.5 inches with re-measure, has BMD scheduled soon with PCP  Anxiety,anemia, hypertension, osteopenia with PCP management  P:   Reviewed health and wellness pertinent to exam  Aware of need to evaluate if vaginal bleeding  Discussed vaginal dryness can increase risk of UTI, along with decrease fluid intake. Recommend she use coconut oil around urinary meatus daily and before sexual activity. Discussed with urology her concerns, may need post coital antibiotic use. Patient thankful for suggestions.  Work on making sure she is trying to improve posture, to limit kyphosis. Discuss with PCP her BMD plan if changes.  Continue follow up with PCP as indicated.  Pap smear as above   counseled on breast self exam, mammography screening, adequate intake of calcium and vitamin D, diet and exercise, Kegel's exercises  return annually or prn  An After Visit Summary was printed and given to the patient.

## 2016-11-08 LAB — IPS PAP SMEAR ONLY

## 2016-11-09 ENCOUNTER — Other Ambulatory Visit: Payer: Self-pay | Admitting: Internal Medicine

## 2016-11-09 DIAGNOSIS — Z1231 Encounter for screening mammogram for malignant neoplasm of breast: Secondary | ICD-10-CM

## 2016-11-12 NOTE — Progress Notes (Signed)
Encounter reviewed Joyce Imel, MD   

## 2016-11-28 ENCOUNTER — Ambulatory Visit
Admission: RE | Admit: 2016-11-28 | Discharge: 2016-11-28 | Disposition: A | Discharge status: Home / Self Care | Payer: Medicare Other | Source: Ambulatory Visit | Attending: Internal Medicine | Admitting: Internal Medicine

## 2016-11-28 DIAGNOSIS — Z1231 Encounter for screening mammogram for malignant neoplasm of breast: Secondary | ICD-10-CM

## 2017-02-05 DIAGNOSIS — A09 Infectious gastroenteritis and colitis, unspecified: Secondary | ICD-10-CM | POA: Diagnosis not present

## 2017-02-05 DIAGNOSIS — Z6834 Body mass index (BMI) 34.0-34.9, adult: Secondary | ICD-10-CM | POA: Diagnosis not present

## 2017-02-05 DIAGNOSIS — R197 Diarrhea, unspecified: Secondary | ICD-10-CM | POA: Diagnosis not present

## 2017-02-05 DIAGNOSIS — E876 Hypokalemia: Secondary | ICD-10-CM | POA: Diagnosis not present

## 2017-02-05 DIAGNOSIS — I480 Paroxysmal atrial fibrillation: Secondary | ICD-10-CM | POA: Diagnosis not present

## 2017-02-05 DIAGNOSIS — K219 Gastro-esophageal reflux disease without esophagitis: Secondary | ICD-10-CM | POA: Diagnosis not present

## 2017-02-12 DIAGNOSIS — E876 Hypokalemia: Secondary | ICD-10-CM | POA: Diagnosis not present

## 2017-02-26 DIAGNOSIS — N3941 Urge incontinence: Secondary | ICD-10-CM | POA: Diagnosis not present

## 2017-02-26 DIAGNOSIS — N390 Urinary tract infection, site not specified: Secondary | ICD-10-CM | POA: Diagnosis not present

## 2017-02-27 DIAGNOSIS — I878 Other specified disorders of veins: Secondary | ICD-10-CM | POA: Diagnosis not present

## 2017-02-27 DIAGNOSIS — R252 Cramp and spasm: Secondary | ICD-10-CM | POA: Diagnosis not present

## 2017-02-27 DIAGNOSIS — M313 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: Secondary | ICD-10-CM | POA: Diagnosis not present

## 2017-02-27 DIAGNOSIS — M25511 Pain in right shoulder: Secondary | ICD-10-CM | POA: Diagnosis not present

## 2017-02-27 DIAGNOSIS — Z79899 Other long term (current) drug therapy: Secondary | ICD-10-CM | POA: Diagnosis not present

## 2017-03-19 DIAGNOSIS — M810 Age-related osteoporosis without current pathological fracture: Secondary | ICD-10-CM | POA: Diagnosis not present

## 2017-04-16 DIAGNOSIS — N3941 Urge incontinence: Secondary | ICD-10-CM | POA: Diagnosis not present

## 2017-04-16 DIAGNOSIS — R350 Frequency of micturition: Secondary | ICD-10-CM | POA: Diagnosis not present

## 2017-04-30 DIAGNOSIS — M313 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: Secondary | ICD-10-CM | POA: Diagnosis not present

## 2017-04-30 DIAGNOSIS — I480 Paroxysmal atrial fibrillation: Secondary | ICD-10-CM | POA: Diagnosis not present

## 2017-04-30 DIAGNOSIS — N390 Urinary tract infection, site not specified: Secondary | ICD-10-CM | POA: Diagnosis not present

## 2017-04-30 DIAGNOSIS — I10 Essential (primary) hypertension: Secondary | ICD-10-CM | POA: Diagnosis not present

## 2017-04-30 DIAGNOSIS — I8310 Varicose veins of unspecified lower extremity with inflammation: Secondary | ICD-10-CM | POA: Diagnosis not present

## 2017-04-30 DIAGNOSIS — Z6834 Body mass index (BMI) 34.0-34.9, adult: Secondary | ICD-10-CM | POA: Diagnosis not present

## 2017-04-30 DIAGNOSIS — M810 Age-related osteoporosis without current pathological fracture: Secondary | ICD-10-CM | POA: Diagnosis not present

## 2017-04-30 DIAGNOSIS — D46Z Other myelodysplastic syndromes: Secondary | ICD-10-CM | POA: Diagnosis not present

## 2017-04-30 DIAGNOSIS — K219 Gastro-esophageal reflux disease without esophagitis: Secondary | ICD-10-CM | POA: Diagnosis not present

## 2017-04-30 DIAGNOSIS — Z23 Encounter for immunization: Secondary | ICD-10-CM | POA: Diagnosis not present

## 2017-05-08 DIAGNOSIS — D473 Essential (hemorrhagic) thrombocythemia: Secondary | ICD-10-CM | POA: Diagnosis not present

## 2017-05-08 DIAGNOSIS — D509 Iron deficiency anemia, unspecified: Secondary | ICD-10-CM | POA: Diagnosis not present

## 2017-05-08 DIAGNOSIS — D469 Myelodysplastic syndrome, unspecified: Secondary | ICD-10-CM | POA: Diagnosis not present

## 2017-06-13 DIAGNOSIS — I7389 Other specified peripheral vascular diseases: Secondary | ICD-10-CM | POA: Diagnosis not present

## 2017-06-13 DIAGNOSIS — I87319 Chronic venous hypertension (idiopathic) with ulcer of unspecified lower extremity: Secondary | ICD-10-CM | POA: Diagnosis not present

## 2017-06-13 DIAGNOSIS — Z6835 Body mass index (BMI) 35.0-35.9, adult: Secondary | ICD-10-CM | POA: Diagnosis not present

## 2017-06-20 DIAGNOSIS — I7389 Other specified peripheral vascular diseases: Secondary | ICD-10-CM | POA: Diagnosis not present

## 2017-06-20 DIAGNOSIS — I87319 Chronic venous hypertension (idiopathic) with ulcer of unspecified lower extremity: Secondary | ICD-10-CM | POA: Diagnosis not present

## 2017-06-20 DIAGNOSIS — Z4800 Encounter for change or removal of nonsurgical wound dressing: Secondary | ICD-10-CM | POA: Diagnosis not present

## 2017-06-20 DIAGNOSIS — Z6825 Body mass index (BMI) 25.0-25.9, adult: Secondary | ICD-10-CM | POA: Diagnosis not present

## 2017-06-27 DIAGNOSIS — M313 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: Secondary | ICD-10-CM | POA: Diagnosis not present

## 2017-06-27 DIAGNOSIS — Z79899 Other long term (current) drug therapy: Secondary | ICD-10-CM | POA: Diagnosis not present

## 2017-06-27 DIAGNOSIS — M25511 Pain in right shoulder: Secondary | ICD-10-CM | POA: Diagnosis not present

## 2017-06-29 DIAGNOSIS — M313 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: Secondary | ICD-10-CM | POA: Diagnosis not present

## 2017-06-29 DIAGNOSIS — M25511 Pain in right shoulder: Secondary | ICD-10-CM | POA: Diagnosis not present

## 2017-06-29 DIAGNOSIS — M6281 Muscle weakness (generalized): Secondary | ICD-10-CM | POA: Diagnosis not present

## 2017-06-29 DIAGNOSIS — R293 Abnormal posture: Secondary | ICD-10-CM | POA: Diagnosis not present

## 2017-07-04 DIAGNOSIS — M25511 Pain in right shoulder: Secondary | ICD-10-CM | POA: Diagnosis not present

## 2017-07-04 DIAGNOSIS — R293 Abnormal posture: Secondary | ICD-10-CM | POA: Diagnosis not present

## 2017-07-04 DIAGNOSIS — M6281 Muscle weakness (generalized): Secondary | ICD-10-CM | POA: Diagnosis not present

## 2017-07-04 DIAGNOSIS — M313 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: Secondary | ICD-10-CM | POA: Diagnosis not present

## 2017-07-06 DIAGNOSIS — M313 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: Secondary | ICD-10-CM | POA: Diagnosis not present

## 2017-07-06 DIAGNOSIS — M6281 Muscle weakness (generalized): Secondary | ICD-10-CM | POA: Diagnosis not present

## 2017-07-06 DIAGNOSIS — M25511 Pain in right shoulder: Secondary | ICD-10-CM | POA: Diagnosis not present

## 2017-07-06 DIAGNOSIS — R293 Abnormal posture: Secondary | ICD-10-CM | POA: Diagnosis not present

## 2017-07-10 DIAGNOSIS — M6281 Muscle weakness (generalized): Secondary | ICD-10-CM | POA: Diagnosis not present

## 2017-07-10 DIAGNOSIS — M25511 Pain in right shoulder: Secondary | ICD-10-CM | POA: Diagnosis not present

## 2017-07-10 DIAGNOSIS — M313 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: Secondary | ICD-10-CM | POA: Diagnosis not present

## 2017-07-10 DIAGNOSIS — R293 Abnormal posture: Secondary | ICD-10-CM | POA: Diagnosis not present

## 2017-07-16 DIAGNOSIS — R293 Abnormal posture: Secondary | ICD-10-CM | POA: Diagnosis not present

## 2017-07-16 DIAGNOSIS — M313 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: Secondary | ICD-10-CM | POA: Diagnosis not present

## 2017-07-16 DIAGNOSIS — M6281 Muscle weakness (generalized): Secondary | ICD-10-CM | POA: Diagnosis not present

## 2017-07-16 DIAGNOSIS — M25511 Pain in right shoulder: Secondary | ICD-10-CM | POA: Diagnosis not present

## 2017-07-19 DIAGNOSIS — M313 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: Secondary | ICD-10-CM | POA: Diagnosis not present

## 2017-07-19 DIAGNOSIS — M6281 Muscle weakness (generalized): Secondary | ICD-10-CM | POA: Diagnosis not present

## 2017-07-19 DIAGNOSIS — M25511 Pain in right shoulder: Secondary | ICD-10-CM | POA: Diagnosis not present

## 2017-07-19 DIAGNOSIS — R293 Abnormal posture: Secondary | ICD-10-CM | POA: Diagnosis not present

## 2017-07-26 DIAGNOSIS — M6281 Muscle weakness (generalized): Secondary | ICD-10-CM | POA: Diagnosis not present

## 2017-07-26 DIAGNOSIS — M313 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: Secondary | ICD-10-CM | POA: Diagnosis not present

## 2017-07-26 DIAGNOSIS — M25511 Pain in right shoulder: Secondary | ICD-10-CM | POA: Diagnosis not present

## 2017-07-26 DIAGNOSIS — R293 Abnormal posture: Secondary | ICD-10-CM | POA: Diagnosis not present

## 2017-08-01 DIAGNOSIS — L821 Other seborrheic keratosis: Secondary | ICD-10-CM | POA: Diagnosis not present

## 2017-08-01 DIAGNOSIS — I8312 Varicose veins of left lower extremity with inflammation: Secondary | ICD-10-CM | POA: Diagnosis not present

## 2017-08-01 DIAGNOSIS — I8311 Varicose veins of right lower extremity with inflammation: Secondary | ICD-10-CM | POA: Diagnosis not present

## 2017-08-01 DIAGNOSIS — L570 Actinic keratosis: Secondary | ICD-10-CM | POA: Diagnosis not present

## 2017-08-01 DIAGNOSIS — D225 Melanocytic nevi of trunk: Secondary | ICD-10-CM | POA: Diagnosis not present

## 2017-08-03 DIAGNOSIS — M313 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: Secondary | ICD-10-CM | POA: Diagnosis not present

## 2017-08-03 DIAGNOSIS — M6281 Muscle weakness (generalized): Secondary | ICD-10-CM | POA: Diagnosis not present

## 2017-08-03 DIAGNOSIS — R293 Abnormal posture: Secondary | ICD-10-CM | POA: Diagnosis not present

## 2017-08-03 DIAGNOSIS — M25511 Pain in right shoulder: Secondary | ICD-10-CM | POA: Diagnosis not present

## 2017-08-06 DIAGNOSIS — M313 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: Secondary | ICD-10-CM | POA: Diagnosis not present

## 2017-08-06 DIAGNOSIS — M6281 Muscle weakness (generalized): Secondary | ICD-10-CM | POA: Diagnosis not present

## 2017-08-06 DIAGNOSIS — M25511 Pain in right shoulder: Secondary | ICD-10-CM | POA: Diagnosis not present

## 2017-08-06 DIAGNOSIS — R293 Abnormal posture: Secondary | ICD-10-CM | POA: Diagnosis not present

## 2017-08-09 DIAGNOSIS — M6281 Muscle weakness (generalized): Secondary | ICD-10-CM | POA: Diagnosis not present

## 2017-08-09 DIAGNOSIS — R293 Abnormal posture: Secondary | ICD-10-CM | POA: Diagnosis not present

## 2017-08-09 DIAGNOSIS — M313 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: Secondary | ICD-10-CM | POA: Diagnosis not present

## 2017-08-09 DIAGNOSIS — M25511 Pain in right shoulder: Secondary | ICD-10-CM | POA: Diagnosis not present

## 2017-08-16 DIAGNOSIS — M25511 Pain in right shoulder: Secondary | ICD-10-CM | POA: Diagnosis not present

## 2017-08-16 DIAGNOSIS — M6281 Muscle weakness (generalized): Secondary | ICD-10-CM | POA: Diagnosis not present

## 2017-08-16 DIAGNOSIS — M313 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: Secondary | ICD-10-CM | POA: Diagnosis not present

## 2017-08-16 DIAGNOSIS — R293 Abnormal posture: Secondary | ICD-10-CM | POA: Diagnosis not present

## 2017-08-22 DIAGNOSIS — M6281 Muscle weakness (generalized): Secondary | ICD-10-CM | POA: Diagnosis not present

## 2017-08-22 DIAGNOSIS — M25511 Pain in right shoulder: Secondary | ICD-10-CM | POA: Diagnosis not present

## 2017-08-22 DIAGNOSIS — R293 Abnormal posture: Secondary | ICD-10-CM | POA: Diagnosis not present

## 2017-08-22 DIAGNOSIS — M313 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: Secondary | ICD-10-CM | POA: Diagnosis not present

## 2017-08-28 DIAGNOSIS — M313 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: Secondary | ICD-10-CM | POA: Diagnosis not present

## 2017-08-28 DIAGNOSIS — R82998 Other abnormal findings in urine: Secondary | ICD-10-CM | POA: Diagnosis not present

## 2017-08-28 DIAGNOSIS — N390 Urinary tract infection, site not specified: Secondary | ICD-10-CM | POA: Diagnosis not present

## 2017-09-20 DIAGNOSIS — H2513 Age-related nuclear cataract, bilateral: Secondary | ICD-10-CM | POA: Diagnosis not present

## 2017-09-20 DIAGNOSIS — H524 Presbyopia: Secondary | ICD-10-CM | POA: Diagnosis not present

## 2017-09-20 DIAGNOSIS — H52203 Unspecified astigmatism, bilateral: Secondary | ICD-10-CM | POA: Diagnosis not present

## 2017-09-24 ENCOUNTER — Telehealth: Payer: Self-pay | Admitting: Certified Nurse Midwife

## 2017-09-24 NOTE — Telephone Encounter (Signed)
Left message on voicemail to call and reschedule cancelled appointment. °

## 2017-10-02 DIAGNOSIS — M313 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: Secondary | ICD-10-CM | POA: Diagnosis not present

## 2017-10-02 DIAGNOSIS — R6884 Jaw pain: Secondary | ICD-10-CM | POA: Diagnosis not present

## 2017-10-02 DIAGNOSIS — R7989 Other specified abnormal findings of blood chemistry: Secondary | ICD-10-CM | POA: Diagnosis not present

## 2017-10-02 DIAGNOSIS — H9201 Otalgia, right ear: Secondary | ICD-10-CM | POA: Diagnosis not present

## 2017-10-02 DIAGNOSIS — Z79899 Other long term (current) drug therapy: Secondary | ICD-10-CM | POA: Diagnosis not present

## 2017-11-09 ENCOUNTER — Ambulatory Visit: Payer: Medicare Other | Admitting: Certified Nurse Midwife

## 2017-11-15 ENCOUNTER — Ambulatory Visit: Payer: Medicare Other | Admitting: Certified Nurse Midwife

## 2017-11-19 DIAGNOSIS — D473 Essential (hemorrhagic) thrombocythemia: Secondary | ICD-10-CM | POA: Diagnosis not present

## 2017-11-19 DIAGNOSIS — D508 Other iron deficiency anemias: Secondary | ICD-10-CM | POA: Diagnosis not present

## 2017-11-19 DIAGNOSIS — D509 Iron deficiency anemia, unspecified: Secondary | ICD-10-CM | POA: Diagnosis not present

## 2017-11-19 DIAGNOSIS — D469 Myelodysplastic syndrome, unspecified: Secondary | ICD-10-CM | POA: Diagnosis not present

## 2017-11-23 ENCOUNTER — Other Ambulatory Visit: Payer: Self-pay | Admitting: Internal Medicine

## 2017-11-23 DIAGNOSIS — Z139 Encounter for screening, unspecified: Secondary | ICD-10-CM

## 2017-12-06 DIAGNOSIS — D509 Iron deficiency anemia, unspecified: Secondary | ICD-10-CM | POA: Diagnosis not present

## 2017-12-13 DIAGNOSIS — E559 Vitamin D deficiency, unspecified: Secondary | ICD-10-CM | POA: Diagnosis not present

## 2017-12-13 DIAGNOSIS — I10 Essential (primary) hypertension: Secondary | ICD-10-CM | POA: Diagnosis not present

## 2017-12-14 ENCOUNTER — Ambulatory Visit
Admission: RE | Admit: 2017-12-14 | Discharge: 2017-12-14 | Disposition: A | Discharge status: Home / Self Care | Payer: Medicare Other | Source: Ambulatory Visit | Attending: Internal Medicine | Admitting: Internal Medicine

## 2017-12-14 DIAGNOSIS — Z139 Encounter for screening, unspecified: Secondary | ICD-10-CM

## 2017-12-14 DIAGNOSIS — D649 Anemia, unspecified: Secondary | ICD-10-CM | POA: Diagnosis not present

## 2017-12-14 DIAGNOSIS — Z1231 Encounter for screening mammogram for malignant neoplasm of breast: Secondary | ICD-10-CM | POA: Diagnosis not present

## 2017-12-18 DIAGNOSIS — M313 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: Secondary | ICD-10-CM | POA: Diagnosis not present

## 2017-12-18 DIAGNOSIS — M810 Age-related osteoporosis without current pathological fracture: Secondary | ICD-10-CM | POA: Diagnosis not present

## 2017-12-18 DIAGNOSIS — Z0000 Encounter for general adult medical examination without abnormal findings: Secondary | ICD-10-CM | POA: Diagnosis not present

## 2017-12-18 DIAGNOSIS — I87319 Chronic venous hypertension (idiopathic) with ulcer of unspecified lower extremity: Secondary | ICD-10-CM | POA: Diagnosis not present

## 2017-12-18 DIAGNOSIS — I8310 Varicose veins of unspecified lower extremity with inflammation: Secondary | ICD-10-CM | POA: Diagnosis not present

## 2017-12-18 DIAGNOSIS — I480 Paroxysmal atrial fibrillation: Secondary | ICD-10-CM | POA: Diagnosis not present

## 2017-12-18 DIAGNOSIS — I8290 Acute embolism and thrombosis of unspecified vein: Secondary | ICD-10-CM | POA: Diagnosis not present

## 2017-12-18 DIAGNOSIS — Z6834 Body mass index (BMI) 34.0-34.9, adult: Secondary | ICD-10-CM | POA: Diagnosis not present

## 2017-12-18 DIAGNOSIS — I2699 Other pulmonary embolism without acute cor pulmonale: Secondary | ICD-10-CM | POA: Diagnosis not present

## 2017-12-18 DIAGNOSIS — M5136 Other intervertebral disc degeneration, lumbar region: Secondary | ICD-10-CM | POA: Diagnosis not present

## 2017-12-18 DIAGNOSIS — Z1389 Encounter for screening for other disorder: Secondary | ICD-10-CM | POA: Diagnosis not present

## 2017-12-18 DIAGNOSIS — D469 Myelodysplastic syndrome, unspecified: Secondary | ICD-10-CM | POA: Diagnosis not present

## 2018-01-09 ENCOUNTER — Ambulatory Visit: Payer: Medicare Other | Admitting: Certified Nurse Midwife

## 2018-01-09 ENCOUNTER — Telehealth: Payer: Self-pay | Admitting: Certified Nurse Midwife

## 2018-01-09 NOTE — Telephone Encounter (Signed)
Patient came in for her AEX today but decided to cancel at check-in. Separate staff message sent to provider.

## 2018-01-09 NOTE — Progress Notes (Unsigned)
67 y.o. G0P0000 Divorced  {Race/ethnicity:17218} Fe here for annual exam.    Patient's last menstrual period was 12/20/2002.          Sexually active: {yes no:314532}  The current method of family planning is post menopausal status.    Exercising: {yes no:314532}  {types:19826} Smoker:  {YES NO:22349}  Health Maintenance: Pap:  10-29-14 HPV HR neg, 11-07-16 neg History of Abnormal Pap: {YES NO:22349} MMG:  12-14-17 category b density birads 1:neg Self Breast exams: {YES NO:22349} Colonoscopy:  2016 neg per patient BMD:   2013 TDaP:  2012 Shingles: PCP Pneumonia: 2015 Hep C and HIV: *** Labs: ***   reports that she has never smoked. She has never used smokeless tobacco. She reports that she does not drink alcohol or use drugs.  Past Medical History:  Diagnosis Date  . Allergic rhinitis   . Anemia    recurrent iron defic.  Marland Kitchen Cervical polyp 03/2003  . DDD (degenerative disc disease)   . Depression    Dr. Toy Care (situational, divorce, loss of parents)  . Diverticulosis   . DVT (deep venous thrombosis) (HCC)    both legs  . Esophageal stricture   . GERD (gastroesophageal reflux disease)   . Hiatal hernia   . Hyperlipidemia   . Hypertension   . Hypertension   . Neuropathy, peripheral ([place])   . OSA (obstructive sleep apnea)   . Osteoarthritis   . Pulmonary embolism ([place])    left lung  . Wegner's disease (congenital syphilitic osteochondritis) 2012   DR. Ginger Organ at [facility]    Past Surgical History:  Procedure Laterality Date  . back injection     steroid injection x2  . BRONCHOSCOPY  april 2012  . iron infusion  11/14   seeing hematologist at [facility]  . lung biospy  may 2012  . PARTIAL HIP ARTHROPLASTY Left 03/2014    Current Outpatient Medications  Medication Sig Dispense Refill  . acetaminophen (TYLENOL) 325 MG tablet Take 650 mg by mouth as needed.      Marland Kitchen apixaban (ELIQUIS) 5 MG TABS tablet Take 5 mg by mouth 2 (two) times daily.    Marland Kitchen azaTHIOprine (IMURAN) 50 MG tablet  Take 150 mg by mouth daily.     . calcium carbonate (OS-CAL) 600 MG TABS tablet Take by mouth.    . Cholecalciferol (VITAMIN D PO) Take by mouth daily.    . DULoxetine (CYMBALTA) 60 MG capsule Take 60 mg by mouth at bedtime.    . ferrous sulfate 325 (65 FE) MG tablet Take 325 mg by mouth 2 (two) times daily. Reported on 11/02/2015    . fish oil-omega-3 fatty acids 1000 MG capsule Take 2 g by mouth daily.    . hydrochlorothiazide (MICROZIDE) 12.5 MG capsule Take 12.5 mg by mouth daily.     . hydroxyurea (HYDREA) 500 MG capsule Take 1,000 mg by mouth daily.     Marland Kitchen ibandronate (BONIVA) 150 MG tablet Take by mouth.    Marland Kitchen lisinopril (PRINIVIL,ZESTRIL) 10 MG tablet Take 10 mg by mouth daily.      . Multiple Vitamin (MULTIVITAMIN) tablet Take 1 tablet by mouth daily.      Marland Kitchen nystatin (MYCOSTATIN/NYSTOP) 100000 UNIT/GM POWD Reported on 11/02/2015    . traMADol (ULTRAM) 50 MG tablet Take 100 mg by mouth every 6 (six) hours as needed.      . triamcinolone cream (KENALOG) 0.1 % Apply topically 2 (two) times daily.     No current facility-administered medications  for this visit.     Family History  Problem Relation Age of Onset  . Hypertension Mother   . Neuropathy Mother   . Varicose Veins Mother   . Stroke Father   . Lung cancer Father   . Arthritis Father   . Cancer Father   . Deep vein thrombosis Father   . Hypertension Father   . Varicose Veins Father   . Diabetes Maternal Grandfather   . Diabetes Maternal Uncle   . Diabetes Maternal Aunt   . Hypertension Sister   . Colon cancer Neg Hx     ROS:  Pertinent items are noted in HPI.  Otherwise, a comprehensive ROS was negative.  Exam:   LMP 12/20/2002    Ht Readings from Last 3 Encounters:  11/07/16 5' 0.75" (1.543 m)  11/02/15 5\' 2"  (1.575 m)  10/29/14 5\' 2"  (1.575 m)    General appearance: alert, cooperative and appears stated age Head: Normocephalic, without obvious abnormality, atraumatic Neck: no adenopathy, supple, symmetrical,  trachea midline and thyroid {EXAM; THYROID:18604} Lungs: clear to auscultation bilaterally Breasts: {Exam; breast:13139::"normal appearance, no masses or tenderness"} Heart: regular rate and rhythm Abdomen: soft, non-tender; no masses,  no organomegaly Extremities: extremities normal, atraumatic, no cyanosis or edema Skin: Skin color, texture, turgor normal. No rashes or lesions Lymph nodes: Cervical, supraclavicular, and axillary nodes normal. No abnormal inguinal nodes palpated Neurologic: Grossly normal   Pelvic: External genitalia:  no lesions              Urethra:  normal appearing urethra with no masses, tenderness or lesions              Bartholin's and Skene's: normal                 Vagina: normal appearing vagina with normal color and discharge, no lesions              Cervix: {exam; cervix:14595}              Pap taken: {yes no:314532} Bimanual Exam:  Uterus:  {exam; uterus:12215}              Adnexa: {exam; adnexa:12223}               Rectovaginal: Confirms               Anus:  normal sphincter tone, no lesions  Chaperone present: ***  A:  Well Woman with normal exam  P:   Reviewed health and wellness pertinent to exam  Pap smear: {YES NO:22349}  {plan; gyn:5269::"mammogram","pap smear","return annually or prn"}  An After Visit Summary was printed and given to the patient.

## 2018-01-10 DIAGNOSIS — M313 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: Secondary | ICD-10-CM | POA: Diagnosis not present

## 2018-01-10 DIAGNOSIS — R6884 Jaw pain: Secondary | ICD-10-CM | POA: Diagnosis not present

## 2018-01-10 DIAGNOSIS — D509 Iron deficiency anemia, unspecified: Secondary | ICD-10-CM | POA: Diagnosis not present

## 2018-01-10 DIAGNOSIS — Z79899 Other long term (current) drug therapy: Secondary | ICD-10-CM | POA: Diagnosis not present

## 2018-01-10 DIAGNOSIS — R600 Localized edema: Secondary | ICD-10-CM | POA: Diagnosis not present

## 2018-01-15 DIAGNOSIS — M545 Low back pain: Secondary | ICD-10-CM | POA: Diagnosis not present

## 2018-01-15 DIAGNOSIS — R262 Difficulty in walking, not elsewhere classified: Secondary | ICD-10-CM | POA: Diagnosis not present

## 2018-01-15 DIAGNOSIS — M79605 Pain in left leg: Secondary | ICD-10-CM | POA: Diagnosis not present

## 2018-01-15 DIAGNOSIS — M6281 Muscle weakness (generalized): Secondary | ICD-10-CM | POA: Diagnosis not present

## 2018-01-18 DIAGNOSIS — L570 Actinic keratosis: Secondary | ICD-10-CM | POA: Diagnosis not present

## 2018-01-18 DIAGNOSIS — L52 Erythema nodosum: Secondary | ICD-10-CM | POA: Diagnosis not present

## 2018-01-21 DIAGNOSIS — M6281 Muscle weakness (generalized): Secondary | ICD-10-CM | POA: Diagnosis not present

## 2018-01-21 DIAGNOSIS — M79605 Pain in left leg: Secondary | ICD-10-CM | POA: Diagnosis not present

## 2018-01-21 DIAGNOSIS — R262 Difficulty in walking, not elsewhere classified: Secondary | ICD-10-CM | POA: Diagnosis not present

## 2018-01-21 DIAGNOSIS — M545 Low back pain: Secondary | ICD-10-CM | POA: Diagnosis not present

## 2018-01-28 DIAGNOSIS — M545 Low back pain: Secondary | ICD-10-CM | POA: Diagnosis not present

## 2018-01-28 DIAGNOSIS — R262 Difficulty in walking, not elsewhere classified: Secondary | ICD-10-CM | POA: Diagnosis not present

## 2018-01-28 DIAGNOSIS — M6281 Muscle weakness (generalized): Secondary | ICD-10-CM | POA: Diagnosis not present

## 2018-01-28 DIAGNOSIS — M79605 Pain in left leg: Secondary | ICD-10-CM | POA: Diagnosis not present

## 2018-03-12 DIAGNOSIS — L52 Erythema nodosum: Secondary | ICD-10-CM | POA: Diagnosis not present

## 2018-03-12 DIAGNOSIS — L218 Other seborrheic dermatitis: Secondary | ICD-10-CM | POA: Diagnosis not present

## 2018-03-26 DIAGNOSIS — D508 Other iron deficiency anemias: Secondary | ICD-10-CM | POA: Diagnosis not present

## 2018-03-26 DIAGNOSIS — D469 Myelodysplastic syndrome, unspecified: Secondary | ICD-10-CM | POA: Diagnosis not present

## 2018-03-26 DIAGNOSIS — D473 Essential (hemorrhagic) thrombocythemia: Secondary | ICD-10-CM | POA: Diagnosis not present

## 2018-03-29 ENCOUNTER — Other Ambulatory Visit: Payer: Self-pay

## 2018-03-29 DIAGNOSIS — I83893 Varicose veins of bilateral lower extremities with other complications: Secondary | ICD-10-CM

## 2018-04-02 DIAGNOSIS — R609 Edema, unspecified: Secondary | ICD-10-CM | POA: Diagnosis not present

## 2018-04-02 DIAGNOSIS — I10 Essential (primary) hypertension: Secondary | ICD-10-CM | POA: Diagnosis not present

## 2018-04-02 DIAGNOSIS — R600 Localized edema: Secondary | ICD-10-CM | POA: Diagnosis not present

## 2018-04-02 DIAGNOSIS — B999 Unspecified infectious disease: Secondary | ICD-10-CM | POA: Diagnosis not present

## 2018-04-08 DIAGNOSIS — M313 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: Secondary | ICD-10-CM | POA: Diagnosis not present

## 2018-04-08 DIAGNOSIS — M150 Primary generalized (osteo)arthritis: Secondary | ICD-10-CM | POA: Diagnosis not present

## 2018-04-08 DIAGNOSIS — D469 Myelodysplastic syndrome, unspecified: Secondary | ICD-10-CM | POA: Diagnosis not present

## 2018-04-10 DIAGNOSIS — D509 Iron deficiency anemia, unspecified: Secondary | ICD-10-CM | POA: Diagnosis not present

## 2018-04-18 DIAGNOSIS — I87319 Chronic venous hypertension (idiopathic) with ulcer of unspecified lower extremity: Secondary | ICD-10-CM | POA: Diagnosis not present

## 2018-04-18 DIAGNOSIS — M25519 Pain in unspecified shoulder: Secondary | ICD-10-CM | POA: Diagnosis not present

## 2018-04-18 DIAGNOSIS — Z6832 Body mass index (BMI) 32.0-32.9, adult: Secondary | ICD-10-CM | POA: Diagnosis not present

## 2018-04-23 ENCOUNTER — Ambulatory Visit (INDEPENDENT_AMBULATORY_CARE_PROVIDER_SITE_OTHER): Payer: Medicare Other | Admitting: Vascular Surgery

## 2018-04-23 ENCOUNTER — Encounter: Payer: Self-pay | Admitting: Vascular Surgery

## 2018-04-23 ENCOUNTER — Ambulatory Visit (HOSPITAL_COMMUNITY)
Admission: RE | Admit: 2018-04-23 | Discharge: 2018-04-23 | Disposition: A | Discharge status: Home / Self Care | Payer: Medicare Other | Source: Ambulatory Visit | Attending: Vascular Surgery | Admitting: Vascular Surgery

## 2018-04-23 ENCOUNTER — Other Ambulatory Visit: Payer: Self-pay

## 2018-04-23 VITALS — BP 115/78 | HR 93 | Temp 99.2°F | Resp 16 | Ht 62.5 in | Wt 180.0 lb

## 2018-04-23 DIAGNOSIS — I872 Venous insufficiency (chronic) (peripheral): Secondary | ICD-10-CM | POA: Diagnosis not present

## 2018-04-23 DIAGNOSIS — I83893 Varicose veins of bilateral lower extremities with other complications: Secondary | ICD-10-CM | POA: Insufficient documentation

## 2018-04-23 NOTE — Progress Notes (Signed)
Patient name: [name] MRN: [id number] DOB: [birth year] Sex: female  REASON FOR CONSULT: Swelling and edema bilateral lower extremities with leg pain  HPI: [name] is a 67 y.o. 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  Patient states she has been dealing with leg swelling for years as well as pain in both of her calves.  Worse after standing for long periods of time.  Somewhat improved with compression.  She has been wearing knee-high compression since seeing Dr. Bridgett Larsson in 2015.  She never followed up after starting to wear compression since she states there were more pertinent issues.  She has had several recent episodes of cellulitis of her left lower extremity.  She states the pain and swelling is now somewhat improved since starting steroids for an arthritis flare.  She said no previous lower extremity venous interventions.  Her Wegener's is managed at [facility] and her erythema nodosum is managed by Dr. Pearline Cables with dermatology here.  Past Medical History:  Diagnosis Date  . Allergic rhinitis   . Anemia    recurrent iron defic.  Marland Kitchen Cervical polyp 03/2003  . DDD (degenerative disc disease)   . Depression    Dr. Toy Care (situational, divorce, loss of parents)  . Diverticulosis   . DVT (deep venous thrombosis) (HCC)    both legs  . Esophageal stricture   . GERD (gastroesophageal reflux disease)   . Hiatal hernia   . Hyperlipidemia   . Hypertension   . Hypertension   . Neuropathy, peripheral   . OSA (obstructive sleep apnea)   . Osteoarthritis   . Pulmonary embolism ([place])    left lung  . Wegner's disease (congenital syphilitic osteochondritis) 2012   DR. Ginger Organ at [facility]    Past Surgical History:  Procedure Laterality Date  . back injection     steroid injection x2  .  BRONCHOSCOPY  april 2012  . iron infusion  11/14   seeing hematologist at [facility]  . lung biospy  may 2012  . PARTIAL HIP ARTHROPLASTY Left 03/2014    Family History  Problem Relation Age of Onset  . Hypertension Mother   . Neuropathy Mother   . Varicose Veins Mother   . Stroke Father   . Lung cancer Father   . Arthritis Father   . Cancer Father   . Deep vein thrombosis Father   . Hypertension Father   . Varicose Veins Father   . Diabetes Maternal Grandfather   . Diabetes Maternal Uncle   . Diabetes Maternal Aunt   . Hypertension Sister   . Colon cancer Neg Hx     SOCIAL HISTORY: Social History   Socioeconomic History  . Marital status: Divorced    Spouse name: Not on file  . Number of children: N  . Years of education: Not on file  . Highest education level: Not on file  Occupational History  . 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 Financial resource strain: Not on file  . Food insecurity:    Worry: Not on file    Inability: Not on file  . Transportation needs:    Medical: Not on file    Non-medical: Not on file  Tobacco Use  . Smoking status: Never Smoker  .  Smokeless tobacco: Never Used  Substance and Sexual Activity  . Alcohol use: No    Comment: rare  . Drug use: No  . Sexual activity: Yes    Partners: Male    Birth control/protection: Post-menopausal  Lifestyle  . Physical activity:    Days per week: Not on file    Minutes per session: Not on file  . Stress: Not on file  Relationships  . Social connections:    Talks on phone: Not on file    Gets together: Not on file    Attends religious service: Not on file    Active member of club or organization: Not on file    Attends meetings of clubs or organizations: Not on file    Relationship status: Not on file  . Intimate partner violence:    Fear of current or ex partner: Not on file    Emotionally abused: Not on file     Physically abused: Not on file    Forced sexual activity: Not on file  Other Topics Concern  . Not on file  Social History Narrative   Regular exercise= treadmill 4x/week for 30 mins   Weight watchers.    Lives alone.  Caffeine daily-coffee    Allergies  Allergen Reactions  . Latex     Sensitive to latex, rash  . Other     Adhesive tape causes rash  . Pregabalin Swelling    Swelling of hands and feet  . Codeine     REACTION: itching    Current Outpatient Medications  Medication Sig Dispense Refill  . acetaminophen (TYLENOL) 325 MG tablet Take 650 mg by mouth as needed.      Marland Kitchen apixaban (ELIQUIS) 5 MG TABS tablet Take 5 mg by mouth 2 (two) times daily.    . calcium carbonate (OS-CAL) 600 MG TABS tablet Take by mouth.    . Cholecalciferol (VITAMIN D PO) Take by mouth daily.    . DULoxetine (CYMBALTA) 60 MG capsule Take 60 mg by mouth at bedtime.    . ferrous sulfate 325 (65 FE) MG tablet Take 325 mg by mouth 2 (two) times daily. Reported on 11/02/2015    . fish oil-omega-3 fatty acids 1000 MG capsule Take 2 g by mouth daily.    . hydrochlorothiazide (MICROZIDE) 12.5 MG capsule Take 12.5 mg by mouth daily.     . hydroxyurea (HYDREA) 500 MG capsule Take 1,000 mg by mouth daily.     Marland Kitchen ibandronate (BONIVA) 150 MG tablet Take by mouth.    Marland Kitchen lisinopril (PRINIVIL,ZESTRIL) 10 MG tablet Take 10 mg by mouth daily.      . Multiple Vitamin (MULTIVITAMIN) tablet Take 1 tablet by mouth daily.      Marland Kitchen nystatin (MYCOSTATIN/NYSTOP) 100000 UNIT/GM POWD Reported on 11/02/2015    . traMADol (ULTRAM) 50 MG tablet Take 100 mg by mouth every 6 (six) hours as needed.      . triamcinolone cream (KENALOG) 0.1 % Apply topically 2 (two) times daily.     No current facility-administered medications for this visit.     REVIEW OF SYSTEMS:  [X]  denotes positive finding, [ ]  denotes negative finding Cardiac  Comments:  Chest pain or chest pressure:    Shortness of breath upon exertion:    Short of breath  when lying flat:    Irregular heart rhythm:        Vascular    Pain in calf, thigh, or hip brought on by ambulation:  Pain in feet at night that wakes you up from your sleep:     Blood clot in your veins: x   Leg swelling:  x       Pulmonary    Oxygen at home:    Productive cough:     Wheezing:         Neurologic    Sudden weakness in arms or legs:     Sudden numbness in arms or legs:     Sudden onset of difficulty speaking or slurred speech:    Temporary loss of vision in one eye:     Problems with dizziness:         Gastrointestinal    Blood in stool:     Vomited blood:         Genitourinary    Burning when urinating:     Blood in urine:        Psychiatric    Major depression:         Hematologic    Bleeding problems:    Problems with blood clotting too easily:        Skin    Rashes or ulcers:        Constitutional    Fever or chills:      PHYSICAL EXAM: Vitals:   04/23/18 1125  BP: 115/78  Pulse: 93  Resp: 16  Temp: 99.2 F (37.3 C)  TempSrc: Oral  SpO2: 96%  Weight: 81.6 kg  Height: 5' 2.5" (1.588 m)    GENERAL: The patient is a well-nourished female, in no acute distress. The vital signs are documented above. CARDIAC: There is a regular rate and rhythm.  VASCULAR: 2+radial pulse bilateral upper extremity palpable 2+ femoral pulse bilateral groins palpable 2+ dorsalis pedis on the right and 2+ posterior tibial on the left palpable Patient has swelling of her bilateral lower extremities with pigmentation and skin changes of the bilateral lower extremities CEAP clinical class C4 PULMONARY: There is good air exchange bilaterally without wheezing or rales. ABDOMEN: Soft and non-tender with normal pitched bowel sounds.  MUSCULOSKELETAL: There are no major deformities or cyanosis. NEUROLOGIC: No focal weakness or paresthesias are detected. SKIN: There are no ulcers or rashes noted. PSYCHIATRIC: The patient has a normal affect.  DATA:   I have  independently reviewed her lower extremity venous reflux study: On the right she has abnormal reflux times in the popliteal vein.  On the left she has abnormal reflux times in the common femoral vein as well as the saphenofemoral junction and the proximal great saphenous vein.  Assessment/Plan:  67 year old female who presents with bilateral lower extremity leg swelling as well as pain in her calves.  I reviewed with the patient that given abnormal reflux times only in the deep system in the right leg we would recommend compression alone.  She is already in knee-high compression and states she cannot tolerate thigh-high compression.  On the left she does have some abnormal reflux times in the common femoral vein as well as the saphenofemoral junction and proximal great saphenous vein.  I told her I am concerned given her extensive vasculitis history as well as her history of bilateral lower extremity DVTs on Eliquis as to whether she should entertain any intervention and may be best served with compression alone.  In addition, her left saphenous vein in the upper thigh is small and only about 4 mm and may not be amendable to any intervention.  She would like to come back  in 3 months to be re-evaluated once more after documented compression therapy.  Will plan to have her see Dr. Oneida Alar or Dr. Scot Dock for one final opinion after 3 months of compression.   Marty Heck, MD [facility] Office: [phone] Pager: [place]

## 2018-04-24 DIAGNOSIS — Z6832 Body mass index (BMI) 32.0-32.9, adult: Secondary | ICD-10-CM | POA: Diagnosis not present

## 2018-04-24 DIAGNOSIS — I87319 Chronic venous hypertension (idiopathic) with ulcer of unspecified lower extremity: Secondary | ICD-10-CM | POA: Diagnosis not present

## 2018-04-27 DIAGNOSIS — Z23 Encounter for immunization: Secondary | ICD-10-CM | POA: Diagnosis not present

## 2018-05-03 DIAGNOSIS — Z4800 Encounter for change or removal of nonsurgical wound dressing: Secondary | ICD-10-CM | POA: Diagnosis not present

## 2018-05-03 DIAGNOSIS — Z6832 Body mass index (BMI) 32.0-32.9, adult: Secondary | ICD-10-CM | POA: Diagnosis not present

## 2018-05-03 DIAGNOSIS — I87319 Chronic venous hypertension (idiopathic) with ulcer of unspecified lower extremity: Secondary | ICD-10-CM | POA: Diagnosis not present

## 2018-05-10 DIAGNOSIS — Z4800 Encounter for change or removal of nonsurgical wound dressing: Secondary | ICD-10-CM | POA: Diagnosis not present

## 2018-05-10 DIAGNOSIS — I87319 Chronic venous hypertension (idiopathic) with ulcer of unspecified lower extremity: Secondary | ICD-10-CM | POA: Diagnosis not present

## 2018-05-10 DIAGNOSIS — I8310 Varicose veins of unspecified lower extremity with inflammation: Secondary | ICD-10-CM | POA: Diagnosis not present

## 2018-05-10 DIAGNOSIS — Z6834 Body mass index (BMI) 34.0-34.9, adult: Secondary | ICD-10-CM | POA: Diagnosis not present

## 2018-05-17 DIAGNOSIS — I87319 Chronic venous hypertension (idiopathic) with ulcer of unspecified lower extremity: Secondary | ICD-10-CM | POA: Diagnosis not present

## 2018-05-17 DIAGNOSIS — Z6832 Body mass index (BMI) 32.0-32.9, adult: Secondary | ICD-10-CM | POA: Diagnosis not present

## 2018-05-17 DIAGNOSIS — Z4800 Encounter for change or removal of nonsurgical wound dressing: Secondary | ICD-10-CM | POA: Diagnosis not present

## 2018-05-17 DIAGNOSIS — M67949 Unspecified disorder of synovium and tendon, unspecified hand: Secondary | ICD-10-CM | POA: Diagnosis not present

## 2018-05-17 DIAGNOSIS — R2242 Localized swelling, mass and lump, left lower limb: Secondary | ICD-10-CM | POA: Diagnosis not present

## 2018-05-17 DIAGNOSIS — L52 Erythema nodosum: Secondary | ICD-10-CM | POA: Diagnosis not present

## 2018-05-21 DIAGNOSIS — L52 Erythema nodosum: Secondary | ICD-10-CM | POA: Diagnosis not present

## 2018-05-24 ENCOUNTER — Other Ambulatory Visit: Payer: Self-pay | Admitting: Dermatology

## 2018-05-24 DIAGNOSIS — Z4800 Encounter for change or removal of nonsurgical wound dressing: Secondary | ICD-10-CM | POA: Diagnosis not present

## 2018-05-24 DIAGNOSIS — M7989 Other specified soft tissue disorders: Secondary | ICD-10-CM | POA: Diagnosis not present

## 2018-05-24 DIAGNOSIS — L52 Erythema nodosum: Secondary | ICD-10-CM | POA: Diagnosis not present

## 2018-05-24 DIAGNOSIS — I87319 Chronic venous hypertension (idiopathic) with ulcer of unspecified lower extremity: Secondary | ICD-10-CM | POA: Diagnosis not present

## 2018-06-06 DIAGNOSIS — N3941 Urge incontinence: Secondary | ICD-10-CM | POA: Diagnosis not present

## 2018-06-06 DIAGNOSIS — R350 Frequency of micturition: Secondary | ICD-10-CM | POA: Diagnosis not present

## 2018-06-07 DIAGNOSIS — Z4802 Encounter for removal of sutures: Secondary | ICD-10-CM | POA: Diagnosis not present

## 2018-06-13 DIAGNOSIS — M313 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: Secondary | ICD-10-CM | POA: Diagnosis not present

## 2018-06-13 DIAGNOSIS — K219 Gastro-esophageal reflux disease without esophagitis: Secondary | ICD-10-CM | POA: Diagnosis not present

## 2018-06-13 DIAGNOSIS — Z79899 Other long term (current) drug therapy: Secondary | ICD-10-CM | POA: Diagnosis not present

## 2018-06-13 DIAGNOSIS — I52 Other heart disorders in diseases classified elsewhere: Secondary | ICD-10-CM | POA: Diagnosis not present

## 2018-06-13 DIAGNOSIS — M150 Primary generalized (osteo)arthritis: Secondary | ICD-10-CM | POA: Diagnosis not present

## 2018-06-13 DIAGNOSIS — I872 Venous insufficiency (chronic) (peripheral): Secondary | ICD-10-CM | POA: Diagnosis not present

## 2018-06-13 DIAGNOSIS — D471 Chronic myeloproliferative disease: Secondary | ICD-10-CM | POA: Diagnosis not present

## 2018-06-13 DIAGNOSIS — M8589 Other specified disorders of bone density and structure, multiple sites: Secondary | ICD-10-CM | POA: Diagnosis not present

## 2018-06-18 DIAGNOSIS — D469 Myelodysplastic syndrome, unspecified: Secondary | ICD-10-CM | POA: Diagnosis not present

## 2018-06-18 DIAGNOSIS — I480 Paroxysmal atrial fibrillation: Secondary | ICD-10-CM | POA: Diagnosis not present

## 2018-06-18 DIAGNOSIS — M313 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: Secondary | ICD-10-CM | POA: Diagnosis not present

## 2018-06-18 DIAGNOSIS — I2699 Other pulmonary embolism without acute cor pulmonale: Secondary | ICD-10-CM | POA: Diagnosis not present

## 2018-06-18 DIAGNOSIS — I10 Essential (primary) hypertension: Secondary | ICD-10-CM | POA: Diagnosis not present

## 2018-07-04 DIAGNOSIS — M8589 Other specified disorders of bone density and structure, multiple sites: Secondary | ICD-10-CM | POA: Diagnosis not present

## 2018-07-04 DIAGNOSIS — I517 Cardiomegaly: Secondary | ICD-10-CM | POA: Diagnosis not present

## 2018-07-04 DIAGNOSIS — I52 Other heart disorders in diseases classified elsewhere: Secondary | ICD-10-CM | POA: Diagnosis not present

## 2018-07-04 DIAGNOSIS — K219 Gastro-esophageal reflux disease without esophagitis: Secondary | ICD-10-CM | POA: Diagnosis not present

## 2018-07-04 DIAGNOSIS — I872 Venous insufficiency (chronic) (peripheral): Secondary | ICD-10-CM | POA: Diagnosis not present

## 2018-07-04 DIAGNOSIS — M313 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: Secondary | ICD-10-CM | POA: Diagnosis not present

## 2018-07-04 DIAGNOSIS — M150 Primary generalized (osteo)arthritis: Secondary | ICD-10-CM | POA: Diagnosis not present

## 2018-07-04 DIAGNOSIS — D471 Chronic myeloproliferative disease: Secondary | ICD-10-CM | POA: Diagnosis not present

## 2018-07-08 DIAGNOSIS — K51019 Ulcerative (chronic) pancolitis with unspecified complications: Secondary | ICD-10-CM | POA: Diagnosis not present

## 2018-07-22 DIAGNOSIS — I8311 Varicose veins of right lower extremity with inflammation: Secondary | ICD-10-CM | POA: Diagnosis not present

## 2018-07-22 DIAGNOSIS — I8312 Varicose veins of left lower extremity with inflammation: Secondary | ICD-10-CM | POA: Diagnosis not present

## 2018-07-24 ENCOUNTER — Ambulatory Visit: Payer: Medicare Other | Admitting: Vascular Surgery

## 2018-07-24 DIAGNOSIS — L52 Erythema nodosum: Secondary | ICD-10-CM | POA: Diagnosis not present

## 2018-07-24 DIAGNOSIS — I8310 Varicose veins of unspecified lower extremity with inflammation: Secondary | ICD-10-CM | POA: Diagnosis not present

## 2018-07-24 DIAGNOSIS — I10 Essential (primary) hypertension: Secondary | ICD-10-CM | POA: Diagnosis not present

## 2018-07-24 DIAGNOSIS — I87319 Chronic venous hypertension (idiopathic) with ulcer of unspecified lower extremity: Secondary | ICD-10-CM | POA: Diagnosis not present

## 2018-07-24 DIAGNOSIS — Z6831 Body mass index (BMI) 31.0-31.9, adult: Secondary | ICD-10-CM | POA: Diagnosis not present

## 2018-07-24 DIAGNOSIS — M313 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: Secondary | ICD-10-CM | POA: Diagnosis not present

## 2018-07-29 DIAGNOSIS — I8311 Varicose veins of right lower extremity with inflammation: Secondary | ICD-10-CM | POA: Diagnosis not present

## 2018-07-31 ENCOUNTER — Ambulatory Visit: Payer: Medicare Other | Admitting: Vascular Surgery

## 2018-08-01 DIAGNOSIS — D508 Other iron deficiency anemias: Secondary | ICD-10-CM | POA: Diagnosis not present

## 2018-08-01 DIAGNOSIS — D469 Myelodysplastic syndrome, unspecified: Secondary | ICD-10-CM | POA: Diagnosis not present

## 2018-08-01 DIAGNOSIS — D638 Anemia in other chronic diseases classified elsewhere: Secondary | ICD-10-CM | POA: Diagnosis not present

## 2018-08-01 DIAGNOSIS — D473 Essential (hemorrhagic) thrombocythemia: Secondary | ICD-10-CM | POA: Diagnosis not present

## 2018-08-19 DIAGNOSIS — I8310 Varicose veins of unspecified lower extremity with inflammation: Secondary | ICD-10-CM | POA: Diagnosis not present

## 2018-08-23 DIAGNOSIS — D469 Myelodysplastic syndrome, unspecified: Secondary | ICD-10-CM | POA: Diagnosis not present

## 2018-08-23 DIAGNOSIS — R000 Tachycardia, unspecified: Secondary | ICD-10-CM | POA: Diagnosis not present

## 2018-08-23 DIAGNOSIS — I10 Essential (primary) hypertension: Secondary | ICD-10-CM | POA: Diagnosis not present

## 2018-08-23 DIAGNOSIS — Z7901 Long term (current) use of anticoagulants: Secondary | ICD-10-CM | POA: Diagnosis not present

## 2018-08-23 DIAGNOSIS — I480 Paroxysmal atrial fibrillation: Secondary | ICD-10-CM | POA: Diagnosis not present

## 2018-08-23 DIAGNOSIS — Z6830 Body mass index (BMI) 30.0-30.9, adult: Secondary | ICD-10-CM | POA: Diagnosis not present

## 2018-08-27 DIAGNOSIS — D509 Iron deficiency anemia, unspecified: Secondary | ICD-10-CM | POA: Diagnosis not present

## 2018-09-02 DIAGNOSIS — I87319 Chronic venous hypertension (idiopathic) with ulcer of unspecified lower extremity: Secondary | ICD-10-CM | POA: Diagnosis not present

## 2018-09-02 DIAGNOSIS — I8310 Varicose veins of unspecified lower extremity with inflammation: Secondary | ICD-10-CM | POA: Diagnosis not present

## 2018-09-02 DIAGNOSIS — D509 Iron deficiency anemia, unspecified: Secondary | ICD-10-CM | POA: Diagnosis not present

## 2018-09-02 DIAGNOSIS — Z6830 Body mass index (BMI) 30.0-30.9, adult: Secondary | ICD-10-CM | POA: Diagnosis not present

## 2018-09-02 DIAGNOSIS — I480 Paroxysmal atrial fibrillation: Secondary | ICD-10-CM | POA: Diagnosis not present

## 2018-09-09 DIAGNOSIS — I87319 Chronic venous hypertension (idiopathic) with ulcer of unspecified lower extremity: Secondary | ICD-10-CM | POA: Diagnosis not present

## 2018-09-09 DIAGNOSIS — Z6830 Body mass index (BMI) 30.0-30.9, adult: Secondary | ICD-10-CM | POA: Diagnosis not present

## 2018-09-09 DIAGNOSIS — I8310 Varicose veins of unspecified lower extremity with inflammation: Secondary | ICD-10-CM | POA: Diagnosis not present

## 2018-09-12 ENCOUNTER — Encounter: Payer: Medicare Other | Attending: Physician Assistant | Admitting: Physician Assistant

## 2018-09-12 DIAGNOSIS — Z7901 Long term (current) use of anticoagulants: Secondary | ICD-10-CM | POA: Diagnosis not present

## 2018-09-12 DIAGNOSIS — I10 Essential (primary) hypertension: Secondary | ICD-10-CM | POA: Insufficient documentation

## 2018-09-12 DIAGNOSIS — M1990 Unspecified osteoarthritis, unspecified site: Secondary | ICD-10-CM | POA: Diagnosis not present

## 2018-09-12 DIAGNOSIS — Z86718 Personal history of other venous thrombosis and embolism: Secondary | ICD-10-CM | POA: Insufficient documentation

## 2018-09-12 DIAGNOSIS — M313 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: Secondary | ICD-10-CM | POA: Diagnosis not present

## 2018-09-12 DIAGNOSIS — I480 Paroxysmal atrial fibrillation: Secondary | ICD-10-CM | POA: Diagnosis not present

## 2018-09-12 DIAGNOSIS — L97812 Non-pressure chronic ulcer of other part of right lower leg with fat layer exposed: Secondary | ICD-10-CM | POA: Insufficient documentation

## 2018-09-12 DIAGNOSIS — M359 Systemic involvement of connective tissue, unspecified: Secondary | ICD-10-CM | POA: Diagnosis not present

## 2018-09-13 NOTE — Progress Notes (Signed)
Joyce, Kaufman ([id number]) Visit Report for 09/12/2018 Abuse/Suicide Risk Screen Details Patient Name: [name], [name]. Date of Service: 09/12/2018 10:15 AM Medical Record Number: [id number] Patient Account Number: [id number] Date of Birth/Sex: [birth year] (68 y.o. Female) Treating RN: Montey Hora Primary Care Jamarquis Crull: Crist Infante Other Clinician: Referring Baird Polinski: Reginold Agent Treating Sharayah Renfrow/Extender: Melburn Hake, HOYT Weeks in Treatment: 0 Abuse/Suicide Risk Screen Items Answer ABUSE/SUICIDE RISK SCREEN: Has anyone close to you tried to hurt or harm you recentlyo No Do you feel uncomfortable with anyone in your familyo No Has anyone forced you do things that you didnot want to doo No Do you have any thoughts of harming yourselfo No Patient displays signs or symptoms of abuse and/or neglect. No Electronic Signature(s) Signed: 09/12/2018 5:09:04 PM By: Montey Hora Entered By: Montey Hora on 09/12/2018 10:44:05 [name] ([id number]) -------------------------------------------------------------------------------- Activities of Daily Living Details Patient Name: Joyce, Kaufman. Date of Service: 09/12/2018 10:15 AM Medical Record Number: [id number] Patient Account Number: [id number] Date of Birth/Sex: 09/18/1950 (68 y.o. Female) Treating RN: Montey Hora Primary Care Samani Deal: Crist Infante Other Clinician: Referring Kholton Coate: Reginold Agent Treating Nivea Wojdyla/Extender: Melburn Hake, HOYT Weeks in Treatment: 0 Activities of Daily Living Items Answer Activities of Daily Living (Please select one for each item) Drive Automobile Completely Able Take Medications Completely Able Use Telephone Completely Able Care for Appearance Completely Able Use Toilet Completely Able Bath / Shower Completely Able Dress Self Completely Able Feed Self Completely Able Walk Completely Able Get In / Out Bed Completely Able Housework Completely Able Prepare Meals Completely  Able Handle Money Completely Able Shop for Self Completely Able Electronic Signature(s) Signed: 09/12/2018 5:09:04 PM By: Montey Hora Entered By: Montey Hora on 09/12/2018 10:44:22 [name] ([id number]) -------------------------------------------------------------------------------- Education Assessment Details Patient Name: [name] Date of Service: 09/12/2018 10:15 AM Medical Record Number: [id number] Patient Account Number: [id number] Date of Birth/Sex: [birth year] (68 y.o. Female) Treating RN: Montey Hora Primary Care Elya Tarquinio: Crist Infante Other Clinician: Referring Kennard Fildes: Reginold Agent Treating Cheynne Virden/Extender: Melburn Hake, HOYT Weeks in Treatment: 0 Primary Learner Assessed: Patient Learning Preferences/Education Level/Primary Language Learning Preference: Explanation, Demonstration, Printed Material Highest Education Level: College or Above Preferred Language: English Cognitive Barrier Assessment/Beliefs Language Barrier: No Translator Needed: No Memory Deficit: No Emotional Barrier: No Cultural/Religious Beliefs Affecting Medical Care: No Physical Barrier Assessment Impaired Vision: No Impaired Hearing: No Decreased Hand dexterity: No Knowledge/Comprehension Assessment Knowledge Level: Medium Comprehension Level: Medium Ability to understand written Medium instructions: Ability to understand verbal Medium instructions: Motivation Assessment Anxiety Level: Calm Cooperation: Cooperative Education Importance: Acknowledges Need Interest in Health Problems: Asks Questions Perception: Coherent Willingness to Engage in Self- Medium Management Activities: Readiness to Engage in Self- Medium Management Activities: Electronic Signature(s) Signed: 09/12/2018 5:09:04 PM By: Montey Hora Entered By: Montey Hora on 09/12/2018 10:48:51 [name]  ([id number]) -------------------------------------------------------------------------------- Fall Risk Assessment Details Patient Name: [name] Date of Service: 09/12/2018 10:15 AM Medical Record Number: [id number] Patient Account Number: [id number] Date of Birth/Sex: 12-15-1950 (68 y.o. Female) Treating RN: Montey Hora Primary Care Kimmberly Wisser: Crist Infante Other Clinician: Referring Keigan Girten: Reginold Agent Treating Avanti Jetter/Extender: Melburn Hake, HOYT Weeks in Treatment: 0 Fall Risk Assessment Items Have you had 2 or more falls in the last 12 monthso 0 No Have you had any fall that resulted in injury in the last 12 monthso 0 No FALL RISK ASSESSMENT: History of falling - immediate or within 3 months 0 No Secondary diagnosis 0 No Ambulatory aid None/bed rest/wheelchair/nurse 0 Yes Crutches/cane/walker 0  No Furniture 0 No IV Access/Saline Lock 0 No Gait/Training Normal/bed rest/immobile 0 No Weak 10 Yes Impaired 0 No Mental Status Oriented to own ability 0 Yes Electronic Signature(s) Signed: 09/12/2018 5:09:04 PM By: Montey Hora Entered By: Montey Hora on 09/12/2018 10:47:18 [name] ([id number]) -------------------------------------------------------------------------------- Foot Assessment Details Patient Name: [name], Joyce. Date of Service: 09/12/2018 10:15 AM Medical Record Number: [id number] Patient Account Number: [id number] Date of Birth/Sex: 04/15/1951 (68 y.o. Female) Treating RN: Montey Hora Primary Care Izeyah Deike: Crist Infante Other Clinician: Referring Tanzie Rothschild: Reginold Agent Treating Kelce Bouton/Extender: Melburn Hake, HOYT Weeks in Treatment: 0 Foot Assessment Items Site Locations + = Sensation present, - = Sensation absent, C = Callus, U = Ulcer R = Redness, W = Warmth, M = Maceration, PU = Pre-ulcerative lesion F = Fissure, S = Swelling, D = Dryness Assessment Right: Left: Other Deformity: No No Prior Foot Ulcer: No No Prior  Amputation: No No Charcot Joint: No No Ambulatory Status: Ambulatory Without Help Gait: Steady Electronic Signature(s) Signed: 09/12/2018 5:09:04 PM By: Montey Hora Entered By: Montey Hora on 09/12/2018 10:57:24 [name] ([id number]) -------------------------------------------------------------------------------- Nutrition Risk Assessment Details Patient Name: MILEAH, HEMMER. Date of Service: 09/12/2018 10:15 AM Medical Record Number: [id number] Patient Account Number: [id number] Date of Birth/Sex: October 02, 1950 (68 y.o. Female) Treating RN: Montey Hora Primary Care Dajah Fischman: Crist Infante Other Clinician: Referring Kindal Ponti: Reginold Agent Treating Yarissa Reining/Extender: Melburn Hake, HOYT Weeks in Treatment: 0 Height (in): 63 Weight (lbs): 174 Body Mass Index (BMI): 30.8 Nutrition Risk Assessment Items NUTRITION RISK SCREEN: I have an illness or condition that made me change the kind and/or amount of 0 No food I eat I eat fewer than two meals per day 0 No I eat few fruits and vegetables, or milk products 0 No I have three or more drinks of beer, liquor or wine almost every day 0 No I have tooth or mouth problems that make it hard for me to eat 0 No I don't always have enough money to buy the food I need 0 No I eat alone most of the time 0 No I take three or more different prescribed or over-the-counter drugs a day 1 Yes Without wanting to, I have lost or gained 10 pounds in the last six months 0 No I am not always physically able to shop, cook and/or feed myself 0 No Nutrition Protocols Good Risk Protocol 0 No interventions needed Moderate Risk Protocol Electronic Signature(s) Signed: 09/12/2018 5:09:04 PM By: Montey Hora Entered By: Montey Hora on 09/12/2018 10:47:37

## 2018-09-15 NOTE — Progress Notes (Signed)
DMIYAH, LISCANO ([id number]) Visit Report for 09/12/2018 Allergy List Details Patient Name: [name], [name]. Date of Service: 09/12/2018 10:15 AM Medical Record Number: [id number] Patient Account Number: [id number] Date of Birth/Sex: 01-21-1951 (68 y.o. Female) Treating RN: Montey Hora Primary Care Tikia Skilton: Crist Infante Other Clinician: Referring Bard Haupert: Reginold Agent Treating Alese Furniss/Extender: STONE III, HOYT Weeks in Treatment: 0 Allergies Active Allergies codeine Lyrica adhesive Allergy Notes Electronic Signature(s) Signed: 09/12/2018 5:09:04 PM By: Montey Hora Entered By: Montey Hora on 09/12/2018 10:43:53 [name] ([id number]) -------------------------------------------------------------------------------- Arrival Information Details Patient Name: [name] Date of Service: 09/12/2018 10:15 AM Medical Record Number: [id number] Patient Account Number: [id number] Date of Birth/Sex: [birth year] (68 y.o. Female) Treating RN: Cornell Barman Primary Care Hilery Wintle: Crist Infante Other Clinician: Referring Mitchel Delduca: Reginold Agent Treating Raiford Fetterman/Extender: Melburn Hake, HOYT Weeks in Treatment: 0 Visit Information Patient Arrived: Ambulatory Arrival Time: 10:29 Accompanied By: self Transfer Assistance: None Patient Identification Verified: Yes Secondary Verification Process Yes Completed: Patient Has Alerts: Yes Patient Alerts: Patient on Blood Thinner Eliquis Electronic Signature(s) Signed: 09/12/2018 5:09:04 PM By: Montey Hora Entered By: Montey Hora on 09/12/2018 11:08:43 [name] ([id number]) -------------------------------------------------------------------------------- Clinic Level of Care Assessment Details Patient Name: [name] Date of Service: 09/12/2018 10:15 AM Medical Record Number: [id number] Patient Account Number: [id number] Date of Birth/Sex: 10-22-1950 (68 y.o. Female) Treating RN: Cornell Barman Primary Care  Tehillah Cipriani: Crist Infante Other Clinician: Referring Cecila Satcher: Reginold Agent Treating Devell Parkerson/Extender: Melburn Hake, HOYT Weeks in Treatment: 0 Clinic Level of Care Assessment Items TOOL 1 Quantity Score []  - Use when EandM and Procedure is performed on INITIAL visit 0 ASSESSMENTS - Nursing Assessment / Reassessment X - General Physical Exam (combine w/ comprehensive assessment (listed just below) when 1 20 performed on new pt. evals) X- 1 25 Comprehensive Assessment (HX, ROS, Risk Assessments, Wounds Hx, etc.) ASSESSMENTS - Wound and Skin Assessment / Reassessment []  - Dermatologic / Skin Assessment (not related to wound area) 0 ASSESSMENTS - Ostomy and/or Continence Assessment and Care []  - Incontinence Assessment and Management 0 []  - 0 Ostomy Care Assessment and Management (repouching, etc.) PROCESS - Coordination of Care X - Simple Patient / Family Education for ongoing care 1 15 []  - 0 Complex (extensive) Patient / Family Education for ongoing care X- 1 10 Staff obtains Programmer, systems, Records, Test Results / Process Orders []  - 0 Staff telephones HHA, Nursing Homes / Clarify orders / etc []  - 0 Routine Transfer to another Facility (non-emergent condition) []  - 0 Routine Hospital Admission (non-emergent condition) X- 1 15 New Admissions / Biomedical engineer / Ordering NPWT, Apligraf, etc. []  - 0 Emergency Hospital Admission (emergent condition) PROCESS - Special Needs []  - Pediatric / Minor Patient Management 0 []  - 0 Isolation Patient Management []  - 0 Hearing / Language / Visual special needs []  - 0 Assessment of Community assistance (transportation, D/C planning, etc.) []  - 0 Additional assistance / Altered mentation []  - 0 Support Surface(s) Assessment (bed, cushion, seat, etc.) [name], CARTE. ([id number]) INTERVENTIONS - Miscellaneous []  - External ear exam 0 []  - 0 Patient Transfer (multiple staff / Civil Service fast streamer / Similar devices) []  - 0 Simple Staple /  Suture removal (25 or less) []  - 0 Complex Staple / Suture removal (26 or more) []  - 0 Hypo/Hyperglycemic Management (do not check if billed separately) X- 1 15 Ankle / Brachial Index (ABI) - do not check if billed separately Has the patient been seen at the hospital within the last three years: Yes  Total Score: 100 Level Of Care: New/Established - Level 3 Electronic Signature(s) Signed: 09/12/2018 5:43:44 PM By: Gretta Cool, BSN, RN, CWS, [name] RN, BSN Entered By: Gretta Cool, BSN, RN, CWS, [name] on 09/12/2018 11:28:50 [name] ([id number]) -------------------------------------------------------------------------------- Encounter Discharge Information Details Patient Name: [name], DESCHENE. Date of Service: 09/12/2018 10:15 AM Medical Record Number: [id number] Patient Account Number: [id number] Date of Birth/Sex: [birth year] (68 y.o. Female) Treating RN: Montey Hora Primary Care Magdelyn Roebuck: Crist Infante Other Clinician: Referring Elisabel Hanover: Reginold Agent Treating Cara Thaxton/Extender: Melburn Hake, HOYT Weeks in Treatment: 0 Encounter Discharge Information Items Post Procedure Vitals Discharge Condition: Stable Temperature (F): 98.4 Ambulatory Status: Ambulatory Pulse (bpm): 81 Discharge Destination: Home Respiratory Rate (breaths/min): 16 Transportation: Private Auto Blood Pressure (mmHg): 128/74 Accompanied By: self Schedule Follow-up Appointment: Yes Clinical Summary of Care: Electronic Signature(s) Signed: 09/12/2018 5:09:04 PM By: Montey Hora Entered By: Montey Hora on 09/12/2018 13:06:53 [name] ([id number]) -------------------------------------------------------------------------------- Lower Extremity Assessment Details Patient Name: [name], RYMER. Date of Service: 09/12/2018 10:15 AM Medical Record Number: [id number] Patient Account Number: [id number] Date of Birth/Sex: 1950/10/06 (68 y.o. Female) Treating RN: Montey Hora Primary Care Cora Brierley: Crist Infante  Other Clinician: Referring Markos Theil: Reginold Agent Treating Parry Po/Extender: Melburn Hake, HOYT Weeks in Treatment: 0 Edema Assessment Assessed: [Left: No] [Right: No] Edema: [Left: Yes] [Right: Yes] Calf Left: Right: Point of Measurement: 30 cm From Medial Instep 34 cm 36 cm Ankle Left: Right: Point of Measurement: 12 cm From Medial Instep 23 cm 22 cm Vascular Assessment Pulses: Dorsalis Pedis Palpable: [Left:Yes] [Right:Yes] Doppler Audible: [Left:Yes] [Right:Yes] Posterior Tibial Palpable: [Left:Yes] [Right:Yes] Doppler Audible: [Left:Yes] [Right:Yes] Extremity colors, hair growth, and conditions: Extremity Color: [Left:Normal] [Right:Normal] Hair Growth on Extremity: [Left:Yes] [Right:Yes] Temperature of Extremity: [Left:Warm] [Right:Warm] Capillary Refill: [Left:< 3 seconds] [Right:< 3 seconds] Blood Pressure: Brachial: [Left:110] Dorsalis Pedis: 150 [Left:Dorsalis Pedis: 150] Ankle: Posterior Tibial: 160 [Left:Posterior Tibial: 160 1.45] [Right:1.45] Toe Nail Assessment Left: Right: Thick: No No Discolored: No No Deformed: No No Improper Length and Hygiene: No No Electronic Signature(s) Signed: 09/12/2018 5:09:04 PM By: Montey Hora Entered By: Montey Hora on 09/12/2018 11:08:29 [name] ([id number]) Mariea Clonts, [place] ([id number]) -------------------------------------------------------------------------------- Multi Wound Chart Details Patient Name: [name], SCHATZMAN. Date of Service: 09/12/2018 10:15 AM Medical Record Number: [id number] Patient Account Number: [id number] Date of Birth/Sex: 1950/12/14 (68 y.o. 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] [N/A:N/A] Comorbid History: [1:Arrhythmia, Deep Vein Thrombosis, Hypertension, Osteoarthritis, Received Chemotherapy] [N/A:N/A] Date Acquired: [1:08/01/2018] [N/A:N/A] Weeks of Treatment: [1:0] [N/A:N/A] Wound Status: [1:Open] [N/A:N/A] Measurements L x W x D [1:2.9x1.9x0.5] [N/A:N/A] (cm) Area (cm) : [1:4.328] [N/A:N/A] Volume (cm) : [1:2.164] [N/A:N/A] Classification: [1:Full Thickness Without Exposed Support Structures] [N/A:N/A] Exudate Amount: [1:Medium] [N/A:N/A] Exudate Type: [1:Purulent] [N/A:N/A] Exudate Color: [1:yellow, brown, green] [N/A:N/A] Wound Margin: [1:Flat and Intact] [N/A:N/A] Granulation Amount: [1:None Present (0%)] [N/A:N/A] Necrotic Amount: [1:Large (67-100%)] [N/A:N/A] Exposed Structures: [1:Fat Layer (Subcutaneous Tissue) Exposed: Yes Fascia: No Tendon: No Muscle: No Joint: No Bone: No] [N/A:N/A] Epithelialization: [1:None] [N/A:N/A] Periwound Skin Texture: [1:Excoriation: No Induration: No Callus: No Crepitus: No Rash: No Scarring: No] [N/A:N/A] Periwound Skin Moisture: Maceration: No N/A N/A Dry/Scaly: No Periwound Skin Color: Erythema: Yes N/A  N/A Atrophie Blanche: No Cyanosis: No Ecchymosis: No Hemosiderin Staining: No Mottled: No Pallor: No Rubor: No Erythema Location: Circumferential N/A N/A Temperature: No Abnormality N/A N/A Tenderness on Palpation: Yes N/A N/A Wound Preparation: Ulcer Cleansing: N/A N/A Rinsed/Irrigated with Saline Topical Anesthetic Applied: Other: lidocaine 4% Treatment Notes Electronic Signature(s) Signed: 09/12/2018 5:43:44 PM By: Gretta Cool, BSN, RN, CWS, [name] RN, BSN Entered By: Gretta Cool, BSN, RN, CWS, [name] on 09/12/2018 11:22:59 [name]  ([id number]) -------------------------------------------------------------------------------- [place] Details Patient Name: [name], SANDIFORD. Date of Service: 09/12/2018 10:15 AM Medical Record Number: [id number] Patient Account Number: [id number] Date of Birth/Sex: 1950/12/09 (68 y.o. Female) Treating RN: Cornell Barman Primary Care Nakari Bracknell: Crist Infante Other Clinician: Referring Nakiya Rallis: Reginold Agent Treating Sarina Robleto/Extender: Melburn Hake, HOYT Weeks in Treatment: 0 Active Inactive Necrotic Tissue Nursing Diagnoses: Impaired tissue integrity related to necrotic/devitalized tissue Knowledge deficit related to management of necrotic/devitalized tissue Goals: Necrotic/devitalized tissue will be minimized in the wound bed Date Initiated: 09/12/2018 Target Resolution Date: 10/14/2018 Goal Status: Active Interventions: Assess patient pain level pre-, during and post procedure and prior to discharge Provide education on necrotic tissue and debridement process Treatment Activities: Apply topical anesthetic as ordered : 09/12/2018 Notes: Orientation to the Wound Care Program Nursing Diagnoses: Knowledge deficit related to the wound healing center program Goals: Patient/caregiver will verbalize understanding of the [facility] Date Initiated: 09/12/2018 Target Resolution Date: 10/14/2018 Goal Status: Active Interventions: Provide education on orientation to the wound center Notes: Pain, Acute or Chronic Nursing Diagnoses: Pain Management - Non-cyclic Acute (Procedural) Goals: Patient will verbalize adequate pain control and receive pain control interventions during procedures as needed MALYIAH, [name] ([id number]) Date Initiated: 09/12/2018 Target Resolution Date: 10/14/2018 Goal Status: Active Interventions: Assess comfort goal upon admission Encourage patient to take pain medications as prescribed Treatment Activities: Administer pain control  measures as ordered : 09/12/2018 Notes: Wound/Skin Impairment Nursing Diagnoses: Impaired tissue integrity Goals: Ulcer/skin breakdown will have a volume reduction of 30% by week 4 Date Initiated: 09/12/2018 Target Resolution Date: 10/14/2018 Goal Status: Active Interventions: Assess patient/caregiver ability to obtain necessary supplies Assess patient/caregiver ability to perform ulcer/skin care regimen upon admission and as needed Assess ulceration(s) every visit Treatment Activities: Referred to DME Eilan Mcinerny for dressing supplies : 09/12/2018 Skin care regimen initiated : 09/12/2018 Notes: Electronic Signature(s) Signed: 09/12/2018 5:43:44 PM By: Gretta Cool, BSN, RN, CWS, [name] RN, BSN Entered By: Gretta Cool, BSN, RN, CWS, [name] on 09/12/2018 11:22:23 [name] ([id number]) -------------------------------------------------------------------------------- Pain Assessment Details Patient Name: [name], MAINER. Date of Service: 09/12/2018 10:15 AM Medical Record Number: [id number] Patient Account Number: [id number] Date of Birth/Sex: [birth year] (68 y.o. Female) Treating RN: Cornell Barman Primary Care Lenorris Karger: Crist Infante Other Clinician: Referring Paidyn Mcferran: Reginold Agent Treating Mattalynn Crandle/Extender: Melburn Hake, HOYT Weeks in Treatment: 0 Active Problems Location of Pain Severity and Description of Pain Patient Has Paino Yes Site Locations Rate the pain. Current Pain Level: 5 Pain Management and Medication Current Pain Management: Electronic Signature(s) Signed: 09/12/2018 11:33:04 AM By: Lorine Bears RCP, RRT, CHT Signed: 09/12/2018 5:43:44 PM By: Gretta Cool, BSN, RN, CWS, [name] RN, BSN Entered By: Lorine Bears on 09/12/2018 10:31:35 [name] ([id number]) -------------------------------------------------------------------------------- Patient/Caregiver Education Details Patient Name: [name], NUSSBAUMER. Date of Service: 09/12/2018 10:15 AM Medical Record  Number: [id number] Patient Account Number: [id number] Date of Birth/Gender: 1951/06/19 (68 y.o. Female) Treating RN: Cornell Barman Primary Care Physician: Crist Infante Other Clinician: Referring Physician: Reginold Agent Treating Physician/Extender: Melburn Hake, HOYT Weeks in Treatment: 0  Education Assessment Education Provided To: Patient Education Topics Provided Welcome To [facility]: Handouts: [facility] Methods: Demonstration, Explain/Verbal Responses: State content correctly Wound Debridement: Handouts: Wound Debridement, Other: Take over the counter pain reliever if needed Methods: Demonstration, Explain/Verbal Responses: State content correctly Wound/Skin Impairment: Handouts: Caring for Your Ulcer, Other: wound care as prescribed Methods: Demonstration, Explain/Verbal Responses: State content correctly Electronic Signature(s) Signed: 09/12/2018 5:43:44 PM By: Gretta Cool, BSN, RN, CWS, [name] RN, BSN Entered By: Gretta Cool, BSN, RN, CWS, [name] on 09/12/2018 [name], Joyce J. ([id number]) -------------------------------------------------------------------------------- Wound Assessment Details Patient Name: [name], OVERSTREET. Date of Service: 09/12/2018 10:15 AM Medical Record Number: [id number] Patient Account Number: [id number] Date of Birth/Sex: January 23, 1951 (68 y.o. Female) Treating RN: Montey Hora Primary Care Saachi Zale: Crist Infante Other Clinician: Referring Dontarius Sheley: Reginold Agent Treating Jerrica Thorman/Extender: Melburn Hake, HOYT Weeks in Treatment: 0 Wound Status Wound Number: 1 Primary Auto-immune Etiology: Wound Location: Right Lower Leg - Lateral Wound Open Wounding Event: Bump Status: Date Acquired: 08/01/2018 Comorbid Arrhythmia, Deep Vein Thrombosis, Weeks Of Treatment: 0 History: Hypertension, Osteoarthritis, Received Clustered Wound: No Chemotherapy Photos Photo Uploaded By: Secundino Ginger on 09/12/2018 11:53:29 Wound  Measurements Length: (cm) 2.9 Width: (cm) 1.9 Depth: (cm) 0.5 Area: (cm) 4.328 Volume: (cm) 2.164 % Reduction in Area: % Reduction in Volume: Epithelialization: None Tunneling: No Undermining: No Wound Description Full Thickness Without Exposed Support Foul Odo Classification: Structures Slough/F Wound Margin: Flat and Intact Exudate Medium Amount: Exudate Type: Purulent Exudate Color: yellow, brown, green r After Cleansing: No ibrino Yes Wound Bed Granulation Amount: None Present (0%) Exposed Structure Necrotic Amount: Large (67-100%) Fascia Exposed: No Necrotic Quality: Adherent Slough Fat Layer (Subcutaneous Tissue) Exposed: Yes Tendon Exposed: No Muscle Exposed: No Joint Exposed: No Bone Exposed: No Mottley, Joyce J. ([id number]) Periwound Skin Texture Texture Color No Abnormalities Noted: No No Abnormalities Noted: No Callus: No Atrophie Blanche: No Crepitus: No Cyanosis: No Excoriation: No Ecchymosis: No Induration: No Erythema: Yes Rash: No Erythema Location: Circumferential Scarring: No Hemosiderin Staining: No Mottled: No Moisture Pallor: No No Abnormalities Noted: No Rubor: No Dry / Scaly: No Maceration: No Temperature / Pain Temperature: No Abnormality Tenderness on Palpation: Yes Wound Preparation Ulcer Cleansing: Rinsed/Irrigated with Saline Topical Anesthetic Applied: Other: lidocaine 4%, Treatment Notes Wound #1 (Right, Lateral Lower Leg) Notes iodoflex and ABD with 3 ayer wrap with unna to anchor Electronic Signature(s) Signed: 09/12/2018 5:09:04 PM By: Montey Hora Entered By: Montey Hora on 09/12/2018 11:01:42 [name] ([id number]) -------------------------------------------------------------------------------- [place] Details Patient Name: [name], HORGAN. Date of Service: 09/12/2018 10:15 AM Medical Record Number: [id number] Patient Account Number: [id number] Date of Birth/Sex: 08-Feb-1951 (68 y.o. Female) Treating  RN: Cornell Barman Primary Care Sharine Cadle: Crist Infante Other Clinician: Referring Leonell Lobdell: Reginold Agent Treating Sheilla Maris/Extender: Melburn Hake, HOYT Weeks in Treatment: 0 Vital Signs Time Taken: 10:31 Temperature (F): 98.4 Height (in): 63 Pulse (bpm): 81 Source: Stated Respiratory Rate (breaths/min): 16 Weight (lbs): 174 Blood Pressure (mmHg): 128/74 Source: Measured Reference Range: 80 - 120 mg / dl Body Mass Index (BMI): 30.8 Airway Electronic Signature(s) Signed: 09/12/2018 11:33:04 AM By: Lorine Bears RCP, RRT, CHT Entered By: Lorine Bears on 09/12/2018 10:34:36

## 2018-09-15 NOTE — Progress Notes (Signed)
NEMIAH, BUBAR ([id number]) Visit Report for 09/12/2018 Chief Complaint Document Details Patient Name: [name], [name]. Date of Service: 09/12/2018 10:15 AM Medical Record Number: [id number] Patient Account Number: [id number] Date of Birth/Sex: October 12, 1950 (68 y.o. Female) Treating RN: Cornell Barman Primary Care Provider: Crist Infante Other Clinician: Referring Provider: Reginold Agent Treating Provider/Extender: Melburn Hake, Ryman Rathgeber Weeks in Treatment: 0 Information Obtained from: Patient Chief Complaint Right LE Ulcer Electronic Signature(s) Signed: 09/14/2018 9:42:16 AM By: Worthy Keeler PA-C Entered By: Worthy Keeler on 09/12/2018 11:17:38 [name] ([id number]) -------------------------------------------------------------------------------- Debridement Details Patient Name: [name] Date of Service: 09/12/2018 10:15 AM Medical Record Number: [id number] Patient Account Number: [id number] Date of Birth/Sex: 15-Dec-1950 (68 y.o. Female) Treating RN: Cornell Barman Primary Care Provider: Crist Infante Other Clinician: Referring Provider: Reginold Agent Treating Provider/Extender: Melburn Hake, Jamiria Langill Weeks in Treatment: 0 Debridement Performed for Wound #1 Right,Lateral Lower Leg Assessment: Performed By: Physician STONE III, Ercel Pepitone E., PA-C Debridement Type: Debridement Level of Consciousness (Pre- Awake and Alert procedure): Pre-procedure Verification/Time Yes - 11:23 Out Taken: Start Time: 11:23 Pain Control: Lidocaine Total Area Debrided (L x W): 2.9 (cm) x 1.9 (cm) = 5.51 (cm) Tissue and other material Viable, Slough, Subcutaneous, Slough debrided: Level: Skin/Subcutaneous Tissue Debridement Description: Excisional Instrument: Curette Bleeding: Moderate Hemostasis Achieved: Pressure End Time: 11:25 Procedural Pain: 3 Post Procedural Pain: 3 Response to Treatment: Procedure was tolerated well Level of Consciousness Awake and Alert (Post-procedure): Post  Debridement Measurements of Total Wound Length: (cm) 2.9 Width: (cm) 1.9 Depth: (cm) 0.6 Volume: (cm) 2.597 Character of Wound/Ulcer Post Debridement: Requires Further Debridement Post Procedure Diagnosis Same as Pre-procedure Electronic Signature(s) Signed: 09/12/2018 5:43:44 PM By: Gretta Cool, BSN, RN, CWS, [name] RN, BSN Signed: 09/14/2018 9:42:16 AM By: Worthy Keeler PA-C Entered By: Gretta Cool, BSN, RN, CWS, [name] on 09/12/2018 11:26:37 [name] ([id number]) -------------------------------------------------------------------------------- HPI Details Patient Name: [name], [name]. Date of Service: 09/12/2018 10:15 AM Medical Record Number: [id number] Patient Account Number: [id number] Date of Birth/Sex: 18-Oct-1950 (68 y.o. 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 She states that she often has areas like this that will come up but normally not to the severity. She's typically able to take care of these herself. However she's been having a lot of trouble since this past summer in particular. 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 She was most recently treated with this by her primary care provider. Fortunately that seems to have cleared up any infection that she may have had. Nonetheless other pertinent medical history includes the chronic long-term use of Eliquis secondary to atrial fibrillation. Her ABI appear to be okay today. She did have a DVT 13 years ago and this lag but has not had anything recently. The Eliquis seems to be beneficial in this regard. Otherwise there's  no evidence of active infection at this time. No fevers, chills, nausea, or vomiting noted at this time. Electronic Signature(s) Signed: 09/14/2018 9:42:16 AM By: Worthy Keeler PA-C Entered By: Worthy Keeler on 09/14/2018 09:05:00 [name] ([id number]) -------------------------------------------------------------------------------- Physical Exam Details Patient Name: [name], [name]. Date of Service: 09/12/2018 10:15 AM Medical Record Number: [id number]  Patient Account Number: [id number] Date of Birth/Sex: March 22, 1951 (68 y.o. Female) Treating RN: Cornell Barman Primary Care Provider: Crist Infante Other Clinician: Referring Provider: Reginold Agent Treating Provider/Extender: Melburn Hake, Humna Moorehouse Weeks in Treatment: 0 Constitutional sitting or standing blood pressure is within target range for patient.. pulse regular and within target range for patient.Marland Kitchen respirations regular, non-labored and within target range for patient.Marland Kitchen temperature within target range for patient.. Well- nourished and well-hydrated in no acute distress. Eyes conjunctiva clear no eyelid edema noted. pupils equal round and reactive to light and accommodation. Ears, Nose, Mouth, and Throat no gross abnormality of ear auricles or external auditory canals. normal hearing noted during conversation. mucus membranes moist. Respiratory normal breathing without difficulty. clear to auscultation bilaterally. Cardiovascular regular rate and rhythm with normal S1, S2. 2+ dorsalis pedis/posterior tibialis pulses. no clubbing, cyanosis, significant edema, <3 sec cap refill. Gastrointestinal (GI) soft, non-tender, non-distended, +BS. no ventral hernia noted. Musculoskeletal normal gait and posture. no significant deformity or arthritic changes, no loss or range of motion, no clubbing. Psychiatric this patient is able to make decisions and demonstrates good insight into disease process. Alert and Oriented x 3. pleasant and  cooperative. Notes On evaluation today patient appears to be doing rather well all things considering in regard to her right lower extremity ulcer. With that being said she does have some discomfort at the site unfortunately. This area did require sharp debridement today which I was able to perform to some degree but was not able to completely clean the surface. She was having discomfort with debridement. Electronic Signature(s) Signed: 09/14/2018 9:42:16 AM By: Worthy Keeler PA-C Entered By: Worthy Keeler on 09/14/2018 09:06:02 [name] ([id number]) -------------------------------------------------------------------------------- Physician Orders Details Patient Name: [name], [name]. Date of Service: 09/12/2018 10:15 AM Medical Record Number: [id number] Patient Account Number: [id number] Date of Birth/Sex: [birth year] (68 y.o. 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 Anesthetic (add to Medication List) Wound #1 Right,Lateral Lower Leg o Topical Lidocaine 4% cream applied to wound bed prior to debridement (In Clinic Only). o Benzocaine Topical Anesthetic Spray applied to wound bed prior to debridement (In Clinic Only). Primary Wound Dressing Wound #1 Right,Lateral Lower Leg o Iodoflex Secondary Dressing Wound #1 Right,Lateral Lower Leg o ABD pad Dressing Change Frequency Wound #1  Right,Lateral Lower Leg o Change dressing every week o Other: - Nurse visit as needed Follow-up Appointments Wound #1 Right,Lateral Lower Leg o Return Appointment in 1 week. o Nurse Visit as needed Edema Control Wound #1 Right,Lateral Lower Leg o 3 Layer Compression System - Right Lower Extremity - unna to anchor o Elevate legs to the level of the heart and pump ankles as often as possible [name], HUOT ([id number]) Electronic Signature(s) Signed: 09/12/2018 5:43:44 PM By: Gretta Cool, BSN, RN, CWS, [name] RN, BSN Signed: 09/14/2018 9:42:16 AM By: Worthy Keeler PA-C Entered By: Gretta Cool, BSN, RN, CWS, [name] on 09/12/2018 11:28:21 [name], [name] ([id number]) -------------------------------------------------------------------------------- Problem List Details Patient Name: [name], [name]. Date of Service: 09/12/2018 10:15 AM Medical Record Number: [id number]  Patient Account Number: [id number] Date of Birth/Sex: 01/28/1951 (68 y.o. 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 09/12/2018 No Yes L97.812 Non-pressure chronic ulcer of other part of right lower leg 09/12/2018 No Yes with fat layer exposed I48.0 Paroxysmal atrial fibrillation 09/12/2018 No Yes I10 Essential (primary) hypertension 09/12/2018 No Yes Z79.01 Long term (current) use of anticoagulants 09/12/2018 No Yes Inactive Problems Resolved Problems Electronic Signature(s) Signed: 09/14/2018 9:42:16 AM By: Worthy Keeler PA-C Entered By: Worthy Keeler on 09/12/2018 11:17:27 [name] ([id number]) -------------------------------------------------------------------------------- Progress Note Details Patient Name: [name] Date of Service: 09/12/2018 10:15 AM Medical Record Number: [id number] Patient Account Number: [id number] Date of Birth/Sex: 19-Jul-1951 (68 y.o. 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 She states that she often has areas like this that will come up but normally not to the severity. She's typically able to take care of these herself. However she's been having a lot of trouble since this past summer in particular. 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 She was most recently treated with this by her primary care provider. Fortunately that seems to have cleared up any infection that she may have had. Nonetheless other pertinent medical history includes the chronic long-term use of Eliquis secondary to atrial fibrillation. Her ABI appear to be okay today. She did have a DVT 13 years ago and this lag but has not had anything recently. The Eliquis seems to be beneficial in this regard. Otherwise there's no evidence of active infection at this time. No fevers, chills, nausea, or vomiting noted at this time. Wound History Patient presents with 1 open wound that has been present for approximately 6 weeks. Patient has been treating wound in the following manner: unna boots. Laboratory tests have not been performed in the last month. Patient reportedly has not tested positive for an antibiotic resistant organism. Patient reportedly has not tested positive for osteomyelitis. Patient reportedly has had testing performed to  evaluate circulation in the legs. Patient History Information obtained from Patient. Allergies codeine, Lyrica, adhesive Family History Cancer - Father, Diabetes - Maternal Grandparents, Lung Disease - Father, Stroke - Father, No family history of Heart Disease, Hereditary Spherocytosis, Hypertension, Kidney Disease, Seizures, Thyroid Problems, Tuberculosis. Social History Never smoker, Marital Status - Married, Alcohol Use - Never, Drug Use - No History, Caffeine Use - Daily. Medical History Eyes Denies history of Cataracts Eyes Denies history of Glaucoma Eyes Denies history of Optic Neuritis Ear/Nose/Mouth/Throat Denies history of Chronic sinus problems/congestion DHANYA, BOGLE ([id number]) Ear/Nose/Mouth/Throat Denies history of Middle ear problems Hematologic/Lymphatic Denies history of Anemia Hematologic/Lymphatic Denies history of Hemophilia Hematologic/Lymphatic Denies history of Human Immunodeficiency Virus Hematologic/Lymphatic Denies history of Sickle Cell Disease Respiratory Denies history of Aspiration Respiratory Denies history of Asthma Respiratory Denies history of Chronic Obstructive Pulmonary Disease (COPD) Respiratory Denies history of Pneumothorax Respiratory Denies history of Sleep Apnea Respiratory Denies history of Tuberculosis  Cardiovascular Patient has history of Arrhythmia - a fib Cardiovascular Patient has history of Deep Vein Thrombosis - 13 years ago Cardiovascular Patient has history of Hypertension Cardiovascular Denies history of Angina Cardiovascular Denies history of Congestive Heart Failure Cardiovascular Denies history of Coronary Artery Disease Cardiovascular Denies history of Hypotension Cardiovascular Denies history of Myocardial Infarction Cardiovascular Denies history of Peripheral Arterial Disease Cardiovascular Denies history of Peripheral Venous Disease Cardiovascular Denies history of  Phlebitis Cardiovascular Denies history of Vasculitis Gastrointestinal Denies history of Cirrhosis Gastrointestinal Denies history of Colitis Gastrointestinal Denies history of Crohn s Gastrointestinal Denies history of Hepatitis A Gastrointestinal Denies history of Hepatitis B Gastrointestinal Denies history of Hepatitis C Endocrine Denies history of Type I Diabetes [name], [name] ([id number]) Endocrine Denies history of Type II Diabetes Genitourinary Denies history of End Stage Renal Disease Immunological Denies history of Lupus Erythematosus Immunological Denies history of Raynaud s Immunological Denies history of Scleroderma Integumentary (Skin) Denies history of History of Burn Integumentary (Skin) Denies history of History of pressure wounds Musculoskeletal Patient has history of Osteoarthritis Musculoskeletal Denies history of Gout Musculoskeletal Denies history of Rheumatoid Arthritis Musculoskeletal Denies history of Osteomyelitis Neurologic Denies history of Dementia Neurologic Denies history of Neuropathy Neurologic Denies history of Quadriplegia Neurologic Denies history of Paraplegia Neurologic Denies history of Seizure Disorder Oncologic Patient has history of Received Chemotherapy - past for Wegeners Oncologic Denies history of Received Radiation Psychiatric Denies history of Anorexia/bulimia Psychiatric Denies history of Confinement Anxiety Medical And Surgical History Notes Immunological Wegeners Musculoskeletal OP Review of Systems (ROS) Constitutional Symptoms (General Health) Denies complaints or symptoms of Fatigue, Fever, Chills, Marked Weight Change. Eyes Denies complaints or symptoms of Dry Eyes, Vision Changes, Glasses / Contacts. Ear/Nose/Mouth/Throat Denies complaints or symptoms of Difficult clearing ears, Sinusitis. Hematologic/Lymphatic Denies complaints or symptoms of Bleeding / Clotting Disorders, Human  Immunodeficiency Virus. Respiratory Denies complaints or symptoms of Chronic or frequent coughs, Shortness of Breath. Cardiovascular Complains or has symptoms of LE edema. [name], [name] ([id number]) Denies complaints or symptoms of Chest pain. Gastrointestinal Denies complaints or symptoms of Frequent diarrhea, Nausea, Vomiting. Endocrine Denies complaints or symptoms of Hepatitis, Thyroid disease, Polydypsia (Excessive Thirst). Genitourinary Denies complaints or symptoms of Kidney failure/ Dialysis, Incontinence/dribbling. Immunological Denies complaints or symptoms of Hives, Itching. Integumentary (Skin) Complains or has symptoms of Wounds. Denies complaints or symptoms of Bleeding or bruising tendency, Breakdown, Swelling. Musculoskeletal Denies complaints or symptoms of Muscle Pain, Muscle Weakness. Neurologic Denies complaints or symptoms of Numbness/parasthesias, Focal/Weakness. Psychiatric Denies complaints or symptoms of Anxiety, Claustrophobia. Objective Constitutional sitting or standing blood pressure is within target range for patient.. pulse regular and within target range for patient.Marland Kitchen respirations regular, non-labored and within target range for patient.Marland Kitchen temperature within target range for patient.. Well- nourished and well-hydrated in no acute distress. Vitals Time Taken: 10:31 AM, Height: 63 in, Source: Stated, Weight: 174 lbs, Source: Measured, BMI: 30.8, Temperature: 98.4 F, Pulse: 81 bpm, Respiratory Rate: 16 breaths/min, Blood Pressure: 128/74 mmHg. Eyes conjunctiva clear no eyelid edema noted. pupils equal round and reactive to light and accommodation. Ears, Nose, Mouth, and Throat no gross abnormality of ear auricles or external auditory canals. normal hearing noted during conversation. mucus membranes moist. Respiratory normal breathing without difficulty. clear to auscultation bilaterally. Cardiovascular regular rate and rhythm with normal S1, S2.  2+ dorsalis pedis/posterior tibialis pulses. no clubbing, cyanosis, significant edema, Gastrointestinal (GI) soft, non-tender, non-distended, +BS. no ventral hernia noted. Musculoskeletal normal gait and posture. no significant deformity or arthritic changes, no loss  or range of motion, no clubbing. Psychiatric this patient is able to make decisions and demonstrates good insight into disease process. Alert and Oriented x 3. pleasant [name], [name]. ([id number]) and cooperative. General Notes: On evaluation today patient appears to be doing rather well all things considering in regard to her right lower extremity ulcer. With that being said she does have some discomfort at the site unfortunately. This area did require sharp debridement today which I was able to perform to some degree but was not able to completely clean the surface. She was having discomfort with debridement. Integumentary (Hair, Skin) Wound #1 status is Open. Original cause of wound was Bump. The wound is located on the Right,Lateral Lower Leg. The wound measures 2.9cm length x 1.9cm width x 0.5cm depth; 4.328cm^2 area and 2.164cm^3 volume. There is Fat Layer (Subcutaneous Tissue) Exposed exposed. There is no tunneling or undermining noted. There is a medium amount of purulent drainage noted. The wound margin is flat and intact. There is no granulation within the wound bed. There is a large (67-100%) amount of necrotic tissue within the wound bed including Adherent Slough. The periwound skin appearance exhibited: Erythema. The periwound skin appearance did not exhibit: Callus, Crepitus, Excoriation, Induration, Rash, Scarring, Dry/Scaly, Maceration, Atrophie Blanche, Cyanosis, Ecchymosis, Hemosiderin Staining, Mottled, Pallor, Rubor. The surrounding wound skin color is noted with erythema which is circumferential. Periwound temperature was noted as No Abnormality. The periwound has tenderness on palpation. 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 There was a Excisional Skin/Subcutaneous Tissue Debridement with a total area of 5.51 sq cm performed by STONE III, Jaxxon Naeem E., PA-C. With the following instrument(s): Curette to remove Viable tissue/material. Material removed includes Subcutaneous Tissue and Slough and after achieving pain control using Lidocaine. No specimens were taken. A time out was conducted at 11:23, prior to the start of the procedure. A Moderate amount of bleeding was controlled with Pressure. The procedure was tolerated well with a pain level of 3 throughout and a pain level of 3 following the procedure. Post Debridement Measurements: 2.9cm length x 1.9cm width x 0.6cm depth; 2.597cm^3 volume. Character of Wound/Ulcer Post Debridement requires further debridement. Post procedure Diagnosis Wound #1: Same as Pre-Procedure Plan [name], [name] ([id number]) Wound Cleansing: Wound #1 Right,Lateral Lower Leg: Clean wound with Normal Saline. Anesthetic (add to Medication List): Wound #1 Right,Lateral Lower Leg: Topical Lidocaine 4% cream applied to wound bed prior to debridement (In Clinic Only). Benzocaine Topical Anesthetic Spray applied to wound bed prior to debridement (In Clinic Only). Primary Wound Dressing: Wound #1 Right,Lateral Lower Leg: Iodoflex Secondary Dressing: Wound #1 Right,Lateral Lower Leg: ABD pad Dressing Change Frequency: Wound #1 Right,Lateral Lower Leg: Change dressing every week Other: - Nurse visit as needed Follow-up Appointments: Wound #1 Right,Lateral Lower Leg: Return Appointment in 1 week. Nurse Visit as  needed Edema Control: Wound #1 Right,Lateral Lower Leg: 3 Layer Compression System - Right Lower Extremity - unna to anchor Elevate legs to the level of the heart and pump ankles as often as possible Currently my suggestion was that we go ahead and initiate the above wound care measures for the next week. Patient is in agreement with plan. My hope is that the Iodoflex will help  to continue to clean the wound without causing as much discomfort as sharp debridement would take to clean the surface completely. She is happier to go down this route and then we will subsequently see were things stand at follow-up. Please see above for specific wound care orders. We will see patient for re-evaluation in 1 week(s) here in the clinic. If anything worsens or changes patient will contact our office for additional recommendations. Electronic Signature(s) Signed: 09/14/2018 9:42:16 AM By: Worthy Keeler PA-C Entered By: Worthy Keeler on 09/14/2018 09:06:46 [name] ([id number]) -------------------------------------------------------------------------------- ROS/PFSH Details Patient Name: MISCHELLE, REEG. Date of Service: 09/12/2018 10:15 AM Medical Record Number: [id number] Patient Account Number: [id number] Date of Birth/Sex: 25-May-1951 (68 y.o. Female) Treating RN: Montey Hora Primary Care Provider: Crist Infante Other Clinician: Referring Provider: Reginold Agent Treating Provider/Extender: Melburn Hake, Arlana Canizales Weeks in Treatment: 0 Information Obtained From Patient Wound History Do you currently have one or more open woundso Yes How many open wounds do you currently haveo 1 Approximately how long have you had your woundso 6 weeks How have you been treating your wound(s) until nowo unna boots Has your wound(s) ever healed and then re-openedo No Have you had any lab work done in the past montho No Have you tested positive for an antibiotic resistant organism (MRSA, VRE)o No Have you  tested positive for osteomyelitis (bone infection)o No Have you had any tests for circulation on your legso Yes Who ordered the testo [facility] Where was the test doneo 2015 Constitutional Symptoms (General Health) Complaints and Symptoms: Negative for: Fatigue; Fever; Chills; Marked Weight Change Eyes Complaints and Symptoms: Negative for: Dry Eyes; Vision Changes; Glasses / Contacts Medical History: Negative for: Cataracts; Glaucoma; Optic Neuritis Ear/Nose/Mouth/Throat Complaints and Symptoms: Negative for: Difficult clearing ears; Sinusitis Medical History: Negative for: Chronic sinus problems/congestion; Middle ear problems Hematologic/Lymphatic Complaints and Symptoms: Negative for: Bleeding / Clotting Disorders; Human Immunodeficiency Virus Medical History: Negative for: Anemia; Hemophilia; Human Immunodeficiency Virus; Sickle Cell Disease Respiratory Complaints and Symptoms: Negative for: Chronic or frequent coughs; Shortness of Breath SHANAN, FITZPATRICK ([id number]) Medical History: Negative for: Aspiration; Asthma; Chronic Obstructive Pulmonary Disease (COPD); Pneumothorax; Sleep Apnea; Tuberculosis Cardiovascular Complaints and Symptoms: Positive for: LE edema Negative for: Chest pain Medical History: Positive for: Arrhythmia - a fib; Deep Vein Thrombosis - 13 years ago; Hypertension Negative for: Angina; Congestive Heart Failure; Coronary Artery Disease; Hypotension; Myocardial Infarction; Peripheral Arterial Disease; Peripheral Venous Disease; Phlebitis; Vasculitis Gastrointestinal Complaints and Symptoms: Negative for: Frequent diarrhea; Nausea; Vomiting Medical History: Negative for: Cirrhosis ; Colitis; Crohnos; Hepatitis A; Hepatitis B; Hepatitis C Endocrine Complaints and Symptoms: Negative for: Hepatitis; Thyroid disease; Polydypsia (Excessive Thirst) Medical History: Negative for: Type I Diabetes; Type II Diabetes Genitourinary Complaints and  Symptoms: Negative for: Kidney failure/ Dialysis; Incontinence/dribbling Medical History: Negative for: End Stage Renal Disease Immunological Complaints and Symptoms: Negative for: Hives; Itching Medical History: Negative for: Lupus Erythematosus; Raynaudos; Scleroderma Past Medical History Notes: Wegeners Integumentary (Skin) Complaints and Symptoms: Positive for: Wounds Negative for: Bleeding or bruising tendency; Breakdown; Swelling Medical History: Negative for: History of Burn; History of pressure wounds Musculoskeletal AYAUNA, MCNAY ([id number]) Complaints and Symptoms: Negative for: Muscle Pain; Muscle Weakness Medical History: Positive for: Osteoarthritis Negative for: Gout; Rheumatoid Arthritis; Osteomyelitis Past Medical History Notes: OP Neurologic Complaints and Symptoms: Negative for: Numbness/parasthesias; Focal/Weakness Medical History: Negative for: Dementia; Neuropathy; Quadriplegia; Paraplegia; Seizure Disorder Psychiatric Complaints and Symptoms: Negative for: Anxiety; Claustrophobia Medical History: Negative for: Anorexia/bulimia; Confinement Anxiety Oncologic Medical  History: Positive for: Received Chemotherapy - past for Wegeners Negative for: Received Radiation Immunizations Pneumococcal Vaccine: Received Pneumococcal Vaccination: Yes Implantable Devices Family and Social History Cancer: Yes - Father; Diabetes: Yes - Maternal Grandparents; Heart Disease: No; Hereditary Spherocytosis: No; Hypertension: No; Kidney Disease: No; Lung Disease: Yes - Father; Seizures: No; Stroke: Yes - Father; Thyroid Problems: No; Tuberculosis: No; Never smoker; Marital Status - Married; Alcohol Use: Never; Drug Use: No History; Caffeine Use: Daily; Financial Concerns: No; Food, Clothing or Shelter Needs: No; Support System Lacking: No; Transportation Concerns: No; Advanced Directives: Yes; Medical Power of Attorney: Yes - sister - Media Pizzini Electronic  Signature(s) Signed: 09/12/2018 5:09:04 PM By: Montey Hora Signed: 09/14/2018 9:42:16 AM By: Worthy Keeler PA-C Entered By: Montey Hora on 09/12/2018 10:56:59 [name] ([id number]) -------------------------------------------------------------------------------- SuperBill Details Patient Name: KEIARRA, CHARON. Date of Service: 09/12/2018 Medical Record Number: [id number] Patient Account Number: [id number] Date of Birth/Sex: 1951/01/27 (68 y.o. 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 I48.0 Paroxysmal atrial fibrillation I10 Essential (primary) hypertension Z79.01 Long term (current) use of anticoagulants Facility Procedures CPT4 Code Description: 09311216 99213 - WOUND CARE VISIT-LEV 3 EST PT Modifier: Quantity: 1 CPT4 Code Description: 24469507 11042 - DEB SUBQ TISSUE 20 SQ CM/< ICD-10 Diagnosis Description K25.750 Non-pressure chronic ulcer of other part of right lower leg wit Modifier: h fat layer expo Quantity: 1 sed Physician Procedures CPT4 Code Description: 5183358 WC PHYS LEVEL 3 o NEW PT ICD-10 Diagnosis Description 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

## 2018-09-17 DIAGNOSIS — M313 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: Secondary | ICD-10-CM | POA: Diagnosis not present

## 2018-09-17 DIAGNOSIS — I10 Essential (primary) hypertension: Secondary | ICD-10-CM | POA: Diagnosis not present

## 2018-09-17 DIAGNOSIS — Z7901 Long term (current) use of anticoagulants: Secondary | ICD-10-CM | POA: Diagnosis not present

## 2018-09-17 DIAGNOSIS — I480 Paroxysmal atrial fibrillation: Secondary | ICD-10-CM | POA: Diagnosis not present

## 2018-09-17 DIAGNOSIS — M1990 Unspecified osteoarthritis, unspecified site: Secondary | ICD-10-CM | POA: Diagnosis not present

## 2018-09-17 DIAGNOSIS — L97812 Non-pressure chronic ulcer of other part of right lower leg with fat layer exposed: Secondary | ICD-10-CM | POA: Diagnosis not present

## 2018-09-18 NOTE — Progress Notes (Signed)
[name], [name] ([id number]) Visit Report for 09/17/2018 Arrival Information Details Patient Name: [name], [name]. Date of Service: 09/17/2018 1:45 PM Medical Record Number: [id number] Patient Account Number: [id number] Date of Birth/Sex: 04/24/1951 (68 y.o. F) Treating RN: Montey Hora Primary Care Allister Lessley: Crist Infante Other Clinician: Referring Dionysios Massman: Crist Infante Treating Sol Odor/Extender: Melburn Hake, HOYT Weeks in Treatment: 0 Visit Information History Since Last Visit Added or deleted any medications: No Patient Arrived: Ambulatory Any new allergies or adverse reactions: No Arrival Time: 14:06 Had a fall or experienced change in No Accompanied By: self activities of daily living that may affect Transfer Assistance: None risk of falls: Patient Identification Verified: Yes Signs or symptoms of abuse/neglect since last visito No Secondary Verification Process Yes Hospitalized since last visit: No Completed: Implantable device outside of the clinic excluding No Patient Has Alerts: Yes cellular tissue based products placed in the center Patient Alerts: Patient on Blood since last visit: Thinner Has Dressing in Place as Prescribed: Yes Eliquis Has Compression in Place as Prescribed: Yes Pain Present Now: Yes Electronic Signature(s) Signed: 09/17/2018 4:47:13 PM By: Montey Hora Entered By: Montey Hora on 09/17/2018 14:06:47 [name] ([id number]) -------------------------------------------------------------------------------- Compression Therapy Details Patient Name: [name] Date of Service: 09/17/2018 1:45 PM Medical Record Number: [id number] Patient Account Number: [id number] Date of Birth/Sex: 1951/01/27 (68 y.o. F) Treating RN: Montey Hora Primary Care Christionna Poland: Crist Infante Other Clinician: Referring Adeja Sarratt: Crist Infante Treating Claborn Janusz/Extender: Melburn Hake, HOYT Weeks in Treatment: 0 Compression Therapy Performed for Wound Assessment:  Wound #1 Right,Lateral Lower Leg Performed By: Clinician Montey Hora, RN Compression Type: Three Layer Pre Treatment ABI: 1.4 Electronic Signature(s) Signed: 09/17/2018 4:47:13 PM By: Montey Hora Entered By: Montey Hora on 09/17/2018 14:07:25 [name] ([id number]) -------------------------------------------------------------------------------- Encounter Discharge Information Details Patient Name: [name], [name]. Date of Service: 09/17/2018 1:45 PM Medical Record Number: [id number] Patient Account Number: [id number] Date of Birth/Sex: 04/10/1951 (68 y.o. F) Treating RN: Montey Hora Primary Care Marquisha Nikolov: Crist Infante Other Clinician: Referring Tavian Callander: Crist Infante Treating Jayline Kilburg/Extender: Melburn Hake, HOYT Weeks in Treatment: 0 Encounter Discharge Information Items Discharge Condition: Stable Ambulatory Status: Ambulatory Discharge Destination: Home Transportation: Private Auto Accompanied By: self Schedule Follow-up Appointment: No Clinical Summary of Care: Electronic Signature(s) Signed: 09/17/2018 4:47:13 PM By: Montey Hora Entered By: Montey Hora on 09/17/2018 14:22:43 [name] ([id number]) -------------------------------------------------------------------------------- Patient/Caregiver Education Details Patient Name: [name], [name]. Date of Service: 09/17/2018 1:45 PM Medical Record Number: [id number] Patient Account Number: [id number] Date of Birth/Gender: 1950-10-11 (68 y.o. F) Treating RN: Montey Hora Primary Care Physician: Crist Infante Other Clinician: Referring Physician: Crist Infante Treating Physician/Extender: Sharalyn Ink in Treatment: 0 Education Assessment Education Provided To: Patient Education Topics Provided Wound/Skin Impairment: Handouts: Other: reportable s/s Methods: Explain/Verbal Responses: State content correctly Electronic Signature(s) Signed: 09/17/2018 4:47:13 PM By: Montey Hora Entered By:  Montey Hora on 09/17/2018 14:22:30 [name] ([id number]) -------------------------------------------------------------------------------- Wound Assessment Details Patient Name: [name], [name]. Date of Service: 09/17/2018 1:45 PM Medical Record Number: [id number] Patient Account Number: [id number] Date of Birth/Sex: Nov 03, 1950 (68 y.o. F) Treating RN: Montey Hora Primary Care Azie Mcconahy: Crist Infante Other Clinician: Referring Franciso Dierks: Crist Infante Treating Jubilee Vivero/Extender: STONE III, HOYT Weeks in Treatment: 0 Wound Status Wound Number: 1 Primary Auto-immune Etiology: Wound Location: Right Lower Leg - Lateral Wound Open Wounding Event: Bump Status: Date Acquired: 08/01/2018 Comorbid Arrhythmia, Deep Vein Thrombosis, Weeks Of Treatment: 0 History: Hypertension, Osteoarthritis, Received Clustered Wound: No Chemotherapy Wound Measurements Length: (cm) 2.9 Width: (  cm) 1.9 Depth: (cm) 0.5 Area: (cm) 4.328 Volume: (cm) 2.164 % Reduction in Area: 0% % Reduction in Volume: 0% Epithelialization: None Tunneling: No Undermining: No Wound Description Full Thickness Without Exposed Support Foul Od Classification: Structures Slough/ Wound Margin: Flat and Intact Exudate Medium Amount: Exudate Type: Purulent Exudate Color: yellow, brown, green or After Cleansing: No Fibrino Yes Wound Bed Granulation Amount: None Present (0%) Exposed Structure Necrotic Amount: Large (67-100%) Fascia Exposed: No Necrotic Quality: Adherent Slough Fat Layer (Subcutaneous Tissue) Exposed: Yes Tendon Exposed: No Muscle Exposed: No Joint Exposed: No Bone Exposed: No Periwound Skin Texture Texture Color No Abnormalities Noted: No No Abnormalities Noted: No Callus: No Atrophie Blanche: No Crepitus: No Cyanosis: No Excoriation: No Ecchymosis: No Induration: No Erythema: Yes Rash: No Erythema Location: Circumferential Scarring: No Hemosiderin Staining: No Mottled:  No Moisture Pallor: No No Abnormalities Noted: No Rubor: No [name], [name] ([id number]) Dry / Scaly: No Temperature / Pain Maceration: No Temperature: No Abnormality Tenderness on Palpation: Yes Wound Preparation Ulcer Cleansing: Rinsed/Irrigated with Saline, Other: soap and water, Topical Anesthetic Applied: Lidocaine 1% Ointment Treatment Notes Wound #1 (Right, Lateral Lower Leg) Notes iodoflex and ABD with 3 ayer wrap with unna to anchor Electronic Signature(s) Signed: 09/17/2018 4:47:13 PM By: Montey Hora Entered By: Montey Hora on 09/17/2018 14:07:08

## 2018-09-19 ENCOUNTER — Ambulatory Visit: Payer: Medicare Other | Admitting: Physician Assistant

## 2018-09-19 ENCOUNTER — Encounter: Payer: Medicare Other | Admitting: Physician Assistant

## 2018-09-19 DIAGNOSIS — M359 Systemic involvement of connective tissue, unspecified: Secondary | ICD-10-CM | POA: Diagnosis not present

## 2018-09-19 DIAGNOSIS — I10 Essential (primary) hypertension: Secondary | ICD-10-CM | POA: Diagnosis not present

## 2018-09-19 DIAGNOSIS — L97812 Non-pressure chronic ulcer of other part of right lower leg with fat layer exposed: Secondary | ICD-10-CM | POA: Diagnosis not present

## 2018-09-19 DIAGNOSIS — Z7901 Long term (current) use of anticoagulants: Secondary | ICD-10-CM | POA: Diagnosis not present

## 2018-09-19 DIAGNOSIS — M313 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: Secondary | ICD-10-CM | POA: Diagnosis not present

## 2018-09-19 DIAGNOSIS — I480 Paroxysmal atrial fibrillation: Secondary | ICD-10-CM | POA: Diagnosis not present

## 2018-09-19 DIAGNOSIS — M1990 Unspecified osteoarthritis, unspecified site: Secondary | ICD-10-CM | POA: Diagnosis not present

## 2018-09-21 NOTE — Progress Notes (Signed)
KELSEI, DEFINO ([id number]) Visit Report for 09/19/2018 Arrival Information Details Patient Name: [name], [name]. Date of Service: 09/19/2018 1:15 PM Medical Record Number: [id number] Patient Account Number: [id number] Date of Birth/Sex: 09-01-1950 (68 y.o. F) Treating RN: Montey Hora Primary Care Eloisa Chokshi: Crist Infante Other Clinician: Referring Raquel Racey: Crist Infante Treating Demesha Boorman/Extender: Melburn Hake, HOYT Weeks in Treatment: 1 Visit Information History Since Last Visit Added or deleted any medications: No Patient Arrived: Ambulatory Any new allergies or adverse reactions: No Arrival Time: 13:36 Had a fall or experienced change in No Accompanied By: self activities of daily living that may affect Transfer Assistance: None risk of falls: Patient Identification Verified: Yes Signs or symptoms of abuse/neglect since last visito No Secondary Verification Process Yes Hospitalized since last visit: No Completed: Implantable device outside of the clinic excluding No Patient Has Alerts: Yes cellular tissue based products placed in the center Patient Alerts: Patient on Blood since last visit: Thinner Has Dressing in Place as Prescribed: Yes Eliquis Has Compression in Place as Prescribed: Yes Pain Present Now: No Electronic Signature(s) Signed: 09/19/2018 5:03:44 PM By: Montey Hora Entered By: Montey Hora on 09/19/2018 13:36:28 [name] ([id number]) -------------------------------------------------------------------------------- Encounter Discharge Information Details Patient Name: [name], [name]. Date of Service: 09/19/2018 1:15 PM Medical Record Number: [id number] Patient Account Number: [id number] Date of Birth/Sex: [birth year] (68 y.o. F) Treating RN: Montey Hora Primary Care Henretter Piekarski: Crist Infante Other Clinician: Referring Yonas Bunda: Crist Infante Treating Emmalia Heyboer/Extender: Melburn Hake, HOYT Weeks in Treatment: 1 Encounter Discharge Information Items Post  Procedure Vitals Discharge Condition: Stable Temperature (F): 98.3 Ambulatory Status: Ambulatory Pulse (bpm): 83 Discharge Destination: Home Respiratory Rate (breaths/min): 16 Transportation: Private Auto Blood Pressure (mmHg): 126/74 Accompanied By: self Schedule Follow-up Appointment: Yes Clinical Summary of Care: Electronic Signature(s) Signed: 09/19/2018 5:03:44 PM By: Montey Hora Entered By: Montey Hora on 09/19/2018 14:04:39 [name] ([id number]) -------------------------------------------------------------------------------- Lower Extremity Assessment Details Patient Name: [name], [name]. Date of Service: 09/19/2018 1:15 PM Medical Record Number: [id number] Patient Account Number: [id number] Date of Birth/Sex: [birth year] (68 y.o. F) Treating RN: Montey Hora Primary Care Justen Fonda: Crist Infante Other Clinician: Referring Jaden Batchelder: Crist Infante Treating Shahid Flori/Extender: Melburn Hake, HOYT Weeks in Treatment: 1 Edema Assessment Assessed: [Left: No] [Right: No] [Left: Edema] [Right: :] Calf Left: Right: Point of Measurement: 30 cm From Medial Instep cm 35.8 cm Ankle Left: Right: Point of Measurement: 12 cm From Medial Instep cm 23.3 cm Vascular Assessment Pulses: Dorsalis Pedis Palpable: [Right:Yes] Posterior Tibial Extremity colors, hair growth, and conditions: Extremity Color: [Right:Hyperpigmented] Hair Growth on Extremity: [Right:Yes] Temperature of Extremity: [Right:Warm] Capillary Refill: [Right:< 3 seconds] Toe Nail Assessment Left: Right: Thick: Yes Discolored: Yes Deformed: No Improper Length and Hygiene: Yes Electronic Signature(s) Signed: 09/19/2018 5:03:44 PM By: Montey Hora Entered By: Montey Hora on 09/19/2018 13:42:48 Schubring, Joyce Lenna Sciara ([id number]) -------------------------------------------------------------------------------- Multi Wound Chart Details Patient Name: [name]. Date of Service: 09/19/2018 1:15 PM Medical  Record Number: [id number] Patient Account Number: [id number] Date of Birth/Sex: April 26, 1951 (68 y.o. 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] [N/A:N/A] Comorbid History: [1:Arrhythmia, Deep Vein Thrombosis, Hypertension, Osteoarthritis, Received Chemotherapy] [N/A:N/A] Date Acquired: [1:08/01/2018] [N/A:N/A] Weeks of Treatment: [1:1] [N/A:N/A]  Wound Status: [1:Open] [N/A:N/A] Measurements L x W x D [1:2.6x2.3x0.5] [N/A:N/A] (cm) Area (cm) : [1:4.697] [N/A:N/A] Volume (cm) : [1:2.348] [N/A:N/A] % Reduction in Area: [1:-8.50%] [N/A:N/A] % Reduction in Volume: [1:-8.50%] [N/A:N/A] Classification: [1:Full Thickness Without Exposed Support Structures] [N/A:N/A] Exudate Amount: [1:Medium] [N/A:N/A] Exudate Type: [1:Purulent] [N/A:N/A] Exudate Color: [1:yellow, brown, green] [N/A:N/A] Wound Margin: [1:Flat and Intact] [N/A:N/A] Granulation Amount: [1:None Present (0%)] [N/A:N/A] Necrotic Amount: [1:Large (67-100%)] [N/A:N/A] Exposed Structures: [1:Fat Layer (Subcutaneous Tissue) Exposed: Yes Fascia: No Tendon: No Muscle: No Joint: No Bone: No] [N/A:N/A] Epithelialization: [1:None] [N/A:N/A] Periwound Skin Texture: [1:Excoriation: No Induration: No Callus: No Crepitus: No] [N/A:N/A] Rash: No Scarring: No Periwound Skin Moisture: Maceration: No N/A N/A Dry/Scaly: No Periwound Skin Color: Erythema: Yes N/A N/A Atrophie Blanche: No Cyanosis: No Ecchymosis: No Hemosiderin Staining: No Mottled: No Pallor:  No Rubor: No Erythema Location: Circumferential N/A N/A Temperature: No Abnormality N/A N/A Tenderness on Palpation: Yes N/A N/A Wound Preparation: Ulcer Cleansing: N/A N/A Rinsed/Irrigated with Saline, Other: soap and water Topical Anesthetic Applied: Other: lidocaine 4% Treatment Notes Electronic Signature(s) Signed: 09/19/2018 5:03:44 PM By: Montey Hora Entered By: Montey Hora on 09/19/2018 13:46:30 [name] ([id number]) -------------------------------------------------------------------------------- [place] Details Patient Name: [name], [name]. Date of Service: 09/19/2018 1:15 PM Medical Record Number: [id number] Patient Account Number: [id number] Date of Birth/Sex: 1950-12-07 (67 y.o. F) Treating RN: Montey Hora Primary Care Mardy Lucier: Crist Infante Other Clinician: Referring Karim Aiello: Crist Infante Treating Abbygail [place]/Extender: Melburn Hake, HOYT Weeks in Treatment: 1 Active Inactive Necrotic Tissue Nursing Diagnoses: Impaired tissue integrity related to necrotic/devitalized tissue Knowledge deficit related to management of necrotic/devitalized tissue Goals: Necrotic/devitalized tissue will be minimized in the wound bed Date Initiated: 09/12/2018 Target Resolution Date: 10/14/2018 Goal Status: Active Interventions: Assess patient pain level pre-, during and post procedure and prior to discharge Provide education on necrotic tissue and debridement process Treatment Activities: Apply topical anesthetic as ordered : 09/12/2018 Notes: Orientation to the Wound Care Program Nursing Diagnoses: Knowledge deficit related to the wound healing center program Goals: Patient/caregiver will verbalize understanding of the [place] Date Initiated: 09/12/2018 Target Resolution Date: 10/14/2018 Goal Status: Active Interventions: Provide education on orientation to the wound center Notes: Pain, Acute or Chronic Nursing Diagnoses: Pain  Management - Non-cyclic Acute (Procedural) Goals: Patient will verbalize adequate pain control and receive pain control interventions during procedures as needed EMBERLIN, VERNER ([id number]) Date Initiated: 09/12/2018 Target Resolution Date: 10/14/2018 Goal Status: Active Interventions: Assess comfort goal upon admission Encourage patient to take pain medications as prescribed Treatment Activities: Administer pain control measures as ordered : 09/12/2018 Notes: Wound/Skin Impairment Nursing Diagnoses: Impaired tissue integrity Goals: Ulcer/skin breakdown will have a volume reduction of 30% by week 4 Date Initiated: 09/12/2018 Target Resolution Date: 10/14/2018 Goal Status: Active Interventions: Assess patient/caregiver ability to obtain necessary supplies Assess patient/caregiver ability to perform ulcer/skin care regimen upon admission and as needed Assess ulceration(s) every visit Treatment Activities: Referred to DME Annaliese Saez for dressing supplies : 09/12/2018 Skin care regimen initiated : 09/12/2018 Notes: Electronic Signature(s) Signed: 09/19/2018 5:03:44 PM By: Montey Hora Entered By: Montey Hora on 09/19/2018 13:46:21 [name] ([id number]) -------------------------------------------------------------------------------- Pain Assessment Details Patient Name: [name], [name]. Date of Service: 09/19/2018 1:15 PM Medical Record Number: [id number] Patient Account Number: [id number] Date of Birth/Sex: September 01, 1950 (67 y.o. F) Treating RN: Montey Hora Primary Care Meryl Ponder: Crist Infante Other Clinician: Referring Shonia Skilling: Crist Infante Treating Brnadon Eoff/Extender: STONE III, HOYT Weeks in Treatment: 1 Active Problems Location of Pain Severity and  Description of Pain Patient Has Paino Yes Site Locations Pain Location: Pain in Ulcers With Dressing Change: Yes Duration of the Pain. Constant / Intermittento Constant Pain Management and Medication Current Pain  Management: Electronic Signature(s) Signed: 09/19/2018 5:03:44 PM By: Montey Hora Entered By: Montey Hora on 09/19/2018 13:37:23 [name] ([id number]) -------------------------------------------------------------------------------- Patient/Caregiver Education Details Patient Name: [name] Date of Service: 09/19/2018 1:15 PM Medical Record Number: [id number] Patient Account Number: [id number] Date of Birth/Gender: March 20, 1951 (68 y.o. F) Treating RN: Montey Hora Primary Care Physician: Crist Infante Other Clinician: Referring Physician: Crist Infante Treating Physician/Extender: Sharalyn Ink in Treatment: 1 Education Assessment Education Provided To: Patient Education Topics Provided Venous: Handouts: Other: wound care as ordered Methods: Explain/Verbal Responses: State content correctly Electronic Signature(s) Signed: 09/19/2018 5:03:44 PM By: Montey Hora Entered By: Montey Hora on 09/19/2018 14:04:55 [name] ([id number]) -------------------------------------------------------------------------------- Wound Assessment Details Patient Name: [name], [name]. Date of Service: 09/19/2018 1:15 PM Medical Record Number: [id number] Patient Account Number: [id number] Date of Birth/Sex: February 26, 1951 (67 y.o. 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 - Lateral Wound Open Wounding Event: Bump Status: Date Acquired: 08/01/2018 Comorbid Arrhythmia, Deep Vein Thrombosis, Weeks Of Treatment: 1 History: Hypertension, Osteoarthritis, Received Clustered Wound: No Chemotherapy Photos Photo Uploaded By: Montey Hora on 09/19/2018 17:01:58 Wound Measurements Length: (cm) 2.6 Width: (cm) 2.3 Depth: (cm) 0.5 Area: (cm)  4.697 Volume: (cm) 2.348 % Reduction in Area: -8.5% % Reduction in Volume: -8.5% Epithelialization: None Tunneling: No Undermining: No Wound Description Full Thickness Without Exposed Support Classification: Structures Wound Margin: Flat and Intact Exudate Medium Amount: Exudate Type: Purulent Exudate Color: yellow, brown, green Foul Odor After Cleansing: No Slough/Fibrino Yes Wound Bed Granulation Amount: None Present (0%) Exposed Structure Necrotic Amount: Large (67-100%) Fascia Exposed: No Necrotic Quality: Adherent Slough Fat Layer (Subcutaneous Tissue) Exposed: Yes Tendon Exposed: No Muscle Exposed: No Joint Exposed: No Bone Exposed: No [name], Joyce J. ([id number]) Periwound Skin Texture Texture Color No Abnormalities Noted: No No Abnormalities Noted: No Callus: No Atrophie Blanche: No Crepitus: No Cyanosis: No Excoriation: No Ecchymosis: No Induration: No Erythema: Yes Rash: No Erythema Location: Circumferential Scarring: No Hemosiderin Staining: No Mottled: No Moisture Pallor: No No Abnormalities Noted: No Rubor: No Dry / Scaly: No Maceration: No Temperature / Pain Temperature: No Abnormality Tenderness on Palpation: Yes Wound Preparation Ulcer Cleansing: Rinsed/Irrigated with Saline, Other: soap and water, Topical Anesthetic Applied: Other: lidocaine 4%, Treatment Notes Wound #1 (Right, Lateral Lower Leg) Notes iodoflex, xtrasorb and ABD with 3 ayer wrap with unna to anchor Electronic Signature(s) Signed: 09/19/2018 5:03:44 PM By: Montey Hora Entered By: Montey Hora on 09/19/2018 [place], [place] ([id number]) -------------------------------------------------------------------------------- [place] Details Patient Name: [name], [name]. Date of Service: 09/19/2018 1:15 PM Medical Record Number: [id number] Patient Account Number: [id number] Date of Birth/Sex: 1951/08/01 (67 y.o. F) Treating RN: Montey Hora Primary Care  Jakai Risse: Crist Infante Other Clinician: Referring Analena Gama: Crist Infante Treating Frannie Shedrick/Extender: Melburn Hake, HOYT Weeks in Treatment: 1 Vital Signs Time Taken: 13:37 Temperature (F): 98.3 Height (in): 63 Pulse (bpm): 83 Weight (lbs): 174 Respiratory Rate (breaths/min): 16 Body Mass Index (BMI): 30.8 Blood Pressure (mmHg): 126/74 Reference Range: 80 - 120 mg / dl Airway Electronic Signature(s) Signed: 09/19/2018 5:03:44 PM By: Montey Hora Entered By: Montey Hora on 09/19/2018 13:38:53

## 2018-09-21 NOTE — Progress Notes (Signed)
Joyce, Kaufman ([id number]) Visit Report for 09/19/2018 Chief Complaint Document Details Patient Name: [name], [name]. Date of Service: 09/19/2018 1:15 PM Medical Record Number: [id number] Patient Account Number: [id number] Date of Birth/Sex: August 16, 1951 (67 y.o. F) Treating RN: Montey Hora Primary Care Provider: Crist Infante Other Clinician: Referring Provider: Crist Infante Treating Provider/Extender: Melburn Hake, Charnelle Bergeman Weeks in Treatment: 1 Information Obtained from: Patient Chief Complaint Right LE Ulcer Electronic Signature(s) Signed: 09/19/2018 9:52:35 PM By: Worthy Keeler PA-C Entered By: Worthy Keeler on 09/19/2018 13:51:55 Borawski, [name] ([id number]) -------------------------------------------------------------------------------- Debridement Details Patient Name: [name] Date of Service: 09/19/2018 1:15 PM Medical Record Number: [id number] Patient Account Number: [id number] Date of Birth/Sex: [birth year] (67 y.o. F) Treating RN: Montey Hora Primary Care Provider: Crist Infante Other Clinician: Referring Provider: Crist Infante Treating Provider/Extender: Melburn Hake, Kishawn Pickar Weeks in Treatment: 1 Debridement Performed for Wound #1 Right,Lateral Lower Leg Assessment: Performed By: Physician STONE III, Joal Eakle E., PA-C Debridement Type: Debridement Level of Consciousness (Pre- Awake and Alert procedure): Pre-procedure Verification/Time Yes - 13:58 Out Taken: Start Time: 13:58 Pain Control: Lidocaine 4% Topical Solution Total Area Debrided (L x W): 2.6 (cm) x 2.3 (cm) = 5.98 (cm) Tissue and other material Viable, Non-Viable, Slough, Subcutaneous, Biofilm, Slough debrided: Level: Skin/Subcutaneous Tissue Debridement Description: Excisional Instrument: Curette Bleeding: Moderate Hemostasis Achieved: Pressure End Time: 14:01 Procedural Pain: 0 Post Procedural Pain: 0 Response to Treatment: Procedure was tolerated well Level of Consciousness Awake and  Alert (Post-procedure): Post Debridement Measurements of Total Wound Length: (cm) 2.6 Width: (cm) 2.3 Depth: (cm) 0.6 Volume: (cm) 2.818 Character of Wound/Ulcer Post Debridement: Improved Post Procedure Diagnosis Same as Pre-procedure Electronic Signature(s) Signed: 09/19/2018 5:03:44 PM By: Montey Hora Signed: 09/19/2018 9:52:35 PM By: Worthy Keeler PA-C Entered By: Montey Hora on 09/19/2018 14:02:57 [name] ([id number]) -------------------------------------------------------------------------------- HPI Details Patient Name: RIANNE, DEGRAAF. Date of Service: 09/19/2018 1:15 PM Medical Record Number: [id number] Patient Account Number: [id number] Date of Birth/Sex: [birth year] (67 y.o. 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 She states that she often has areas like this that will come up but normally not to the severity. She's typically able to take care of these herself. However she's been having a lot of trouble since this past summer in particular. 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 She was most recently treated with this by her primary care provider. Fortunately that seems to have cleared up any infection that she may have had. Nonetheless other pertinent medical history includes the chronic long-term use of Eliquis secondary to atrial fibrillation. Her ABI appear to be okay today. She did have a DVT 13 years ago and this lag but has not had anything recently. The Eliquis seems to be beneficial in this regard. Otherwise there's no evidence of active  infection at this time. No fevers, chills, nausea, or vomiting noted at this time. 09/19/18 on evaluation today patient presents for follow-up concerning her ongoing lower extremity wound. This is her second visit here in our clinic. She does have slough covering the surface of the wound today. Fortunately there is no signs of infection at this time. Overall I have  been pleased with the interval change although she still has a lot of Slough I do feel like the Iodoflex was of benefit for her. We did have to change her wrap once in the weeks since I last saw her fortunately after that changed nothing seem to smell or drain through. Electronic Signature(s) Signed: 09/19/2018 9:52:35 PM By: Worthy Keeler PA-C Entered By: Worthy Keeler on 09/19/2018 15:26:07 [name] ([id number]) -------------------------------------------------------------------------------- Physical Exam Details Patient Name: Joyce, Kaufman. Date of Service: 09/19/2018 1:15 PM Medical Record Number: [id number] Patient Account Number: [id number] Date of Birth/Sex: Oct 07, 1950 (67 y.o. F) Treating RN: Montey Hora Primary Care Provider: Crist Infante Other Clinician: Referring Provider: Crist Infante Treating Provider/Extender: STONE III, Jeremy Mclamb Weeks in Treatment: 1 Constitutional Well-nourished and well-hydrated in no acute distress. Respiratory normal breathing without difficulty. Psychiatric this patient is able to make decisions and demonstrates good insight into disease process. Alert and Oriented x 3. pleasant and cooperative. Notes Patient's wound bed currently shows evidence of good granulation at this time. She does have a lot of slough covering the surface however. When I was able to clear away some of the slough the granulation was exposed underline. Obviously this is not as healthy as I would like to see it but we are definitely working toward that state. She is still having some discomfort although she was  able to allow me to debride the wound without complication. Electronic Signature(s) Signed: 09/19/2018 9:52:35 PM By: Worthy Keeler PA-C Entered By: Worthy Keeler on 09/19/2018 15:26:44 [name] ([id number]) -------------------------------------------------------------------------------- Physician Orders Details Patient Name: LILLEY, HUBBLE. Date of Service: 09/19/2018 1:15 PM Medical Record Number: [id number] Patient Account Number: [id number] Date of Birth/Sex: September 30, 1950 (67 y.o. 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 Anesthetic (add to Medication List) Wound #1 Right,Lateral Lower Leg o Topical Lidocaine 4% cream applied to wound bed prior to debridement (In Clinic Only). o Benzocaine Topical Anesthetic Spray applied to wound bed prior to debridement (In Clinic Only). Primary Wound Dressing Wound #1 Right,Lateral Lower Leg o Iodoflex Secondary Dressing Wound #1 Right,Lateral Lower Leg o ABD pad o XtraSorb Dressing Change Frequency Wound #1 Right,Lateral Lower Leg o Change dressing every week o Other: - Nurse visit as needed Follow-up Appointments Wound #1 Right,Lateral Lower Leg o Return Appointment in 1 week. o Nurse Visit as needed Edema Control Wound #1 Right,Lateral Lower Leg o 3 Layer Compression System - Right Lower Extremity - unna to anchor o Elevate legs to the  level of the heart and pump ankles as often as possible ARIABELLA, BRIEN ([id number]) Electronic Signature(s) Signed: 09/19/2018 5:03:44 PM By: Montey Hora Signed: 09/19/2018 9:52:35 PM By: Worthy Keeler PA-C Entered By: Montey Hora on 09/19/2018 14:03:20 [name] ([id number]) -------------------------------------------------------------------------------- Problem List Details Patient Name: KATLYNNE, MCKERCHER. Date of Service: 09/19/2018 1:15 PM Medical Record Number: [id number] Patient Account Number: [id number] Date of Birth/Sex: [birth year] (67 y.o. 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 09/12/2018 No Yes L97.812 Non-pressure chronic ulcer of other part of right lower leg 09/12/2018 No Yes with fat layer exposed I48.0 Paroxysmal atrial fibrillation 09/12/2018 No Yes I10 Essential (primary) hypertension 09/12/2018 No Yes Z79.01 Long term (current) use of anticoagulants 09/12/2018 No Yes Inactive Problems Resolved Problems Electronic Signature(s) Signed: 09/19/2018 9:52:35 PM By: Worthy Keeler PA-C Entered By: Worthy Keeler on 09/19/2018 13:51:48 Cockerell, [name] ([id number]) -------------------------------------------------------------------------------- Progress Note Details Patient Name: [name] Date of Service: 09/19/2018 1:15 PM Medical Record Number: [id number] Patient Account Number: [id number] Date of Birth/Sex: [birth year] (67 y.o. 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 She states that she often has areas like this that will come up but normally not to the severity. She's typically able to take care of these herself. However she's been having a lot of trouble since this past summer in particular. 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 She was most recently treated with this by her primary care provider. Fortunately that seems to have cleared up any infection that she may have had. Nonetheless other pertinent medical history includes the chronic long-term use of Eliquis secondary to atrial fibrillation. Her ABI appear to be okay today. She did have a DVT 13 years ago and this lag but has not had anything recently. The Eliquis seems to be beneficial in this regard. Otherwise there's no evidence of active infection at this time. No fevers, chills, nausea, or vomiting noted at this time. 09/19/18 on evaluation today patient presents for follow-up concerning her ongoing lower extremity wound. This is her second visit here in our clinic. She does have slough covering the surface of the wound today. Fortunately there is no signs of infection at this time. Overall I have been pleased with the interval change although she still has a lot of Slough I do feel like the Iodoflex was of benefit for her. We did have to change her wrap once in the weeks since I last saw her fortunately after that changed nothing seem to smell or drain through. Patient History Information obtained from Patient. Family History Cancer - Father, Diabetes - Maternal Grandparents, Lung Disease - Father, Stroke - Father, No family history of Heart Disease, Hereditary Spherocytosis, Hypertension, Kidney Disease,  Seizures, Thyroid Problems, Tuberculosis. Social History Never smoker, Marital Status - Married, Alcohol Use - Never, Drug Use - No History, Caffeine Use - Daily. Medical History Eyes Denies history of Cataracts, Glaucoma, Optic Neuritis Ear/Nose/Mouth/Throat Denies history of Chronic sinus problems/congestion, Middle ear problems Hematologic/Lymphatic Denies history of Anemia, Hemophilia, Human Immunodeficiency Virus, Sickle Cell Disease Respiratory Denies history of Aspiration, Asthma, Chronic Obstructive Pulmonary Disease (COPD), Pneumothorax, Sleep Apnea, Tuberculosis Cardiovascular Patient has history of Arrhythmia - a fib, Deep Vein Thrombosis - 13 years ago, Hypertension Denies history of Angina, Congestive Heart Failure, Coronary Artery Disease, Hypotension, Myocardial Infarction, Peripheral THESSALY, MCCULLERS ([id number]) Arterial Disease, Peripheral Venous Disease, Phlebitis, Vasculitis Gastrointestinal Denies history of Cirrhosis , Colitis,  Crohn s, Hepatitis A, Hepatitis B, Hepatitis C Endocrine Denies history of Type I Diabetes, Type II Diabetes Genitourinary Denies history of End Stage Renal Disease Immunological Denies history of Lupus Erythematosus, Raynaud s, Scleroderma Integumentary (Skin) Denies history of History of Burn, History of pressure wounds Musculoskeletal Patient has history of Osteoarthritis Denies history of Gout, Rheumatoid Arthritis, Osteomyelitis Neurologic Denies history of Dementia, Neuropathy, Quadriplegia, Paraplegia, Seizure Disorder Oncologic Patient has history of Received Chemotherapy - past for Wegeners Denies history of Received Radiation Psychiatric Denies history of Anorexia/bulimia, Confinement Anxiety Medical And Surgical History Notes Immunological Wegeners Musculoskeletal OP Review of Systems (ROS) Constitutional Symptoms (General Health) Denies complaints or symptoms of Fever, Chills. Respiratory The patient has no  complaints or symptoms. Cardiovascular Complains or has symptoms of LE edema. Psychiatric The patient has no complaints or symptoms. Objective Constitutional Well-nourished and well-hydrated in no acute distress. Vitals Time Taken: 1:37 PM, Height: 63 in, Weight: 174 lbs, BMI: 30.8, Temperature: 98.3 F, Pulse: 83 bpm, Respiratory Rate: 16 breaths/min, Blood Pressure: 126/74 mmHg. Respiratory normal breathing without difficulty. Psychiatric KAISA, WOFFORD ([id number]) this patient is able to make decisions and demonstrates good insight into disease process. Alert and Oriented x 3. pleasant and cooperative. General Notes: Patient's wound bed currently shows evidence of good granulation at this time. She does have a lot of slough covering the surface however. When I was able to clear away some of the slough the granulation was exposed underline. Obviously this is not as healthy as I would like to see it but we are definitely working toward that state. She is still having some discomfort although she was able to allow me to debride the wound without complication. Integumentary (Hair, Skin) Wound #1 status is Open. Original cause of wound was Bump. The wound is located on the Right,Lateral Lower Leg. The wound measures 2.6cm length x 2.3cm width x 0.5cm depth; 4.697cm^2 area and 2.348cm^3 volume. There is Fat Layer (Subcutaneous Tissue) Exposed exposed. There is no tunneling or undermining noted. There is a medium amount of purulent drainage noted. The wound margin is flat and intact. There is no granulation within the wound bed. There is a large (67-100%) amount of necrotic tissue within the wound bed including Adherent Slough. The periwound skin appearance exhibited: Erythema. The periwound skin appearance did not exhibit: Callus, Crepitus, Excoriation, Induration, Rash, Scarring, Dry/Scaly, Maceration, Atrophie Blanche, Cyanosis, Ecchymosis, Hemosiderin Staining, Mottled, Pallor, Rubor.  The surrounding wound skin color is noted with erythema which is circumferential. Periwound temperature was noted as No Abnormality. The periwound has tenderness on palpation. 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 There was a Excisional Skin/Subcutaneous Tissue Debridement with a total area of 5.98 sq cm performed by STONE III, Kaileena Obi E., PA-C. With the following instrument(s): Curette to remove Viable and Non-Viable tissue/material. Material removed includes Subcutaneous Tissue, Slough, and Biofilm after achieving pain control using Lidocaine 4% Topical Solution. No specimens were taken. A time out was conducted at 13:58, prior to the start of the procedure. A Moderate amount of bleeding was controlled with Pressure. The procedure was tolerated well with a pain level of 0 throughout and a pain level of 0 following the procedure. Post Debridement Measurements: 2.6cm length x 2.3cm width  x 0.6cm depth; 2.818cm^3 volume. Character of Wound/Ulcer Post Debridement is improved. Post procedure Diagnosis Wound #1: Same as Pre-Procedure Plan BRITTNY, SPANGLE ([id number]) Wound Cleansing: Wound #1 Right,Lateral Lower Leg: Clean wound with Normal Saline. Anesthetic (add to Medication List): Wound #1 Right,Lateral Lower Leg: Topical Lidocaine 4% cream applied to wound bed prior to debridement (In Clinic Only). Benzocaine Topical Anesthetic Spray applied to wound bed prior to debridement (In Clinic Only). Primary Wound Dressing: Wound #1 Right,Lateral Lower Leg: Iodoflex Secondary Dressing: Wound #1 Right,Lateral Lower Leg: ABD pad XtraSorb Dressing  Change Frequency: Wound #1 Right,Lateral Lower Leg: Change dressing every week Other: - Nurse visit as needed Follow-up Appointments: Wound #1 Right,Lateral Lower Leg: Return Appointment in 1 week. Nurse Visit as needed Edema Control: Wound #1 Right,Lateral Lower Leg: 3 Layer Compression System - Right Lower Extremity - unna to anchor Elevate legs to the level of the heart and pump ankles as often as possible At this point my suggestion is gonna be that we go ahead and initiate the above wound care measures for the next week. The patient is in agreement the plan. We will subsequently see were things stand at follow-up. We will continue with the three layer compression wrap. Please see above for specific wound care orders. We will see patient for re-evaluation in 1 week(s) here in the clinic. If anything worsens or changes patient will contact our office for additional recommendations. Electronic Signature(s) Signed: 09/19/2018 9:52:35 PM By: Worthy Keeler PA-C Entered By: Worthy Keeler on 09/19/2018 15:27:00 [name] ([id number]) -------------------------------------------------------------------------------- ROS/PFSH Details Patient Name: DAVI, ROTAN. Date of Service: 09/19/2018 1:15 PM Medical Record Number: [id number] Patient Account Number: [id number] Date of Birth/Sex: [birth year] (67 y.o. F) Treating RN: Montey Hora Primary Care Provider: Crist Infante Other Clinician: Referring Provider: Crist Infante Treating Provider/Extender: Melburn Hake, Orrin Yurkovich Weeks in Treatment: 1 Information Obtained From Patient Wound History Do you currently have one or more open woundso Yes How many open wounds do you currently haveo 1 Approximately how long have you had your woundso 6 weeks How have you been treating your wound(s) until nowo unna boots Has your wound(s) ever healed and then re-openedo No Have you had any lab work done in the past montho No Have you tested positive for  an antibiotic resistant organism (MRSA, VRE)o No Have you tested positive for osteomyelitis (bone infection)o No Have you had any tests for circulation on your legso Yes Who ordered the testo [facility] Where was the test doneo 2015 Constitutional Symptoms (General Health) Complaints and Symptoms: Negative for: Fever; Chills Cardiovascular Complaints and Symptoms: Positive for: LE edema Medical History: Positive for: Arrhythmia - a fib; Deep Vein Thrombosis - 13 years ago; Hypertension Negative for: Angina; Congestive Heart Failure; Coronary Artery Disease; Hypotension; Myocardial Infarction; Peripheral Arterial Disease; Peripheral Venous Disease; Phlebitis; Vasculitis Eyes Medical History: Negative for: Cataracts; Glaucoma; Optic Neuritis Ear/Nose/Mouth/Throat Medical History: Negative for: Chronic sinus problems/congestion; Middle ear problems Hematologic/Lymphatic Medical History: Negative for: Anemia; Hemophilia; Human Immunodeficiency Virus; Sickle Cell Disease Respiratory Complaints and Symptoms: No Complaints or Symptoms OTILLIA, CORDONE ([id number]) Medical History: Negative for: Aspiration; Asthma; Chronic Obstructive Pulmonary Disease (COPD); Pneumothorax; Sleep Apnea; Tuberculosis Gastrointestinal Medical History: Negative for: Cirrhosis ; Colitis; Crohnos; Hepatitis A; Hepatitis B; Hepatitis C Endocrine Medical History: Negative for: Type I Diabetes; Type II Diabetes Genitourinary Medical History: Negative for: End Stage Renal Disease Immunological Medical History: Negative for: Lupus Erythematosus; Raynaudos; Scleroderma Past Medical History Notes: Wegeners Integumentary (Skin) Medical History:  Negative for: History of Burn; History of pressure wounds Musculoskeletal Medical History: Positive for: Osteoarthritis Negative for: Gout; Rheumatoid Arthritis; Osteomyelitis Past Medical History Notes: OP Neurologic Medical History: Negative for: Dementia;  Neuropathy; Quadriplegia; Paraplegia; Seizure Disorder Oncologic Medical History: Positive for: Received Chemotherapy - past for Wegeners Negative for: Received Radiation Psychiatric Complaints and Symptoms: No Complaints or Symptoms Medical History: Negative for: Anorexia/bulimia; Confinement Anxiety ALDONA, BRYNER ([id number]) Immunizations Pneumococcal Vaccine: Received Pneumococcal Vaccination: Yes Implantable Devices Family and Social History Cancer: Yes - Father; Diabetes: Yes - Maternal Grandparents; Heart Disease: No; Hereditary Spherocytosis: No; Hypertension: No; Kidney Disease: No; Lung Disease: Yes - Father; Seizures: No; Stroke: Yes - Father; Thyroid Problems: No; Tuberculosis: No; Never smoker; Marital Status - Married; Alcohol Use: Never; Drug Use: No History; Caffeine Use: Daily; Financial Concerns: No; Food, Clothing or Shelter Needs: No; Support System Lacking: No; Transportation Concerns: No; Advanced Directives: Yes (Not Provided); Patient does not want information on Advanced Directives; Medical Power of Attorney: Yes - sister - Derika Eckles (Not Provided) Physician Affirmation I have reviewed and agree with the above information. Electronic Signature(s) Signed: 09/19/2018 5:03:44 PM By: Montey Hora Signed: 09/19/2018 9:52:35 PM By: Worthy Keeler PA-C Entered By: Worthy Keeler on 09/19/2018 15:26:29 [name] ([id number]) -------------------------------------------------------------------------------- SuperBill Details Patient Name: NIEVES, CHAPA. Date of Service: 09/19/2018 Medical Record Number: [id number] Patient Account Number: [id number] Date of Birth/Sex: [birth year] (68 y.o. F) Treating RN: Montey Hora Primary Care Provider: Crist Infante Other Clinician: Referring Provider: Crist Infante Treating Provider/Extender: Melburn Hake, Casen Pryor Weeks in Treatment: 1 Diagnosis Coding ICD-10 Codes Code Description M31.30 Wegener's granulomatosis  without renal involvement L97.812 Non-pressure chronic ulcer of other part of right lower leg with fat layer exposed I48.0 Paroxysmal atrial fibrillation I10 Essential (primary) hypertension Z79.01 Long term (current) use of anticoagulants Facility Procedures CPT4 Code Description: [id number] 11042 - DEB SUBQ TISSUE 20 SQ CM/< ICD-10 Diagnosis Description L93.790 Non-pressure chronic ulcer of other part of right lower leg wi Modifier: th fat layer expo Quantity: 1 sed Physician Procedures CPT4 Code Description: [id number] 11042 - WC PHYS SUBQ TISS 20 SQ CM ICD-10 Diagnosis Description H29.924 Non-pressure chronic ulcer of other part of right lower leg wi Modifier: th fat layer expo Quantity: 1 sed Electronic Signature(s) Signed: 09/19/2018 9:52:35 PM By: Worthy Keeler PA-C Entered By: Worthy Keeler on 09/19/2018 15:27:07

## 2018-09-24 ENCOUNTER — Encounter: Payer: Medicare Other | Attending: Physician Assistant

## 2018-09-24 DIAGNOSIS — Z86718 Personal history of other venous thrombosis and embolism: Secondary | ICD-10-CM | POA: Insufficient documentation

## 2018-09-24 DIAGNOSIS — Z9221 Personal history of antineoplastic chemotherapy: Secondary | ICD-10-CM | POA: Insufficient documentation

## 2018-09-24 DIAGNOSIS — Z7901 Long term (current) use of anticoagulants: Secondary | ICD-10-CM | POA: Diagnosis not present

## 2018-09-24 DIAGNOSIS — I480 Paroxysmal atrial fibrillation: Secondary | ICD-10-CM | POA: Diagnosis not present

## 2018-09-24 DIAGNOSIS — M313 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: Secondary | ICD-10-CM | POA: Diagnosis not present

## 2018-09-24 DIAGNOSIS — I110 Hypertensive heart disease with heart failure: Secondary | ICD-10-CM | POA: Diagnosis not present

## 2018-09-24 DIAGNOSIS — L97812 Non-pressure chronic ulcer of other part of right lower leg with fat layer exposed: Secondary | ICD-10-CM | POA: Insufficient documentation

## 2018-09-26 ENCOUNTER — Ambulatory Visit: Payer: Medicare Other | Admitting: Physician Assistant

## 2018-09-27 ENCOUNTER — Encounter: Payer: Medicare Other | Admitting: Physician Assistant

## 2018-09-27 DIAGNOSIS — I480 Paroxysmal atrial fibrillation: Secondary | ICD-10-CM | POA: Diagnosis not present

## 2018-09-27 DIAGNOSIS — M359 Systemic involvement of connective tissue, unspecified: Secondary | ICD-10-CM | POA: Diagnosis not present

## 2018-09-27 DIAGNOSIS — Z86718 Personal history of other venous thrombosis and embolism: Secondary | ICD-10-CM | POA: Diagnosis not present

## 2018-09-27 DIAGNOSIS — I110 Hypertensive heart disease with heart failure: Secondary | ICD-10-CM | POA: Diagnosis not present

## 2018-09-27 DIAGNOSIS — M313 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: Secondary | ICD-10-CM | POA: Diagnosis not present

## 2018-09-27 DIAGNOSIS — Z7901 Long term (current) use of anticoagulants: Secondary | ICD-10-CM | POA: Diagnosis not present

## 2018-09-27 DIAGNOSIS — L97812 Non-pressure chronic ulcer of other part of right lower leg with fat layer exposed: Secondary | ICD-10-CM | POA: Diagnosis not present

## 2018-09-27 NOTE — Progress Notes (Signed)
[name] ([id number]) Visit Report for 09/24/2018 Arrival Information Details Patient Name: [name], [name]. Date of Service: 09/24/2018 10:45 AM Medical Record Number: [id number] Patient Account Number: [id number] Date of Birth/Sex: 1950/08/29 (68 y.o. F) Treating RN: Harold Barban Primary Care Zayana Salvador: Crist Infante Other Clinician: Referring Bindi Klomp: Crist Infante Treating Alyan Hartline/Extender: Melburn Hake, HOYT Weeks in Treatment: 1 Visit Information History Since Last Visit Added or deleted any medications: No Patient Arrived: Ambulatory Any new allergies or adverse reactions: No Arrival Time: 11:08 Had a fall or experienced change in No Accompanied By: self activities of daily living that may affect Transfer Assistance: None risk of falls: Patient Identification Verified: Yes Signs or symptoms of abuse/neglect since last visito No Secondary Verification Process Yes Hospitalized since last visit: No Completed: Has Dressing in Place as Prescribed: Yes Patient Has Alerts: Yes Has Compression in Place as Prescribed: Yes Patient Alerts: Patient on Blood Pain Present Now: Yes Thinner Eliquis Electronic Signature(s) Signed: 09/26/2018 11:43:02 AM By: Harold Barban Entered By: Harold Barban on 09/24/2018 11:09:56 [name] ([id number]) -------------------------------------------------------------------------------- Clinic Level of Care Assessment Details Patient Name: [name] Date of Service: 09/24/2018 10:45 AM Medical Record Number: [id number] Patient Account Number: [id number] Date of Birth/Sex: [birth year] (68 y.o. F) Treating RN: Harold Barban Primary Care Donny Heffern: Crist Infante Other Clinician: Referring Noelle Sease: Crist Infante Treating Serrina Minogue/Extender: Melburn Hake, HOYT Weeks in Treatment: 1 Clinic Level of Care Assessment Items TOOL 4 Quantity Score []  - Use when only an EandM is performed on FOLLOW-UP visit 0 ASSESSMENTS - Nursing Assessment /  Reassessment X - Reassessment of Co-morbidities (includes updates in patient status) 1 10 X- 1 5 Reassessment of Adherence to Treatment Plan ASSESSMENTS - Wound and Skin Assessment / Reassessment X - Simple Wound Assessment / Reassessment - one wound 1 5 []  - 0 Complex Wound Assessment / Reassessment - multiple wounds []  - 0 Dermatologic / Skin Assessment (not related to wound area) ASSESSMENTS - Focused Assessment []  - Circumferential Edema Measurements - multi extremities 0 []  - 0 Nutritional Assessment / Counseling / Intervention []  - 0 Lower Extremity Assessment (monofilament, tuning fork, pulses) []  - 0 Peripheral Arterial Disease Assessment (using hand held doppler) ASSESSMENTS - Ostomy and/or Continence Assessment and Care []  - Incontinence Assessment and Management 0 []  - 0 Ostomy Care Assessment and Management (repouching, etc.) PROCESS - Coordination of Care X - Simple Patient / Family Education for ongoing care 1 15 []  - 0 Complex (extensive) Patient / Family Education for ongoing care []  - 0 Staff obtains Programmer, systems, Records, Test Results / Process Orders []  - 0 Staff telephones HHA, Nursing Homes / Clarify orders / etc []  - 0 Routine Transfer to another Facility (non-emergent condition) []  - 0 Routine Hospital Admission (non-emergent condition) []  - 0 New Admissions / Biomedical engineer / Ordering NPWT, Apligraf, etc. []  - 0 Emergency Hospital Admission (emergent condition) X- 1 10 Simple Discharge Coordination EBONI, COVAL ([id number]) []  - 0 Complex (extensive) Discharge Coordination PROCESS - Special Needs []  - Pediatric / Minor Patient Management 0 []  - 0 Isolation Patient Management []  - 0 Hearing / Language / Visual special needs []  - 0 Assessment of Community assistance (transportation, D/C planning, etc.) []  - 0 Additional assistance / Altered mentation []  - 0 Support Surface(s) Assessment (bed, cushion, seat, etc.) INTERVENTIONS -  Wound Cleansing / Measurement X - Simple Wound Cleansing - one wound 1 5 []  - 0 Complex Wound Cleansing - multiple wounds X- 1 5 Wound Imaging (photographs -  any number of wounds) []  - 0 Wound Tracing (instead of photographs) X- 1 5 Simple Wound Measurement - one wound []  - 0 Complex Wound Measurement - multiple wounds INTERVENTIONS - Wound Dressings []  - Small Wound Dressing one or multiple wounds 0 X- 1 15 Medium Wound Dressing one or multiple wounds []  - 0 Large Wound Dressing one or multiple wounds []  - 0 Application of Medications - topical []  - 0 Application of Medications - injection INTERVENTIONS - Miscellaneous []  - External ear exam 0 []  - 0 Specimen Collection (cultures, biopsies, blood, body fluids, etc.) []  - 0 Specimen(s) / Culture(s) sent or taken to Lab for analysis []  - 0 Patient Transfer (multiple staff / Civil Service fast streamer / Similar devices) []  - 0 Simple Staple / Suture removal (25 or less) []  - 0 Complex Staple / Suture removal (26 or more) []  - 0 Hypo / Hyperglycemic Management (close monitor of Blood Glucose) []  - 0 Ankle / Brachial Index (ABI) - do not check if billed separately X- 1 5 Vital Signs BYRDIE, MIYAZAKI ([id number]) Has the patient been seen at the hospital within the last three years: Yes Total Score: 80 Level Of Care: New/Established - Level 3 Electronic Signature(s) Signed: 09/26/2018 11:43:02 AM By: Harold Barban Entered By: Harold Barban on 09/24/2018 [place], [name] ([id number]) -------------------------------------------------------------------------------- Encounter Discharge Information Details Patient Name: [name]. Date of Service: 09/24/2018 10:45 AM Medical Record Number: [id number] Patient Account Number: [id number] Date of Birth/Sex: Jun 07, 1951 (68 y.o. F) Treating RN: Harold Barban Primary Care Avi Archuleta: Crist Infante Other Clinician: Referring Oanh Devivo: Crist Infante Treating Adonia Porada/Extender: Melburn Hake,  HOYT Weeks in Treatment: 1 Encounter Discharge Information Items Discharge Condition: Stable Ambulatory Status: Ambulatory Discharge Destination: Home Transportation: Private Auto Accompanied By: self Schedule Follow-up Appointment: Yes Clinical Summary of Care: Electronic Signature(s) Signed: 09/26/2018 11:43:02 AM By: Harold Barban Entered By: Harold Barban on 09/24/2018 11:15:31 [name] ([id number]) -------------------------------------------------------------------------------- Patient/Caregiver Education Details Patient Name: DJENEBA, BARSCH. Date of Service: 09/24/2018 10:45 AM Medical Record Number: [id number] Patient Account Number: [id number] Date of Birth/Gender: 05/24/1951 (68 y.o. F) Treating RN: Harold Barban Primary Care Physician: Crist Infante Other Clinician: Referring Physician: Crist Infante Treating Physician/Extender: Sharalyn Ink in Treatment: 1 Education Assessment Education Provided To: Patient Education Topics Provided Wound/Skin Impairment: Handouts: Caring for Your Ulcer Methods: Demonstration, Explain/Verbal Responses: State content correctly Electronic Signature(s) Signed: 09/26/2018 11:43:02 AM By: Harold Barban Entered By: Harold Barban on 09/24/2018 11:15:15 [name] ([id number]) -------------------------------------------------------------------------------- Wound Assessment Details Patient Name: KERAH, HARDEBECK. Date of Service: 09/24/2018 10:45 AM Medical Record Number: [id number] Patient Account Number: [id number] Date of Birth/Sex: 1951/04/07 (67 y.o. F) Treating RN: Harold Barban Primary Care Shauntell Iglesia: Crist Infante Other Clinician: Referring Tariah Transue: Crist Infante Treating Darcell Yacoub/Extender: Melburn Hake, HOYT Weeks in Treatment: 1 Wound Status Wound Number: 1 Primary Auto-immune Etiology: Wound Location: Right Lower Leg - Lateral Wound Open Wounding Event: Bump Status: Date Acquired: 08/01/2018 Comorbid  Arrhythmia, Deep Vein Thrombosis, Weeks Of Treatment: 1 History: Hypertension, Osteoarthritis, Received Clustered Wound: No Chemotherapy Photos Photo Uploaded By: Harold Barban on 09/24/2018 12:24:27 Wound Measurements Length: (cm) 2.6 Width: (cm) 2.3 Depth: (cm) 0.5 Area: (cm) 4.697 Volume: (cm) 2.348 % Reduction in Area: -8.5% % Reduction in Volume: -8.5% Epithelialization: None Tunneling: No Undermining: No Wound Description Full Thickness Without Exposed Support Classification: Structures Wound Margin: Flat and Intact Exudate Medium Amount: Exudate Type: Purulent Exudate Color: yellow, brown, green Foul Odor After Cleansing: No Slough/Fibrino Yes Wound Bed  Granulation Amount: None Present (0%) Exposed Structure Necrotic Amount: Large (67-100%) Fascia Exposed: No Necrotic Quality: Adherent Slough Fat Layer (Subcutaneous Tissue) Exposed: Yes Tendon Exposed: No Muscle Exposed: No Joint Exposed: No Bone Exposed: No Stuhr, [name] ([id number]) Periwound Skin Texture Texture Color No Abnormalities Noted: No No Abnormalities Noted: No Callus: No Atrophie Blanche: No Crepitus: No Cyanosis: No Excoriation: No Ecchymosis: No Induration: No Erythema: Yes Rash: No Erythema Location: Circumferential Scarring: No Hemosiderin Staining: No Mottled: No Moisture Pallor: No No Abnormalities Noted: No Rubor: No Dry / Scaly: No Maceration: No Temperature / Pain Temperature: No Abnormality Tenderness on Palpation: Yes Wound Preparation Ulcer Cleansing: Rinsed/Irrigated with Saline, Other: soap and water, Topical Anesthetic Applied: Other: lidocaine 4%, Treatment Notes Wound #1 (Right, Lateral Lower Leg) Notes iodoflex, xtrasorb and ABD with 3 ayer wrap with unna to anchor Electronic Signature(s) Signed: 09/26/2018 11:43:02 AM By: Harold Barban Entered By: Harold Barban on 09/24/2018 11:14:28

## 2018-09-29 NOTE — Progress Notes (Signed)
NICCI, VAUGHAN ([id number]) Visit Report for 09/27/2018 Chief Complaint Document Details Patient Name: [name], [name]. Date of Service: 09/27/2018 9:30 AM Medical Record Number: [id number] Patient Account Number: [id number] Date of Birth/Sex: Jan 14, 1951 (67 y.o. F) Treating RN: Montey Hora Primary Care Provider: Crist Infante Other Clinician: Referring Provider: Crist Infante Treating Provider/Extender: Melburn Hake, HOYT Weeks in Treatment: 2 Information Obtained from: Patient Chief Complaint Right LE Ulcer Electronic Signature(s) Signed: 09/29/2018 3:37:27 PM By: Worthy Keeler PA-C Entered By: Worthy Keeler on 09/27/2018 10:04:38 [name] ([id number]) -------------------------------------------------------------------------------- Debridement Details Patient Name: [name] Date of Service: 09/27/2018 9:30 AM Medical Record Number: [id number] Patient Account Number: [id number] Date of Birth/Sex: 04-05-1951 (67 y.o. F) Treating RN: Montey Hora Primary Care Provider: Crist Infante Other Clinician: Referring Provider: Crist Infante Treating Provider/Extender: Melburn Hake, HOYT Weeks in Treatment: 2 Debridement Performed for Wound #1 Right,Lateral Lower Leg Assessment: Performed By: Physician STONE III, HOYT E., PA-C Debridement Type: Debridement Level of Consciousness (Pre- Awake and Alert procedure): Pre-procedure Verification/Time Yes - 10:11 Out Taken: Start Time: 10:11 Pain Control: Lidocaine 4% Topical Solution Total Area Debrided (L x W): 3 (cm) x 2.4 (cm) = 7.2 (cm) Tissue and other material Viable, Non-Viable, Slough, Subcutaneous, Slough debrided: Level: Skin/Subcutaneous Tissue Debridement Description: Excisional Instrument: Curette Bleeding: Minimum Hemostasis Achieved: Pressure End Time: 10:15 Procedural Pain: 0 Post Procedural Pain: 0 Response to Treatment: Procedure was tolerated well Level of Consciousness Awake and  Alert (Post-procedure): Post Debridement Measurements of Total Wound Length: (cm) 3 Width: (cm) 2.4 Depth: (cm) 0.5 Volume: (cm) 2.827 Character of Wound/Ulcer Post Debridement: Improved Post Procedure Diagnosis Same as Pre-procedure Electronic Signature(s) Signed: 09/27/2018 5:34:10 PM By: Montey Hora Signed: 09/29/2018 3:37:27 PM By: Worthy Keeler PA-C Entered By: Montey Hora on 09/27/2018 10:15:21 [name] ([id number]) -------------------------------------------------------------------------------- HPI Details Patient Name: [name], [name]. Date of Service: 09/27/2018 9:30 AM Medical Record Number: [id number] Patient Account Number: [id number] Date of Birth/Sex: [birth year] (67 y.o. 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 She states that she often has areas like this that will come up but normally not to the severity. She's typically able to take care of these herself. However she's been having a lot of trouble since this past summer in particular. 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 She was most recently treated with this by her primary care provider. Fortunately that seems to have cleared up any infection that she may have had. Nonetheless other pertinent medical history includes the chronic long-term use of Eliquis secondary to atrial fibrillation. Her ABI appear to be okay today. She did have a DVT 13 years ago and this lag but has not had anything recently. The Eliquis seems to be beneficial in this regard. Otherwise there's no evidence of active infection  at this time. No fevers, chills, nausea, or vomiting noted at this time. 09/19/18 on evaluation today patient presents for follow-up concerning her ongoing lower extremity wound. This is her second visit here in our clinic. She does have slough covering the surface of the wound today. Fortunately there is no signs of infection at this time. Overall I have been  pleased with the interval change although she still has a lot of Slough I do feel like the Iodoflex was of benefit for her. We did have to change her wrap once in the weeks since I last saw her fortunately after that changed nothing seem to smell or drain through. 09/27/18 on evaluation today patient appears to be doing better in regard to her right lateral lower extremity ulcer. She's been tolerating the dressing changes without complication. Fortunately there is no sign of infection at this time. With that being said we have had to change the wrap at least once in the interim between when we see her and when she comes back in for evaluation due to the amount of drainage an odor. With that being said she is getting very frustrated with the length of time is taken get this to heal. Fortunately there is no evidence of anything worsening and a lot of the slough is improving she has good granulation seems to be peeking out from underneath. She is still having discomfort unfortunately. Electronic Signature(s) Signed: 09/29/2018 3:37:27 PM By: Worthy Keeler PA-C Entered By: Worthy Keeler on 09/27/2018 10:49:50 [name] ([id number]) -------------------------------------------------------------------------------- Physical Exam Details Patient Name: [name], [name]. Date of Service: 09/27/2018 9:30 AM Medical Record Number: [id number] Patient Account Number: [id number] Date of Birth/Sex: [birth year] (67 y.o. F) Treating RN: Montey Hora Primary Care Provider: Crist Infante Other Clinician: Referring Provider: Crist Infante Treating  Provider/Extender: STONE III, HOYT Weeks in Treatment: 2 Constitutional Well-nourished and well-hydrated in no acute distress. Respiratory normal breathing without difficulty. Psychiatric this patient is able to make decisions and demonstrates good insight into disease process. Alert and Oriented x 3. pleasant and cooperative. Notes Patient's wound bed currently shows signs of Slough cover the surface of the wound which did require sharp debridement today. The patient tolerated the debridement without complication and post debridement the wound bed appears to be doing much better which is good news. Her swelling seems to be fairly well controlled with the wrap as well this is also helping with the fluid control in my pinion. That is drainage. Post debridement the wound bed again appears to be doing significantly better. Electronic Signature(s) Signed: 09/29/2018 3:37:27 PM By: Worthy Keeler PA-C Entered By: Worthy Keeler on 09/27/2018 10:50:25 [name] ([id number]) -------------------------------------------------------------------------------- Physician Orders Details Patient Name: [name], [name]. Date of Service: 09/27/2018 9:30 AM Medical Record Number: [id number] Patient Account Number: [id number] Date of Birth/Sex: [birth year] (67 y.o. 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 o May shower with  protection. - Please do not get your wrap wet Anesthetic (add to Medication List) Wound #1 Right,Lateral Lower Leg o Topical Lidocaine 4% cream applied to wound bed prior to debridement (In Clinic Only). o Benzocaine Topical Anesthetic Spray applied to wound bed prior to debridement (In Clinic Only). Primary Wound Dressing Wound #1 Right,Lateral Lower Leg o Iodoflex Secondary Dressing Wound #1 Right,Lateral Lower Leg o ABD pad o Other - carboflex Dressing Change Frequency Wound #1 Right,Lateral Lower Leg o  Change dressing every week o Other: - Nurse visit Monday or Tuesday Follow-up Appointments Wound #1 Right,Lateral Lower Leg o Return Appointment in 1 week. o Nurse Visit as needed - Monday or Tuesday Edema Control Wound #1 Right,Lateral Lower Leg o 3 Layer Compression System - Right Lower Extremity - unna to anchor SHERRIA, RIEMANN ([id number]) o Elevate legs to the level of the heart and pump ankles as often as possible Electronic Signature(s) Signed: 09/27/2018 5:34:10 PM By: Montey Hora Signed: 09/29/2018 3:37:27 PM By: Worthy Keeler PA-C Entered By: Montey Hora on 09/27/2018 10:16:33 [name] ([id number]) -------------------------------------------------------------------------------- Problem List Details Patient Name: [name], [name]. Date of Service: 09/27/2018 9:30 AM Medical Record Number: [id number] Patient Account Number: [id number] Date of Birth/Sex: [birth year] (67 y.o. 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 09/12/2018 No Yes L97.812 Non-pressure chronic ulcer of other part of right lower leg 09/12/2018 No Yes with fat layer exposed I48.0  Paroxysmal atrial fibrillation 09/12/2018 No Yes I10 Essential (primary) hypertension 09/12/2018 No Yes Z79.01 Long term (current) use of anticoagulants 09/12/2018 No Yes Inactive Problems Resolved Problems Electronic Signature(s) Signed: 09/29/2018 3:37:27 PM By: Worthy Keeler PA-C Entered By: Worthy Keeler on 09/27/2018 10:04:34 [name] ([id number]) -------------------------------------------------------------------------------- Progress Note Details Patient Name: [name] Date of Service: 09/27/2018 9:30 AM Medical Record Number: [id number] Patient Account Number: [id number] Date of Birth/Sex: 03-19-1951 (67 y.o. 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 She states that she often has areas like this that will come up but normally not to the severity. She's typically able to take care of these herself. However she's been having a lot of trouble since this past summer in particular. 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 She was most recently treated with this by her primary care provider. Fortunately that seems to have cleared up any infection that she may have had. Nonetheless other pertinent medical history includes the chronic long-term use of Eliquis secondary to atrial fibrillation. Her ABI appear to be okay today. She did have a DVT 13 years ago and this lag but has not had anything recently. The Eliquis seems to be beneficial in this regard. Otherwise there's no evidence of active infection at  this time. No fevers, chills, nausea, or vomiting noted at this time. 09/19/18 on evaluation today patient presents for follow-up concerning her ongoing lower extremity wound. This is her second visit here in our clinic. She does have slough covering the surface of the wound today. Fortunately there is no signs of infection at this time. Overall I have been pleased with the interval change although she still has a lot of Slough I do feel like the Iodoflex was of benefit for her. We did have to change her wrap once in the weeks since I last saw her fortunately after that changed nothing seem to smell or drain through. 09/27/18 on evaluation today  patient appears to be doing better in regard to her right lateral lower extremity ulcer. She's been tolerating the dressing changes without complication. Fortunately there is no sign of infection at this time. With that being said we have had to change the wrap at least once in the interim between when we see her and when she comes back in for evaluation due to the amount of drainage an odor. With that being said she is getting very frustrated with the length of time is taken get this to heal. Fortunately there is no evidence of anything worsening and a lot of the slough is improving she has good granulation seems to be peeking out from underneath. She is still having discomfort unfortunately. Patient History Information obtained from Patient. Family History Cancer - Father, Diabetes - Maternal Grandparents, Lung Disease - Father, Stroke - Father, No family history of Heart Disease, Hereditary Spherocytosis, Hypertension, Kidney Disease, Seizures, Thyroid Problems, Tuberculosis. Social History Never smoker, Marital Status - Married, Alcohol Use - Never, Drug Use - No History, Caffeine Use - Daily. Medical History Eyes Denies history of Cataracts, Glaucoma, Optic Neuritis Ear/Nose/Mouth/Throat Denies history of Chronic sinus problems/congestion, Middle  ear problems Hematologic/Lymphatic KENNIYA, WESTRICH ([id number]) Denies history of Anemia, Hemophilia, Human Immunodeficiency Virus, Sickle Cell Disease Respiratory Denies history of Aspiration, Asthma, Chronic Obstructive Pulmonary Disease (COPD), Pneumothorax, Sleep Apnea, Tuberculosis Cardiovascular Patient has history of Arrhythmia - a fib, Deep Vein Thrombosis - 13 years ago, Hypertension Denies history of Angina, Congestive Heart Failure, Coronary Artery Disease, Hypotension, Myocardial Infarction, Peripheral Arterial Disease, Peripheral Venous Disease, Phlebitis, Vasculitis Gastrointestinal Denies history of Cirrhosis , Colitis, Crohn s, Hepatitis A, Hepatitis B, Hepatitis C Endocrine Denies history of Type I Diabetes, Type II Diabetes Genitourinary Denies history of End Stage Renal Disease Immunological Denies history of Lupus Erythematosus, Raynaud s, Scleroderma Integumentary (Skin) Denies history of History of Burn, History of pressure wounds Musculoskeletal Patient has history of Osteoarthritis Denies history of Gout, Rheumatoid Arthritis, Osteomyelitis Neurologic Denies history of Dementia, Neuropathy, Quadriplegia, Paraplegia, Seizure Disorder Oncologic Patient has history of Received Chemotherapy - past for Wegeners Denies history of Received Radiation Psychiatric Denies history of Anorexia/bulimia, Confinement Anxiety Medical And Surgical History Notes Immunological Wegeners Musculoskeletal OP Review of Systems (ROS) Constitutional Symptoms (General Health) Denies complaints or symptoms of Fever, Chills. Respiratory The patient has no complaints or symptoms. Cardiovascular Complains or has symptoms of LE edema. Psychiatric The patient has no complaints or symptoms. Objective Constitutional Well-nourished and well-hydrated in no acute distress. Vitals Time Taken: 9:34 AM, Height: 63 in, Weight: 174 lbs, BMI: 30.8, Temperature: 98.5 F, Pulse: 79 bpm,  Respiratory [name], Joyce J. ([id number]) Rate: 16 breaths/min, Blood Pressure: 126/58 mmHg. Respiratory normal breathing without difficulty. Psychiatric this patient is able to make decisions and demonstrates good insight into disease process. Alert and Oriented x 3. pleasant and cooperative. General Notes: Patient's wound bed currently shows signs of Slough cover the surface of the wound which did require sharp debridement today. The patient tolerated the debridement without complication and post debridement the wound bed appears to be doing much better which is good news. Her swelling seems to be fairly well controlled with the wrap as well this is also helping with the fluid control in my pinion. That is drainage. Post debridement the wound bed again appears to be doing significantly better. Integumentary (Hair, Skin) Wound #1 status is Open. Original cause of wound was Bump. The wound is located on the Right,Lateral Lower Leg. The wound measures 3cm  length x 2.4cm width x 0.3cm depth; 5.655cm^2 area and 1.696cm^3 volume. There is Fat Layer (Subcutaneous Tissue) Exposed exposed. There is no tunneling or undermining noted. There is a medium amount of purulent drainage noted. The wound margin is flat and intact. There is no granulation within the wound bed. There is a large (67-100%) amount of necrotic tissue within the wound bed including Adherent Slough. The periwound skin appearance did not exhibit: Callus, Crepitus, Excoriation, Induration, Rash, Scarring, Dry/Scaly, Maceration, Atrophie Blanche, Cyanosis, Ecchymosis, Hemosiderin Staining, Mottled, Pallor, Rubor, Erythema. Periwound temperature was noted as No Abnormality. The periwound has tenderness on palpation. 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 There was a Excisional Skin/Subcutaneous Tissue Debridement with a total area of 7.2 sq cm performed by STONE III, HOYT E., PA-C. With the following instrument(s): Curette to remove Viable and Non-Viable tissue/material. Material removed includes Subcutaneous Tissue and Slough and after achieving pain control using Lidocaine 4% Topical Solution. No specimens were taken. A time out was conducted at 10:11, prior to the start of the procedure. A Minimum amount of bleeding was controlled with Pressure. The procedure was tolerated well with a pain level of 0 throughout and a pain level of 0 following the procedure. Post Debridement Measurements: 3cm length x 2.4cm width x 0.5cm depth; 2.827cm^3 volume. Character of Wound/Ulcer Post Debridement is improved. Post procedure Diagnosis Wound #1: Same as Pre-Procedure [name], [name] ([id number]) Plan Wound Cleansing: Wound #1 Right,Lateral Lower Leg: Clean wound with Normal Saline. May shower with protection. - Please do not get your wrap wet Anesthetic (add to Medication List): Wound #1 Right,Lateral Lower Leg: Topical Lidocaine 4% cream applied to wound bed prior to debridement (In Clinic Only). Benzocaine Topical Anesthetic Spray applied to wound bed prior to debridement (In Clinic Only). Primary Wound Dressing: Wound #1 Right,Lateral Lower Leg: Iodoflex Secondary Dressing: Wound #1 Right,Lateral Lower Leg: ABD pad Other - carboflex Dressing Change Frequency: Wound #1 Right,Lateral Lower Leg: Change dressing every week Other: - Nurse visit Monday or Tuesday Follow-up Appointments: Wound #1 Right,Lateral Lower Leg: Return Appointment in 1 week. Nurse Visit as needed - Monday or Tuesday Edema Control: Wound #1 Right,Lateral Lower Leg: 3 Layer Compression System - Right  Lower Extremity - unna to anchor Elevate legs to the level of the heart and pump ankles as often as possible My suggestion at this point is gonna be that we go ahead and continue with the above wound care measures for the next week. The patient is in agreement the plan. We will have her come back for nurse visit on Monday or Tuesday. Subsequently we will add Carla flex over top of the dressing in order to help with odor control hopefully this will be of benefit for her. If anything changes or worsens meantime she will contact the office and let me know. Please see above for specific wound care orders. We will see patient for re-evaluation in 1 week(s) here in the clinic. If anything worsens or changes patient will contact our office for additional recommendations. Electronic Signature(s) Signed: 09/29/2018 3:37:27 PM By: Worthy Keeler PA-C Entered By: Worthy Keeler on 09/27/2018 10:50:59 [name] ([id number]) -------------------------------------------------------------------------------- ROS/PFSH Details Patient Name: [name], [name]. Date of Service:  09/27/2018 9:30 AM Medical Record Number: [id number] Patient Account Number: [id number] Date of Birth/Sex: [birth year] (67 y.o. F) Treating RN: Montey Hora Primary Care Provider: Crist Infante Other Clinician: Referring Provider: Crist Infante Treating Provider/Extender: Melburn Hake, HOYT Weeks in Treatment: 2 Information Obtained From Patient Wound History Do you currently have one or more open woundso Yes How many open wounds do you currently haveo 1 Approximately how long have you had your woundso 6 weeks How have you been treating your wound(s) until nowo unna boots Has your wound(s) ever healed and then re-openedo No Have you had any lab work done in the past montho No Have you tested positive for an antibiotic resistant organism (MRSA, VRE)o No Have you tested positive for osteomyelitis (bone infection)o No Have you had any  tests for circulation on your legso Yes Who ordered the testo [facility] Where was the test doneo 2015 Constitutional Symptoms (General Health) Complaints and Symptoms: Negative for: Fever; Chills Cardiovascular Complaints and Symptoms: Positive for: LE edema Medical History: Positive for: Arrhythmia - a fib; Deep Vein Thrombosis - 13 years ago; Hypertension Negative for: Angina; Congestive Heart Failure; Coronary Artery Disease; Hypotension; Myocardial Infarction; Peripheral Arterial Disease; Peripheral Venous Disease; Phlebitis; Vasculitis Eyes Medical History: Negative for: Cataracts; Glaucoma; Optic Neuritis Ear/Nose/Mouth/Throat Medical History: Negative for: Chronic sinus problems/congestion; Middle ear problems Hematologic/Lymphatic Medical History: Negative for: Anemia; Hemophilia; Human Immunodeficiency Virus; Sickle Cell Disease Respiratory Complaints and Symptoms: No Complaints or Symptoms [name], [name] ([id number]) Medical History: Negative for: Aspiration; Asthma; Chronic Obstructive Pulmonary Disease (COPD); Pneumothorax; Sleep Apnea; Tuberculosis Gastrointestinal Medical History: Negative for: Cirrhosis ; Colitis; Crohnos; Hepatitis A; Hepatitis B; Hepatitis C Endocrine Medical History: Negative for: Type I Diabetes; Type II Diabetes Genitourinary Medical History: Negative for: End Stage Renal Disease Immunological Medical History: Negative for: Lupus Erythematosus; Raynaudos; Scleroderma Past Medical History Notes: Wegeners Integumentary (Skin) Medical History: Negative for: History of Burn; History of pressure wounds Musculoskeletal Medical History: Positive for: Osteoarthritis Negative for: Gout; Rheumatoid Arthritis; Osteomyelitis Past Medical History Notes: OP Neurologic Medical History: Negative for: Dementia; Neuropathy; Quadriplegia; Paraplegia; Seizure Disorder Oncologic Medical History: Positive for: Received Chemotherapy - past for  Wegeners Negative for: Received Radiation Psychiatric Complaints and Symptoms: No Complaints or Symptoms Medical History: Negative for: Anorexia/bulimia; Confinement Anxiety [name], [name] ([id number]) Immunizations Pneumococcal Vaccine: Received Pneumococcal Vaccination: Yes Implantable Devices Family and Social History Cancer: Yes - Father; Diabetes: Yes - Maternal Grandparents; Heart Disease: No; Hereditary Spherocytosis: No; Hypertension: No; Kidney Disease: No; Lung Disease: Yes - Father; Seizures: No; Stroke: Yes - Father; Thyroid Problems: No; Tuberculosis: No; Never smoker; Marital Status - Married; Alcohol Use: Never; Drug Use: No History; Caffeine Use: Daily; Financial Concerns: No; Food, Clothing or Shelter Needs: No; Support System Lacking: No; Transportation Concerns: No; Advanced Directives: Yes (Not Provided); Patient does not want information on Advanced Directives; Medical Power of Attorney: Yes - sister - Farah [place] (Not Provided) Physician Affirmation I have reviewed and agree with the above information. Electronic Signature(s) Signed: 09/27/2018 5:34:10 PM By: Montey Hora Signed: 09/29/2018 3:37:27 PM By: Worthy Keeler PA-C Entered By: Worthy Keeler on 09/27/2018 10:50:07 [name] ([id number]) -------------------------------------------------------------------------------- SuperBill Details Patient Name: [name], [name]. Date of Service: 09/27/2018 Medical Record Number: [id number] Patient Account Number: [id number] Date of Birth/Sex: [birth year] (68 y.o. 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 I48.0 Paroxysmal  atrial fibrillation I10 Essential (primary) hypertension Z79.01 Long term (current) use of anticoagulants Facility Procedures CPT4 Code Description: 26378588 11042 - DEB SUBQ TISSUE 20 SQ CM/< ICD-10 Diagnosis Description F02.774 Non-pressure chronic ulcer of other part of right lower leg wi Modifier: th fat layer expo Quantity: 1 sed Physician Procedures CPT4 Code Description: 1287867 11042 - WC PHYS SUBQ TISS 20 SQ CM ICD-10 Diagnosis Description E72.094 Non-pressure chronic ulcer of other part of right lower leg wi Modifier: th fat layer expo Quantity: 1 sed Electronic Signature(s) Signed: 09/29/2018 3:37:27 PM By: Worthy Keeler PA-C Entered By: Worthy Keeler on 09/27/2018 10:51:07

## 2018-09-30 NOTE — Progress Notes (Signed)
[name], [name] ([id number]) Visit Report for 09/27/2018 Arrival Information Details Patient Name: [name], [name]. Date of Service: 09/27/2018 9:30 AM Medical Record Number: [id number] Patient Account Number: [id number] Date of Birth/Sex: 27-May-1951 (68 y.o. F) Treating RN: [name] Primary Care [name]: [name] Other Clinician: Referring [name]: [name] Treating [name]/Extender: [name], [name] in Treatment: 2 Visit Information History Since Last Visit Added or deleted any medications: No Patient Arrived: Ambulatory Any new allergies or adverse reactions: No Arrival Time: 09:31 Had a fall or experienced change in No Accompanied By: self activities of daily living that may affect Transfer Assistance: None risk of falls: Patient Identification Verified: Yes Signs or symptoms of abuse/neglect since last visito No Secondary Verification Process Yes Hospitalized since last visit: No Completed: Implantable device outside of the clinic excluding No Patient Has Alerts: Yes cellular tissue based products placed in the center Patient Alerts: Patient on Blood since last visit: Thinner Has Dressing in Place as Prescribed: Yes Eliquis Pain Present Now: Yes Electronic Signature(s) Signed: 09/27/2018 12:00:27 PM By: [name] Entered By: [name] on 09/27/2018 [place], [place]. ([id number]) -------------------------------------------------------------------------------- Encounter Discharge Information Details Patient Name: [name], [name]. Date of Service: 09/27/2018 9:30 AM Medical Record Number: [id number] Patient Account Number: [id number] Date of Birth/Sex: 18-Oct-1950 (68 y.o. F) Treating RN: [name] Primary Care Aayliah Rotenberry: [name] Other Clinician: Referring Harlow Basley: [name] Treating Omayra Tulloch/Extender: [name], [name] in Treatment: 2 Encounter Discharge Information Items Post  Procedure Vitals Discharge Condition: Stable Temperature (F): 98.5 Ambulatory Status: Ambulatory Pulse (bpm): 79 Discharge Destination: Home Respiratory Rate (breaths/min): 18 Transportation: Private Auto Blood Pressure (mmHg): 126/58 Accompanied By: self Schedule Follow-up Appointment: Yes Clinical Summary of Care: Electronic Signature(s) Signed: 09/27/2018 5:34:10 PM By: [name] Entered By: [name] on 09/27/2018 10:19:57 [name] ([id number]) -------------------------------------------------------------------------------- Lower Extremity Assessment Details Patient Name: [name], [name]. Date of Service: 09/27/2018 9:30 AM Medical Record Number: [id number] Patient Account Number: [id number] Date of Birth/Sex: 1950/12/11 (68 y.o. F) Treating RN: [name] Primary Care Argie Lober: [name] Other Clinician: Referring Katrell Milhorn: [name] Treating Valente Fosberg/Extender: [name], [name] in Treatment: 2 Edema Assessment Assessed: [Left: No] [Right: Yes] Edema: [Left: N] [Right: o] Vascular Assessment Pulses: Dorsalis Pedis Palpable: [Right:Yes] Posterior Tibial Extremity colors, hair growth, and conditions: Extremity Color: [Right:Normal] Hair Growth on Extremity: [Right:Yes] Temperature of Extremity: [Right:Warm] Capillary Refill: [Right:< 3 seconds] Electronic Signature(s) Signed: 09/30/2018 8:00:18 AM By: [name] Entered By: [name] on 09/27/2018 09:53:48 [name] ([id number]) -------------------------------------------------------------------------------- Multi Wound Chart Details Patient Name: [name]. Date of Service: 09/27/2018 9:30 AM Medical Record Number: [id number] Patient Account Number: [id number] Date of Birth/Sex: 1951/03/26 (68 y.o. 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] [N/A:N/A] Comorbid History: [1:Arrhythmia, Deep Vein Thrombosis, Hypertension, Osteoarthritis, Received Chemotherapy] [N/A:N/A] Date Acquired: [1:08/01/2018] [N/A:N/A] Kaufman of Treatment: [1:2] [N/A:N/A] Wound Status: [1:Open] [N/A:N/A] Measurements L x W x D [1:3x2.4x0.3] [N/A:N/A] (cm) Area (cm) : [1:5.655] [N/A:N/A] Volume (cm) : [1:1.696] [N/A:N/A] % Reduction in Area: [1:-30.70%] [N/A:N/A] % Reduction in Volume: [1:21.60%] [N/A:N/A] Classification: [1:Full Thickness Without Exposed Support Structures] [N/A:N/A] Exudate Amount: [  1:Medium] [N/A:N/A] Exudate Type: [1:Purulent] [N/A:N/A] Exudate Color: [1:yellow, brown, green] [N/A:N/A] Wound Margin: [1:Flat and Intact] [N/A:N/A] Granulation Amount: [1:None Present (0%)] [N/A:N/A] Necrotic Amount: [1:Large (67-100%)] [N/A:N/A] Exposed Structures: [1:Fat Layer (Subcutaneous Tissue) Exposed: Yes Fascia: No Tendon: No Muscle: No Joint: No Bone: No] [N/A:N/A] Epithelialization: [1:None] [N/A:N/A] Periwound Skin Texture: [1:Excoriation: No Induration: No Callus: No Crepitus: No] [N/A:N/A] Rash: No Scarring: No Periwound Skin Moisture: Maceration: No N/A N/A Dry/Scaly: No Periwound Skin Color: Atrophie Blanche: No N/A N/A Cyanosis: No Ecchymosis: No Erythema: No Hemosiderin Staining: No Mottled: No Pallor: No Rubor: No Temperature: No Abnormality N/A N/A Tenderness on Palpation: Yes N/A N/A Wound Preparation: Ulcer Cleansing: N/A N/A Rinsed/Irrigated with Saline, Other: soap and water Topical Anesthetic Applied: Other: lidocaine 4% Treatment Notes Electronic Signature(s) Signed:  09/27/2018 5:34:10 PM By: [name] Entered By: [name] on 09/27/2018 [place], [place] ([id number]) -------------------------------------------------------------------------------- Multi-Disciplinary Care Plan Details Patient Name: [name], [name]. Date of Service: 09/27/2018 9:30 AM Medical Record Number: [id number] Patient Account Number: [id number] Date of Birth/Sex: [birth year] (68 y.o. F) Treating RN: [name] Primary Care Ellison Leisure: [name] Other Clinician: Referring Caoilainn Sacks: [name] Treating Anthoni Geerts/Extender: [name], [name] in Treatment: 2 Active Inactive Necrotic Tissue Nursing Diagnoses: Impaired tissue integrity related to necrotic/devitalized tissue Knowledge deficit related to management of necrotic/devitalized tissue Goals: Necrotic/devitalized tissue will be minimized in the wound bed Date Initiated: 09/12/2018 Target Resolution Date: 10/14/2018 Goal Status: Active Interventions: Assess patient pain level pre-, during and post procedure and prior to discharge Provide education on necrotic tissue and debridement process Treatment Activities: Apply topical anesthetic as ordered : 09/12/2018 Notes: Orientation to the Wound Care Program Nursing Diagnoses: Knowledge deficit related to the wound healing center program Goals: Patient/caregiver will verbalize understanding of the [facility] Date Initiated: 09/12/2018 Target Resolution Date: 10/14/2018 Goal Status: Active Interventions: Provide education on orientation to the wound center Notes: Pain, Acute or Chronic Nursing Diagnoses: Pain Management - Non-cyclic Acute (Procedural) Goals: Patient will verbalize adequate pain control and receive pain control interventions during procedures as needed [name], [name] ([id number]) Date Initiated: 09/12/2018 Target Resolution Date: 10/14/2018 Goal Status: Active Interventions: Assess comfort goal upon  admission Encourage patient to take pain medications as prescribed Treatment Activities: Administer pain control measures as ordered : 09/12/2018 Notes: Wound/Skin Impairment Nursing Diagnoses: Impaired tissue integrity Goals: Ulcer/skin breakdown will have a volume reduction of 30% by week 4 Date Initiated: 09/12/2018 Target Resolution Date: 10/14/2018 Goal Status: Active Interventions: Assess patient/caregiver ability to obtain necessary supplies Assess patient/caregiver ability to perform ulcer/skin care regimen upon admission and as needed Assess ulceration(s) every visit Treatment Activities: Referred to DME Christl Fessenden for dressing supplies : 09/12/2018 Skin care regimen initiated : 09/12/2018 Notes: Electronic Signature(s) Signed: 09/27/2018 5:34:10 PM By: [name] Entered By: [name] on 09/27/2018 [place], Joyce J. ([id number]) -------------------------------------------------------------------------------- Pain Assessment Details Patient Name: [name], [name]. Date of Service: 09/27/2018 9:30 AM Medical Record Number: [id number] Patient Account Number: [id number] Date of Birth/Sex: [birth year] (68 y.o. F) Treating RN: [name] Primary Care Lezette Kitts: [name] Other Clinician: Referring Stiles Maxcy: [name] Treating Jaiden Wahab/Extender: [name], [name] in Treatment: 2 Active Problems Location of Pain Severity and Description of Pain Patient Has Paino Yes Site Locations Rate the pain. Current Pain Level: 3 Pain Management and Medication Current Pain Management: Electronic Signature(s) Signed: 09/27/2018 12:00:27 PM By: Paulla Fore, RRT, Kaufman Signed: 09/27/2018 5:34:10 PM By: [name] Entered By: [name] on 09/27/2018  09:34:22 [name], [name] ([id number]) -------------------------------------------------------------------------------- Patient/Caregiver Education Details Patient Name: [name], [name]. Date of Service: 09/27/2018 9:30 AM Medical Record Number: [id number] Patient Account Number: [id number] Date of Birth/Gender: [birth year] (68 y.o. F) Treating RN: [name] Primary Care Physician: [name] Other Clinician: Referring Physician: Crist Kaufman Treating Physician/Extender: Sharalyn Ink in Treatment: 2 Education Assessment Education Provided To: Patient Education Topics Provided Venous: Handouts: Other: wrap precautions Methods: Explain/Verbal Responses: State content correctly Wound/Skin Impairment: Handouts: Other: possibility of skin sub Methods: Explain/Verbal Responses: State content correctly Electronic Signature(s) Signed: 09/27/2018 5:34:10 PM By: [name] Entered By: [name] on 09/27/2018 10:18:57 [name] ([id number]) -------------------------------------------------------------------------------- Wound Assessment Details Patient Name: [name], [name]. Date of Service: 09/27/2018 9:30 AM Medical Record Number: [id number] Patient Account Number: [id number] Date of Birth/Sex: [birth year] (68 y.o. F) Treating RN: [name] Primary Care Layne Lebon: [name] Other Clinician: Referring Katriona Schmierer: [name] Treating Tahjae Clausing/Extender: [name], [name] in Treatment: 2 Wound Status Wound Number: 1 Primary Auto-immune Etiology: Wound Location: Right Lower Leg - Lateral Wound Open Wounding Event: Bump Status: Date Acquired: 08/01/2018 Comorbid Arrhythmia, Deep Vein Thrombosis, Kaufman Of Treatment: 2 History: Hypertension, Osteoarthritis, Received Clustered Wound: No Chemotherapy Photos Photo Uploaded By: [name] on 09/27/2018 15:32:04 Wound Measurements Length: (cm) 3 Width: (cm) 2.4 Depth: (cm) 0.3 Area: (cm) 5.655 Volume: (cm) 1.696 % Reduction in Area: -30.7% % Reduction in Volume: 21.6% Epithelialization: None Tunneling: No Undermining: No Wound Description Full Thickness Without Exposed  Support Foul Odo Classification: Structures Slough/F Wound Margin: Flat and Intact Exudate Medium Amount: Exudate Type: Purulent Exudate Color: yellow, brown, green r After Cleansing: No ibrino Yes Wound Bed Granulation Amount: None Present (0%) Exposed Structure Necrotic Amount: Large (67-100%) Fascia Exposed: No Necrotic Quality: Adherent Slough Fat Layer (Subcutaneous Tissue) Exposed: Yes Tendon Exposed: No Muscle Exposed: No Joint Exposed: No Bone Exposed: No [place], Joyce J. ([id number]) Periwound Skin Texture Texture Color No Abnormalities Noted: No No Abnormalities Noted: No Callus: No Atrophie Blanche: No Crepitus: No Cyanosis: No Excoriation: No Ecchymosis: No Induration: No Erythema: No Rash: No Hemosiderin Staining: No Scarring: No Mottled: No Pallor: No Moisture Rubor: No No Abnormalities Noted: No Dry / Scaly: No Temperature / Pain Maceration: No Temperature: No Abnormality Tenderness on Palpation: Yes Wound Preparation Ulcer Cleansing: Rinsed/Irrigated with Saline, Other: soap and water, Topical Anesthetic Applied: Other: lidocaine 4%, Treatment Notes Wound #1 (Right, Lateral Lower Leg) Notes iodoflex, carboflex and ABD with 3 layer wrap with unna to anchor Electronic Signature(s) Signed: 09/30/2018 8:00:18 AM By: [name] Entered By: [name] on 09/27/2018 09:52:23 [name] ([id number]) -------------------------------------------------------------------------------- Vitals Details Patient Name: [name]. Date of Service: 09/27/2018 9:30 AM Medical Record Number: [id number] Patient Account Number: [id number] Date of Birth/Sex: 01/18/1951 (68 y.o. F) Treating RN: [name] Primary Care Jasmine Mcbeth: [name] Other Clinician: Referring Aztlan Coll: [name] Treating Kostas Marrow/Extender: [name], [name] in Treatment: 2 Vital Signs Time Taken: 09:34 Temperature (F): 98.5 Height (in): 63 Pulse (bpm):  79 Weight (lbs): 174 Respiratory Rate (breaths/min): 16 Body Mass Index (BMI): 30.8 Blood Pressure (mmHg): 126/58 Reference Range: 80 - 120 mg / dl Airway Electronic Signature(s) Signed: 09/27/2018 12:00:27 PM By: [name] Entered By: [name] on 09/27/2018 09:38:46

## 2018-10-01 ENCOUNTER — Ambulatory Visit: Payer: Medicare Other

## 2018-10-03 ENCOUNTER — Encounter: Payer: Medicare Other | Admitting: Physician Assistant

## 2018-10-03 DIAGNOSIS — M359 Systemic involvement of connective tissue, unspecified: Secondary | ICD-10-CM | POA: Diagnosis not present

## 2018-10-03 DIAGNOSIS — M313 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: Secondary | ICD-10-CM | POA: Diagnosis not present

## 2018-10-03 DIAGNOSIS — I110 Hypertensive heart disease with heart failure: Secondary | ICD-10-CM | POA: Diagnosis not present

## 2018-10-03 DIAGNOSIS — Z7901 Long term (current) use of anticoagulants: Secondary | ICD-10-CM | POA: Diagnosis not present

## 2018-10-03 DIAGNOSIS — L97812 Non-pressure chronic ulcer of other part of right lower leg with fat layer exposed: Secondary | ICD-10-CM | POA: Diagnosis not present

## 2018-10-03 DIAGNOSIS — Z86718 Personal history of other venous thrombosis and embolism: Secondary | ICD-10-CM | POA: Diagnosis not present

## 2018-10-03 DIAGNOSIS — I480 Paroxysmal atrial fibrillation: Secondary | ICD-10-CM | POA: Diagnosis not present

## 2018-10-04 ENCOUNTER — Ambulatory Visit: Payer: Medicare Other | Admitting: Physician Assistant

## 2018-10-06 NOTE — Progress Notes (Signed)
LEETA, GRIMME ([id number]) Visit Report for 10/03/2018 Chief Complaint Document Details Patient Name: [name], [name]. Date of Service: 10/03/2018 3:30 PM Medical Record Number: [id number] Patient Account Number: [id number] Date of Birth/Sex: [birth year] (68 y.o. F) Treating RN: Montey Hora Primary Care Provider: Crist Infante Other Clinician: Referring Provider: Crist Infante Treating Provider/Extender: Melburn Hake, HOYT Weeks in Treatment: 3 Information Obtained from: Patient Chief Complaint Right LE Ulcer Electronic Signature(s) Signed: 10/04/2018 8:42:52 AM By: Worthy Keeler PA-C Entered By: Worthy Keeler on 10/03/2018 16:39:17 [name], [name] ([id number]) -------------------------------------------------------------------------------- Cellular or Tissue Based Product Details Patient Name: [name], [name]. Date of Service: 10/03/2018 3:30 PM Medical Record Number: [id number] Patient Account Number: [id number] Date of Birth/Sex: 08-15-1951 (68 y.o. F) Treating RN: Montey Hora Primary Care Provider: Crist Infante Other Clinician: Referring Provider: Crist Infante Treating Provider/Extender: Melburn Hake, HOYT Weeks in Treatment: 3 Cellular or Tissue Based Wound #1 Right,Lateral Lower Leg Product Type Applied to: Performed By: Physician STONE III, HOYT E., PA-C Cellular or Tissue Based Epifix Product Type: Level of Consciousness (Pre- Awake and Alert procedure): Pre-procedure Verification/Time Yes - 16:15 Out Taken: Location: trunk / arms / legs Wound Size (sq cm): 7 Product Size (sq cm): 8 Waste Size (sq cm): 0 Amount of Product Applied (sq cm): 8 Instrument Used: Forceps, Scissors Lot #: [id number] Expiration Date: 05/22/2023 Fenestrated: No Reconstituted: Yes Solution Type: normal saline Solution Amount: 5 ML Lot #: [id number] Solution Expiration Date: 07/21/2020 Secured: Yes Secured With: Steri-Strips Dressing Applied: Yes Primary Dressing: mepitel one Procedural  Pain: 0 Post Procedural Pain: 0 Response to Treatment: Procedure was tolerated well Level of Consciousness (Post- Awake and Alert procedure): Post Procedure Diagnosis Same as Pre-procedure Electronic Signature(s) Signed: 10/03/2018 4:47:47 PM By: Montey Hora Entered By: Montey Hora on 10/03/2018 16:17:56 [name], [name] ([id number]) -------------------------------------------------------------------------------- Debridement Details Patient Name: [name]. Date of Service: 10/03/2018 3:30 PM Medical Record Number: [id number] Patient Account Number: [id number] Date of Birth/Sex: 05/23/1951 (68 y.o. F) Treating RN: Montey Hora Primary Care Provider: Crist Infante Other Clinician: Referring Provider: Crist Infante Treating Provider/Extender: Melburn Hake, HOYT Weeks in Treatment: 3 Debridement Performed for Wound #1 Right,Lateral Lower Leg Assessment: Performed By: Physician STONE III, HOYT E., PA-C Debridement Type: Debridement Level of Consciousness (Pre- Awake and Alert procedure): Pre-procedure Verification/Time Yes - 16:05 Out Taken: Start Time: 16:05 Pain Control: Lidocaine 4% Topical Solution Total Area Debrided (L x W): 2.8 (cm) x 2.5 (cm) = 7 (cm) Tissue and other material Viable, Non-Viable, Slough, Subcutaneous, Slough debrided: Level: Skin/Subcutaneous Tissue Debridement Description: Excisional Instrument: Curette Bleeding: Minimum Hemostasis Achieved: Pressure End Time: 16:09 Procedural Pain: 0 Post Procedural Pain: 0 Response to Treatment: Procedure was tolerated well Level of Consciousness Awake and Alert (Post-procedure): Post Debridement Measurements of Total Wound Length: (cm) 2.8 Width: (cm) 2.5 Depth: (cm) 0.4 Volume: (cm) 2.199 Character of Wound/Ulcer Post Debridement: Improved Post Procedure Diagnosis Same as Pre-procedure Electronic Signature(s) Signed: 10/03/2018 4:47:47 PM By: Montey Hora Signed: 10/04/2018 8:42:52 AM By:  Worthy Keeler PA-C Entered By: Montey Hora on 10/03/2018 16:08:42 [name] ([id number]) -------------------------------------------------------------------------------- HPI Details Patient Name: [name], [name]. Date of Service: 10/03/2018 3:30 PM Medical Record Number: [id number] Patient Account Number: [id number] Date of Birth/Sex: 07/29/1951 (68 y.o. 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 She states that she often has areas like this that will come up but normally not to the severity. She's typically able to take care of these herself. However she's been having a lot of trouble since this past summer in particular. 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 She was most recently treated with this by her primary care provider. Fortunately that seems to have cleared up any infection that she may have had. Nonetheless other pertinent medical history includes the chronic long-term use of Eliquis secondary to atrial fibrillation. Her ABI appear to be okay today. She did have a DVT 13 years ago and this lag but has not had anything recently. The Eliquis seems to be beneficial in this regard. Otherwise there's no evidence of active infection at this time. No fevers, chills, nausea, or vomiting noted at this time. 09/19/18 on evaluation today patient presents for follow-up concerning her ongoing lower extremity wound. This is her second visit here in our clinic. She does have slough covering the surface of the wound today. Fortunately there is no signs of infection at this time.  Overall I have been pleased with the interval change although she still has a lot of Slough I do feel like the Iodoflex was of benefit for her. We did have to change her wrap once in the weeks since I last saw her fortunately after that changed nothing seem to smell or drain through. 09/27/18 on evaluation today patient appears to be doing better in regard to her right lateral lower extremity ulcer. She's been tolerating the dressing changes without complication. Fortunately there is no sign of infection at this time. With that being said we have had to change the wrap at least once in the interim between when we see her and when she comes back in for evaluation due to the amount of drainage an odor. With that being said she is getting very frustrated with the length of time is taken get this to heal. Fortunately there is no evidence of anything worsening and a lot of the slough is improving she has good granulation seems to be peeking out from underneath. She is still having discomfort unfortunately. 10/03/18 on evaluation today patient's wound actually appears to be doing better from the standpoint of feeling in. With that being said she actually does have some slough covering the surface of the wound unfortunately. This is going to require sharp debridement prior to application of EpiFix. Electronic Signature(s) Signed: 10/04/2018 8:42:52 AM By: Worthy Keeler PA-C Entered By: Worthy Keeler on 10/03/2018 23:48:24 [name] ([id number]) -------------------------------------------------------------------------------- Physical Exam Details Patient Name: [name], [name]. Date of Service: 10/03/2018 3:30 PM Medical Record Number: [id number] Patient Account Number: [id number] Date of Birth/Sex: 09/18/1950 (68 y.o. F) Treating RN: Montey Hora Primary Care Provider: Crist Infante Other Clinician: Referring Provider: Crist Infante Treating Provider/Extender: STONE III, HOYT Weeks in Treatment:  3 Constitutional Well-nourished and well-hydrated in no acute distress. Respiratory normal breathing without difficulty. Psychiatric this patient is able to make decisions and demonstrates good insight into disease process. Alert and Oriented x 3. pleasant and cooperative. Notes Patient's wound bed currently did show evidence of good granulation underneath the slough although she did have to get to the sharp debridement she was having discomfort unfortunately she did not take her pain medication as she normally would prior to  coming in for evaluation. Nonetheless we were able to get through this in the first application of EpiFix was applied today. This was secured with Steri-Strips and mepitel. Electronic Signature(s) Signed: 10/04/2018 8:42:52 AM By: Worthy Keeler PA-C Entered By: Worthy Keeler on 10/03/2018 23:49:02 [name] ([id number]) -------------------------------------------------------------------------------- Physician Orders Details Patient Name: [name], [name]. Date of Service: 10/03/2018 3:30 PM Medical Record Number: [id number] Patient Account Number: [id number] Date of Birth/Sex: [birth year] (68 y.o. F) Treating RN: Montey Hora Primary Care Provider: Crist Infante Other Clinician: Referring Provider: Crist Infante Treating Provider/Extender: Melburn Hake, HOYT Weeks in Treatment: 3 Verbal / Phone Orders: No Diagnosis Coding Wound Cleansing Wound #1 Right,Lateral Lower Leg o Clean wound with Normal Saline. o May shower with protection. - Please do not get your wrap wet Anesthetic (add to Medication List) Wound #1 Right,Lateral Lower Leg o Topical Lidocaine 4% cream applied to wound bed prior to debridement (In Clinic Only). o Benzocaine Topical Anesthetic Spray applied to wound bed prior to debridement (In Clinic Only). Primary Wound Dressing Wound #1 Right,Lateral Lower Leg o Other: - Epifix Secondary Dressing Wound #1 Right,Lateral Lower  Leg o ABD pad o Other - carboflex Dressing Change Frequency Wound #1 Right,Lateral Lower Leg o Change dressing every week o Other: - if needed Follow-up Appointments Wound #1 Right,Lateral Lower Leg o Return Appointment in 1 week. Edema Control Wound #1 Right,Lateral Lower Leg o 3 Layer Compression System - Right Lower Extremity - unna to anchor o Elevate legs to the level of the heart and pump ankles as often as possible Advanced Therapies Wound #1 Right,Lateral Lower Leg o EpiFix application in clinic; including contact layer, fixation with steri strips, dry gauze and cover dressing. Electronic Signature(s) Signed: 10/03/2018 4:47:47 PM By: Montey Hora Signed: 10/04/2018 8:42:52 AM By: Princella Ion, Joyce J. ([id number]) Entered By: Montey Hora on 10/03/2018 16:19:56 [name], [name] ([id number]) -------------------------------------------------------------------------------- Problem List Details Patient Name: AMAN, BATLEY. Date of Service: 10/03/2018 3:30 PM Medical Record Number: [id number] Patient Account Number: [id number] Date of Birth/Sex: May 15, 1951 (68 y.o. 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 09/12/2018 No Yes L97.812 Non-pressure chronic ulcer of other part of right lower leg 09/12/2018 No Yes with fat layer exposed I48.0 Paroxysmal atrial fibrillation 09/12/2018 No Yes I10 Essential (primary) hypertension 09/12/2018 No Yes Z79.01 Long term (current) use of anticoagulants 09/12/2018 No Yes Inactive Problems Resolved Problems Electronic Signature(s) Signed: 10/04/2018 8:42:52 AM By: Worthy Keeler PA-C Entered By: Worthy Keeler on 10/03/2018  [name], [name] ([id number]) -------------------------------------------------------------------------------- Progress Note Details Patient Name: [name] Date of Service: 10/03/2018 3:30 PM Medical Record Number: [id number] Patient Account Number: [id number] Date of Birth/Sex: 16-Nov-1950 (68 y.o. 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 She states that she often has areas like this that will come up but normally not to the severity. She's typically able to take care of these herself. However she's been having a lot of  trouble since this past summer in particular. 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 She was most recently treated with this by her primary care provider. Fortunately that seems to have cleared up any infection that she may have had. Nonetheless other pertinent medical history includes the chronic long-term use of Eliquis secondary to atrial fibrillation. Her ABI appear to be okay today. She did have a DVT 13 years ago and this lag but has not had anything recently. The Eliquis seems to be beneficial in this regard. Otherwise there's no evidence of active infection at this time. No fevers, chills, nausea, or vomiting noted at this time. 09/19/18 on evaluation today patient presents for follow-up concerning her ongoing lower extremity wound. This is her second visit here in our clinic. She does have slough covering the surface of the wound today. Fortunately there is no signs of infection  at this time. Overall I have been pleased with the interval change although she still has a lot of Slough I do feel like the Iodoflex was of benefit for her. We did have to change her wrap once in the weeks since I last saw her fortunately after that changed nothing seem to smell or drain through. 09/27/18 on evaluation today patient appears to be doing better in regard to her right lateral lower extremity ulcer. She's been tolerating the dressing changes without complication. Fortunately there is no sign of infection at this time. With that being said we have had to change the wrap at least once in the interim between when we see her and when she comes back in for evaluation due to the amount of drainage an odor. With that being said she is getting very frustrated with the length of time is taken get this to heal. Fortunately there is no evidence of anything worsening and a lot of the slough is improving she has good granulation seems to be peeking out from underneath. She is still having discomfort unfortunately. 10/03/18 on evaluation today patient's wound actually appears to be doing better from the standpoint of feeling in. With that being said she actually does have some slough covering the surface of the wound unfortunately. This is going to require sharp debridement prior to application of EpiFix. Patient History Information obtained from Patient. Family History Cancer - Father, Diabetes - Maternal Grandparents, Lung Disease - Father, Stroke - Father, No family history of Heart Disease, Hereditary Spherocytosis, Hypertension, Kidney Disease, Seizures, Thyroid Problems, Tuberculosis. Social History Never smoker, Marital Status - Married, Alcohol Use - Never, Drug Use - No History, Caffeine Use - Daily. Medical History Eyes NISA, DECAIRE ([id number]) Denies history of Cataracts, Glaucoma, Optic Neuritis Ear/Nose/Mouth/Throat Denies history of Chronic sinus problems/congestion, Middle ear  problems Hematologic/Lymphatic Denies history of Anemia, Hemophilia, Human Immunodeficiency Virus, Sickle Cell Disease Respiratory Denies history of Aspiration, Asthma, Chronic Obstructive Pulmonary Disease (COPD), Pneumothorax, Sleep Apnea, Tuberculosis Cardiovascular Patient has history of Arrhythmia - a fib, Deep Vein Thrombosis - 13 years ago, Hypertension Denies history of Angina, Congestive Heart Failure, Coronary Artery Disease, Hypotension, Myocardial Infarction, Peripheral Arterial Disease, Peripheral Venous Disease, Phlebitis, Vasculitis Gastrointestinal Denies history of Cirrhosis , Colitis, Crohn s, Hepatitis A, Hepatitis B, Hepatitis C Endocrine Denies history of Type I Diabetes, Type II Diabetes Genitourinary Denies history of End Stage Renal Disease Immunological Denies history of Lupus Erythematosus, Raynaud s, Scleroderma Integumentary (Skin) Denies history of History of Burn, History of pressure wounds Musculoskeletal  Patient has history of Osteoarthritis Denies history of Gout, Rheumatoid Arthritis, Osteomyelitis Neurologic Denies history of Dementia, Neuropathy, Quadriplegia, Paraplegia, Seizure Disorder Oncologic Patient has history of Received Chemotherapy - past for Wegeners Denies history of Received Radiation Psychiatric Denies history of Anorexia/bulimia, Confinement Anxiety Medical And Surgical History Notes Immunological Wegeners Musculoskeletal OP Review of Systems (ROS) Constitutional Symptoms (General Health) Denies complaints or symptoms of Fever, Chills. Respiratory The patient has no complaints or symptoms. Cardiovascular The patient has no complaints or symptoms. Psychiatric The patient has no complaints or symptoms. Objective [name], [name] ([id number]) Constitutional Well-nourished and well-hydrated in no acute distress. Vitals Time Taken: 3:26 PM, Height: 63 in, Weight: 174 lbs, BMI: 30.8, Temperature: 99.6 F, Pulse: 88 bpm,  Respiratory Rate: 16 breaths/min, Blood Pressure: 135/76 mmHg. Respiratory normal breathing without difficulty. Psychiatric this patient is able to make decisions and demonstrates good insight into disease process. Alert and Oriented x 3. pleasant and cooperative. General Notes: Patient's wound bed currently did show evidence of good granulation underneath the slough although she did have to get to the sharp debridement she was having discomfort unfortunately she did not take her pain medication as she normally would prior to coming in for evaluation. Nonetheless we were able to get through this in the first application of EpiFix was applied today. This was secured with Steri-Strips and mepitel. Integumentary (Hair, Skin) Wound #1 status is Open. Original cause of wound was Bump. The wound is located on the Right,Lateral Lower Leg. The wound measures 2.8cm length x 2.5cm width x 0.2cm depth; 5.498cm^2 area and 1.1cm^3 volume. There is Fat Layer (Subcutaneous Tissue) Exposed exposed. There is no tunneling or undermining noted. There is a medium amount of purulent drainage noted. The wound margin is flat and intact. There is no granulation within the wound bed. There is a large (67-100%) amount of necrotic tissue within the wound bed including Eschar and Adherent Slough. The periwound skin appearance did not exhibit: Callus, Crepitus, Excoriation, Induration, Rash, Scarring, Dry/Scaly, Maceration, Atrophie Blanche, Cyanosis, Ecchymosis, Hemosiderin Staining, Mottled, Pallor, Rubor, Erythema. Periwound temperature was noted as No Abnormality. The periwound has tenderness on palpation. 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 There was a Excisional Skin/Subcutaneous Tissue Debridement with a total area of 7 sq cm performed by STONE III, HOYT E., PA-C. With the following instrument(s): Curette to remove Viable and Non-Viable tissue/material. Material removed includes Subcutaneous Tissue and Slough and after achieving pain control using Lidocaine 4% Topical Solution. No specimens were taken. A time out was conducted at 16:05, prior to the start of the procedure. A Minimum amount of bleeding was controlled with Pressure. [name], [name] ([id number]) The procedure was tolerated well with a pain level of 0 throughout and a pain level of 0 following the procedure. Post Debridement Measurements: 2.8cm length x 2.5cm width x 0.4cm depth; 2.199cm^3 volume. Character of Wound/Ulcer Post Debridement is improved. 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 A skin graft procedure using a bioengineered skin substitute/cellular or tissue based product was performed by STONE III, HOYT E., PA-C with the following instrument(s): Forceps and Scissors. Epifix was applied and secured with Steri-Strips.  8 sq cm of product was utilized and 0 sq cm was wasted. Post Application, mepitel one was applied. A Time Out was conducted at 16:15, prior to the start of the procedure. The procedure was tolerated well with a pain level of 0 throughout and a pain level of 0 following the procedure. Post procedure Diagnosis Wound #1: Same as Pre-Procedure . Plan Wound Cleansing: Wound #1 Right,Lateral Lower Leg: Clean wound with Normal Saline. May shower with protection. - Please do not get your wrap wet Anesthetic (add to Medication List): Wound #1 Right,Lateral Lower Leg: Topical Lidocaine 4% cream applied to wound bed prior to debridement (In Clinic  Only). Benzocaine Topical Anesthetic Spray applied to wound bed prior to debridement (In Clinic Only). Primary Wound Dressing: Wound #1 Right,Lateral Lower Leg: Other: - Epifix Secondary Dressing: Wound #1 Right,Lateral Lower Leg: ABD pad Other - carboflex Dressing Change Frequency: Wound #1 Right,Lateral Lower Leg: Change dressing every week Other: - if needed Follow-up Appointments: Wound #1 Right,Lateral Lower Leg: Return Appointment in 1 week. Edema Control: Wound #1 Right,Lateral Lower Leg: 3 Layer Compression System - Right Lower Extremity - unna to anchor Elevate legs to the level of the heart and pump ankles as often as possible Advanced Therapies: Wound #1 Right,Lateral Lower Leg: EpiFix application in clinic; including contact layer, fixation with steri strips, dry gauze and cover dressing. My suggestion currently is gonna be that we continue with the above wound care measures for the next week. We will subsequently see were things stand at follow-up. If anything changes or worsens in the meantime patient will contact the office and let me know. MARRIAN, BELLS ([id number]) Please see above for specific wound care orders. We will see patient for re-evaluation in 1 week(s) here in the clinic. If anything worsens or changes patient will contact our office for additional recommendations. Electronic Signature(s) Signed: 10/04/2018 8:42:52 AM By: Worthy Keeler PA-C Entered By: Worthy Keeler on 10/04/2018 00:19:42 [name] ([id number]) -------------------------------------------------------------------------------- ROS/PFSH Details Patient Name: BRYER, GOTTSCH. Date of Service: 10/03/2018 3:30 PM Medical Record Number: [id number] Patient Account Number: [id number] Date of Birth/Sex: 1951/02/10 (68 y.o. F) Treating RN: Montey Hora Primary Care Provider: Crist Infante Other Clinician: Referring Provider: Crist Infante Treating Provider/Extender: Melburn Hake,  HOYT Weeks in Treatment: 3 Information Obtained From Patient Wound History Do you currently have one or more open woundso Yes How many open wounds do you currently haveo 1 Approximately how long have you had your woundso 6 weeks How have you been treating your wound(s) until nowo unna boots Has your wound(s) ever healed and then re-openedo No Have you had any lab work done in the past montho No Have you tested positive for an antibiotic resistant organism (MRSA, VRE)o No Have you tested positive for osteomyelitis (bone infection)o No Have you had any tests for circulation on your legso Yes Who ordered the testo [facility] Where was the test doneo 2015 Constitutional Symptoms (General Health) Complaints and Symptoms: Negative for: Fever; Chills Eyes Medical History: Negative for: Cataracts; Glaucoma; Optic Neuritis Ear/Nose/Mouth/Throat Medical History: Negative for: Chronic sinus problems/congestion; Middle ear problems Hematologic/Lymphatic Medical History: Negative for: Anemia; Hemophilia; Human Immunodeficiency Virus; Sickle Cell Disease Respiratory Complaints and Symptoms: No Complaints or Symptoms Medical History: Negative for: Aspiration; Asthma; Chronic Obstructive Pulmonary Disease (COPD); Pneumothorax; Sleep Apnea; Tuberculosis Cardiovascular Complaints and Symptoms: No Complaints or Symptoms AMINTA, SAKURAI ([id number]) Medical History: Positive for: Arrhythmia - a fib; Deep Vein Thrombosis - 13 years ago; Hypertension  Negative for: Angina; Congestive Heart Failure; Coronary Artery Disease; Hypotension; Myocardial Infarction; Peripheral Arterial Disease; Peripheral Venous Disease; Phlebitis; Vasculitis Gastrointestinal Medical History: Negative for: Cirrhosis ; Colitis; Crohnos; Hepatitis A; Hepatitis B; Hepatitis C Endocrine Medical History: Negative for: Type I Diabetes; Type II Diabetes Genitourinary Medical History: Negative for: End Stage Renal  Disease Immunological Medical History: Negative for: Lupus Erythematosus; Raynaudos; Scleroderma Past Medical History Notes: Wegeners Integumentary (Skin) Medical History: Negative for: History of Burn; History of pressure wounds Musculoskeletal Medical History: Positive for: Osteoarthritis Negative for: Gout; Rheumatoid Arthritis; Osteomyelitis Past Medical History Notes: OP Neurologic Medical History: Negative for: Dementia; Neuropathy; Quadriplegia; Paraplegia; Seizure Disorder Oncologic Medical History: Positive for: Received Chemotherapy - past for Wegeners Negative for: Received Radiation Psychiatric Complaints and Symptoms: No Complaints or Symptoms Medical History: Negative for: Anorexia/bulimia; Confinement Anxiety MARIATERESA, BATRA ([id number]) Immunizations Pneumococcal Vaccine: Received Pneumococcal Vaccination: Yes Implantable Devices Family and Social History Cancer: Yes - Father; Diabetes: Yes - Maternal Grandparents; Heart Disease: No; Hereditary Spherocytosis: No; Hypertension: No; Kidney Disease: No; Lung Disease: Yes - Father; Seizures: No; Stroke: Yes - Father; Thyroid Problems: No; Tuberculosis: No; Never smoker; Marital Status - Married; Alcohol Use: Never; Drug Use: No History; Caffeine Use: Daily; Financial Concerns: No; Food, Clothing or Shelter Needs: No; Support System Lacking: No; Transportation Concerns: No; Advanced Directives: Yes (Not Provided); Patient does not want information on Advanced Directives; Medical Power of Attorney: Yes - sister - Cariah Salatino (Not Provided) Physician Affirmation I have reviewed and agree with the above information. Electronic Signature(s) Signed: 10/04/2018 8:42:52 AM By: Worthy Keeler PA-C Signed: 10/04/2018 4:52:28 PM By: Montey Hora Entered By: Worthy Keeler on 10/03/2018 23:48:43 [name] ([id number]) -------------------------------------------------------------------------------- SuperBill  Details Patient Name: ARLEE, BOSSARD. Date of Service: 10/03/2018 Medical Record Number: [id number] Patient Account Number: [id number] Date of Birth/Sex: [birth year] (68 y.o. 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 I48.0 Paroxysmal atrial fibrillation I10 Essential (primary) hypertension Z79.01 Long term (current) use of anticoagulants Facility Procedures CPT4 Code Description: 50354656 15271 - SKIN SUB GRAFT TRNK/ARM/LEG ICD-10 Diagnosis Description L97.812 Non-pressure chronic ulcer of other part of right lower leg wit Modifier: h fat layer expo Quantity: 1 sed Physician Procedures CPT4 Code Description: 8127517 15271 - WC PHYS SKIN SUB GRAFT TRNK/ARM/LEG ICD-10 Diagnosis Description G01.749 Non-pressure chronic ulcer of other part of right lower leg with Modifier: fat layer expo Quantity: 1 sed Electronic Signature(s) Signed: 10/04/2018 8:42:52 AM By: Worthy Keeler PA-C Entered By: Worthy Keeler on 10/04/2018 00:19:51

## 2018-10-07 NOTE — Progress Notes (Signed)
[name], [name] ([id number]) Visit Report for 10/03/2018 Arrival Information Details Patient Name: [name], [name]. Date of Service: 10/03/2018 3:30 PM Medical Record Number: [id number] Patient Account Number: [id number] Date of Birth/Sex: 01-24-1951 (68 y.o. F) Treating RN: Army Melia Primary Care Ayn Domangue: Crist Infante Other Clinician: Referring Nyomi Howser: Crist Infante Treating Calub Tarnow/Extender: Melburn Hake, HOYT Weeks in Treatment: 3 Visit Information History Since Last Visit Added or deleted any medications: No Patient Arrived: Ambulatory Any new allergies or adverse reactions: No Arrival Time: 15:27 Had a fall or experienced change in No Accompanied By: self activities of daily living that may affect Transfer Assistance: None risk of falls: Patient Identification Verified: Yes Signs or symptoms of abuse/neglect since last visito No Patient Has Alerts: Yes Hospitalized since last visit: No Patient Alerts: Patient on Blood Thinner Has Dressing in Place as Prescribed: Yes Eliquis Pain Present Now: Yes Electronic Signature(s) Signed: 10/07/2018 8:46:05 AM By: Army Melia Entered By: Army Melia on 10/03/2018 15:28:18 [name] ([id number]) -------------------------------------------------------------------------------- Encounter Discharge Information Details Patient Name: [name], [name]. Date of Service: 10/03/2018 3:30 PM Medical Record Number: [id number] Patient Account Number: [id number] Date of Birth/Sex: May 24, 1951 (68 y.o. F) Treating RN: Montey Hora Primary Care Secily Walthour: Crist Infante Other Clinician: Referring Therasa Lorenzi: Crist Infante Treating Lachele Lievanos/Extender: Melburn Hake, HOYT Weeks in Treatment: 3 Encounter Discharge Information Items Post Procedure Vitals Discharge Condition: Stable Temperature (F): 99.6 Ambulatory Status: Ambulatory Pulse (bpm): 88 Discharge Destination: Home Respiratory Rate (breaths/min): 18 Transportation: Private Auto Blood  Pressure (mmHg): 135/76 Accompanied By: self Schedule Follow-up Appointment: Yes Clinical Summary of Care: Electronic Signature(s) Signed: 10/03/2018 4:42:07 PM By: Montey Hora Entered By: Montey Hora on 10/03/2018 16:42:07 [name] ([id number]) -------------------------------------------------------------------------------- Lower Extremity Assessment Details Patient Name: [name], [name]. Date of Service: 10/03/2018 3:30 PM Medical Record Number: [id number] Patient Account Number: [id number] Date of Birth/Sex: 05-13-1951 (68 y.o. F) Treating RN: Army Melia Primary Care Dracen Reigle: Crist Infante Other Clinician: Referring Dail Meece: Crist Infante Treating Akeila Lana/Extender: Melburn Hake, HOYT Weeks in Treatment: 3 Edema Assessment Assessed: [Left: No] [Right: No] Edema: [Left: N] [Right: o] Vascular Assessment Pulses: Dorsalis Pedis Palpable: [Right:Yes] Posterior Tibial Extremity colors, hair growth, and conditions: Extremity Color: [Right:Normal] Hair Growth on Extremity: [Right:No] Temperature of Extremity: [Right:Warm] Capillary Refill: [Right:< 3 seconds] Toe Nail Assessment Left: Right: Thick: No Discolored: No Deformed: No Improper Length and Hygiene: No Electronic Signature(s) Signed: 10/07/2018 8:46:05 AM By: Army Melia Entered By: Army Melia on 10/03/2018 15:37:39 [name], [name] ([id number]) -------------------------------------------------------------------------------- Multi Wound Chart Details Patient Name: [name]. Date of Service: 10/03/2018 3:30 PM Medical Record Number: [id number] Patient Account Number: [id number] Date of Birth/Sex: 06-17-1951 (68 y.o. F) Treating RN: Montey Hora Primary Care Daylen Lipsky: Crist Infante Other Clinician: Referring Eryca Bolte: Crist Infante Treating Jalie Eiland/Extender: Melburn Hake, HOYT Weeks in Treatment: 3 Vital Signs Height(in): 63 Pulse(bpm): 88 Weight(lbs): 174 Blood Pressure(mmHg): 135/76 Body Mass  Index(BMI): 31 Temperature(F): 99.6 Respiratory Rate 16 (breaths/min): Photos: [1:No Photos] [N/A:N/A] Wound Location: [1:Right Lower Leg - Lateral] [N/A:N/A] Wounding Event: [1:Bump] [N/A:N/A] Primary Etiology: [1:Auto-immune] [N/A:N/A] Comorbid History: [1:Arrhythmia, Deep Vein Thrombosis, Hypertension, Osteoarthritis, Received Chemotherapy] [N/A:N/A] Date Acquired: [1:08/01/2018] [N/A:N/A] Weeks of Treatment: [1:3] [N/A:N/A] Wound Status: [1:Open] [N/A:N/A] Measurements L x W x D [1:2.8x2.5x0.2] [N/A:N/A] (cm) Area (cm) : [1:5.498] [N/A:N/A] Volume (cm) : [1:1.1] [N/A:N/A] % Reduction in Area: [1:-27.00%] [N/A:N/A] % Reduction in Volume: [1:49.20%] [N/A:N/A] Classification: [1:Full Thickness Without Exposed Support Structures] [N/A:N/A] Exudate Amount: [1:Medium] [N/A:N/A] Exudate Type: [1:Purulent] [N/A:N/A] Exudate Color: [1:yellow, brown, green] [N/A:N/A] Wound  Margin: [1:Flat and Intact] [N/A:N/A] Granulation Amount: [1:None Present (0%)] [N/A:N/A] Necrotic Amount: [1:Large (67-100%)] [N/A:N/A] Necrotic Tissue: [1:Eschar, Adherent Slough] [N/A:N/A] Exposed Structures: [1:Fat Layer (Subcutaneous Tissue) Exposed: Yes Fascia: No Tendon: No Muscle: No Joint: No Bone: No] [N/A:N/A] Epithelialization: [1:Small (1-33%)] [N/A:N/A] Periwound Skin Texture: [1:Excoriation: No Induration: No Callus: No] [N/A:N/A] Crepitus: No Rash: No Scarring: No Periwound Skin Moisture: Maceration: No N/A N/A Dry/Scaly: No Periwound Skin Color: Atrophie Blanche: No N/A N/A Cyanosis: No Ecchymosis: No Erythema: No Hemosiderin Staining: No Mottled: No Pallor: No Rubor: No Temperature: No Abnormality N/A N/A Tenderness on Palpation: Yes N/A N/A Wound Preparation: Ulcer Cleansing: N/A N/A Rinsed/Irrigated with Saline, Other: soap and water Topical Anesthetic Applied: Other: lidocaine 4% Treatment Notes Electronic Signature(s) Signed: 10/03/2018 4:47:47 PM By: Montey Hora Entered By: Montey Hora on 10/03/2018 [name], Joyce J. ([id number]) -------------------------------------------------------------------------------- [place] Details Patient Name: [name], [name]. Date of Service: 10/03/2018 3:30 PM Medical Record Number: [id number] Patient Account Number: [id number] Date of Birth/Sex: 04-Feb-1951 (68 y.o. F) Treating RN: Montey Hora Primary Care Laron Boorman: Crist Infante Other Clinician: Referring Lamon Rotundo: Crist Infante Treating Guled Gahan/Extender: Melburn Hake, HOYT Weeks in Treatment: 3 Active Inactive Necrotic Tissue Nursing Diagnoses: Impaired tissue integrity related to necrotic/devitalized tissue Knowledge deficit related to management of necrotic/devitalized tissue Goals: Necrotic/devitalized tissue will be minimized in the wound bed Date Initiated: 09/12/2018 Target Resolution Date: 10/14/2018 Goal Status: Active Interventions: Assess patient pain level pre-, during and post procedure and prior to discharge Provide education on necrotic tissue and debridement process Treatment Activities: Apply topical anesthetic as ordered : 09/12/2018 Notes: Orientation to the Wound Care Program Nursing Diagnoses: Knowledge deficit related to the wound healing center program Goals: Patient/caregiver will verbalize understanding of the [place] Date Initiated: 09/12/2018 Target Resolution Date: 10/14/2018 Goal Status: Active Interventions: Provide education on orientation to the wound center Notes: Pain, Acute or Chronic Nursing Diagnoses: Pain Management - Non-cyclic Acute (Procedural) Goals: Patient will verbalize adequate pain control and receive pain control interventions during procedures as needed ALINE, WESCHE ([id number]) Date Initiated: 09/12/2018 Target Resolution Date: 10/14/2018 Goal Status: Active Interventions: Assess comfort goal upon admission Encourage patient to take pain  medications as prescribed Treatment Activities: Administer pain control measures as ordered : 09/12/2018 Notes: Wound/Skin Impairment Nursing Diagnoses: Impaired tissue integrity Goals: Ulcer/skin breakdown will have a volume reduction of 30% by week 4 Date Initiated: 09/12/2018 Target Resolution Date: 10/14/2018 Goal Status: Active Interventions: Assess patient/caregiver ability to obtain necessary supplies Assess patient/caregiver ability to perform ulcer/skin care regimen upon admission and as needed Assess ulceration(s) every visit Treatment Activities: Referred to DME Deniyah Dillavou for dressing supplies : 09/12/2018 Skin care regimen initiated : 09/12/2018 Notes: Electronic Signature(s) Signed: 10/03/2018 4:47:47 PM By: Montey Hora Entered By: Montey Hora on 10/03/2018 [place], [place]. ([id number]) -------------------------------------------------------------------------------- Pain Assessment Details Patient Name: [name], [name]. Date of Service: 10/03/2018 3:30 PM Medical Record Number: [id number] Patient Account Number: [id number] Date of Birth/Sex: Oct 26, 1950 (68 y.o. F) Treating RN: Army Melia Primary Care Halea Lieb: Crist Infante Other Clinician: Referring Isaiah Cianci: Crist Infante Treating Bristol Osentoski/Extender: Melburn Hake, HOYT Weeks in Treatment: 3 Active Problems Location of Pain Severity and Description of Pain Patient Has Paino Yes Site Locations Pain Location: Pain in Ulcers With Dressing Change: Yes Duration of the Pain. Constant / Intermittento Intermittent Rate the pain. Current Pain Level: 6 Character of Pain Describe the Pain: Sharp Pain Management and Medication Current Pain Management: Electronic Signature(s) Signed: 10/07/2018 8:46:05 AM By: Army Melia  Entered By: Army Melia on 10/03/2018 15:28:47 [name] ([id number]) -------------------------------------------------------------------------------- Patient/Caregiver Education  Details Patient Name: [name], [name] Date of Service: 10/03/2018 3:30 PM Medical Record Number: [id number] Patient Account Number: [id number] Date of Birth/Gender: 04-20-1951 (68 y.o. F) Treating RN: Montey Hora Primary Care Physician: Crist Infante Other Clinician: Referring Physician: Crist Infante Treating Physician/Extender: Sharalyn Ink in Treatment: 3 Education Assessment Education Provided To: Patient Education Topics Provided Venous: Handouts: Other: leg elevation Methods: Explain/Verbal Responses: State content correctly Wound/Skin Impairment: Handouts: Other: Epifix Methods: Explain/Verbal Responses: State content correctly Electronic Signature(s) Signed: 10/03/2018 4:47:47 PM By: Montey Hora Entered By: Montey Hora on 10/03/2018 16:42:30 [name] ([id number]) -------------------------------------------------------------------------------- Wound Assessment Details Patient Name: [name], [name]. Date of Service: 10/03/2018 3:30 PM Medical Record Number: [id number] Patient Account Number: [id number] Date of Birth/Sex: 07/08/1951 (68 y.o. F) Treating RN: Army Melia Primary Care Rudi Bunyard: Crist Infante Other Clinician: Referring Temitope Griffing: Crist Infante Treating Sayan Aldava/Extender: Melburn Hake, HOYT Weeks in Treatment: 3 Wound Status Wound Number: 1 Primary Auto-immune Etiology: Wound Location: Right Lower Leg - Lateral Wound Open Wounding Event: Bump Status: Date Acquired: 08/01/2018 Comorbid Arrhythmia, Deep Vein Thrombosis, Weeks Of Treatment: 3 History: Hypertension, Osteoarthritis, Received Clustered Wound: No Chemotherapy Photos Photo Uploaded By: Gretta Cool, BSN, RN, CWS, [name] on 10/04/2018 15:41:43 Wound Measurements Length: (cm) 2.8 Width: (cm) 2.5 Depth: (cm) 0.2 Area: (cm) 5.498 Volume: (cm) 1.1 % Reduction in Area: -27% % Reduction in Volume: 49.2% Epithelialization: Small (1-33%) Tunneling: No Undermining: No Wound  Description Full Thickness Without Exposed Support Foul Od Classification: Structures Slough/ Wound Margin: Flat and Intact Exudate Medium Amount: Exudate Type: Purulent Exudate Color: yellow, brown, green or After Cleansing: No Fibrino Yes Wound Bed Granulation Amount: None Present (0%) Exposed Structure Necrotic Amount: Large (67-100%) Fascia Exposed: No Necrotic Quality: Eschar, Adherent Slough Fat Layer (Subcutaneous Tissue) Exposed: Yes Tendon Exposed: No Muscle Exposed: No Joint Exposed: No Bone Exposed: No [name], Joyce J. ([id number]) Periwound Skin Texture Texture Color No Abnormalities Noted: No No Abnormalities Noted: No Callus: No Atrophie Blanche: No Crepitus: No Cyanosis: No Excoriation: No Ecchymosis: No Induration: No Erythema: No Rash: No Hemosiderin Staining: No Scarring: No Mottled: No Pallor: No Moisture Rubor: No No Abnormalities Noted: No Dry / Scaly: No Temperature / Pain Maceration: No Temperature: No Abnormality Tenderness on Palpation: Yes Wound Preparation Ulcer Cleansing: Rinsed/Irrigated with Saline, Other: soap and water, Topical Anesthetic Applied: Other: lidocaine 4%, Treatment Notes Wound #1 (Right, Lateral Lower Leg) Notes Epifix applied in clinic today, carboflex and ABD with 3 layer wrap with unna to anchor Electronic Signature(s) Signed: 10/07/2018 8:46:05 AM By: Army Melia Entered By: Army Melia on 10/03/2018 15:37:15 [name] ([id number]) -------------------------------------------------------------------------------- Vitals Details Patient Name: [name]. Date of Service: 10/03/2018 3:30 PM Medical Record Number: [id number] Patient Account Number: [id number] Date of Birth/Sex: November 16, 1950 (68 y.o. F) Treating RN: Army Melia Primary Care Jodeci Rini: Crist Infante Other Clinician: Referring Dominyk Law: Crist Infante Treating Gisela Lea/Extender: Melburn Hake, HOYT Weeks in Treatment: 3 Vital Signs Time  Taken: 15:26 Temperature (F): 99.6 Height (in): 63 Pulse (bpm): 88 Weight (lbs): 174 Respiratory Rate (breaths/min): 16 Body Mass Index (BMI): 30.8 Blood Pressure (mmHg): 135/76 Reference Range: 80 - 120 mg / dl Airway Electronic Signature(s) Signed: 10/07/2018 8:46:05 AM By: Army Melia Entered By: Army Melia on 10/03/2018 15:29:11

## 2018-10-09 DIAGNOSIS — M8589 Other specified disorders of bone density and structure, multiple sites: Secondary | ICD-10-CM | POA: Diagnosis not present

## 2018-10-09 DIAGNOSIS — I8312 Varicose veins of left lower extremity with inflammation: Secondary | ICD-10-CM | POA: Diagnosis not present

## 2018-10-09 DIAGNOSIS — Z79899 Other long term (current) drug therapy: Secondary | ICD-10-CM | POA: Diagnosis not present

## 2018-10-09 DIAGNOSIS — Z862 Personal history of diseases of the blood and blood-forming organs and certain disorders involving the immune mechanism: Secondary | ICD-10-CM | POA: Diagnosis not present

## 2018-10-09 DIAGNOSIS — I8311 Varicose veins of right lower extremity with inflammation: Secondary | ICD-10-CM | POA: Diagnosis not present

## 2018-10-09 DIAGNOSIS — M313 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: Secondary | ICD-10-CM | POA: Diagnosis not present

## 2018-10-10 DIAGNOSIS — I110 Hypertensive heart disease with heart failure: Secondary | ICD-10-CM | POA: Diagnosis not present

## 2018-10-10 DIAGNOSIS — Z7901 Long term (current) use of anticoagulants: Secondary | ICD-10-CM | POA: Diagnosis not present

## 2018-10-10 DIAGNOSIS — L97812 Non-pressure chronic ulcer of other part of right lower leg with fat layer exposed: Secondary | ICD-10-CM | POA: Diagnosis not present

## 2018-10-10 DIAGNOSIS — I480 Paroxysmal atrial fibrillation: Secondary | ICD-10-CM | POA: Diagnosis not present

## 2018-10-10 DIAGNOSIS — M313 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: Secondary | ICD-10-CM | POA: Diagnosis not present

## 2018-10-10 DIAGNOSIS — Z86718 Personal history of other venous thrombosis and embolism: Secondary | ICD-10-CM | POA: Diagnosis not present

## 2018-10-11 NOTE — Progress Notes (Signed)
[name] ([id number]) Visit Report for 10/10/2018 Arrival Information Details Patient Name: [name], [name]. Date of Service: 10/10/2018 10:45 AM Medical Record Number: [id number] Patient Account Number: [id number] Date of Birth/Sex: [birth year] (68 y.o. F) Treating RN: [name] Primary Care [name]: [name] Other Clinician: Referring [name]: [name] Treating [name]/Extender: [name] Kaufman Kaufman Treatment: 4 Visit Information History Since Last Visit Added or deleted any medications: No Patient Arrived: Ambulatory Any new allergies or adverse reactions: No Arrival Time: 11:18 Had a fall or experienced change Kaufman No Accompanied By: self activities of daily living that may affect Transfer Assistance: None risk of falls: Patient Identification Verified: Yes Signs or symptoms of abuse/neglect since last visito No Secondary Verification Process Yes Hospitalized since last visit: No Completed: Implantable device outside of the clinic excluding No Patient Has Alerts: Yes cellular tissue based products placed Kaufman the center Patient Alerts: Patient on Blood since last visit: Thinner Has Compression Kaufman Place as Prescribed: Yes Eliquis Pain Present Now: No Electronic Signature(s) Signed: 10/10/2018 5:17:15 PM By: [name], BSN, RN, CWS, [name] RN, BSN Entered By: [name], BSN, RN, CWS, [name] on 10/10/2018 11:18:28 [name] ([id number]) -------------------------------------------------------------------------------- Encounter Discharge Information Details Patient Name: [name], [name]. Date of Service: 10/10/2018 10:45 AM Medical Record Number: [id number] Patient Account Number: [id number] Date of Birth/Sex: June 12, 1951 (68 y.o. F) Treating RN: [name] Primary Care [name]: [name] Other Clinician: Referring [name]: [name] Treating [name]/Extender: [name] Kaufman Kaufman Treatment: 4 Encounter Discharge Information Items Discharge Condition:  Stable Ambulatory Status: Ambulatory Discharge Destination: Home Transportation: Private Auto Accompanied By: self Schedule Follow-up Appointment: Yes Clinical Summary of Care: Electronic Signature(s) Signed: 10/10/2018 5:17:15 PM By: [name], BSN, RN, CWS, [name] RN, BSN Entered By: [name], BSN, RN, CWS, [name] on 10/10/2018 11:35:38 [name] ([id number]) -------------------------------------------------------------------------------- Patient/Caregiver Education Details Patient Name: [name], [name]. Date of Service: 10/10/2018 10:45 AM Medical Record Number: [id number] Patient Account Number: [id number] Date of Birth/Gender: Jan 21, 1951 (68 y.o. F) Treating RN: [name] Primary Care Physician: [name] Other Clinician: Referring Physician: Crist Kaufman Treating Physician/Extender: Joyce Ink Kaufman Treatment: 4 Education Assessment Education Provided To: Patient Education Topics Provided Wound/Skin Impairment: Handouts: Caring for Your Ulcer Methods: Demonstration, Explain/Verbal Responses: State content correctly Electronic Signature(s) Signed: 10/10/2018 5:17:15 PM By: [name], BSN, RN, CWS, [name] RN, BSN Entered By: [name], BSN, RN, CWS, [name] on 10/10/2018 11:35:28 [name] ([id number]) -------------------------------------------------------------------------------- Wound Assessment Details Patient Name: [name], [name]. Date of Service: 10/10/2018 10:45 AM Medical Record Number: [id number] Patient Account Number: [id number] Date of Birth/Sex: Apr 15, 1951 (68 y.o. F) Treating RN: [name] Primary Care Kyren Vaux: [name] Other Clinician: Referring [name]: [name] Treating [name]/Extender: [name] Kaufman Kaufman Treatment: 4 Wound Status Wound Number: 1 Primary Auto-immune Etiology: Wound Location: Right Lower Leg - Lateral Wound Open Wounding Event: Bump Status: Date Acquired: 08/01/2018 Comorbid Arrhythmia, Deep Vein Thrombosis, Kaufman Of  Treatment: 4 History: Hypertension, Osteoarthritis, Received Clustered Wound: No Chemotherapy Wound Measurements Length: (cm) 2.8 Width: (cm) 2.5 Depth: (cm) 0.2 Area: (cm) 5.498 Volume: (cm) 1.1 % Reduction Kaufman Area: -27% % Reduction Kaufman Volume: 49.2% Epithelialization: Small (1-33%) Wound Description Full Thickness Without Exposed Support Classification: Structures Wound Margin: Flat and Intact Exudate Medium Amount: Exudate Type: Purulent Exudate Color: yellow, brown, green Foul Odor After Cleansing: No Slough/Fibrino Yes Wound Bed Granulation Amount: None Present (0%) Exposed Structure Necrotic Amount: Large (67-100%) Fascia Exposed: No Necrotic Quality: Eschar, Adherent Slough Fat Layer (Subcutaneous  Tissue) Exposed: Yes Tendon Exposed: No Muscle Exposed: No Joint Exposed: No Bone Exposed: No Periwound Skin Texture Texture Color No Abnormalities Noted: No No Abnormalities Noted: No Callus: No Atrophie Blanche: No Crepitus: No Cyanosis: No Excoriation: No Ecchymosis: No Induration: No Erythema: No Rash: No Hemosiderin Staining: No Scarring: No Mottled: No Pallor: No Moisture Rubor: No No Abnormalities Noted: No [name] ([id number]) Dry / Scaly: No Temperature / Pain Maceration: No Temperature: No Abnormality Tenderness on Palpation: Yes Wound Preparation Ulcer Cleansing: Rinsed/Irrigated with Saline, Other: soap and water, Topical Anesthetic Applied: Other: lidocaine 4%, Assessment Notes Patient has Epifix applied, wound not measured. Treatment Notes Wound #1 (Right, Lateral Lower Leg) 1. Cleansed with: Clean wound with Normal Saline Cleanse wound with antibacterial soap and water 2. Anesthetic Topical Lidocaine 4% cream to wound bed prior to debridement Notes Epifix still intact applied gauze, carboflex and foam for padding around ankle and achilles. 3 LAYER TO RIGHT Electronic Signature(s) Signed: 10/10/2018 5:17:15 PM By:  [name], BSN, RN, CWS, [name] RN, BSN Entered By: [name], BSN, RN, CWS, [name] on 10/10/2018 11:32:56

## 2018-10-15 ENCOUNTER — Encounter: Payer: Medicare Other | Admitting: Physician Assistant

## 2018-10-15 DIAGNOSIS — L97812 Non-pressure chronic ulcer of other part of right lower leg with fat layer exposed: Secondary | ICD-10-CM | POA: Diagnosis not present

## 2018-10-15 DIAGNOSIS — Z7901 Long term (current) use of anticoagulants: Secondary | ICD-10-CM | POA: Diagnosis not present

## 2018-10-15 DIAGNOSIS — Z86718 Personal history of other venous thrombosis and embolism: Secondary | ICD-10-CM | POA: Diagnosis not present

## 2018-10-15 DIAGNOSIS — M313 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: Secondary | ICD-10-CM | POA: Diagnosis not present

## 2018-10-15 DIAGNOSIS — I110 Hypertensive heart disease with heart failure: Secondary | ICD-10-CM | POA: Diagnosis not present

## 2018-10-15 DIAGNOSIS — I480 Paroxysmal atrial fibrillation: Secondary | ICD-10-CM | POA: Diagnosis not present

## 2018-10-15 DIAGNOSIS — M359 Systemic involvement of connective tissue, unspecified: Secondary | ICD-10-CM | POA: Diagnosis not present

## 2018-10-17 ENCOUNTER — Ambulatory Visit: Payer: Medicare Other | Admitting: Physician Assistant

## 2018-10-19 NOTE — Progress Notes (Signed)
LEILYNN, PILAT ([id number]) Visit Report for 10/15/2018 Arrival Information Details Patient Name: OTTO, CARAWAY. Date of Service: 10/15/2018 10:00 AM Medical Record Number: [id number] Patient Account Number: [id number] Date of Birth/Sex: 1950/11/15 (67 y.o. Female) Treating RN: Montey Hora Primary Care Janita Camberos: Crist Infante Other Clinician: Referring Athaliah Baumbach: Crist Infante Treating Claris Guymon/Extender: Melburn Hake, HOYT Weeks in Treatment: 4 Visit Information History Since Last Visit Added or deleted any medications: No Patient Arrived: Ambulatory Any new allergies or adverse reactions: No Arrival Time: 10:05 Had a fall or experienced change in No Accompanied By: self activities of daily living that may affect Transfer Assistance: None risk of falls: Patient Identification Verified: Yes Signs or symptoms of abuse/neglect since last visito No Secondary Verification Process Yes Hospitalized since last visit: No Completed: Implantable device outside of the clinic excluding No Patient Has Alerts: Yes cellular tissue based products placed in the center Patient Alerts: Patient on Blood since last visit: Thinner Has Dressing in Place as Prescribed: Yes Eliquis Pain Present Now: No Electronic Signature(s) Signed: 10/15/2018 3:26:36 PM By: Lorine Bears RCP, RRT, CHT Entered By: Lorine Bears on 10/15/2018 10:06:38 [name] ([id number]) -------------------------------------------------------------------------------- Encounter Discharge Information Details Patient Name: KINZIE, WICKES. Date of Service: 10/15/2018 10:00 AM Medical Record Number: [id number] Patient Account Number: [id number] Date of Birth/Sex: [birth year] (67 y.o. Female) Treating RN: Harold Barban Primary Care Kaysha Parsell: Crist Infante Other Clinician: Referring Merik Mignano: Crist Infante Treating Miel Wisener/Extender: Melburn Hake, HOYT Weeks in Treatment: 4 Encounter Discharge Information  Items Post Procedure Vitals Discharge Condition: Stable Temperature (F): 97.9 Ambulatory Status: Ambulatory Pulse (bpm): 78 Discharge Destination: Home Respiratory Rate (breaths/min): 16 Transportation: Private Auto Blood Pressure (mmHg): 140/71 Accompanied By: self Schedule Follow-up Appointment: Yes Clinical Summary of Care: Electronic Signature(s) Signed: 10/15/2018 11:13:25 AM By: Harold Barban Entered By: Harold Barban on 10/15/2018 11:01:57 [name] ([id number]) -------------------------------------------------------------------------------- Lower Extremity Assessment Details Patient Name: JELISSA, ESPIRITU. Date of Service: 10/15/2018 10:00 AM Medical Record Number: [id number] Patient Account Number: [id number] Date of Birth/Sex: July 29, 1951 (67 y.o. Female) Treating RN: Army Melia Primary Care Haivyn Oravec: Crist Infante Other Clinician: Referring Ashaunte Standley: Crist Infante Treating Ainsley Sanguinetti/Extender: Melburn Hake, HOYT Weeks in Treatment: 4 Edema Assessment Assessed: [Left: No] [Right: No] Edema: [Left: N] [Right: o] Vascular Assessment Pulses: Posterior Tibial Extremity colors, hair growth, and conditions: Extremity Color: [Right:Normal] Hair Growth on Extremity: [Right:Yes] Temperature of Extremity: [Right:Warm] Capillary Refill: [Right:< 3 seconds] Toe Nail Assessment Left: Right: Thick: Yes Discolored: No Deformed: No Improper Length and Hygiene: No Electronic Signature(s) Signed: 10/16/2018 9:17:40 AM By: Army Melia Entered By: Army Melia on 10/15/2018 10:19:22 [name] ([id number]) -------------------------------------------------------------------------------- Multi Wound Chart Details Patient Name: [name]. Date of Service: 10/15/2018 10:00 AM Medical Record Number: [id number] Patient Account Number: [id number] Date of Birth/Sex: Sep 21, 1950 (67 y.o. 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] [N/A:N/A] Comorbid History: [1:Arrhythmia, Deep Vein Thrombosis, Hypertension, Osteoarthritis, Received Chemotherapy] [N/A:N/A] Date Acquired: [1:08/01/2018] [N/A:N/A] Weeks of Treatment: [1:4] [N/A:N/A] Wound Status: [1:Open] [N/A:N/A] Measurements L x W x D [1:2.4x2.1x0.2] [N/A:N/A] (cm) Area (cm) : [1:3.958] [N/A:N/A] Volume (cm) : [1:0.792] [N/A:N/A] % Reduction in Area: [1:8.50%] [N/A:N/A] % Reduction in Volume: [  1:63.40%] [N/A:N/A] Classification: [1:Full Thickness Without Exposed Support Structures] [N/A:N/A] Exudate Amount: [1:Medium] [N/A:N/A] Exudate Type: [1:Purulent] [N/A:N/A] Exudate Color: [1:yellow, brown, green] [N/A:N/A] Wound Margin: [1:Flat and Intact] [N/A:N/A] Granulation Amount: [1:None Present (0%)] [N/A:N/A] Necrotic Amount: [1:Large (67-100%)] [N/A:N/A] Necrotic Tissue: [1:Eschar, Adherent Slough] [N/A:N/A] Exposed Structures: [1:Fat Layer (Subcutaneous Tissue) Exposed: Yes Fascia: No Tendon: No Muscle: No Joint: No Bone: No] [N/A:N/A] Epithelialization: [1:Small (1-33%)] [N/A:N/A] Periwound Skin Texture: [1:Excoriation: No Induration: No Callus: No] [N/A:N/A] Crepitus: No Rash: No Scarring: No Periwound Skin Moisture: Maceration: No N/A N/A Dry/Scaly: No Periwound Skin Color: Atrophie Blanche: No N/A N/A Cyanosis: No Ecchymosis: No Erythema: No Hemosiderin Staining: No Mottled: No Pallor: No Rubor: No Temperature: No Abnormality N/A N/A Tenderness on Palpation: Yes N/A N/A Wound Preparation: Ulcer  Cleansing: N/A N/A Rinsed/Irrigated with Saline, Other: soap and water Topical Anesthetic Applied: Other: lidocaine 4% Treatment Notes Electronic Signature(s) Signed: 10/15/2018 5:30:34 PM By: Montey Hora Entered By: Montey Hora on 10/15/2018 10:33:39 [name] ([id number]) -------------------------------------------------------------------------------- [place] Details Patient Name: ARWILDA, GEORGIA. Date of Service: 10/15/2018 10:00 AM Medical Record Number: [id number] Patient Account Number: [id number] Date of Birth/Sex: Jul 22, 1951 (67 y.o. Female) Treating RN: Montey Hora Primary Care Graceanne Guin: Crist Infante Other Clinician: Referring Sharma Lawrance: Crist Infante Treating Sena Hoopingarner/Extender: Melburn Hake, HOYT Weeks in Treatment: 4 Active Inactive Necrotic Tissue Nursing Diagnoses: Impaired tissue integrity related to necrotic/devitalized tissue Knowledge deficit related to management of necrotic/devitalized tissue Goals: Necrotic/devitalized tissue will be minimized in the wound bed Date Initiated: 09/12/2018 Target Resolution Date: 10/14/2018 Goal Status: Active Interventions: Assess patient pain level pre-, during and post procedure and prior to discharge Provide education on necrotic tissue and debridement process Treatment Activities: Apply topical anesthetic as ordered : 09/12/2018 Notes: Orientation to the Wound Care Program Nursing Diagnoses: Knowledge deficit related to the wound healing center program Goals: Patient/caregiver will verbalize understanding of the [place] Date Initiated: 09/12/2018 Target Resolution Date: 10/14/2018 Goal Status: Active Interventions: Provide education on orientation to the wound center Notes: Pain, Acute or Chronic Nursing Diagnoses: Pain Management - Non-cyclic Acute (Procedural) Goals: Patient will verbalize adequate pain control and receive pain control interventions during procedures  as needed KIONA, BLUME ([id number]) Date Initiated: 09/12/2018 Target Resolution Date: 10/14/2018 Goal Status: Active Interventions: Assess comfort goal upon admission Encourage patient to take pain medications as prescribed Treatment Activities: Administer pain control measures as ordered : 09/12/2018 Notes: Wound/Skin Impairment Nursing Diagnoses: Impaired tissue integrity Goals: Ulcer/skin breakdown will have a volume reduction of 30% by week 4 Date Initiated: 09/12/2018 Target Resolution Date: 10/14/2018 Goal Status: Active Interventions: Assess patient/caregiver ability to obtain necessary supplies Assess patient/caregiver ability to perform ulcer/skin care regimen upon admission and as needed Assess ulceration(s) every visit Treatment Activities: Referred to DME Roiza Wiedel for dressing supplies : 09/12/2018 Skin care regimen initiated : 09/12/2018 Notes: Electronic Signature(s) Signed: 10/15/2018 5:30:34 PM By: Montey Hora Entered By: Montey Hora on 10/15/2018 10:33:30 [name] ([id number]) -------------------------------------------------------------------------------- Pain Assessment Details Patient Name: TAKEYLA, MILLION. Date of Service: 10/15/2018 10:00 AM Medical Record Number: [id number] Patient Account Number: [id number] Date of Birth/Sex: [birth year] (67 y.o. Female) Treating RN: Montey Hora Primary Care Aprel Egelhoff: Crist Infante Other Clinician: Referring Zylie Mumaw: Crist Infante Treating Natiya Seelinger/Extender: Melburn Hake, HOYT Weeks in Treatment: 4 Active Problems Location of Pain Severity and Description of Pain Patient Has Paino No Site Locations Pain Management and Medication Current Pain Management: Electronic Signature(s) Signed: 10/15/2018 3:26:36 PM By: Lorine Bears RCP, RRT, CHT Signed: 10/15/2018  5:30:34 PM By: Montey Hora Entered By: Lorine Bears on 10/15/2018 10:06:47 [name]  ([id number]) -------------------------------------------------------------------------------- Patient/Caregiver Education Details Patient Name: YASEMIN, RABON Date of Service: 10/15/2018 10:00 AM Medical Record Number: [id number] Patient Account Number: [id number] Date of Birth/Gender: 1951/04/18 (67 y.o. Female) Treating RN: Montey Hora Primary Care Physician: Crist Infante Other Clinician: Referring Physician: Crist Infante Treating Physician/Extender: Sharalyn Ink in Treatment: 4 Education Assessment Education Provided To: Patient Education Topics Provided Venous: Handouts: Other: CONTINUE LEG ELEVATION Methods: Explain/Verbal Responses: State content correctly Electronic Signature(s) Signed: 10/15/2018 11:13:25 AM By: Harold Barban Entered By: Harold Barban on 10/15/2018 11:02:01 [name] ([id number]) -------------------------------------------------------------------------------- Wound Assessment Details Patient Name: DAMEKA, YOUNKER. Date of Service: 10/15/2018 10:00 AM Medical Record Number: [id number] Patient Account Number: [id number] Date of Birth/Sex: 11-May-1951 (67 y.o. Female) Treating RN: Army Melia Primary Care Nalani Andreen: Crist Infante Other Clinician: Referring Antwonette Feliz: Crist Infante Treating Renzo Vincelette/Extender: Melburn Hake, HOYT Weeks in Treatment: 4 Wound Status Wound Number: 1 Primary Auto-immune Etiology: Wound Location: Right Lower Leg - Lateral Wound Open Wounding Event: Bump Status: Date Acquired: 08/01/2018 Comorbid Arrhythmia, Deep Vein Thrombosis, Weeks Of Treatment: 4 History: Hypertension, Osteoarthritis, Received Clustered Wound: No Chemotherapy Photos Photo Uploaded By: Army Melia on 10/15/2018 11:43:35 Wound Measurements Length: (cm) 2.4 Width: (cm) 2.1 Depth: (cm) 0.2 Area: (cm) 3.958 Volume: (cm) 0.792 % Reduction in Area: 8.5% % Reduction in Volume: 63.4% Epithelialization: Small (1-33%) Tunneling:  No Undermining: No Wound Description Full Thickness Without Exposed Support Foul Odo Classification: Structures Slough/F Wound Margin: Flat and Intact Exudate Medium Amount: Exudate Type: Purulent Exudate Color: yellow, brown, green r After Cleansing: No ibrino Yes Wound Bed Granulation Amount: None Present (0%) Exposed Structure Necrotic Amount: Large (67-100%) Fascia Exposed: No Necrotic Quality: Eschar, Adherent Slough Fat Layer (Subcutaneous Tissue) Exposed: Yes Tendon Exposed: No Muscle Exposed: No Joint Exposed: No Bone Exposed: No Nicholl, [name] ([id number]) Periwound Skin Texture Texture Color No Abnormalities Noted: No No Abnormalities Noted: No Callus: No Atrophie Blanche: No Crepitus: No Cyanosis: No Excoriation: No Ecchymosis: No Induration: No Erythema: No Rash: No Hemosiderin Staining: No Scarring: No Mottled: No Pallor: No Moisture Rubor: No No Abnormalities Noted: No Dry / Scaly: No Temperature / Pain Maceration: No Temperature: No Abnormality Tenderness on Palpation: Yes Wound Preparation Ulcer Cleansing: Rinsed/Irrigated with Saline, Other: soap and water, Topical Anesthetic Applied: Other: lidocaine 4%, Treatment Notes Wound #1 (Right, Lateral Lower Leg) Notes Epifix still intact applied gauze, carboflex and foam for padding around ankle and achilles. 3 LAYER TO RIGHT Electronic Signature(s) Signed: 10/16/2018 9:17:40 AM By: Army Melia Entered By: Army Melia on 10/15/2018 10:18:57 [name] ([id number]) -------------------------------------------------------------------------------- Vitals Details Patient Name: [name] Date of Service: 10/15/2018 10:00 AM Medical Record Number: [id number] Patient Account Number: [id number] Date of Birth/Sex: 1951-02-16 (67 y.o. Female) Treating RN: Montey Hora Primary Care Heavenleigh Petruzzi: Crist Infante Other Clinician: Referring Eleonor Ocon: Crist Infante Treating Imelda Dandridge/Extender: Melburn Hake, HOYT Weeks in Treatment: 4 Vital Signs Time Taken: 10:06 Temperature (F): 97.9 Height (in): 63 Pulse (bpm): 78 Weight (lbs): 174 Respiratory Rate (breaths/min): 16 Body Mass Index (BMI): 30.8 Blood Pressure (mmHg): 141/71 Reference Range: 80 - 120 mg / dl Electronic Signature(s) Signed: 10/15/2018 3:26:36 PM By: Lorine Bears RCP, RRT, CHT Entered By: Lorine Bears on 10/15/2018 10:09:32

## 2018-10-19 NOTE — Progress Notes (Signed)
[name] ([id number]) Visit Report for 10/15/2018 Chief Complaint Document Details Patient Name: [name], [name]. Date of Service: 10/15/2018 10:00 AM Medical Record Number: [id number] Patient Account Number: [id number] Date of Birth/Sex: November 24, 1950 (67 y.o. Female) Treating RN: Montey Hora Primary Care Provider: Crist Infante Other Clinician: Referring Provider: Crist Infante Treating Provider/Extender: Melburn Hake, HOYT Weeks in Treatment: 4 Information Obtained from: Patient Chief Complaint Right LE Ulcer Electronic Signature(s) Signed: 10/17/2018 5:15:29 PM By: Worthy Keeler PA-C Entered By: Worthy Keeler on 10/15/2018 10:13:27 [name] ([id number]) -------------------------------------------------------------------------------- Cellular or Tissue Based Product Details Patient Name: [name], [name]. Date of Service: 10/15/2018 10:00 AM Medical Record Number: [id number] Patient Account Number: [id number] Date of Birth/Sex: 03-30-1951 (67 y.o. Female) Treating RN: Montey Hora Primary Care Provider: Crist Infante Other Clinician: Referring Provider: Crist Infante Treating Provider/Extender: Melburn Hake, HOYT Weeks in Treatment: 4 Cellular or Tissue Based Wound #1 Right,Lateral Lower Leg Product Type Applied to: Performed By: Physician STONE III, HOYT E., PA-C Cellular or Tissue Based Epifix Product Type: Level of Consciousness (Pre- Awake and Alert procedure): Pre-procedure Verification/Time Yes - 10:44 Out Taken: Location: trunk / arms / legs Wound Size (sq cm): 5.04 Product Size (sq cm): 8 Waste Size (sq cm): 0 Amount of Product Applied (sq cm): 8 Instrument Used: Forceps, Scissors Lot #: [id number] Expiration Date: 05/22/2023 Fenestrated: No Reconstituted: Yes Solution Type: normal saline Solution Amount: 5ML Lot #: [id number] Solution Expiration Date: 07/21/2020 Secured: Yes Secured With: Steri-Strips Dressing Applied: Yes Primary Dressing: mepitel  one Procedural Pain: 0 Post Procedural Pain: 0 Response to Treatment: Procedure was tolerated well Level of Consciousness (Post- Awake and Alert procedure): Post Procedure Diagnosis Same as Pre-procedure Electronic Signature(s) Signed: 10/15/2018 5:30:34 PM By: Montey Hora Entered By: Montey Hora on 10/15/2018 10:45:06 [name] ([id number]) -------------------------------------------------------------------------------- Debridement Details Patient Name: [name]. Date of Service: 10/15/2018 10:00 AM Medical Record Number: [id number] Patient Account Number: [id number] Date of Birth/Sex: Jul 29, 1951 (67 y.o. Female) Treating RN: Montey Hora Primary Care Provider: Crist Infante Other Clinician: Referring Provider: Crist Infante Treating Provider/Extender: Melburn Hake, HOYT Weeks in Treatment: 4 Debridement Performed for Wound #1 Right,Lateral Lower Leg Assessment: Performed By: Physician STONE III, HOYT E., PA-C Debridement Type: Debridement Level of Consciousness (Pre- Awake and Alert procedure): Pre-procedure Verification/Time Yes - 10:35 Out Taken: Start Time: 10:35 Pain Control: Lidocaine 4% Topical Solution Total Area Debrided ([name] x W): 2.4 (cm) x 2.1 (cm) = 5.04 (cm) Tissue and other material Viable, Non-Viable, Slough, Subcutaneous, Slough debrided: Level: Skin/Subcutaneous Tissue Debridement Description: Excisional Instrument: Curette Bleeding: Minimum Hemostasis Achieved: Pressure End Time: 10:38 Procedural Pain: 0 Post Procedural Pain: 0 Response to Treatment: Procedure was tolerated well Level of Consciousness Awake and Alert (Post-procedure): Post Debridement Measurements of Total Wound Length: (cm) 2.4 Width: (cm) 2.1 Depth: (cm) 0.4 Volume: (cm) 1.583 Character of Wound/Ulcer Post Debridement: Improved Post Procedure Diagnosis Same as Pre-procedure Electronic Signature(s) Signed: 10/15/2018 5:30:34 PM By: Montey Hora Signed:  10/17/2018 5:15:29 PM By: Worthy Keeler PA-C Entered By: Montey Hora on 10/15/2018 10:39:26 [name] ([id number]) -------------------------------------------------------------------------------- HPI Details Patient Name: [name]. Date of Service: 10/15/2018 10:00 AM Medical Record Number: [id number] Patient Account Number: [id number] Date of Birth/Sex: 10-08-1950 (67 y.o. 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 She states that she often has areas like this that will come up but normally not to the severity. She's typically able to take care of these herself. However she's been having a lot of trouble since this past summer in particular. 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 She was most recently treated with this by her primary care provider. Fortunately that seems to have cleared up any infection that she may have had. Nonetheless other pertinent medical history includes the chronic long-term use of Eliquis secondary to atrial fibrillation. Her ABI appear to be okay today. She did have a DVT 13 years ago and this lag but has not had anything recently. The Eliquis seems to be beneficial in this regard. Otherwise there's no evidence of active infection at this time. No fevers, chills, nausea, or vomiting noted at this time. 09/19/18 on evaluation today patient presents for follow-up concerning her ongoing lower extremity wound. This is her second visit here in our clinic. She does have slough covering the surface of the wound today. Fortunately there is no signs  of infection at this time. Overall I have been pleased with the interval change although she still has a lot of Slough I do feel like the Iodoflex was of benefit for her. We did have to change her wrap once in the weeks since I last saw her fortunately after that changed nothing seem to smell or drain through. 09/27/18 on evaluation today patient appears to be doing better in regard to her right lateral lower extremity ulcer. She's been tolerating the dressing changes without complication. Fortunately there is no sign of infection at this time. With that being said we have had to change the wrap at least once in the interim between when we see her and when she comes back in for evaluation due to the amount of drainage an odor. With that being said she is getting very frustrated with the length of time is taken get this to heal. Fortunately there is no evidence of anything worsening and a lot of the slough is improving she has good granulation seems to be peeking out from underneath. She is still having discomfort unfortunately. 10/03/18 on evaluation today patient's wound actually appears to be doing better from the standpoint of feeling in. With that being said she actually does have some slough covering the surface of the wound unfortunately. This is going to require sharp debridement prior to application of EpiFix. 10/15/18 on evaluation today patient actually appears to be doing much better in regard to her wound. I do believe that the EpiFix has been of benefit for her which is excellent news. Overall I think that this is something I would definitely recommend continuing I've seen definite progress. Electronic Signature(s) Signed: 10/17/2018 5:15:29 PM By: Worthy Keeler PA-C Entered By: Worthy Keeler on 10/16/2018 09:42:52 [name] ([id number]) -------------------------------------------------------------------------------- Physical Exam Details Patient Name: [name], [name]. Date of  Service: 10/15/2018 10:00 AM Medical Record Number: [id number] Patient Account Number: [id number] Date of Birth/Sex: [birth year] (67 y.o. Female) Treating RN: Montey Hora Primary Care Provider: Crist Infante Other Clinician: Referring Provider: Crist Infante Treating Provider/Extender: STONE III, HOYT Weeks in Treatment: 4 Constitutional Well-nourished and well-hydrated in no acute distress. Respiratory normal breathing without difficulty. clear to auscultation bilaterally. Cardiovascular regular rate and rhythm with normal S1, S2. Psychiatric this patient is  able to make decisions and demonstrates good insight into disease process. Alert and Oriented x 3. pleasant and cooperative. Electronic Signature(s) Signed: 10/17/2018 5:15:29 PM By: Worthy Keeler PA-C Entered By: Worthy Keeler on 10/16/2018 09:44:25 [place], [name] ([id number]) -------------------------------------------------------------------------------- Physician Orders Details Patient Name: [name], [name]. Date of Service: 10/15/2018 10:00 AM Medical Record Number: [id number] Patient Account Number: [id number] Date of Birth/Sex: Apr 23, 1951 (67 y.o. 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 o May shower with protection. - Please do not get your wrap wet Anesthetic (add to Medication List) Wound #1 Right,Lateral Lower Leg o Topical Lidocaine 4%  cream applied to wound bed prior to debridement (In Clinic Only). o Benzocaine Topical Anesthetic Spray applied to wound bed prior to debridement (In Clinic Only). Skin Barriers/Peri-Wound Care o Triamcinolone Acetonide Ointment (TCA) - to right lower leg Primary Wound Dressing Wound #1 Right,Lateral Lower Leg o Other: - Epifix Secondary Dressing Wound #1 Right,Lateral Lower Leg o ABD pad o Other - carboflex Dressing Change Frequency Wound #1 Right,Lateral Lower Leg o Change dressing every week o Other: - if needed Follow-up Appointments Wound #1 Right,Lateral Lower Leg o Return Appointment in 1 week. Edema Control Wound #1 Right,Lateral Lower Leg [name], [name] ([id number]) o 3 Layer Compression System - Right Lower Extremity - unna to anchor - use Kerlix rather than cotton layer o Elevate legs to the level of the heart and pump ankles as often as possible Advanced Therapies Wound #1 Right,Lateral Lower Leg o EpiFix application in clinic; including contact layer, fixation with steri strips, dry gauze and cover dressing. Electronic Signature(s) Signed: 10/15/2018 5:30:34 PM By: Montey Hora Signed: 10/17/2018 5:15:29 PM By: Worthy Keeler PA-C Entered By: Montey Hora on 10/15/2018 10:35:07 [name] ([id number]) -------------------------------------------------------------------------------- Problem List Details Patient Name: [name], [name]. Date of Service: 10/15/2018 10:00 AM Medical Record Number: [id number] Patient Account Number: [id number] Date of Birth/Sex: 08-Jan-1951 (67 y.o. 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 09/12/2018 No Yes L97.812  Non-pressure chronic ulcer of other part of right lower leg 09/12/2018 No Yes with fat layer exposed I48.0 Paroxysmal atrial fibrillation 09/12/2018 No Yes I10 Essential (primary) hypertension 09/12/2018 No Yes Z79.01 Long term (current) use of anticoagulants 09/12/2018 No Yes Inactive Problems Resolved Problems Electronic Signature(s) Signed: 10/17/2018 5:15:29 PM By: Worthy Keeler PA-C Entered By: Worthy Keeler on 10/15/2018 10:13:21 [name] ([id number]) -------------------------------------------------------------------------------- Progress Note Details Patient Name: [name] Date of Service: 10/15/2018 10:00 AM Medical Record Number: [id number] Patient Account Number: [id number] Date of Birth/Sex: 1950/11/13 (67 y.o. 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 She states that she often has areas like this that will come up but normally not to the severity. She's typically able to take care of these herself. However she's been having a lot of trouble since this past summer in particular. 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 She was most recently treated with this by her primary care provider. Fortunately that seems to have cleared up any infection that she may have had. Nonetheless other pertinent medical history includes the chronic long-term use of Eliquis secondary to atrial fibrillation. Her ABI appear to be okay today. She did have a DVT 13 years ago and this lag but has not had  anything recently. The Eliquis seems to be beneficial in this regard. Otherwise there's no evidence of active infection at this time. No fevers, chills, nausea, or vomiting noted at this time. 09/19/18 on evaluation today patient presents for follow-up concerning her ongoing lower extremity wound. This is her second visit here in our clinic. She does have slough covering the surface of the wound today. Fortunately there is no signs of infection at this time. Overall I have been pleased with the interval change although she still has a lot of Slough I do feel like the Iodoflex was of benefit for her. We did have to change her wrap once in the weeks since I last saw her fortunately after that changed nothing seem to smell or drain through. 09/27/18 on evaluation today patient appears to be doing better in regard to her right lateral lower extremity ulcer. She's been tolerating the dressing changes without complication. Fortunately there is no sign of infection at this time. With that being said we have had to change the wrap at least once in the interim between when we see her and when she comes back in for evaluation due to the amount of drainage an odor. With that being said she is getting very frustrated with the length of time is taken get this to heal. Fortunately there is no evidence of anything worsening and a lot of the slough is improving she has good granulation seems to be peeking out from underneath. She is still having discomfort unfortunately. 10/03/18 on evaluation today patient's wound actually appears to be doing better from the standpoint of feeling in. With that being said she actually does have some slough covering the surface of the wound unfortunately. This is going to require sharp debridement prior to application of EpiFix. 10/15/18 on evaluation today patient actually appears to be doing much better in regard to her wound. I do believe that the EpiFix has been of benefit for her  which is excellent news. Overall I think that this is something I would definitely recommend continuing I've seen definite progress. Patient History Information obtained from Patient. Family History Cancer - Father, Diabetes - Maternal Grandparents, Lung Disease - Father, Stroke - Father, No family history of Heart Disease, Hereditary Spherocytosis, Hypertension, Kidney Disease, Seizures, Thyroid Problems, Tuberculosis. Social History SUEANN, BROWNLEY ([id number]) Never smoker, Marital Status - Married, Alcohol Use - Never, Drug Use - No History, Caffeine Use - Daily. Medical History Eyes Denies history of Cataracts, Glaucoma, Optic Neuritis Ear/Nose/Mouth/Throat Denies history of Chronic sinus problems/congestion, Middle ear problems Hematologic/Lymphatic Denies history of Anemia, Hemophilia, Human Immunodeficiency Virus, Sickle Cell Disease Respiratory Denies history of Aspiration, Asthma, Chronic Obstructive  Pulmonary Disease (COPD), Pneumothorax, Sleep Apnea, Tuberculosis Cardiovascular Patient has history of Arrhythmia - a fib, Deep Vein Thrombosis - 13 years ago, Hypertension Denies history of Angina, Congestive Heart Failure, Coronary Artery Disease, Hypotension, Myocardial Infarction, Peripheral Arterial Disease, Peripheral Venous Disease, Phlebitis, Vasculitis Gastrointestinal Denies history of Cirrhosis , Colitis, Crohn s, Hepatitis A, Hepatitis B, Hepatitis C Endocrine Denies history of Type I Diabetes, Type II Diabetes Genitourinary Denies history of End Stage Renal Disease Immunological Denies history of Lupus Erythematosus, Raynaud s, Scleroderma Integumentary (Skin) Denies history of History of Burn, History of pressure wounds Musculoskeletal Patient has history of Osteoarthritis Denies history of Gout, Rheumatoid Arthritis, Osteomyelitis Neurologic Denies history of Dementia, Neuropathy, Quadriplegia, Paraplegia, Seizure Disorder Oncologic Patient has history of  Received Chemotherapy - past for Wegeners Denies history of Received Radiation Psychiatric Denies history of Anorexia/bulimia, Confinement Anxiety Medical And Surgical History Notes Immunological Wegeners Musculoskeletal OP Review of Systems (ROS) Constitutional Symptoms (General Health) Denies complaints or symptoms of Fever, Chills. Respiratory The patient has no complaints or symptoms. Cardiovascular The patient has no complaints or symptoms. Psychiatric The patient has no complaints or symptoms. General Notes: Patient's wound bed was sharply debride it that way the necrotic material in preparation for reapplication of the EpiFix. She tolerated this without complication this was secured with [employer] and Steri-Strips. [name], [name] ([id number]) Objective Constitutional Well-nourished and well-hydrated in no acute distress. Vitals Time Taken: 10:06 AM, Height: 63 in, Weight: 174 lbs, BMI: 30.8, Temperature: 97.9 F, Pulse: 78 bpm, Respiratory Rate: 16 breaths/min, Blood Pressure: 141/71 mmHg. Respiratory normal breathing without difficulty. clear to auscultation bilaterally. Cardiovascular regular rate and rhythm with normal S1, S2. Psychiatric this patient is able to make decisions and demonstrates good insight into disease process. Alert and Oriented x 3. pleasant and cooperative. Integumentary (Hair, Skin) Wound #1 status is Open. Original cause of wound was Bump. The wound is located on the Right,Lateral Lower Leg. The wound measures 2.4cm length x 2.1cm width x 0.2cm depth; 3.958cm^2 area and 0.792cm^3 volume. There is Fat Layer (Subcutaneous Tissue) Exposed exposed. There is no tunneling or undermining noted. There is a medium amount of purulent drainage noted. The wound margin is flat and intact. There is no granulation within the wound bed. There is a large (67-100%) amount of necrotic tissue within the wound bed including Eschar and Adherent Slough. The periwound  skin appearance did not exhibit: Callus, Crepitus, Excoriation, Induration, Rash, Scarring, Dry/Scaly, Maceration, Atrophie Blanche, Cyanosis, Ecchymosis, Hemosiderin Staining, Mottled, Pallor, Rubor, Erythema. Periwound temperature was noted as No Abnormality. The periwound has tenderness on palpation. 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 There was a Excisional Skin/Subcutaneous Tissue Debridement with a total area of 5.04 sq cm performed by STONE III, HOYT E., PA-C. With the [name], [name] ([id number]) following instrument(s): Curette to remove Viable and Non-Viable tissue/material. Material removed includes Subcutaneous Tissue and Slough and after achieving pain control using Lidocaine 4% Topical Solution. No specimens were taken. A time out was conducted at 10:35, prior to the start of the procedure. A Minimum amount of bleeding was controlled with Pressure. The procedure was tolerated well with a pain level of 0 throughout and a pain level of 0 following the procedure. Post Debridement  Measurements: 2.4cm length x 2.1cm width x 0.4cm depth; 1.583cm^3 volume. Character of Wound/Ulcer Post Debridement is improved. 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 A skin graft procedure using a bioengineered skin substitute/cellular or tissue based product was performed by STONE III, HOYT E., PA-C with the following instrument(s): Forceps and Scissors. Epifix was applied and secured with Steri-Strips. 8 sq cm of product was utilized and 0 sq  cm was wasted. Post Application, mepitel one was applied. A Time Out was conducted at 10:44, prior to the start of the procedure. The procedure was tolerated well with a pain level of 0 throughout and a pain level of 0 following the procedure. Post procedure Diagnosis Wound #1: Same as Pre-Procedure . Plan Wound Cleansing: Wound #1 Right,Lateral Lower Leg: Clean wound with Normal Saline. May shower with protection. - Please do not get your wrap wet Anesthetic (add to Medication List): Wound #1 Right,Lateral Lower Leg: Topical Lidocaine 4% cream applied to wound bed prior to debridement (In Clinic Only). Benzocaine Topical Anesthetic Spray applied to wound bed prior to debridement (In Clinic Only). Skin Barriers/Peri-Wound Care: Triamcinolone Acetonide Ointment (TCA) - to right lower leg Primary Wound Dressing: Wound #1 Right,Lateral Lower Leg: Other: - Epifix Secondary Dressing: Wound #1 Right,Lateral Lower Leg: ABD pad Other - carboflex Dressing Change Frequency: Wound #1 Right,Lateral Lower Leg: Change dressing every week Other: - if needed Follow-up Appointments: Wound #1 Right,Lateral Lower Leg: Return Appointment in 1 week. Edema Control: Wound #1 Right,Lateral Lower Leg: 3 Layer Compression System - Right Lower Extremity - unna to anchor - use Kerlix rather than cotton layer Elevate legs to the level of the heart and pump ankles as often as possible Advanced Therapies: Wound #1 Right,Lateral Lower Leg: EpiFix application in clinic; including contact layer, fixation with steri strips, dry gauze and cover dressing. [name], [name] ([id number]) Yetta Flock suggest that we continue with the above wound care measures since she seems to be doing so well. Already I'm seeing dramatic improvement just after one application of the EpiFix. We will continue as such. If anything changes or worsens meantime patient will contact the office and let me know. Please see above for specific  wound care orders. We will see patient for re-evaluation in 1 week(s) here in the clinic. If anything worsens or changes patient will contact our office for additional recommendations. Electronic Signature(s) Signed: 10/17/2018 5:15:29 PM By: Worthy Keeler PA-C Entered By: Worthy Keeler on 10/16/2018 09:44:35 [place], [name] ([id number]) -------------------------------------------------------------------------------- ROS/PFSH Details Patient Name: [name], [name]. Date of Service: 10/15/2018 10:00 AM Medical Record Number: [id number] Patient Account Number: [id number] Date of Birth/Sex: 1950/11/12 (67 y.o. Female) Treating RN: Montey Hora Primary Care Provider: Crist Infante Other Clinician: Referring Provider: Crist Infante Treating Provider/Extender: Melburn Hake, HOYT Weeks in Treatment: 4 Information Obtained From Patient Wound History Do you currently have one or more open woundso Yes How many open wounds do you currently haveo 1 Approximately how long have you had your woundso 6 weeks How have you been treating your wound(s) until nowo unna boots Has your wound(s) ever healed and then re-openedo No Have you had any lab work done in the past montho No Have you tested positive for an antibiotic resistant organism (MRSA, VRE)o No Have you tested positive for osteomyelitis (bone infection)o No Have you had any tests for circulation on your legso Yes Who ordered the testo [facility]  Where was the test doneo 2015 Constitutional Symptoms (General Health) Complaints and Symptoms: Negative for: Fever; Chills Eyes Medical History: Negative for: Cataracts; Glaucoma; Optic Neuritis Ear/Nose/Mouth/Throat Medical History: Negative for: Chronic sinus problems/congestion; Middle ear problems Hematologic/Lymphatic Medical History: Negative for: Anemia; Hemophilia; Human Immunodeficiency Virus; Sickle Cell Disease Respiratory Complaints and Symptoms: No Complaints or Symptoms Medical  History: Negative for: Aspiration; Asthma; Chronic Obstructive Pulmonary Disease (COPD); Pneumothorax; Sleep Apnea; Tuberculosis Cardiovascular Complaints and Symptoms: No Complaints or Symptoms [name], [name] ([id number]) Medical History: Positive for: Arrhythmia - a fib; Deep Vein Thrombosis - 13 years ago; Hypertension Negative for: Angina; Congestive Heart Failure; Coronary Artery Disease; Hypotension; Myocardial Infarction; Peripheral Arterial Disease; Peripheral Venous Disease; Phlebitis; Vasculitis Gastrointestinal Medical History: Negative for: Cirrhosis ; Colitis; Crohnos; Hepatitis A; Hepatitis B; Hepatitis C Endocrine Medical History: Negative for: Type I Diabetes; Type II Diabetes Genitourinary Medical History: Negative for: End Stage Renal Disease Immunological Medical History: Negative for: Lupus Erythematosus; Raynaudos; Scleroderma Past Medical History Notes: [employer] Integument[facility] (Skin) Medical History: Negative for: History of Burn; History of pressure wounds Musculoskeletal Medical History: Positive for: Osteoarthritis Negative for: Gout; Rheumatoid Arthritis; Osteomyelitis Past Medical History Notes: OP Neurologic Medical History: Negative for: Dementia; Neuropathy; Quadriplegia; Paraplegia; Seizure Disorder Oncologic Medical History: Positive for: Received Chemotherapy - past for [employer] Negative for: Received Radiation Psychiatric Complaints and Symptoms: No Complaints or Symptoms Medical History: Negative for: Anorexia/bulimia; Confinement Anxiety LYNELL, [employer]EN[employer] ([id number]) Immunizations Pneumococcal Vaccine: Received Pneumococcal Vaccination: Yes Implantable Devices No devices added Family and Social History Cancer: Yes - Father; Diabetes: Yes - Maternal Grandparents; Heart Disease: No; Hereditary Spherocytosis: No; Hypertension: No; Kidney Disease: No; Lung Disease: Yes - Father; Seizures: No; Stroke: Yes - Father; Thyroid  Problems: No; Tuberculosis: No; Never smoker; Marital Status - Married; Alcohol Use: Never; Drug Use: No History; Caffeine Use: Daily; Financial Concerns: No; Food, Clothing or Shelter Needs: No; Support System Lacking: No; Transportation Concerns: No; Advanced Directives: Yes (Not Provided); Patient does not want information on Advanced Directives; Medical Power of Attorney: Yes - sister - Sadako Cegielski (Not Provided) Notes Patient's wound bed was sharply debride it that way the necrotic material in preparation for reapplication of the EpiFix. She tolerated this without complication this was secured with [employer] and Steri-Strips. Electronic Signature(s) Signed: 10/16/2018 4:40:34 PM By: Montey Hora Signed: 10/17/2018 5:15:29 PM By: Worthy Keeler PA-C Entered By: Worthy Keeler on 10/16/2018 09:43:35 Hett, [name] ([id number]) -------------------------------------------------------------------------------- SuperBill Details Patient Name: IFEOLUWA, BARTZ. Date of Service: 10/15/2018 Medical Record Number: [id number] Patient Account Number: [id number] Date of Birth/Sex: February 15, 1951 (68 y.o. 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 I48.0 Paroxysmal atrial fibrillation I10 Essential (primary) hypertension Z79.01 Long term (current) use of anticoagulants Facility Procedures CPT4 Code Description: [id number] o Epifix o 4 x 4 Sq Cm Modifier: Quantity: 8 CPT4 Code Description: [id number] [id number] - SKIN SUB GRAFT TRNK/ARM/LEG ICD-10 Diagnosis Description L97.812 Non-pressure chronic ulcer of other part of right lower leg wit Modifier: h fat layer  expo Quantity: 1 sed Physician Procedures CPT4 Code Description: [id number] - WC PHYS SKIN SUB GRAFT TRNK/ARM/LEG ICD-10 Diagnosis Description L97.812 Non-pressure chronic ulcer of other part of right lower leg with Modifier: fat layer expo Quantity: 1 sed  Electronic Signature(s) Signed: 10/17/2018 5:15:29 PM By: Worthy Keeler PA-C Entered By: Worthy Keeler on 10/16/2018 09:45:11

## 2018-10-22 ENCOUNTER — Encounter: Payer: Medicare Other | Attending: Physician Assistant | Admitting: Physician Assistant

## 2018-10-22 DIAGNOSIS — Z7901 Long term (current) use of anticoagulants: Secondary | ICD-10-CM | POA: Diagnosis not present

## 2018-10-22 DIAGNOSIS — Z91048 Other nonmedicinal substance allergy status: Secondary | ICD-10-CM | POA: Insufficient documentation

## 2018-10-22 DIAGNOSIS — Z8249 Family history of ischemic heart disease and other diseases of the circulatory system: Secondary | ICD-10-CM | POA: Insufficient documentation

## 2018-10-22 DIAGNOSIS — L97812 Non-pressure chronic ulcer of other part of right lower leg with fat layer exposed: Secondary | ICD-10-CM | POA: Insufficient documentation

## 2018-10-22 DIAGNOSIS — M313 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: Secondary | ICD-10-CM | POA: Insufficient documentation

## 2018-10-22 DIAGNOSIS — M1990 Unspecified osteoarthritis, unspecified site: Secondary | ICD-10-CM | POA: Insufficient documentation

## 2018-10-22 DIAGNOSIS — I10 Essential (primary) hypertension: Secondary | ICD-10-CM | POA: Diagnosis not present

## 2018-10-22 DIAGNOSIS — E11622 Type 2 diabetes mellitus with other skin ulcer: Secondary | ICD-10-CM | POA: Diagnosis not present

## 2018-10-22 DIAGNOSIS — I480 Paroxysmal atrial fibrillation: Secondary | ICD-10-CM | POA: Diagnosis not present

## 2018-10-22 DIAGNOSIS — Z86718 Personal history of other venous thrombosis and embolism: Secondary | ICD-10-CM | POA: Insufficient documentation

## 2018-10-22 DIAGNOSIS — M359 Systemic involvement of connective tissue, unspecified: Secondary | ICD-10-CM | POA: Diagnosis not present

## 2018-10-22 DIAGNOSIS — Z885 Allergy status to narcotic agent status: Secondary | ICD-10-CM | POA: Insufficient documentation

## 2018-10-25 NOTE — Progress Notes (Signed)
ALLORA, BAINS ([id number]) Visit Report for 10/22/2018 Chief Complaint Document Details Patient Name: [name], [name]. Date of Service: 10/22/2018 9:45 AM Medical Record Number: [id number] Patient Account Number: [id number] Date of Birth/Sex: 02/27/1951 (68 y.o. F) Treating RN: Montey Hora Primary Care Provider: Crist Infante Other Clinician: Referring Provider: Crist Infante Treating Provider/Extender: Melburn Hake, Fe Okubo Weeks in Treatment: 5 Information Obtained from: Patient Chief Complaint Right LE Ulcer Electronic Signature(s) Signed: 10/23/2018 11:58:16 PM By: Worthy Keeler PA-C Entered By: Worthy Keeler on 10/22/2018 10:01:33 [name] ([id number]) -------------------------------------------------------------------------------- Cellular or Tissue Based Product Details Patient Name: [name], [name]. Date of Service: 10/22/2018 9:45 AM Medical Record Number: [id number] Patient Account Number: [id number] Date of Birth/Sex: April 26, 1951 (68 y.o. F) Treating RN: Montey Hora Primary Care Provider: Crist Infante Other Clinician: Referring Provider: Crist Infante Treating Provider/Extender: Melburn Hake, Gianelle Mccaul Weeks in Treatment: 5 Cellular or Tissue Based Wound #1 Right,Lateral Lower Leg Product Type Applied to: Performed By: Physician STONE III, Aeson Sawyers E., PA-C Cellular or Tissue Based Epifix Product Type: Level of Consciousness (Pre- Awake and Alert procedure): Pre-procedure Verification/Time Yes - 10:40 Out Taken: Location: trunk / arms / legs Wound Size (sq cm): 4 Product Size (sq cm): 6 Waste Size (sq cm): 0 Amount of Product Applied (sq cm): 6 Instrument Used: Forceps, Scissors Lot #: [id number] Expiration Date: 07/22/2023 Fenestrated: No Reconstituted: Yes Solution Type: normal saline Solution Amount: 5 ML Lot #: [id number] Solution Expiration Date: 11/20/2019 Secured: Yes Secured With: Steri-Strips Dressing Applied: Yes Primary Dressing: adaptic Procedural Pain:  0 Post Procedural Pain: 0 Response to Treatment: Procedure was tolerated well Level of Consciousness (Post- Awake and Alert procedure): Post Procedure Diagnosis Same as Pre-procedure Electronic Signature(s) Signed: 10/22/2018 5:24:46 PM By: Montey Hora Entered By: Montey Hora on 10/22/2018 10:42:53 [name] ([id number]) -------------------------------------------------------------------------------- Debridement Details Patient Name: [name]. Date of Service: 10/22/2018 9:45 AM Medical Record Number: [id number] Patient Account Number: [id number] Date of Birth/Sex: May 19, 1951 (68 y.o. F) Treating RN: Montey Hora Primary Care Provider: Crist Infante Other Clinician: Referring Provider: Crist Infante Treating Provider/Extender: Melburn Hake, Keyler Hoge Weeks in Treatment: 5 Debridement Performed for Wound #1 Right,Lateral Lower Leg Assessment: Performed By: Physician STONE III, Lashon Hillier E., PA-C Debridement Type: Debridement Level of Consciousness (Pre- Awake and Alert procedure): Pre-procedure Verification/Time Yes - 10:31 Out Taken: Start Time: 10:31 Pain Control: Lidocaine 4% Topical Solution Total Area Debrided (L x W): 2 (cm) x 2 (cm) = 4 (cm) Tissue and other material Viable, Non-Viable, Slough, Subcutaneous, Slough debrided: Level: Skin/Subcutaneous Tissue Debridement Description: Excisional Instrument: Curette Bleeding: Minimum Hemostasis Achieved: Pressure End Time: 10:36 Procedural Pain: 0 Post Procedural Pain: 0 Response to Treatment: Procedure was tolerated well Level of Consciousness Awake and Alert (Post-procedure): Post Debridement Measurements of Total Wound Length: (cm) 2 Width: (cm) 2 Depth: (cm) 0.4 Volume: (cm) 1.257 Character of Wound/Ulcer Post Debridement: Improved Post Procedure Diagnosis Same as Pre-procedure Electronic Signature(s) Signed: 10/22/2018 5:24:46 PM By: Montey Hora Signed: 10/23/2018 11:58:16 PM By: Worthy Keeler  PA-C Entered By: Montey Hora on 10/22/2018 10:36:17 [name] ([id number]) -------------------------------------------------------------------------------- HPI Details Patient Name: [name] Date of Service: 10/22/2018 9:45 AM Medical Record Number: [id number] Patient Account Number: [id number] Date of Birth/Sex: 06-17-1951 (69 y.o. 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 She states that she often has areas like this that will come up but normally not to the severity. She's typically able to take care of these herself. However she's been having a lot of trouble since this past summer in particular. 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 She was most recently treated with this by her primary care provider. Fortunately that seems to have cleared up any infection that she may have had. Nonetheless other pertinent medical history includes the chronic long-term use of Eliquis secondary to atrial fibrillation. Her ABI appear to be okay today. She did have a DVT 13 years ago and this lag but has not had anything recently. The Eliquis seems to be beneficial in this regard. Otherwise there's no evidence of active infection at this time. No fevers, chills, nausea, or vomiting noted at this time. 09/19/18 on evaluation today patient presents for follow-up concerning her ongoing lower extremity wound. This is her second visit here in our clinic. She does have slough covering the surface of the wound today. Fortunately there is no signs of infection at this time. Overall I have been  pleased with the interval change although she still has a lot of Slough I do feel like the Iodoflex was of benefit for her. We did have to change her wrap once in the weeks since I last saw her fortunately after that changed nothing seem to smell or drain through. 09/27/18 on evaluation today patient appears to be doing better in regard to her right lateral lower extremity ulcer. She's been tolerating the dressing changes without complication. Fortunately there is no sign of infection at this time. With that being said we have had to change the wrap at least once in the interim between when we see her and when she comes back in for evaluation due to the amount of drainage an odor. With that being said she is getting very frustrated with the length of time is taken get this to heal. Fortunately there is no evidence of anything worsening and a lot of the slough is improving she has good granulation seems to be peeking out from underneath. She is still having discomfort unfortunately. 10/03/18 on evaluation today patient's wound actually appears to be doing better from the standpoint of feeling in. With that being said she actually does have some slough covering the surface of the wound unfortunately. This is going to require sharp debridement prior to application of EpiFix. 10/15/18 on evaluation today patient actually appears to be doing much better in regard to her wound. I do believe that the EpiFix has been of benefit for her which is excellent news. Overall I think that this is something I would definitely recommend continuing I've seen definite progress. 10/22/18 on evaluation today patient actually appears to be doing very well in regard to her leg ulcer. This is shown signs of excellent improvement measurement wise she is continuing to week by week make great progress in my pinion. Overall very pleased in this regard. Fortunately there's no signs of infection at this time. I do believe that the  EpiFix has been beneficial for her. Electronic Signature(s) Signed: 10/23/2018 11:58:16 PM By: Worthy Keeler PA-C Entered By: Worthy Keeler on 10/22/2018 10:44:33 [name] ([id number]) -------------------------------------------------------------------------------- Physical Exam Details Patient Name: [name], [name]. Date of Service: 10/22/2018 9:45 AM Medical Record Number:  [id number] Patient Account Number: [id number] Date of Birth/Sex: [birth year] (68 y.o. F) Treating RN: Montey Hora Primary Care Provider: Crist Infante Other Clinician: Referring Provider: Crist Infante Treating Provider/Extender: STONE III, Donalee Gaumond Weeks in Treatment: 5 Constitutional Well-nourished and well-hydrated in no acute distress. Respiratory normal breathing without difficulty. Psychiatric this patient is able to make decisions and demonstrates good insight into disease process. Alert and Oriented x 3. pleasant and cooperative. Notes Patient's wound bed currently did require some light sharp debridement to clear way the necrotic tissue and slough from the surface of the wound in preparation for reapplying the EpiFix today. She tolerated all this without complication. Post debridement wound bed appears to be doing much better which is excellent news. I'm very pleased in this regard. Electronic Signature(s) Signed: 10/23/2018 11:58:16 PM By: Worthy Keeler PA-C Entered By: Worthy Keeler on 10/22/2018 10:45:00 [name] ([id number]) -------------------------------------------------------------------------------- Physician Orders Details Patient Name: [name], [name]. Date of Service: 10/22/2018 9:45 AM Medical Record Number: [id number] Patient Account Number: [id number] Date of Birth/Sex: 1950-09-17 (68 y.o. 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 o May shower with protection. - Please do not get your wrap wet Anesthetic (add to Medication List) Wound #1 Right,Lateral Lower Leg o Topical Lidocaine 4% cream applied to wound bed prior to debridement (In Clinic Only). o Benzocaine Topical Anesthetic Spray applied to wound bed prior to debridement (In Clinic Only). Skin Barriers/Peri-Wound Care o Triamcinolone Acetonide Ointment (TCA) - to right lower leg Primary Wound Dressing Wound #1 Right,Lateral Lower Leg o Other: - Epifix Secondary Dressing Wound #1 Right,Lateral Lower Leg o ABD pad o Other - carboflex Dressing Change Frequency Wound #1 Right,Lateral Lower Leg o Change dressing every week o Other: - if needed Follow-up Appointments Wound #1 Right,Lateral Lower Leg o Return Appointment in 1 week. Edema Control Wound #1 Right,Lateral Lower Leg HARLEEN, FINEBERG ([id number]) o 3 Layer Compression System - Right Lower Extremity - unna to anchor - use Kerlix rather than cotton layer o Elevate legs to the level of the heart and pump ankles as often as possible Advanced Therapies Wound #1 Right,Lateral Lower Leg o EpiFix application in clinic; including contact layer, fixation with steri strips, dry gauze and cover dressing. Electronic Signature(s) Signed: 10/22/2018 5:24:46 PM By: Montey Hora Signed: 10/23/2018 11:58:16 PM By: Worthy Keeler PA-C Entered By: Montey Hora on 10/22/2018 10:36:44 [name]  ([id number]) -------------------------------------------------------------------------------- Problem List Details Patient Name: [name], [name]. Date of Service: 10/22/2018 9:45 AM Medical Record Number: [id number] Patient Account Number: [id number] Date of Birth/Sex: 1951/02/24 (68 y.o. 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 09/12/2018 No Yes L97.812 Non-pressure chronic ulcer of other part of right lower leg 09/12/2018 No Yes with fat layer exposed I48.0 Paroxysmal atrial fibrillation 09/12/2018 No Yes I10 Essential (primary) hypertension 09/12/2018 No Yes Z79.01 Long  term (current) use of anticoagulants 09/12/2018 No Yes Inactive Problems Resolved Problems Electronic Signature(s) Signed: 10/23/2018 11:58:16 PM By: Worthy Keeler PA-C Entered By: Worthy Keeler on 10/22/2018 10:01:29 [name], [name] ([id number]) -------------------------------------------------------------------------------- Progress Note Details Patient Name: [name], [name]. Date of Service: 10/22/2018 9:45 AM Medical Record Number: [id number] Patient Account Number: [id number] Date of Birth/Sex: 12-17-1950 (68 y.o. 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 She states that she often has areas like this that will come up but normally not to the  severity. She's typically able to take care of these herself. However she's been having a lot of trouble since this past summer in particular. 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 She was most recently treated with this by her primary care provider. Fortunately that seems to have cleared up any infection that she may have had. Nonetheless other pertinent medical history includes the chronic long-term use of Eliquis secondary to atrial fibrillation. Her ABI appear to be okay today. She did have a DVT 13 years ago and this lag but has not had anything recently. The Eliquis seems to be beneficial in this regard. Otherwise there's no evidence of active infection at this time. No fevers, chills, nausea, or vomiting noted at this time. 09/19/18 on evaluation today patient presents for follow-up concerning her ongoing lower extremity wound. This is her second visit here in our clinic. She does have slough covering the surface of the wound today. Fortunately there is no signs of infection at this time. Overall I have been pleased with the interval change although she still has a lot of Slough I do feel like the Iodoflex was of benefit for her. We did have to change her wrap once in the weeks since I last saw her fortunately after that changed nothing seem to smell or drain through. 09/27/18 on evaluation today patient appears to be doing better in regard to her right lateral lower extremity ulcer. She's been tolerating the dressing changes without complication. Fortunately there is no sign of infection at this time. With that being said we have had to change the wrap at least once in the interim between when we see her and when she comes back in for evaluation due to the amount of drainage an odor. With that being said she is getting very frustrated with the length of time is taken get this to heal. Fortunately there is  no evidence of anything worsening and a lot of the slough is improving she has good granulation seems to be peeking out from underneath. She is still having discomfort unfortunately. 10/03/18 on evaluation today patient's wound actually appears to be doing better from the standpoint of feeling in. With that being said she actually does have some slough covering the surface of the wound unfortunately. This is going to require sharp debridement prior to application of EpiFix. 10/15/18 on evaluation today patient actually appears to be doing much better in regard to her wound. I do believe that the EpiFix has been of benefit for her which is excellent news. Overall I think that this is something  I would definitely recommend continuing I've seen definite progress. 10/22/18 on evaluation today patient actually appears to be doing very well in regard to her leg ulcer. This is shown signs of excellent improvement measurement wise she is continuing to week by week make great progress in my pinion. Overall very pleased in this regard. Fortunately there's no signs of infection at this time. I do believe that the EpiFix has been beneficial for her. Patient History Information obtained from Patient. Family History [name], [name] ([id number]) Cancer - Father, Diabetes - Maternal Grandparents, Lung Disease - Father, Stroke - Father, No family history of Heart Disease, Hereditary Spherocytosis, Hypertension, Kidney Disease, Seizures, Thyroid Problems, Tuberculosis. Social History Never smoker, Marital Status - Married, Alcohol Use - Never, Drug Use - No History, Caffeine Use - Daily. Medical History Eyes Denies history of Cataracts, Glaucoma, Optic Neuritis Ear/Nose/Mouth/Throat Denies history of Chronic sinus problems/congestion, Middle ear problems Hematologic/Lymphatic Denies history of Anemia, Hemophilia, Human Immunodeficiency Virus, Sickle Cell Disease Respiratory Denies history of Aspiration,  Asthma, Chronic Obstructive Pulmonary Disease (COPD), Pneumothorax, Sleep Apnea, Tuberculosis Cardiovascular Patient has history of Arrhythmia - a fib, Deep Vein Thrombosis - 13 years ago, Hypertension Denies history of Angina, Congestive Heart Failure, Coronary Artery Disease, Hypotension, Myocardial Infarction, Peripheral Arterial Disease, Peripheral Venous Disease, Phlebitis, Vasculitis Gastrointestinal Denies history of Cirrhosis , Colitis, Crohn s, Hepatitis A, Hepatitis B, Hepatitis C Endocrine Denies history of Type I Diabetes, Type II Diabetes Genitourinary Denies history of End Stage Renal Disease Immunological Denies history of Lupus Erythematosus, Raynaud s, Scleroderma Integumentary (Skin) Denies history of History of Burn, History of pressure wounds Musculoskeletal Patient has history of Osteoarthritis Denies history of Gout, Rheumatoid Arthritis, Osteomyelitis Neurologic Denies history of Dementia, Neuropathy, Quadriplegia, Paraplegia, Seizure Disorder Oncologic Patient has history of Received Chemotherapy - past for Wegeners Denies history of Received Radiation Psychiatric Denies history of Anorexia/bulimia, Confinement Anxiety Medical And Surgical History Notes Immunological Wegeners Musculoskeletal OP Review of Systems (ROS) Constitutional Symptoms (General Health) Denies complaints or symptoms of Fever, Chills. Respiratory The patient has no complaints or symptoms. Cardiovascular The patient has no complaints or symptoms. Psychiatric The patient has no complaints or symptoms. [name], [name] ([id number]) Objective Constitutional Well-nourished and well-hydrated in no acute distress. Vitals Time Taken: 10:02 AM, Height: 63 in, Weight: 174 lbs, BMI: 30.8, Temperature: 98.6 F, Pulse: 71 bpm, Respiratory Rate: 16 breaths/min, Blood Pressure: 126/72 mmHg. Respiratory normal breathing without difficulty. Psychiatric this patient is able to make decisions  and demonstrates good insight into disease process. Alert and Oriented x 3. pleasant and cooperative. General Notes: Patient's wound bed currently did require some light sharp debridement to clear way the necrotic tissue and slough from the surface of the wound in preparation for reapplying the EpiFix today. She tolerated all this without complication. Post debridement wound bed appears to be doing much better which is excellent news. I'm very pleased in this regard. Integumentary (Hair, Skin) Wound #1 status is Open. Original cause of wound was Bump. The wound is located on the Right,Lateral Lower Leg. The wound measures 2cm length x 2cm width x 0.2cm depth; 3.142cm^2 area and 0.628cm^3 volume. There is Fat Layer (Subcutaneous Tissue) Exposed exposed. There is no tunneling or undermining noted. There is a medium amount of purulent drainage noted. The wound margin is flat and intact. There is no granulation within the wound bed. There is a large (67-100%) amount of necrotic tissue within the wound bed including Eschar and Adherent Slough. The periwound skin appearance did  not exhibit: Callus, Crepitus, Excoriation, Induration, Rash, Scarring, Dry/Scaly, Maceration, Atrophie Blanche, Cyanosis, Ecchymosis, Hemosiderin Staining, Mottled, Pallor, Rubor, Erythema. Periwound temperature was noted as No Abnormality. The periwound has tenderness on palpation. 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 There was a Excisional Skin/Subcutaneous Tissue Debridement with a total area of 4 sq cm performed by STONE III, Issis Lindseth E., PA-C.  With the following instrument(s): Curette to remove Viable and Non-Viable tissue/material. Material removed includes Subcutaneous Tissue and Slough and after achieving pain control using Lidocaine 4% Topical Solution. No specimens were taken. A time out was conducted at 10:31, prior to the start of the procedure. A Minimum amount of bleeding was controlled with Pressure. The procedure was tolerated well with a pain level of 0 throughout and a pain level of 0 following the procedure. Post Debridement Measurements: 2cm length x 2cm width x 0.4cm depth; 1.257cm^3 volume. Character of Wound/Ulcer Post Debridement is improved. 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 A skin graft procedure using a bioengineered skin substitute/cellular or tissue based product was performed by STONE III, Lakie Mclouth E., PA-C with the following instrument(s): Forceps and Scissors. Epifix was applied and secured with Steri-Strips. 6 sq cm of product was utilized and 0 sq cm was wasted. Post Application, adaptic was applied. A Time Out was conducted at 10:40, prior to the start of the procedure. The procedure was tolerated well with a pain level of 0 throughout and a pain level of 0 following the procedure. Post procedure Diagnosis Wound #1: Same as Pre-Procedure . Plan Wound Cleansing: Wound #1 Right,Lateral Lower Leg: Clean wound with Normal Saline. May shower with protection. - Please do not get your wrap wet Anesthetic (add to Medication List): Wound #1 Right,Lateral Lower Leg: Topical Lidocaine 4% cream applied to wound bed prior to debridement (In Clinic Only). Benzocaine Topical Anesthetic Spray applied to wound bed prior to debridement (In Clinic Only). Skin Barriers/Peri-Wound Care: Triamcinolone Acetonide Ointment (TCA) - to right lower leg Primary Wound Dressing: Wound #1 Right,Lateral Lower  Leg: Other: - Epifix Secondary Dressing: Wound #1 Right,Lateral Lower Leg: ABD pad Other - carboflex Dressing Change Frequency: Wound #1 Right,Lateral Lower Leg: Change dressing every week Other: - if needed Follow-up Appointments: Wound #1 Right,Lateral Lower Leg: Return Appointment in 1 week. Edema Control: Wound #1 Right,Lateral Lower Leg: 3 Layer Compression System - Right Lower Extremity - unna to anchor - use Kerlix rather than cotton layer Elevate legs to the level of the heart and pump ankles as often as possible Advanced Therapies: Wound #1 Right,Lateral Lower Leg: EpiFix application in clinic; including contact layer, fixation with steri strips, dry gauze and cover dressing. JOVONNA, NICKELL ([id number]) I'm in a recommend that we continue with the above wound care measures for the next week. Patient is in agreement with plan. If anything changes worsens meantime she will contact the office and let me know. Please see above for specific wound care orders. We will see patient for re-evaluation in 1 week(s) here in the clinic. If anything worsens or changes patient will contact our office for additional recommendations. Electronic Signature(s) Signed: 10/23/2018 11:58:16 PM  By: Worthy Keeler PA-C Entered By: Worthy Keeler on 10/22/2018 10:45:17 [name] ([id number]) -------------------------------------------------------------------------------- ROS/PFSH Details Patient Name: SHARDAE, KLEINMAN. Date of Service: 10/22/2018 9:45 AM Medical Record Number: [id number] Patient Account Number: [id number] Date of Birth/Sex: 07/24/1951 (68 y.o. F) Treating RN: Montey Hora Primary Care Provider: Crist Infante Other Clinician: Referring Provider: Crist Infante Treating Provider/Extender: Melburn Hake, Geovanie Winnett Weeks in Treatment: 5 Information Obtained From Patient Wound History Do you currently have one or more open woundso Yes How many open wounds do you currently haveo  1 Approximately how long have you had your woundso 6 weeks How have you been treating your wound(s) until nowo unna boots Has your wound(s) ever healed and then re-openedo No Have you had any lab work done in the past montho No Have you tested positive for an antibiotic resistant organism (MRSA, VRE)o No Have you tested positive for osteomyelitis (bone infection)o No Have you had any tests for circulation on your legso Yes Who ordered the testo [facility] Where was the test doneo 2015 Constitutional Symptoms (General Health) Complaints and Symptoms: Negative for: Fever; Chills Eyes Medical History: Negative for: Cataracts; Glaucoma; Optic Neuritis Ear/Nose/Mouth/Throat Medical History: Negative for: Chronic sinus problems/congestion; Middle ear problems Hematologic/Lymphatic Medical History: Negative for: Anemia; Hemophilia; Human Immunodeficiency Virus; Sickle Cell Disease Respiratory Complaints and Symptoms: No Complaints or Symptoms Medical History: Negative for: Aspiration; Asthma; Chronic Obstructive Pulmonary Disease (COPD); Pneumothorax; Sleep Apnea; Tuberculosis Cardiovascular Complaints and Symptoms: No Complaints or Symptoms GENEE, RANN ([id number]) Medical History: Positive for: Arrhythmia - a fib; Deep Vein Thrombosis - 13 years ago; Hypertension Negative for: Angina; Congestive Heart Failure; Coronary Artery Disease; Hypotension; Myocardial Infarction; Peripheral Arterial Disease; Peripheral Venous Disease; Phlebitis; Vasculitis Gastrointestinal Medical History: Negative for: Cirrhosis ; Colitis; Crohnos; Hepatitis A; Hepatitis B; Hepatitis C Endocrine Medical History: Negative for: Type I Diabetes; Type II Diabetes Genitourinary Medical History: Negative for: End Stage Renal Disease Immunological Medical History: Negative for: Lupus Erythematosus; Raynaudos; Scleroderma Past Medical History Notes: Wegeners Integumentary (Skin) Medical History: Negative  for: History of Burn; History of pressure wounds Musculoskeletal Medical History: Positive for: Osteoarthritis Negative for: Gout; Rheumatoid Arthritis; Osteomyelitis Past Medical History Notes: OP Neurologic Medical History: Negative for: Dementia; Neuropathy; Quadriplegia; Paraplegia; Seizure Disorder Oncologic Medical History: Positive for: Received Chemotherapy - past for Wegeners Negative for: Received Radiation Psychiatric Complaints and Symptoms: No Complaints or Symptoms Medical History: Negative for: Anorexia/bulimia; Confinement Anxiety RHONNA, HOLSTER ([id number]) Immunizations Pneumococcal Vaccine: Received Pneumococcal Vaccination: Yes Implantable Devices No devices added Family and Social History Cancer: Yes - Father; Diabetes: Yes - Maternal Grandparents; Heart Disease: No; Hereditary Spherocytosis: No; Hypertension: No; Kidney Disease: No; Lung Disease: Yes - Father; Seizures: No; Stroke: Yes - Father; Thyroid Problems: No; Tuberculosis: No; Never smoker; Marital Status - Married; Alcohol Use: Never; Drug Use: No History; Caffeine Use: Daily; Financial Concerns: No; Food, Clothing or Shelter Needs: No; Support System Lacking: No; Transportation Concerns: No; Advanced Directives: Yes (Not Provided); Patient does not want information on Advanced Directives; Medical Power of Attorney: Yes - sister - Florean Hoobler (Not Provided) Physician Affirmation I have reviewed and agree with the above information. Electronic Signature(s) Signed: 10/22/2018 5:24:46 PM By: Montey Hora Signed: 10/23/2018 11:58:16 PM By: Worthy Keeler PA-C Entered By: Worthy Keeler on 10/22/2018 10:44:46 Egley, EDNA JMarland Kitchen ([id number]) -------------------------------------------------------------------------------- SuperBill Details Patient Name: MADHAVI, HAMBLEN. Date of Service: 10/22/2018 Medical Record Number: [id number] Patient Account Number: [id number] Date of Birth/Sex: Jan 14, 1951 (68  y.o. 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 I48.0 Paroxysmal atrial fibrillation I10 Essential (primary) hypertension Z79.01 Long term (current) use of anticoagulants Facility Procedures CPT4 Code Description: 34287681 [id number] o Epifix o 4 x 4 Sq Cm Modifier: Quantity: 6 CPT4 Code Description: 15726203 15271 - SKIN SUB GRAFT TRNK/ARM/LEG ICD-10 Diagnosis Description L97.812 Non-pressure chronic ulcer of other part of right lower leg wit Modifier: h fat layer expo Quantity: 1 sed Physician Procedures CPT4 Code Description: [id number] [id number] - WC PHYS SKIN SUB GRAFT TRNK/ARM/LEG ICD-10 Diagnosis Description M46.803 Non-pressure chronic ulcer of other part of right lower leg with Modifier: fat layer expo Quantity: 1 sed Electronic Signature(s) Signed: 10/23/2018 11:58:16 PM By: Worthy Keeler PA-C Entered By: Worthy Keeler on 10/22/2018 10:45:25

## 2018-10-25 NOTE — Progress Notes (Signed)
LAMONICA, TRUEBA ([id number]) Visit Report for 10/22/2018 Arrival Information Details Patient Name: [name], [name]. Date of Service: 10/22/2018 9:45 AM Medical Record Number: [id number] Patient Account Number: [id number] Date of Birth/Sex: [birth year] (68 y.o. F) Treating RN: Montey Hora Primary Care Darroll Bredeson: Crist Infante Other Clinician: Referring Ayslin Kundert: Crist Infante Treating Aariv Medlock/Extender: Melburn Hake, HOYT Weeks in Treatment: 5 Visit Information History Since Last Visit Added or deleted any medications: No Patient Arrived: Ambulatory Any new allergies or adverse reactions: No Arrival Time: 09:59 Had a fall or experienced change in No Accompanied By: self activities of daily living that may affect Transfer Assistance: None risk of falls: Patient Identification Verified: Yes Signs or symptoms of abuse/neglect since last visito No Secondary Verification Process Yes Hospitalized since last visit: No Completed: Implantable device outside of the clinic excluding No Patient Has Alerts: Yes cellular tissue based products placed in the center Patient Alerts: Patient on Blood since last visit: Thinner Has Dressing in Place as Prescribed: Yes Eliquis Pain Present Now: No Electronic Signature(s) Signed: 10/22/2018 3:35:17 PM By: Lorine Bears RCP, RRT, CHT Entered By: Lorine Bears on 10/22/2018 10:02:04 [name] ([id number]) -------------------------------------------------------------------------------- Lower Extremity Assessment Details Patient Name: [name], [name]. Date of Service: 10/22/2018 9:45 AM Medical Record Number: [id number] Patient Account Number: [id number] Date of Birth/Sex: [birth year] (68 y.o. F) Treating RN: Army Melia Primary Care Antwann Preziosi: Crist Infante Other Clinician: Referring Lyndi Holbein: Crist Infante Treating Jennelle Pinkstaff/Extender: Melburn Hake, HOYT Weeks in Treatment: 5 Edema Assessment Assessed: [Left: No] [Right: No] Edema:  [Left: N] [Right: o] Vascular Assessment Pulses: Dorsalis Pedis Palpable: [Right:Yes] Posterior Tibial Extremity colors, hair growth, and conditions: Extremity Color: [Right:Normal] Hair Growth on Extremity: [Right:Yes] Temperature of Extremity: [Right:Warm] Capillary Refill: [Right:< 3 seconds] Toe Nail Assessment Left: Right: Thick: No Discolored: No Deformed: No Improper Length and Hygiene: No Electronic Signature(s) Signed: 10/22/2018 4:08:43 PM By: Army Melia Entered By: Army Melia on 10/22/2018 10:18:08 [name] ([id number]) -------------------------------------------------------------------------------- Multi Wound Chart Details Patient Name: [name] Date of Service: 10/22/2018 9:45 AM Medical Record Number: [id number] Patient Account Number: [id number] Date of Birth/Sex: 11/21/1950 (68 y.o. F) Treating RN: Montey Hora Primary Care Atharv Barriere: Crist Infante Other Clinician: Referring Adarian Bur: Crist Infante Treating Neco Kling/Extender: Melburn Hake, HOYT Weeks in Treatment: 5 Vital Signs Height(in): 63 Pulse(bpm): 71 Weight(lbs): 174 Blood Pressure(mmHg): 126/72 Body Mass Index(BMI): 31 Temperature(F): 98.6 Respiratory Rate 16 (breaths/min): Photos: [1:No Photos] [N/A:N/A] Wound Location: [1:Right Lower Leg - Lateral] [N/A:N/A] Wounding Event: [1:Bump] [N/A:N/A] Primary Etiology: [1:Auto-immune] [N/A:N/A] Comorbid History: [1:Arrhythmia, Deep Vein Thrombosis, Hypertension, Osteoarthritis, Received Chemotherapy] [N/A:N/A] Date Acquired: [1:08/01/2018] [N/A:N/A] Weeks of Treatment: [1:5] [N/A:N/A] Wound Status: [1:Open] [N/A:N/A] Measurements L x W x D [1:2x2x0.2] [N/A:N/A] (cm) Area (cm) : [1:3.142] [N/A:N/A] Volume (cm) : [1:0.628] [N/A:N/A] % Reduction in Area: [1:27.40%] [N/A:N/A] % Reduction in Volume: [1:71.00%] [N/A:N/A] Classification: [1:Full Thickness Without Exposed Support Structures] [N/A:N/A] Exudate Amount: [1:Medium]  [N/A:N/A] Exudate Type: [1:Purulent] [N/A:N/A] Exudate Color: [1:yellow, brown, green] [N/A:N/A] Wound Margin: [1:Flat and Intact] [N/A:N/A] Granulation Amount: [1:None Present (0%)] [N/A:N/A] Necrotic Amount: [1:Large (67-100%)] [N/A:N/A] Necrotic Tissue: [1:Eschar, Adherent Slough] [N/A:N/A] Exposed Structures: [1:Fat Layer (Subcutaneous Tissue) Exposed: Yes Fascia: No Tendon: No Muscle: No Joint: No Bone: No] [N/A:N/A] Epithelialization: [1:Small (1-33%)] [N/A:N/A] Periwound Skin Texture: [1:Excoriation: No Induration: No Callus: No] [N/A:N/A] Crepitus: No Rash: No Scarring: No Periwound Skin Moisture: Maceration: No N/A N/A Dry/Scaly: No Periwound Skin Color: Atrophie Blanche: No N/A N/A Cyanosis: No Ecchymosis: No Erythema: No Hemosiderin Staining: No Mottled: No Pallor:  No Rubor: No Temperature: No Abnormality N/A N/A Tenderness on Palpation: Yes N/A N/A Wound Preparation: Ulcer Cleansing: N/A N/A Rinsed/Irrigated with Saline, Other: soap and water Topical Anesthetic Applied: Other: lidocaine 4% Treatment Notes Electronic Signature(s) Signed: 10/22/2018 5:24:46 PM By: Montey Hora Entered By: Montey Hora on 10/22/2018 10:31:52 [name] ([id number]) -------------------------------------------------------------------------------- [place] Details Patient Name: [name], [name]. Date of Service: 10/22/2018 9:45 AM Medical Record Number: [id number] Patient Account Number: [id number] Date of Birth/Sex: [birth year] (68 y.o. F) Treating RN: Montey Hora Primary Care Koben Daman: Crist Infante Other Clinician: Referring Shields Pautz: Crist Infante Treating Yolonda Purtle/Extender: Melburn Hake, HOYT Weeks in Treatment: 5 Active Inactive Necrotic Tissue Nursing Diagnoses: Impaired tissue integrity related to necrotic/devitalized tissue Knowledge deficit related to management of necrotic/devitalized tissue Goals: Necrotic/devitalized tissue will be minimized  in the wound bed Date Initiated: 09/12/2018 Target Resolution Date: 10/14/2018 Goal Status: Active Interventions: Assess patient pain level pre-, during and post procedure and prior to discharge Provide education on necrotic tissue and debridement process Treatment Activities: Apply topical anesthetic as ordered : 09/12/2018 Notes: Orientation to the Wound Care Program Nursing Diagnoses: Knowledge deficit related to the wound healing center program Goals: Patient/caregiver will verbalize understanding of the [place] Date Initiated: 09/12/2018 Target Resolution Date: 10/14/2018 Goal Status: Active Interventions: Provide education on orientation to the wound center Notes: Pain, Acute or Chronic Nursing Diagnoses: Pain Management - Non-cyclic Acute (Procedural) Goals: Patient will verbalize adequate pain control and receive pain control interventions during procedures as needed MIYANI, CRONIC ([id number]) Date Initiated: 09/12/2018 Target Resolution Date: 10/14/2018 Goal Status: Active Interventions: Assess comfort goal upon admission Encourage patient to take pain medications as prescribed Treatment Activities: Administer pain control measures as ordered : 09/12/2018 Notes: Wound/Skin Impairment Nursing Diagnoses: Impaired tissue integrity Goals: Ulcer/skin breakdown will have a volume reduction of 30% by week 4 Date Initiated: 09/12/2018 Target Resolution Date: 10/14/2018 Goal Status: Active Interventions: Assess patient/caregiver ability to obtain necessary supplies Assess patient/caregiver ability to perform ulcer/skin care regimen upon admission and as needed Assess ulceration(s) every visit Treatment Activities: Referred to DME Diyari Cherne for dressing supplies : 09/12/2018 Skin care regimen initiated : 09/12/2018 Notes: Electronic Signature(s) Signed: 10/22/2018 5:24:46 PM By: Montey Hora Entered By: Montey Hora on 10/22/2018 10:31:29 [name] ([id number]) -------------------------------------------------------------------------------- Pain Assessment Details Patient Name: [name], [name]. Date of Service: 10/22/2018 9:45 AM Medical Record Number: [id number] Patient Account Number: [id number] Date of Birth/Sex: 02-May-1951 (68 y.o. F) Treating RN: Montey Hora Primary Care Amariss Detamore: Crist Infante Other Clinician: Referring Levonne Carreras: Crist Infante Treating Jaxten Brosh/Extender: Melburn Hake, HOYT Weeks in Treatment: 5 Active Problems Location of Pain Severity and Description of Pain Patient Has Paino No Site Locations Pain Management and Medication Current Pain Management: Electronic Signature(s) Signed: 10/22/2018 3:35:17 PM By: Paulla Fore, RRT, CHT Signed: 10/22/2018 5:24:46 PM By: Montey Hora Entered By: Lorine Bears on 10/22/2018 10:02:10 [name] ([id number]) -------------------------------------------------------------------------------- Patient/Caregiver Education Details Patient Name: [name], [name]. Date of Service: 10/22/2018 9:45 AM Medical Record Number: [id number] Patient Account Number: [id number] Date of Birth/Gender: 1951/05/03 (68 y.o. F) Treating RN: Montey Hora Primary Care Physician: Crist Infante Other Clinician: Referring Physician: Crist Infante Treating Physician/Extender: Sharalyn Ink in Treatment: 5 Education Assessment Education Provided To: Patient Education Topics Provided Venous: Handouts: Other: wrap precautions Methods: Explain/Verbal Responses: State content correctly Electronic Signature(s) Signed: 10/22/2018 5:24:46 PM By: Montey Hora Entered By: Montey Hora on 10/22/2018 10:37:08 [name] ([id number]) -------------------------------------------------------------------------------- Wound Assessment Details  Patient Name: [name], [name]. Date of Service: 10/22/2018 9:45 AM Medical Record Number: [id number] Patient Account  Number: [id number] Date of Birth/Sex: [birth year] (68 y.o. F) Treating RN: Army Melia Primary Care Madix Blowe: Crist Infante Other Clinician: Referring Jasmin Trumbull: Crist Infante Treating Emori Mumme/Extender: Melburn Hake, HOYT Weeks in Treatment: 5 Wound Status Wound Number: 1 Primary Auto-immune Etiology: Wound Location: Right Lower Leg - Lateral Wound Open Wounding Event: Bump Status: Date Acquired: 08/01/2018 Comorbid Arrhythmia, Deep Vein Thrombosis, Weeks Of Treatment: 5 History: Hypertension, Osteoarthritis, Received Clustered Wound: No Chemotherapy Photos Photo Uploaded By: Army Melia on 10/22/2018 11:41:55 Wound Measurements Length: (cm) 2 Width: (cm) 2 Depth: (cm) 0.2 Area: (cm) 3.142 Volume: (cm) 0.628 % Reduction in Area: 27.4% % Reduction in Volume: 71% Epithelialization: Small (1-33%) Tunneling: No Undermining: No Wound Description Full Thickness Without Exposed Support Foul Odo Classification: Structures Slough/F Wound Margin: Flat and Intact Exudate Medium Amount: Exudate Type: Purulent Exudate Color: yellow, brown, green r After Cleansing: No ibrino Yes Wound Bed Granulation Amount: None Present (0%) Exposed Structure Necrotic Amount: Large (67-100%) Fascia Exposed: No Necrotic Quality: Eschar, Adherent Slough Fat Layer (Subcutaneous Tissue) Exposed: Yes Tendon Exposed: No Muscle Exposed: No Joint Exposed: No Bone Exposed: No Perris, Joyce J. ([id number]) Periwound Skin Texture Texture Color No Abnormalities Noted: No No Abnormalities Noted: No Callus: No Atrophie Blanche: No Crepitus: No Cyanosis: No Excoriation: No Ecchymosis: No Induration: No Erythema: No Rash: No Hemosiderin Staining: No Scarring: No Mottled: No Pallor: No Moisture Rubor: No No Abnormalities Noted: No Dry / Scaly: No Temperature / Pain Maceration: No Temperature: No Abnormality Tenderness on Palpation: Yes Wound Preparation Ulcer  Cleansing: Rinsed/Irrigated with Saline, Other: soap and water, Topical Anesthetic Applied: Other: lidocaine 4%, Electronic Signature(s) Signed: 10/22/2018 4:08:43 PM By: Army Melia Entered By: Army Melia on 10/22/2018 10:17:47 [name] ([id number]) -------------------------------------------------------------------------------- Vitals Details Patient Name: [name] Date of Service: 10/22/2018 9:45 AM Medical Record Number: [id number] Patient Account Number: [id number] Date of Birth/Sex: 12-Jun-1951 (68 y.o. F) Treating RN: Montey Hora Primary Care Devone Tousley: Crist Infante Other Clinician: Referring Kimya Mccahill: Crist Infante Treating Lazariah Savard/Extender: Melburn Hake, HOYT Weeks in Treatment: 5 Vital Signs Time Taken: 10:02 Temperature (F): 98.6 Height (in): 63 Pulse (bpm): 71 Weight (lbs): 174 Respiratory Rate (breaths/min): 16 Body Mass Index (BMI): 30.8 Blood Pressure (mmHg): 126/72 Reference Range: 80 - 120 mg / dl Electronic Signature(s) Signed: 10/22/2018 3:35:17 PM By: Lorine Bears RCP, RRT, CHT Entered By: Lorine Bears on 10/22/2018 10:05:11

## 2018-10-28 DIAGNOSIS — I8311 Varicose veins of right lower extremity with inflammation: Secondary | ICD-10-CM | POA: Diagnosis not present

## 2018-10-28 DIAGNOSIS — Z79899 Other long term (current) drug therapy: Secondary | ICD-10-CM | POA: Diagnosis not present

## 2018-10-28 DIAGNOSIS — I872 Venous insufficiency (chronic) (peripheral): Secondary | ICD-10-CM | POA: Diagnosis not present

## 2018-10-28 DIAGNOSIS — I8312 Varicose veins of left lower extremity with inflammation: Secondary | ICD-10-CM | POA: Diagnosis not present

## 2018-10-28 DIAGNOSIS — L97529 Non-pressure chronic ulcer of other part of left foot with unspecified severity: Secondary | ICD-10-CM | POA: Diagnosis not present

## 2018-10-29 ENCOUNTER — Encounter: Payer: Medicare Other | Admitting: Physician Assistant

## 2018-10-29 DIAGNOSIS — M313 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: Secondary | ICD-10-CM | POA: Diagnosis not present

## 2018-10-29 DIAGNOSIS — I480 Paroxysmal atrial fibrillation: Secondary | ICD-10-CM | POA: Diagnosis not present

## 2018-10-29 DIAGNOSIS — E11622 Type 2 diabetes mellitus with other skin ulcer: Secondary | ICD-10-CM | POA: Diagnosis not present

## 2018-10-29 DIAGNOSIS — I10 Essential (primary) hypertension: Secondary | ICD-10-CM | POA: Diagnosis not present

## 2018-10-29 DIAGNOSIS — M359 Systemic involvement of connective tissue, unspecified: Secondary | ICD-10-CM | POA: Diagnosis not present

## 2018-10-29 DIAGNOSIS — L97812 Non-pressure chronic ulcer of other part of right lower leg with fat layer exposed: Secondary | ICD-10-CM | POA: Diagnosis not present

## 2018-10-29 DIAGNOSIS — M1990 Unspecified osteoarthritis, unspecified site: Secondary | ICD-10-CM | POA: Diagnosis not present

## 2018-10-31 NOTE — Progress Notes (Addendum)
[name] ([id number]) Visit Report for 10/29/2018 Arrival Information Details Patient Name: [name], [name]. Date of Service: 10/29/2018 10:00 AM Medical Record Number: [id number] Patient Account Number: [id number] Date of Birth/Sex: 04/30/1951 (68 y.o. F) Treating RN: Montey Hora Primary Care Hermilo Dutter: Crist Infante Other Clinician: Referring Basel Defalco: Crist Infante Treating Sharniece Gibbon/Extender: Melburn Hake, HOYT Weeks in Treatment: 6 Visit Information History Since Last Visit Added or deleted any medications: No Patient Arrived: Ambulatory Any new allergies or adverse reactions: No Arrival Time: 10:20 Had a fall or experienced change in No Accompanied By: self activities of daily living that may affect Transfer Assistance: None risk of falls: Patient Identification Verified: Yes Signs or symptoms of abuse/neglect since last visito No Secondary Verification Process Yes Hospitalized since last visit: No Completed: Implantable device outside of the clinic excluding No Patient Has Alerts: Yes cellular tissue based products placed in the center Patient Alerts: Patient on Blood since last visit: Thinner Has Dressing in Place as Prescribed: Yes Eliquis Pain Present Now: No Electronic Signature(s) Signed: 10/29/2018 3:28:00 PM By: Lorine Bears RCP, RRT, CHT Entered By: Lorine Bears on 10/29/2018 10:21:03 [name] ([id number]) -------------------------------------------------------------------------------- Encounter Discharge Information Details Patient Name: [name]. Date of Service: 10/29/2018 10:00 AM Medical Record Number: [id number] Patient Account Number: [id number] Date of Birth/Sex: 08-21-1951 (68 y.o. F) Treating RN: Montey Hora Primary Care Tandy Lewin: Crist Infante Other Clinician: Referring Treyon Wymore: Crist Infante Treating Elisama Thissen/Extender: Melburn Hake, HOYT Weeks in Treatment: 6 Encounter Discharge Information Items Post  Procedure Vitals Discharge Condition: Stable Temperature (F): 98.4 Ambulatory Status: Ambulatory Pulse (bpm): 78 Discharge Destination: Home Respiratory Rate (breaths/min): 16 Transportation: Private Auto Blood Pressure (mmHg): 95/63 Accompanied By: self Schedule Follow-up Appointment: Yes Clinical Summary of Care: Electronic Signature(s) Signed: 10/30/2018 4:23:17 PM By: Montey Hora Entered By: Montey Hora on 10/29/2018 11:00:53 [name] ([id number]) -------------------------------------------------------------------------------- Lower Extremity Assessment Details Patient Name: [name]. Date of Service: 10/29/2018 10:00 AM Medical Record Number: [id number] Patient Account Number: [id number] Date of Birth/Sex: [birth year] (68 y.o. F) Treating RN: Army Melia Primary Care Jodie Leiner: Crist Infante Other Clinician: Referring Cameo Schmiesing: Crist Infante Treating Analiah Drum/Extender: Melburn Hake, HOYT Weeks in Treatment: 6 Edema Assessment Assessed: [Left: No] [Right: No] Edema: [Left: N] [Right: o] Electronic Signature(s) Signed: 10/29/2018 4:39:27 PM By: Army Melia Entered By: Army Melia on 10/29/2018 10:29:44 [name] ([id number]) -------------------------------------------------------------------------------- Multi Wound Chart Details Patient Name: [name]. Date of Service: 10/29/2018 10:00 AM Medical Record Number: [id number] Patient Account Number: [id number] Date of Birth/Sex: 04/16/1951 (68 y.o. 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] [N/A:N/A] Comorbid History: [1:Arrhythmia, Deep Vein Thrombosis, Hypertension, Osteoarthritis, Received Chemotherapy] [N/A:N/A] Date Acquired: [1:08/01/2018] [N/A:N/A] Weeks of Treatment: [1:6] [N/A:N/A] Wound Status: [1:Open] [N/A:N/A] Measurements L x W x D [1:2x1.7x0.2] [N/A:N/A] (cm) Area (cm) : [1:2.67] [N/A:N/A] Volume (cm) : [1:0.534] [N/A:N/A] % Reduction in Area: [1:38.30%] [N/A:N/A] % Reduction in Volume: [1:75.30%] [N/A:N/A] Classification: [1:Full Thickness Without Exposed Support Structures] [N/A:N/A] Exudate Amount: [1:Medium] [N/A:N/A] Exudate Type: [1:Purulent] [N/A:N/A] Exudate Color: [1:yellow, brown, green] [N/A:N/A] Wound Margin: [1:Flat and Intact] [N/A:N/A] Granulation Amount: [1:None Present (0%)] [N/A:N/A] Necrotic Amount: [1:Large (67-100%)] [N/A:N/A] Necrotic Tissue: [1:Eschar,  Adherent Slough] [N/A:N/A] Exposed Structures: [1:Fat Layer (Subcutaneous Tissue) Exposed: Yes Fascia: No Tendon: No Muscle: No Joint: No Bone: No] [N/A:N/A] Epithelialization: [1:Small (1-33%)] [N/A:N/A] Periwound Skin Texture: [1:Excoriation: No Induration: No Callus: No] [N/A:N/A] Crepitus: No Rash: No Scarring: No Periwound Skin Moisture: Maceration: No N/A N/A Dry/Scaly: No Periwound Skin Color: Atrophie Blanche: No N/A N/A Cyanosis: No Ecchymosis: No Erythema: No Hemosiderin Staining: No Mottled: No Pallor: No Rubor: No Temperature: No Abnormality N/A N/A Tenderness on Palpation: Yes N/A N/A Wound Preparation: Ulcer Cleansing: N/A N/A Rinsed/Irrigated with Saline, Other: soap and water Topical Anesthetic Applied: Other: lidocaine 4% Treatment Notes Electronic Signature(s) Signed: 10/30/2018 4:23:17 PM By: Montey Hora Entered By: Montey Hora on 10/29/2018 10:48:44 [name] ([id number]) -------------------------------------------------------------------------------- [place] Details Patient Name: [name]. Date of Service: 10/29/2018 10:00 AM Medical Record Number: [id number] Patient Account Number: [id number] Date of Birth/Sex: 1950/10/29 (68 y.o. F) Treating RN: Montey Hora Primary Care Kaydon Husby: Crist Infante Other Clinician: Referring Denijah Karrer: Crist Infante Treating Volanda Mangine/Extender: Sharalyn Ink in Treatment: 6 Active Inactive Electronic Signature(s) Signed: 11/05/2018 10:27:48 AM By: Gretta Cool, BSN, RN, CWS, [name] RN, BSN Signed: 11/14/2018 10:13:59 AM By: Montey Hora Previous Signature: 10/30/2018 4:23:17 PM Version By: Montey Hora Entered By: Gretta Cool BSN, RN, CWS, [name] on 11/05/2018 10:27:47 [name] ([id number]) -------------------------------------------------------------------------------- Pain Assessment Details Patient Name: SHAWANDA, SIEVERT. Date of Service: 10/29/2018 10:00 AM Medical Record Number: [id number] Patient Account Number: [id number] Date of Birth/Sex: Feb 28, 1951 (68 y.o. F) Treating RN: Montey Hora Primary Care Yliana Gravois: Crist Infante Other Clinician: Referring Cian Costanzo: Crist Infante Treating Tadeo Besecker/Extender: Melburn Hake, HOYT Weeks in Treatment: 6 Active Problems Location of Pain Severity and Description of Pain Patient Has Paino No Site Locations Pain Management and Medication Current Pain Management: Electronic Signature(s) Signed: 10/29/2018 3:28:00 PM By: Paulla Fore, RRT, CHT Signed: 10/30/2018 4:23:17 PM By: Montey Hora Entered By: Lorine Bears on 10/29/2018 10:21:10 [name] ([id number]) -------------------------------------------------------------------------------- Patient/Caregiver Education Details Patient Name: KEARA, PAGLIARULO. Date of Service: 10/29/2018 10:00 AM Medical Record Number: [id number] Patient Account Number: [id number] Date of Birth/Gender: 09/09/1950 (68 y.o. F) Treating RN: Montey Hora Primary Care Physician: Crist Infante  Other Clinician: Referring Physician: Crist Infante Treating Physician/Extender: Sharalyn Ink in Treatment: 6 Education Assessment Education Provided To: Patient Education Topics Provided Venous: Handouts: Other: need for ongoing compression Methods: Explain/Verbal Responses: State content correctly Electronic Signature(s) Signed: 10/30/2018 4:23:17 PM By: Montey Hora Entered By: Montey Hora on 10/29/2018 10:49:29 [place], Ulyses Southward ([id number]) -------------------------------------------------------------------------------- Wound Assessment Details Patient Name: EILIS, CHESTNUTT. Date of Service: 10/29/2018 10:00 AM Medical Record Number: [id number] Patient Account Number: [id number] Date of Birth/Sex: [birth year] (68 y.o. F) Treating RN: Army Melia Primary Care Skyrah Krupp: Crist Infante Other Clinician: Referring Derrick Tiegs: Crist Infante Treating Sumaiya Arruda/Extender: Melburn Hake, HOYT Weeks in Treatment: 6 Wound Status Wound Number: 1 Primary Auto-immune Etiology: Wound Location: Right Lower Leg - Lateral Wound Open Wounding Event: Bump Status: Date Acquired: 08/01/2018 Comorbid Arrhythmia, Deep Vein Thrombosis, Weeks Of Treatment: 6 History: Hypertension, Osteoarthritis, Received Clustered Wound: No Chemotherapy Photos Photo Uploaded By: Army Melia on 10/29/2018 16:33:41 Wound Measurements Length: (cm) 2 Width: (cm) 1.7 Depth: (cm) 0.2 Area: (cm) 2.67 Volume: (cm) 0.534 % Reduction in Area: 38.3% % Reduction in Volume: 75.3% Epithelialization: Small (1-33%) Tunneling: No Undermining: No Wound Description Full Thickness Without Exposed Support Foul O Classification: Structures Slough Wound Margin: Flat and Intact Exudate Medium Amount: Exudate Type: Purulent Exudate Color: yellow, brown, green  dor After Cleansing: No /Fibrino Yes Wound Bed Granulation Amount: None Present (0%) Exposed Structure Necrotic Amount: Large (67-100%) Fascia Exposed:  No Necrotic Quality: Eschar, Adherent Slough Fat Layer (Subcutaneous Tissue) Exposed: Yes Tendon Exposed: No Muscle Exposed: No Joint Exposed: No Bone Exposed: No Albert, [name] ([id number]) Periwound Skin Texture Texture Color No Abnormalities Noted: No No Abnormalities Noted: No Callus: No Atrophie Blanche: No Crepitus: No Cyanosis: No Excoriation: No Ecchymosis: No Induration: No Erythema: No Rash: No Hemosiderin Staining: No Scarring: No Mottled: No Pallor: No Moisture Rubor: No No Abnormalities Noted: No Dry / Scaly: No Temperature / Pain Maceration: No Temperature: No Abnormality Tenderness on Palpation: Yes Wound Preparation Ulcer Cleansing: Rinsed/Irrigated with Saline, Other: soap and water, Topical Anesthetic Applied: Other: lidocaine 4%, Electronic Signature(s) Signed: 10/29/2018 4:39:27 PM By: Army Melia Entered By: Army Melia on 10/29/2018 10:29:21 [name] ([id number]) -------------------------------------------------------------------------------- Vitals Details Patient Name: [name] Date of Service: 10/29/2018 10:00 AM Medical Record Number: [id number] Patient Account Number: [id number] Date of Birth/Sex: 08-Dec-1950 (68 y.o. F) Treating RN: Montey Hora Primary Care Imelda Dandridge: Crist Infante Other Clinician: Referring Mattison Stuckey: Crist Infante Treating Dashley Monts/Extender: Melburn Hake, HOYT Weeks in Treatment: 6 Vital Signs Time Taken: 10:21 Temperature (F): 98.4 Height (in): 63 Pulse (bpm): 78 Weight (lbs): 174 Respiratory Rate (breaths/min): 16 Body Mass Index (BMI): 30.8 Blood Pressure (mmHg): 95/63 Reference Range: 80 - 120 mg / dl Electronic Signature(s) Signed: 10/29/2018 3:28:00 PM By: Lorine Bears RCP, RRT, CHT Entered By: Lorine Bears on 10/29/2018 10:24:31

## 2018-10-31 NOTE — Progress Notes (Signed)
[name], [name] ([id number]) Visit Report for 10/29/2018 Chief Complaint Document Details Patient Name: [name], [name]. Date of Service: 10/29/2018 10:00 AM Medical Record Number: [id number] Patient Account Number: [id number] Date of Birth/Sex: 12-30-1950 (68 y.o. F) Treating RN: Montey Hora Primary Care Provider: Crist Infante Other Clinician: Referring Provider: Crist Infante Treating Provider/Extender: Melburn Hake, HOYT Weeks in Treatment: 6 Information Obtained from: Patient Chief Complaint Right LE Ulcer Electronic Signature(s) Signed: 10/30/2018 1:14:32 PM By: Worthy Keeler PA-C Entered By: Worthy Keeler on 10/29/2018 10:47:08 [name] ([id number]) -------------------------------------------------------------------------------- Cellular or Tissue Based Product Details Patient Name: [name], [name]. Date of Service: 10/29/2018 10:00 AM Medical Record Number: [id number] Patient Account Number: [id number] Date of Birth/Sex: [birth year] (68 y.o. F) Treating RN: Montey Hora Primary Care Provider: Crist Infante Other Clinician: Referring Provider: Crist Infante Treating Provider/Extender: Melburn Hake, HOYT Weeks in Treatment: 6 Cellular or Tissue Based Wound #1 Right,Lateral Lower Leg Product Type Applied to: Performed By: Physician STONE III, HOYT E., PA-C Cellular or Tissue Based Epifix Product Type: Level of Consciousness (Pre- Awake and Alert procedure): Pre-procedure Verification/Time Yes - 10:56 Out Taken: Location: trunk / arms / legs Wound Size (sq cm): 3.4 Product Size (sq cm): 6 Waste Size (sq cm): 0 Amount of Product Applied (sq cm): 6 Instrument Used: Forceps, Scissors Lot #: [id number] Expiration Date: 07/22/2023 Fenestrated: No Reconstituted: Yes Solution Type: normal saline Solution Amount: 5ML Lot #: [id number] Solution Expiration Date: 11/20/2019 Secured: Yes Secured With: Steri-Strips Dressing Applied: Yes Primary Dressing: mepitel one Procedural  Pain: 0 Post Procedural Pain: 0 Response to Treatment: Procedure was tolerated well Level of Consciousness (Post- Awake and Alert procedure): Post Procedure Diagnosis Same as Pre-procedure Electronic Signature(s) Signed: 10/30/2018 4:23:17 PM By: Montey Hora Entered By: Montey Hora on 10/29/2018 10:58:30 [name] ([id number]) -------------------------------------------------------------------------------- Debridement Details Patient Name: [name]. Date of Service: 10/29/2018 10:00 AM Medical Record Number: [id number] Patient Account Number: [id number] Date of Birth/Sex: 29-May-1951 (68 y.o. F) Treating RN: Montey Hora Primary Care Provider: Crist Infante Other Clinician: Referring Provider: Crist Infante Treating Provider/Extender: Melburn Hake, HOYT Weeks in Treatment: 6 Debridement Performed for Wound #1 Right,Lateral Lower Leg Assessment: Performed By: Physician STONE III, HOYT E., PA-C Debridement Type: Debridement Level of Consciousness (Pre- Awake and Alert procedure): Pre-procedure Verification/Time Yes - 10:50 Out Taken: Start Time: 10:50 Pain Control: Lidocaine 4% Topical Solution Total Area Debrided (L x W): 2 (cm) x 1.7 (cm) = 3.4 (cm) Tissue and other material Viable, Non-Viable, Slough, Subcutaneous, Slough debrided: Level: Skin/Subcutaneous Tissue Debridement Description: Excisional Instrument: Curette Bleeding: Minimum Hemostasis Achieved: Pressure End Time: 10:53 Procedural Pain: 0 Post Procedural Pain: 0 Response to Treatment: Procedure was tolerated well Level of Consciousness Awake and Alert (Post-procedure): Post Debridement Measurements of Total Wound Length: (cm) 2 Width: (cm) 1.7 Depth: (cm) 0.3 Volume: (cm) 0.801 Character of Wound/Ulcer Post Debridement: Improved Post Procedure Diagnosis Same as Pre-procedure Electronic Signature(s) Signed: 10/30/2018 1:14:32 PM By: Worthy Keeler PA-C Signed: 10/30/2018 4:23:17 PM  By: Montey Hora Entered By: Montey Hora on 10/29/2018 10:56:03 [name] ([id number]) -------------------------------------------------------------------------------- HPI Details Patient Name: [name], [name]. Date of Service: 10/29/2018 10:00 AM Medical Record Number: [id number] Patient Account Number: [id number] Date of Birth/Sex: 04-11-1951 (68 y.o. 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 She states that she often has areas like this that will come up but normally not to the severity. She's typically able to take care of these herself. However she's been having a lot of trouble since this past summer in particular. 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 She was most recently treated with this by her primary care provider. Fortunately that seems to have cleared up any infection that she may have had. Nonetheless other pertinent medical history includes the chronic long-term use of Eliquis secondary to atrial fibrillation. Her ABI appear to be okay today. She did have a DVT 13 years ago and this lag but has not had anything recently. The Eliquis seems to be beneficial in this regard. Otherwise there's no evidence of active infection at this time. No fevers, chills, nausea, or vomiting noted at this time. 09/19/18 on evaluation today patient presents for follow-up concerning her ongoing lower extremity wound. This is her second visit here in our clinic. She does have slough covering the surface of the wound today. Fortunately there is no signs of infection at this time.  Overall I have been pleased with the interval change although she still has a lot of Slough I do feel like the Iodoflex was of benefit for her. We did have to change her wrap once in the weeks since I last saw her fortunately after that changed nothing seem to smell or drain through. 09/27/18 on evaluation today patient appears to be doing better in regard to her right lateral lower extremity ulcer. She's been tolerating the dressing changes without complication. Fortunately there is no sign of infection at this time. With that being said we have had to change the wrap at least once in the interim between when we see her and when she comes back in for evaluation due to the amount of drainage an odor. With that being said she is getting very frustrated with the length of time is taken get this to heal. Fortunately there is no evidence of anything worsening and a lot of the slough is improving she has good granulation seems to be peeking out from underneath. She is still having discomfort unfortunately. 10/03/18 on evaluation today patient's wound actually appears to be doing better from the standpoint of feeling in. With that being said she actually does have some slough covering the surface of the wound unfortunately. This is going to require sharp debridement prior to application of EpiFix. 10/15/18 on evaluation today patient actually appears to be doing much better in regard to her wound. I do believe that the EpiFix has been of benefit for her which is excellent news. Overall I think that this is something I would definitely recommend continuing I've seen definite progress. 10/22/18 on evaluation today patient actually appears to be doing very well in regard to her leg ulcer. This is shown signs of excellent improvement measurement wise she is continuing to week by week make great progress in my pinion. Overall very pleased in this regard. Fortunately there's no signs of infection at this time. I do  believe that the EpiFix has been beneficial for her. 10/29/18 on evaluation today patient actually appears to be doing very well today. The EpiFix does seem to be benefiting her as far as the overall improvement we have seen is concerned. Fortunately there's no signs of infection at this time  and again her pain is much less than what it used to be. Overall I feel like she's making excellent progress. Electronic Signature(s) Signed: 10/30/2018 1:14:32 PM By: Worthy Keeler PA-C Entered By: Worthy Keeler on 10/29/2018 11:06:00 [name] ([id number]) -------------------------------------------------------------------------------- Physical Exam Details Patient Name: [name], [name]. Date of Service: 10/29/2018 10:00 AM Medical Record Number: [id number] Patient Account Number: [id number] Date of Birth/Sex: 01/03/1951 (68 y.o. F) Treating RN: Montey Hora Primary Care Provider: Crist Infante Other Clinician: Referring Provider: Crist Infante Treating Provider/Extender: STONE III, HOYT Weeks in Treatment: 6 Constitutional Well-nourished and well-hydrated in no acute distress. Respiratory normal breathing without difficulty. Psychiatric this patient is able to make decisions and demonstrates good insight into disease process. Alert and Oriented x 3. pleasant and cooperative. Notes Patient's wound bed currently did require some mild sharp agreement clear way the necrotic tissue and leftover EpiFix from the surface of the wound once this will gently debrided away other than reapply the EpiFix secondarily with the new piece today and this was secured with [employer] and Steri-Strips. Electronic Signature(s) Signed: 10/30/2018 1:14:32 PM By: Worthy Keeler PA-C Entered By: Worthy Keeler on 10/29/2018 11:06:27 [name] ([id number]) -------------------------------------------------------------------------------- Physician Orders Details Patient Name: [name], [name]. Date of Service:  10/29/2018 10:00 AM Medical Record Number: [id number] Patient Account Number: [id number] Date of Birth/Sex: 1951-08-18 (68 y.o. 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 o May shower with protection. - Please do not get your wrap wet Anesthetic (add to Medication List) Wound #1 Right,Lateral Lower Leg o Topical Lidocaine 4% cream applied to wound bed prior to debridement (In Clinic Only). o Benzocaine Topical Anesthetic Spray applied to wound bed prior to debridement (In Clinic Only). Skin Barriers/Peri-Wound Care o Triamcinolone Acetonide Ointment (TCA) - to right lower leg Primary Wound Dressing Wound #1 Right,Lateral Lower Leg o Other: - Epifix Secondary Dressing Wound #1 Right,Lateral Lower Leg o ABD pad o Other - carboflex Dressing Change Frequency Wound #1 Right,Lateral Lower Leg o Change dressing every week o Other: - if needed Follow-up Appointments Wound #1 Right,Lateral Lower Leg o Return Appointment in 1 week. Edema Control Wound #1 Right,Lateral Lower Leg NINAH, MOCCIO ([id number]) o 3 Layer Compression System - Right Lower Extremity - unna to anchor - use Kerlix rather than cotton layer o Elevate legs to the level of the heart and pump ankles as often as possible Advanced Therapies Wound #1 Right,Lateral Lower Leg o EpiFix application in clinic; including contact layer,  fixation with steri strips, dry gauze and cover dressing. Electronic Signature(s) Signed: 10/30/2018 1:14:32 PM By: Worthy Keeler PA-C Signed: 10/30/2018 4:23:17 PM By: Montey Hora Entered By: Montey Hora on 10/29/2018 10:56:57 [name] ([id number]) -------------------------------------------------------------------------------- Problem List Details Patient Name: [name], [name]. Date of Service: 10/29/2018 10:00 AM Medical Record Number: [id number] Patient Account Number: [id number] Date of Birth/Sex: 1951/05/17 (68 y.o. 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 09/12/2018 No Yes L97.812 Non-pressure chronic ulcer of other part of right lower leg 09/12/2018 No Yes with fat layer exposed I48.0 Paroxysmal atrial fibrillation 09/12/2018 No Yes I10 Essential (primary) hypertension 09/12/2018 No Yes Z79.01 Long term (current) use of anticoagulants 09/12/2018 No Yes Inactive Problems Resolved Problems Electronic Signature(s) Signed: 10/30/2018 1:14:32 PM By: Worthy Keeler PA-C Entered By: Worthy Keeler on 10/29/2018 10:47:04 [name] ([id number]) -------------------------------------------------------------------------------- Progress Note Details Patient Name: [name] Date of Service: 10/29/2018 10:00 AM Medical Record Number: [id number] Patient Account Number: [id number] Date of Birth/Sex: 1951-08-19 (68 y.o. 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 She states that she often has areas like this that will come up but normally not to the severity. She's typically able to take care of these herself. However she's been having a lot of trouble since this past summer in particular. 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 She was most recently treated with this by her primary care provider. Fortunately that seems to have cleared up any infection that she may have had. Nonetheless other pertinent medical history includes the chronic long-term use of Eliquis secondary to atrial fibrillation. Her ABI appear to be okay today. She did have a DVT 13 years ago and this lag but has not had anything recently. The Eliquis seems to be beneficial in this regard. Otherwise there's no evidence of active infection at this time. No fevers, chills, nausea, or vomiting noted at this time. 09/19/18 on evaluation today patient presents for follow-up concerning her ongoing lower extremity wound. This is her second visit here in our clinic. She does have slough covering the surface of the wound today. Fortunately there is no signs of infection at this time. Overall I have been pleased with the interval change although she still has a lot of Slough I do feel like the Iodoflex was of benefit for her. We did have to change her wrap once in the weeks since I last saw her fortunately after that changed nothing seem to smell or drain through. 09/27/18 on evaluation today patient appears to be doing better in regard to her right lateral lower extremity ulcer. She's been tolerating the dressing changes without complication. Fortunately there is no sign of infection at this time. With that being said we have had to change the wrap at  least once in the interim between when we see her and when she comes back in for evaluation due to the amount of drainage an odor. With that being said she is getting very frustrated with the length of time is taken get this to heal. Fortunately there is no evidence of anything worsening and a lot of the slough is improving she has good granulation seems to be peeking out from underneath. She is still having discomfort unfortunately. 10/03/18 on evaluation today patient's wound actually appears to be doing better from the standpoint of feeling in. With that being said she actually does have some slough covering the surface of the  wound unfortunately. This is going to require sharp debridement prior to application of EpiFix. 10/15/18 on evaluation today patient actually appears to be doing much better in regard to her wound. I do believe that the EpiFix has been of benefit for her which is excellent news. Overall I think that this is something I would definitely recommend continuing I've seen definite progress. 10/22/18 on evaluation today patient actually appears to be doing very well in regard to her leg ulcer. This is shown signs of excellent improvement measurement wise she is continuing to week by week make great progress in my pinion. Overall very pleased in this regard. Fortunately there's no signs of infection at this time. I do believe that the EpiFix has been beneficial for her. 10/29/18 on evaluation today patient actually appears to be doing very well today. The EpiFix does seem to be benefiting her as far as the overall improvement we have seen is concerned. Fortunately there's no signs of infection at this time and again her pain is much less than what it used to be. Overall I feel like she's making excellent progress. [name], [name] ([id number]) Patient History Information obtained from Patient. Family History Cancer - Father, Diabetes - Maternal Grandparents, Lung Disease - Father,  Stroke - Father, No family history of Heart Disease, Hereditary Spherocytosis, Hypertension, Kidney Disease, Seizures, Thyroid Problems, Tuberculosis. Social History Never smoker, Marital Status - Married, Alcohol Use - Never, Drug Use - No History, Caffeine Use - Daily. Medical History Eyes Denies history of Cataracts, Glaucoma, Optic Neuritis Ear/Nose/Mouth/Throat Denies history of Chronic sinus problems/congestion, Middle ear problems Hematologic/Lymphatic Denies history of Anemia, Hemophilia, Human Immunodeficiency Virus, Sickle Cell Disease Respiratory Denies history of Aspiration, Asthma, Chronic Obstructive Pulmonary Disease (COPD), Pneumothorax, Sleep Apnea, Tuberculosis Cardiovascular Patient has history of Arrhythmia - a fib, Deep Vein Thrombosis - 13 years ago, Hypertension Denies history of Angina, Congestive Heart Failure, Coronary Artery Disease, Hypotension, Myocardial Infarction, Peripheral Arterial Disease, Peripheral Venous Disease, Phlebitis, Vasculitis Gastrointestinal Denies history of Cirrhosis , Colitis, Crohn s, Hepatitis A, Hepatitis B, Hepatitis C Endocrine Denies history of Type I Diabetes, Type II Diabetes Genitourinary Denies history of End Stage Renal Disease Immunological Denies history of Lupus Erythematosus, Raynaud s, Scleroderma Integumentary (Skin) Denies history of History of Burn, History of pressure wounds Musculoskeletal Patient has history of Osteoarthritis Denies history of Gout, Rheumatoid Arthritis, Osteomyelitis Neurologic Denies history of Dementia, Neuropathy, Quadriplegia, Paraplegia, Seizure Disorder Oncologic Patient has history of Received Chemotherapy - past for Wegeners Denies history of Received Radiation Psychiatric Denies history of Anorexia/bulimia, Confinement Anxiety Medical And Surgical History Notes Immunological Wegeners Musculoskeletal OP Review of Systems (ROS) Constitutional Symptoms (General Health) Denies  complaints or symptoms of Fever, Chills. Respiratory The patient has no complaints or symptoms. Cardiovascular The patient has no complaints or symptoms. Psychiatric [name], [name] ([id number]) The patient has no complaints or symptoms. Objective Constitutional Well-nourished and well-hydrated in no acute distress. Vitals Time Taken: 10:21 AM, Height: 63 in, Weight: 174 lbs, BMI: 30.8, Temperature: 98.4 F, Pulse: 78 bpm, Respiratory Rate: 16 breaths/min, Blood Pressure: 95/63 mmHg. Respiratory normal breathing without difficulty. Psychiatric this patient is able to make decisions and demonstrates good insight into disease process. Alert and Oriented x 3. pleasant and cooperative. General Notes: Patient's wound bed currently did require some mild sharp agreement clear way the necrotic tissue and leftover EpiFix from the surface of the wound once this will gently debrided away other than reapply the EpiFix secondarily with the new piece today and  this was secured with [employer] and Steri-Strips. Integumentary (Hair, Skin) Wound #1 status is Open. Original cause of wound was Bump. The wound is located on the Right,Lateral Lower Leg. The wound measures 2cm length x 1.7cm width x 0.2cm depth; 2.67cm^2 area and 0.534cm^3 volume. There is Fat Layer (Subcutaneous Tissue) Exposed exposed. There is no tunneling or undermining noted. There is a medium amount of purulent drainage noted. The wound margin is flat and intact. There is no granulation within the wound bed. There is a large (67-100%) amount of necrotic tissue within the wound bed including Eschar and Adherent Slough. The periwound skin appearance did not exhibit: Callus, Crepitus, Excoriation, Induration, Rash, Scarring, Dry/Scaly, Maceration, Atrophie Blanche, Cyanosis, Ecchymosis, Hemosiderin Staining, Mottled, Pallor, Rubor, Erythema. Periwound temperature was noted as No Abnormality. The periwound has tenderness on  palpation. 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 There was a Excisional Skin/Subcutaneous Tissue Debridement with a total area of 3.4 sq cm performed by STONE III, HOYT E., PA-C. With the following instrument(s): Curette to remove Viable and Non-Viable tissue/material. Material removed includes Subcutaneous Tissue and Slough and after achieving pain control using Lidocaine 4% Topical Solution. No specimens were taken. A time out was conducted at 10:50, prior to the start of the procedure. A Minimum amount of bleeding was controlled with Pressure. The procedure was tolerated well with a pain level of 0 throughout and a pain level of 0 following the procedure. Post Debridement Measurements: 2cm length x 1.7cm width x 0.3cm depth; 0.801cm^3 volume. Character of Wound/Ulcer Post Debridement is improved. 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 A skin graft procedure using a bioengineered skin substitute/cellular or tissue based product was performed by STONE III, HOYT E., PA-C with the following instrument(s): Forceps and Scissors. Epifix was applied and secured with Steri-Strips. 6 sq cm of product was utilized and 0 sq cm was wasted. Post Application, mepitel one was applied. A Time Out was conducted at 10:56, prior to the start of the procedure. The procedure was tolerated well with a pain level of 0 throughout and a pain level of 0 following the procedure. Post procedure Diagnosis Wound #1: Same as  Pre-Procedure . Plan Wound Cleansing: Wound #1 Right,Lateral Lower Leg: Clean wound with Normal Saline. May shower with protection. - Please do not get your wrap wet Anesthetic (add to Medication List): Wound #1 Right,Lateral Lower Leg: Topical Lidocaine 4% cream applied to wound bed prior to debridement (In Clinic Only). Benzocaine Topical Anesthetic Spray applied to wound bed prior to debridement (In Clinic Only). Skin Barriers/Peri-Wound Care: Triamcinolone Acetonide Ointment (TCA) - to right lower leg Primary Wound Dressing: Wound #1 Right,Lateral Lower Leg: Other: - Epifix Secondary Dressing: Wound #1 Right,Lateral Lower Leg: ABD pad Other - carboflex Dressing Change Frequency: Wound #1 Right,Lateral Lower Leg: Change dressing every week Other: - if needed Follow-up Appointments: Wound #1 Right,Lateral Lower Leg: Return Appointment in 1 week. Edema Control: Wound #1 Right,Lateral Lower Leg: [name], [name] ([id number]) 3 Layer Compression System - Right Lower Extremity - unna to anchor - use Kerlix rather than cotton layer Elevate legs to the level of  the heart and pump ankles as often as possible Advanced Therapies: Wound #1 Right,Lateral Lower Leg: EpiFix application in clinic; including contact layer, fixation with steri strips, dry gauze and cover dressing. I'm in a recommend that we go ahead and continue with the above wound care measures for the next week and the patient is in agreement with plan. We will subsequently see were things stand at follow-up. Anything changes worsens in the meantime she will contact the office and let me know. Please see above for specific wound care orders. We will see patient for re-evaluation in 1 week(s) here in the clinic. If anything worsens or changes patient will contact our office for additional recommendations. Electronic Signature(s) Signed: 10/30/2018 1:14:32 PM By: Worthy Keeler PA-C Entered By: Worthy Keeler on  10/29/2018 [place], [name] ([id number]) -------------------------------------------------------------------------------- ROS/PFSH Details Patient Name: [name], [name]. Date of Service: 10/29/2018 10:00 AM Medical Record Number: [id number] Patient Account Number: [id number] Date of Birth/Sex: 1951-01-04 (68 y.o. F) Treating RN: Montey Hora Primary Care Provider: Crist Infante Other Clinician: Referring Provider: Crist Infante Treating Provider/Extender: Melburn Hake, HOYT Weeks in Treatment: 6 Information Obtained From Patient Wound History Do you currently have one or more open woundso Yes How many open wounds do you currently haveo 1 Approximately how long have you had your woundso 6 weeks How have you been treating your wound(s) until nowo unna boots Has your wound(s) ever healed and then re-openedo No Have you had any lab work done in the past montho No Have you tested positive for an antibiotic resistant organism (MRSA, VRE)o No Have you tested positive for osteomyelitis (bone infection)o No Have you had any tests for circulation on your legso Yes Who ordered the testo [facility] Where was the test doneo 2015 Constitutional Symptoms (General Health) Complaints and Symptoms: Negative for: Fever; Chills Eyes Medical History: Negative for: Cataracts; Glaucoma; Optic Neuritis Ear/Nose/Mouth/Throat Medical History: Negative for: Chronic sinus problems/congestion; Middle ear problems Hematologic/Lymphatic Medical History: Negative for: Anemia; Hemophilia; Human Immunodeficiency Virus; Sickle Cell Disease Respiratory Complaints and Symptoms: No Complaints or Symptoms Medical History: Negative for: Aspiration; Asthma; Chronic Obstructive Pulmonary Disease (COPD); Pneumothorax; Sleep Apnea; Tuberculosis Cardiovascular Complaints and Symptoms: No Complaints or Symptoms JELISA, Killen ([id number]) Medical History: Positive for: Arrhythmia - a fib; Deep Vein Thrombosis - 13  years ago; Hypertension Negative for: Angina; Congestive Heart Failure; Coronary Artery Disease; Hypotension; Myocardial Infarction; Peripheral Arterial Disease; Peripheral Venous Disease; Phlebitis; Vasculitis Gastrointestinal Medical History: Negative for: Cirrhosis ; Colitis; Crohnos; Hepatitis A; Hepatitis B; Hepatitis C Endocrine Medical History: Negative for: Type I Diabetes; Type II Diabetes Genitourinary Medical History: Negative for: End Stage Renal Disease Immunological Medical History: Negative for: Lupus Erythematosus; Raynaudos; Scleroderma Past Medical History Notes: Wegeners Integumentary (Skin) Medical History: Negative for: History of Burn; History of pressure wounds Musculoskeletal Medical History: Positive for: Osteoarthritis Negative for: Gout; Rheumatoid Arthritis; Osteomyelitis Past Medical History Notes: OP Neurologic Medical History: Negative for: Dementia; Neuropathy; Quadriplegia; Paraplegia; Seizure Disorder Oncologic Medical History: Positive for: Received Chemotherapy - past for Wegeners Negative for: Received Radiation Psychiatric Complaints and Symptoms: No Complaints or Symptoms Medical History: Negative for: Anorexia/bulimia; Confinement Anxiety [name], [name] ([id number]) Immunizations Pneumococcal Vaccine: Received Pneumococcal Vaccination: Yes Implantable Devices No devices added Family and Social History Cancer: Yes - Father; Diabetes: Yes - Maternal Grandparents; Heart Disease: No; Hereditary Spherocytosis: No; Hypertension: No; Kidney Disease: No; Lung Disease: Yes - Father; Seizures: No; Stroke: Yes - Father; Thyroid Problems: No; Tuberculosis: No; Never smoker; Marital  Status - Married; Alcohol Use: Never; Drug Use: No History; Caffeine Use: Daily; Financial Concerns: No; Food, Clothing or Shelter Needs: No; Support System Lacking: No; Transportation Concerns: No; Advanced Directives: Yes (Not Provided); Patient does not want  information on Advanced Directives; Medical Power of Attorney: Yes - sister - Pessy Delamar (Not Provided) Physician Affirmation I have reviewed and agree with the above information. Electronic Signature(s) Signed: 10/30/2018 1:14:32 PM By: Worthy Keeler PA-C Signed: 10/30/2018 4:23:17 PM By: Montey Hora Entered By: Worthy Keeler on 10/29/2018 11:06:13 [name] ([id number]) -------------------------------------------------------------------------------- SuperBill Details Patient Name: BENNA, ARNO. Date of Service: 10/29/2018 Medical Record Number: [id number] Patient Account Number: [id number] Date of Birth/Sex: 1951-01-29 (68 y.o. 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 I48.0 Paroxysmal atrial fibrillation I10 Essential (primary) hypertension Z79.01 Long term (current) use of anticoagulants Facility Procedures CPT4 Code Description: [id number] Q4186 o Epifix o 2 x 3 Sq Cm Modifier: Quantity: 6 CPT4 Code Description: 46803212 15271 - SKIN SUB GRAFT TRNK/ARM/LEG ICD-10 Diagnosis Description 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 wit I48.0 Paroxysmal atrial  fibrillation I10 Essential (primary) hypertension Modifier: h fat layer expo Quantity: 1 sed Physician Procedures CPT4 Code Description: 2482500 37048 - WC PHYS SKIN SUB GRAFT TRNK/ARM/LEG ICD-10 Diagnosis Description 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

## 2018-11-03 ENCOUNTER — Other Ambulatory Visit: Payer: Self-pay

## 2018-11-03 ENCOUNTER — Emergency Department (HOSPITAL_COMMUNITY): Payer: Medicare Other

## 2018-11-03 ENCOUNTER — Encounter (HOSPITAL_COMMUNITY): Payer: Self-pay | Admitting: Emergency Medicine

## 2018-11-03 ENCOUNTER — Inpatient Hospital Stay (HOSPITAL_COMMUNITY)
Admission: EM | Admit: 2018-11-03 | Discharge: 2018-11-05 | DRG: 065 | Disposition: A | Discharge status: Rehabilitation Facility | Payer: Medicare Other | Attending: Internal Medicine | Admitting: Internal Medicine

## 2018-11-03 DIAGNOSIS — I872 Venous insufficiency (chronic) (peripheral): Secondary | ICD-10-CM | POA: Diagnosis present

## 2018-11-03 DIAGNOSIS — R29810 Facial weakness: Secondary | ICD-10-CM | POA: Diagnosis present

## 2018-11-03 DIAGNOSIS — I69351 Hemiplegia and hemiparesis following cerebral infarction affecting right dominant side: Secondary | ICD-10-CM | POA: Diagnosis not present

## 2018-11-03 DIAGNOSIS — I10 Essential (primary) hypertension: Secondary | ICD-10-CM | POA: Diagnosis not present

## 2018-11-03 DIAGNOSIS — L97919 Non-pressure chronic ulcer of unspecified part of right lower leg with unspecified severity: Secondary | ICD-10-CM | POA: Diagnosis present

## 2018-11-03 DIAGNOSIS — M313 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: Secondary | ICD-10-CM

## 2018-11-03 DIAGNOSIS — K219 Gastro-esophageal reflux disease without esophagitis: Secondary | ICD-10-CM | POA: Diagnosis present

## 2018-11-03 DIAGNOSIS — F419 Anxiety disorder, unspecified: Secondary | ICD-10-CM | POA: Diagnosis present

## 2018-11-03 DIAGNOSIS — I69328 Other speech and language deficits following cerebral infarction: Secondary | ICD-10-CM | POA: Diagnosis not present

## 2018-11-03 DIAGNOSIS — R29700 NIHSS score 0: Secondary | ICD-10-CM | POA: Diagnosis present

## 2018-11-03 DIAGNOSIS — G4733 Obstructive sleep apnea (adult) (pediatric): Secondary | ICD-10-CM | POA: Diagnosis present

## 2018-11-03 DIAGNOSIS — E785 Hyperlipidemia, unspecified: Secondary | ICD-10-CM | POA: Diagnosis present

## 2018-11-03 DIAGNOSIS — I129 Hypertensive chronic kidney disease with stage 1 through stage 4 chronic kidney disease, or unspecified chronic kidney disease: Secondary | ICD-10-CM | POA: Diagnosis present

## 2018-11-03 DIAGNOSIS — Z823 Family history of stroke: Secondary | ICD-10-CM

## 2018-11-03 DIAGNOSIS — Z7982 Long term (current) use of aspirin: Secondary | ICD-10-CM | POA: Diagnosis not present

## 2018-11-03 DIAGNOSIS — K5900 Constipation, unspecified: Secondary | ICD-10-CM | POA: Diagnosis present

## 2018-11-03 DIAGNOSIS — M300 Polyarteritis nodosa: Secondary | ICD-10-CM | POA: Diagnosis present

## 2018-11-03 DIAGNOSIS — N184 Chronic kidney disease, stage 4 (severe): Secondary | ICD-10-CM | POA: Diagnosis present

## 2018-11-03 DIAGNOSIS — Z833 Family history of diabetes mellitus: Secondary | ICD-10-CM

## 2018-11-03 DIAGNOSIS — Z8249 Family history of ischemic heart disease and other diseases of the circulatory system: Secondary | ICD-10-CM | POA: Diagnosis not present

## 2018-11-03 DIAGNOSIS — E876 Hypokalemia: Secondary | ICD-10-CM | POA: Diagnosis present

## 2018-11-03 DIAGNOSIS — R531 Weakness: Secondary | ICD-10-CM

## 2018-11-03 DIAGNOSIS — Z86718 Personal history of other venous thrombosis and embolism: Secondary | ICD-10-CM

## 2018-11-03 DIAGNOSIS — I639 Cerebral infarction, unspecified: Principal | ICD-10-CM | POA: Diagnosis present

## 2018-11-03 DIAGNOSIS — Z96642 Presence of left artificial hip joint: Secondary | ICD-10-CM | POA: Diagnosis present

## 2018-11-03 DIAGNOSIS — F329 Major depressive disorder, single episode, unspecified: Secondary | ICD-10-CM | POA: Diagnosis present

## 2018-11-03 DIAGNOSIS — E669 Obesity, unspecified: Secondary | ICD-10-CM | POA: Diagnosis present

## 2018-11-03 DIAGNOSIS — G629 Polyneuropathy, unspecified: Secondary | ICD-10-CM | POA: Diagnosis present

## 2018-11-03 DIAGNOSIS — Z7952 Long term (current) use of systemic steroids: Secondary | ICD-10-CM

## 2018-11-03 DIAGNOSIS — G4700 Insomnia, unspecified: Secondary | ICD-10-CM | POA: Diagnosis present

## 2018-11-03 DIAGNOSIS — I776 Arteritis, unspecified: Secondary | ICD-10-CM | POA: Diagnosis present

## 2018-11-03 DIAGNOSIS — L97909 Non-pressure chronic ulcer of unspecified part of unspecified lower leg with unspecified severity: Secondary | ICD-10-CM | POA: Diagnosis present

## 2018-11-03 DIAGNOSIS — Z8261 Family history of arthritis: Secondary | ICD-10-CM

## 2018-11-03 DIAGNOSIS — G8191 Hemiplegia, unspecified affecting right dominant side: Secondary | ICD-10-CM | POA: Diagnosis present

## 2018-11-03 DIAGNOSIS — Z86711 Personal history of pulmonary embolism: Secondary | ICD-10-CM

## 2018-11-03 DIAGNOSIS — Z79891 Long term (current) use of opiate analgesic: Secondary | ICD-10-CM

## 2018-11-03 DIAGNOSIS — R0902 Hypoxemia: Secondary | ICD-10-CM | POA: Diagnosis not present

## 2018-11-03 DIAGNOSIS — Z7901 Long term (current) use of anticoagulants: Secondary | ICD-10-CM

## 2018-11-03 DIAGNOSIS — I6381 Other cerebral infarction due to occlusion or stenosis of small artery: Secondary | ICD-10-CM | POA: Diagnosis not present

## 2018-11-03 DIAGNOSIS — Z8679 Personal history of other diseases of the circulatory system: Secondary | ICD-10-CM | POA: Diagnosis not present

## 2018-11-03 DIAGNOSIS — Z6832 Body mass index (BMI) 32.0-32.9, adult: Secondary | ICD-10-CM

## 2018-11-03 DIAGNOSIS — D649 Anemia, unspecified: Secondary | ICD-10-CM | POA: Diagnosis present

## 2018-11-03 DIAGNOSIS — Z79899 Other long term (current) drug therapy: Secondary | ICD-10-CM | POA: Diagnosis not present

## 2018-11-03 DIAGNOSIS — Z801 Family history of malignant neoplasm of trachea, bronchus and lung: Secondary | ICD-10-CM

## 2018-11-03 DIAGNOSIS — I69392 Facial weakness following cerebral infarction: Secondary | ICD-10-CM | POA: Diagnosis not present

## 2018-11-03 LAB — I-STAT CREATININE, ED: Creatinine, Ser: 2.2 mg/dL — ABNORMAL HIGH (ref 0.44–1.00)

## 2018-11-03 LAB — RAPID URINE DRUG SCREEN, HOSP PERFORMED
Amphetamines: NOT DETECTED
Barbiturates: NOT DETECTED
Benzodiazepines: NOT DETECTED
Cocaine: NOT DETECTED
Opiates: NOT DETECTED
Tetrahydrocannabinol: NOT DETECTED

## 2018-11-03 LAB — DIFFERENTIAL
Abs Immature Granulocytes: 0.02 10*3/uL (ref 0.00–0.07)
Basophils Absolute: 0.1 10*3/uL (ref 0.0–0.1)
Basophils Relative: 1 %
Eosinophils Absolute: 0 10*3/uL (ref 0.0–0.5)
Eosinophils Relative: 0 %
Immature Granulocytes: 0 %
Lymphocytes Relative: 7 %
Lymphs Abs: 0.5 10*3/uL — ABNORMAL LOW (ref 0.7–4.0)
Monocytes Absolute: 0.5 10*3/uL (ref 0.1–1.0)
Monocytes Relative: 7 %
Neutro Abs: 6 10*3/uL (ref 1.7–7.7)
Neutrophils Relative %: 85 %

## 2018-11-03 LAB — URINALYSIS, ROUTINE W REFLEX MICROSCOPIC
Bilirubin Urine: NEGATIVE
Glucose, UA: NEGATIVE mg/dL
Hgb urine dipstick: NEGATIVE
Ketones, ur: NEGATIVE mg/dL
Leukocytes,Ua: NEGATIVE
Nitrite: NEGATIVE
Protein, ur: NEGATIVE mg/dL
Specific Gravity, Urine: 1.014 (ref 1.005–1.030)
pH: 9 — ABNORMAL HIGH (ref 5.0–8.0)

## 2018-11-03 LAB — COMPREHENSIVE METABOLIC PANEL
ALT: 12 U/L (ref 0–44)
AST: 23 U/L (ref 15–41)
Albumin: 3.6 g/dL (ref 3.5–5.0)
Alkaline Phosphatase: 76 U/L (ref 38–126)
Anion gap: 10 (ref 5–15)
BUN: 24 mg/dL — ABNORMAL HIGH (ref 8–23)
CO2: 30 mmol/L (ref 22–32)
Calcium: 9.9 mg/dL (ref 8.9–10.3)
Chloride: 103 mmol/L (ref 98–111)
Creatinine, Ser: 0.84 mg/dL (ref 0.44–1.00)
GFR calc Af Amer: >60 mL/min (ref >60)
GFR calc non Af Amer: >60 mL/min (ref >60)
Glucose, Bld: 95 mg/dL (ref 70–99)
Potassium: 4.1 mmol/L (ref 3.5–5.1)
Sodium: 143 mmol/L (ref 135–145)
Total Bilirubin: 0.3 mg/dL (ref 0.3–1.2)
Total Protein: 7.2 g/dL (ref 6.5–8.1)

## 2018-11-03 LAB — CBC
HCT: 31.5 % — ABNORMAL LOW (ref 36.0–46.0)
Hemoglobin: 9.7 g/dL — ABNORMAL LOW (ref 12.0–15.0)
MCH: 35.8 pg — ABNORMAL HIGH (ref 26.0–34.0)
MCHC: 30.8 g/dL (ref 30.0–36.0)
MCV: 116.2 fL — ABNORMAL HIGH (ref 80.0–100.0)
Platelets: 522 10*3/uL — ABNORMAL HIGH (ref 150–400)
RBC: 2.71 MIL/uL — ABNORMAL LOW (ref 3.87–5.11)
RDW: 16.5 % — ABNORMAL HIGH (ref 11.5–15.5)
WBC: 7 10*3/uL (ref 4.0–10.5)
nRBC: 0 % (ref 0.0–0.2)

## 2018-11-03 LAB — PROTIME-INR
INR: 1 (ref 0.8–1.2)
Prothrombin Time: 13.4 seconds (ref 11.4–15.2)

## 2018-11-03 LAB — APTT: aPTT: 31 seconds (ref 24–36)

## 2018-11-03 LAB — ETHANOL: Alcohol, Ethyl (B): <10 mg/dL (ref <10)

## 2018-11-03 MED ORDER — SENNOSIDES-DOCUSATE SODIUM 8.6-50 MG PO TABS: 1.0000 | ORAL_TABLET | Freq: Every evening | ORAL | Status: DC | PRN

## 2018-11-03 MED ORDER — SODIUM CHLORIDE 0.9 % IV SOLN
INTRAVENOUS | Status: DC
  Administered 2018-11-03 – 2018-11-05 (×2): via INTRAVENOUS

## 2018-11-03 MED ORDER — HYDROXYUREA 500 MG PO CAPS: 500.0000 mg | ORAL_CAPSULE | ORAL | Status: DC

## 2018-11-03 MED ORDER — GABAPENTIN 300 MG PO CAPS
600.0000 mg | ORAL_CAPSULE | Freq: Two times a day (BID) | ORAL | Status: DC
  Administered 2018-11-04 – 2018-11-05 (×4): 600 mg via ORAL
  Filled 2018-11-03 (×4): qty 2

## 2018-11-03 MED ORDER — LISINOPRIL 10 MG PO TABS
10.0000 mg | ORAL_TABLET | Freq: Every day | ORAL | Status: DC
  Administered 2018-11-04 – 2018-11-05 (×2): 10 mg via ORAL
  Filled 2018-11-03 (×2): qty 1

## 2018-11-03 MED ORDER — APIXABAN 2.5 MG PO TABS
2.5000 mg | ORAL_TABLET | Freq: Two times a day (BID) | ORAL | Status: DC
  Administered 2018-11-04 – 2018-11-05 (×4): 2.5 mg via ORAL
  Filled 2018-11-03 (×4): qty 1

## 2018-11-03 MED ORDER — ATORVASTATIN CALCIUM 40 MG PO TABS
40.0000 mg | ORAL_TABLET | Freq: Every day | ORAL | Status: DC
  Administered 2018-11-04: 40 mg via ORAL
  Filled 2018-11-03 (×2): qty 1

## 2018-11-03 MED ORDER — HYDROXYUREA 500 MG PO CAPS
1000.0000 mg | ORAL_CAPSULE | Freq: Every day | ORAL | Status: DC
  Administered 2018-11-04 – 2018-11-05 (×2): 1000 mg via ORAL
  Filled 2018-11-03 (×2): qty 2

## 2018-11-03 MED ORDER — ACETAMINOPHEN 650 MG RE SUPP: 650.0000 mg | RECTAL | Status: DC | PRN

## 2018-11-03 MED ORDER — OXYCODONE-ACETAMINOPHEN 5-325 MG PO TABS: 1.0000 | ORAL_TABLET | Freq: Four times a day (QID) | ORAL | Status: DC | PRN

## 2018-11-03 MED ORDER — VITAMIN D 25 MCG (1000 UNIT) PO TABS
1000.0000 [IU] | ORAL_TABLET | Freq: Two times a day (BID) | ORAL | Status: DC
  Administered 2018-11-04 – 2018-11-05 (×4): 1000 [IU] via ORAL
  Filled 2018-11-03 (×4): qty 1

## 2018-11-03 MED ORDER — ASPIRIN EC 325 MG PO TBEC
325.0000 mg | DELAYED_RELEASE_TABLET | Freq: Every day | ORAL | Status: DC
  Administered 2018-11-03 – 2018-11-04 (×2): 325 mg via ORAL
  Filled 2018-11-03 (×2): qty 1

## 2018-11-03 MED ORDER — PREDNISONE 5 MG PO TABS
5.0000 mg | ORAL_TABLET | Freq: Every day | ORAL | Status: DC
  Administered 2018-11-04 – 2018-11-05 (×2): 5 mg via ORAL
  Filled 2018-11-03 (×2): qty 1

## 2018-11-03 MED ORDER — LORAZEPAM 0.5 MG PO TABS
0.5000 mg | ORAL_TABLET | Freq: Every day | ORAL | Status: DC | PRN
  Administered 2018-11-04: 0.5 mg via ORAL
  Filled 2018-11-03: qty 1

## 2018-11-03 MED ORDER — ADULT MULTIVITAMIN W/MINERALS CH
1.0000 | ORAL_TABLET | Freq: Every day | ORAL | Status: DC
  Administered 2018-11-04 – 2018-11-05 (×2): 1 via ORAL
  Filled 2018-11-03 (×2): qty 1

## 2018-11-03 MED ORDER — CALCIUM CARBONATE 1250 (500 CA) MG PO TABS
1250.0000 mg | ORAL_TABLET | Freq: Every day | ORAL | Status: DC
  Administered 2018-11-04 – 2018-11-05 (×2): 1250 mg via ORAL
  Filled 2018-11-03 (×2): qty 1

## 2018-11-03 MED ORDER — STROKE: EARLY STAGES OF RECOVERY BOOK
Freq: Once | Status: AC
  Administered 2018-11-03: 21:00:00

## 2018-11-03 MED ORDER — AZATHIOPRINE 50 MG PO TABS
100.0000 mg | ORAL_TABLET | Freq: Every day | ORAL | Status: DC
  Administered 2018-11-04 – 2018-11-05 (×2): 100 mg via ORAL
  Filled 2018-11-03 (×2): qty 2

## 2018-11-03 MED ORDER — DULOXETINE HCL 60 MG PO CPEP
60.0000 mg | ORAL_CAPSULE | Freq: Every day | ORAL | Status: DC
  Administered 2018-11-04 (×2): 60 mg via ORAL
  Filled 2018-11-03 (×2): qty 1

## 2018-11-03 MED ORDER — HEPARIN SODIUM (PORCINE) 5000 UNIT/ML IJ SOLN
5000.0000 [IU] | Freq: Three times a day (TID) | INTRAMUSCULAR | Status: DC
  Administered 2018-11-03: 5000 [IU] via SUBCUTANEOUS
  Filled 2018-11-03: qty 1

## 2018-11-03 MED ORDER — ACETAMINOPHEN 160 MG/5ML PO SOLN: 650.0000 mg | ORAL | Status: DC | PRN

## 2018-11-03 MED ORDER — HYDROXYUREA 500 MG PO CAPS
500.0000 mg | ORAL_CAPSULE | Freq: Every day | ORAL | Status: DC
  Administered 2018-11-04 (×2): 500 mg via ORAL
  Filled 2018-11-03 (×2): qty 1

## 2018-11-03 MED ORDER — ACETAMINOPHEN 325 MG PO TABS
650.0000 mg | ORAL_TABLET | ORAL | Status: DC | PRN
  Administered 2018-11-03 – 2018-11-05 (×3): 650 mg via ORAL
  Filled 2018-11-03 (×3): qty 2

## 2018-11-03 NOTE — ED Triage Notes (Signed)
Pt arrives via EMS from home as Code Stroke. LSN 1300. Pt reports sitting at the table and went to get up and was not able to. Pt noticed right sided weakness in arm and leg and right sided facial droop. Pt A&Ox4.

## 2018-11-03 NOTE — ED Notes (Signed)
Report given to 3W nurse. Receiving nurse stated that the room is still being cleaned and asked for pt to be brought up in 5-10 minutes.

## 2018-11-03 NOTE — Progress Notes (Signed)
Received the pt from ED, alert and oriented, accompanied by her family at the bedside, placed on Tele and initiated MD orders

## 2018-11-03 NOTE — ED Notes (Signed)
ED TO INPATIENT HANDOFF REPORT  ED Nurse Name and Phone #: [phone]  S Name/Age/Gender [name] 68 y.o. female Room/Bed: 015C/015C  Code Status   Code Status: Not on file  Home/SNF/Other Home Patient oriented to: self, place, time and situation Is this baseline? Yes   Triage Complete: Triage complete  Chief Complaint Code Stroke  Triage Note Pt arrives via EMS from home as Code Stroke. LSN 1300. Pt reports sitting at the table and went to get up and was not able to. Pt noticed right sided weakness in arm and leg and right sided facial droop. Pt A&Ox4.    Allergies Allergies  Allergen Reactions  . Latex     Sensitive to latex, rash  . Other     Adhesive tape causes rash  . Pregabalin Swelling    Swelling of hands and feet  . Codeine     REACTION: itching    Level of Care/Admitting Diagnosis ED Disposition    ED Disposition Condition [facility] Area: [place] [100100]  Level of Care: Telemetry Medical [104]  Diagnosis: Acute CVA (cerebrovascular accident) [facility]) [[id number]]  Admitting Physician: Elwyn Reach [[id number]]  Attending Physician: Elwyn Reach [[id number]]  Estimated length of stay: past midnight tomorrow  Certification:: I certify this patient will need inpatient services for at least 2 midnights  PT Class (Do Not Modify): Inpatient [101]  PT Acc Code (Do Not Modify): Private [1]       B Medical/Surgery History Past Medical History:  Diagnosis Date  . Allergic rhinitis   . Anemia    recurrent iron defic.  Marland Kitchen Cervical polyp 03/2003  . DDD (degenerative disc disease)   . Depression    Dr. Toy Care (situational, divorce, loss of parents)  . Diverticulosis   . DVT (deep venous thrombosis) (HCC)    both legs  . Esophageal stricture   . GERD (gastroesophageal reflux disease)   . Hiatal hernia   . Hyperlipidemia   . Hypertension   . Hypertension   . Neuropathy, peripheral   . OSA (obstructive sleep  apnea)   . Osteoarthritis   . Pulmonary embolism ([place])    left lung  . Wegner's disease (congenital syphilitic osteochondritis) 2012   DR. Ginger Organ at [facility]   Past Surgical History:  Procedure Laterality Date  . back injection     steroid injection x2  . BRONCHOSCOPY  april 2012  . iron infusion  11/14   [facility] hematologist at [facility]  . lung biospy  may 2012  . PARTIAL HIP ARTHROPLASTY Left 03/2014     A IV Location/Drains/Wounds Patient Lines/Drains/Airways Status   Active Line/Drains/Airways    None          Intake/Output Last 24 hours No intake or output data in the 24 hours ending 11/03/18 1847  Labs/Imaging Results for orders placed or performed during the hospital encounter of 11/03/18 (from the past 48 hour(s))  Protime-INR     Status: None   Collection Time: 11/03/18  4:00 PM  Result Value Ref Range   Prothrombin Time 13.4 11.4 - 15.2 seconds   INR 1.0 0.8 - 1.2    Comment: (NOTE) INR goal varies based on device and disease states. Performed at [facility], [place] [address] [place], [place] [address]   APTT     Status: None   Collection Time: 11/03/18  4:00 PM  Result Value Ref Range   aPTT 31 24 -  36 seconds    Comment: Performed at [facility], [place] [address] [place], [place] [address]  CBC     Status: Abnormal   Collection Time: 11/03/18  4:00 PM  Result Value Ref Range   WBC 7.0 4.0 - 10.5 K/uL   RBC 2.71 (L) 3.87 - 5.11 MIL/uL   Hemoglobin 9.7 (L) 12.0 - 15.0 g/dL   HCT 31.5 (L) 36.0 - 46.0 %   MCV 116.2 (H) 80.0 - 100.0 fL   MCH 35.8 (H) 26.0 - 34.0 pg   MCHC 30.8 30.0 - 36.0 g/dL   RDW 16.5 (H) 11.5 - 15.5 %   Platelets 522 (H) 150 - 400 K/uL   nRBC 0.0 0.0 - 0.2 %    Comment: Performed at [facility], [place] [address] [place], [place] [address]  Differential     Status: Abnormal   Collection Time: 11/03/18  4:00 PM  Result Value Ref Range   Neutrophils Relative % 85 %   Neutro Abs 6.0 1.7 - 7.7 K/uL   Lymphocytes  Relative 7 %   Lymphs Abs 0.5 (L) 0.7 - 4.0 K/uL   Monocytes Relative 7 %   Monocytes Absolute 0.5 0.1 - 1.0 K/uL   Eosinophils Relative 0 %   Eosinophils Absolute 0.0 0.0 - 0.5 K/uL   Basophils Relative 1 %   Basophils Absolute 0.1 0.0 - 0.1 K/uL   Immature Granulocytes 0 %   Abs Immature Granulocytes 0.02 0.00 - 0.07 K/uL    Comment: Performed at [facility] [address], [place], [place] [address]  Comprehensive metabolic panel     Status: Abnormal   Collection Time: 11/03/18  4:00 PM  Result Value Ref Range   Sodium 143 135 - 145 mmol/L   Potassium 4.1 3.5 - 5.1 mmol/L   Chloride 103 98 - 111 mmol/L   CO2 30 22 - 32 mmol/L   Glucose, Bld 95 70 - 99 mg/dL   BUN 24 (H) 8 - 23 mg/dL   Creatinine, Ser 0.84 0.44 - 1.00 mg/dL   Calcium 9.9 8.9 - 10.3 mg/dL   Total Protein 7.2 6.5 - 8.1 g/dL   Albumin 3.6 3.5 - 5.0 g/dL   AST 23 15 - 41 U/L   ALT 12 0 - 44 U/L   Alkaline Phosphatase 76 38 - 126 U/L   Total Bilirubin 0.3 0.3 - 1.2 mg/dL   GFR calc non Af Amer >60 >60 mL/min   GFR calc Af Amer >60 >60 mL/min   Anion gap 10 5 - 15    Comment: Performed at [facility] [address], [place], [place] [address]  I-stat Creatinine, ED     Status: Abnormal   Collection Time: 11/03/18  4:06 PM  Result Value Ref Range   Creatinine, Ser 2.20 (H) 0.44 - 1.00 mg/dL  Ethanol     Status: None   Collection Time: 11/03/18  4:30 PM  Result Value Ref Range   Alcohol, Ethyl (B) <10 <10 mg/dL    Comment: (NOTE) Lowest detectable limit for serum alcohol is 10 mg/dL. For medical purposes only. Performed at [facility], [place] [address], [place], [place] [address]   Urine rapid drug screen (hosp performed)     Status: None   Collection Time: 11/03/18  5:51 PM  Result Value Ref Range   Opiates NONE DETECTED NONE DETECTED   Cocaine NONE DETECTED NONE DETECTED   Benzodiazepines NONE DETECTED  NONE DETECTED   Amphetamines NONE DETECTED NONE DETECTED   Tetrahydrocannabinol  NONE DETECTED NONE DETECTED   Barbiturates NONE DETECTED NONE DETECTED    Comment: (NOTE) DRUG SCREEN FOR MEDICAL PURPOSES ONLY.  IF CONFIRMATION IS NEEDED FOR ANY PURPOSE, NOTIFY LAB WITHIN 5 DAYS. LOWEST DETECTABLE LIMITS FOR URINE DRUG SCREEN Drug Class                     Cutoff (ng/mL) Amphetamine and metabolites    1000 Barbiturate and metabolites    200 Benzodiazepine                 606 Tricyclics and metabolites     300 Opiates and metabolites        300 Cocaine and metabolites        300 THC                            50 Performed at [facility], [place] [address] [place], [place] [address]   Urinalysis, Routine w reflex microscopic     Status: Abnormal   Collection Time: 11/03/18  5:51 PM  Result Value Ref Range   Color, Urine YELLOW YELLOW   APPearance CLOUDY (A) CLEAR   Specific Gravity, Urine 1.014 1.005 - 1.030   pH 9.0 (H) 5.0 - 8.0   Glucose, UA NEGATIVE NEGATIVE mg/dL   Hgb urine dipstick NEGATIVE NEGATIVE   Bilirubin Urine NEGATIVE NEGATIVE   Ketones, ur NEGATIVE NEGATIVE mg/dL   Protein, ur NEGATIVE NEGATIVE mg/dL   Nitrite NEGATIVE NEGATIVE   Leukocytes,Ua NEGATIVE NEGATIVE    Comment: Performed at [facility] [address] [place], [place] [address]   Mr Jodene Nam Head Wo Contrast  Result Date: 11/03/2018 CLINICAL DATA:  68 year old female code stroke with right side weakness and questionable hyperdense left MCA on plain head CT today. EXAM: MRI HEAD WITHOUT CONTRAST LIMITED MRA HEAD WITHOUT CONTRAST TECHNIQUE: Limited pulse sequences of the brain and surrounding structures were obtained without intravenous contrast. Angiographic images of the head were obtained using MRA technique without contrast. COMPARISON:  Head CT 1606 hours today . FINDINGS: MRI HEAD FINDINGS DWI and axial FLAIR imaging only was obtained due to acute code stroke presentation. These reveal a small area of restricted diffusion in the posterior left corona radiata and left  posterior lentiform along the margins of a chronic lacunar infarct (series 3, images 37 and 38). There is only mild associated FLAIR hyperintensity limited to the lentiform. No other No restricted diffusion or evidence of acute infarction. No midline shift, mass effect, or evidence of intracranial mass lesion. No evidence of intracranial hemorrhage. Scattered additional bilateral cerebral white matter FLAIR hyperintensity. MRA HEAD FINDINGS Antegrade flow in the posterior circulation with mildly dominant distal left vertebral artery. Highly tortuous vertebrobasilar junction but no distal vertebral stenosis. Both PICA origins remain normal. Tortuous basilar artery without stenosis. Normal SCA origins. Fetal type left PCA origin. Normal right PCA origin and posterior communicating artery. Bilateral PCA branches are within normal limits. Antegrade flow in both ICA siphons. Tortuous cervical right ICA just below the skull base. Mild siphon dolichoectasia without stenosis. Ophthalmic and posterior communicating artery origins appear normal. Patent carotid termini. Normal MCA and ACA origins. Tortuous proximal ACAs. Anterior communicating artery is within normal limits. Visible ACA branches are within normal limits. Right MCA M1, bifurcation, and visible right MCA branches are within normal limits. Left MCA M1 segment  is patent to the bifurcation without stenosis. Left MCA bifurcation and visible left MCA branches are within normal limits. IMPRESSION: 1. Negative for large vessel occlusion. 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 2. Positive for acute small vessel infarct along the posterior margins of the chronic lacune of the left corona radiata and lentiform. No evidence of associated hemorrhage or mass effect. These results were communicated to Dr. Cheral Marker at 6:08 pmon 3/15/2020by text page via the [facility] messaging system. Electronically Signed   By: Genevie Ann M.D.   On: 11/03/2018 18:08   Mr Brain Wo  Contrast  Result Date: 11/03/2018 CLINICAL DATA:  68 year old female code stroke with right side weakness and questionable hyperdense left MCA on plain head CT today. EXAM: MRI HEAD WITHOUT CONTRAST LIMITED MRA HEAD WITHOUT CONTRAST TECHNIQUE: Limited pulse sequences of the brain and surrounding structures were obtained without intravenous contrast. Angiographic images of the head were obtained using MRA technique without contrast. COMPARISON:  Head CT 1606 hours today . FINDINGS: MRI HEAD FINDINGS DWI and axial FLAIR imaging only was obtained due to acute code stroke presentation. These reveal a small area of restricted diffusion in the posterior left corona radiata and left posterior lentiform along the margins of a chronic lacunar infarct (series 3, images 37 and 38). There is only mild associated FLAIR hyperintensity limited to the lentiform. No other No restricted diffusion or evidence of acute infarction. No midline shift, mass effect, or evidence of intracranial mass lesion. No evidence of intracranial hemorrhage. Scattered additional bilateral cerebral white matter FLAIR hyperintensity. MRA HEAD FINDINGS Antegrade flow in the posterior circulation with mildly dominant distal left vertebral artery. Highly tortuous vertebrobasilar junction but no distal vertebral stenosis. Both PICA origins remain normal. Tortuous basilar artery without stenosis. Normal SCA origins. Fetal type left PCA origin. Normal right PCA origin and posterior communicating artery. Bilateral PCA branches are within normal limits. Antegrade flow in both ICA siphons. Tortuous cervical right ICA just below the skull base. Mild siphon dolichoectasia without stenosis. Ophthalmic and posterior communicating artery origins appear normal. Patent carotid termini. Normal MCA and ACA origins. Tortuous proximal ACAs. Anterior communicating artery is within normal limits. Visible ACA branches are within normal limits. Right MCA M1, bifurcation, and  visible right MCA branches are within normal limits. Left MCA M1 segment is patent to the bifurcation without stenosis. Left MCA bifurcation and visible left MCA branches are within normal limits. IMPRESSION: 1. Negative for large vessel occlusion. 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 2. Positive for acute small vessel infarct along the posterior margins of the chronic lacune of the left corona radiata and lentiform. No evidence of associated hemorrhage or mass effect. These results were communicated to Dr. Cheral Marker at 6:08 pmon 3/15/2020by text page via the [facility] messaging system. Electronically Signed   By: Genevie Ann M.D.   On: 11/03/2018 18:08   Ct Head Code Stroke Wo Contrast  Result Date: 11/03/2018 CLINICAL DATA:  Code stroke. 69 year old female with right side weakness. EXAM: CT HEAD WITHOUT CONTRAST TECHNIQUE: Contiguous axial images were obtained from the base of the skull through the vertex without intravenous contrast. COMPARISON:  Paranasal sinus CT 08/26/2010. FINDINGS: Brain: Chronic appearing lacunar infarct of the left corona radiata is new since 2012 on series 3, image 20. Mild additional left hemisphere white matter hypodensity. Vascular: Mild Calcified atherosclerosis at the skull base. Mildly hyperdense left MCA in the sylvian fissure on series 6, images 40 and 41 is conspicuously asymmetric. Skull: No cortically based acute infarct identified. No  midline shift, ventriculomegaly, mass effect, evidence of mass lesion, intracranial hemorrhage or evidence of cortically based acute infarction. Sinuses/Orbits: Well pneumatized. Mild bubbly opacity in the right sphenoid. Tympanic cavities and mastoids are clear. Other: Visualized orbits and scalp soft tissues are within normal limits. ASPECTS [facility] Stroke Program Early CT Score) - Ganglionic level infarction (caudate, lentiform nuclei, internal capsule, insula, M1-M3 cortex): 7 - Supraganglionic infarction (M4-M6 cortex): 3 Total  score (0-10 with 10 being normal): 10 IMPRESSION: 1. Suspicious left MCA density in the Sylvian fissure. But no CT changes of cortically based infarct. ASPECTS is 10.   No acute intracranial hemorrhage identified. 2. 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 3. These results were communicated to Dr. Cheral Marker at [place] 3/15/2020by text page via the [facility] messaging system. Electronically Signed   By: Genevie Ann M.D.   On: 11/03/2018 16:17    Pending Labs [employer] (From admission, onward)    Start     Ordered   Signed and Held  HIV antibody (Routine Testing)  Once,   R     Signed and Held   Signed and Held  Hemoglobin A1c  Tomorrow morning,   R     Signed and Held   Signed and Held  Lipid panel  Tomorrow morning,   R    Comments:  Fasting    Signed and Held   Signed and Held  CBC  (heparin)  Once,   R    Comments:  Baseline for heparin therapy IF NOT ALREADY DRAWN.  Notify MD if PLT < 100 K.    Signed and Held   Signed and Held  Creatinine, serum  (heparin)  Once,   R    Comments:  Baseline for heparin therapy IF NOT ALREADY DRAWN.    Signed and Held          Vitals/Pain Today's Vitals   11/03/18 1636 11/03/18 1645 11/03/18 1752 11/03/18 1800  BP:  140/73 139/84 (!) 142/76  Pulse:  97 88 82  Resp:  19 (!) 22 (!) 22  Temp:      TempSrc:      SpO2:  96% 97% 90%  Weight:      Height:      PainSc: 0-No pain  0-No pain     Isolation Precautions No active isolations  Medications Medications - No data to display  Mobility walks Moderate fall risk   Focused Assessments Neuro Assessment Handoff:  Swallow screen pass? Yes    NIH Stroke Scale ( + Modified Stroke Scale Criteria)  Interval: Initial Level of Consciousness (1a.)   : Alert, keenly responsive LOC Questions (1b. )   +: Answers both questions correctly LOC Commands (1c. )   + : Performs both tasks correctly Best Gaze (2. )  +: Normal Visual (3. )  +: No visual  loss Facial Palsy (4. )    : Minor paralysis Motor Arm, Left (5a. )   +: No drift Motor Arm, Right (5b. )   +: No drift Motor Leg, Left (6a. )   +: No drift Motor Leg, Right (6b. )   +: No drift Limb Ataxia (7. ): Absent Sensory (8. )   +: Normal, no sensory loss Best Language (9. )   +: No aphasia Dysarthria (10. ): Normal Extinction/Inattention (11.)   +: No Abnormality Modified SS Total  +: 0 Complete NIHSS TOTAL: 1 Last date known well: 11/03/18 Last time known well:  1500 Neuro Assessment:   Neuro Checks:   Initial (11/03/18 1610)  Last Documented NIHSS Modified Score: 0 (11/03/18 1752) Has TPA been given? No If patient is a Neuro Trauma and patient is going to OR before floor call report to [place] nurse: [phone] or [phone]     R Recommendations: See Admitting Provider Note  Report given to:

## 2018-11-03 NOTE — ED Notes (Signed)
Attempted report. No answer.

## 2018-11-03 NOTE — ED Provider Notes (Signed)
[facility] EMERGENCY DEPARTMENT Provider Note   CSN: [id number] Arrival date & time: 11/03/18  1558    History   Chief Complaint Chief Complaint  Patient presents with  . Code Stroke    HPI [name] is a 68 y.o. female hx of DVT and PE on eliquis, depression, here presenting with strokelike symptoms.  Patient woke up this morning was feeling fine.  Around 3 PM she noticed acute onset of right arm and leg weakness.  She noticed that she had trouble getting up from the table.  EMS noticed that she has a right facial droop as well as right-sided weakness.  No history of stroke in the past and patient is taking Eliquis currently.      The history is provided by the patient.    Past Medical History:  Diagnosis Date  . Allergic rhinitis   . Anemia    recurrent iron defic.  Marland Kitchen Cervical polyp 03/2003  . DDD (degenerative disc disease)   . Depression    Dr. Toy Care (situational, divorce, loss of parents)  . Diverticulosis   . DVT (deep venous thrombosis) (HCC)    both legs  . Esophageal stricture   . GERD (gastroesophageal reflux disease)   . Hiatal hernia   . Hyperlipidemia   . Hypertension   . Hypertension   . Neuropathy, peripheral   . OSA (obstructive sleep apnea)   . Osteoarthritis   . Pulmonary embolism ([place])    left lung  . Wegner's disease (congenital syphilitic osteochondritis) 2012   DR. Ginger Organ at [facility]    Patient Active Problem List   Diagnosis Date Noted  . Ulcer of lower limb ([place]) 01/02/2014  . Chronic venous insufficiency 01/02/2014  . 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 Varicose veins of both lower extremities with complications 08/65/7846  . 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 Lung mass 12/06/2010  . HEADACHE 08/24/2010  . RASH-NONVESICULAR 08/23/2010  . CELLULITIS 08/09/2010  . ANEMIA-IRON DEFICIENCY  02/16/2010  . CARDIAC MURMUR, AORTIC 01/10/2010  . POSTMENOPAUSAL SYNDROME 03/09/2009  . HYPERLIPIDEMIA 12/07/2008  . PERNICIOUS ANEMIA 12/07/2008  . DEGENERATIVE DISC DISEASE, LUMBOSACRAL SPINE W/RADICULOPATHY 12/07/2008  . FASTING HYPERGLYCEMIA 12/07/2008  . IRON DEFICIENCY ANEMIA SECONDARY TO BLOOD LOSS 02/14/2008  . HYPERTENSION 01/01/2008  . ALLERGIC RHINITIS 12/03/2007  . GERD 12/03/2007  . PAIN IN JOINT, SITE UNSPECIFIED 12/03/2007  . SLEEP APNEA 12/03/2007  . HIATAL HERNIA 02/23/2006  . Diverticulosis of colon (without mention of hemorrhage) 12/30/2002    Past Surgical History:  Procedure Laterality Date  . back injection     steroid injection x2  . BRONCHOSCOPY  april 2012  . iron infusion  11/14   seeing hematologist at [facility]  . lung biospy  may 2012  . PARTIAL HIP ARTHROPLASTY Left 03/2014     OB History    Gravida  0   Para  0   Term  0   Preterm  0   AB  0   Living  0     SAB  0   TAB  0   Ectopic  0   Multiple  0   Live Births               Home Medications    Prior to Admission medications   Medication Sig Start Date End Date Taking? Authorizing Provider  acetaminophen (TYLENOL) 325 MG tablet Take  650 mg by mouth as needed.      [provider]  apixaban (ELIQUIS) 5 MG TABS tablet Take 5 mg by mouth 2 (two) times daily.    [provider]  calcium carbonate (OS-CAL) 600 MG TABS tablet Take by mouth.    [provider]  Cholecalciferol (VITAMIN D PO) Take by mouth daily.    [provider]  DULoxetine (CYMBALTA) 60 MG capsule Take 60 mg by mouth at bedtime.    [provider]  ferrous sulfate 325 (65 FE) MG tablet Take 325 mg by mouth 2 (two) times daily. Reported on 11/02/2015    [provider]  fish oil-omega-3 fatty acids 1000 MG capsule Take 2 g by mouth daily.    [provider]  hydrochlorothiazide (MICROZIDE) 12.5 MG capsule Take 12.5 mg by mouth daily.  07/14/13    [provider]  hydroxyurea (HYDREA) 500 MG capsule Take 1,000 mg by mouth daily.  07/14/13   [provider]  ibandronate (BONIVA) 150 MG tablet Take by mouth.    [provider]  lisinopril (PRINIVIL,ZESTRIL) 10 MG tablet Take 10 mg by mouth daily.      [provider]  Multiple Vitamin (MULTIVITAMIN) tablet Take 1 tablet by mouth daily.      [provider]  nystatin (MYCOSTATIN/NYSTOP) 100000 UNIT/GM POWD Reported on 11/02/2015 03/15/15   [provider]  traMADol (ULTRAM) 50 MG tablet Take 100 mg by mouth every 6 (six) hours as needed.      [provider]  triamcinolone cream (KENALOG) 0.1 % Apply topically 2 (two) times daily. 10/30/16   [provider]    Family History Family History  Problem Relation Age of Onset  . Hypertension Mother   . Neuropathy Mother   . Varicose Veins Mother   . Stroke Father   . Lung cancer Father   . Arthritis Father   . Cancer Father   . Deep vein thrombosis Father   . Hypertension Father   . Varicose Veins Father   . Diabetes Maternal Grandfather   . Diabetes Maternal Uncle   . Diabetes Maternal Aunt   . Hypertension Sister   . Colon cancer Neg Hx     Social History Social History   Tobacco Use  . Smoking status: Never Smoker  . Smokeless tobacco: Never Used  Substance Use Topics  . Alcohol use: No    Comment: rare  . Drug use: No     Allergies   Latex; Other; Pregabalin; and Codeine   Review of Systems Review of Systems  Neurological: Positive for facial asymmetry, speech difficulty and weakness.  All other systems reviewed and are negative.    Physical Exam Updated Vital Signs BP 129/67   Pulse 98   Temp 98.3 F (36.8 C) (Oral)   Resp 20   Ht 5\' 3"  (1.6 m)   Wt 83 kg   LMP 12/20/2002   SpO2 96%   BMI 32.41 kg/m   Physical Exam Vitals signs and nursing note reviewed.  Constitutional:      Comments: Chronically ill   HENT:     Head:  Normocephalic.     Right Ear: Tympanic membrane normal.     Left Ear: Tympanic membrane normal.     Nose: Nose normal.     Mouth/Throat:     Mouth: Mucous membranes are moist.  Eyes:     Extraocular Movements: Extraocular movements intact.     Pupils: Pupils are  equal, round, and reactive to light.  Neck:     Musculoskeletal: Normal range of motion.  Cardiovascular:     Rate and Rhythm: Normal rate and regular rhythm.     Pulses: Normal pulses.     Heart sounds: Normal heart sounds.  Pulmonary:     Effort: Pulmonary effort is normal.     Breath sounds: Normal breath sounds.  Abdominal:     General: Abdomen is flat.     Palpations: Abdomen is soft.  Musculoskeletal: Normal range of motion.  Skin:    General: Skin is warm.     Capillary Refill: Capillary refill takes less than 2 seconds.  Neurological:     Mental Status: She is alert.     Comments: R facial droop. Strength 4/5 R arm and leg. + pronator drift R arm.   Psychiatric:        Mood and Affect: Mood normal.        Behavior: Behavior normal.      ED Treatments / Results  Labs (all labs ordered are listed, but only abnormal results are displayed) Labs Reviewed  CBC - Abnormal; Notable for the following components:      Result Value   RBC 2.71 (*)    Hemoglobin 9.7 (*)    HCT 31.5 (*)    MCV 116.2 (*)    MCH 35.8 (*)    RDW 16.5 (*)    Platelets 522 (*)    All other components within normal limits  DIFFERENTIAL - Abnormal; Notable for the following components:   Lymphs Abs 0.5 (*)    All other components within normal limits  I-STAT CREATININE, ED - Abnormal; Notable for the following components:   Creatinine, Ser 2.20 (*)    All other components within normal limits  PROTIME-INR  APTT  ETHANOL  COMPREHENSIVE METABOLIC PANEL  RAPID URINE DRUG SCREEN, HOSP PERFORMED  URINALYSIS, ROUTINE W REFLEX MICROSCOPIC    EKG EKG Interpretation  Date/Time:  Sunday November 03 2018 16:16:21 EDT Ventricular Rate:   96 PR Interval:    QRS Duration: 78 QT Interval:  337 QTC Calculation: 426 R Axis:     Text Interpretation:  Sinus rhythm Right atrial enlargement Borderline low voltage, extremity leads No significant change since last tracing Confirmed by Wandra Arthurs [phone]) on 11/03/2018 4:28:07 PM   Radiology Ct Head Code Stroke Wo Contrast  Result Date: 11/03/2018 CLINICAL DATA:  Code stroke. 68 year old female with right side weakness. EXAM: CT HEAD WITHOUT CONTRAST TECHNIQUE: Contiguous axial images were obtained from the base of the skull through the vertex without intravenous contrast. COMPARISON:  Paranasal sinus CT 08/26/2010. FINDINGS: Brain: Chronic appearing lacunar infarct of the left corona radiata is new since 2012 on series 3, image 20. Mild additional left hemisphere white matter hypodensity. Vascular: Mild Calcified atherosclerosis at the skull base. Mildly hyperdense left MCA in the sylvian fissure on series 6, images 40 and 41 is conspicuously asymmetric. Skull: No cortically based acute infarct identified. No midline shift, ventriculomegaly, mass effect, evidence of mass lesion, intracranial hemorrhage or evidence of cortically based acute infarction. Sinuses/Orbits: Well pneumatized. Mild bubbly opacity in the right sphenoid. Tympanic cavities and mastoids are clear. Other: Visualized orbits and scalp soft tissues are within normal limits. ASPECTS [facility] Stroke Program Early CT Score) - Ganglionic level infarction (caudate, lentiform nuclei, internal capsule, insula, M1-M3 cortex): 7 - Supraganglionic infarction (M4-M6 cortex): 3 Total score (0-10 with 10 being normal): 10 IMPRESSION: 1. Suspicious left MCA density  in the Sylvian fissure. But no CT changes of cortically based infarct. ASPECTS is 10.   No acute intracranial hemorrhage identified. 2. 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 3. These results were communicated to Dr. Cheral Marker at [place]  3/15/2020by text page via the [facility] messaging system. Electronically Signed   By: Genevie Ann M.D.   On: 11/03/2018 16:17    Procedures Procedures (including critical care time)  Medications Ordered in ED Medications - No data to display   Initial Impression / Assessment and Plan / ED Course  I have reviewed the triage vital signs and the nursing notes.  Pertinent labs & imaging results that were available during my care of the patient were reviewed by me and considered in my medical decision making (see chart for details).       [name] is a 68 y.o. female here with R sided weakness, R facial droop. Patient is within TPA window so code stroke was activated. Will get labs, CT head. Neuro at bedside.    6:59 PM  CT head showed likely stroke. MRI brain confirmed L lacunae and corona radiata stroke. No LVO. Dr. Cheral Marker recommend no IR TPA. Patient will be admitted for stroke workup.    Final Clinical Impressions(s) / ED Diagnoses   Final diagnoses:  None    ED Discharge Orders    None       Drenda Freeze, MD 11/03/18 1900

## 2018-11-03 NOTE — Code Documentation (Signed)
Patient arrived via EMS at 1558.  Code Stroke called en route.  Stat head CT and labs done.  Per patient she awoke normal this am, this afternoon at 1pm she noted sudden onset of right side weakness. On arrival she has a mild facial droop.  NIHSS 1.  Plan MRI  ED RN to call if assistance needed.

## 2018-11-03 NOTE — H&P (Signed)
History and Physical   [name] WNI:[id number] DOB: 18-Nov-1950 DOA: 11/03/2018  Referring MD/NP/PA: Dr. Darl Householder  PCP: Crist Infante, MD   Patient coming from: Home  Chief Complaint: Right arm and leg weakness  HPI: [name] is a 68 y.o. female with medical history significant of hypertension, hyperlipidemia, GERD, Wegener's granulomatosis, DVT, obstructive sleep apnea, history of PE on Eliquis who was doing well until this morning when she woke up fine.  Around 3:00 patient noticed sudden weakness in her right arm and leg.  She could not get up from a table.  EMS was called and patient was brought to the ER.  A code stroke was called for but patient was not a candidate for TPA due to being on Eliquis.  On the way to the hospital EMS noted a right facial droop.  She also has right-sided weakness.  Patient has not had any previous stroke but have family history of strokes in her dad.  She has been evaluated by neurology and at this point being admitted after MRI showed an acute stroke.  Patient still has residual weakness on the right.  She is able to speak without difficulty..  ED Course: Her temperature is 96 blood pressure 150/79 pulse 98 respiratory 24 oxygen sats 98% on room air.  White count 7.0 hemoglobin 9.7 and platelets 522.  Chemistry showed creatinine of 2.2 and BUN of 24.  INR 1.1 glucose 95.  Urinalysis is negative.  Urine drug screen also negative. 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  No hemorrhage.  MRA shows no large vessel occlusion.  Patient has been seen by neurology and is being admitted for stroke work-up.  Review of Systems: As per HPI otherwise 10 point review of systems negative.    Past Medical History:  Diagnosis Date  . Allergic rhinitis   . Anemia    recurrent iron defic.  Marland Kitchen Cervical polyp 03/2003  . DDD (degenerative disc disease)   . Depression    Dr.  Toy Care (situational, divorce, loss of parents)  . Diverticulosis   . DVT (deep venous thrombosis) (HCC)    both legs  . Esophageal stricture   . GERD (gastroesophageal reflux disease)   . Hiatal hernia   . Hyperlipidemia   . Hypertension   . Hypertension   . Neuropathy, peripheral   . OSA (obstructive sleep apnea)   . Osteoarthritis   . Pulmonary embolism ([place])    left lung  . Wegner's disease (congenital syphilitic osteochondritis) 2012   DR. Ginger Organ at [facility]    Past Surgical History:  Procedure Laterality Date  . back injection     steroid injection x2  . BRONCHOSCOPY  april 2012  . iron infusion  11/14   seeing hematologist at [facility]  . lung biospy  may 2012  . PARTIAL HIP ARTHROPLASTY Left 03/2014     reports that she has never smoked. She has never used smokeless tobacco. She reports that she does not drink alcohol or use drugs.  Allergies  Allergen Reactions  . Latex     Sensitive to latex, rash  . Other     Adhesive tape causes rash  . Pregabalin Swelling    Swelling of hands and feet  . Codeine     REACTION: itching    Family History  Problem Relation Age of Onset  . Hypertension Mother   . Neuropathy Mother   .  Varicose Veins Mother   . Stroke Father   . Lung cancer Father   . Arthritis Father   . Cancer Father   . Deep vein thrombosis Father   . Hypertension Father   . Varicose Veins Father   . Diabetes Maternal Grandfather   . Diabetes Maternal Uncle   . Diabetes Maternal Aunt   . Hypertension Sister   . Colon cancer Neg Hx      Prior to Admission medications   Medication Sig Start Date End Date Taking? Authorizing Provider  acetaminophen (TYLENOL) 325 MG tablet Take 650 mg by mouth as needed for mild pain.    Yes [provider]  apixaban (ELIQUIS) 5 MG TABS tablet Take 2.5 mg by mouth 2 (two) times daily.    Yes [provider]  azaTHIOprine (IMURAN) 50 MG tablet Take 100 mg by mouth daily. 06/14/18 06/14/19 Yes [provider]  calcium carbonate (OS-CAL) 600 MG TABS tablet Take 600 mg by mouth daily.    Yes [provider]  Cholecalciferol (VITAMIN D PO) Take 1 tablet by mouth 2 (two) times daily.    Yes [provider]  DULoxetine (CYMBALTA) 60 MG capsule Take 60 mg by mouth at bedtime.   Yes [provider]  gabapentin (NEURONTIN) 600 MG tablet Take 600 mg by mouth 2 (two) times daily. 10/18/18  Yes [provider]  hydrochlorothiazide (MICROZIDE) 12.5 MG capsule Take 12.5 mg by mouth daily.  07/14/13  Yes [provider]  hydroxyurea (HYDREA) 500 MG capsule Take 500-1,000 mg by mouth See admin instructions. 1000mg  in the morning and 500mg  at night 07/14/13  Yes [provider]  ibandronate (BONIVA) 150 MG tablet Take 150 mg by mouth every 30 (thirty) days.    Yes [provider]  lisinopril (PRINIVIL,ZESTRIL) 10 MG tablet Take 10 mg by mouth daily.     Yes [provider]  LORazepam (ATIVAN) 0.5 MG tablet Take 0.5 mg by mouth daily as needed for anxiety.  08/12/18  Yes [provider]  Multiple Vitamin (MULTIVITAMIN) tablet Take 1 tablet by mouth daily.     Yes [provider]  oxyCODONE-acetaminophen (PERCOCET/ROXICET) 5-325 MG tablet Take 1 tablet by mouth every 6 (six) hours as needed for pain.   Yes [provider]  predniSONE (DELTASONE) 5 MG tablet Take 5 mg by mouth daily. 07/15/18  Yes [provider]  triamcinolone cream (KENALOG) 0.1 % Apply topically 2 (two) times daily. 10/30/16  Yes [provider]    Physical Exam: Vitals:   11/03/18 1900 11/03/18 1939 11/03/18 2100 11/03/18 2300  BP: (!) 150/79  139/78 (!) 147/88  Pulse: 84  84   Resp: 19  (!) 21   Temp:   98.6 F (37 C) 97.9 F (36.6 C)  TempSrc:   Oral Oral  SpO2: 97% 96% 98% 96%  Weight:   80 kg   Height:          Constitutional: NAD, calm, comfortable Vitals:   11/03/18 1900 11/03/18 1939 11/03/18 2100  11/03/18 2300  BP: (!) 150/79  139/78 (!) 147/88  Pulse: 84  84   Resp: 19  (!) 21   Temp:   98.6 F (37 C) 97.9 F (36.6 C)  TempSrc:   Oral Oral  SpO2: 97% 96% 98% 96%  Weight:   80 kg   Height:       Eyes: PERRL, lids and conjunctivae normal ENMT: Mucous membranes are moist. Posterior pharynx clear  of any exudate or lesions.Normal dentition.  Neck: normal, supple, no masses, no thyromegaly Respiratory: clear to auscultation bilaterally, no wheezing, no crackles. Normal respiratory effort. No accessory muscle use.  Cardiovascular: Regular rate and rhythm, no murmurs / rubs / gallops. No extremity edema. 2+ pedal pulses. No carotid bruits.  Abdomen: no tenderness, no masses palpated. No hepatosplenomegaly. Bowel sounds positive.  Musculoskeletal: no clubbing / cyanosis. No joint deformity upper and lower extremities. Good ROM, no contractures. Normal muscle tone.  Skin: no rashes, lesions, ulcers. No induration Neurologic: Right facial droop, normal speech pattern, power is 4 out of 5 upper and lower extremity respectively on the right, 5 out of 5 upper and lower extremity on the left.  Distorted finger-nose test. Psychiatric: Normal judgment and insight. Alert and oriented x 3. Normal mood.     Labs on Admission: I have personally reviewed following labs and imaging studies  CBC: Recent Labs  Lab 11/03/18 1600  WBC 7.0  NEUTROABS 6.0  HGB 9.7*  HCT 31.5*  MCV 116.2*  PLT 500*   Basic Metabolic Panel: Recent Labs  Lab 11/03/18 1600 11/03/18 1606  NA 143  --   K 4.1  --   CL 103  --   CO2 30  --   GLUCOSE 95  --   BUN 24*  --   CREATININE 0.84 2.20*  CALCIUM 9.9  --    GFR: Estimated Creatinine Clearance: 24.8 mL/min (A) (by C-G formula based on SCr of 2.2 mg/dL (H)). Liver Function Tests: Recent Labs  Lab 11/03/18 1600  AST 23  ALT 12  ALKPHOS 76  BILITOT 0.3  PROT 7.2  ALBUMIN 3.6   No results for input(s): LIPASE, AMYLASE in the last 168 hours. No  results for input(s): AMMONIA in the last 168 hours. Coagulation Profile: Recent Labs  Lab 11/03/18 1600  INR 1.0   Cardiac Enzymes: No results for input(s): CKTOTAL, CKMB, CKMBINDEX, TROPONINI in the last 168 hours. BNP (last 3 results) No results for input(s): PROBNP in the last 8760 hours. HbA1C: No results for input(s): HGBA1C in the last 72 hours. CBG: No results for input(s): GLUCAP in the last 168 hours. Lipid Profile: No results for input(s): CHOL, HDL, LDLCALC, TRIG, CHOLHDL, LDLDIRECT in the last 72 hours. Thyroid Function Tests: No results for input(s): TSH, T4TOTAL, FREET4, T3FREE, THYROIDAB in the last 72 hours. Anemia Panel: No results for input(s): VITAMINB12, FOLATE, FERRITIN, TIBC, IRON, RETICCTPCT in the last 72 hours. Urine analysis:    Component Value Date/Time   COLORURINE YELLOW 11/03/2018 1751   APPEARANCEUR CLOUDY (A) 11/03/2018 1751   LABSPEC 1.014 11/03/2018 1751   LABSPEC 1.015 01/03/2011 1602   PHURINE 9.0 (H) 11/03/2018 1751   GLUCOSEU NEGATIVE 11/03/2018 1751   HGBUR NEGATIVE 11/03/2018 1751   HGBUR negative 08/23/2010 1536   BILIRUBINUR NEGATIVE 11/03/2018 1751   BILIRUBINUR N 10/29/2014 1125   BILIRUBINUR Negative 01/03/2011 [place] 11/03/2018 1751   PROTEINUR NEGATIVE 11/03/2018 1751   UROBILINOGEN negative 10/29/2014 1125   UROBILINOGEN 0.2 03/07/2011 2045   NITRITE NEGATIVE 11/03/2018 1751   LEUKOCYTESUR NEGATIVE 11/03/2018 1751   LEUKOCYTESUR Negative 01/03/2011 1602   Sepsis Labs: @LABRCNTIP (procalcitonin:4,lacticidven:4) )No results found for this or any previous visit (from the past 240 hour(s)).   Radiological Exams on Admission: Mr Virgel Paling XF Contrast  Result Date: 11/03/2018 CLINICAL DATA:  68 year old female code stroke with right side weakness and questionable hyperdense left MCA on plain head CT today. EXAM: MRI  HEAD WITHOUT CONTRAST LIMITED MRA HEAD WITHOUT CONTRAST TECHNIQUE: Limited pulse sequences of  the brain and surrounding structures were obtained without intravenous contrast. Angiographic images of the head were obtained using MRA technique without contrast. COMPARISON:  Head CT 1606 hours today . FINDINGS: MRI HEAD FINDINGS DWI and axial FLAIR imaging only was obtained due to acute code stroke presentation. These reveal a small area of restricted diffusion in the posterior left corona radiata and left posterior lentiform along the margins of a chronic lacunar infarct (series 3, images 37 and 38). There is only mild associated FLAIR hyperintensity limited to the lentiform. No other No restricted diffusion or evidence of acute infarction. No midline shift, mass effect, or evidence of intracranial mass lesion. No evidence of intracranial hemorrhage. Scattered additional bilateral cerebral white matter FLAIR hyperintensity. MRA HEAD FINDINGS Antegrade flow in the posterior circulation with mildly dominant distal left vertebral artery. Highly tortuous vertebrobasilar junction but no distal vertebral stenosis. Both PICA origins remain normal. Tortuous basilar artery without stenosis. Normal SCA origins. Fetal type left PCA origin. Normal right PCA origin and posterior communicating artery. Bilateral PCA branches are within normal limits. Antegrade flow in both ICA siphons. Tortuous cervical right ICA just below the skull base. Mild siphon dolichoectasia without stenosis. Ophthalmic and posterior communicating artery origins appear normal. Patent carotid termini. Normal MCA and ACA origins. Tortuous proximal ACAs. Anterior communicating artery is within normal limits. Visible ACA branches are within normal limits. Right MCA M1, bifurcation, and visible right MCA branches are within normal limits. Left MCA M1 segment is patent to the bifurcation without stenosis. Left MCA bifurcation and visible left MCA branches are within normal limits. IMPRESSION: 1. Negative for large vessel occlusion. 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 2. Positive for acute small vessel infarct along the posterior margins of the chronic lacune of the left corona radiata and lentiform. No evidence of associated hemorrhage or mass effect. These results were communicated to Dr. Cheral Marker at 6:08 pmon 3/15/2020by text page via the [facility] messaging system. Electronically Signed   By: Genevie Ann M.D.   On: 11/03/2018 18:08   Mr Brain Wo Contrast  Result Date: 11/03/2018 CLINICAL DATA:  69 year old female code stroke with right side weakness and questionable hyperdense left MCA on plain head CT today. EXAM: MRI HEAD WITHOUT CONTRAST LIMITED MRA HEAD WITHOUT CONTRAST TECHNIQUE: Limited pulse sequences of the brain and surrounding structures were obtained without intravenous contrast. Angiographic images of the head were obtained using MRA technique without contrast. COMPARISON:  Head CT 1606 hours today . FINDINGS: MRI HEAD FINDINGS DWI and axial FLAIR imaging only was obtained due to acute code stroke presentation. These reveal a small area of restricted diffusion in the posterior left corona radiata and left posterior lentiform along the margins of a chronic lacunar infarct (series 3, images 37 and 38). There is only mild associated FLAIR hyperintensity limited to the lentiform. No other No restricted diffusion or evidence of acute infarction. No midline shift, mass effect, or evidence of intracranial mass lesion. No evidence of intracranial hemorrhage. Scattered additional bilateral cerebral white matter FLAIR hyperintensity. MRA HEAD FINDINGS Antegrade flow in the posterior circulation with mildly dominant distal left vertebral artery. Highly tortuous vertebrobasilar junction but no distal vertebral stenosis. Both PICA origins remain normal. Tortuous basilar artery without stenosis. Normal SCA origins. Fetal type left PCA origin. Normal right PCA origin and posterior communicating artery. Bilateral PCA branches are within normal limits.  Antegrade flow in both ICA siphons. Tortuous cervical right  ICA just below the skull base. Mild siphon dolichoectasia without stenosis. Ophthalmic and posterior communicating artery origins appear normal. Patent carotid termini. Normal MCA and ACA origins. Tortuous proximal ACAs. Anterior communicating artery is within normal limits. Visible ACA branches are within normal limits. Right MCA M1, bifurcation, and visible right MCA branches are within normal limits. Left MCA M1 segment is patent to the bifurcation without stenosis. Left MCA bifurcation and visible left MCA branches are within normal limits. IMPRESSION: 1. Negative for large vessel occlusion. 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 2. Positive for acute small vessel infarct along the posterior margins of the chronic lacune of the left corona radiata and lentiform. No evidence of associated hemorrhage or mass effect. These results were communicated to Dr. Cheral Marker at 6:08 pmon 3/15/2020by text page via the [facility] messaging system. Electronically Signed   By: Genevie Ann M.D.   On: 11/03/2018 18:08   Ct Head Code Stroke Wo Contrast  Result Date: 11/03/2018 CLINICAL DATA:  Code stroke. 68 year old female with right side weakness. EXAM: CT HEAD WITHOUT CONTRAST TECHNIQUE: Contiguous axial images were obtained from the base of the skull through the vertex without intravenous contrast. COMPARISON:  Paranasal sinus CT 08/26/2010. FINDINGS: Brain: Chronic appearing lacunar infarct of the left corona radiata is new since 2012 on series 3, image 20. Mild additional left hemisphere white matter hypodensity. Vascular: Mild Calcified atherosclerosis at the skull base. Mildly hyperdense left MCA in the sylvian fissure on series 6, images 40 and 41 is conspicuously asymmetric. Skull: No cortically based acute infarct identified. No midline shift, ventriculomegaly, mass effect, evidence of mass lesion, intracranial hemorrhage or evidence of cortically based  acute infarction. Sinuses/Orbits: Well pneumatized. Mild bubbly opacity in the right sphenoid. Tympanic cavities and mastoids are clear. Other: Visualized orbits and scalp soft tissues are within normal limits. ASPECTS [facility] Stroke Program Early CT Score) - Ganglionic level infarction (caudate, lentiform nuclei, internal capsule, insula, M1-M3 cortex): 7 - Supraganglionic infarction (M4-M6 cortex): 3 Total score (0-10 with 10 being normal): 10 IMPRESSION: 1. Suspicious left MCA density in the Sylvian fissure. But no CT changes of cortically based infarct. ASPECTS is 10.   No acute intracranial hemorrhage identified. 2. 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 3. These results were communicated to Dr. Cheral Marker at [place] 3/15/2020by text page via the [facility] messaging system. Electronically Signed   By: Genevie Ann M.D.   On: 11/03/2018 16:17    EKG: Independently reviewed.  Shows sinus rhythm with right atrial enlargement.  Rate of 96.  Poor R wave progression and low voltage EKG.  Assessment/Plan Principal Problem:   Acute CVA (cerebrovascular accident) [facility]) Active Problems:   Hyperlipemia   Essential hypertension   GERD   Wegener's granulomatosis with vasculitis ([place])   Ulcer of lower limb ([place])   Chronic venous insufficiency     #1 acute right-sided CVA: This affects the left posterior limb of the coronary radiator.  Patient will be admitted and work-up for CVA will be completed.  She has been on Eliquis but not on statin.  Will check fasting lipid panel, echocardiogram, carotid Dopplers.  Neurology will follow.  Patient will be initiated on aspirin as well as statin.  PT and OT consultation as well as speech therapy evaluation.  #2 hyperlipidemia: Again patient will be on a statin now.  #3 essential hypertension: Continue with home regimen of blood pressure medications.  #4 chronic right lower extremity ulcer: Patient goes to the wound  care at  [facility].  She is due for visit on Tuesday.  Wound care consult if patient is still in the hospital.  #5 GERD: Continue with PPIs.  #6 history of Wegener's granulomatosis: Continue with home regimen unchanged.  #7 chronic kidney disease stage IV: Most likely secondary to Wegener's granulomatosis.  Hydrate and monitor renal function.   DVT prophylaxis: Heparin Code Status: Full code Family Communication: Sister at bedside Disposition Plan: To be determined Consults called: Neurology Dr. Cheral Marker Admission status: Inpatient  Severity of Illness: The appropriate patient status for this patient is INPATIENT. Inpatient status is judged to be reasonable and necessary in order to provide the required intensity of service to ensure the patient's safety. The patient's presenting symptoms, physical exam findings, and initial radiographic and laboratory data in the context of their chronic comorbidities is felt to place them at high risk for further clinical deterioration. Furthermore, it is not anticipated that the patient will be medically stable for discharge from the hospital within 2 midnights of admission. The following factors support the patient status of inpatient.   " The patient's presenting symptoms include right-sided weakness. " The worrisome physical exam findings include right hemiparesis. " The initial radiographic and laboratory data are worrisome because of MRI confirming the stroke. " The chronic co-morbidities include Wegener's with PEs.   * I certify that at the point of admission it is my clinical judgment that the patient will require inpatient hospital care spanning beyond 2 midnights from the point of admission due to high intensity of service, high risk for further deterioration and high frequency of surveillance required.Barbette Merino MD [facility] Pager [phone]  If 7PM-7AM, please contact night-coverage [email or web address] Password [facility]  11/03/2018,  11:23 PM

## 2018-11-03 NOTE — Consult Note (Addendum)
[place]   Requesting Physician: Dr. Darl Householder    Chief Complaint: Right side weakness  History obtained from:  Patient  / EMS  HPI:                                                                                                                                         [name] is an 68 y.o. female with a PMHx of PE (on Eliquis), OSA, HTN and HLD who presented to the [facility] ED as a code stroke for c/o sudden onset right sided weakness.   Per EMS and patient she woke up this morning in her usual state of health and at about 1 pm she had sudden onset of right arm and leg weakness. She noticed trouble trying to get up from the table. No prior stroke history.  Family Denies any unsteady gait. Patient denies any CP, numbness, tingling, SOB, fever, chills, diarrhea, incontinence.   ED course:  CTH: no hemorrhage, chronic lacunar infarct left corona radiata, suspicious left MCA  BP: 126/82 BG 192 Creatinine: 2.2  Time last known well: 1500 tPA Given: No: on Eliquis   Modified Rankin: Rankin Score=1 NIHSS:0   Past Medical History:  Diagnosis Date  . Allergic rhinitis   . Anemia    recurrent iron defic.  Marland Kitchen Cervical polyp 03/2003  . DDD (degenerative disc disease)   . Depression    Dr. Toy Care (situational, divorce, loss of parents)  . Diverticulosis   . DVT (deep venous thrombosis) (HCC)    both legs  . Esophageal stricture   . GERD (gastroesophageal reflux disease)   . Hiatal hernia   . Hyperlipidemia   . Hypertension   . Hypertension   . Neuropathy, peripheral   . OSA (obstructive sleep apnea)   . Osteoarthritis   . Pulmonary embolism ([place])    left lung  . Wegner's disease (congenital syphilitic osteochondritis) 2012   DR. Ginger Organ at [facility]    Past Surgical History:  Procedure Laterality Date  . back injection     steroid injection x2  . BRONCHOSCOPY  april 2012  . iron infusion  11/14   seeing  hematologist at [facility]  . lung biospy  may 2012  . PARTIAL HIP ARTHROPLASTY Left 03/2014    Family History  Problem Relation Age of Onset  . Hypertension Mother   . Neuropathy Mother   . Varicose Veins Mother   . Stroke Father   . Lung cancer Father   . Arthritis Father   . Cancer Father   . Deep vein thrombosis Father   . Hypertension Father   .  Varicose Veins Father   . Diabetes Maternal Grandfather   . Diabetes Maternal Uncle   . Diabetes Maternal Aunt   . Hypertension Sister   . Colon cancer Neg Hx          Social History:  reports that she has never smoked. She has never used smokeless tobacco. She reports that she does not drink alcohol or use drugs.  Allergies:  Allergies  Allergen Reactions  . Latex     Sensitive to latex, rash  . Other     Adhesive tape causes rash  . Pregabalin Swelling    Swelling of hands and feet  . Codeine     REACTION: itching    Medications:                                                                                                                           No current facility-administered medications for this encounter.    Current Outpatient Medications  Medication Sig Dispense Refill  . acetaminophen (TYLENOL) 325 MG tablet Take 650 mg by mouth as needed.      Marland Kitchen apixaban (ELIQUIS) 5 MG TABS tablet Take 5 mg by mouth 2 (two) times daily.    . calcium carbonate (OS-CAL) 600 MG TABS tablet Take by mouth.    . Cholecalciferol (VITAMIN D PO) Take by mouth daily.    . DULoxetine (CYMBALTA) 60 MG capsule Take 60 mg by mouth at bedtime.    . ferrous sulfate 325 (65 FE) MG tablet Take 325 mg by mouth 2 (two) times daily. Reported on 11/02/2015    . fish oil-omega-3 fatty acids 1000 MG capsule Take 2 g by mouth daily.    . hydrochlorothiazide (MICROZIDE) 12.5 MG capsule Take 12.5 mg by mouth daily.     . hydroxyurea (HYDREA) 500 MG capsule Take 1,000 mg by mouth daily.     Marland Kitchen ibandronate (BONIVA) 150 MG tablet Take by mouth.    Marland Kitchen  lisinopril (PRINIVIL,ZESTRIL) 10 MG tablet Take 10 mg by mouth daily.      . Multiple Vitamin (MULTIVITAMIN) tablet Take 1 tablet by mouth daily.      Marland Kitchen nystatin (MYCOSTATIN/NYSTOP) 100000 UNIT/GM POWD Reported on 11/02/2015    . traMADol (ULTRAM) 50 MG tablet Take 100 mg by mouth every 6 (six) hours as needed.      . triamcinolone cream (KENALOG) 0.1 % Apply topically 2 (two) times daily.       ROS:  ROS was performed and is negative except as noted in HPI  General Examination:                                                                                                      Last menstrual period 12/20/2002.  HEENT-  Normocephalic, no lesions, without obvious abnormality.  Normal external eye and conjunctiva.  Lungs-no rhonchi or wheezing noted, no excessive working breathing.  Saturations within normal limits  Abdomen- All 4 quadrants palpated and non tender Extremities- Warm, dry and intact Musculoskeletal-swelling in RLE   Neurological Examination Mental Status: Alert, oriented, thought content appropriate.  Speech fluent without evidence of aphasia. Naming and repetition intact. Able to follow  commands without difficulty. Cranial Nerves: II: Visual fields grossly normal. PERRL.  III,IV, VI: ptosis not present, EOMI V,VII: smile symmetric, cool temp and  facial light touch sensation normal bilaterally VIII: hearing normal bilaterally IX,X: uvula rises midline XI: bilateral shoulder shrug XII: midline tongue extension Motor: Right : Upper extremity   5/5  Left:     Upper extremity   5/5  Lower extremity   5/5   Lower extremity   5/5 Tone and bulk:normal tone throughout; no atrophy noted No pronator drift Sensory: cool temp and  light touch intact throughout, bilaterally Deep Tendon Reflexes: 3+ biceps, 2+ patellae Plantars: Right:  downgoing   Left: downgoing Cerebellar: normal finger-to-nose bilaterally  Gait: deferred   Lab Results: Basic Metabolic Panel: Recent Labs  Lab 11/03/18 1606  CREATININE 2.20*    CBC: Recent Labs  Lab 11/03/18 1600  WBC 7.0  HGB 9.7*  HCT 31.5*  MCV 116.2*  PLT 522*    Imaging: Ct Head Code Stroke Wo Contrast  Result Date: 11/03/2018 CLINICAL DATA:  Code stroke. 68 year old female with right side weakness. EXAM: CT HEAD WITHOUT CONTRAST TECHNIQUE: Contiguous axial images were obtained from the base of the skull through the vertex without intravenous contrast. COMPARISON:  Paranasal sinus CT 08/26/2010. FINDINGS: Brain: Chronic appearing lacunar infarct of the left corona radiata is new since 2012 on series 3, image 20. Mild additional left hemisphere white matter hypodensity. Vascular: Mild Calcified atherosclerosis at the skull base. Mildly hyperdense left MCA in the sylvian fissure on series 6, images 40 and 41 is conspicuously asymmetric. Skull: No cortically based acute infarct identified. No midline shift, ventriculomegaly, mass effect, evidence of mass lesion, intracranial hemorrhage or evidence of cortically based acute infarction. Sinuses/Orbits: Well pneumatized. Mild bubbly opacity in the right sphenoid. Tympanic cavities and mastoids are clear. Other: Visualized orbits and scalp soft tissues are within normal limits. ASPECTS [facility] Stroke Program Early CT Score) - Ganglionic level infarction (caudate, lentiform nuclei, internal capsule, insula, M1-M3 cortex): 7 - Supraganglionic infarction (M4-M6 cortex): 3 Total score (0-10 with 10 being normal): 10 IMPRESSION: 1. Suspicious left MCA density in the Sylvian fissure. But no CT changes of cortically based infarct. ASPECTS is 10.   No acute intracranial hemorrhage identified. 2. 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 3. These results were communicated to Dr. Cheral Marker at 4:16  pmon 3/15/2020by  text page via the [facility] messaging system. Electronically Signed   By: Genevie Ann M.D.   On: 11/03/2018 16:17     Laurey Morale, MSN, NP-C [facility] [phone] 11/03/2018, 4:12 PM   Assessment: 68 y.o. female  With PMH PE ( on eliquis), OSA, HTN, HLD who presented to [facility] ED as a code stroke for c/o sudden onset right side weakness.  1. CTH: no hemorrhage, chronic lacunar infarct left corona radiata, suspicious left MCA with possible hyperdensity. Unable to perform CTA due to low eGFR.  2. STAT MRA and MRI of head for suspicious left MCA 3. TPA not given d/t patient on Eliquis and NIHSS of 0 4. Not an endovascular candidate due to NIHSS of 0.  5. Stroke Risk Factors - hyperlipidemia and hypertension    Recommendations: -- BP goal : Permissive HTN upto 220/110 mmHg  -- STAT MRI Brain and MRA of the head w/o contrast -- Carotid dopplers -- Echocardiogram -- Continue apixaban after holding for 3 days (high risk of hemorrhagic conversion of acute stroke). For now, treat with ASA for secondary stroke prevention -- High intensity Statin   -- HgbA1c, fasting lipid panel -- PT consult, OT consult, Speech consult --Telemetry monitoring --Frequent neuro checks --Stroke swallow screen  --Please page stroke NP  Or  PA  Or MD from 8am -4 pm  as this patient from this time will be  followed by the stroke.   You can look them up on [email or web address]  Password [email or web address]   50 minutes spent in the emergent neurological evaluation and management of this critically ill acute stroke patient.   I have interviewed and examined the patient. I have performed the neurological exam, which was observed and documented by Laurey Morale, NP. I have formulated the assessment and plan.  Electronically signed: Dr. Kerney Elbe

## 2018-11-03 NOTE — ED Notes (Signed)
Patient transported to MRI 

## 2018-11-04 ENCOUNTER — Inpatient Hospital Stay (HOSPITAL_COMMUNITY): Payer: Medicare Other

## 2018-11-04 DIAGNOSIS — I639 Cerebral infarction, unspecified: Secondary | ICD-10-CM

## 2018-11-04 LAB — BASIC METABOLIC PANEL
Anion gap: 6 (ref 5–15)
BUN: 16 mg/dL (ref 8–23)
CO2: 29 mmol/L (ref 22–32)
Calcium: 8.8 mg/dL — ABNORMAL LOW (ref 8.9–10.3)
Chloride: 107 mmol/L (ref 98–111)
Creatinine, Ser: 0.66 mg/dL (ref 0.44–1.00)
GFR calc Af Amer: >60 mL/min (ref >60)
GFR calc non Af Amer: >60 mL/min (ref >60)
Glucose, Bld: 92 mg/dL (ref 70–99)
Potassium: 3.4 mmol/L — ABNORMAL LOW (ref 3.5–5.1)
Sodium: 142 mmol/L (ref 135–145)

## 2018-11-04 LAB — CBC
HCT: 29 % — ABNORMAL LOW (ref 36.0–46.0)
Hemoglobin: 9.2 g/dL — ABNORMAL LOW (ref 12.0–15.0)
MCH: 35.7 pg — ABNORMAL HIGH (ref 26.0–34.0)
MCHC: 31.7 g/dL (ref 30.0–36.0)
MCV: 112.4 fL — ABNORMAL HIGH (ref 80.0–100.0)
Platelets: 515 10*3/uL — ABNORMAL HIGH (ref 150–400)
RBC: 2.58 MIL/uL — ABNORMAL LOW (ref 3.87–5.11)
RDW: 16.1 % — ABNORMAL HIGH (ref 11.5–15.5)
WBC: 6.4 10*3/uL (ref 4.0–10.5)
nRBC: 0 % (ref 0.0–0.2)

## 2018-11-04 LAB — POCT I-STAT CREATININE: Creatinine, Ser: 2.2 mg/dL — ABNORMAL HIGH (ref 0.44–1.00)

## 2018-11-04 LAB — LIPID PANEL
Cholesterol: 132 mg/dL (ref 0–200)
HDL: 43 mg/dL (ref >40)
LDL Cholesterol: 59 mg/dL (ref 0–99)
Total CHOL/HDL Ratio: 3.1 RATIO
Triglycerides: 150 mg/dL — ABNORMAL HIGH (ref <150)
VLDL: 30 mg/dL (ref 0–40)

## 2018-11-04 LAB — ECHOCARDIOGRAM COMPLETE
Height: 63 in
Weight: 2821.89 oz

## 2018-11-04 LAB — HEMOGLOBIN A1C
Hgb A1c MFr Bld: 5.3 % (ref 4.8–5.6)
Mean Plasma Glucose: 105.41 mg/dL

## 2018-11-04 LAB — HIV ANTIBODY (ROUTINE TESTING W REFLEX): HIV Screen 4th Generation wRfx: NONREACTIVE

## 2018-11-04 MED ORDER — POTASSIUM CHLORIDE CRYS ER 20 MEQ PO TBCR
40.0000 meq | EXTENDED_RELEASE_TABLET | Freq: Once | ORAL | Status: AC
  Administered 2018-11-04: 40 meq via ORAL
  Filled 2018-11-04: qty 2

## 2018-11-04 MED ORDER — ASPIRIN EC 81 MG PO TBEC
81.0000 mg | DELAYED_RELEASE_TABLET | Freq: Every day | ORAL | Status: DC
  Administered 2018-11-05: 81 mg via ORAL
  Filled 2018-11-04: qty 1

## 2018-11-04 NOTE — Progress Notes (Signed)
Rehab Admissions Coordinator Note:  Per PT recommendation, this patient was screened by Jhonnie Garner for appropriateness for an Inpatient Acute Rehab Consult.  At this time, we are recommending Inpatient Rehab consult. AC will contact MD to request an IP Rehab Consult Order.   Jhonnie Garner 11/04/2018, 4:30 PM  I can be reached at [phone].

## 2018-11-04 NOTE — Evaluation (Addendum)
Occupational Therapy Evaluation Patient Details Name: [name] MRN: [id number] DOB: March 10, 1951 Today's Date: 11/04/2018    History of Present Illness Pt is a 68 y/o female with PMH of HTN, wegener's granulomatosis, DVT, obstructive sleep apnea, hx of PE presenting to ED with sudden weakness in R UE/LE. 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    Clinical Impression   PTA patient independent and driving.  Admitted for above and limited by problem list below, including R sided weakness, impaired balance, decreased coordination, decreased activity tolerance and safety.  Patient able to complete basic transfers with min assist using RW, grooming with min guard standing at sink, LB ADLs with min-mod assist and UB ADls with miin assist.  Noted gross weakness in R UE, increased time and effort to manipulate toothpaste, and cueing to maintain posture (R lateral lean) when standing at sink. Reports spouse is available to assist 24/7. Patient will benefit from continued OT services while admitted and after dc at [facility] level in order to optimize independence and safety with ADLs/mobility.     Follow Up Recommendations  Home health OT;Supervision/Assistance - 24 hour    Equipment Recommendations  None recommended by OT    Recommendations for Other Services       Precautions / Restrictions Precautions Precautions: Fall Required Braces or Orthoses: (R LE dressing--wound care clinic in [place] addressing ) Restrictions Weight Bearing Restrictions: No      Mobility Bed Mobility Overal bed mobility: Needs Assistance Bed Mobility: Supine to Sit     Supine to sit: Min guard     General bed mobility comments: min guard for safety, increased time and effort due to R sided weakness   Transfers Overall transfer level: Needs assistance Equipment used: Rolling walker (2 wheeled) Transfers: Sit  to/from Stand Sit to Stand: Min assist         General transfer comment: min assist to ascend into standing, for safety and balance     Balance Overall balance assessment: Needs assistance Sitting-balance support: No upper extremity supported;Feet supported Sitting balance-Leahy Scale: Fair     Standing balance support: No upper extremity supported;During functional activity Standing balance-Leahy Scale: Poor Standing balance comment: preference to at least 1 UE support, R lateral lean with cueing to maintain upright posture during grooming tasks                            ADL either performed or assessed with clinical judgement   ADL Overall ADL's : Needs assistance/impaired     Grooming: Min guard;Minimal assistance;Standing Grooming Details (indicate cue type and reason): increased time and effort to manipulate toothpaste, cueing to maintain upright posture (R lateral lean)  Upper Body Bathing: Minimal assistance;Sitting   Lower Body Bathing: Sit to/from stand;Minimal assistance Lower Body Bathing Details (indicate cue type and reason): requires min assist for safety and balance, decreased functional use of R UE  Upper Body Dressing : Sitting;Minimal assistance   Lower Body Dressing: Sit to/from stand;Moderate assistance Lower Body Dressing Details (indicate cue type and reason): min assist in standing, R lateral lean and decreased functional use of R UE  Toilet Transfer: Minimal assistance;Ambulation Toilet Transfer Details (indicate cue type and reason): simulated to recliner, requires cueing for hand placement and safety         Functional mobility during ADLs: Minimal assistance;Rolling walker;Min guard;Cueing for safety General  ADL Comments: limited by R sided weakness, impaired balance and decreased activity tolerance      Vision Baseline Vision/History: Wears glasses Wears Glasses: Distance only Patient Visual Report: No change from baseline Vision  Assessment?: No apparent visual deficits     Perception     Praxis      Pertinent Vitals/Pain Pain Assessment: No/denies pain     Hand Dominance Left   Extremity/Trunk Assessment Upper Extremity Assessment Upper Extremity Assessment: RUE deficits/detail RUE Deficits / Details: gross weakness 3/5 MMT; weaker distally with poor coordination  RUE Sensation: WNL RUE Coordination: decreased fine motor;decreased gross motor   Lower Extremity Assessment Lower Extremity Assessment: Defer to PT evaluation       Communication Communication Communication: No difficulties   Cognition Arousal/Alertness: Awake/alert Behavior During Therapy: WFL for tasks assessed/performed Overall Cognitive Status: Impaired/Different from baseline Area of Impairment: Problem solving;Awareness;Safety/judgement;Attention                   Current Attention Level: Selective     Safety/Judgement: Decreased awareness of safety Awareness: Emergent Problem Solving: Slow processing;Requires verbal cues General Comments: pt requires increased time to process and complete activities; appears close to baseline    General Comments  sister present and supportive    Exercises     Shoulder Instructions      Home Living Family/patient expects to be discharged to:: Private residence Living Arrangements: Spouse/significant other Available Help at Discharge: Family;Available 24 hours/day Type of Home: House Home Access: Stairs to enter [employer] of Steps: 5 Entrance Stairs-Rails: Left Home Layout: Two level;Able to live on main level with bedroom/bathroom     Bathroom Shower/Tub: Walk-in shower   Bathroom Toilet: Handicapped height     Home Equipment: Environmental consultant - 2 wheels;Cane - single point;Bedside commode;Shower seat          Prior Functioning/Environment Level of Independence: Independent        Comments: reports independent ADLs, IADLs, driving (going to wound clinic in  [place] for R LE wound care)        OT Problem List: Decreased strength;Decreased activity tolerance;Impaired balance (sitting and/or standing);Impaired UE functional use;Decreased coordination;Decreased knowledge of use of DME or AE;Decreased knowledge of precautions;Decreased range of motion      OT Treatment/Interventions: Self-care/ADL training;Therapeutic exercise;DME and/or AE instruction;Neuromuscular education;Therapeutic activities;Patient/family education;Balance training    OT Goals(Current goals can be found in the care plan section) Acute Rehab OT Goals Patient Stated Goal: to get better and use my right hand more  OT Goal Formulation: With patient Time For Goal Achievement: 11/18/18 Potential to Achieve Goals: Good ADL Goals Pt Will Perform Grooming: with modified independence;standing Pt Will Perform Upper Body Bathing: with modified independence;sitting Pt Will Perform Lower Body Dressing: with modified independence;sit to/from stand;with adaptive equipment Pt Will Transfer to Toilet: with modified independence;bedside commode;regular height toilet;ambulating Pt Will Perform Tub/Shower Transfer: Shower transfer;ambulating;rolling walker;shower seat;with supervision Pt/caregiver will Perform Home Exercise Program: Increased strength;Right Upper extremity;With theraputty;With written HEP provided  OT Frequency: Min 2X/week   Barriers to D/C:            Co-evaluation              AM-PAC OT "6 Clicks" Daily Activity     Outcome Measure Help from another person eating meals?: None Help from another person taking care of personal grooming?: A Little Help from another person toileting, which includes using toliet, bedpan, or urinal?: A Little Help from another person bathing (including washing, rinsing, drying)?:  A Little Help from another person to put on and taking off regular upper body clothing?: A Little Help from another person to put on and taking off  regular lower body clothing?: A Lot 6 Click Score: 18   End of Session Equipment Utilized During Treatment: Gait belt;Rolling walker Nurse Communication: Mobility status  Activity Tolerance: Patient tolerated treatment well Patient left: in chair;with call bell/phone within reach;with family/visitor present  OT Visit Diagnosis: Other abnormalities of gait and mobility (R26.89);Muscle weakness (generalized) (M62.81)                Time: 8466-5993 OT Time Calculation (min): 28 min Charges:  OT General Charges $OT Visit: 1 Visit OT Evaluation $OT Eval Moderate Complexity: 1 Mod OT Treatments $Self Care/Home Management : 8-22 mins  Delight Stare, [place] Pager [phone] Office [phone]   Delight Stare 11/04/2018, 10:54 AM

## 2018-11-04 NOTE — Progress Notes (Signed)
OT Cancellation Note  Patient Details Name: [name] MRN: [id number] DOB: Jun 25, 1951   Cancelled Treatment:    Reason Eval/Treat Not Completed: Patient at procedure or test/ unavailable. Will follow and initate OT eval when able.   Delight Stare, OT Acute Rehabilitation Services Pager [phone] Office [phone]   Delight Stare 11/04/2018, 8:31 AM

## 2018-11-04 NOTE — Progress Notes (Signed)
  Echocardiogram 2D Echocardiogram has been performed.  [name] 11/04/2018, 8:30 AM

## 2018-11-04 NOTE — Progress Notes (Addendum)
STROKE TEAM PROGRESS NOTE   INTERVAL HISTORY Her husband and sister is at the bedside.  She is up in the chair. Stable over night. I have reviewed the history of present illness with the patient and family. She has history of Wegener`s granulomatosis and is followed by rheumatology at [facility]:   11/04/18 0200 11/04/18 0306 11/04/18 0900 11/04/18 1139  BP: (!) 142/76 136/77 (!) 144/88 126/69  Pulse: 66 70 83 70  Resp: 15 17  16   Temp: 98.6 F (37 C) 98.9 F (37.2 C) 99 F (37.2 C) 98.8 F (37.1 C)  TempSrc: Oral Oral Oral Oral  SpO2: 98% 98% 99% 97%  Weight:      Height:        CBC:  Recent Labs  Lab 11/03/18 1600 11/04/18 0628  WBC 7.0 6.4  NEUTROABS 6.0  --   HGB 9.7* 9.2*  HCT 31.5* 29.0*  MCV 116.2* 112.4*  PLT 522* 515*    Basic Metabolic Panel:  Recent Labs  Lab 11/03/18 1600 11/03/18 1606 11/04/18 0628  NA 143  --  142  K 4.1  --  3.4*  CL 103  --  107  CO2 30  --  29  GLUCOSE 95  --  92  BUN 24*  --  16  CREATININE 0.84 2.20*  2.20* 0.66  CALCIUM 9.9  --  8.8*   Lipid Panel:     Component Value Date/Time   CHOL 132 11/04/2018 0628   TRIG 150 (H) 11/04/2018 0628   HDL 43 11/04/2018 0628   CHOLHDL 3.1 11/04/2018 0628   VLDL 30 11/04/2018 0628   LDLCALC 59 11/04/2018 0628   HgbA1c:  Lab Results  Component Value Date   HGBA1C 5.3 11/04/2018   Urine Drug Screen:     Component Value Date/Time   LABOPIA NONE DETECTED 11/03/2018 1751   COCAINSCRNUR NONE DETECTED 11/03/2018 1751   LABBENZ NONE DETECTED 11/03/2018 1751   AMPHETMU NONE DETECTED 11/03/2018 1751   THCU NONE DETECTED 11/03/2018 1751   LABBARB NONE DETECTED 11/03/2018 1751    Alcohol Level     Component Value Date/Time   ETH <10 11/03/2018 1630    IMAGING Mr Jodene Nam Head Wo Contrast  Result Date: 11/03/2018 CLINICAL DATA:  68 year old female code stroke with right side weakness and questionable hyperdense left MCA on plain head CT today. EXAM: MRI HEAD WITHOUT CONTRAST  LIMITED MRA HEAD WITHOUT CONTRAST TECHNIQUE: Limited pulse sequences of the brain and surrounding structures were obtained without intravenous contrast. Angiographic images of the head were obtained using MRA technique without contrast. COMPARISON:  Head CT 1606 hours today . FINDINGS: MRI HEAD FINDINGS DWI and axial FLAIR imaging only was obtained due to acute code stroke presentation. These reveal a small area of restricted diffusion in the posterior left corona radiata and left posterior lentiform along the margins of a chronic lacunar infarct (series 3, images 37 and 38). There is only mild associated FLAIR hyperintensity limited to the lentiform. No other No restricted diffusion or evidence of acute infarction. No midline shift, mass effect, or evidence of intracranial mass lesion. No evidence of intracranial hemorrhage. Scattered additional bilateral cerebral white matter FLAIR hyperintensity. MRA HEAD FINDINGS Antegrade flow in the posterior circulation with mildly dominant distal left vertebral artery. Highly tortuous vertebrobasilar junction but no distal vertebral stenosis. Both PICA origins remain normal. Tortuous basilar artery without stenosis. Normal SCA origins. Fetal type left PCA origin. Normal right PCA origin and posterior communicating artery. Bilateral  PCA branches are within normal limits. Antegrade flow in both ICA siphons. Tortuous cervical right ICA just below the skull base. Mild siphon dolichoectasia without stenosis. Ophthalmic and posterior communicating artery origins appear normal. Patent carotid termini. Normal MCA and ACA origins. Tortuous proximal ACAs. Anterior communicating artery is within normal limits. Visible ACA branches are within normal limits. Right MCA M1, bifurcation, and visible right MCA branches are within normal limits. Left MCA M1 segment is patent to the bifurcation without stenosis. Left MCA bifurcation and visible left MCA branches are within normal limits.  IMPRESSION: 1. Negative for large vessel occlusion. 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 2. Positive for acute small vessel infarct along the posterior margins of the chronic lacune of the left corona radiata and lentiform. No evidence of associated hemorrhage or mass effect. These results were communicated to Dr. Cheral Marker at 6:08 pmon 3/15/2020by text page via the [facility] messaging system. Electronically Signed   By: Genevie Ann M.D.   On: 11/03/2018 18:08   Mr Brain Wo Contrast  Result Date: 11/03/2018 CLINICAL DATA:  68 year old female code stroke with right side weakness and questionable hyperdense left MCA on plain head CT today. EXAM: MRI HEAD WITHOUT CONTRAST LIMITED MRA HEAD WITHOUT CONTRAST TECHNIQUE: Limited pulse sequences of the brain and surrounding structures were obtained without intravenous contrast. Angiographic images of the head were obtained using MRA technique without contrast. COMPARISON:  Head CT 1606 hours today . FINDINGS: MRI HEAD FINDINGS DWI and axial FLAIR imaging only was obtained due to acute code stroke presentation. These reveal a small area of restricted diffusion in the posterior left corona radiata and left posterior lentiform along the margins of a chronic lacunar infarct (series 3, images 37 and 38). There is only mild associated FLAIR hyperintensity limited to the lentiform. No other No restricted diffusion or evidence of acute infarction. No midline shift, mass effect, or evidence of intracranial mass lesion. No evidence of intracranial hemorrhage. Scattered additional bilateral cerebral white matter FLAIR hyperintensity. MRA HEAD FINDINGS Antegrade flow in the posterior circulation with mildly dominant distal left vertebral artery. Highly tortuous vertebrobasilar junction but no distal vertebral stenosis. Both PICA origins remain normal. Tortuous basilar artery without stenosis. Normal SCA origins. Fetal type left PCA origin. Normal right PCA origin and posterior  communicating artery. Bilateral PCA branches are within normal limits. Antegrade flow in both ICA siphons. Tortuous cervical right ICA just below the skull base. Mild siphon dolichoectasia without stenosis. Ophthalmic and posterior communicating artery origins appear normal. Patent carotid termini. Normal MCA and ACA origins. Tortuous proximal ACAs. Anterior communicating artery is within normal limits. Visible ACA branches are within normal limits. Right MCA M1, bifurcation, and visible right MCA branches are within normal limits. Left MCA M1 segment is patent to the bifurcation without stenosis. Left MCA bifurcation and visible left MCA branches are within normal limits. IMPRESSION: 1. Negative for large vessel occlusion. 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 2. Positive for acute small vessel infarct along the posterior margins of the chronic lacune of the left corona radiata and lentiform. No evidence of associated hemorrhage or mass effect. These results were communicated to Dr. Cheral Marker at 6:08 pmon 3/15/2020by text page via the [facility] messaging system. Electronically Signed   By: Genevie Ann M.D.   On: 11/03/2018 18:08   Ct Head Code Stroke Wo Contrast  Result Date: 11/03/2018 CLINICAL DATA:  Code stroke. 68 year old female with right side weakness. EXAM: CT HEAD WITHOUT CONTRAST TECHNIQUE: Contiguous axial images were obtained from the base  of the skull through the vertex without intravenous contrast. COMPARISON:  Paranasal sinus CT 08/26/2010. FINDINGS: Brain: Chronic appearing lacunar infarct of the left corona radiata is new since 2012 on series 3, image 20. Mild additional left hemisphere white matter hypodensity. Vascular: Mild Calcified atherosclerosis at the skull base. Mildly hyperdense left MCA in the sylvian fissure on series 6, images 40 and 41 is conspicuously asymmetric. Skull: No cortically based acute infarct identified. No midline shift, ventriculomegaly, mass effect, evidence of  mass lesion, intracranial hemorrhage or evidence of cortically based acute infarction. Sinuses/Orbits: Well pneumatized. Mild bubbly opacity in the right sphenoid. Tympanic cavities and mastoids are clear. Other: Visualized orbits and scalp soft tissues are within normal limits. ASPECTS [facility] Stroke Program Early CT Score) - Ganglionic level infarction (caudate, lentiform nuclei, internal capsule, insula, M1-M3 cortex): 7 - Supraganglionic infarction (M4-M6 cortex): 3 Total score (0-10 with 10 being normal): 10 IMPRESSION: 1. Suspicious left MCA density in the Sylvian fissure. But no CT changes of cortically based infarct. ASPECTS is 10.   No acute intracranial hemorrhage identified. 2. 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 3. These results were communicated to Dr. Cheral Marker at [facility] 3/15/2020by text page via the [facility] messaging system. Electronically Signed   By: Genevie Ann M.D.   On: 11/03/2018 16:17   Vas US Carotid (at [facility] Only)  Result Date: 11/04/2018 Carotid Arterial Duplex Study Indications:  CVA and Weakness. Risk Factors: Hypertension, hyperlipidemia. Performing Technologist: Sharion Dove RVS  Examination Guidelines: A complete evaluation includes B-mode imaging, spectral Doppler, color Doppler, and power Doppler as needed of all accessible portions of each vessel. Bilateral testing is considered an integral part of a complete examination. Limited examinations for reoccurring indications may be performed as noted.  Right Carotid Findings: +----------+--------+--------+--------+------------+--------+           PSV cm/sEDV cm/sStenosisDescribe    Comments +----------+--------+--------+--------+------------+--------+ CCA Prox  105     17              homogeneous          +----------+--------+--------+--------+------------+--------+ CCA Distal81      23              homogeneous           +----------+--------+--------+--------+------------+--------+ ICA Prox  86      27              heterogenous         +----------+--------+--------+--------+------------+--------+ ICA Distal69      23                                   +----------+--------+--------+--------+------------+--------+ ECA       81      17                                   +----------+--------+--------+--------+------------+--------+ +----------+--------+-------+--------+-------------------+           PSV cm/sEDV cmsDescribeArm Pressure (mmHG) +----------+--------+-------+--------+-------------------+ [id number]                                         +----------+--------+-------+--------+-------------------+ +---------+--------+--+--------+--+ VertebralPSV cm/s41EDV cm/s15 +---------+--------+--+--------+--+  Left Carotid Findings: +----------+--------+--------+--------+------------+--------+  PSV cm/sEDV cm/sStenosisDescribe    Comments +----------+--------+--------+--------+------------+--------+ CCA Prox  87      22              homogeneous          +----------+--------+--------+--------+------------+--------+ CCA Distal70      22              homogeneous          +----------+--------+--------+--------+------------+--------+ ICA Prox  82      33              heterogenous         +----------+--------+--------+--------+------------+--------+ ICA Distal118     43                                   +----------+--------+--------+--------+------------+--------+ ECA       93      18                                   +----------+--------+--------+--------+------------+--------+ +----------+--------+--------+--------+-------------------+ SubclavianPSV cm/sEDV cm/sDescribeArm Pressure (mmHG) +----------+--------+--------+--------+-------------------+           184                                          +----------+--------+--------+--------+-------------------+ +---------+--------+--+--------+--+ VertebralPSV cm/s77EDV cm/s19 +---------+--------+--+--------+--+  Summary: Right Carotid: Velocities in the right ICA are consistent with a 1-39% stenosis. Left Carotid: Velocities in the left ICA are consistent with a 1-39% stenosis. Vertebrals:  Bilateral vertebral arteries demonstrate antegrade flow. Subclavians: Normal flow hemodynamics were seen in bilateral subclavian              arteries. *See table(s) above for measurements and observations.     Preliminary    Vas [place] Lower Extremity Venous (dvt)  Result Date: 11/04/2018  Lower Venous Study Indications: Stroke.  Limitations: Bandages and body habitus. Comparison Study: No prior study on file Performing Technologist: Sharion Dove RVS  Examination Guidelines: A complete evaluation includes B-mode imaging, spectral Doppler, color Doppler, and power Doppler as needed of all accessible portions of each vessel. Bilateral testing is considered an integral part of a complete examination. Limited examinations for reoccurring indications may be performed as noted.  Right Venous Findings: +---------+---------------+---------+-----------+----------+------------------+          CompressibilityPhasicitySpontaneityPropertiesSummary            +---------+---------------+---------+-----------+----------+------------------+ CFV      Full                                                            +---------+---------------+---------+-----------+----------+------------------+ SFJ      Full                                                            +---------+---------------+---------+-----------+----------+------------------+ FV Prox  Full                                                            +---------+---------------+---------+-----------+----------+------------------+  FV Mid   Full                                                             +---------+---------------+---------+-----------+----------+------------------+ FV DistalFull                                                            +---------+---------------+---------+-----------+----------+------------------+ PFV      Full                                                            +---------+---------------+---------+-----------+----------+------------------+ POP      Full                                                            +---------+---------------+---------+-----------+----------+------------------+ PTV      Full                                         proximal portion                                                         only               +---------+---------------+---------+-----------+----------+------------------+ PERO                                                  Not visualized     +---------+---------------+---------+-----------+----------+------------------+ GSV      Full                                                            +---------+---------------+---------+-----------+----------+------------------+ Patient with Ardelia Mems boot  Right Technical Findings: Not visualized segments include peroneal vein and segments of the posterior tibial.  Left Venous Findings: +---------+---------------+---------+-----------+----------+-------+          CompressibilityPhasicitySpontaneityPropertiesSummary +---------+---------------+---------+-----------+----------+-------+ CFV      Full           Yes      Yes                          +---------+---------------+---------+-----------+----------+-------+ SFJ      Full                                                 +---------+---------------+---------+-----------+----------+-------+  FV Prox  Full                                                 +---------+---------------+---------+-----------+----------+-------+ FV Mid   Full                                                  +---------+---------------+---------+-----------+----------+-------+ FV DistalFull                                                 +---------+---------------+---------+-----------+----------+-------+ PFV      Full                                                 +---------+---------------+---------+-----------+----------+-------+ POP      Full           Yes      Yes                          +---------+---------------+---------+-----------+----------+-------+ PTV      Full                                                 +---------+---------------+---------+-----------+----------+-------+  Left Technical Findings: Not visualized segments include peroneal vein.   Summary: Right: There is no evidence of deep vein thrombosis in the lower extremity. However, portions of this examination were limited- see technologist comments above. Left: There is no evidence of deep vein thrombosis in the lower extremity. However, portions of this examination were limited- see technologist comments above.  *See table(s) above for measurements and observations.    Preliminary    Carotid Doppler   There is 1-39% bilateral ICA stenosis. Vertebral artery flow is antegrade.   LE doppler Right: There is no evidence of deep vein thrombosis in the lower extremity. However, portions of this examination were limited- see technologist comments above. Left: There is no evidence of deep vein thrombosis in the lower extremity. However, portions of this examination were limited- see technologist comments above  2D Echocardiogram   1. The left ventricle has normal systolic function, with an ejection fraction of 55-60%. The cavity size was normal. There is mild concentric left ventricular hypertrophy. Left ventricular diastolic parameters were normal. No evidence of left  ventricular regional wall motion abnormalities.  2. The right ventricle has normal systolic function. The cavity was  normal. There is no increase in right ventricular wall thickness. Right ventricular systolic pressure could not be assessed.  3. Left atrial size was mildly dilated.  4. Right atrial size was mildly dilated.  5. There is mild mitral annular calcification present.  6. The tricuspid valve is grossly normal.  7. The aortic valve has an indeterminate number of cusps Aortic valve regurgitation was not assessed by color flow Doppler.  8. No intracardiac  thrombi or masses were visualized.    PHYSICAL EXAM Present mildly obese middle-age Caucasian lady not in distress. . Afebrile. Head is nontraumatic. Neck is supple without bruit.    Cardiac exam no murmur or gallop. Lungs are clear to auscultation. Distal pulses are well felt. Neurological Exam :  Awake alert oriented 3 mild dysarthria but no aphasia. Right lower facial weakness. Tongue midline. Extraocular moments are full range without nystagmus. Blinks to threat bilaterally. Fundi not visualized. Motor system exam reveals mild right upper extremity drift. Significant weakness of right grip and intrinsic hand muscles. Symmetric lower extremity strength. Mild right finger-to-nose dysmetria. Touch pinprick sensation appear preserved bilaterally. Deep tendon reflexes are symmetric. Plantars are downgoing. Gait not tested. ASSESSMENT/PLAN [name] is a 68 y.o. female with history of PE on Eliquis, OSA, HTN, HLD presenting with R sided weakness.   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 Old L CR infarct new since 2012. ASPECTS 10.     MRI  Acute  infarct along L CR and LN chronic lacune  MRA  No LVO.  Carotid Doppler  B ICA 1-39% stenosis, VAs antegrade   2D Echo  EF 55-60%. No source of embolus   LE dopplers negative   LDL 59  HgbA1c 5.3  eliquis for VTE prophylaxis  Eliquis (apixaban) daily prior to  admission, now on aspirin 325 mg daily and Eliquis (apixaban) daily. Will decrease aspirin to 81 mg daily. Continue aspirin along with Eliquis at d/c given cardiac disease.  Therapy recommendations:  HH OT  Disposition:  pending   Follow-up Stroke Clinic at [facility] in 4 weeks. Office will call with appointment date and time. Order placed.  Hypertension  Stable . Permissive hypertension (OK if < 220/120) but gradually normalize in 5-7 days . Long-term BP goal normotensive  Lipids  Home meds:  No statin  LDL 59, at goal < 70  Placed on lipitor 40 on admission   Given acute stroke, continue lipitor 40 x 1 month then reassess  Other Stroke Risk Factors  Advanced age  Obesity, Body mass index is 31.24 kg/m., recommend weight loss, diet and exercise as appropriate   Obstructive sleep apnea  Hx DVT BLE  PE on Eliquis  Hx Wegner's granulomatosis - more associated with stroke in the young, more likely to be related to traditional risk factors, thought can be associated with small vessel disease  Other Active Problems  Chronic RLE ulcer  GERD  CKD stage IV  Hospital day # 1  Burnetta Sabin, MSN, APRN, ANVP-BC, AGPCNP-BC Advanced Practice Stroke Nurse [place] for Schedule & Pager information 11/04/2018 12:32 PM  I have personally obtained history,examined this patient, reviewed notes, independently viewed imaging studies, participated in medical decision making and plan of care.ROS completed by me personally and pertinent positives fully documented  I have made any additions or clarifications directly to the above note. 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 Recommend aspirin 81 mg in addition to eliquis which takes for pulmonary embolism  history. Physical occupational therapy consults. Continue ongoing stroke workup. Long discussion with the patient, husband and sister at the bedside and answered questions.discuss with Dr. [place] Crane. Greater than 50% time during this 35  minute visit was spent on counseling and coordination of care about her stroke and answering questions Stroke team will sign off. D/w Dr [place] Crane Antony Contras, MD Medical Director [place] Pager: [phone] 11/04/2018 3:26 PM  To contact Stroke Continuity provider, please refer to [email or web address] After hours, contact General Neurology

## 2018-11-04 NOTE — TOC Initial Note (Signed)
Transition of Care [facility]) - Initial/Assessment Note    Patient Details  Name: [name] MRN: [id number] Date of Birth: Aug 11, 1951  Transition of Care [facility]) CM/SW Contact:    Pollie Friar, RN Phone Number: 11/04/2018, 4:13 PM  Clinical Narrative:                   Expected Discharge Plan: [place] Barriers to Discharge: Continued Medical Work up   Patient Goals and CMS Choice     Choice offered to / list presented to : Patient  Expected Discharge Plan and Services Expected Discharge Plan: [place] Discharge Planning Services: CM Consult Post Acute Care Choice: [place] arrangements for the past 2 months: Single Family Home(2 levels)                     HH Arranged: PT, OT HH Agency: [place] ([facility])  Prior Living Arrangements/Services Living arrangements for the past 2 months: Single Family Home(2 levels) Lives with:: Spouse Patient language and need for interpreter reviewed:: Yes(no needs) Do you feel safe going back to the place where you live?: Yes      Need for Family Participation in Patient Care: Yes (Comment)(will need assist at home.) Care giver support system in place?: Yes (comment)(spouse) Current home services: DME(cane, walker, shower chair, wheelchair, 3 in 1) Criminal Activity/Legal Involvement Pertinent to Current Situation/Hospitalization: No - Comment as needed  Activities of Daily Living Home Assistive Devices/Equipment: None ADL Screening (condition at time of admission) Patient's cognitive ability adequate to safely complete daily activities?: Yes Is the patient deaf or have difficulty hearing?: No Does the patient have difficulty seeing, even when wearing glasses/contacts?: No Does the patient have difficulty concentrating, remembering, or making decisions?: No Patient able to express need for assistance with ADLs?: Yes Does the patient have difficulty dressing or  bathing?: No Independently performs ADLs?: Yes (appropriate for developmental age) Does the patient have difficulty walking or climbing stairs?: No Weakness of Legs: Right Weakness of Arms/Hands: Right  Permission Sought/Granted                  Emotional Assessment Appearance:: Appears stated age Attitude/Demeanor/Rapport: Lethargic Affect (typically observed): Unable to Assess(pt very sleepy) Orientation: : Oriented to Self(unsure of other orientation d/t sleepiness)   Psych Involvement: No (comment)  Admission diagnosis:  Cerebrovascular accident (CVA), unspecified mechanism ([place]) [I63.9] Patient Active Problem List   Diagnosis Date Noted  . Acute CVA (cerebrovascular accident) ([place]) 11/03/2018  . Ulcer of lower limb ([place]) 01/02/2014  . Chronic venous insufficiency 01/02/2014  . 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 Varicose veins of both lower extremities with complications 49/70/2637  . Wegener's granulomatosis with vasculitis ([place]) 07/21/2013  . Alveolar pneumopathy ([place]) 02/11/2011  . Lung mass 12/06/2010  . HEADACHE 08/24/2010  . RASH-NONVESICULAR 08/23/2010  . CELLULITIS 08/09/2010  . ANEMIA-IRON DEFICIENCY 02/16/2010  . CARDIAC MURMUR, AORTIC 01/10/2010  . POSTMENOPAUSAL SYNDROME 03/09/2009  . Hyperlipemia 12/07/2008  . PERNICIOUS ANEMIA 12/07/2008  . DEGENERATIVE DISC DISEASE, LUMBOSACRAL SPINE W/RADICULOPATHY 12/07/2008  . FASTING HYPERGLYCEMIA 12/07/2008  . IRON DEFICIENCY ANEMIA SECONDARY TO BLOOD LOSS 02/14/2008  . Essential hypertension 01/01/2008  . ALLERGIC RHINITIS 12/03/2007  . GERD 12/03/2007  . PAIN IN JOINT, SITE UNSPECIFIED 12/03/2007  . SLEEP APNEA 12/03/2007  . HIATAL HERNIA 02/23/2006  . Diverticulosis of colon (without mention of hemorrhage) 12/30/2002   PCP:  [name],  Elta Guadeloupe, MD Pharmacy:   [facility] [place], [place] [address] Phone: [phone] Fax: [phone]  [id number] - [place], [place] [address] [place] [address] [place] [address] Phone: [phone] Fax: [phone]     Social Determinants of Health (SDOH) Interventions  No issues with medications at home and no issues with transportation.   Readmission Risk Interventions 30 Day Unplanned Readmission Risk Score     ED to Hosp-Admission (Current) from 11/03/2018 in [facility]  30 Day Unplanned Readmission Risk Score (%)  11 Filed at 11/04/2018 1600     This score is the patient's risk of an unplanned readmission within 30 days of being discharged (0 -100%). The score is based on dignosis, age, lab data, medications, orders, and past utilization.   Low:  0-14.9   Medium: 15-21.9   High: 22-29.9   Extreme: 30 and above       No flowsheet data found.

## 2018-11-04 NOTE — Evaluation (Signed)
Physical Therapy Evaluation Patient Details Name: [name] MRN: [id number] DOB: May 09, 1951 Today's Date: 11/04/2018   History of Present Illness  Pt is a 68 y/o female with PMH of HTN, wegener's granulomatosis, DVT, obstructive sleep apnea, hx of PE presenting to ED with sudden weakness in R UE/LE. 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   Clinical Impression  Patient presents with decreased independence with mobility due to R sided weakness, decreased balance, decreased safety awareness, decreased activity tolerance and will benefit from skilled PT in the acute setting to allow d/c home with spouse assist following CIR level rehab stay.  Currently mod to min A overall and was independent PTA.  Spouse present and attentive and pt/family interested (pt reports had prior stay back in 2012.)    Follow Up Recommendations CIR;Supervision/Assistance - 24 hour    Equipment Recommendations  None recommended by PT    Recommendations for Other Services Rehab consult     Precautions / Restrictions Precautions Precautions: Fall Precaution Comments: R side weakness      Mobility  Bed Mobility Overal bed mobility: Needs Assistance Bed Mobility: Supine to Sit     Supine to sit: Min assist     General bed mobility comments: increased time, assist for trunk  Transfers Overall transfer level: Needs assistance Equipment used: Rolling walker (2 wheeled) Transfers: Sit to/from Stand Sit to Stand: Mod assist         General transfer comment: lifting assist from EOB and from low commode in bathroom  Ambulation/Gait Ambulation/Gait assistance: Min assist Gait Distance (Feet): 80 Feet Assistive device: Rolling walker (2 wheeled) Gait Pattern/deviations: Step-to pattern;Step-through pattern;Decreased dorsiflexion - right;Decreased stride length;Shuffle;Trunk flexed     General Gait Details:  skims R foot on floor with her slippers on, able to lift with cues for couple of steps, but degrades back to shuffling foot, assist for safety with walker as R hand weaker, needs assist to keep straight path  Stairs            Wheelchair Mobility    Modified Rankin (Stroke Patients Only) Modified Rankin (Stroke Patients Only) Pre-Morbid Rankin Score: No symptoms Modified Rankin: Moderately severe disability     Balance Overall balance assessment: Needs assistance Sitting-balance support: Feet supported Sitting balance-Leahy Scale: Fair Sitting balance - Comments: sitting bending to reach to wash legs after urinary incontinence in standing min a for dynamic balance   Standing balance support: No upper extremity supported;During functional activity Standing balance-Leahy Scale: Poor Standing balance comment: assist for balance in standing to wash hands                             Pertinent Vitals/Pain Pain Assessment: No/denies pain    Home Living Family/patient expects to be discharged to:: Inpatient rehab Living Arrangements: Spouse/significant other Available Help at Discharge: Family;Available 24 hours/day Type of Home: House Home Access: Stairs to enter Entrance Stairs-Rails: Left Entrance Stairs-Number of Steps: 5 Home Layout: Two level;Able to live on main level with bedroom/bathroom Home Equipment: Gilford Rile - 2 wheels;Cane - single point;Bedside commode;Shower seat      Prior Function Level of Independence: Independent         Comments: reports independent ADLs, IADLs, driving      Hand Dominance   Dominant Hand: Left    Extremity/Trunk Assessment   Upper Extremity Assessment Upper Extremity Assessment:  Defer to OT evaluation    Lower Extremity Assessment Lower Extremity Assessment: RLE deficits/detail RLE Deficits / Details: AROM WFL, strength grossly 3/5, but functionally drags foot on floor and takes increased time to move over liip  of bathroom door, etc    Cervical / Trunk Assessment Cervical / Trunk Assessment: Kyphotic  Communication   Communication: No difficulties  Cognition Arousal/Alertness: Awake/alert Behavior During Therapy: Flat affect Overall Cognitive Status: Impaired/Different from baseline Area of Impairment: Attention;Memory;Safety/judgement;Following commands                   Current Attention Level: Sustained Memory: Decreased recall of precautions Following Commands: Follows one step commands consistently;Follows one step commands with increased time Safety/Judgement: Decreased awareness of safety   Problem Solving: Slow processing;Decreased initiation;Requires verbal cues        General Comments General comments (skin integrity, edema, etc.): spouse present and assisting in pt care    Exercises     Assessment/Plan    PT Assessment Patient needs continued PT services  PT Problem List Decreased strength;Decreased balance;Decreased mobility;Decreased safety awareness;Decreased knowledge of use of DME;Decreased coordination       PT Treatment Interventions DME instruction;Therapeutic exercise;Balance training;Gait training;Neuromuscular re-education;Stair training;Functional mobility training;Therapeutic activities;Patient/family education    PT Goals (Current goals can be found in the Care Plan section)  Acute Rehab PT Goals Patient Stated Goal: to return to independent PT Goal Formulation: With patient/family Time For Goal Achievement: 11/18/18 Potential to Achieve Goals: Good    Frequency Min 4X/week   Barriers to discharge        Co-evaluation               AM-PAC PT "6 Clicks" Mobility  Outcome Measure Help needed turning from your back to your side while in a flat bed without using bedrails?: A Little Help needed moving from lying on your back to sitting on the side of a flat bed without using bedrails?: A Lot Help needed moving to and from a bed to a  chair (including a wheelchair)?: A Little Help needed standing up from a chair using your arms (e.g., wheelchair or bedside chair)?: A Lot Help needed to walk in hospital room?: A Little Help needed climbing 3-5 steps with a railing? : A Lot 6 Click Score: 15    End of Session Equipment Utilized During Treatment: Gait belt Activity Tolerance: Patient tolerated treatment well Patient left: in bed;with call bell/phone within reach;with family/visitor present(sitting EOB)   PT Visit Diagnosis: Other abnormalities of gait and mobility (R26.89);Hemiplegia and hemiparesis Hemiplegia - Right/Left: Right Hemiplegia - dominant/non-dominant: Non-dominant Hemiplegia - caused by: Cerebral infarction    Time: 1440-1519 PT Time Calculation (min) (ACUTE ONLY): 39 min   Charges:   PT Evaluation $PT Eval Moderate Complexity: 1 Mod PT Treatments $Gait Training: 8-22 mins $Therapeutic Activity: 8-22 mins        Magda Kiel, [place] Acute Rehabilitation Services [phone] 11/04/2018   Reginia Naas 11/04/2018, 4:15 PM

## 2018-11-04 NOTE — Progress Notes (Signed)
VASCULAR LAB PRELIMINARY  PRELIMINARY  PRELIMINARY  PRELIMINARY  Carotid duplex completed.    Preliminary report:  See CV proc for preliminary results  [name], RVT 11/04/2018, 9:40 AM

## 2018-11-04 NOTE — Progress Notes (Signed)
VASCULAR LAB PRELIMINARY  PRELIMINARY  PRELIMINARY  PRELIMINARY  Bilateral lower extremity venous duplex completed.    Preliminary report:  See CV proc for preliminary results.  Osmany Azer, RVT 11/04/2018, 12:20 PM

## 2018-11-04 NOTE — Progress Notes (Addendum)
PROGRESS NOTE  [name] XBM:[id number] DOB: [birth year] DOA: 11/03/2018 PCP: Crist Infante, MD  HPI/Recap of past 24 hours: [name] is a 67 y.o. female with medical history significant of hypertension, hyperlipidemia, GERD, Wegener's granulomatosis, DVT, obstructive sleep apnea, history of PE on Eliquis who was doing well until this morning when she woke up fine.  Around 3:00 patient noticed sudden weakness in her right arm and leg.  She could not get up from a table.  EMS was called and patient was brought to the ER.  A code stroke was called for but patient was not a candidate for TPA due to being on Eliquis.  On the way to the hospital EMS noted a right facial droop.  She also has right-sided weakness.  Patient has not had any previous stroke but have family history of strokes in her dad.  She has been evaluated by neurology and at this point being admitted after MRI showed an acute stroke.  Patient still has residual weakness on the right.  She is able to speak without difficulty.   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  No hemorrhage.  MRA shows no large vessel occlusion.  Patient has been seen by neurology and is being admitted for stroke work-up.  11/04/18: Patient seen and examined at bedside.  No acute events overnight.  She has no new complaints.  Bilateral carotid Doppler ultrasound unremarkable for any significant stenosis.  Ongoing stroke work-up.  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 Presented with sudden onset of right-sided  weakness MRA and MRI of head with suspicion for left MCA Ongoing stroke work-up Bilateral carotid Doppler ultrasound unremarkable for any significant stenosis LDL and A1c at goal 2D echo pending Neurology consulted and following Continue Lipitor 40 mg daily Already on Eliquis for history of DVT and PE Continue PT OT Fall precautions  Hypokalemia Potassium 3.4 Repleted with p.o. KCl Repeat BMP in the morning  Essential hypertension: Continue with home regimen of blood pressure medications.  Chronic right lower extremity ulcer: Patient goes to the wound care at [facility].  She is due for visit on Tuesday.  Wound care consult if patient is still in the hospital.  GERD: Continue with PPIs.  History of Wegener's granulomatosis: Continue with home regimen unchanged.  Chronic depression/anxiety Continue Cymbalta  History of DVT/PE Continue Eliquis  Ambulatory dysfunction post stroke OT recommends home health OT with 24-hour supervision assistance Fall precautions    DVT prophylaxis: Heparin Code Status: Full code Family Communication: Sister at bedside Disposition Plan: To be determined Consults called: Neurology Dr. Cheral Marker      Objective: Vitals:   11/03/18 2300 11/04/18 0200 11/04/18 0306 11/04/18 0900  BP: (!) 147/88 (!) 142/76 136/77 (!) 144/88  Pulse:  66 70 83  Resp:  15 17   Temp: 97.9 F (36.6 C) 98.6 F (37 C) 98.9 F (37.2 C) 99 F (37.2 C)  TempSrc: Oral Oral Oral Oral  SpO2: 96% 98% 98% 99%  Weight:      Height:        Intake/Output Summary (Last  24 hours) at 11/04/2018 1126 Last data filed at 11/04/2018 0300 Gross per 24 hour  Intake 935.58 ml  Output 500 ml  Net 435.58 ml   Filed Weights   11/03/18 1629 11/03/18 2100  Weight: 83 kg 80 kg    Exam:  . General: 68 y.o. year-old female well developed well nourished in no acute distress.  Alert and oriented x3. . Cardiovascular: Regular rate and rhythm with no rubs or gallops.  No  thyromegaly or JVD noted.   Marland Kitchen Respiratory: Clear to auscultation with no wheezes or rales. Good inspiratory effort. . Abdomen: Soft nontender nondistended with normal bowel sounds x4 quadrants. . Psychiatry: Mood is appropriate for condition and setting   Data Reviewed: CBC: Recent Labs  Lab 11/03/18 1600 11/04/18 0628  WBC 7.0 6.4  NEUTROABS 6.0  --   HGB 9.7* 9.2*  HCT 31.5* 29.0*  MCV 116.2* 112.4*  PLT 522* 664*   Basic Metabolic Panel: Recent Labs  Lab 11/03/18 1600 11/03/18 1606 11/04/18 0628  NA 143  --  142  K 4.1  --  3.4*  CL 103  --  107  CO2 30  --  29  GLUCOSE 95  --  92  BUN 24*  --  16  CREATININE 0.84 2.20*  2.20* 0.66  CALCIUM 9.9  --  8.8*   GFR: Estimated Creatinine Clearance: 68.3 mL/min (by C-G formula based on SCr of 0.66 mg/dL). Liver Function Tests: Recent Labs  Lab 11/03/18 1600  AST 23  ALT 12  ALKPHOS 76  BILITOT 0.3  PROT 7.2  ALBUMIN 3.6   No results for input(s): LIPASE, AMYLASE in the last 168 hours. No results for input(s): AMMONIA in the last 168 hours. Coagulation Profile: Recent Labs  Lab 11/03/18 1600  INR 1.0   Cardiac Enzymes: No results for input(s): CKTOTAL, CKMB, CKMBINDEX, TROPONINI in the last 168 hours. BNP (last 3 results) No results for input(s): PROBNP in the last 8760 hours. HbA1C: Recent Labs    11/04/18 0628  HGBA1C 5.3   CBG: No results for input(s): GLUCAP in the last 168 hours. Lipid Profile: Recent Labs    11/04/18 0628  CHOL 132  HDL 43  LDLCALC 59  TRIG 150*  CHOLHDL 3.1   Thyroid Function Tests: No results for input(s): TSH, T4TOTAL, FREET4, T3FREE, THYROIDAB in the last 72 hours. Anemia Panel: No results for input(s): VITAMINB12, FOLATE, FERRITIN, TIBC, IRON, RETICCTPCT in the last 72 hours. Urine analysis:    Component Value Date/Time   COLORURINE YELLOW 11/03/2018 1751   APPEARANCEUR CLOUDY (A) 11/03/2018 1751   LABSPEC 1.014 11/03/2018 1751   LABSPEC 1.015 01/03/2011  1602   PHURINE 9.0 (H) 11/03/2018 1751   GLUCOSEU NEGATIVE 11/03/2018 1751   HGBUR NEGATIVE 11/03/2018 1751   HGBUR negative 08/23/2010 1536   BILIRUBINUR NEGATIVE 11/03/2018 1751   BILIRUBINUR N 10/29/2014 1125   BILIRUBINUR Negative 01/03/2011 [place] 11/03/2018 1751   PROTEINUR NEGATIVE 11/03/2018 1751   UROBILINOGEN negative 10/29/2014 1125   UROBILINOGEN 0.2 03/07/2011 2045   NITRITE NEGATIVE 11/03/2018 1751   LEUKOCYTESUR NEGATIVE 11/03/2018 1751   LEUKOCYTESUR Negative 01/03/2011 1602   Sepsis Labs: @LABRCNTIP (procalcitonin:4,lacticidven:4)  )No results found for this or any previous visit (from the past 240 hour(s)).    Studies: Mr Virgel Paling QI Contrast  Result Date: 11/03/2018 CLINICAL DATA:  68 year old female code stroke with right side weakness and questionable hyperdense left MCA on plain head CT today. EXAM: MRI HEAD WITHOUT  CONTRAST LIMITED MRA HEAD WITHOUT CONTRAST TECHNIQUE: Limited pulse sequences of the brain and surrounding structures were obtained without intravenous contrast. Angiographic images of the head were obtained using MRA technique without contrast. COMPARISON:  Head CT 1606 hours today . FINDINGS: MRI HEAD FINDINGS DWI and axial FLAIR imaging only was obtained due to acute code stroke presentation. These reveal a small area of restricted diffusion in the posterior left corona radiata and left posterior lentiform along the margins of a chronic lacunar infarct (series 3, images 37 and 38). There is only mild associated FLAIR hyperintensity limited to the lentiform. No other No restricted diffusion or evidence of acute infarction. No midline shift, mass effect, or evidence of intracranial mass lesion. No evidence of intracranial hemorrhage. Scattered additional bilateral cerebral white matter FLAIR hyperintensity. MRA HEAD FINDINGS Antegrade flow in the posterior circulation with mildly dominant distal left vertebral artery. Highly tortuous  vertebrobasilar junction but no distal vertebral stenosis. Both PICA origins remain normal. Tortuous basilar artery without stenosis. Normal SCA origins. Fetal type left PCA origin. Normal right PCA origin and posterior communicating artery. Bilateral PCA branches are within normal limits. Antegrade flow in both ICA siphons. Tortuous cervical right ICA just below the skull base. Mild siphon dolichoectasia without stenosis. Ophthalmic and posterior communicating artery origins appear normal. Patent carotid termini. Normal MCA and ACA origins. Tortuous proximal ACAs. Anterior communicating artery is within normal limits. Visible ACA branches are within normal limits. Right MCA M1, bifurcation, and visible right MCA branches are within normal limits. Left MCA M1 segment is patent to the bifurcation without stenosis. Left MCA bifurcation and visible left MCA branches are within normal limits. IMPRESSION: 1. Negative for large vessel occlusion. 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 2. Positive for acute small vessel infarct along the posterior margins of the chronic lacune of the left corona radiata and lentiform. No evidence of associated hemorrhage or mass effect. These results were communicated to Dr. Cheral Marker at 6:08 pmon 3/15/2020by text page via the [facility] messaging system. Electronically Signed   By: Genevie Ann M.D.   On: 11/03/2018 18:08   Mr Brain Wo Contrast  Result Date: 11/03/2018 CLINICAL DATA:  68 year old female code stroke with right side weakness and questionable hyperdense left MCA on plain head CT today. EXAM: MRI HEAD WITHOUT CONTRAST LIMITED MRA HEAD WITHOUT CONTRAST TECHNIQUE: Limited pulse sequences of the brain and surrounding structures were obtained without intravenous contrast. Angiographic images of the head were obtained using MRA technique without contrast. COMPARISON:  Head CT 1606 hours today . FINDINGS: MRI HEAD FINDINGS DWI and axial FLAIR imaging only was obtained due to  acute code stroke presentation. These reveal a small area of restricted diffusion in the posterior left corona radiata and left posterior lentiform along the margins of a chronic lacunar infarct (series 3, images 37 and 38). There is only mild associated FLAIR hyperintensity limited to the lentiform. No other No restricted diffusion or evidence of acute infarction. No midline shift, mass effect, or evidence of intracranial mass lesion. No evidence of intracranial hemorrhage. Scattered additional bilateral cerebral white matter FLAIR hyperintensity. MRA HEAD FINDINGS Antegrade flow in the posterior circulation with mildly dominant distal left vertebral artery. Highly tortuous vertebrobasilar junction but no distal vertebral stenosis. Both PICA origins remain normal. Tortuous basilar artery without stenosis. Normal SCA origins. Fetal type left PCA origin. Normal right PCA origin and posterior communicating artery. Bilateral PCA branches are within normal limits. Antegrade flow in both ICA siphons. Tortuous cervical right ICA just below  the skull base. Mild siphon dolichoectasia without stenosis. Ophthalmic and posterior communicating artery origins appear normal. Patent carotid termini. Normal MCA and ACA origins. Tortuous proximal ACAs. Anterior communicating artery is within normal limits. Visible ACA branches are within normal limits. Right MCA M1, bifurcation, and visible right MCA branches are within normal limits. Left MCA M1 segment is patent to the bifurcation without stenosis. Left MCA bifurcation and visible left MCA branches are within normal limits. IMPRESSION: 1. Negative for large vessel occlusion. 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 2. Positive for acute small vessel infarct along the posterior margins of the chronic lacune of the left corona radiata and lentiform. No evidence of associated hemorrhage or mass effect. These results were communicated to Dr. Cheral Marker at 6:08 pmon  3/15/2020by text page via the [facility] messaging system. Electronically Signed   By: Genevie Ann M.D.   On: 11/03/2018 18:08   Ct Head Code Stroke Wo Contrast  Result Date: 11/03/2018 CLINICAL DATA:  Code stroke. 68 year old female with right side weakness. EXAM: CT HEAD WITHOUT CONTRAST TECHNIQUE: Contiguous axial images were obtained from the base of the skull through the vertex without intravenous contrast. COMPARISON:  Paranasal sinus CT 08/26/2010. FINDINGS: Brain: Chronic appearing lacunar infarct of the left corona radiata is new since 2012 on series 3, image 20. Mild additional left hemisphere white matter hypodensity. Vascular: Mild Calcified atherosclerosis at the skull base. Mildly hyperdense left MCA in the sylvian fissure on series 6, images 40 and 41 is conspicuously asymmetric. Skull: No cortically based acute infarct identified. No midline shift, ventriculomegaly, mass effect, evidence of mass lesion, intracranial hemorrhage or evidence of cortically based acute infarction. Sinuses/Orbits: Well pneumatized. Mild bubbly opacity in the right sphenoid. Tympanic cavities and mastoids are clear. Other: Visualized orbits and scalp soft tissues are within normal limits. ASPECTS [facility] Stroke Program Early CT Score) - Ganglionic level infarction (caudate, lentiform nuclei, internal capsule, insula, M1-M3 cortex): 7 - Supraganglionic infarction (M4-M6 cortex): 3 Total score (0-10 with 10 being normal): 10 IMPRESSION: 1. Suspicious left MCA density in the Sylvian fissure. But no CT changes of cortically based infarct. ASPECTS is 10.   No acute intracranial hemorrhage identified. 2. 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 3. These results were communicated to Dr. Cheral Marker at [place] 3/15/2020by text page via the [facility] messaging system. Electronically Signed   By: Genevie Ann M.D.   On: 11/03/2018 16:17   Vas US Carotid (at [place] Only)  Result Date: 11/04/2018 Carotid  Arterial Duplex Study Indications:  CVA and Weakness. Risk Factors: Hypertension, hyperlipidemia. Performing Technologist: Sharion Dove RVS  Examination Guidelines: A complete evaluation includes B-mode imaging, spectral Doppler, color Doppler, and power Doppler as needed of all accessible portions of each vessel. Bilateral testing is considered an integral part of a complete examination. Limited examinations for reoccurring indications may be performed as noted.  Right Carotid Findings: +----------+--------+--------+--------+------------+--------+           PSV cm/sEDV cm/sStenosisDescribe    Comments +----------+--------+--------+--------+------------+--------+ CCA Prox  105     17              homogeneous          +----------+--------+--------+--------+------------+--------+ CCA Distal81      23              homogeneous          +----------+--------+--------+--------+------------+--------+ ICA Prox  86      27  heterogenous         +----------+--------+--------+--------+------------+--------+ ICA Distal69      23                                   +----------+--------+--------+--------+------------+--------+ ECA       81      17                                   +----------+--------+--------+--------+------------+--------+ +----------+--------+-------+--------+-------------------+           PSV cm/sEDV cmsDescribeArm Pressure (mmHG) +----------+--------+-------+--------+-------------------+ Subclavian69                                         +----------+--------+-------+--------+-------------------+ +---------+--------+--+--------+--+ VertebralPSV cm/s41EDV cm/s15 +---------+--------+--+--------+--+  Left Carotid Findings: +----------+--------+--------+--------+------------+--------+           PSV cm/sEDV cm/sStenosisDescribe    Comments +----------+--------+--------+--------+------------+--------+ CCA Prox  87      22               homogeneous          +----------+--------+--------+--------+------------+--------+ CCA Distal70      22              homogeneous          +----------+--------+--------+--------+------------+--------+ ICA Prox  82      33              heterogenous         +----------+--------+--------+--------+------------+--------+ ICA Distal118     43                                   +----------+--------+--------+--------+------------+--------+ ECA       93      18                                   +----------+--------+--------+--------+------------+--------+ +----------+--------+--------+--------+-------------------+ SubclavianPSV cm/sEDV cm/sDescribeArm Pressure (mmHG) +----------+--------+--------+--------+-------------------+           184                                         +----------+--------+--------+--------+-------------------+ +---------+--------+--+--------+--+ VertebralPSV cm/s77EDV cm/s19 +---------+--------+--+--------+--+  Summary: Right Carotid: Velocities in the right ICA are consistent with a 1-39% stenosis. Left Carotid: Velocities in the left ICA are consistent with a 1-39% stenosis. Vertebrals:  Bilateral vertebral arteries demonstrate antegrade flow. Subclavians: Normal flow hemodynamics were seen in bilateral subclavian              arteries. *See table(s) above for measurements and observations.     Preliminary     Scheduled Meds: . apixaban  2.5 mg Oral BID  . [START ON 11/05/2018] aspirin EC  81 mg Oral Daily  . atorvastatin  40 mg Oral q1800  . azaTHIOprine  100 mg Oral Daily  . calcium carbonate  1,250 mg Oral Q breakfast  . cholecalciferol  1,000 Units Oral BID  . DULoxetine  60 mg Oral QHS  . gabapentin  600 mg Oral BID  . hydroxyurea  1,000 mg Oral Daily  And  . hydroxyurea  500 mg Oral QHS  . lisinopril  10 mg Oral Daily  . multivitamin with minerals  1 tablet Oral Daily  . potassium chloride  40 mEq Oral Once  . predniSONE  5  mg Oral Q breakfast    Continuous Infusions: . sodium chloride 75 mL/hr at 11/04/18 0300     LOS: 1 day     Kayleen Memos, MD [facility] Pager [phone]  If 7PM-7AM, please contact night-coverage [email or web address] Password [facility] 11/04/2018, 11:26 AM

## 2018-11-04 NOTE — Evaluation (Signed)
Speech Language Pathology Evaluation Patient Details Name: [name] MRN: [id number] DOB: 06/09/1951 Today's Date: 11/04/2018 Time: 4656-8127 SLP Time Calculation (min) (ACUTE ONLY): 30 min  Problem List:  Patient Active Problem List   Diagnosis Date Noted  . Acute CVA (cerebrovascular accident) ([place]) 11/03/2018  . Ulcer of lower limb ([place]) 01/02/2014  . Chronic venous insufficiency 01/02/2014  . 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 Varicose veins of both lower extremities with complications 51/70/0174  . 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 Lung mass 12/06/2010  . HEADACHE 08/24/2010  . RASH-NONVESICULAR 08/23/2010  . CELLULITIS 08/09/2010  . ANEMIA-IRON DEFICIENCY 02/16/2010  . CARDIAC MURMUR, AORTIC 01/10/2010  . POSTMENOPAUSAL SYNDROME 03/09/2009  . Hyperlipemia 12/07/2008  . PERNICIOUS ANEMIA 12/07/2008  . DEGENERATIVE DISC DISEASE, LUMBOSACRAL SPINE W/RADICULOPATHY 12/07/2008  . FASTING HYPERGLYCEMIA 12/07/2008  . IRON DEFICIENCY ANEMIA SECONDARY TO BLOOD LOSS 02/14/2008  . Essential hypertension 01/01/2008  . ALLERGIC RHINITIS 12/03/2007  . GERD 12/03/2007  . PAIN IN JOINT, SITE UNSPECIFIED 12/03/2007  . SLEEP APNEA 12/03/2007  . HIATAL HERNIA 02/23/2006  . Diverticulosis of colon (without mention of hemorrhage) 12/30/2002   Past Medical History:  Past Medical History:  Diagnosis Date  . Allergic rhinitis   . Anemia    recurrent iron defic.  Marland Kitchen Cervical polyp 03/2003  . DDD (degenerative disc disease)   . Depression    Dr. Toy Care (situational, divorce, loss of parents)  . Diverticulosis   . DVT (deep venous thrombosis) (HCC)    both legs  . Esophageal stricture   . GERD (gastroesophageal reflux disease)   . Hiatal hernia   . Hyperlipidemia   . Hypertension   . Hypertension   . Neuropathy, peripheral   . OSA (obstructive sleep  apnea)   . Osteoarthritis   . Pulmonary embolism ([place])    left lung  . Wegner's disease (congenital syphilitic osteochondritis) 2012   DR. Ginger Organ at [facility]   Past Surgical History:  Past Surgical History:  Procedure Laterality Date  . back injection     steroid injection x2  . BRONCHOSCOPY  april 2012  . iron infusion  11/14   seeing hematologist at [facility]  . lung biospy  may 2012  . PARTIAL HIP ARTHROPLASTY Left 03/2014   HPI:  Pt is a 68 y.o. female with medical history significant of hypertension, hyperlipidemia, GERD, Wegener's granulomatosis, DVT, obstructive sleep apnea, history of PE on Eliquis, presenting to ED with R arm and leg weakness, R facial droop. 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 No evidence of associated hemorrhage or mass effect.   Assessment / Plan / Recommendation Clinical Impression  Pt presents with suspected cognitive deficits secondary to CVA and marked by executive function, attention and memory deficits. Both pt and sister in room report no concerns for speech or cognition since stroke. Pt lives at home with husband and mainly cooks, gardens and goes to social functions. Hoarse and breathy vocal quality is a result of baseline Wegener's Granulomatosis, per pt report .Speech is slightly dysarthric due to R facial droop, but overall is 90-100% intelligible at the conversation level. Portions of Western Aphasia Battery: bedside revealed no indication of expressive or receptive langauge impairment. Portions of MOCA administered with observed deficits in executive function (clock drawing), memory and attention. Given time constraints, will f/u to  determine pt's function in more functional tasks and determine if SLP services are warranted at this time.     SLP Assessment  SLP Recommendation/Assessment: Patient needs continued Speech Lanaguage Pathology  Services SLP Visit Diagnosis: Cognitive communication deficit (R41.841)    Follow Up Recommendations  Home health SLP;24 hour supervision/assistance    Frequency and Duration min 2x/week         SLP Evaluation Cognition  Overall Cognitive Status: Impaired/Different from baseline Arousal/Alertness: Awake/alert Orientation Level: Oriented X4 Attention: Sustained Sustained Attention: Appears intact Memory: Appears intact Awareness: Appears intact Problem Solving: Appears intact Safety/Judgment: Appears intact       Comprehension  Auditory Comprehension Overall Auditory Comprehension: Appears within functional limits for tasks assessed Yes/No Questions: Within Functional Limits Commands: Within Functional Limits Conversation: Complex Visual Recognition/Discrimination Discrimination: Within Function Limits Reading Comprehension Reading Status: Not tested    Expression Expression Primary Mode of Expression: Verbal Verbal Expression Overall Verbal Expression: Appears within functional limits for tasks assessed Initiation: No impairment Automatic Speech: Name;Social Response;Month of year Level of Generative/Spontaneous Verbalization: Conversation Repetition: No impairment Naming: No impairment Pragmatics: No impairment Written Expression Dominant Hand: Left Written Expression: Not tested   Oral / Motor  Oral Motor/Sensory Function Overall Oral Motor/Sensory Function: Mild impairment Facial Symmetry: Abnormal symmetry right Motor Speech Overall Motor Speech: Impaired Respiration: Within functional limits Phonation: Hoarse Resonance: Within functional limits Articulation: Within functional limitis Intelligibility: Intelligibility reduced Conversation: 75-100% accurate Motor Planning: Witnin functional limits   GO                    Ellis Savage, Student SLP 11/04/2018, 12:44 PM

## 2018-11-05 ENCOUNTER — Other Ambulatory Visit: Payer: Self-pay

## 2018-11-05 ENCOUNTER — Inpatient Hospital Stay (HOSPITAL_COMMUNITY)
Admission: RE | Admit: 2018-11-05 | Discharge: 2018-11-08 | DRG: 057 | Disposition: A | Discharge status: Home Health | Payer: Medicare Other | Source: Intra-hospital | Attending: Physical Medicine & Rehabilitation | Admitting: Physical Medicine & Rehabilitation

## 2018-11-05 ENCOUNTER — Ambulatory Visit: Payer: Medicare Other | Admitting: Physician Assistant

## 2018-11-05 ENCOUNTER — Encounter (HOSPITAL_COMMUNITY): Payer: Self-pay

## 2018-11-05 DIAGNOSIS — Z7901 Long term (current) use of anticoagulants: Secondary | ICD-10-CM | POA: Diagnosis not present

## 2018-11-05 DIAGNOSIS — Z8249 Family history of ischemic heart disease and other diseases of the circulatory system: Secondary | ICD-10-CM | POA: Diagnosis not present

## 2018-11-05 DIAGNOSIS — Z7982 Long term (current) use of aspirin: Secondary | ICD-10-CM

## 2018-11-05 DIAGNOSIS — I10 Essential (primary) hypertension: Secondary | ICD-10-CM | POA: Diagnosis present

## 2018-11-05 DIAGNOSIS — D649 Anemia, unspecified: Secondary | ICD-10-CM | POA: Diagnosis present

## 2018-11-05 DIAGNOSIS — Z7952 Long term (current) use of systemic steroids: Secondary | ICD-10-CM

## 2018-11-05 DIAGNOSIS — K219 Gastro-esophageal reflux disease without esophagitis: Secondary | ICD-10-CM | POA: Diagnosis present

## 2018-11-05 DIAGNOSIS — I69392 Facial weakness following cerebral infarction: Secondary | ICD-10-CM

## 2018-11-05 DIAGNOSIS — E785 Hyperlipidemia, unspecified: Secondary | ICD-10-CM | POA: Diagnosis present

## 2018-11-05 DIAGNOSIS — I69351 Hemiplegia and hemiparesis following cerebral infarction affecting right dominant side: Principal | ICD-10-CM

## 2018-11-05 DIAGNOSIS — G4700 Insomnia, unspecified: Secondary | ICD-10-CM | POA: Diagnosis present

## 2018-11-05 DIAGNOSIS — Z86711 Personal history of pulmonary embolism: Secondary | ICD-10-CM | POA: Diagnosis not present

## 2018-11-05 DIAGNOSIS — I69328 Other speech and language deficits following cerebral infarction: Secondary | ICD-10-CM

## 2018-11-05 DIAGNOSIS — Z823 Family history of stroke: Secondary | ICD-10-CM | POA: Diagnosis not present

## 2018-11-05 DIAGNOSIS — I6381 Other cerebral infarction due to occlusion or stenosis of small artery: Secondary | ICD-10-CM | POA: Diagnosis not present

## 2018-11-05 DIAGNOSIS — G4733 Obstructive sleep apnea (adult) (pediatric): Secondary | ICD-10-CM | POA: Diagnosis present

## 2018-11-05 DIAGNOSIS — Z79899 Other long term (current) drug therapy: Secondary | ICD-10-CM | POA: Diagnosis not present

## 2018-11-05 DIAGNOSIS — Z833 Family history of diabetes mellitus: Secondary | ICD-10-CM | POA: Diagnosis not present

## 2018-11-05 DIAGNOSIS — L97919 Non-pressure chronic ulcer of unspecified part of right lower leg with unspecified severity: Secondary | ICD-10-CM | POA: Diagnosis present

## 2018-11-05 DIAGNOSIS — Z8679 Personal history of other diseases of the circulatory system: Secondary | ICD-10-CM

## 2018-11-05 DIAGNOSIS — G8191 Hemiplegia, unspecified affecting right dominant side: Secondary | ICD-10-CM | POA: Diagnosis not present

## 2018-11-05 DIAGNOSIS — Z96642 Presence of left artificial hip joint: Secondary | ICD-10-CM | POA: Diagnosis present

## 2018-11-05 DIAGNOSIS — M313 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: Secondary | ICD-10-CM | POA: Diagnosis present

## 2018-11-05 DIAGNOSIS — K5900 Constipation, unspecified: Secondary | ICD-10-CM | POA: Diagnosis present

## 2018-11-05 HISTORY — DX: Other cerebral infarction due to occlusion or stenosis of small artery: I63.81

## 2018-11-05 LAB — BASIC METABOLIC PANEL
Anion gap: 7 (ref 5–15)
BUN: 13 mg/dL (ref 8–23)
CO2: 28 mmol/L (ref 22–32)
Calcium: 9.4 mg/dL (ref 8.9–10.3)
Chloride: 107 mmol/L (ref 98–111)
Creatinine, Ser: 0.68 mg/dL (ref 0.44–1.00)
GFR calc Af Amer: >60 mL/min (ref >60)
GFR calc non Af Amer: >60 mL/min (ref >60)
Glucose, Bld: 91 mg/dL (ref 70–99)
Potassium: 3.9 mmol/L (ref 3.5–5.1)
Sodium: 142 mmol/L (ref 135–145)

## 2018-11-05 MED ORDER — ACETAMINOPHEN 160 MG/5ML PO SOLN: 650.0000 mg | ORAL | Status: DC | PRN

## 2018-11-05 MED ORDER — LISINOPRIL 10 MG PO TABS
10.0000 mg | ORAL_TABLET | Freq: Every day | ORAL | Status: DC
  Administered 2018-11-06 – 2018-11-08 (×3): 10 mg via ORAL
  Filled 2018-11-05 (×3): qty 1

## 2018-11-05 MED ORDER — ACETAMINOPHEN 325 MG PO TABS
650.0000 mg | ORAL_TABLET | ORAL | Status: DC | PRN
  Administered 2018-11-05 – 2018-11-08 (×6): 650 mg via ORAL
  Filled 2018-11-05 (×6): qty 2

## 2018-11-05 MED ORDER — ATORVASTATIN CALCIUM 40 MG PO TABS
40.0000 mg | ORAL_TABLET | Freq: Every day | ORAL | Status: DC
  Administered 2018-11-05 – 2018-11-07 (×3): 40 mg via ORAL
  Filled 2018-11-05 (×3): qty 1

## 2018-11-05 MED ORDER — HYDROXYUREA 500 MG PO CAPS
500.0000 mg | ORAL_CAPSULE | Freq: Every day | ORAL | Status: DC
  Administered 2018-11-05 – 2018-11-07 (×3): 500 mg via ORAL
  Filled 2018-11-05 (×3): qty 1

## 2018-11-05 MED ORDER — APIXABAN 2.5 MG PO TABS
2.5000 mg | ORAL_TABLET | Freq: Two times a day (BID) | ORAL | Status: DC
  Administered 2018-11-05 – 2018-11-08 (×6): 2.5 mg via ORAL
  Filled 2018-11-05 (×6): qty 1

## 2018-11-05 MED ORDER — ASPIRIN EC 81 MG PO TBEC
81.0000 mg | DELAYED_RELEASE_TABLET | Freq: Every day | ORAL | Status: DC
  Administered 2018-11-06 – 2018-11-08 (×3): 81 mg via ORAL
  Filled 2018-11-05 (×3): qty 1

## 2018-11-05 MED ORDER — ASPIRIN 81 MG PO TBEC: 81.0000 mg | DELAYED_RELEASE_TABLET | Freq: Every day | ORAL | 0 refills | Status: AC

## 2018-11-05 MED ORDER — AZATHIOPRINE 50 MG PO TABS
100.0000 mg | ORAL_TABLET | Freq: Every day | ORAL | Status: DC
  Administered 2018-11-06 – 2018-11-08 (×3): 100 mg via ORAL
  Filled 2018-11-05 (×3): qty 2

## 2018-11-05 MED ORDER — PREDNISONE 5 MG PO TABS
5.0000 mg | ORAL_TABLET | Freq: Every day | ORAL | Status: DC
  Administered 2018-11-06 – 2018-11-08 (×3): 5 mg via ORAL
  Filled 2018-11-05 (×3): qty 1

## 2018-11-05 MED ORDER — GABAPENTIN 300 MG PO CAPS
600.0000 mg | ORAL_CAPSULE | Freq: Two times a day (BID) | ORAL | Status: DC
  Administered 2018-11-05 – 2018-11-08 (×6): 600 mg via ORAL
  Filled 2018-11-05 (×6): qty 2

## 2018-11-05 MED ORDER — SORBITOL 70 % SOLN: 30.0000 mL | Freq: Every day | Status: DC | PRN

## 2018-11-05 MED ORDER — HYDROXYUREA 500 MG PO CAPS
1000.0000 mg | ORAL_CAPSULE | Freq: Every day | ORAL | Status: DC
  Administered 2018-11-06 – 2018-11-08 (×3): 1000 mg via ORAL
  Filled 2018-11-05 (×3): qty 2

## 2018-11-05 MED ORDER — SENNOSIDES-DOCUSATE SODIUM 8.6-50 MG PO TABS: 1.0000 | ORAL_TABLET | Freq: Every evening | ORAL | Status: DC | PRN

## 2018-11-05 MED ORDER — CALCIUM CARBONATE 1250 (500 CA) MG PO TABS
1250.0000 mg | ORAL_TABLET | Freq: Every day | ORAL | Status: DC
  Administered 2018-11-06 – 2018-11-08 (×3): 1250 mg via ORAL
  Filled 2018-11-05 (×3): qty 1

## 2018-11-05 MED ORDER — ADULT MULTIVITAMIN W/MINERALS CH
1.0000 | ORAL_TABLET | Freq: Every day | ORAL | Status: DC
  Administered 2018-11-06 – 2018-11-08 (×3): 1 via ORAL
  Filled 2018-11-05 (×3): qty 1

## 2018-11-05 MED ORDER — VITAMIN D 25 MCG (1000 UNIT) PO TABS
1000.0000 [IU] | ORAL_TABLET | Freq: Two times a day (BID) | ORAL | Status: DC
  Administered 2018-11-05 – 2018-11-08 (×6): 1000 [IU] via ORAL
  Filled 2018-11-05 (×6): qty 1

## 2018-11-05 MED ORDER — OXYCODONE-ACETAMINOPHEN 5-325 MG PO TABS: 1.0000 | ORAL_TABLET | Freq: Four times a day (QID) | ORAL | Status: DC | PRN

## 2018-11-05 MED ORDER — ACETAMINOPHEN 650 MG RE SUPP: 650.0000 mg | RECTAL | Status: DC | PRN

## 2018-11-05 MED ORDER — LORAZEPAM 0.5 MG PO TABS: 0.5000 mg | ORAL_TABLET | Freq: Every day | ORAL | Status: DC | PRN

## 2018-11-05 MED ORDER — DULOXETINE HCL 60 MG PO CPEP
60.0000 mg | ORAL_CAPSULE | Freq: Every day | ORAL | Status: DC
  Administered 2018-11-05 – 2018-11-07 (×3): 60 mg via ORAL
  Filled 2018-11-05 (×3): qty 1

## 2018-11-05 MED ORDER — ATORVASTATIN CALCIUM 40 MG PO TABS: 40.0000 mg | ORAL_TABLET | Freq: Every day | ORAL | 0 refills | Status: DC

## 2018-11-05 NOTE — Discharge Instructions (Signed)
Ischemic Stroke  An ischemic stroke is the sudden death of brain tissue. Blood carries oxygen to all areas of the body. This type of stroke happens when your blood does not flow to your brain like normal. Your brain cannot get the oxygen it needs. This is an emergency. It must be treated right away. Symptoms of a stroke usually happen all of a sudden. You may notice them when you wake up. They can include:  Weakness or loss of feeling in your face, arm, or leg. This often happens on one side of the body.  Trouble walking.  Trouble moving your arms or legs.  Loss of balance or coordination.  Feeling confused.  Trouble talking or understanding what people are saying.  Slurred speech.  Trouble seeing.  Seeing two of one object (double vision).  Feeling dizzy.  Feeling sick to your stomach (nauseous) and throwing up (vomiting).  A very bad headache for no reason. Get help as soon as any of these problems start. This is important. Some treatments work better if they are given right away. These include:  Aspirin.  Medicines to control blood pressure.  A shot (injection) of medicine to break up the blood clot.  Treatments given in the blood vessel (artery) to take out the clot or break it up. Other treatments may include:  Oxygen.  Fluids given through an IV tube.  Medicines to thin out your blood.  Procedures to help your blood flow better. What increases the risk? Certain things may make you more likely to have a stroke. Some of these are things that you can change, such as:  Being very overweight (obesity).  Smoking.  Taking birth control pills.  Not being active.  Drinking too much alcohol.  Using drugs. Other risk factors include:  High blood pressure.  High cholesterol.  Diabetes.  Heart disease.  Being [place] American, Native American, Hispanic, or [place] Native.  Being over age 70.  Family history of stroke.  Having had blood clots,  stroke, or warning stroke (transient ischemic attack, TIA) in the past.  Sickle cell disease.  Being a woman with a history of high blood pressure in pregnancy (preeclampsia).  Migraine headache.  Sleep apnea.  Having an irregular heartbeat (atrial fibrillation).  Long-term (chronic) diseases that cause soreness and swelling (inflammation).  Disorders that affect how your blood clots. Follow these instructions at home: Medicines  Take over-the-counter and prescription medicines only as told by your doctor.  If you were told to take aspirin or another medicine to thin your blood, take it exactly as told by your doctor. ? Taking too much of the medicine can cause bleeding. ? If you do not take enough, it may not work as well.  Know the side effects of your medicines. If you are taking a blood thinner, make sure you: ? Hold pressure over any cuts for longer than usual. ? Tell your dentist and other doctors that you take this medicine. ? Avoid activities that may cause damage or injury to your body. Eating and drinking  Follow instructions from your doctor about what you cannot eat or drink.  Eat healthy foods.  If you have trouble with swallowing, do these things to avoid choking: ? Take small bites when eating. ? Eat foods that are soft or pureed. Safety  Follow instructions from your health care team about physical activity.  Use a walker or cane as told by your doctor.  Keep your home safe so you do not fall.  This may include: ? Having experts look at your home to make sure it is safe. ? Putting grab bars in the bedroom and bathroom. ? Using raised toilets. ? Putting a seat in the shower. General instructions  Do not use any tobacco products. ? Examples of these are cigarettes, chewing tobacco, and e-cigarettes. ? If you need help quitting, ask your doctor.  Limit how much alcohol you drink. This means no more than 1 drink a day for nonpregnant women and 2 drinks  a day for men. One drink equals 12 oz of beer, 5 oz of wine, or 1 oz of hard liquor.  If you need help to stop using drugs or alcohol, ask your doctor to refer you to a program or specialist.  Stay active. Exercise as told by your doctor.  Keep all follow-up visits as told by your doctor. This is important. Get help right away if:   You have any signs of a stroke. "BE FAST" is an easy way to remember the main warning signs: ? B - Balance. Signs are dizziness, sudden trouble walking, or loss of balance. ? E - Eyes. Signs are trouble seeing or a change in how you see. ? F - Face. Signs are sudden weakness or loss of feeling of the face, or the face or eyelid drooping on one side. ? A - Arms. Signs are weakness or loss of feeling in an arm. This happens suddenly and usually on one side of the body. ? S - Speech. Signs are sudden trouble speaking, slurred speech, or trouble understanding what people say. ? T - Time. Time to call emergency services. Write down what time symptoms started.  You have other signs of a stroke, such as: ? A sudden, very bad headache with no known cause. ? Feeling sick to your stomach (nausea). ? Throwing up (vomiting). ? Jerky movements you cannot control (seizure). These symptoms may be an emergency. Do not wait to see if the symptoms will go away. Get medical help right away. Call your local emergency services (911 in the [place] Do not drive yourself to the hospital. Summary  An ischemic stroke is the sudden death of brain tissue.  Symptoms of a stroke usually happen all of a sudden. You may notice them when you wake up.  Get help if you have any warning signs of a stroke. This is important. Some treatments work better if they are given right away. This information is not intended to replace advice given to you by your health care provider. Make sure you discuss any questions you have with your health care provider. Document Released: 07/27/2011 Document  Revised: 01/16/2018 Document Reviewed: 11/03/2015 Elsevier Interactive Patient Education  2019 [employer].   Stroke Prevention Some medical conditions and lifestyle choices can lead to a higher risk for a stroke. You can help to prevent a stroke by making nutrition, lifestyle, and other changes. What nutrition changes can be made?   Eat healthy foods. ? Choose foods that are high in fiber. These include:  Fresh fruits.  Fresh vegetables.  Whole grains. ? Eat at least 5 or more servings of fruits and vegetables each day. Try to fill half of your plate at each meal with fruits and vegetables. ? Choose lean protein foods. These include:  Lowfat (lean) cuts of meat.  Chicken without skin.  Fish.  Tofu.  Beans.  Nuts. ? Eat low-fat dairy products. ? Avoid foods that:  Are high in salt (sodium).  Have  saturated fat.  Have trans fat.  Have cholesterol.  Are processed.  Are premade.  Follow eating guidelines as told by your doctor. These may include: ? Reducing how many calories you eat and drink each day. ? Limiting how much salt you eat or drink each day to 1,500 milligrams (mg). ? Using only healthy fats for cooking. These include:  Olive oil.  Canola oil.  Sunflower oil. ? Counting how many carbohydrates you eat and drink each day. What lifestyle changes can be made?  Try to stay at a healthy weight. Talk to your doctor about what a good weight is for you.  Get at least 30 minutes of moderate physical activity at least 5 days a week. This can include: ? Fast walking. ? Biking. ? Swimming.  Do not use any products that have nicotine or tobacco. This includes cigarettes and e-cigarettes. If you need help quitting, ask your doctor. Avoid being around tobacco smoke in general.  Limit how much alcohol you drink to no more than 1 drink a day for nonpregnant women and 2 drinks a day for men. One drink equals 12 oz of beer, 5 oz of wine, or 1 oz of hard  liquor.  Do not use drugs.  Avoid taking birth control pills. Talk to your doctor about the risks of taking birth control pills if: ? You are over 73 years old. ? You smoke. ? You get migraines. ? You have had a blood clot. What other changes can be made?  Manage your cholesterol. ? It is important to eat a healthy diet. ? If your cholesterol cannot be managed through your diet, you may also need to take medicines. Take medicines as told by your doctor.  Manage your diabetes. ? It is important to eat a healthy diet and to exercise regularly. ? If your blood sugar cannot be managed through diet and exercise, you may need to take medicines. Take medicines as told by your doctor.  Control your high blood pressure (hypertension). ? Try to keep your blood pressure below 130/80. This can help lower your risk of stroke. ? It is important to eat a healthy diet and to exercise regularly. ? If your blood pressure cannot be managed through diet and exercise, you may need to take medicines. Take medicines as told by your doctor. ? Ask your doctor if you should check your blood pressure at home. ? Have your blood pressure checked every year. Do this even if your blood pressure is normal.  Talk to your doctor about getting checked for a sleep disorder. Signs of this can include: ? Snoring a lot. ? Feeling very tired.  Take over-the-counter and prescription medicines only as told by your doctor. These may include aspirin or blood thinners (antiplatelets or anticoagulants).  Make sure that any other medical conditions you have are managed. Where to find more information  American Stroke Association: www.strokeassociation.org  National Stroke Association: www.stroke.org Get help right away if:  You have any symptoms of stroke. "BE FAST" is an easy way to remember the main warning signs: ? B - Balance. Signs are dizziness, sudden trouble walking, or loss of balance. ? E - Eyes. Signs are  trouble seeing or a sudden change in how you see. ? F - Face. Signs are sudden weakness or loss of feeling of the face, or the face or eyelid drooping on one side. ? A - Arms. Signs are weakness or loss of feeling in an arm. This happens suddenly  and usually on one side of the body. ? S - Speech. Signs are sudden trouble speaking, slurred speech, or trouble understanding what people say. ? T - Time. Time to call emergency services. Write down what time symptoms started.  You have other signs of stroke, such as: ? A sudden, very bad headache with no known cause. ? Feeling sick to your stomach (nausea). ? Throwing up (vomiting). ? Jerky movements you cannot control (seizure). These symptoms may represent a serious problem that is an emergency. Do not wait to see if the symptoms will go away. Get medical help right away. Call your local emergency services (911 in the [place] Do not drive yourself to the hospital. Summary  You can prevent a stroke by eating healthy, exercising, not smoking, drinking less alcohol, and treating other health problems, such as diabetes, high blood pressure, or high cholesterol.  Do not use any products that contain nicotine or tobacco, such as cigarettes and e-cigarettes.  Get help right away if you have any signs or symptoms of a stroke. This information is not intended to replace advice given to you by your health care provider. Make sure you discuss any questions you have with your health care provider. Document Released: 02/06/2012 Document Revised: 11/08/2016 Document Reviewed: 11/08/2016 Elsevier Interactive Patient Education  2019 [employer].

## 2018-11-05 NOTE — Care Management Note (Signed)
[facility] Individual Statement of Services  Patient Name:  [name]  Date:  11/05/2018  Welcome to the [facility].  Our goal is to provide you with an individualized program based on your diagnosis and situation, designed to meet your specific needs.  With this comprehensive rehabilitation program, you will be expected to participate in at least 3 hours of rehabilitation therapies Monday-Friday, with modified therapy programming on the weekends.  Your rehabilitation program will include the following services:  Physical Therapy (PT), Occupational Therapy (OT), 24 hour per day rehabilitation nursing, Case Management (Social Worker), Rehabilitation Medicine, Nutrition Services and Pharmacy Services  Weekly team conferences will be held on Wednesday to discuss your progress.  Your Social Worker will talk with you frequently to get your input and to update you on team discussions.  Team conferences with you and your family in attendance may also be held.  Expected length of stay: 3-5 days Overall anticipated outcome: mod/i level  Depending on your progress and recovery, your program may change. Your Social Worker will coordinate services and will keep you informed of any changes. Your Social Worker's name and contact numbers are listed  below.  The following services may also be recommended but are not provided by the [facility] will be made to provide these services after discharge if needed.  Arrangements include referral to agencies that provide these services.  Your insurance has been verified to be:  [place] primary doctor is:  Irene Pap  Pertinent information will be shared with your doctor and your insurance company.  Social Worker:  Ovidio Kin, [place] or ([phone]  Information discussed with and copy given to patient by: [name], 11/05/2018, 3:05 PM

## 2018-11-05 NOTE — Progress Notes (Signed)
Inpatient Rehab Admissions:  Inpatient Rehab Consult received.  I met with pt at the bedside for rehabilitation assessment and to discuss goals and expectations of an inpatient rehab admission.  Pt wants to pursue CIR at this time.   I have received medical approval from Dr. Nevada Crane for admit to CIR today. AC has updated the pt, RN, CM/SW regarding plan.   Please call if questions.   Signed: [phone]

## 2018-11-05 NOTE — H&P (Signed)
Physical Medicine and Rehabilitation Admission H&P    Chief Complaint  Patient presents with  . Code Stroke   Chief complaint: Right arm and leg weakness  HPI: [name] 68 year old right-handed female with history of hypertension maintained on HCTZ 12.5 mg daily, hyperlipidemia, GERD,chronic right lower extremity wound 8 weeks followed at the [facility] wound center with weekly changes of UNNA boot, Wegener's granulomatosis maintained on Imuran as well as chronic prednisone 5 mg daily followed by rheumatology at [facility], DVT, obstructive sleep apnea, history of pulmonary emboli on Eliquis.Received inpatient rehabilitation services 2012 debility related to Wegener granulomatosis.. Per chart review patient lives with spouse. Independent prior to admission and driving. Two-level home with bedroom on main level and 5 steps to entry. . Presented 11/03/2018 with right side weakness, facial droop and slurred speech of acute onset. Cranial CT scan suspicious left MCA density in the sylvian fissure. 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 No evidence of associated hemorrhage. MRA negative for large vessel occlusion. Echocardiogram with ejection fraction of 87% normal systolic function. Neurology follow-up patient currently remains on Eliquis as prior to admission as well as the addition of aspirin. Tolerating a regular diet. Bilateral lower extremity Dopplers negative for DVT. Carotid Dopplers with no ICA stenosis. Therapy evaluations completed with recommendations of physical medicine rehabilitation consult. Patient was admitted for a comprehensive rehabilitation program.  Review of Systems  Constitutional: Negative for chills and fever.  HENT: Negative for hearing loss and tinnitus.   Eyes: Negative for blurred vision and double vision.  Respiratory: Negative for  cough and shortness of breath.   Cardiovascular: Positive for leg swelling. Negative for chest pain and palpitations.  Gastrointestinal: Positive for constipation. Negative for heartburn, nausea and vomiting.       GERD  Genitourinary: Negative for flank pain and hematuria.  Musculoskeletal: Positive for joint pain and myalgias.  Skin: Negative for rash.       Chronic right lower extremity wound  Neurological: Positive for speech change and weakness.  Psychiatric/Behavioral: The patient has insomnia.   All other systems reviewed and are negative.  Past Medical History:  Diagnosis Date  . Allergic rhinitis   . Anemia    recurrent iron defic.  Marland Kitchen Cervical polyp 03/2003  . DDD (degenerative disc disease)   . Depression    Dr. Toy Care (situational, divorce, loss of parents)  . Diverticulosis   . DVT (deep venous thrombosis) (HCC)    both legs  . Esophageal stricture   . GERD (gastroesophageal reflux disease)   . Hiatal hernia   . Hyperlipidemia   . Hypertension   . Hypertension   . Neuropathy, peripheral   . OSA (obstructive sleep apnea)   . Osteoarthritis   . Pulmonary embolism ([place])    left lung  . Wegner's disease (congenital syphilitic osteochondritis) 2012   DR. Ginger Organ at [facility]   Past Surgical History:  Procedure Laterality Date  . back injection     steroid injection x2  . BRONCHOSCOPY  april 2012  . iron infusion  11/14   seeing hematologist at [facility]  . lung biospy  may 2012  . PARTIAL HIP ARTHROPLASTY Left 03/2014   Family History  Problem Relation Age of Onset  . Hypertension Mother   . Neuropathy Mother   . Varicose Veins Mother   . Stroke Father   . Lung cancer  Father   . Arthritis Father   . Cancer Father   . Deep vein thrombosis Father   . Hypertension Father   . Varicose Veins Father   . Diabetes Maternal Grandfather   . Diabetes Maternal Uncle   . Diabetes Maternal Aunt   . Hypertension Sister   . Colon cancer Neg Hx    Social History:  reports  that she has never smoked. She has never used smokeless tobacco. She reports that she does not drink alcohol or use drugs. Allergies:  Allergies  Allergen Reactions  . Latex     Sensitive to latex, rash  . Other     Adhesive tape causes rash  . Pregabalin Swelling    Swelling of hands and feet  . Codeine     REACTION: itching   Medications Prior to Admission  Medication Sig Dispense Refill  . acetaminophen (TYLENOL) 325 MG tablet Take 650 mg by mouth as needed for mild pain.     Marland Kitchen apixaban (ELIQUIS) 5 MG TABS tablet Take 2.5 mg by mouth 2 (two) times daily.     Marland Kitchen azaTHIOprine (IMURAN) 50 MG tablet Take 100 mg by mouth daily.    . calcium carbonate (OS-CAL) 600 MG TABS tablet Take 600 mg by mouth daily.     . Cholecalciferol (VITAMIN D PO) Take 1 tablet by mouth 2 (two) times daily.     . DULoxetine (CYMBALTA) 60 MG capsule Take 60 mg by mouth at bedtime.    . gabapentin (NEURONTIN) 600 MG tablet Take 600 mg by mouth 2 (two) times daily.    . hydrochlorothiazide (MICROZIDE) 12.5 MG capsule Take 12.5 mg by mouth daily.     . hydroxyurea (HYDREA) 500 MG capsule Take 500-1,000 mg by mouth See admin instructions. 1000mg  in the morning and 500mg  at night    . ibandronate (BONIVA) 150 MG tablet Take 150 mg by mouth every 30 (thirty) days.     Marland Kitchen lisinopril (PRINIVIL,ZESTRIL) 10 MG tablet Take 10 mg by mouth daily.      Marland Kitchen LORazepam (ATIVAN) 0.5 MG tablet Take 0.5 mg by mouth daily as needed for anxiety.     . Multiple Vitamin (MULTIVITAMIN) tablet Take 1 tablet by mouth daily.      Marland Kitchen oxyCODONE-acetaminophen (PERCOCET/ROXICET) 5-325 MG tablet Take 1 tablet by mouth every 6 (six) hours as needed for pain.    . predniSONE (DELTASONE) 5 MG tablet Take 5 mg by mouth daily.    Marland Kitchen triamcinolone cream (KENALOG) 0.1 % Apply topically 2 (two) times daily.      Drug Regimen Review Drug regimen was reviewed and remains appropriate with no significant issues identified  Home: Home  Living Family/patient expects to be discharged to:: Inpatient rehab Living Arrangements: Spouse/significant other Available Help at Discharge: Family, Available 24 hours/day Type of Home: House Home Access: Stairs to enter [employer] of Steps: 5 Entrance Stairs-Rails: Left Home Layout: Two level, Able to live on main level with bedroom/bathroom Bathroom Shower/Tub: Multimedia programmer: Handicapped height Home Equipment: Environmental consultant - 2 wheels, Cane - single point, Bedside commode, Shower seat  Lives With: Spouse   Functional History: Prior Function Level of Independence: Independent Comments: reports independent ADLs, IADLs, driving   Functional Status:  Mobility: Bed Mobility Overal bed mobility: Needs Assistance Bed Mobility: Supine to Sit Supine to sit: Min assist General bed mobility comments: increased time, assist for trunk Transfers Overall transfer level: Needs assistance Equipment used: Rolling walker (2 wheeled) Transfers: Sit to/from  Stand Sit to Stand: Mod assist General transfer comment: lifting assist from EOB and from low commode in bathroom Ambulation/Gait Ambulation/Gait assistance: Min assist Gait Distance (Feet): 80 Feet Assistive device: Rolling walker (2 wheeled) Gait Pattern/deviations: Step-to pattern, Step-through pattern, Decreased dorsiflexion - right, Decreased stride length, Shuffle, Trunk flexed General Gait Details: skims R foot on floor with her slippers on, able to lift with cues for couple of steps, but degrades back to shuffling foot, assist for safety with walker as R hand weaker, needs assist to keep straight path    ADL: ADL Overall ADL's : Needs assistance/impaired Grooming: Min guard, Minimal assistance, Standing Grooming Details (indicate cue type and reason): increased time and effort to manipulate toothpaste, cueing to maintain upright posture (R lateral lean)  Upper Body Bathing: Minimal assistance,  Sitting Lower Body Bathing: Sit to/from stand, Minimal assistance Lower Body Bathing Details (indicate cue type and reason): requires min assist for safety and balance, decreased functional use of R UE  Upper Body Dressing : Sitting, Minimal assistance Lower Body Dressing: Sit to/from stand, Moderate assistance Lower Body Dressing Details (indicate cue type and reason): min assist in standing, R lateral lean and decreased functional use of R UE  Toilet Transfer: Minimal assistance, Ambulation Toilet Transfer Details (indicate cue type and reason): simulated to recliner, requires cueing for hand placement and safety Functional mobility during ADLs: Minimal assistance, Rolling walker, Min guard, Cueing for safety General ADL Comments: limited by R sided weakness, impaired balance and decreased activity tolerance   Cognition: Cognition Overall Cognitive Status: Impaired/Different from baseline Arousal/Alertness: Awake/alert Orientation Level: Oriented X4 Attention: Sustained Sustained Attention: Appears intact Memory: Appears intact Awareness: Appears intact Problem Solving: Appears intact Safety/Judgment: Appears intact Cognition Arousal/Alertness: Awake/alert Behavior During Therapy: Flat affect Overall Cognitive Status: Impaired/Different from baseline Area of Impairment: Attention, Memory, Safety/judgement, Following commands Current Attention Level: Sustained Memory: Decreased recall of precautions Following Commands: Follows one step commands consistently, Follows one step commands with increased time Safety/Judgement: Decreased awareness of safety Awareness: Emergent Problem Solving: Slow processing, Decreased initiation, Requires verbal cues General Comments: pt requires increased time to process and complete activities; appears close to baseline   Physical Exam: Blood pressure (!) 141/81, pulse 88, temperature 97.9 F (36.6 C), temperature source Oral, resp. rate 17, height  5\' 3"  (1.6 m), weight 80 kg, last menstrual period 12/20/2002, SpO2 96 %. Physical Exam  Constitutional: She appears well-developed and well-nourished.  HENT:  Head: Normocephalic and atraumatic.  Eyes: Pupils are equal, round, and reactive to light. EOM are normal.  Neck: Normal range of motion.  Cardiovascular: Normal rate. Exam reveals no friction rub.  No murmur heard. Respiratory: Effort normal. No respiratory distress. She has no wheezes.  GI: Soft. She exhibits no distension. There is no abdominal tenderness.  Musculoskeletal: Normal range of motion.  Neurological:  Pt alert and appropriate. Mild right facial weakness and tongue deviation. Speech is clear. Language is normal. Reasonable insight and awareness. Senses pain and light touch in all 4's. RUE 4-/5. LUE 4/5. RLE 3+ to 4/5 prox to distal and 4/5 prox to distal.      Skin:  Unna boot in place to right lower extremity for chronic wound  Psychiatric: She has a normal mood and affect. Her behavior is normal.    Results for orders placed or performed during the hospital encounter of 11/03/18 (from the past 48 hour(s))  Protime-INR     Status: None   Collection Time: 11/03/18  4:00 PM  Result  Value Ref Range   Prothrombin Time 13.4 11.4 - 15.2 seconds   INR 1.0 0.8 - 1.2    Comment: (NOTE) INR goal varies based on device and disease states. Performed at [facility], [place] [address], [place], [place] [address]   APTT     Status: None   Collection Time: 11/03/18  4:00 PM  Result Value Ref Range   aPTT 31 24 - 36 seconds    Comment: Performed at [facility] [address], [place], [place] [address]  CBC     Status: Abnormal   Collection Time: 11/03/18  4:00 PM  Result Value Ref Range   WBC 7.0 4.0 - 10.5 K/uL   RBC 2.71 (L) 3.87 - 5.11 MIL/uL   Hemoglobin 9.7 (L) 12.0 - 15.0 g/dL   HCT 31.5 (L) 36.0 - 46.0 %   MCV 116.2 (H) 80.0 - 100.0 fL   MCH 35.8 (H) 26.0 - 34.0 pg   MCHC 30.8 30.0 - 36.0 g/dL    RDW 16.5 (H) 11.5 - 15.5 %   Platelets 522 (H) 150 - 400 K/uL   nRBC 0.0 0.0 - 0.2 %    Comment: Performed at [facility], [place] [address], [place], [place] [address]  Differential     Status: Abnormal   Collection Time: 11/03/18  4:00 PM  Result Value Ref Range   Neutrophils Relative % 85 %   Neutro Abs 6.0 1.7 - 7.7 K/uL   Lymphocytes Relative 7 %   Lymphs Abs 0.5 (L) 0.7 - 4.0 K/uL   Monocytes Relative 7 %   Monocytes Absolute 0.5 0.1 - 1.0 K/uL   Eosinophils Relative 0 %   Eosinophils Absolute 0.0 0.0 - 0.5 K/uL   Basophils Relative 1 %   Basophils Absolute 0.1 0.0 - 0.1 K/uL   Immature Granulocytes 0 %   Abs Immature Granulocytes 0.02 0.00 - 0.07 K/uL    Comment: Performed at [facility] [address] [place], [place] [address]  Comprehensive metabolic panel     Status: Abnormal   Collection Time: 11/03/18  4:00 PM  Result Value Ref Range   Sodium 143 135 - 145 mmol/L   Potassium 4.1 3.5 - 5.1 mmol/L   Chloride 103 98 - 111 mmol/L   CO2 30 22 - 32 mmol/L   Glucose, Bld 95 70 - 99 mg/dL   BUN 24 (H) 8 - 23 mg/dL   Creatinine, Ser 0.84 0.44 - 1.00 mg/dL   Calcium 9.9 8.9 - 10.3 mg/dL   Total Protein 7.2 6.5 - 8.1 g/dL   Albumin 3.6 3.5 - 5.0 g/dL   AST 23 15 - 41 U/L   ALT 12 0 - 44 U/L   Alkaline Phosphatase 76 38 - 126 U/L   Total Bilirubin 0.3 0.3 - 1.2 mg/dL   GFR calc non Af Amer >60 >60 mL/min   GFR calc Af Amer >60 >60 mL/min   Anion gap 10 5 - 15    Comment: Performed at [facility] [address] [place], [place] [address]  HIV antibody (Routine Testing)     Status: None   Collection Time: 11/03/18  4:00 PM  Result Value Ref Range   HIV Screen 4th Generation wRfx Non Reactive Non Reactive    Comment: (NOTE) Performed At: [facility] [address] [place], [place] [id number] Rush Farmer MD SF:[id number]   I-stat Creatinine, ED     Status:  Abnormal   Collection Time: 11/03/18  4:06 PM  Result Value Ref Range    Creatinine, Ser 2.20 (H) 0.44 - 1.00 mg/dL  I-STAT creatinine     Status: Abnormal   Collection Time: 11/03/18  4:06 PM  Result Value Ref Range   Creatinine, Ser 2.20 (H) 0.44 - 1.00 mg/dL    Comment: QUESTIONABLE RESULTS - CHARGE CREDITED REPEATED IN LAB Performed at [facility], [place] [address] [place], [place] [address]   Ethanol     Status: None   Collection Time: 11/03/18  4:30 PM  Result Value Ref Range   Alcohol, Ethyl (B) <10 <10 mg/dL    Comment: (NOTE) Lowest detectable limit for serum alcohol is 10 mg/dL. For medical purposes only. Performed at [facility], [place] [address] [place], [place] [address]   Urine rapid drug screen (hosp performed)     Status: None   Collection Time: 11/03/18  5:51 PM  Result Value Ref Range   Opiates NONE DETECTED NONE DETECTED   Cocaine NONE DETECTED NONE DETECTED   Benzodiazepines NONE DETECTED NONE DETECTED   Amphetamines NONE DETECTED NONE DETECTED   Tetrahydrocannabinol NONE DETECTED NONE DETECTED   Barbiturates NONE DETECTED NONE DETECTED    Comment: (NOTE) DRUG SCREEN FOR MEDICAL PURPOSES ONLY.  IF CONFIRMATION IS NEEDED FOR ANY PURPOSE, NOTIFY LAB WITHIN 5 DAYS. LOWEST DETECTABLE LIMITS FOR URINE DRUG SCREEN Drug Class                     Cutoff (ng/mL) Amphetamine and metabolites    1000 Barbiturate and metabolites    200 Benzodiazepine                 578 Tricyclics and metabolites     300 Opiates and metabolites        300 Cocaine and metabolites        300 THC                            50 Performed at [facility], [place] [address], [place], [place] [address]   Urinalysis, Routine w reflex microscopic     Status: Abnormal   Collection Time: 11/03/18  5:51 PM  Result Value Ref Range   Color, Urine YELLOW YELLOW   APPearance CLOUDY (A) CLEAR   Specific Gravity, Urine 1.014 1.005 - 1.030   pH 9.0 (H) 5.0 - 8.0   Glucose, UA NEGATIVE NEGATIVE mg/dL   Hgb urine dipstick NEGATIVE NEGATIVE    Bilirubin Urine NEGATIVE NEGATIVE   Ketones, ur NEGATIVE NEGATIVE mg/dL   Protein, ur NEGATIVE NEGATIVE mg/dL   Nitrite NEGATIVE NEGATIVE   Leukocytes,Ua NEGATIVE NEGATIVE    Comment: Performed at [facility] [address] [place], [place] [address]  Hemoglobin A1c     Status: None   Collection Time: 11/04/18  6:28 AM  Result Value Ref Range   Hgb A1c MFr Bld 5.3 4.8 - 5.6 %    Comment: (NOTE) Pre diabetes:          5.7%-6.4% Diabetes:              >6.4% Glycemic control for   <7.0% adults with diabetes    Mean Plasma Glucose 105.41 mg/dL    Comment: Performed at [facility] [address], [place],  [address]  Lipid panel     Status: Abnormal   Collection Time:  11/04/18  6:28 AM  Result Value Ref Range   Cholesterol 132 0 - 200 mg/dL   Triglycerides 150 (H) <150 mg/dL   HDL 43 >40 mg/dL   Total CHOL/HDL Ratio 3.1 RATIO   VLDL 30 0 - 40 mg/dL   LDL Cholesterol 59 0 - 99 mg/dL    Comment:        Total Cholesterol/HDL:CHD Risk Coronary Heart Disease Risk Table                     Men   Women  1/2 Average Risk   3.4   3.3  Average Risk       5.0   4.4  2 X Average Risk   9.6   7.1  3 X Average Risk  23.4   11.0        Use the calculated Patient Ratio above and the CHD Risk Table to determine the patient's CHD Risk.        ATP III CLASSIFICATION (LDL):  <100     mg/dL   Optimal  100-129  mg/dL   Near or Above                    Optimal  130-159  mg/dL   Borderline  160-189  mg/dL   High  >190     mg/dL   Very High Performed at [facility] [address], [place], [place] [address]   CBC     Status: Abnormal   Collection Time: 11/04/18  6:28 AM  Result Value Ref Range   WBC 6.4 4.0 - 10.5 K/uL   RBC 2.58 (L) 3.87 - 5.11 MIL/uL   Hemoglobin 9.2 (L) 12.0 - 15.0 g/dL   HCT 29.0 (L) 36.0 - 46.0 %   MCV 112.4 (H) 80.0 - 100.0 fL   MCH 35.7 (H) 26.0 - 34.0 pg   MCHC 31.7 30.0 - 36.0 g/dL   RDW 16.1 (H) 11.5 - 15.5 %   Platelets 515 (H)  150 - 400 K/uL   nRBC 0.0 0.0 - 0.2 %    Comment: Performed at [facility], [place] [address] [place], [place] [address]  Basic metabolic panel     Status: Abnormal   Collection Time: 11/04/18  6:28 AM  Result Value Ref Range   Sodium 142 135 - 145 mmol/L   Potassium 3.4 (L) 3.5 - 5.1 mmol/L   Chloride 107 98 - 111 mmol/L   CO2 29 22 - 32 mmol/L   Glucose, Bld 92 70 - 99 mg/dL   BUN 16 8 - 23 mg/dL   Creatinine, Ser 0.66 0.44 - 1.00 mg/dL    Comment: RESULT REPEATED AND VERIFIED DELTA CHECK NOTED    Calcium 8.8 (L) 8.9 - 10.3 mg/dL   GFR calc non Af Amer >60 >60 mL/min   GFR calc Af Amer >60 >60 mL/min   Anion gap 6 5 - 15    Comment: Performed at [facility] [address], [place], [place] [address]  Basic metabolic panel     Status: None   Collection Time: 11/05/18  4:33 AM  Result Value Ref Range   Sodium 142 135 - 145 mmol/L   Potassium 3.9 3.5 - 5.1 mmol/L   Chloride 107 98 - 111 mmol/L   CO2 28 22 - 32 mmol/L   Glucose, Bld 91 70 - 99 mg/dL   BUN 13 8 - 23 mg/dL  Creatinine, Ser 0.68 0.44 - 1.00 mg/dL   Calcium 9.4 8.9 - 10.3 mg/dL   GFR calc non Af Amer >60 >60 mL/min   GFR calc Af Amer >60 >60 mL/min   Anion gap 7 5 - 15    Comment: Performed at [facility] [address] [place], [place] [address]   Mr Jodene Nam Head Wo Contrast  Result Date: 11/03/2018 CLINICAL DATA:  68 year old female code stroke with right side weakness and questionable hyperdense left MCA on plain head CT today. EXAM: MRI HEAD WITHOUT CONTRAST LIMITED MRA HEAD WITHOUT CONTRAST TECHNIQUE: Limited pulse sequences of the brain and surrounding structures were obtained without intravenous contrast. Angiographic images of the head were obtained using MRA technique without contrast. COMPARISON:  Head CT 1606 hours today . FINDINGS: MRI HEAD FINDINGS DWI and axial FLAIR imaging only was obtained due to acute code stroke presentation. These reveal a small area of restricted diffusion in  the posterior left corona radiata and left posterior lentiform along the margins of a chronic lacunar infarct (series 3, images 37 and 38). There is only mild associated FLAIR hyperintensity limited to the lentiform. No other No restricted diffusion or evidence of acute infarction. No midline shift, mass effect, or evidence of intracranial mass lesion. No evidence of intracranial hemorrhage. Scattered additional bilateral cerebral white matter FLAIR hyperintensity. MRA HEAD FINDINGS Antegrade flow in the posterior circulation with mildly dominant distal left vertebral artery. Highly tortuous vertebrobasilar junction but no distal vertebral stenosis. Both PICA origins remain normal. Tortuous basilar artery without stenosis. Normal SCA origins. Fetal type left PCA origin. Normal right PCA origin and posterior communicating artery. Bilateral PCA branches are within normal limits. Antegrade flow in both ICA siphons. Tortuous cervical right ICA just below the skull base. Mild siphon dolichoectasia without stenosis. Ophthalmic and posterior communicating artery origins appear normal. Patent carotid termini. Normal MCA and ACA origins. Tortuous proximal ACAs. Anterior communicating artery is within normal limits. Visible ACA branches are within normal limits. Right MCA M1, bifurcation, and visible right MCA branches are within normal limits. Left MCA M1 segment is patent to the bifurcation without stenosis. Left MCA bifurcation and visible left MCA branches are within normal limits. IMPRESSION: 1. Negative for large vessel occlusion. 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 2. Positive for acute small vessel infarct along the posterior margins of the chronic lacune of the left corona radiata and lentiform. No evidence of associated hemorrhage or mass effect. These results were communicated to Dr. Cheral Marker at 6:08 pmon 3/15/2020by text page via the [facility] messaging system. Electronically Signed   By: Genevie Ann M.D.    On: 11/03/2018 18:08   Mr Brain Wo Contrast  Result Date: 11/03/2018 CLINICAL DATA:  68 year old female code stroke with right side weakness and questionable hyperdense left MCA on plain head CT today. EXAM: MRI HEAD WITHOUT CONTRAST LIMITED MRA HEAD WITHOUT CONTRAST TECHNIQUE: Limited pulse sequences of the brain and surrounding structures were obtained without intravenous contrast. Angiographic images of the head were obtained using MRA technique without contrast. COMPARISON:  Head CT 1606 hours today . FINDINGS: MRI HEAD FINDINGS DWI and axial FLAIR imaging only was obtained due to acute code stroke presentation. These reveal a small area of restricted diffusion in the posterior left corona radiata and left posterior lentiform along the margins of a chronic lacunar infarct (series 3, images 37 and 38). There is only mild associated FLAIR hyperintensity limited to the lentiform. No other No restricted diffusion or evidence of acute  infarction. No midline shift, mass effect, or evidence of intracranial mass lesion. No evidence of intracranial hemorrhage. Scattered additional bilateral cerebral white matter FLAIR hyperintensity. MRA HEAD FINDINGS Antegrade flow in the posterior circulation with mildly dominant distal left vertebral artery. Highly tortuous vertebrobasilar junction but no distal vertebral stenosis. Both PICA origins remain normal. Tortuous basilar artery without stenosis. Normal SCA origins. Fetal type left PCA origin. Normal right PCA origin and posterior communicating artery. Bilateral PCA branches are within normal limits. Antegrade flow in both ICA siphons. Tortuous cervical right ICA just below the skull base. Mild siphon dolichoectasia without stenosis. Ophthalmic and posterior communicating artery origins appear normal. Patent carotid termini. Normal MCA and ACA origins. Tortuous proximal ACAs. Anterior communicating artery is within normal limits. Visible ACA branches are within normal  limits. Right MCA M1, bifurcation, and visible right MCA branches are within normal limits. Left MCA M1 segment is patent to the bifurcation without stenosis. Left MCA bifurcation and visible left MCA branches are within normal limits. IMPRESSION: 1. Negative for large vessel occlusion. 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 2. Positive for acute small vessel infarct along the posterior margins of the chronic lacune of the left corona radiata and lentiform. No evidence of associated hemorrhage or mass effect. These results were communicated to Dr. Cheral Marker at 6:08 pmon 3/15/2020by text page via the [facility] messaging system. Electronically Signed   By: Genevie Ann M.D.   On: 11/03/2018 18:08   Ct Head Code Stroke Wo Contrast  Result Date: 11/03/2018 CLINICAL DATA:  Code stroke. 68 year old female with right side weakness. EXAM: CT HEAD WITHOUT CONTRAST TECHNIQUE: Contiguous axial images were obtained from the base of the skull through the vertex without intravenous contrast. COMPARISON:  Paranasal sinus CT 08/26/2010. FINDINGS: Brain: Chronic appearing lacunar infarct of the left corona radiata is new since 2012 on series 3, image 20. Mild additional left hemisphere white matter hypodensity. Vascular: Mild Calcified atherosclerosis at the skull base. Mildly hyperdense left MCA in the sylvian fissure on series 6, images 40 and 41 is conspicuously asymmetric. Skull: No cortically based acute infarct identified. No midline shift, ventriculomegaly, mass effect, evidence of mass lesion, intracranial hemorrhage or evidence of cortically based acute infarction. Sinuses/Orbits: Well pneumatized. Mild bubbly opacity in the right sphenoid. Tympanic cavities and mastoids are clear. Other: Visualized orbits and scalp soft tissues are within normal limits. ASPECTS [facility] Stroke Program Early CT Score) - Ganglionic level infarction (caudate, lentiform nuclei, internal capsule, insula, M1-M3 cortex): 7 -  Supraganglionic infarction (M4-M6 cortex): 3 Total score (0-10 with 10 being normal): 10 IMPRESSION: 1. Suspicious left MCA density in the Sylvian fissure. But no CT changes of cortically based infarct. ASPECTS is 10.   No acute intracranial hemorrhage identified. 2. 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 3. These results were communicated to Dr. Cheral Marker at [facility] 3/15/2020by text page via the [facility] messaging system. Electronically Signed   By: Genevie Ann M.D.   On: 11/03/2018 16:17   Vas US Carotid (at [facility] Only)  Result Date: 11/04/2018 Carotid Arterial Duplex Study Indications:  CVA and Weakness. Risk Factors: Hypertension, hyperlipidemia. Performing Technologist: Sharion Dove RVS  Examination Guidelines: A complete evaluation includes B-mode imaging, spectral Doppler, color Doppler, and power Doppler as needed of all accessible portions of each vessel. Bilateral testing is considered an integral part of a complete examination. Limited examinations for reoccurring indications may be performed as noted.  Right Carotid Findings: +----------+--------+--------+--------+------------+--------+  PSV cm/sEDV cm/sStenosisDescribe    Comments +----------+--------+--------+--------+------------+--------+ CCA Prox  105     17              homogeneous          +----------+--------+--------+--------+------------+--------+ CCA Distal81      23              homogeneous          +----------+--------+--------+--------+------------+--------+ ICA Prox  86      27              heterogenous         +----------+--------+--------+--------+------------+--------+ ICA Distal69      23                                   +----------+--------+--------+--------+------------+--------+ ECA       81      17                                   +----------+--------+--------+--------+------------+--------+  +----------+--------+-------+--------+-------------------+           PSV cm/sEDV cmsDescribeArm Pressure (mmHG) +----------+--------+-------+--------+-------------------+ [id number]                                         +----------+--------+-------+--------+-------------------+ +---------+--------+--+--------+--+ VertebralPSV cm/s41EDV cm/s15 +---------+--------+--+--------+--+  Left Carotid Findings: +----------+--------+--------+--------+------------+--------+           PSV cm/sEDV cm/sStenosisDescribe    Comments +----------+--------+--------+--------+------------+--------+ CCA Prox  87      22              homogeneous          +----------+--------+--------+--------+------------+--------+ CCA Distal70      22              homogeneous          +----------+--------+--------+--------+------------+--------+ ICA Prox  82      33              heterogenous         +----------+--------+--------+--------+------------+--------+ ICA Distal118     43                                   +----------+--------+--------+--------+------------+--------+ ECA       93      18                                   +----------+--------+--------+--------+------------+--------+ +----------+--------+--------+--------+-------------------+ SubclavianPSV cm/sEDV cm/sDescribeArm Pressure (mmHG) +----------+--------+--------+--------+-------------------+           184                                         +----------+--------+--------+--------+-------------------+ +---------+--------+--+--------+--+ VertebralPSV cm/s77EDV cm/s19 +---------+--------+--+--------+--+  Summary: Right Carotid: Velocities in the right ICA are consistent with a 1-39% stenosis. Left Carotid: Velocities in the left ICA are consistent with a 1-39% stenosis. Vertebrals:  Bilateral vertebral arteries demonstrate antegrade flow. Subclavians: Normal flow hemodynamics were seen in bilateral  subclavian              arteries. *  See table(s) above for measurements and observations.  Electronically signed by Antony Contras MD on 11/04/2018 at 1:29:47 PM.    Final    Vas [place] Lower Extremity Venous (dvt)  Result Date: 11/04/2018  Lower Venous Study Indications: Stroke.  Limitations: Bandages and body habitus. Comparison Study: No prior study on file Performing Technologist: Sharion Dove RVS  Examination Guidelines: A complete evaluation includes B-mode imaging, spectral Doppler, color Doppler, and power Doppler as needed of all accessible portions of each vessel. Bilateral testing is considered an integral part of a complete examination. Limited examinations for reoccurring indications may be performed as noted.  Right Venous Findings: +---------+---------------+---------+-----------+----------+------------------+          CompressibilityPhasicitySpontaneityPropertiesSummary            +---------+---------------+---------+-----------+----------+------------------+ CFV      Full                                                            +---------+---------------+---------+-----------+----------+------------------+ SFJ      Full                                                            +---------+---------------+---------+-----------+----------+------------------+ FV Prox  Full                                                            +---------+---------------+---------+-----------+----------+------------------+ FV Mid   Full                                                            +---------+---------------+---------+-----------+----------+------------------+ FV DistalFull                                                            +---------+---------------+---------+-----------+----------+------------------+ PFV      Full                                                             +---------+---------------+---------+-----------+----------+------------------+ POP      Full                                                            +---------+---------------+---------+-----------+----------+------------------+ PTV      Full  proximal portion                                                         only               +---------+---------------+---------+-----------+----------+------------------+ PERO                                                  Not visualized     +---------+---------------+---------+-----------+----------+------------------+ GSV      Full                                                            +---------+---------------+---------+-----------+----------+------------------+ Patient with Ardelia Mems boot  Right Technical Findings: Not visualized segments include peroneal vein and segments of the posterior tibial.  Left Venous Findings: +---------+---------------+---------+-----------+----------+-------+          CompressibilityPhasicitySpontaneityPropertiesSummary +---------+---------------+---------+-----------+----------+-------+ CFV      Full           Yes      Yes                          +---------+---------------+---------+-----------+----------+-------+ SFJ      Full                                                 +---------+---------------+---------+-----------+----------+-------+ FV Prox  Full                                                 +---------+---------------+---------+-----------+----------+-------+ FV Mid   Full                                                 +---------+---------------+---------+-----------+----------+-------+ FV DistalFull                                                 +---------+---------------+---------+-----------+----------+-------+ PFV      Full                                                  +---------+---------------+---------+-----------+----------+-------+ POP      Full           Yes      Yes                          +---------+---------------+---------+-----------+----------+-------+  PTV      Full                                                 +---------+---------------+---------+-----------+----------+-------+  Left Technical Findings: Not visualized segments include peroneal vein.   Summary: Right: There is no evidence of deep vein thrombosis in the lower extremity. However, portions of this examination were limited- see technologist comments above. Left: There is no evidence of deep vein thrombosis in the lower extremity. However, portions of this examination were limited- see technologist comments above.  *See table(s) above for measurements and observations. Electronically signed by Servando Snare MD on 11/04/2018 at 2:18:42 PM.    Final        Medical Problem List and Plan: 1.  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  Antithrombotics: -DVT/anticoagulation:  Eliquis. Venous Dopplers negative  -antiplatelet therapy:  Aspirin 81 mg daily 3. Pain Management:  Neurontin 600 mg twice a day,Cymbalta 60 mg daily at bedtime, oxycodone as needed 4. Mood:  Ativan 0.5 mg daily as needed  -antipsychotic agents: see above 5. Neuropsych: This patient is capable of making decisions on her own behalf. 6. Skin/Wound Care/chronic right lower extremity ulcer 8 weeks with weekly changes of UNNA boots followed at the [facility] wound center. Routine skin checks. Consult WOC for [employer] change 7. Fluids/Electrolytes/Nutrition:  Routine in and out's with follow-up chemistries 8. Hypertension. Lisinopril 10 mg daily 9. Wegener's granulomatosis. Continue home regimen of Imuran 100 mg daily, prednisone 5 mg daily 10. Hyperlipidemia. Lipitor      Lavon Paganini Angiulli, PA-C 11/05/2018

## 2018-11-05 NOTE — Plan of Care (Signed)
progressing 

## 2018-11-05 NOTE — Discharge Summary (Signed)
Discharge Summary  [name] JYN:[id number] DOB: [birth year]  PCP: Crist Infante, MD  Admit date: 11/03/2018 Discharge date: 11/05/2018  Time spent: 35 minutes  Recommendations for Outpatient Follow-up:  1. Follow-up in neurology 2. Follow-up with your PCP 3. Follow-up with your provider for Wegener's granulomatosis polyangiitis 4. Take your medication as prescribed 5. Continue physical therapy 6. Fall precautions  Discharge Diagnoses:  Active Hospital Problems   Diagnosis Date Noted  . Acute CVA (cerebrovascular accident) ([place]) 11/03/2018  . Chronic venous insufficiency 01/02/2014  . Ulcer of lower limb ([place]) 01/02/2014  . 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 Hyperlipemia 12/07/2008  . Essential hypertension 01/01/2008  . GERD 12/03/2007    Resolved Hospital Problems  No resolved problems to display.    Discharge Condition: Stable  Diet recommendation: Resume previous diet  Vitals:   11/05/18 0517 11/05/18 0853  BP:  (!) 141/81  Pulse: 72 88  Resp:  17  Temp: 97.6 F (36.4 C) 97.9 F (36.6 C)  SpO2: 96% 96%    History of present illness:   Joyce Marcano Normanis a 68 y.o.femalewith medical history significant ofhypertension, hyperlipidemia, GERD, Wegener's granulomatosis, DVT, obstructive sleep apnea, history of PE on Eliquis who was doing well until this morning when she woke up fine. Around 3:00 patient noticed sudden weakness in her right arm and leg. She could not get up from a table. EMS was called and patient was brought to the ER. A code stroke was called for but patient was not a candidate for TPA due to being on Eliquis. On the way to the hospital EMS noted a right facial droop. She also has right-sided weakness. Patient has not had any previous stroke but have family history of strokes in her dad. She has been evaluated by neurology and at this point being admitted after MRI showed an acute stroke. Patient still  has residual weakness on the right. She is able to speak without difficulty.   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 No hemorrhage. MRA shows no large vessel occlusion. Patient has been seen by neurology and is being admitted for stroke work-up.  Bilateral carotid Doppler ultrasound unremarkable for any significant stenosis.  Bilateral lower extremity Doppler ultrasound negative, 2D echo unrevealing.  11/05/18: Patient seen and examined with her husband at bedside.  No acute events overnight.  She has no new complaints.  States she feels better.  She is being evaluated for possible physical rehab at [facility].  On the day of discharge, the patient was hemodynamically stable.  She will need to follow-up with neurology and her provider for wegner's granulomatosis polyangiitis.   Hospital Course:  Principal Problem:   Acute CVA (cerebrovascular accident) [facility]) Active Problems:   Hyperlipemia   Essential hypertension   GERD   Wegener's granulomatosis with vasculitis ([place])   Ulcer of lower limb (H[facility])   Chronic venous insufficiency  Acute small vessel infarct along the posterior margins of the chronic lacune of the left corona radiata and Lentiform vs Wegener's vasculitis. Presented with sudden onset of right-sided weakness MRA and MRI of head with suspicion for above Bilateral carotid Doppler ultrasound unremarkable for any significant stenosis LDL and A1c at goal 2D echo unrevealing Neurology consulted and followed with recommendations Continue Lipitor 40 mg daily Already on Eliquis for history of DVT and PE; add aspirin per neurology recommendations Continue PT OT Fall precautions  Neurology recommendations:  Stroke:  left corona raidiata/lentiform nucleus infarct secondary to small vessel disease vs Wegener's vasculitis   Code Stroke CT head suspicious L MCA density. Old L CR infarct new  since 2012. ASPECTS 10.     MRI  Acute  infarct along L CR and LN chronic lacune  MRA  No LVO.  Carotid Doppler  B ICA 1-39% stenosis, VAs antegrade   2D Echo  EF 55-60%. No source of embolus   LE dopplers negative   LDL 59  HgbA1c 5.3  eliquis for VTE prophylaxis  Eliquis (apixaban) daily prior to admission, now on aspirin 325 mg daily and Eliquis (apixaban) daily. Will decrease aspirin to 81 mg daily. Continue aspirin along with Eliquis at d/c given cardiac disease.  Therapy recommendations:  [place] OT  Follow-up Stroke Clinic at [facility] in 4 weeks. Office will call with appointment date and time. Order placed.  Resolved hypokalemia post repletion   Essential hypertension: Stable Permissive hypertension (OK if < 220/120) but gradually normalize in 5-7 days Long-term BP goal normotensive Continue lisinopril  Chronic right lower extremity ulcer:Patient goes to the wound care at [facility].  Bilateral lower extremity Doppler ultrasound negative for DVT  GERD: Continue with PPIs.  History of Wegener's granulomatosis:Follow-up with provider at [facility].  Chronic depression/anxiety Continue Cymbalta  History of DVT/PE Continue Eliquis  Ambulatory dysfunction post stroke OT recommends home health OT with 24-hour supervision assistance Also CIR consideration Fall precautions     Code Status:Full code  Consults called:Neurology Dr. Cheral Marker     Discharge Exam: BP (!) 141/81 (BP Location: Left Arm)   Pulse 88   Temp 97.9 F (36.6 C) (Oral)   Resp 17   Ht 5\' 3"  (1.6 m)   Wt 80 kg   LMP 12/20/2002   SpO2 96%   BMI 31.24 kg/m  . General: 68 y.o. year-old female well developed well nourished in no acute distress.  Alert and oriented x3. . Cardiovascular: Regular rate and rhythm with no rubs or gallops.  No thyromegaly or JVD noted.   Marland Kitchen Respiratory: Clear to auscultation with no wheezes or rales. Good inspiratory  effort. . Abdomen: Soft nontender nondistended with normal bowel sounds x4 quadrants. . Musculoskeletal: Mild right upper extremity drift.  Significant weakness of right grip and intrinsic hand muscles.  Symmetrical lower extremity strength. . Psychiatry: Mood is appropriate for condition and setting  Discharge Instructions You were cared for by a hospitalist during your hospital stay. If you have any questions about your discharge medications or the care you received while you were in the hospital after you are discharged, you can call the unit and asked to speak with the hospitalist on call if the hospitalist that took care of you is not available. Once you are discharged, your primary care physician will handle any further medical issues. Please note that NO REFILLS for any discharge medications will be authorized once you are discharged, as it is imperative that you return to your primary care physician (or establish a relationship with a primary care physician if you do not have one) for your aftercare needs so that they can reassess your need for medications and monitor your lab values.  Discharge Instructions    Ambulatory referral to Neurology   Complete by:  As directed    Follow up with stroke clinic NP (Jessica Vanschaick or Cecille Rubin, if both not available, consider Dr. Antony Contras, Dr. Bess Harvest, or Dr. Sarina Ill) at [facility] in about  4 weeks.     Allergies as of 11/05/2018      Reactions   Latex    Sensitive to latex, rash   Other    Adhesive tape causes rash   Pregabalin Swelling   Swelling of hands and feet   Codeine    REACTION: itching      Medication List    STOP taking these medications   hydrochlorothiazide 12.5 MG capsule Commonly known as:  MICROZIDE     TAKE these medications   acetaminophen 325 MG tablet Commonly known as:  TYLENOL Take 650 mg by mouth as needed for mild pain.   apixaban 5 MG Tabs tablet Commonly  known as:  ELIQUIS Take 2.5 mg by mouth 2 (two) times daily.   aspirin 81 MG EC tablet Take 1 tablet (81 mg total) by mouth daily. Start taking on:  November 06, 2018   atorvastatin 40 MG tablet Commonly known as:  LIPITOR Take 1 tablet (40 mg total) by mouth daily at 6 PM.   azaTHIOprine 50 MG tablet Commonly known as:  IMURAN Take 100 mg by mouth daily.   calcium carbonate 600 MG Tabs tablet Commonly known as:  OS-CAL Take 600 mg by mouth daily.   DULoxetine 60 MG capsule Commonly known as:  CYMBALTA Take 60 mg by mouth at bedtime.   gabapentin 600 MG tablet Commonly known as:  NEURONTIN Take 600 mg by mouth 2 (two) times daily.   hydroxyurea 500 MG capsule Commonly known as:  HYDREA Take 500-1,000 mg by mouth See admin instructions. 1000mg  in the morning and 500mg  at night   ibandronate 150 MG tablet Commonly known as:  BONIVA Take 150 mg by mouth every 30 (thirty) days.   lisinopril 10 MG tablet Commonly known as:  PRINIVIL,ZESTRIL Take 10 mg by mouth daily.   LORazepam 0.5 MG tablet Commonly known as:  ATIVAN Take 0.5 mg by mouth daily as needed for anxiety.   multivitamin tablet Take 1 tablet by mouth daily.   oxyCODONE-acetaminophen 5-325 MG tablet Commonly known as:  PERCOCET/ROXICET Take 1 tablet by mouth every 6 (six) hours as needed for pain.   predniSONE 5 MG tablet Commonly known as:  DELTASONE Take 5 mg by mouth daily.   triamcinolone cream 0.1 % Commonly known as:  KENALOG Apply topically 2 (two) times daily.   VITAMIN D PO Take 1 tablet by mouth 2 (two) times daily.      Allergies  Allergen Reactions  . Latex     Sensitive to latex, rash  . Other     Adhesive tape causes rash  . Pregabalin Swelling    Swelling of hands and feet  . Codeine     REACTION: itching   Follow-up Information    [facility] Follow up in 4 week(s).   Specialty:  Neurology Why:  stroke clinic. office will call with appt date and time.   Contact information: [address] [place] [phone]       Crist Infante, MD. Call in 1 day(s).   Specialty:  Internal Medicine Why:  Please call for post hospital follow-up appointment. Contact information: [address] [place] [address] [phone]            The results of significant diagnostics from this hospitalization (including imaging, microbiology, ancillary and laboratory) are listed below for reference.    Significant Diagnostic Studies: Mr Virgel Paling NG Contrast  Result Date: 11/03/2018 CLINICAL DATA:  68 year old female code stroke with right side weakness and questionable hyperdense left MCA on plain head CT today. EXAM: MRI HEAD WITHOUT CONTRAST LIMITED MRA HEAD WITHOUT CONTRAST TECHNIQUE: Limited pulse sequences of the brain and surrounding structures were obtained without intravenous contrast. Angiographic images of the head were obtained using MRA technique without contrast. COMPARISON:  Head CT 1606 hours today . FINDINGS: MRI HEAD FINDINGS DWI and axial FLAIR imaging only was obtained due to acute code stroke presentation. These reveal a small area of restricted diffusion in the posterior left corona radiata and left posterior lentiform along the margins of a chronic lacunar infarct (series 3, images 37 and 38). There is only mild associated FLAIR hyperintensity limited to the lentiform. No other No restricted diffusion or evidence of acute infarction. No midline shift, mass effect, or evidence of intracranial mass lesion. No evidence of intracranial hemorrhage. Scattered additional bilateral cerebral white matter FLAIR hyperintensity. MRA HEAD FINDINGS Antegrade flow in the posterior circulation with mildly dominant distal left vertebral artery. Highly tortuous vertebrobasilar junction but no distal vertebral stenosis. Both PICA origins remain normal. Tortuous basilar artery without stenosis. Normal SCA origins. Fetal  type left PCA origin. Normal right PCA origin and posterior communicating artery. Bilateral PCA branches are within normal limits. Antegrade flow in both ICA siphons. Tortuous cervical right ICA just below the skull base. Mild siphon dolichoectasia without stenosis. Ophthalmic and posterior communicating artery origins appear normal. Patent carotid termini. Normal MCA and ACA origins. Tortuous proximal ACAs. Anterior communicating artery is within normal limits. Visible ACA branches are within normal limits. Right MCA M1, bifurcation, and visible right MCA branches are within normal limits. Left MCA M1 segment is patent to the bifurcation without stenosis. Left MCA bifurcation and visible left MCA branches are within normal limits. IMPRESSION: 1. Negative for large vessel occlusion. 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 2. Positive for acute small vessel infarct along the posterior margins of the chronic lacune of the left corona radiata and lentiform. No evidence of associated hemorrhage or mass effect. These results were communicated to Dr. Cheral Marker at 6:08 pmon 3/15/2020by text page via the [facility] messaging system. Electronically Signed   By: Genevie Ann M.D.   On: 11/03/2018 18:08   Mr Brain Wo Contrast  Result Date: 11/03/2018 CLINICAL DATA:  68 year old female code stroke with right side weakness and questionable hyperdense left MCA on plain head CT today. EXAM: MRI HEAD WITHOUT CONTRAST LIMITED MRA HEAD WITHOUT CONTRAST TECHNIQUE: Limited pulse sequences of the brain and surrounding structures were obtained without intravenous contrast. Angiographic images of the head were obtained using MRA technique without contrast. COMPARISON:  Head CT 1606 hours today . FINDINGS: MRI HEAD FINDINGS DWI and axial FLAIR imaging only was obtained due to acute code stroke presentation. These reveal a small area of restricted diffusion in the posterior left corona radiata and left posterior lentiform along the  margins of a chronic lacunar infarct (series 3, images 37 and 38). There is only mild associated FLAIR hyperintensity limited to the lentiform. No other No restricted diffusion or evidence of acute infarction. No midline shift, mass effect, or evidence of intracranial mass lesion. No evidence of intracranial hemorrhage. Scattered additional bilateral cerebral white matter FLAIR hyperintensity. MRA HEAD FINDINGS Antegrade flow in the posterior circulation with mildly dominant distal left vertebral artery. Highly tortuous vertebrobasilar junction but no distal vertebral stenosis. Both PICA origins remain normal. Tortuous basilar artery without stenosis. Normal SCA origins. Fetal type left PCA origin. Normal right PCA origin and  posterior communicating artery. Bilateral PCA branches are within normal limits. Antegrade flow in both ICA siphons. Tortuous cervical right ICA just below the skull base. Mild siphon dolichoectasia without stenosis. Ophthalmic and posterior communicating artery origins appear normal. Patent carotid termini. Normal MCA and ACA origins. Tortuous proximal ACAs. Anterior communicating artery is within normal limits. Visible ACA branches are within normal limits. Right MCA M1, bifurcation, and visible right MCA branches are within normal limits. Left MCA M1 segment is patent to the bifurcation without stenosis. Left MCA bifurcation and visible left MCA branches are within normal limits. IMPRESSION: 1. Negative for large vessel occlusion. 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 2. Positive for acute small vessel infarct along the posterior margins of the chronic lacune of the left corona radiata and lentiform. No evidence of associated hemorrhage or mass effect. These results were communicated to Dr. Cheral Marker at 6:08 pmon 3/15/2020by text page via the [facility] messaging system. Electronically Signed   By: Genevie Ann M.D.   On: 11/03/2018 18:08   Ct Head Code Stroke Wo Contrast  Result  Date: 11/03/2018 CLINICAL DATA:  Code stroke. 68 year old female with right side weakness. EXAM: CT HEAD WITHOUT CONTRAST TECHNIQUE: Contiguous axial images were obtained from the base of the skull through the vertex without intravenous contrast. COMPARISON:  Paranasal sinus CT 08/26/2010. FINDINGS: Brain: Chronic appearing lacunar infarct of the left corona radiata is new since 2012 on series 3, image 20. Mild additional left hemisphere white matter hypodensity. Vascular: Mild Calcified atherosclerosis at the skull base. Mildly hyperdense left MCA in the sylvian fissure on series 6, images 40 and 41 is conspicuously asymmetric. Skull: No cortically based acute infarct identified. No midline shift, ventriculomegaly, mass effect, evidence of mass lesion, intracranial hemorrhage or evidence of cortically based acute infarction. Sinuses/Orbits: Well pneumatized. Mild bubbly opacity in the right sphenoid. Tympanic cavities and mastoids are clear. Other: Visualized orbits and scalp soft tissues are within normal limits. ASPECTS [facility] Stroke Program Early CT Score) - Ganglionic level infarction (caudate, lentiform nuclei, internal capsule, insula, M1-M3 cortex): 7 - Supraganglionic infarction (M4-M6 cortex): 3 Total score (0-10 with 10 being normal): 10 IMPRESSION: 1. Suspicious left MCA density in the Sylvian fissure. But no CT changes of cortically based infarct. ASPECTS is 10.   No acute intracranial hemorrhage identified. 2. 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 3. These results were communicated to Dr. Cheral Marker at [place] 3/15/2020by text page via the [facility] messaging system. Electronically Signed   By: Genevie Ann M.D.   On: 11/03/2018 16:17   Vas US Carotid (at [place] Only)  Result Date: 11/04/2018 Carotid Arterial Duplex Study Indications:  CVA and Weakness. Risk Factors: Hypertension, hyperlipidemia. Performing Technologist: Sharion Dove RVS  Examination Guidelines:  A complete evaluation includes B-mode imaging, spectral Doppler, color Doppler, and power Doppler as needed of all accessible portions of each vessel. Bilateral testing is considered an integral part of a complete examination. Limited examinations for reoccurring indications may be performed as noted.  Right Carotid Findings: +----------+--------+--------+--------+------------+--------+           PSV cm/sEDV cm/sStenosisDescribe    Comments +----------+--------+--------+--------+------------+--------+ CCA Prox  105     17              homogeneous          +----------+--------+--------+--------+------------+--------+ CCA Distal81      23              homogeneous          +----------+--------+--------+--------+------------+--------+  ICA Prox  86      27              heterogenous         +----------+--------+--------+--------+------------+--------+ ICA Distal69      23                                   +----------+--------+--------+--------+------------+--------+ ECA       81      17                                   +----------+--------+--------+--------+------------+--------+ +----------+--------+-------+--------+-------------------+           PSV cm/sEDV cmsDescribeArm Pressure (mmHG) +----------+--------+-------+--------+-------------------+ Subclavian69                                         +----------+--------+-------+--------+-------------------+ +---------+--------+--+--------+--+ VertebralPSV cm/s41EDV cm/s15 +---------+--------+--+--------+--+  Left Carotid Findings: +----------+--------+--------+--------+------------+--------+           PSV cm/sEDV cm/sStenosisDescribe    Comments +----------+--------+--------+--------+------------+--------+ CCA Prox  87      22              homogeneous          +----------+--------+--------+--------+------------+--------+ CCA Distal70      22              homogeneous           +----------+--------+--------+--------+------------+--------+ ICA Prox  82      33              heterogenous         +----------+--------+--------+--------+------------+--------+ ICA Distal118     43                                   +----------+--------+--------+--------+------------+--------+ ECA       93      18                                   +----------+--------+--------+--------+------------+--------+ +----------+--------+--------+--------+-------------------+ SubclavianPSV cm/sEDV cm/sDescribeArm Pressure (mmHG) +----------+--------+--------+--------+-------------------+           184                                         +----------+--------+--------+--------+-------------------+ +---------+--------+--+--------+--+ VertebralPSV cm/s77EDV cm/s19 +---------+--------+--+--------+--+  Summary: Right Carotid: Velocities in the right ICA are consistent with a 1-39% stenosis. Left Carotid: Velocities in the left ICA are consistent with a 1-39% stenosis. Vertebrals:  Bilateral vertebral arteries demonstrate antegrade flow. Subclavians: Normal flow hemodynamics were seen in bilateral subclavian              arteries. *See table(s) above for measurements and observations.  Electronically signed by Antony Contras MD on 11/04/2018 at 1:29:47 PM.    Final    Vas [place] Lower Extremity Venous (dvt)  Result Date: 11/04/2018  Lower Venous Study Indications: Stroke.  Limitations: Bandages and body habitus. Comparison Study: No prior study on file Performing Technologist: Sharion Dove RVS  Examination Guidelines: A complete evaluation includes B-mode imaging, spectral Doppler, color Doppler, and  power Doppler as needed of all accessible portions of each vessel. Bilateral testing is considered an integral part of a complete examination. Limited examinations for reoccurring indications may be performed as noted.  Right Venous Findings:  +---------+---------------+---------+-----------+----------+------------------+          CompressibilityPhasicitySpontaneityPropertiesSummary            +---------+---------------+---------+-----------+----------+------------------+ CFV      Full                                                            +---------+---------------+---------+-----------+----------+------------------+ SFJ      Full                                                            +---------+---------------+---------+-----------+----------+------------------+ FV Prox  Full                                                            +---------+---------------+---------+-----------+----------+------------------+ FV Mid   Full                                                            +---------+---------------+---------+-----------+----------+------------------+ FV DistalFull                                                            +---------+---------------+---------+-----------+----------+------------------+ PFV      Full                                                            +---------+---------------+---------+-----------+----------+------------------+ POP      Full                                                            +---------+---------------+---------+-----------+----------+------------------+ PTV      Full                                         proximal portion  only               +---------+---------------+---------+-----------+----------+------------------+ PERO                                                  Not visualized     +---------+---------------+---------+-----------+----------+------------------+ GSV      Full                                                            +---------+---------------+---------+-----------+----------+------------------+ Patient with Ardelia Mems boot  Right  Technical Findings: Not visualized segments include peroneal vein and segments of the posterior tibial.  Left Venous Findings: +---------+---------------+---------+-----------+----------+-------+          CompressibilityPhasicitySpontaneityPropertiesSummary +---------+---------------+---------+-----------+----------+-------+ CFV      Full           Yes      Yes                          +---------+---------------+---------+-----------+----------+-------+ SFJ      Full                                                 +---------+---------------+---------+-----------+----------+-------+ FV Prox  Full                                                 +---------+---------------+---------+-----------+----------+-------+ FV Mid   Full                                                 +---------+---------------+---------+-----------+----------+-------+ FV DistalFull                                                 +---------+---------------+---------+-----------+----------+-------+ PFV      Full                                                 +---------+---------------+---------+-----------+----------+-------+ POP      Full           Yes      Yes                          +---------+---------------+---------+-----------+----------+-------+ PTV      Full                                                 +---------+---------------+---------+-----------+----------+-------+  Left Technical Findings: Not visualized segments include peroneal vein.   Summary: Right: There is no evidence of deep vein thrombosis in the lower extremity. However, portions of this examination were limited- see technologist comments above. Left: There is no evidence of deep vein thrombosis in the lower extremity. However, portions of this examination were limited- see technologist comments above.  *See table(s) above for measurements and observations. Electronically signed by Servando Snare MD on  11/04/2018 at 2:18:42 PM.    Final     Microbiology: No results found for this or any previous visit (from the past 240 hour(s)).   Labs: Basic Metabolic Panel: Recent Labs  Lab 11/03/18 1600 11/03/18 1606 11/04/18 0628 11/05/18 0433  NA 143  --  142 142  K 4.1  --  3.4* 3.9  CL 103  --  107 107  CO2 30  --  29 28  GLUCOSE 95  --  92 91  BUN 24*  --  16 13  CREATININE 0.84 2.20*  2.20* 0.66 0.68  CALCIUM 9.9  --  8.8* 9.4   Liver Function Tests: Recent Labs  Lab 11/03/18 1600  AST 23  ALT 12  ALKPHOS 76  BILITOT 0.3  PROT 7.2  ALBUMIN 3.6   No results for input(s): LIPASE, AMYLASE in the last 168 hours. No results for input(s): AMMONIA in the last 168 hours. CBC: Recent Labs  Lab 11/03/18 1600 11/04/18 0628  WBC 7.0 6.4  NEUTROABS 6.0  --   HGB 9.7* 9.2*  HCT 31.5* 29.0*  MCV 116.2* 112.4*  PLT 522* 515*   Cardiac Enzymes: No results for input(s): CKTOTAL, CKMB, CKMBINDEX, TROPONINI in the last 168 hours. BNP: BNP (last 3 results) No results for input(s): BNP in the last 8760 hours.  ProBNP (last 3 results) No results for input(s): PROBNP in the last 8760 hours.  CBG: No results for input(s): GLUCAP in the last 168 hours.     Signed:  Kayleen Memos, MD [facility] 11/05/2018, 10:15 AM

## 2018-11-05 NOTE — Progress Notes (Signed)
Pt arrived to unit via bed. Side rails x 3. Oriented to unit and safety plan/precautions. Call bell placed in reach. Will cont to monitor.   Erie Noe, LPN

## 2018-11-05 NOTE — Progress Notes (Signed)
  Speech Language Pathology Treatment: Cognitive-Linquistic  Patient Details Name: [name] MRN: [id number] DOB: 1950-12-05 Today's Date: 11/05/2018 Time: 2229-7989 SLP Time Calculation (min) (ACUTE ONLY): 23 min  Assessment / Plan / Recommendation Clinical Impression  Pt seen for diagnostic tx session, addressing cognition in functional tasks, to further determine need for SLP services. Upon SLP arrival, pt looking at food service menu to determine breakfast choice. She independently called [facility] nutritional services and ordered breakfast for both her and her spouse, with no sign or report of difficulty. Pt reported that she is the one that manages the money and reminds her spouse of bills coming due each month. She was able to state what bills were presently coming due with spouse there to confirm. She completed a structured money counting and phone message documenting task with no difficulty or error. She demonstrated adequate sustained and alternating attention during all functional tasks. Spouse and pt report no concerns with cognition. Given adequacy during functional tasks and lack of concern from pt and family, SLP services are no longer warranted.    HPI HPI: Pt is a 68 y.o. female with medical history significant of hypertension, hyperlipidemia, GERD, Wegener's granulomatosis, DVT, obstructive sleep apnea, history of PE on Eliquis, presenting to ED with R arm and leg weakness, R facial droop. 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 No evidence of associated hemorrhage or mass effect.      SLP Plan  Discharge SLP treatment due to (comment)       Recommendations                   Oral Care Recommendations: Oral care BID Follow up Recommendations: None SLP Visit Diagnosis: Cognitive communication deficit (Q11.941) Plan: Discharge SLP treatment due to  (comment)       GO                Ellis Savage, Student SLP 11/05/2018, 9:49 AM

## 2018-11-05 NOTE — Progress Notes (Signed)
Meredith Staggers, MD  Physician  Physical Medicine and Rehabilitation  PMR Pre-admission  Signed  Date of Service:  11/05/2018 11:46 AM       Related encounter: ED to Hosp-Admission (Discharged) from 11/03/2018 in [facility]      Signed          PMR Admission Coordinator Pre-Admission Assessment  Patient: [name] is an 67 y.o., female MRN: [id number] DOB: [birth year] Height: 5' 3"  (160 cm) Weight: 80 kg  Insurance Information HMO:     PPO:      PCP:      IPA:      80/20: Yes     OTHER:  PRIMARY: Medicare Part A and B       Policy#: [id number]      Subscriber: Patient CM Name:       Phone#:      Fax#:  Pre-Cert#:       Employer:  Benefits:  Phone #: NA     Name: Verified eligibility via [place] on 11/05/18 Eff. Date: Part A 04/21/13; Part B 04/21/13     Deduct: $1,408      Out of Pocket Max: NA      Life Max: NA CIR: Covered per Medicare guidelines once yearly deductible is met      SNF: days 1-20, 100%, days 21-100, 80% Outpatient: 80%     Co-Pay: 20% Home Health: 100%      Co-Pay:  DME: 80%     Co-Pay: 20% Providers: Pt's choice  SECONDARY: [employer]      Policy#: [id number]      Subscriber: patient CM Name:       Phone#:      Fax#:  Pre-Cert#:       Employer:  Benefits:  Phone #: [phone]     Name:  Eff. 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 Received inpatient rehabilitation services 2012 debility related to Wegener granulomatosis.Pt presented 11/03/2018 with right side weakness, facial droop and slurred speech of acute onset. Cranial CT scan suspicious left MCA density in the sylvian fissure. Patient did not receive TPA. MRI showed acute small vessel infarction along the posterior margins of the chronic lacune of the left corona radiata and lentiform; No evidence of associated hemorrhage. MRA negative for large vessel occlusion. Echocardiogram with  ejection fraction of 58% normal systolic function. Bilateral lower extremity Dopplers negative for DVT. Carotid Dopplers with no ICA stenosis. Therapy evaluations completed with recommendations for CIR. Patient is to be admitted for a comprehensive rehabilitation program on 11/05/18.  Patient's medical record from [facility] has been reviewed by the rehabilitation admission coordinator and physician. NIH Stroke scale: 1   Past Medical History      Past Medical History:  Diagnosis Date  . Allergic rhinitis   . Anemia    recurrent iron defic.  Marland Kitchen Cervical polyp 03/2003  . DDD (degenerative disc disease)   . Depression    Dr. Toy Care (situational, divorce, loss of parents)  . Diverticulosis   . DVT (deep venous thrombosis) (HCC)    both legs  . Esophageal stricture   . GERD (gastroesophageal reflux disease)   . Hiatal hernia   . Hyperlipidemia   . Hypertension   . Hypertension   . Neuropathy, peripheral   . OSA (obstructive sleep apnea)   . Osteoarthritis   . Pulmonary embolism ([place])     left lung  . Wegner's disease (congenital syphilitic osteochondritis) 2012   DR. Ginger Organ at [place] History   family history includes Arthritis in her father; Cancer in her father; Deep vein thrombosis in her father; Diabetes in her maternal aunt, maternal grandfather, and maternal uncle; Hypertension in her father, mother, and sister; Lung cancer in her father; Neuropathy in her mother; Stroke in her father; Varicose Veins in her father and mother.  Prior Rehab/Hospitalizations Has the patient had major surgery during 100 days prior to admission? No               Current Medications  Current Facility-Administered Medications:  .  acetaminophen (TYLENOL) tablet 650 mg, 650 mg, Oral, Q4H PRN, 650 mg at 11/05/18 0517 **OR** acetaminophen (TYLENOL) solution 650 mg, 650 mg, Per Tube, Q4H PRN **OR** acetaminophen (TYLENOL) suppository 650 mg, 650 mg, Rectal, Q4H PRN, Jonelle Sidle, Mohammad L, MD .  apixaban (ELIQUIS) tablet 2.5 mg, 2.5 mg, Oral, BID, Jonelle Sidle, Mohammad L, MD, 2.5 mg at 11/05/18 0905 .  aspirin EC tablet 81 mg, 81 mg, Oral, Daily, Biby, Sharon L, NP, 81 mg at 11/05/18 0906 .  atorvastatin (LIPITOR) tablet 40 mg, 40 mg, Oral, q1800, Gala Romney L, MD, 40 mg at 11/04/18 1857 .  azaTHIOprine (IMURAN) tablet 100 mg, 100 mg, Oral, Daily, Jonelle Sidle, Mohammad L, MD, 100 mg at 11/05/18 0905 .  calcium carbonate (OS-CAL - dosed in mg of elemental calcium) tablet 1,250 mg, 1,250 mg, Oral, Q breakfast, Jonelle Sidle, Mohammad L, MD, 1,250 mg at 11/05/18 0905 .  cholecalciferol (VITAMIN D3) tablet 1,000 Units, 1,000 Units, Oral, BID, Elwyn Reach, MD, 1,000 Units at 11/05/18 0905 .  DULoxetine (CYMBALTA) DR capsule 60 mg, 60 mg, Oral, QHS, Garba, Mohammad L, MD, 60 mg at 11/04/18 2045 .  gabapentin (NEURONTIN) capsule 600 mg, 600 mg, Oral, BID, Jonelle Sidle, Mohammad L, MD, 600 mg at 11/05/18 0905 .  hydroxyurea (HYDREA) capsule 1,000 mg, 1,000 mg, Oral, Daily, 1,000 mg at 11/05/18 0911 **AND**  hydroxyurea (HYDREA) capsule 500 mg, 500 mg, Oral, QHS, Garba, Mohammad L, MD, 500 mg at 11/04/18 2046 .  lisinopril (PRINIVIL,ZESTRIL) tablet 10 mg, 10 mg, Oral, Daily, Gala Romney L, MD, 10 mg at 11/05/18 0906 .  LORazepam (ATIVAN) tablet 0.5 mg, 0.5 mg, Oral, Daily PRN, Gala Romney L, MD, 0.5 mg at 11/04/18 0046 .  multivitamin with minerals tablet  1 tablet, 1 tablet, Oral, Daily, Elwyn Reach, MD, 1 tablet at 11/05/18 0905 .  oxyCODONE-acetaminophen (PERCOCET/ROXICET) 5-325 MG per tablet 1 tablet, 1 tablet, Oral, Q6H PRN, Jonelle Sidle, Mohammad L, MD .  predniSONE (DELTASONE) tablet 5 mg, 5 mg, Oral, Q breakfast, Jonelle Sidle, Mohammad L, MD, 5 mg at 11/05/18 0905 .  senna-docusate (Senokot-S) tablet 1 tablet, 1 tablet, Oral, QHS PRN, Elwyn Reach, MD  Patients Current Diet:      Diet Order                  Diet Heart Room service appropriate? Yes; Fluid consistency: Thin  Diet effective now               Precautions / Restrictions Precautions Precautions: Fall Precaution Comments: R side weakness Restrictions Weight Bearing Restrictions: No   Has the patient had 2 or more falls or a fall with injury in the past year?No  Prior Activity Level Community (5-7x/wk): active PTA; drove, is a retired Therapist, sports in the NICU; on disability since 2012  Prior Functional Level Do you want Prior Function Level of Independence: Independent Comments: reports independent ADLs, IADLs, driving  from other? Self Care: Did the patient need help bathing, dressing, using the toilet or eating?  Independent  Indoor Mobility: Did the patient need assistance with walking from room to room (with or without device)? Independent  Stairs: Did the patient need assistance with internal or external stairs (with or without device)? Independent  Functional Cognition: Did the patient need help planning regular tasks such as shopping or remembering to take medications? Independent  Home  Assistive Devices / Equipment Home Assistive Devices/Equipment: None Home Equipment: Environmental consultant - 2 wheels, Cane - single point, Bedside commode, Shower seat  Prior Device Use: Indicate devices/aids used by the patient prior to current illness, exacerbation or injury? None of the above  Prior Functional Level Vocation: Retired Comments: reports independent ADLs, IADLs, driving    Prior Functional Level Current Functional Level  Bed Mobility Independent Min A  Transfers Independent  Mod A  Mobility - Walk/Wheelchair Independent Min A 80 feet with RW  Mobility - Ambulation/Gait Independent Min assist  Upper Body Dressing Independent Sitting, Minimal assistance  Lower Body Dressing Independent Sit to/from stand, Moderate assistance  Grooming Independent Min guard, Minimal assistance, Standing  Eating/Drinking Independent NT  Toilet Transfer Independent Minimal assistance, Ambulation  Bladder Continence Independent, bladder leakage at baseline Some functional incontinent per pt  Bowel Management  Independent, continent Continent per pt  Stair Climbing Independent NT  Communication WFL No difficulties  Memory WNL Decreased recall of precautions  Cooking/Meal Prep  Independent      Housework  Independent   Money Management  Independent   Driving  Independent     Special needs/care consideration BiPAP/CPAP: no CPM: no Continuous Drip IV: no Dialysis: no        Days: no Life Vest: no Oxygen: no Special Bed: no Trach Size: no Wound Vac (area): no      Location: no Skin: wound to right leg on lateral side above ankle (dressing in place).  Bowel mgmt:continent, last BM (not charted, however pt reports last BM 11/05/18). Bladder mgmt: some leakage at baseline (funcitonal continence) Diabetic mgmt: no  Previous Home Environment Living Arrangements: Spouse/significant other  Lives With: Spouse Available Help at Discharge: Family, Available 24 hours/day Type of  Home: House Home Layout: Two level, Able to live on main level with bedroom/bathroom Home Access: Stairs to enter Entrance  Stairs-Rails: Left Entrance Stairs-Number of Steps: 5 Bathroom Shower/Tub: Multimedia programmer: Handicapped height Home Care Services: No  Discharge Living Setting Plans for Discharge Living Setting: Patient's home, Lives with (comment)(spouse) Type of Home at Discharge: House Discharge Home Layout: Two level, Able to live on main level with bedroom/bathroom Alternate Level Stairs-Rails: None(NA; able to live on main level) Alternate Level Stairs-Number of Steps: NA Discharge Home Access: Stairs to enter Entrance Stairs-Rails: Left Entrance Stairs-Number of Steps: 5 steps Discharge Bathroom Shower/Tub: Walk-in shower(has bench with back) Discharge Bathroom Toilet: Handicapped height Discharge Bathroom Accessibility: Yes How Accessible: Accessible via walker Does the patient have any problems obtaining your medications?: No  Social/Family/Support Systems Patient Roles: Spouse(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[employer]) [place]: Spouse: Freada Bergeron: [phone]; sister: Oscar La [phone]; sister: Jiles Harold [phone] Anticipated Caregiver: spouse Anticipated Caregiver's Contact Information: see above Ability/Limitations of Caregiver: Min A  Caregiver Availability: 24/7 Discharge Plan Discussed with Primary Caregiver: Yes(with husband) Is Caregiver In Agreement with Plan?: Yes Does Caregiver/Family have Issues with Lodging/Transportation while Pt is in Rehab?: No   **Pt is requesting [place] for [facility] needs, if home health is recommended at DC.   Goals/Additional Needs Patient/Family Goal for Rehab: PT/OT: Supervision Expected length of stay: 5-7 days Cultural Considerations: [facility] Dietary Needs: heart healthy, thin liquids Equipment Needs: TBD Pt/Family Agrees to Admission and willing to participate:  Yes Program Orientation Provided & Reviewed with Pt/Caregiver Including Roles  & Responsibilities: Yes(pt and spouse)  Barriers to Discharge: Home environment access/layout, Wound Care  Barriers to Discharge Comments: steps to enter; has wound to R lateral leg above ankle; followed by wound care in [place]   Patient Condition: I have reviewed medical records from [facility], spoken with RN, and the patient and her husband. I met with patient at the bedside and gathered all necessary information required for an inpatient rehabilitation assessment.  Patient will benefit from ongoing PT and OT, can actively participate in 3 hours of therapy a day 5 days of the week, and can make measurable gains during the admission.  Patient will also benefit from the coordinated team approach during an Inpatient Acute Rehabilitation admission.  The patient will receive intensive therapy as well as Rehabilitation physician, nursing, social worker, and care management interventions.  Due to bladder management, safety, skin/wound care, disease management, medical administration, pain management, patient education the patient requires 24 hour a day rehabilitation nursing.  The patient is currently Min A with mobility and basic ADLs.  Discharge setting and therapy post discharge at home with home health is anticipated.  Patient has agreed to participate in the Acute Inpatient Rehabilitation Program and will admit today, 11/05/18.  Preadmission Screen Completed By:  Jhonnie Garner, 11/05/2018 12:11 PM ______________________________________________________________________   Discussed status with Dr. Naaman Plummer on 11/05/18 at 12:03PM and received telephone approval for admission today.  Admission Coordinator:  Jhonnie Garner, time 12:03PM/Date 11/05/18.   Assessment/Plan: Diagnosis: left corona radiata, bg infarct 1. Does the need for close, 24 hr/day  Medical supervision in concert with the patient's rehab needs  make it unreasonable for this patient to be served in a less intensive setting? Yes 2. Co-Morbidities requiring supervision/potential complications: HTN, GERD 3. Due to bladder management, bowel management, safety, skin/wound care, disease management, medication administration, pain management and patient education, does the patient require 24 hr/day rehab nursing? Yes 4. Does the patient require coordinated care of a physician, rehab nurse, PT (1-2 hrs/day, 5 days/week) and OT (  1-2 hrs/day, 5 days/week) to address physical and functional deficits in the context of the above medical diagnosis(es)? Yes Addressing deficits in the following areas: balance, endurance, locomotion, strength, transferring, bowel/bladder control, bathing, dressing, feeding, grooming, toileting and psychosocial support 5. Can the patient actively participate in an intensive therapy program of at least 3 hrs of therapy 5 days a week? Yes 6. The potential for patient to make measurable gains while on inpatient rehab is excellent 7. Anticipated functional outcomes upon discharge from inpatients are: supervision PT, supervision OT, n/a SLP 8. Estimated rehab length of stay to reach the above functional goals is: 5-7 days 9. Anticipated D/C setting: Home 10. Anticipated post D/C treatments: [place] therapy 11. Overall Rehab/Functional Prognosis: good    RECOMMENDATIONS: This patient's condition is appropriate for continued rehabilitative care in the following setting: CIR Patient has agreed to participate in recommended program. Yes Note that insurance prior authorization may be required for reimbursement for recommended care.  Comment:  Meredith Staggers, MD, [place] [facility] 11/05/2018   Jhonnie Garner 11/05/2018        Revision History    Date/Time User Provider Type Action  11/05/2018 1:40 PM Meredith Staggers, MD Physician Sign  11/05/2018 12:13 PM Jhonnie Garner, [facility]  Admission Coordinator Share  11/05/2018 12:12 PM Jhonnie Garner, [place] Admission Coordinator Share  View Details Report

## 2018-11-05 NOTE — Progress Notes (Signed)
Social Work  Social Work Assessment and Plan  Patient Details  Name: [name] MRN: [id number] Date of Birth: 1951-02-03  Today's Date: 11/05/2018  Problem List:  Patient Active Problem List   Diagnosis Date Noted  . Lacunar infarct, acute ([place]) 11/05/2018  . Acute CVA (cerebrovascular accident) ([place]) 11/03/2018  . Ulcer of lower limb ([place]) 01/02/2014  . Chronic venous insufficiency 01/02/2014  . 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 Varicose veins of both lower extremities with complications 25/42/7062  . 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 Lung mass 12/06/2010  . HEADACHE 08/24/2010  . RASH-NONVESICULAR 08/23/2010  . CELLULITIS 08/09/2010  . ANEMIA-IRON DEFICIENCY 02/16/2010  . CARDIAC MURMUR, AORTIC 01/10/2010  . POSTMENOPAUSAL SYNDROME 03/09/2009  . Hyperlipemia 12/07/2008  . PERNICIOUS ANEMIA 12/07/2008  . DEGENERATIVE DISC DISEASE, LUMBOSACRAL SPINE W/RADICULOPATHY 12/07/2008  . FASTING HYPERGLYCEMIA 12/07/2008  . IRON DEFICIENCY ANEMIA SECONDARY TO BLOOD LOSS 02/14/2008  . Essential hypertension 01/01/2008  . ALLERGIC RHINITIS 12/03/2007  . GERD 12/03/2007  . PAIN IN JOINT, SITE UNSPECIFIED 12/03/2007  . SLEEP APNEA 12/03/2007  . HIATAL HERNIA 02/23/2006  . Diverticulosis of colon (without mention of hemorrhage) 12/30/2002   Past Medical History:  Past Medical History:  Diagnosis Date  . Allergic rhinitis   . Anemia    recurrent iron defic.  Marland Kitchen Cervical polyp 03/2003  . DDD (degenerative disc disease)   . Depression    Dr. Toy Care (situational, divorce, loss of parents)  . Diverticulosis   . DVT (deep venous thrombosis) ([facility])    both legs  . Esophageal stricture   . GERD (gastroesophageal reflux disease)   . Hiatal hernia   . Hyperlipidemia   . Hypertension   . Hypertension   . Neuropathy, peripheral   . OSA (obstructive sleep  apnea)   . Osteoarthritis   . Pulmonary embolism ([place])    left lung  . Wegner's disease (congenital syphilitic osteochondritis) 2012   DR[facility] Ginger Organ [facility]   Past Surgical History:  Past Surgical History:  Procedure Laterality Date  . back injection     steroid injection x2  . BRONCHOSCOPY  april 2012  . iron infusion  11/14   seeing hematologist at [facility]  . lung biospy  may 2012  . PARTIAL HIP ARTHROPLASTY Left 03/2014   Social History:  reports that she has never smoked. She has never used smokeless tobacco. She reports that she does not drink alcohol or use drugs.  Family / Support Systems Marital Status: Married Patient Roles: Spouse Spouse/Significant Other: Ignacia Marvel [phone] Other Supports: Antonieta Loveless [phone] Anticipated Caregiver: Husband Ability/Limitations of Caregiver: Supervision-min assist Caregiver Availability: 24/7 Family Dynamics: Close knit with husband who rely upon one another if either of them need something. Husband assisted the last time she was here in 2012. They have friends who are supportive and pt's siblings who will assist if needed.  Social History Preferred language: English Religion: Other Cultural Background: No issues Education: College educated Read: Yes Write: Yes Employment Status: Retired Date Retired/Disabled/Unemployed: 2012 retired Event organiser Issues: No issues Guardian/Conservator: None-according to MD pt is capable of making her own decisions while here. Husband to bring HCPOA when he comes   Abuse/Neglect Abuse/Neglect Assessment Can Be Completed: Yes Physical Abuse: Denies Verbal Abuse: Denies Sexual Abuse: Denies Exploitation of patient/patient's resources: Denies Self-Neglect: Denies  Emotional Status Pt's affect, behavior and adjustment status: Pt has  always been independent and able to take care of herself, and she plans to again. She is making  progress with her affected side and feels she will be able to care for herself again. Her husband will assist but knows how much pt 's independence is important to her. Recent Psychosocial Issues: was going to the wound center in [facility] for her foot issue Psychiatric History: No history deferred depression screening due to coping appropriately at this time and verbalizing her concerns and feelings. Will continue to monitor while here and have neuro-psych see if needed. Substance Abuse History: No issues  Patient / Family Perceptions, Expectations & Goals Pt/Family understanding of illness & functional limitations: Pt and husband have a good understanding of her stroke and deficits. Both have spoken with the MD and feel they have a good understanding of her treatment plan going forward. Pt is also a retired Therapist, sports. Premorbid pt/family roles/activities: Wife, sister, retiree, church member, etc Anticipated changes in roles/activities/participation: resume roles Pt/family expectations/goals: Pt states: " I am doing better and hopefully will do well here and go home soon."   Husband states: " I will do what she needs but know she wants to be independent."  [employer]: Other (Comment)(has had [facility] in the past and prefers them) Premorbid Home Care/DME Agencies: Other (Comment)(rw, cane, bsc, tub bench, wc) Transportation available at discharge: Husband pt was driving prior to admission Resource referrals recommended: Support group (specify)  Discharge Planning Living Arrangements: Spouse/significant other Support Systems: Spouse/significant other, Other relatives, Friends/neighbors, Social worker community Type of Residence: Private residence Insurance Resources: [employer], Multimedia programmer (specify)([employer]) Financial Resources: Social Security, Family Support Financial Screen Referred: No Living Expenses: Own Money Management: Spouse, Patient Does the patient  have any problems obtaining your medications?: No Home Management: Both she and husband Patient/Family Preliminary Plans: Return home with husband who is able to assist and has in the past assisted her. They have siblings and friends who are supportive and will visit and assist if needed. Aware of the rehab program has been here before in 2012. Knows what to expect and will await therapy evaluations. Social Work Anticipated Follow Up Needs: HH/OP, Support Group  Clinical Impression Pleasant female who is making progress from her stroke and recovering. Her husband is supportive and willing to assist if needed. Pt hopes to be able to take care of herself when she leaves here. Will await therapy evaluations and work on any discharge needs.  [name] 11/05/2018, 3:32 PM

## 2018-11-05 NOTE — H&P (Signed)
Physical Medicine and Rehabilitation Admission H&P        Chief Complaint  Patient presents with  . Code Stroke    Chief complaint: Right arm and leg weakness   HPI: [name] 68 year old right-handed female with history of hypertension maintained on HCTZ 12.5 mg daily, hyperlipidemia, GERD,chronic right lower extremity wound 8 weeks followed at the [facility] wound center with weekly changes of UNNA boot, Wegener's granulomatosis maintained on Imuran as well as chronic prednisone 5 mg daily followed by rheumatology at [facility], DVT, obstructive sleep apnea, history of pulmonary emboli on Eliquis.Received inpatient rehabilitation services 2012 debility related to Wegener granulomatosis.. Per chart review patient lives with spouse. Independent prior to admission and driving. Two-level home with bedroom on main level and 5 steps to entry. . Presented 11/03/2018 with right side weakness, facial droop and slurred speech of acute onset. Cranial CT scan suspicious left MCA density in the sylvian fissure. 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 No evidence of associated hemorrhage. MRA negative for large vessel occlusion. Echocardiogram with ejection fraction of 93% normal systolic function. Neurology follow-up patient currently remains on Eliquis as prior to admission as well as the addition of aspirin. Tolerating a regular diet. Bilateral lower extremity Dopplers negative for DVT. Carotid Dopplers with no ICA stenosis. Therapy evaluations completed with recommendations of physical medicine rehabilitation consult. Patient was admitted for a comprehensive rehabilitation program.   Review of Systems  Constitutional: Negative for chills and fever.  HENT: Negative for hearing loss and tinnitus.   Eyes: Negative for blurred vision and double vision.  Respiratory:  Negative for cough and shortness of breath.   Cardiovascular: Positive for leg swelling. Negative for chest pain and palpitations.  Gastrointestinal: Positive for constipation. Negative for heartburn, nausea and vomiting.       GERD  Genitourinary: Negative for flank pain and hematuria.  Musculoskeletal: Positive for joint pain and myalgias.  Skin: Negative for rash.       Chronic right lower extremity wound  Neurological: Positive for speech change and weakness.  Psychiatric/Behavioral: The patient has insomnia.   All other systems reviewed and are negative.       Past Medical History:  Diagnosis Date  . Allergic rhinitis    . Anemia      recurrent iron defic.  Marland Kitchen Cervical polyp 03/2003  . DDD (degenerative disc disease)    . Depression      Dr. Toy Care (situational, divorce, loss of parents)  . Diverticulosis    . DVT (deep venous thrombosis) (HCC)      both legs  . Esophageal stricture    . GERD (gastroesophageal reflux disease)    . Hiatal hernia    . Hyperlipidemia    . Hypertension    . Hypertension    . Neuropathy, peripheral    . OSA (obstructive sleep apnea)    . Osteoarthritis    . Pulmonary embolism ([place])      left lung  . Wegner's disease (congenital syphilitic osteochondritis) 2012    DR. Ginger Organ at [facility]         Past Surgical History:  Procedure Laterality Date  . back injection        steroid injection x2  . BRONCHOSCOPY   april 2012  . iron infusion   11/14    seeing hematologist at [facility]  .  lung biospy   may 2012  . PARTIAL HIP ARTHROPLASTY Left 03/2014         Family History  Problem Relation Age of Onset  . Hypertension Mother    . Neuropathy Mother    . Varicose Veins Mother    . Stroke Father    . Lung cancer Father    . Arthritis Father    . Cancer Father    . Deep vein thrombosis Father    . Hypertension Father    . Varicose Veins Father    . Diabetes Maternal Grandfather    . Diabetes Maternal Uncle    . Diabetes Maternal Aunt    .  Hypertension Sister    . Colon cancer Neg Hx      Social History:  reports that she has never smoked. She has never used smokeless tobacco. She reports that she does not drink alcohol or use drugs. Allergies:       Allergies  Allergen Reactions  . Latex        Sensitive to latex, rash  . Other        Adhesive tape causes rash  . Pregabalin Swelling      Swelling of hands and feet  . Codeine        REACTION: itching          Medications Prior to Admission  Medication Sig Dispense Refill  . acetaminophen (TYLENOL) 325 MG tablet Take 650 mg by mouth as needed for mild pain.       Marland Kitchen apixaban (ELIQUIS) 5 MG TABS tablet Take 2.5 mg by mouth 2 (two) times daily.       Marland Kitchen azaTHIOprine (IMURAN) 50 MG tablet Take 100 mg by mouth daily.      . calcium carbonate (OS-CAL) 600 MG TABS tablet Take 600 mg by mouth daily.       . Cholecalciferol (VITAMIN D PO) Take 1 tablet by mouth 2 (two) times daily.       . DULoxetine (CYMBALTA) 60 MG capsule Take 60 mg by mouth at bedtime.      . gabapentin (NEURONTIN) 600 MG tablet Take 600 mg by mouth 2 (two) times daily.      . hydrochlorothiazide (MICROZIDE) 12.5 MG capsule Take 12.5 mg by mouth daily.       . hydroxyurea (HYDREA) 500 MG capsule Take 500-1,000 mg by mouth See admin instructions. 1000mg  in the morning and 500mg  at night      . ibandronate (BONIVA) 150 MG tablet Take 150 mg by mouth every 30 (thirty) days.       Marland Kitchen lisinopril (PRINIVIL,ZESTRIL) 10 MG tablet Take 10 mg by mouth daily.        Marland Kitchen LORazepam (ATIVAN) 0.5 MG tablet Take 0.5 mg by mouth daily as needed for anxiety.       . Multiple Vitamin (MULTIVITAMIN) tablet Take 1 tablet by mouth daily.        Marland Kitchen oxyCODONE-acetaminophen (PERCOCET/ROXICET) 5-325 MG tablet Take 1 tablet by mouth every 6 (six) hours as needed for pain.      . predniSONE (DELTASONE) 5 MG tablet Take 5 mg by mouth daily.      Marland Kitchen triamcinolone cream (KENALOG) 0.1 % Apply topically 2 (two) times daily.          Drug  Regimen Review Drug regimen was reviewed and remains appropriate with no significant issues identified   Home: Home Living Family/patient expects to be discharged to:: Inpatient rehab Living Arrangements:  Spouse/significant other Available Help at Discharge: Family, Available 24 hours/day Type of Home: House Home Access: Stairs to enter [employer] of Steps: 5 Entrance Stairs-Rails: Left Home Layout: Two level, Able to live on main level with bedroom/bathroom Bathroom Shower/Tub: Multimedia programmer: Handicapped height Home Equipment: Environmental consultant - 2 wheels, [place] - single point, Bedside commode, Shower seat  Lives With: Spouse   Functional History: Prior Function Level of Independence: Independent Comments: reports independent ADLs, IADLs, driving    Functional Status:  Mobility: Bed Mobility Overal bed mobility: Needs Assistance Bed Mobility: Supine to Sit Supine to sit: Min assist General bed mobility comments: increased time, assist for trunk Transfers Overall transfer level: Needs assistance Equipment used: Rolling walker (2 wheeled) Transfers: Sit to/from Stand Sit to Stand: Mod assist General transfer comment: lifting assist from EOB and from low commode in bathroom Ambulation/Gait Ambulation/Gait assistance: Min assist Gait Distance (Feet): 80 Feet Assistive device: Rolling walker (2 wheeled) Gait Pattern/deviations: Step-to pattern, Step-through pattern, Decreased dorsiflexion - right, Decreased stride length, Shuffle, Trunk flexed General Gait Details: skims R foot on floor with her slippers on, able to lift with cues for couple of steps, but degrades back to shuffling foot, assist for safety with walker as R hand weaker, needs assist to keep straight path   ADL: ADL Overall ADL's : Needs assistance/impaired Grooming: Min guard, Minimal assistance, Standing Grooming Details (indicate cue type and reason): increased time and effort to manipulate  toothpaste, cueing to maintain upright posture (R lateral lean)  Upper Body Bathing: Minimal assistance, Sitting Lower Body Bathing: Sit to/from stand, Minimal assistance Lower Body Bathing Details (indicate cue type and reason): requires min assist for safety and balance, decreased functional use of R UE  Upper Body Dressing : Sitting, Minimal assistance Lower Body Dressing: Sit to/from stand, Moderate assistance Lower Body Dressing Details (indicate cue type and reason): min assist in standing, R lateral lean and decreased functional use of R UE  Toilet Transfer: Minimal assistance, Ambulation Toilet Transfer Details (indicate cue type and reason): simulated to recliner, requires cueing for hand placement and safety Functional mobility during ADLs: Minimal assistance, Rolling walker, Min guard, Cueing for safety General ADL Comments: limited by R sided weakness, impaired balance and decreased activity tolerance    Cognition: Cognition Overall Cognitive Status: Impaired/Different from baseline Arousal/Alertness: Awake/alert Orientation Level: Oriented X4 Attention: Sustained Sustained Attention: Appears intact Memory: Appears intact Awareness: Appears intact Problem Solving: Appears intact Safety/Judgment: Appears intact Cognition Arousal/Alertness: Awake/alert Behavior During Therapy: Flat affect Overall Cognitive Status: Impaired/Different from baseline Area of Impairment: Attention, Memory, Safety/judgement, Following commands Current Attention Level: Sustained Memory: Decreased recall of precautions Following Commands: Follows one step commands consistently, Follows one step commands with increased time Safety/Judgement: Decreased awareness of safety Awareness: Emergent Problem Solving: Slow processing, Decreased initiation, Requires verbal cues General Comments: pt requires increased time to process and complete activities; appears close to baseline    Physical Exam: Blood  pressure (!) 141/81, pulse 88, temperature 97.9 F (36.6 C), temperature source Oral, resp. rate 17, height 5\' 3"  (1.6 m), weight 80 kg, last menstrual period 12/20/2002, SpO2 96 %. Physical Exam  Constitutional: She appears well-developed and well-nourished.  HENT:  Head: Normocephalic and atraumatic.  Eyes: Pupils are equal, round, and reactive to light. EOM are normal.  Neck: Normal range of motion.  Cardiovascular: Normal rate. Exam reveals no friction rub.  No murmur heard. Respiratory: Effort normal. No respiratory distress. She has no wheezes.  GI: Soft. She exhibits  no distension. There is no abdominal tenderness.  Musculoskeletal: Normal range of motion.  Neurological:  Pt alert and appropriate. Mild right facial weakness and tongue deviation. Speech is clear. Language is normal. Reasonable insight and awareness. Senses pain and light touch in all 4's. RUE 4-/5. LUE 4/5. RLE 3+ to 4/5 prox to distal and 4/5 prox to distal.      Skin:  Unna boot in place to right lower extremity for chronic wound  Psychiatric: She has a normal mood and affect. Her behavior is normal.            Results for orders placed or performed during the hospital encounter of 11/03/18 (from the past 48 hour(s))  Protime-INR     Status: None    Collection Time: 11/03/18  4:00 PM  Result Value Ref Range    Prothrombin Time 13.4 11.4 - 15.2 seconds    INR 1.0 0.8 - 1.2      Comment: (NOTE) INR goal varies based on device and disease states. Performed at [facility], [place] [address], [place], [place] [address]    APTT     Status: None    Collection Time: 11/03/18  4:00 PM  Result Value Ref Range    aPTT 31 24 - 36 seconds      Comment: Performed at [facility] [address], [place], [place] [address]  CBC     Status: Abnormal    Collection Time: 11/03/18  4:00 PM  Result Value Ref Range    WBC 7.0 4.0 - 10.5 K/uL    RBC 2.71 (L) 3.87 - 5.11 MIL/uL    Hemoglobin 9.7 (L) 12.0 - 15.0  g/dL    HCT 31.5 (L) 36.0 - 46.0 %    MCV 116.2 (H) 80.0 - 100.0 fL    MCH 35.8 (H) 26.0 - 34.0 pg    MCHC 30.8 30.0 - 36.0 g/dL    RDW 16.5 (H) 11.5 - 15.5 %    Platelets 522 (H) 150 - 400 K/uL    nRBC 0.0 0.0 - 0.2 %      Comment: Performed at [facility], [place] [address], [place], [place] [address]  Differential     Status: Abnormal    Collection Time: 11/03/18  4:00 PM  Result Value Ref Range    Neutrophils Relative % 85 %    Neutro Abs 6.0 1.7 - 7.7 K/uL    Lymphocytes Relative 7 %    Lymphs Abs 0.5 (L) 0.7 - 4.0 K/uL    Monocytes Relative 7 %    Monocytes Absolute 0.5 0.1 - 1.0 K/uL    Eosinophils Relative 0 %    Eosinophils Absolute 0.0 0.0 - 0.5 K/uL    Basophils Relative 1 %    Basophils Absolute 0.1 0.0 - 0.1 K/uL    Immature Granulocytes 0 %    Abs Immature Granulocytes 0.02 0.00 - 0.07 K/uL      Comment: Performed at [facility] [address] [place], [place] [address]  Comprehensive metabolic panel     Status: Abnormal    Collection Time: 11/03/18  4:00 PM  Result Value Ref Range    Sodium 143 135 - 145 mmol/L    Potassium 4.1 3.5 - 5.1 mmol/L    Chloride 103 98 - 111 mmol/L    CO2 30 22 - 32 mmol/L    Glucose, Bld 95 70 - 99 mg/dL    BUN  24 (H) 8 - 23 mg/dL    Creatinine, Ser 0.84 0.44 - 1.00 mg/dL    Calcium 9.9 8.9 - 10.3 mg/dL    Total Protein 7.2 6.5 - 8.1 g/dL    Albumin 3.6 3.5 - 5.0 g/dL    AST 23 15 - 41 U/L    ALT 12 0 - 44 U/L    Alkaline Phosphatase 76 38 - 126 U/L    Total Bilirubin 0.3 0.3 - 1.2 mg/dL    GFR calc non Af Amer >60 >60 mL/min    GFR calc Af Amer >60 >60 mL/min    Anion gap 10 5 - 15      Comment: Performed at [facility], [place] [address] [place], [place] [address]  HIV antibody (Routine Testing)     Status: None    Collection Time: 11/03/18  4:00 PM  Result Value Ref Range    HIV Screen 4th Generation wRfx Non Reactive Non Reactive      Comment: (NOTE) Performed At: [facility] [address] [place], [place] [id number] Rush Farmer MD EY:[id number]    I-stat Creatinine, ED     Status: Abnormal    Collection Time: 11/03/18  4:06 PM  Result Value Ref Range    Creatinine, Ser 2.20 (H) 0.44 - 1.00 mg/dL  I-STAT creatinine     Status: Abnormal    Collection Time: 11/03/18  4:06 PM  Result Value Ref Range    Creatinine, Ser 2.20 (H) 0.44 - 1.00 mg/dL      Comment: QUESTIONABLE RESULTS - CHARGE CREDITED REPEATED IN LAB Performed at [facility] [address] [place], [place] [address]    Ethanol     Status: None    Collection Time: 11/03/18  4:30 PM  Result Value Ref Range    Alcohol, Ethyl (B) <10 <10 mg/dL      Comment: (NOTE) Lowest detectable limit for serum alcohol is 10 mg/dL. For medical purposes only. Performed at [facility], [place] [address] [place], [place] [address]    Urine rapid drug screen (hosp performed)     Status: None    Collection Time: 11/03/18  5:51 PM  Result Value Ref Range    Opiates NONE DETECTED NONE DETECTED    Cocaine NONE DETECTED NONE DETECTED    Benzodiazepines NONE DETECTED NONE DETECTED    Amphetamines NONE DETECTED NONE DETECTED    Tetrahydrocannabinol NONE DETECTED NONE DETECTED    Barbiturates NONE DETECTED NONE DETECTED      Comment: (NOTE) DRUG SCREEN FOR MEDICAL PURPOSES ONLY.  IF CONFIRMATION IS NEEDED FOR ANY PURPOSE, NOTIFY LAB WITHIN 5 DAYS. LOWEST DETECTABLE LIMITS FOR URINE DRUG SCREEN Drug Class                     Cutoff (ng/mL) Amphetamine and metabolites    1000 Barbiturate and metabolites    200 Benzodiazepine                 588 Tricyclics and metabolites     300 Opiates and metabolites        300 Cocaine and metabolites        300 THC                            50 Performed at [facility], [place] [address], [place], [place] [address]    Urinalysis, Routine w reflex  microscopic     Status: Abnormal    Collection Time: 11/03/18  5:51 PM  Result Value Ref Range    Color, Urine  YELLOW YELLOW    APPearance CLOUDY (A) CLEAR    Specific Gravity, Urine 1.014 1.005 - 1.030    pH 9.0 (H) 5.0 - 8.0    Glucose, UA NEGATIVE NEGATIVE mg/dL    Hgb urine dipstick NEGATIVE NEGATIVE    Bilirubin Urine NEGATIVE NEGATIVE    Ketones, ur NEGATIVE NEGATIVE mg/dL    Protein, ur NEGATIVE NEGATIVE mg/dL    Nitrite NEGATIVE NEGATIVE    Leukocytes,Ua NEGATIVE NEGATIVE      Comment: Performed at [facility] [address] [place], [place] [address]  Hemoglobin A1c     Status: None    Collection Time: 11/04/18  6:28 AM  Result Value Ref Range    Hgb A1c MFr Bld 5.3 4.8 - 5.6 %      Comment: (NOTE) Pre diabetes:          5.7%-6.4% Diabetes:              >6.4% Glycemic control for   <7.0% adults with diabetes      Mean Plasma Glucose 105.41 mg/dL      Comment: Performed at [facility] [address] [place], [place] [address]  Lipid panel     Status: Abnormal    Collection Time: 11/04/18  6:28 AM  Result Value Ref Range    Cholesterol 132 0 - 200 mg/dL    Triglycerides 150 (H) <150 mg/dL    HDL 43 >40 mg/dL    Total CHOL/HDL Ratio 3.1 RATIO    VLDL 30 0 - 40 mg/dL    LDL Cholesterol 59 0 - 99 mg/dL      Comment:        Total Cholesterol/HDL:CHD Risk Coronary Heart Disease Risk Table                     Men   Women  1/2 Average Risk   3.4   3.3  Average Risk       5.0   4.4  2 X Average Risk   9.6   7.1  3 X Average Risk  23.4   11.0        Use the calculated Patient Ratio above and the CHD Risk Table to determine the patient's CHD Risk.        ATP III CLASSIFICATION (LDL):  <100     mg/dL   Optimal  100-129  mg/dL   Near or Above                    Optimal  130-159  mg/dL   Borderline  160-189  mg/dL   High  >190     mg/dL   Very High Performed at [facility] [address], [place], [place] [address]    CBC     Status: Abnormal    Collection Time: 11/04/18  6:28 AM  Result Value Ref Range    WBC 6.4 4.0 - 10.5 K/uL    RBC 2.58 (L)  3.87 - 5.11 MIL/uL    Hemoglobin 9.2 (L) 12.0 - 15.0 g/dL    HCT 29.0 (L) 36.0 - 46.0 %    MCV 112.4 (H) 80.0 - 100.0 fL    MCH 35.7 (H) 26.0 - 34.0 pg    MCHC 31.7 30.0 - 36.0 g/dL  RDW 16.1 (H) 11.5 - 15.5 %    Platelets 515 (H) 150 - 400 K/uL    nRBC 0.0 0.0 - 0.2 %      Comment: Performed at [facility] [address] [place], [place] [address]  Basic metabolic panel     Status: Abnormal    Collection Time: 11/04/18  6:28 AM  Result Value Ref Range    Sodium 142 135 - 145 mmol/L    Potassium 3.4 (L) 3.5 - 5.1 mmol/L    Chloride 107 98 - 111 mmol/L    CO2 29 22 - 32 mmol/L    Glucose, Bld 92 70 - 99 mg/dL    BUN 16 8 - 23 mg/dL    Creatinine, Ser 0.66 0.44 - 1.00 mg/dL      Comment: RESULT REPEATED AND VERIFIED DELTA CHECK NOTED      Calcium 8.8 (L) 8.9 - 10.3 mg/dL    GFR calc non Af Amer >60 >60 mL/min    GFR calc Af Amer >60 >60 mL/min    Anion gap 6 5 - 15      Comment: Performed at [facility] [address], [place], [place] [address]  Basic metabolic panel     Status: None    Collection Time: 11/05/18  4:33 AM  Result Value Ref Range    Sodium 142 135 - 145 mmol/L    Potassium 3.9 3.5 - 5.1 mmol/L    Chloride 107 98 - 111 mmol/L    CO2 28 22 - 32 mmol/L    Glucose, Bld 91 70 - 99 mg/dL    BUN 13 8 - 23 mg/dL    Creatinine, Ser 0.68 0.44 - 1.00 mg/dL    Calcium 9.4 8.9 - 10.3 mg/dL    GFR calc non Af Amer >60 >60 mL/min    GFR calc Af Amer >60 >60 mL/min    Anion gap 7 5 - 15      Comment: Performed at [facility], [place] [address], [place],  [address]    Mr Jodene Nam Head Wo Contrast   Result Date: 11/03/2018 CLINICAL DATA:  68 year old female code stroke with right side weakness and questionable hyperdense left MCA on plain head CT today. EXAM: MRI HEAD WITHOUT CONTRAST LIMITED MRA HEAD WITHOUT CONTRAST TECHNIQUE: Limited pulse sequences of the brain and surrounding structures were obtained without intravenous contrast. Angiographic  images of the head were obtained using MRA technique without contrast. COMPARISON:  Head CT 1606 hours today . FINDINGS: MRI HEAD FINDINGS DWI and axial FLAIR imaging only was obtained due to acute code stroke presentation. These reveal a small area of restricted diffusion in the posterior left corona radiata and left posterior lentiform along the margins of a chronic lacunar infarct (series 3, images 37 and 38). There is only mild associated FLAIR hyperintensity limited to the lentiform. No other No restricted diffusion or evidence of acute infarction. No midline shift, mass effect, or evidence of intracranial mass lesion. No evidence of intracranial hemorrhage. Scattered additional bilateral cerebral white matter FLAIR hyperintensity. MRA HEAD FINDINGS Antegrade flow in the posterior circulation with mildly dominant distal left vertebral artery. Highly tortuous vertebrobasilar junction but no distal vertebral stenosis. Both PICA origins remain normal. Tortuous basilar artery without stenosis. Normal SCA origins. Fetal type left PCA origin. Normal right PCA origin and posterior communicating artery. Bilateral PCA branches are within normal limits. Antegrade flow in both ICA siphons. Tortuous cervical right ICA just  below the skull base. Mild siphon dolichoectasia without stenosis. Ophthalmic and posterior communicating artery origins appear normal. Patent carotid termini. Normal MCA and ACA origins. Tortuous proximal ACAs. Anterior communicating artery is within normal limits. Visible ACA branches are within normal limits. Right MCA M1, bifurcation, and visible right MCA branches are within normal limits. Left MCA M1 segment is patent to the bifurcation without stenosis. Left MCA bifurcation and visible left MCA branches are within normal limits. IMPRESSION: 1. Negative for large vessel occlusion. 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 2. Positive for acute small vessel infarct along the posterior  margins of the chronic lacune of the left corona radiata and lentiform. No evidence of associated hemorrhage or mass effect. These results were communicated to Dr. Cheral Marker at 6:08 pmon 3/15/2020by text page via the [facility] messaging system. Electronically Signed   By: Genevie Ann M.D.   On: 11/03/2018 18:08    Mr Brain Wo Contrast   Result Date: 11/03/2018 CLINICAL DATA:  68 year old female code stroke with right side weakness and questionable hyperdense left MCA on plain head CT today. EXAM: MRI HEAD WITHOUT CONTRAST LIMITED MRA HEAD WITHOUT CONTRAST TECHNIQUE: Limited pulse sequences of the brain and surrounding structures were obtained without intravenous contrast. Angiographic images of the head were obtained using MRA technique without contrast. COMPARISON:  Head CT 1606 hours today . FINDINGS: MRI HEAD FINDINGS DWI and axial FLAIR imaging only was obtained due to acute code stroke presentation. These reveal a small area of restricted diffusion in the posterior left corona radiata and left posterior lentiform along the margins of a chronic lacunar infarct (series 3, images 37 and 38). There is only mild associated FLAIR hyperintensity limited to the lentiform. No other No restricted diffusion or evidence of acute infarction. No midline shift, mass effect, or evidence of intracranial mass lesion. No evidence of intracranial hemorrhage. Scattered additional bilateral cerebral white matter FLAIR hyperintensity. MRA HEAD FINDINGS Antegrade flow in the posterior circulation with mildly dominant distal left vertebral artery. Highly tortuous vertebrobasilar junction but no distal vertebral stenosis. Both PICA origins remain normal. Tortuous basilar artery without stenosis. Normal SCA origins. Fetal type left PCA origin. Normal right PCA origin and posterior communicating artery. Bilateral PCA branches are within normal limits. Antegrade flow in both ICA siphons. Tortuous cervical right ICA just below the skull base.  Mild siphon dolichoectasia without stenosis. Ophthalmic and posterior communicating artery origins appear normal. Patent carotid termini. Normal MCA and ACA origins. Tortuous proximal ACAs. Anterior communicating artery is within normal limits. Visible ACA branches are within normal limits. Right MCA M1, bifurcation, and visible right MCA branches are within normal limits. Left MCA M1 segment is patent to the bifurcation without stenosis. Left MCA bifurcation and visible left MCA branches are within normal limits. IMPRESSION: 1. Negative for large vessel occlusion. 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 2. Positive for acute small vessel infarct along the posterior margins of the chronic lacune of the left corona radiata and lentiform. No evidence of associated hemorrhage or mass effect. These results were communicated to Dr. Cheral Marker at 6:08 pmon 3/15/2020by text page via the [facility] messaging system. Electronically Signed   By: Genevie Ann M.D.   On: 11/03/2018 18:08    Ct Head Code Stroke Wo Contrast   Result Date: 11/03/2018 CLINICAL DATA:  Code stroke. 69 year old female with right side weakness. EXAM: CT HEAD WITHOUT CONTRAST TECHNIQUE: Contiguous axial images were obtained from the base of the skull through the vertex without intravenous contrast. COMPARISON:  Paranasal sinus  CT 08/26/2010. FINDINGS: Brain: Chronic appearing lacunar infarct of the left corona radiata is new since 2012 on series 3, image 20. Mild additional left hemisphere white matter hypodensity. Vascular: Mild Calcified atherosclerosis at the skull base. Mildly hyperdense left MCA in the sylvian fissure on series 6, images 40 and 41 is conspicuously asymmetric. Skull: No cortically based acute infarct identified. No midline shift, ventriculomegaly, mass effect, evidence of mass lesion, intracranial hemorrhage or evidence of cortically based acute infarction. Sinuses/Orbits: Well pneumatized. Mild bubbly opacity in the right  sphenoid. Tympanic cavities and mastoids are clear. Other: Visualized orbits and scalp soft tissues are within normal limits. ASPECTS [facility] Stroke Program Early CT Score) - Ganglionic level infarction (caudate, lentiform nuclei, internal capsule, insula, M1-M3 cortex): 7 - Supraganglionic infarction (M4-M6 cortex): 3 Total score (0-10 with 10 being normal): 10 IMPRESSION: 1. Suspicious left MCA density in the Sylvian fissure. But no CT changes of cortically based infarct. ASPECTS is 10.   No acute intracranial hemorrhage identified. 2. 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 3. These results were communicated to Dr. Cheral Marker at [place] 3/15/2020by text page via the [facility] messaging system. Electronically Signed   By: Genevie Ann M.D.   On: 11/03/2018 16:17    Vas US Carotid (at [place] Only)   Result Date: 11/04/2018 Carotid Arterial Duplex Study Indications:  CVA and Weakness. Risk Factors: Hypertension, hyperlipidemia. Performing Technologist: Sharion Dove RVS  Examination Guidelines: A complete evaluation includes B-mode imaging, spectral Doppler, color Doppler, and power Doppler as needed of all accessible portions of each vessel. Bilateral testing is considered an integral part of a complete examination. Limited examinations for reoccurring indications may be performed as noted.  Right Carotid Findings: +----------+--------+--------+--------+------------+--------+           PSV cm/sEDV cm/sStenosisDescribe    Comments +----------+--------+--------+--------+------------+--------+ CCA Prox  105     17              homogeneous          +----------+--------+--------+--------+------------+--------+ CCA Distal81      23              homogeneous          +----------+--------+--------+--------+------------+--------+ ICA Prox  86      27              heterogenous         +----------+--------+--------+--------+------------+--------+ ICA Distal69       23                                   +----------+--------+--------+--------+------------+--------+ ECA       81      17                                   +----------+--------+--------+--------+------------+--------+ +----------+--------+-------+--------+-------------------+           PSV cm/sEDV cmsDescribeArm Pressure (mmHG) +----------+--------+-------+--------+-------------------+ [id number]                                         +----------+--------+-------+--------+-------------------+ +---------+--------+--+--------+--+ VertebralPSV cm/s41EDV cm/s15 +---------+--------+--+--------+--+  Left Carotid Findings: +----------+--------+--------+--------+------------+--------+           PSV cm/sEDV cm/sStenosisDescribe    Comments +----------+--------+--------+--------+------------+--------+ CCA Prox  87      22              homogeneous          +----------+--------+--------+--------+------------+--------+ CCA Distal70      22              homogeneous          +----------+--------+--------+--------+------------+--------+ ICA Prox  82      33              heterogenous         +----------+--------+--------+--------+------------+--------+ ICA Distal118     43                                   +----------+--------+--------+--------+------------+--------+ ECA       93      18                                   +----------+--------+--------+--------+------------+--------+ +----------+--------+--------+--------+-------------------+ SubclavianPSV cm/sEDV cm/sDescribeArm Pressure (mmHG) +----------+--------+--------+--------+-------------------+           184                                         +----------+--------+--------+--------+-------------------+ +---------+--------+--+--------+--+ VertebralPSV cm/s77EDV cm/s19 +---------+--------+--+--------+--+  Summary: Right Carotid: Velocities in the right ICA are consistent with a  1-39% stenosis. Left Carotid: Velocities in the left ICA are consistent with a 1-39% stenosis. Vertebrals:  Bilateral vertebral arteries demonstrate antegrade flow. Subclavians: Normal flow hemodynamics were seen in bilateral subclavian              arteries. *See table(s) above for measurements and observations.  Electronically signed by Antony Contras MD on 11/04/2018 at 1:29:47 PM.    Final     Vas [place] Lower Extremity Venous (dvt)   Result Date: 11/04/2018  Lower Venous Study Indications: Stroke.  Limitations: Bandages and body habitus. Comparison Study: No prior study on file Performing Technologist: Sharion Dove RVS  Examination Guidelines: A complete evaluation includes B-mode imaging, spectral Doppler, color Doppler, and power Doppler as needed of all accessible portions of each vessel. Bilateral testing is considered an integral part of a complete examination. Limited examinations for reoccurring indications may be performed as noted.  Right Venous Findings: +---------+---------------+---------+-----------+----------+------------------+          CompressibilityPhasicitySpontaneityPropertiesSummary            +---------+---------------+---------+-----------+----------+------------------+ CFV      Full                                                            +---------+---------------+---------+-----------+----------+------------------+ SFJ      Full                                                            +---------+---------------+---------+-----------+----------+------------------+ FV Prox  Full                                                            +---------+---------------+---------+-----------+----------+------------------+  FV Mid   Full                                                            +---------+---------------+---------+-----------+----------+------------------+ FV DistalFull                                                             +---------+---------------+---------+-----------+----------+------------------+ PFV      Full                                                            +---------+---------------+---------+-----------+----------+------------------+ POP      Full                                                            +---------+---------------+---------+-----------+----------+------------------+ PTV      Full                                         proximal portion                                                         only               +---------+---------------+---------+-----------+----------+------------------+ PERO                                                  Not visualized     +---------+---------------+---------+-----------+----------+------------------+ GSV      Full                                                            +---------+---------------+---------+-----------+----------+------------------+ Patient with Ardelia Mems boot  Right Technical Findings: Not visualized segments include peroneal vein and segments of the posterior tibial.  Left Venous Findings: +---------+---------------+---------+-----------+----------+-------+          CompressibilityPhasicitySpontaneityPropertiesSummary +---------+---------------+---------+-----------+----------+-------+ CFV      Full           Yes      Yes                          +---------+---------------+---------+-----------+----------+-------+ SFJ      Full                                                 +---------+---------------+---------+-----------+----------+-------+  FV Prox  Full                                                 +---------+---------------+---------+-----------+----------+-------+ FV Mid   Full                                                 +---------+---------------+---------+-----------+----------+-------+ FV DistalFull                                                  +---------+---------------+---------+-----------+----------+-------+ PFV      Full                                                 +---------+---------------+---------+-----------+----------+-------+ POP      Full           Yes      Yes                          +---------+---------------+---------+-----------+----------+-------+ PTV      Full                                                 +---------+---------------+---------+-----------+----------+-------+  Left Technical Findings: Not visualized segments include peroneal vein.   Summary: Right: There is no evidence of deep vein thrombosis in the lower extremity. However, portions of this examination were limited- see technologist comments above. Left: There is no evidence of deep vein thrombosis in the lower extremity. However, portions of this examination were limited- see technologist comments above.  *See table(s) above for measurements and observations. Electronically signed by Servando Snare MD on 11/04/2018 at 2:18:42 PM.    Final             Medical Problem List and Plan: 1.  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  Antithrombotics: -DVT/anticoagulation:  Eliquis. Venous Dopplers negative             -antiplatelet therapy:  Aspirin 81 mg daily 3. Pain Management:  Neurontin 600 mg twice a day,Cymbalta 60 mg daily at bedtime, oxycodone as needed 4. Mood:  Ativan 0.5 mg daily as needed             -antipsychotic agents: see above 5. Neuropsych: This patient is capable of making decisions on her own behalf. 6. Skin/Wound Care/chronic right lower extremity ulcer 8 weeks with weekly changes of UNNA boots followed at the [facility] wound center. Routine skin checks. Consult WOC for [employer] change 7. Fluids/Electrolytes/Nutrition:  Routine in and  out's with follow-up chemistries 8. Hypertension. Lisinopril 10 mg daily 9. Wegener's granulomatosis. Continue  home regimen of Imuran 100 mg daily, prednisone 5 mg daily 10. Hyperlipidemia. Lipitor       Cathlyn Parsons, PA-C 11/05/2018  Post Admission Physician Evaluation: 1. Functional deficits secondary  to left CR/BG infarct. 2. Patient is admitted to receive collaborative, interdisciplinary care between the physiatrist, rehab nursing staff, and therapy team. 3. Patient's level of medical complexity and substantial therapy needs in context of that medical necessity cannot be provided at a lesser intensity of care such as a SNF. 4. Patient has experienced substantial functional loss from his/her baseline which was documented above under the "Functional History" and "Functional Status" headings.  Judging by the patient's diagnosis, physical exam, and functional history, the patient has potential for functional progress which will result in measurable gains while on inpatient rehab.  These gains will be of substantial and practical use upon discharge  in facilitating mobility and self-care at the household level. 5. Physiatrist will provide 24 hour management of medical needs as well as oversight of the therapy plan/treatment and provide guidance as appropriate regarding the interaction of the two. 6. The Preadmission Screening has been reviewed and patient status is unchanged unless otherwise stated above. 7. 24 hour rehab nursing will assist with bladder management, bowel management, safety, skin/wound care, disease management, medication administration, pain management and patient education  and help integrate therapy concepts, techniques,education, etc. 8. PT will assess and treat for/with: Lower extremity strength, range of motion, stamina, balance, functional mobility, safety, adaptive techniques and equipment, NMR, community reentry, family ed.   Goals are: supervision to mod I. 9. OT  will assess and treat for/with: ADL's, functional mobility, safety, upper extremity strength, adaptive techniques and equipment, NMR, family ed, community reentry.   Goals are: supervision to mod I. Therapy may proceed with showering this patient. 10. SLP will assess and treat for/with: n/a.  Goals are: n/a. 11. Case Management and Social Worker will assess and treat for psychological issues and discharge planning. 12. Team conference will be held weekly to assess progress toward goals and to determine barriers to discharge. 13. Patient will receive at least 3 hours of therapy per day at least 5 days per week. 14. ELOS: 6-10 days       15. Prognosis:  excellent   I have personally performed a face to face diagnostic evaluation of this patient and formulated the key components of the plan.  Additionally, I have personally reviewed laboratory data, imaging studies, as well as relevant notes and concur with the physician assistant's documentation above.  Meredith Staggers, MD, Mellody Drown

## 2018-11-05 NOTE — TOC Transition Note (Signed)
Transition of Care [facility]) - CM/SW Discharge Note   Patient Details  Name: [name] MRN: [id number] Date of Birth: [birth year]  [facility] CM/SW Contact:  Pollie Friar, RN Phone Number: 11/05/2018, 12:06 PM   Clinical Narrative:    Pt is d/cing to CIR. CM called and updated Butch Penny with [facility]. They will follow for needs post rehab.    Final next level of care: IP Rehab Facility Barriers to Discharge: No Barriers Identified   Patient Goals and CMS Choice     Choice offered to / list presented to : Patient  Discharge Placement                       Discharge Plan and Services Discharge Planning Services: CM Consult Post Acute Care Choice: Home Health              HH Arranged: PT, OT [facility] Agency: [place] [facility]   Social Determinants of Health (SDOH) Interventions     Readmission Risk Interventions No flowsheet data found.

## 2018-11-05 NOTE — PMR Pre-admission (Signed)
PMR Admission Coordinator Pre-Admission Assessment  Patient: [name] is an 68 y.o., female MRN: [id number] DOB: 1950-10-29 Height: 5' 3"  (160 cm) Weight: 80 kg  Insurance Information HMO:     PPO:      PCP:      IPA:      80/20: Yes     OTHER:  PRIMARY: Medicare Part A and B       Policy#: [id number]      Subscriber: Patient CM Name:       Phone#:      Fax#:  Pre-Cert#:       Employer:  Benefits:  Phone #: NA     Name: Verified eligibility via [place] on 11/05/18 Eff. Date: Part A 04/21/13; Part B 04/21/13     Deduct: $1,408      Out of Pocket Max: NA      Life Max: NA CIR: Covered per Medicare guidelines once yearly deductible is met      SNF: days 1-20, 100%, days 21-100, 80% Outpatient: 80%     Co-Pay: 20% Home Health: 100%      Co-Pay:  DME: 80%     Co-Pay: 20% Providers: Pt's choice  SECONDARY: [employer]      Policy#: [id number]      Subscriber: patient CM Name:       Phone#:      Fax#:  Pre-Cert#:       Employer:  Benefits:  Phone #: [phone]     Name:  Eff. 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 Received inpatient rehabilitation services 2012 debility related to Wegener granulomatosis. Pt presented 11/03/2018 with right side weakness, facial droop and slurred speech of acute onset. Cranial CT scan suspicious left MCA density in the sylvian fissure. Patient did not receive TPA. MRI showed acute small vessel infarction along the posterior margins of the chronic lacune of the left corona radiata and lentiform; No evidence of associated hemorrhage. MRA negative for large vessel occlusion. Echocardiogram with ejection fraction of 30% normal systolic function. Bilateral lower extremity Dopplers negative for DVT. Carotid Dopplers with no ICA stenosis. Therapy evaluations completed with recommendations for CIR. Patient is to be admitted for a comprehensive rehabilitation program on 11/05/18.  Patient's medical record from [facility] has been reviewed by the rehabilitation admission coordinator and physician. NIH Stroke scale: 1  Past Medical History  Past Medical History:  Diagnosis Date  . Allergic rhinitis   . Anemia    recurrent iron defic.  Marland Kitchen Cervical polyp 03/2003  . DDD (degenerative disc disease)   . Depression    Dr. Toy Care (situational, divorce, loss of parents)  . Diverticulosis   . DVT (deep venous thrombosis) (HCC)    both legs  . Esophageal stricture   . GERD (gastroesophageal reflux disease)   . Hiatal hernia   . Hyperlipidemia   . Hypertension   . Hypertension   . Neuropathy, peripheral   . OSA (obstructive sleep apnea)   . Osteoarthritis   . Pulmonary embolism ([place])    left lung  . Wegner's disease (congenital syphilitic osteochondritis) 2012   DR. Ginger Organ at [facility] History   family history includes Arthritis in her father; Cancer in her father; Deep vein thrombosis in her father; Diabetes in her maternal aunt, maternal grandfather, and maternal uncle; Hypertension in her  father, mother, and sister; Lung cancer in her father; Neuropathy in her mother; Stroke in her father; Varicose Veins in her father and mother.  Prior Rehab/Hospitalizations Has the patient had major surgery during 100 days prior to admission? No    Current Medications  Current Facility-Administered Medications:  .  acetaminophen (TYLENOL) tablet 650 mg, 650 mg, Oral, Q4H PRN, 650 mg at 11/05/18 0517 **OR** acetaminophen (TYLENOL) solution 650 mg, 650 mg, Per Tube, Q4H PRN **OR** acetaminophen (TYLENOL) suppository 650 mg, 650 mg, Rectal, Q4H PRN, Jonelle Sidle, Mohammad L, MD .  apixaban (ELIQUIS) tablet 2.5 mg, 2.5 mg, Oral, BID, Jonelle Sidle, Mohammad L, MD, 2.5 mg at 11/05/18 0905 .  aspirin EC tablet 81 mg, 81 mg, Oral, Daily, Biby, Sharon L, NP, 81 mg at 11/05/18 0906 .  atorvastatin (LIPITOR) tablet 40 mg, 40 mg, Oral, q1800, Gala Romney L, MD, 40 mg at 11/04/18 1857 .  azaTHIOprine (IMURAN) tablet 100 mg, 100 mg, Oral, Daily, Jonelle Sidle, Mohammad L, MD, 100 mg at 11/05/18 0905 .  calcium carbonate (OS-CAL - dosed in mg of elemental calcium) tablet 1,250 mg, 1,250 mg, Oral, Q breakfast, Jonelle Sidle, Mohammad L, MD, 1,250 mg at 11/05/18 0905 .  cholecalciferol (VITAMIN D3) tablet 1,000 Units, 1,000 Units, Oral, BID, Elwyn Reach, MD, 1,000 Units at 11/05/18 0905 .  DULoxetine (CYMBALTA) DR capsule 60 mg, 60 mg, Oral, QHS, Garba, Mohammad L, MD, 60 mg at 11/04/18 2045 .  gabapentin (NEURONTIN) capsule 600 mg, 600 mg, Oral, BID, Jonelle Sidle, Mohammad L, MD, 600 mg at 11/05/18 0905 .  hydroxyurea (HYDREA) capsule 1,000 mg, 1,000 mg, Oral, Daily, 1,000 mg at 11/05/18 0911 **AND** hydroxyurea (HYDREA) capsule 500 mg, 500 mg, Oral, QHS, Garba, Mohammad L, MD, 500 mg at 11/04/18 2046 .  lisinopril (PRINIVIL,ZESTRIL) tablet 10 mg, 10 mg, Oral, Daily, Gala Romney L, MD, 10 mg at 11/05/18 0906 .  LORazepam (ATIVAN) tablet 0.5 mg, 0.5 mg, Oral, Daily PRN, Gala Romney L, MD, 0.5 mg at 11/04/18 0046 .  multivitamin  with minerals tablet 1 tablet, 1 tablet, Oral, Daily, Elwyn Reach, MD, 1 tablet at 11/05/18 0905 .  oxyCODONE-acetaminophen (PERCOCET/ROXICET) 5-325 MG per tablet 1 tablet, 1 tablet, Oral, Q6H PRN, Jonelle Sidle, Mohammad L, MD .  predniSONE (DELTASONE) tablet 5 mg, 5 mg, Oral, Q breakfast, Jonelle Sidle, Mohammad L, MD, 5 mg at 11/05/18 0905 .  senna-docusate (Senokot-S) tablet 1 tablet, 1 tablet, Oral, QHS PRN, Elwyn Reach, MD  Patients Current Diet:   Diet Order  Diet Heart Room service appropriate? Yes; Fluid consistency: Thin  Diet effective now              Precautions / Restrictions Precautions Precautions: Fall Precaution Comments: R side weakness Restrictions Weight Bearing Restrictions: No   Has the patient had 2 or more falls or a fall with injury in the past year?No  Prior Activity Level Community (5-7x/wk): active PTA; drove, is a retired Therapist, sports in the NICU; on disability since 2012  Prior Functional Level Do you want Prior Function Level of Independence: Independent Comments: reports independent ADLs, IADLs, driving  from other? Self Care: Did the patient need help bathing, dressing, using the toilet or eating?  Independent  Indoor Mobility: Did the patient need assistance with walking from room to room (with or without device)? Independent  Stairs: Did the patient need assistance with internal or external stairs (with or without device)? Independent  Functional Cognition: Did the patient need help planning regular tasks such as shopping or remembering to take medications? Independent  Home Assistive Devices / Equipment Home Assistive Devices/Equipment: None Home Equipment: Environmental consultant - 2 wheels, Cane - single point, Bedside commode, Shower seat  Prior Device Use: Indicate devices/aids used by the patient prior to current illness, exacerbation or injury? None of the above  Prior Functional Level Vocation: Retired Comments: reports independent ADLs, IADLs,  driving    Prior Functional Level Current Functional Level  Bed Mobility Independent Min A  Transfers Independent  Mod A  Mobility - Walk/Wheelchair Independent Min A 80 feet with RW  Mobility - Ambulation/Gait Independent Min assist  Upper Body Dressing Independent Sitting, Minimal assistance  Lower Body Dressing Independent Sit to/from stand, Moderate assistance  Grooming Independent Min guard, Minimal assistance, Standing  Eating/Drinking Independent NT  Toilet Transfer Independent Minimal assistance, Ambulation  Bladder Continence Independent, bladder leakage at baseline Some functional incontinent per pt  Bowel Management  Independent, continent Continent per pt  Stair Climbing Independent NT  Communication WFL No difficulties  Memory WNL Decreased recall of precautions  Cooking/Meal Prep  Independent      Housework  Independent   Money Management  Independent   Driving  Independent     Special needs/care consideration BiPAP/CPAP: no CPM: no Continuous Drip IV: no Dialysis: no        Days: no Life Vest: no Oxygen: no Special Bed: no Trach Size: no Wound Vac (area): no      Location: no Skin: wound to right leg on lateral side above ankle (dressing in place).  Bowel mgmt:continent, last BM (not charted, however pt reports last BM 11/05/18). Bladder mgmt: some leakage at baseline (funcitonal continence) Diabetic mgmt: no  Previous Home Environment Living Arrangements: Spouse/significant other  Lives With: Spouse Available Help at Discharge: Family, Available 24 hours/day Type of Home: House Home Layout: Two level, Able to live on main level with bedroom/bathroom Home Access: Stairs to enter Entrance Stairs-Rails: Left Entrance Stairs-Number of Steps: 5 Bathroom Shower/Tub: Multimedia programmer: Handicapped height Harman: No  Discharge Living Setting Plans for Discharge Living Setting: Patient's home, Lives with (comment)(spouse) Type  of Home at Discharge: House Discharge Home Layout: Two level, Able to live on main level with bedroom/bathroom Alternate Level Stairs-Rails: None(NA; able to live on main level) Alternate Level Stairs-Number of Steps: NA Discharge Home Access: Stairs to enter Entrance Stairs-Rails: Left Entrance Stairs-Number of Steps: 5 steps Discharge Bathroom Shower/Tub: Walk-in shower(has bench with back) Discharge Bathroom Toilet: Handicapped height Discharge Bathroom Accessibility: Yes  How Accessible: Accessible via walker Does the patient have any problems obtaining your medications?: No  Social/Family/Support Systems Patient Roles: Spouse(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[employer]) Contact Information: Spouse: Freada Bergeron: [phone]; sister: Oscar La [phone]; sister: Jiles Harold [phone] Anticipated Caregiver: spouse Anticipated Caregiver's Contact Information: see above Ability/Limitations of Caregiver: Min A  Caregiver Availability: 24/7 Discharge Plan Discussed with Primary Caregiver: Yes(with husband) Is Caregiver In Agreement with Plan?: Yes Does Caregiver/Family have Issues with Lodging/Transportation while Pt is in Rehab?: No   **Pt is requesting [place] for [facility] needs, if home health is recommended at DC.   Goals/Additional Needs Patient/Family Goal for Rehab: PT/OT: Supervision Expected length of stay: 5-7 days Cultural Considerations: [facility] Dietary Needs: heart healthy, thin liquids Equipment Needs: TBD Pt/Family Agrees to Admission and willing to participate: Yes Program Orientation Provided & Reviewed with Pt/Caregiver Including Roles  & Responsibilities: Yes(pt and spouse)  Barriers to Discharge: Home environment access/layout, Wound Care  Barriers to Discharge Comments: steps to enter; has wound to R lateral leg above ankle; followed by wound care in [place]   Patient Condition: I have reviewed medical records from [facility],  spoken with RN, and the patient and her husband. I met with patient at the bedside and gathered all necessary information required for an inpatient rehabilitation assessment.  Patient will benefit from ongoing PT and OT, can actively participate in 3 hours of therapy a day 5 days of the week, and can make measurable gains during the admission.  Patient will also benefit from the coordinated team approach during an Inpatient Acute Rehabilitation admission.  The patient will receive intensive therapy as well as Rehabilitation physician, nursing, social worker, and care management interventions.  Due to bladder management, safety, skin/wound care, disease management, medical administration, pain management, patient education the patient requires 24 hour a day rehabilitation nursing.  The patient is currently Min A with mobility and basic ADLs.  Discharge setting and therapy post discharge at home with home health is anticipated.  Patient has agreed to participate in the Acute Inpatient Rehabilitation Program and will admit today, 11/05/18.  Preadmission Screen Completed By:  Jhonnie Garner, 11/05/2018 12:11 PM ______________________________________________________________________   Discussed status with Dr. Naaman Plummer on 11/05/18 at 12:03PM and received telephone approval for admission today.  Admission Coordinator:  Jhonnie Garner, time 12:03PM/Date 11/05/18.   Assessment/Plan: Diagnosis: left corona radiata, bg infarct 1. Does the need for close, 24 hr/day  Medical supervision in concert with the patient's rehab needs make it unreasonable for this patient to be served in a less intensive setting? Yes 2. Co-Morbidities requiring supervision/potential complications: HTN, GERD 3. Due to bladder management, bowel management, safety, skin/wound care, disease management, medication administration, pain management and patient education, does the patient require 24 hr/day rehab nursing? Yes 4. Does the patient require  coordinated care of a physician, rehab nurse, PT (1-2 hrs/day, 5 days/week) and OT (1-2 hrs/day, 5 days/week) to address physical and functional deficits in the context of the above medical diagnosis(es)? Yes Addressing deficits in the following areas: balance, endurance, locomotion, strength, transferring, bowel/bladder control, bathing, dressing, feeding, grooming, toileting and psychosocial support 5. Can the patient actively participate in an intensive therapy program of at least 3 hrs of therapy 5 days a week? Yes 6. The potential for patient to make measurable gains while on inpatient rehab is excellent 7. Anticipated functional outcomes upon discharge from inpatients are: supervision PT, supervision OT, n/a SLP 8. Estimated rehab length of stay to  reach the above functional goals is: 5-7 days 9. Anticipated D/C setting: Home 10. Anticipated post D/C treatments: [place] therapy 11. Overall Rehab/Functional Prognosis: good    RECOMMENDATIONS: This patient's condition is appropriate for continued rehabilitative care in the following setting: CIR Patient has agreed to participate in recommended program. Yes Note that insurance prior authorization may be required for reimbursement for recommended care.  Comment:  Meredith Staggers, MD, [facility] 11/05/2018   Jhonnie Garner 11/05/2018

## 2018-11-06 ENCOUNTER — Inpatient Hospital Stay (HOSPITAL_COMMUNITY): Payer: Medicare Other | Admitting: Occupational Therapy

## 2018-11-06 ENCOUNTER — Inpatient Hospital Stay (HOSPITAL_COMMUNITY): Payer: Medicare Other | Admitting: Physical Therapy

## 2018-11-06 DIAGNOSIS — G8191 Hemiplegia, unspecified affecting right dominant side: Secondary | ICD-10-CM

## 2018-11-06 LAB — COMPREHENSIVE METABOLIC PANEL
ALT: 10 U/L (ref 0–44)
AST: 18 U/L (ref 15–41)
Albumin: 3 g/dL — ABNORMAL LOW (ref 3.5–5.0)
Alkaline Phosphatase: 61 U/L (ref 38–126)
Anion gap: 6 (ref 5–15)
BUN: 14 mg/dL (ref 8–23)
CO2: 29 mmol/L (ref 22–32)
Calcium: 9.5 mg/dL (ref 8.9–10.3)
Chloride: 106 mmol/L (ref 98–111)
Creatinine, Ser: 0.68 mg/dL (ref 0.44–1.00)
GFR calc Af Amer: >60 mL/min (ref >60)
GFR calc non Af Amer: >60 mL/min (ref >60)
Glucose, Bld: 94 mg/dL (ref 70–99)
Potassium: 3.6 mmol/L (ref 3.5–5.1)
Sodium: 141 mmol/L (ref 135–145)
Total Bilirubin: 0.5 mg/dL (ref 0.3–1.2)
Total Protein: 6.4 g/dL — ABNORMAL LOW (ref 6.5–8.1)

## 2018-11-06 LAB — CBC WITH DIFFERENTIAL/PLATELET
Abs Immature Granulocytes: 0.02 10*3/uL (ref 0.00–0.07)
Basophils Absolute: 0 10*3/uL (ref 0.0–0.1)
Basophils Relative: 1 %
Eosinophils Absolute: 0 10*3/uL (ref 0.0–0.5)
Eosinophils Relative: 1 %
HCT: 30.9 % — ABNORMAL LOW (ref 36.0–46.0)
Hemoglobin: 9.6 g/dL — ABNORMAL LOW (ref 12.0–15.0)
Immature Granulocytes: 0 %
Lymphocytes Relative: 15 %
Lymphs Abs: 0.7 10*3/uL (ref 0.7–4.0)
MCH: 34.7 pg — ABNORMAL HIGH (ref 26.0–34.0)
MCHC: 31.1 g/dL (ref 30.0–36.0)
MCV: 111.6 fL — ABNORMAL HIGH (ref 80.0–100.0)
Monocytes Absolute: 0.6 10*3/uL (ref 0.1–1.0)
Monocytes Relative: 13 %
Neutro Abs: 3.3 10*3/uL (ref 1.7–7.7)
Neutrophils Relative %: 70 %
Platelets: 538 10*3/uL — ABNORMAL HIGH (ref 150–400)
RBC: 2.77 MIL/uL — ABNORMAL LOW (ref 3.87–5.11)
RDW: 15.8 % — ABNORMAL HIGH (ref 11.5–15.5)
WBC: 4.7 10*3/uL (ref 4.0–10.5)
nRBC: 0 % (ref 0.0–0.2)

## 2018-11-06 NOTE — Evaluation (Signed)
Occupational Therapy Assessment and Plan  Patient Details  Name: [name] MRN: [id number] Date of Birth: 1951-05-20  OT Diagnosis: hemiplegia affecting non-dominant side and muscle weakness (generalized) Rehab Potential: Rehab Potential (ACUTE ONLY): Excellent ELOS: 5-7 days   Today's Date: 11/06/2018 OT Individual Time: 0820-0920 OT Individual Time Calculation (min): 60 min     Problem List:  Patient Active Problem List   Diagnosis Date Noted  . Lacunar infarct, acute ([place]) 11/05/2018  . Acute CVA (cerebrovascular accident) ([place]) 11/03/2018  . Ulcer of lower limb ([place]) 01/02/2014  . Chronic venous insufficiency 01/02/2014  . 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 Varicose veins of both lower extremities with complications 38/75/6433  . Wegener's granulomatosis with vasculitis ([place]) 07/21/2013  . Alveolar pneumopathy ([place]) 02/11/2011  . Lung mass 12/06/2010  . HEADACHE 08/24/2010  . RASH-NONVESICULAR 08/23/2010  . CELLULITIS 08/09/2010  . ANEMIA-IRON DEFICIENCY 02/16/2010  . CARDIAC MURMUR, AORTIC 01/10/2010  . POSTMENOPAUSAL SYNDROME 03/09/2009  . Hyperlipemia 12/07/2008  . PERNICIOUS ANEMIA 12/07/2008  . DEGENERATIVE DISC DISEASE, LUMBOSACRAL SPINE W/RADICULOPATHY 12/07/2008  . FASTING HYPERGLYCEMIA 12/07/2008  . IRON DEFICIENCY ANEMIA SECONDARY TO BLOOD LOSS 02/14/2008  . Essential hypertension 01/01/2008  . ALLERGIC RHINITIS 12/03/2007  . GERD 12/03/2007  . PAIN IN JOINT, SITE UNSPECIFIED 12/03/2007  . SLEEP APNEA 12/03/2007  . HIATAL HERNIA 02/23/2006  . Diverticulosis of colon (without mention of hemorrhage) 12/30/2002    Past Medical History:  Past Medical History:  Diagnosis Date  . Allergic rhinitis   . Anemia    recurrent iron defic.  Marland Kitchen Cervical polyp 03/2003  . DDD (degenerative disc disease)   . Depression    Dr. Toy Care (situational, divorce, loss of parents)  . Diverticulosis   . DVT (deep  venous thrombosis) ([facility])    both legs  . Esophageal stricture   . GERD (gastroesophageal reflux disease)   . Hiatal hernia   . Hyperlipidemia   . Hypertension   . Hypertension   . Neuropathy, peripheral   . OSA (obstructive sleep apnea)   . Osteoarthritis   . Pulmonary embolism ([place])    left lung  . Wegner's disease (congenital syphilitic osteochondritis) 2012   DR[facility] Ginger Organ [facility]   Past Surgical History:  Past Surgical History:  Procedure Laterality Date  . back injection     steroid injection x2  . BRONCHOSCOPY  april 2012  . iron infusion  11/14   seeing hematologist at [facility]  . lung biospy  may 2012  . PARTIAL HIP ARTHROPLASTY Left 03/2014    Assessment & Plan Clinical Impression: Patient is a 68 y.o. left-handed female with history of hypertension maintained on HCTZ 12.5 mg daily, hyperlipidemia, GERD,chronic right lower extremity wound 8 weeks followed at the [facility] wound center with weekly changes of UNNA boot,Wegener's granulomatosis maintained on Imuran as well as chronic prednisone 5 mg daily followed by rheumatology at [facility], DVT, obstructive sleep apnea, history of pulmonary emboli on Eliquis.Received inpatient rehabilitation services 2012 debility related to Wegener granulomatosis..Per chart review patient lives with spouse. Independent prior to admission and driving. Two-level home with bedroom on main level and 5 steps to entry. . Presented 11/03/2018 with right side weakness, facial droop and slurred speech of acute onset. Cranial CT scan suspicious left MCA density in the sylvian fissure. 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 No  evidence of associated hemorrhage. MRA negative for large vessel occlusion. Echocardiogram with ejection fraction of 70% normal systolic function. Neurology follow-up patient currently remains on Eliquis  as prior to admission as well as the addition of aspirin. Tolerating a regular diet. Bilateral lower extremity Dopplers negative for DVT. Carotid Dopplers with no ICA stenosis. Therapy evaluations completed with recommendations of physical medicine rehabilitation consult. Patient was admitted for a comprehensive rehabilitation program.   Patient transferred to CIR on 11/05/2018 .    Patient currently requires min-mod with basic self-care skills secondary to muscle weakness, unbalanced muscle activation and decreased coordination and decreased standing balance and decreased balance strategies.  Prior to hospitalization, patient could complete ADLs with independent .  Patient will benefit from skilled intervention to increase independence with basic self-care skills and increase level of independence with iADL prior to discharge home with care partner.  Anticipate patient will require intermittent supervision and follow up outpatient.  OT - End of Session Activity Tolerance: Tolerates 30+ min activity without fatigue Endurance Deficit: No OT Assessment Rehab Potential (ACUTE ONLY): Excellent OT Patient demonstrates impairments in the following area(s): Balance;Endurance;Motor;Pain;Perception;Safety OT Basic ADL's Functional Problem(s): Grooming;Dressing;Toileting OT Advanced ADL's Functional Problem(s): Simple Meal Preparation;Light Housekeeping OT Transfers Functional Problem(s): Toilet;Tub/Shower OT Additional Impairment(s): Fuctional Use of Upper Extremity OT Plan OT Intensity: Minimum of 1-2 x/day, 45 to 90 minutes OT Frequency: 5 out of 7 days OT Duration/Estimated Length of Stay: 5-7 days OT Treatment/Interventions: Balance/vestibular training;Community reintegration;Discharge planning;Disease mangement/prevention;DME/adaptive equipment instruction;Functional mobility training;Neuromuscular re-education;Pain management;Patient/family education;Psychosocial support;Self Care/advanced ADL  retraining;Therapeutic Activities;Therapeutic Exercise;UE/LE Strength taining/ROM;UE/LE Coordination activities OT Basic Self-Care Anticipated Outcome(s): Mod I OT Toileting Anticipated Outcome(s): Mod I OT Bathroom Transfers Anticipated Outcome(s): Mod I OT Recommendation Patient destination: Home Follow Up Recommendations: Outpatient OT(vs None) Equipment Recommended: None recommended by OT(pt has access to all equipment)   Skilled Therapeutic Intervention OT eval completed with discussion of rehab process, OT purpose, POC, ELOS, and goals.  ADL assessment completed with functional transfers without AD with min assist and bathing and dressing at sit > stand level with CGA at sink. Pt reports bathing at sink d/t RLE wound/unna boot.  Pt demonstrating decreased gross grasp and pinch strength with inability to open body wash and don personal compression stocking.  Pt returned to bed at end of session and left semi-reclined with all needs in reach.  OT Evaluation Precautions/Restrictions  Precautions Precaution Comments: R side weakness Pain Pain Assessment Pain Scale: 0-10 Pain Score: 0-No pain Home Living/Prior Functioning Home Living Family/patient expects to be discharged to:: Private residence Living Arrangements: Spouse/significant other Available Help at Discharge: Family, Available 24 hours/day Type of Home: House Home Access: Stairs to enter Technical brewer of Steps: 5 Entrance Stairs-Rails: Left Home Layout: Two level, Able to live on main level with bedroom/bathroom Bathroom Shower/Tub: Walk-in shower(has shower seat, grab bars, and hand held shower head) Bathroom Toilet: Handicapped height  Lives With: Spouse IADL History Homemaking Responsibilities: Yes Meal Prep Responsibility: Primary Laundry Responsibility: Primary Cleaning Responsibility: Primary Current License: Yes Prior Function Level of Independence: Independent with basic ADLs, Independent with  homemaking with ambulation, Independent with gait  Able to Take Stairs?: Yes Driving: Yes Vocation: Retired Comments: reports independent ADLs, IADLs, driving  Vision Baseline Vision/History: Wears glasses Wears Glasses: At all times Patient Visual Report: No change from baseline Vision Assessment?: No apparent visual deficits Cognition Overall Cognitive Status: Within Functional Limits for tasks assessed Arousal/Alertness: Awake/alert Orientation Level: Person;Place;Situation Person: Oriented Place: Oriented Situation: Oriented Year: 2020  Month: March Day of Week: Correct Memory: Appears intact Immediate Memory Recall: Sock;Blue;Bed Memory Recall: Sock;Blue;Bed Memory Recall Sock: Without Cue Memory Recall Blue: Without Cue Memory Recall Bed: Without Cue Attention: Selective Selective Attention: Appears intact Awareness: Appears intact Problem Solving: Appears intact Safety/Judgment: Appears intact Sensation Sensation Light Touch: Appears Intact Proprioception: Appears Intact Coordination Gross Motor Movements are Fluid and Coordinated: No Fine Motor Movements are Fluid and Coordinated: No Coordination and Movement Description: mild fine motor coordination impairments on Rt Finger Nose Finger Test: [facility] Mobility  Transfers Sit to Stand: Contact Guard/Touching assist  Extremity/Trunk Assessment RUE Assessment RUE Assessment: Exceptions to [facility] Passive Range of Motion (PROM) Comments: WFL Active Range of Motion (AROM) Comments: shoulder flexion grossly 150*, distal WFL General Strength Comments: strength grossly 4/5 RUE Body System: Neuro Brunstrum levels for arm and hand: Arm;Hand Brunstrum level for arm: Stage V Relative Independence from Synergy Brunstrum level for hand: Stage VI Isolated joint movements LUE Assessment LUE Assessment: Within Functional Limits General Strength Comments: grossly 4+/5     Refer to Care Plan for Long Term  Goals  Recommendations for other services: None    Discharge Criteria: Patient will be discharged from OT if patient refuses treatment 3 consecutive times without medical reason, if treatment goals not met, if there is a change in medical status, if patient makes no progress towards goals or if patient is discharged from hospital.  The above assessment, treatment plan, treatment alternatives and goals were discussed and mutually agreed upon: by patient  Simonne Come 11/06/2018, 9:20 AM

## 2018-11-06 NOTE — Consult Note (Signed)
[place] Nurse wound consult note Reason for Consult:full thickness wound right lateral leg Wound type:venous and arterial insufficiency  Pressure Injury POA: NA Measurement:3cm x 2cm x unknown depth Wound bed:100% yellow Drainage (amount, consistency, odor) scant Periwound:intact Dressing procedure/placement/frequency:Orders placed for Rolena Infante as MD requested.Orders placed for Ortho Tech to apply. Wound cleansed and foam applied. We will not follow, but will remain available to this patient, to nursing, and the medical and/or surgical teams.   Please re-consult if we need to assist further.  Fara Olden, RN-C, WTA-C, [place] Wound Treatment Associate Ostomy Care Associate

## 2018-11-06 NOTE — Progress Notes (Signed)
Occupational Therapy Session Note  Patient Details  Name: [name] MRN: [id number] Date of Birth: Nov 25, 1950  Today's Date: 11/06/2018 OT Individual Time: 1130-1245 OT Individual Time Calculation (min): 75 min    Short Term Goals: Week 1:  OT Short Term Goal 1 (Week 1): STG = LTGs due to ELOS  Skilled Therapeutic Interventions/Progress Updates:   11:30 therapy session.  Patient seated in recliner with bilateral LEs elevated upon arrival.  She is pleasant, cooperative and ready for therapy session. Toileting/CM with CGA/CS.  Donn/doff shoes with min A.  Ambulation on unit with RW CG/CS to and from therapy gym space x2.   Grip (Jamar #2)  R = 25#, L = 25# Box and blocks:  R = 26, L = 49 9 hole peg:  R = 47 sec, L = 26 sec  dynavision activites in stance:  Trial 1:  2 min Left hand only = 1.35 sec                                                    Trial 2:  2 min Right hand only = 1.82 sec                                                     Trial 3:  4 min  Right hand red, Left hand green = right 1.83 sec, left 1.55 sec  Patient returned to room, S to get into bed and elevate LEs, bed alarm set and call bell in reach.   Balance/vestibular training;Community reintegration;Discharge planning;Disease mangement/prevention;DME/adaptive equipment instruction;Functional mobility training;Neuromuscular re-education;Pain management;Patient/family education;Psychosocial support;Self Care/advanced ADL retraining;Therapeutic Activities;Therapeutic Exercise;UE/LE Strength taining/ROM;UE/LE Coordination activities   Therapy Documentation Precautions:  Precautions Precaution Comments: R side weakness Restrictions Weight Bearing Restrictions: No General:   Vital Signs: Therapy Vitals Temp: 98.5 F (36.9 C) Pulse Rate: 83 Resp: 18 BP: 130/67 Patient Position (if appropriate): Sitting Oxygen Therapy SpO2: 95 % O2 Device: Room Air Pain: Pain Assessment Pain Scale: 0-10 Pain  Score: 0-No pain Other Treatments:     Therapy/Group: Individual Therapy  Carlos Levering 11/06/2018, 3:55 PM

## 2018-11-06 NOTE — Progress Notes (Signed)
Orthopedic Tech Progress Note Patient Details:  Brigitte Soderberg May 14, 1951 [id number]  Ortho Devices Type of Ortho Device: Louretta Parma boot Ortho Device/Splint Location: LRE Ortho Device/Splint Interventions: Application, Ordered   Post Interventions Patient Tolerated: Well Instructions Provided: Care of device, Adjustment of device   Janit Pagan 11/06/2018, 4:32 PM

## 2018-11-06 NOTE — Progress Notes (Addendum)
[place] PHYSICAL MEDICINE & REHABILITATION PROGRESS NOTE   Subjective/Complaints:  No issues overnite   ROS:  No CP, SOB, N/V/D  Objective:   Vas US Carotid (at [place] Only)  Result Date: 11/04/2018 Carotid Arterial Duplex Study Indications:  CVA and Weakness. Risk Factors: Hypertension, hyperlipidemia. Performing Technologist: Sharion Dove RVS  Examination Guidelines: A complete evaluation includes B-mode imaging, spectral Doppler, color Doppler, and power Doppler as needed of all accessible portions of each vessel. Bilateral testing is considered an integral part of a complete examination. Limited examinations for reoccurring indications may be performed as noted.  Right Carotid Findings: +----------+--------+--------+--------+------------+--------+           PSV cm/sEDV cm/sStenosisDescribe    Comments +----------+--------+--------+--------+------------+--------+ CCA Prox  105     17              homogeneous          +----------+--------+--------+--------+------------+--------+ CCA Distal81      23              homogeneous          +----------+--------+--------+--------+------------+--------+ ICA Prox  86      27              heterogenous         +----------+--------+--------+--------+------------+--------+ ICA Distal69      23                                   +----------+--------+--------+--------+------------+--------+ ECA       81      17                                   +----------+--------+--------+--------+------------+--------+ +----------+--------+-------+--------+-------------------+           PSV cm/sEDV cmsDescribeArm Pressure (mmHG) +----------+--------+-------+--------+-------------------+ [id number]                                         +----------+--------+-------+--------+-------------------+ +---------+--------+--+--------+--+ VertebralPSV cm/s41EDV cm/s15 +---------+--------+--+--------+--+  Left Carotid  Findings: +----------+--------+--------+--------+------------+--------+           PSV cm/sEDV cm/sStenosisDescribe    Comments +----------+--------+--------+--------+------------+--------+ CCA Prox  87      22              homogeneous          +----------+--------+--------+--------+------------+--------+ CCA Distal70      22              homogeneous          +----------+--------+--------+--------+------------+--------+ ICA Prox  82      33              heterogenous         +----------+--------+--------+--------+------------+--------+ ICA Distal118     43                                   +----------+--------+--------+--------+------------+--------+ ECA       93      18                                   +----------+--------+--------+--------+------------+--------+ +----------+--------+--------+--------+-------------------+ SubclavianPSV cm/sEDV cm/sDescribeArm Pressure (mmHG) +----------+--------+--------+--------+-------------------+  184                                         +----------+--------+--------+--------+-------------------+ +---------+--------+--+--------+--+ VertebralPSV cm/s77EDV cm/s19 +---------+--------+--+--------+--+  Summary: Right Carotid: Velocities in the right ICA are consistent with a 1-39% stenosis. Left Carotid: Velocities in the left ICA are consistent with a 1-39% stenosis. Vertebrals:  Bilateral vertebral arteries demonstrate antegrade flow. Subclavians: Normal flow hemodynamics were seen in bilateral subclavian              arteries. *See table(s) above for measurements and observations.  Electronically signed by Antony Contras MD on 11/04/2018 at 1:29:47 PM.    Final    Vas [place] Lower Extremity Venous (dvt)  Result Date: 11/04/2018  Lower Venous Study Indications: Stroke.  Limitations: Bandages and body habitus. Comparison Study: No prior study on file Performing Technologist: Sharion Dove RVS  Examination  Guidelines: A complete evaluation includes B-mode imaging, spectral Doppler, color Doppler, and power Doppler as needed of all accessible portions of each vessel. Bilateral testing is considered an integral part of a complete examination. Limited examinations for reoccurring indications may be performed as noted.  Right Venous Findings: +---------+---------------+---------+-----------+----------+------------------+          CompressibilityPhasicitySpontaneityPropertiesSummary            +---------+---------------+---------+-----------+----------+------------------+ CFV      Full                                                            +---------+---------------+---------+-----------+----------+------------------+ SFJ      Full                                                            +---------+---------------+---------+-----------+----------+------------------+ FV Prox  Full                                                            +---------+---------------+---------+-----------+----------+------------------+ FV Mid   Full                                                            +---------+---------------+---------+-----------+----------+------------------+ FV DistalFull                                                            +---------+---------------+---------+-----------+----------+------------------+ PFV      Full                                                            +---------+---------------+---------+-----------+----------+------------------+  POP      Full                                                            +---------+---------------+---------+-----------+----------+------------------+ PTV      Full                                         proximal portion                                                         only               +---------+---------------+---------+-----------+----------+------------------+ PERO                                                   Not visualized     +---------+---------------+---------+-----------+----------+------------------+ GSV      Full                                                            +---------+---------------+---------+-----------+----------+------------------+ Patient with Ardelia Mems boot  Right Technical Findings: Not visualized segments include peroneal vein and segments of the posterior tibial.  Left Venous Findings: +---------+---------------+---------+-----------+----------+-------+          CompressibilityPhasicitySpontaneityPropertiesSummary +---------+---------------+---------+-----------+----------+-------+ CFV      Full           Yes      Yes                          +---------+---------------+---------+-----------+----------+-------+ SFJ      Full                                                 +---------+---------------+---------+-----------+----------+-------+ FV Prox  Full                                                 +---------+---------------+---------+-----------+----------+-------+ FV Mid   Full                                                 +---------+---------------+---------+-----------+----------+-------+ FV DistalFull                                                 +---------+---------------+---------+-----------+----------+-------+  PFV      Full                                                 +---------+---------------+---------+-----------+----------+-------+ POP      Full           Yes      Yes                          +---------+---------------+---------+-----------+----------+-------+ PTV      Full                                                 +---------+---------------+---------+-----------+----------+-------+  Left Technical Findings: Not visualized segments include peroneal vein.   Summary: Right: There is no evidence of deep vein thrombosis in the lower extremity. However,  portions of this examination were limited- see technologist comments above. Left: There is no evidence of deep vein thrombosis in the lower extremity. However, portions of this examination were limited- see technologist comments above.  *See table(s) above for measurements and observations. Electronically signed by Servando Snare MD on 11/04/2018 at 2:18:42 PM.    Final    Recent Labs    11/04/18 0628 11/06/18 0445  WBC 6.4 4.7  HGB 9.2* 9.6*  HCT 29.0* 30.9*  PLT 515* 538*   Recent Labs    11/05/18 0433 11/06/18 0445  NA 142 141  K 3.9 3.6  CL 107 106  CO2 28 29  GLUCOSE 91 94  BUN 13 14  CREATININE 0.68 0.68  CALCIUM 9.4 9.5    Intake/Output Summary (Last 24 hours) at 11/06/2018 0858 Last data filed at 11/06/2018 0810 Gross per 24 hour  Intake 360 ml  Output -  Net 360 ml     Physical Exam: Vital Signs Blood pressure (!) 146/79, pulse 71, temperature 98.1 F (36.7 C), temperature source Oral, resp. rate 19, height 5\' 3"  (1.6 m), weight 78.2 kg, last menstrual period 12/20/2002, SpO2 96 %.   General: No acute distress Mood and affect are appropriate Heart: Regular rate and rhythm no rubs murmurs or extra sounds Lungs: Clear to auscultation, breathing unlabored, no rales or wheezes Abdomen: Positive bowel sounds, soft nontender to palpation, nondistended Extremities: No clubbing, cyanosis, or edema Skin: No evidence of breakdown, no evidence of rash Neurologic: Cranial nerves II through XII intact, motor strength is 5/5 in Left and 4/5 RIght  deltoid, bicep, tricep, grip, hip flexor, knee extensors, ankle dorsiflexor and plantar flexor Sensory exam normal sensation to light touchin bilateral upper and lower extremities Negative dysdiadochokinesis Musculoskeletal: Full range of motion in all 4 extremities. No joint swelling   Assessment/Plan: 1. Functional deficits secondary to Left corona radiata infarct which require 3+ hours per day of interdisciplinary therapy in a  comprehensive inpatient rehab setting.  Physiatrist is providing close team supervision and 24 hour management of active medical problems listed below.  Physiatrist and rehab team continue to assess barriers to discharge/monitor patient progress toward functional and medical goals  Care Tool:  Bathing    Body parts bathed by patient: Front perineal area, Buttocks         Bathing assist Assist Level: Set up assist  Upper Body Dressing/Undressing Upper body dressing        Upper body assist      Lower Body Dressing/Undressing Lower body dressing      What is the patient wearing?: Hospital gown only     Lower body assist       Toileting Toileting    Toileting assist Assist for toileting: Contact Guard/Touching assist     Transfers Chair/bed transfer  Transfers assist     Chair/bed transfer assist level: Minimal Assistance - Patient > 75%     Locomotion Ambulation   Ambulation assist      Assist level: Minimal Assistance - Patient > 75% Assistive device: Walker-rolling     Walk 10 feet activity   Assist           Walk 50 feet activity   Assist           Walk 150 feet activity   Assist           Walk 10 feet on uneven surface  activity   Assist           Wheelchair     Assist               Wheelchair 50 feet with 2 turns activity    Assist            Wheelchair 150 feet activity     Assist           Medical Problem List and Plan: 1.Right side weakness, facial droop and slurred speechsecondary to 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 Antithrombotics: -DVT/anticoagulation:Eliquis. Venous Dopplers negative -antiplatelet therapy: Aspirin 81 mg daily 3. Pain Management:Neurontin 600 mg twice a day,Cymbalta 60 mg daily at bedtime, oxycodone as  needed 4. Mood:Ativan 0.5 mg daily as needed -antipsychotic agents: see above 5. Neuropsych: This patientiscapable of making decisions on herown behalf. 6. Skin/Wound Care/chronic right lower extremity ulcer8 weeks with weekly changes of UNNA boots followed at the [facility] wound center. Routine skin checks. Consult WOC for [employer] change 7. Fluids/Electrolytes/Nutrition:Routine in and out's with follow-up chemistries 8. Hypertension. Lisinopril 10 mg daily Vitals:   11/05/18 1953 11/06/18 0415  BP: 126/78 (!) 146/79  Pulse: 87 71  Resp: 18 19  Temp: 98.6 F (37 C) 98.1 F (36.7 C)  SpO2: 95% 96%  9. Wegener's granulomatosis. Continue home regimen of Imuran 100 mg daily, prednisone 5 mg daily 10. Hyperlipidemia. Lipitor    LOS: 1 days A FACE TO FACE EVALUATION WAS PERFORMED  Charlett Blake 11/06/2018, 8:58 AM

## 2018-11-06 NOTE — Patient Care Conference (Signed)
Inpatient RehabilitationTeam Conference and Plan of Care Update Date: 11/06/2018   Time: 10:55 AM    Patient Name: [name]      Medical Record Number: [id number]  Date of Birth: 1951/04/18 Sex: Female         Room/Bed: 4W05C/4W05C-01 Payor Info: Payor: MEDICARE / Plan: MEDICARE PART A AND B / Product Type: *No Product type* /    Admitting Diagnosis: cva  Admit Date/Time:  11/05/2018  2:48 PM Admission Comments: No comment available   Primary Diagnosis:  <principal problem not specified> Principal Problem: <principal problem not specified>  Patient Active Problem List   Diagnosis Date Noted  . Lacunar infarct, acute ([place]) 11/05/2018  . Acute CVA (cerebrovascular accident) ([place]) 11/03/2018  . Ulcer of lower limb ([place]) 01/02/2014  . Chronic venous insufficiency 01/02/2014  . 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 Varicose veins of both lower extremities with complications 24/23/5361  . 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 Lung mass 12/06/2010  . HEADACHE 08/24/2010  . RASH-NONVESICULAR 08/23/2010  . CELLULITIS 08/09/2010  . ANEMIA-IRON DEFICIENCY 02/16/2010  . CARDIAC MURMUR, AORTIC 01/10/2010  . POSTMENOPAUSAL SYNDROME 03/09/2009  . Hyperlipemia 12/07/2008  . PERNICIOUS ANEMIA 12/07/2008  . DEGENERATIVE DISC DISEASE, LUMBOSACRAL SPINE W/RADICULOPATHY 12/07/2008  . FASTING HYPERGLYCEMIA 12/07/2008  . IRON DEFICIENCY ANEMIA SECONDARY TO BLOOD LOSS 02/14/2008  . Essential hypertension 01/01/2008  . ALLERGIC RHINITIS 12/03/2007  . GERD 12/03/2007  . PAIN IN JOINT, SITE UNSPECIFIED 12/03/2007  . SLEEP APNEA 12/03/2007  . HIATAL HERNIA 02/23/2006  . Diverticulosis of colon (without mention of hemorrhage) 12/30/2002    Expected Discharge Date: Expected Discharge Date: 11/09/18  Team Members Present: Physician leading conference: Dr. Alysia Penna Social Worker Present: Ovidio Kin, LCSW Nurse Present: Leonette Nutting, RN PT Present: Barrie Folk, PT;Rosita Dechalus, PTA OT Present: Darleen Crocker, OT SLP Present: Stormy Fabian, SLP PPS Coordinator present : Gunnar Fusi     Current Status/Progress Goal Weekly Team Focus  Medical   Pressure control is good, has resumed home dose of Eliquis without signs of bleeding.  Optimize blood pressure and medication regimen for home discharge  Initiate therapy program   Bowel/Bladder   continent of bowel & bladder reported, but incontinent episodes last night, LBM 3/17  remain continent  monitor for need for timed toileting   Swallow/Nutrition/ Hydration             ADL's   CGA transfers and bathing/dressing  Mod I  ADL retraining, dynamic standing balance, RUE NMR   Mobility   CGA-min assist without AD for gait, transfers, and stairs. Supervision assist bed mobility.   Supervision assist - Mod I for all mobility at ambulatory level   improved safety with gait, transfers, stair management. balance and coordination of the RLE    Communication             Safety/Cognition/ Behavioral Observations            Pain   c/o headache, has tylenol, percocet prn, gabapentin scheduled  pain scale <4/10  assess & treat as needed   Skin   no skin issues  no skin break down while on rehab  assess q shift      *See Care Plan and progress notes for long and short-term goals.     Barriers to Discharge  Current Status/Progress Possible Resolutions Date Resolved   Physician    Medical stability  Making quick progress  Anticipate short length of stay      Nursing                  PT  Decreased caregiver support;Inaccessible home environment                 OT                  SLP                SW                Discharge Planning/Teaching Needs:    Home with husband who can provide assist, pt is high level and will be short length of stay     Team Discussion:  Goals  mod/i level-still being evaluated due to new patient. Currently supervision-min assist level. No speech issues. MD addressing and adjusting medications.  Revisions to Treatment Plan:  DC 3/21    Continued Need for Acute Rehabilitation Level of Care: The patient requires daily medical management by a physician with specialized training in physical medicine and rehabilitation for the following conditions: Daily direction of a multidisciplinary physical rehabilitation program to ensure safe treatment while eliciting the highest outcome that is of practical value to the patient.: Yes Daily medical management of patient stability for increased activity during participation in an intensive rehabilitation regime.: Yes Daily analysis of laboratory values and/or radiology reports with any subsequent need for medication adjustment of medical intervention for : Neurological problems;Blood pressure problems   I attest that I was present, lead the team conference, and concur with the assessment and plan of the team.   [name] 11/07/2018, 10:40 AM

## 2018-11-06 NOTE — Evaluation (Signed)
Physical Therapy Assessment and Plan  Patient Details  Name: [name] MRN: [id number] Date of Birth: 05-18-1951  PT Diagnosis: Abnormal posture, Abnormality of gait and Hemiplegia non-dominant Rehab Potential: Excellent ELOS: 3-5 days    Today's Date: 11/06/2018 PT Individual Time: 1335-1430 PT Individual Time Calculation (min): 55 min    Problem List:  Patient Active Problem List   Diagnosis Date Noted  . Lacunar infarct, acute ([place]) 11/05/2018  . Acute CVA (cerebrovascular accident) ([place]) 11/03/2018  . Ulcer of lower limb ([place]) 01/02/2014  . Chronic venous insufficiency 01/02/2014  . 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 Varicose veins of both lower extremities with complications 35/32/9924  . Wegener's granulomatosis with vasculitis ([place]) 07/21/2013  . Alveolar pneumopathy ([place]) 02/11/2011  . Lung mass 12/06/2010  . HEADACHE 08/24/2010  . RASH-NONVESICULAR 08/23/2010  . CELLULITIS 08/09/2010  . ANEMIA-IRON DEFICIENCY 02/16/2010  . CARDIAC MURMUR, AORTIC 01/10/2010  . POSTMENOPAUSAL SYNDROME 03/09/2009  . Hyperlipemia 12/07/2008  . PERNICIOUS ANEMIA 12/07/2008  . DEGENERATIVE DISC DISEASE, LUMBOSACRAL SPINE W/RADICULOPATHY 12/07/2008  . FASTING HYPERGLYCEMIA 12/07/2008  . IRON DEFICIENCY ANEMIA SECONDARY TO BLOOD LOSS 02/14/2008  . Essential hypertension 01/01/2008  . ALLERGIC RHINITIS 12/03/2007  . GERD 12/03/2007  . PAIN IN JOINT, SITE UNSPECIFIED 12/03/2007  . SLEEP APNEA 12/03/2007  . HIATAL HERNIA 02/23/2006  . Diverticulosis of colon (without mention of hemorrhage) 12/30/2002    Past Medical History:  Past Medical History:  Diagnosis Date  . Allergic rhinitis   . Anemia    recurrent iron defic.  Marland Kitchen Cervical polyp 03/2003  . DDD (degenerative disc disease)   . Depression    Dr. Toy Care (situational, divorce, loss of parents)  . Diverticulosis   . DVT (deep venous thrombosis) ([facility])    both legs   . Esophageal stricture   . GERD (gastroesophageal reflux disease)   . Hiatal hernia   . Hyperlipidemia   . Hypertension   . Hypertension   . Neuropathy, peripheral   . OSA (obstructive sleep apnea)   . Osteoarthritis   . Pulmonary embolism ([place])    left lung  . Wegner's disease (congenital syphilitic osteochondritis) 2012   DR. Ginger Organ at [facility]   Past Surgical History:  Past Surgical History:  Procedure Laterality Date  . back injection     steroid injection x2  . BRONCHOSCOPY  april 2012  . iron infusion  11/14   seeing hematologist at [facility]  . lung biospy  may 2012  . PARTIAL HIP ARTHROPLASTY Left 03/2014    Assessment & Plan Clinical Impression: Patient is a 68 year old right-handed female with history of hypertension maintained on HCTZ 12.5 mg daily, hyperlipidemia, GERD,chronic right lower extremity wound 8 weeks followed at the [facility] wound center with weekly changes of UNNA boot,Wegener's granulomatosis maintained on Imuran as well as chronic prednisone 5 mg daily followed by [facility]ology at [facility], DVT, obstructive sleep apnea, history of pulmonary emboli on Eliquis.Received inpatient rehabilitation services 2012 debility related to Wegener granulomatosis..Per chart review patient lives with spouse. Independent prior to admission and driving. Two-level home with bedroom on main level and 5 steps to entry. . Presented 11/03/2018 with right side weakness, facial droop and slurred speech of acute onset. Cranial CT scan suspicious left MCA density in the sylvian fissure. 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 No evidence of associated hemorrhage. MRA  negative for large vessel occlusion.  Patient transferred to CIR on 11/05/2018 .   Patient currently requires min with mobility secondary to muscle weakness, decreased coordination and decreased  standing balance and decreased balance strategies.  Prior to hospitalization, patient was modified independent  with mobility and lived with Spouse in a House home.  Home access is 5Stairs to enter.  Patient will benefit from skilled PT intervention to maximize safe functional mobility, minimize fall risk and decrease caregiver burden for planned discharge home with intermittent assist.  Anticipate patient will benefit from follow up [facility] at discharge.  PT - End of Session Activity Tolerance: Tolerates 10 - 20 min activity with multiple rests Endurance Deficit: Yes PT Assessment Rehab Potential (ACUTE/IP ONLY): Excellent PT Barriers to Discharge: Decreased caregiver support;Inaccessible home environment PT Patient demonstrates impairments in the following area(s): Balance;Edema;Endurance;Motor;Safety PT Transfers Functional Problem(s): Bed Mobility;Bed to Chair;Car;Furniture;Floor PT Locomotion Functional Problem(s): Ambulation;Wheelchair Mobility;Stairs PT Plan PT Intensity: Minimum of 1-2 x/day ,45 to 90 minutes PT Frequency: 5 out of 7 days PT Duration Estimated Length of Stay: 3-5 days  PT Treatment/Interventions: Ambulation/gait training;Discharge planning;DME/adaptive equipment instruction;Functional mobility training;Pain management;Psychosocial support;Therapeutic Activities;UE/LE Strength taining/ROM;Visual/perceptual remediation/compensation;Wheelchair propulsion/positioning;UE/LE Coordination activities;Therapeutic Exercise;Stair training;Skin care/wound management;Patient/family education;Balance/vestibular training;Disease management/prevention;Community reintegration;Neuromuscular re-education PT Transfers Anticipated Outcome(s): Mod I with LRAD  PT Locomotion Anticipated Outcome(s): Supervision assist with LRAD at ambulatory level  PT Recommendation Recommendations for Other Services: Therapeutic Recreation consult Follow Up Recommendations: Home health PT Patient destination:  Home Equipment Recommended: To be determined Equipment Details: pt has Rollator and RW   Skilled Therapeutic Intervention Pt received sitting EOB and agreeable to PT. PT instructed patient in PT Evaluation and initiated treatment intervention; see below for results. PT educated patient in [place], rehab potential, rehab goals, and discharge recommendations. Pt required min assist without AD for transfers and ambulation as listed below. Supervision assist sit<>stand, stand pivot, and gait training with RW. Patient demonstrates increased fall risk as noted by score of   37/56 on Berg Balance Scale.  (<36= high risk for falls, close to 100%; 37-45 significant >80%; 46-51 moderate >50%; 52-55 lower >25%) Patient returned to room and left sitting in [facility] with call bell in reach and all needs met.        PT Evaluation Precautions/Restrictions   fall  General   Vital SignsTherapy Vitals Temp: 98.5 F (36.9 C) Pulse Rate: 83 Resp: 18 BP: 130/67 Patient Position (if appropriate): Sitting Oxygen Therapy SpO2: 95 % O2 Device: Room Air Pain   denies Home Living/Prior Functioning Home Living Available Help at Discharge: Family;Available 24 hours/day Type of Home: House Home Access: Stairs to enter [employer] of Steps: 5 Entrance Stairs-Rails: Left Home Layout: Two level;Able to live on main level with bedroom/bathroom Bathroom Shower/Tub: Walk-in shower(has shower seat, grab bars, and hand held shower head) Bathroom Toilet: Handicapped height  Lives With: Spouse Prior Function Level of Independence: Independent with basic ADLs;Independent with homemaking with ambulation;Independent with gait  Able to Take Stairs?: Yes Driving: Yes Vocation: Retired Comments: reports independent ADLs, IADLs, driving  Vision/Perception  Perception Perception: Within Functional Limits Praxis Praxis: Intact  Cognition Overall Cognitive Status: Within Functional Limits for tasks  assessed Arousal/Alertness: Awake/alert Attention: Selective Selective Attention: Appears intact Memory: Appears intact Awareness: Appears intact Problem Solving: Appears intact Safety/Judgment: Appears intact Sensation Sensation Light Touch: Appears Intact Proprioception: Appears Intact Coordination Gross Motor Movements are Fluid and Coordinated: No Fine Motor Movements are Fluid and Coordinated: No Coordination and Movement Description: mild motor coordination  impairments on Rt Finger Nose Finger Test: [facility] Heel Shin Test: decreased speed and accurancy on the R Motor  Motor Motor: Hemiplegia Motor - Skilled Clinical Observations: mild R hemiplegia and dysmetria LE>UE  Mobility Bed Mobility Bed Mobility: Rolling Right;Rolling Left;Supine to Sit;Sit to Supine Rolling Right: Supervision/verbal cueing Rolling Left: Supervision/Verbal cueing Supine to Sit: Supervision/Verbal cueing Sit to Supine: Supervision/Verbal cueing Transfers Transfers: Sit to Stand Sit to Stand: Minimal Assistance - Patient > 75%(posterior LOB ) Transfer (Assistive device): None Locomotion  Gait Ambulation: Yes Gait Assistance: Minimal Assistance - Patient > 75% Gait Distance (Feet): 150 Feet Assistive device: None Gait Gait: Yes Gait Pattern: Impaired Gait Pattern: Right flexed knee in stance;Wide base of support Stairs / Additional Locomotion Stairs: Yes Stairs Assistance: Minimal Assistance - Patient > 75% Stair Management Technique: One rail Left Number of Stairs: 12 Height of Stairs: 6 Wheelchair Mobility Wheelchair Mobility: No  Trunk/Postural Assessment  Cervical Assessment Cervical Assessment: Exceptions to [facility] Thoracic Assessment Thoracic Assessment: Exceptions to WFL(rounded shoulders ) Lumbar Assessment Lumbar Assessment: Exceptions to WFL(posterior rotated pelvis) Postural Control Postural Control: Deficits on evaluation(posterior LOB )  Balance Standardized Balance  Assessment Standardized Balance Assessment: Berg Balance Test Berg Balance Test Sit to Stand: Able to stand  independently using hands Standing Unsupported: Able to stand safely 2 minutes Sitting with Back Unsupported but Feet Supported on Floor or Stool: Able to sit safely and securely 2 minutes Stand to Sit: Controls descent by using hands Transfers: Able to transfer safely, definite need of hands Standing Unsupported with Eyes Closed: Able to stand 10 seconds safely Standing Ubsupported with Feet Together: Able to place feet together independently and stand for 1 minute with supervision From Standing, Reach Forward with Outstretched Arm: Can reach forward >12 cm safely (5") From Standing Position, Pick up Object from Floor: Able to pick up shoe, needs supervision From Standing Position, Turn to Look Behind Over each Shoulder: Looks behind one side only/other side shows less weight shift Turn 360 Degrees: Needs close supervision or verbal cueing Standing Unsupported, Alternately Place Feet on Step/Stool: Needs assistance to keep from falling or unable to try Standing Unsupported, One Foot in Front: Able to take small step independently and hold 30 seconds Standing on One Leg: Tries to lift leg/unable to hold 3 seconds but remains standing independently Total Score: 37 Dynamic Sitting Balance Dynamic Sitting - Level of Assistance: 6: Modified independent (Device/Increase time) Static Standing Balance Static Standing - Level of Assistance: 5: Stand by assistance Dynamic Standing Balance Dynamic Standing - Level of Assistance: 4: Min assist Extremity Assessment      RLE Assessment RLE Assessment: Within Functional Limits General Strength Comments: mild coordinaiton deficits LLE Assessment LLE Assessment: Within Functional Limits    Refer to Care Plan for Long Term Goals  Recommendations for other services: Therapeutic Recreation  Outing/community reintegration  Discharge  Criteria: Patient will be discharged from PT if patient refuses treatment 3 consecutive times without medical reason, if treatment goals not met, if there is a change in medical status, if patient makes no progress towards goals or if patient is discharged from hospital.  The above assessment, treatment plan, treatment alternatives and goals were discussed and mutually agreed upon: by patient  Lorie Phenix 11/06/2018, 3:32 PM

## 2018-11-06 NOTE — Progress Notes (Signed)
Per nursing, patient was given "Data Collection Information Summary for Patients in Inpatient Rehabilitation Facilities with attached Privacy Act Statement Health Care Records" upon admission.    Patient information reviewed and entered into eRehab System by Becky Jordynn Marcella, PPS coordinator. Information including medical coding, function ability, and quality indicators will be reviewed and updated through discharge.   

## 2018-11-07 ENCOUNTER — Inpatient Hospital Stay (HOSPITAL_COMMUNITY): Payer: Medicare Other | Admitting: Physical Therapy

## 2018-11-07 ENCOUNTER — Inpatient Hospital Stay (HOSPITAL_COMMUNITY): Payer: Medicare Other | Admitting: Occupational Therapy

## 2018-11-07 NOTE — Progress Notes (Signed)
Physical Therapy Session Note  Patient Details  Name: Madisson Kulaga MRN: [id number] Date of Birth: [birth year]  Today's Date: 11/07/2018 PT Individual Time: 1530-1605 PT Individual Time Calculation (min): 35 min   Short Term Goals: Week 1:  PT Short Term Goal 1 (Week 1): STG=LTG due to ELOS  Skilled Therapeutic Interventions/Progress Updates:   Pt received sitting in recliner and agreeable to PT. Sit<>stand without cues or assist from PT with RW. Gait training with RW, ROllator, and without AD 4x 134f . Supervision assist overall from PT with and without AD. Improved step length, weight shifting, and posture with RW compared to no AD. Nustep reciprocal movement training x 6 min with supervision assist and min cues for full ROM and proper speed. Pt performed toilet transfers with supervision assist and RW. Patient returned to room and left sitting in recliner with call bell in reach and all needs met.        Therapy Documentation Precautions:  Precautions Precaution Comments: R side weakness Restrictions Weight Bearing Restrictions: No    Vital Signs: Therapy Vitals Temp: 98.5 F (36.9 C) Temp Source: Oral Pulse Rate: 93 Resp: 18 BP: 113/64 Patient Position (if appropriate): Sitting Oxygen Therapy SpO2: 96 % O2 Device: Room Air Pain: Pain Assessment Pain Scale: 0-10 Pain Score: 0-No pain Pain Type: Acute pain Pain Location: Head Pain Descriptors / Indicators: Headache Pain Frequency: Intermittent Pain Onset: On-going Patients Stated Pain Goal: 2 Pain Intervention(s): Medication (See eMAR)    Therapy/Group: Individual Therapy  ALorie Phenix3/19/2020, 4:06 PM

## 2018-11-07 NOTE — Progress Notes (Signed)
[facility] PHYSICAL MEDICINE & REHABILITATION PROGRESS NOTE   Subjective/Complaints:  Pt walking in hall with PT, using walker for d/c but should be able to upgrade to cane after home health   ROS:  No CP, SOB, N/V/D  Objective:   No results found. Recent Labs    11/06/18 0445  WBC 4.7  HGB 9.6*  HCT 30.9*  PLT 538*   Recent Labs    11/05/18 0433 11/06/18 0445  NA 142 141  K 3.9 3.6  CL 107 106  CO2 28 29  GLUCOSE 91 94  BUN 13 14  CREATININE 0.68 0.68  CALCIUM 9.4 9.5    Intake/Output Summary (Last 24 hours) at 11/07/2018 0843 Last data filed at 11/07/2018 8250 Gross per 24 hour  Intake 360 ml  Output -  Net 360 ml     Physical Exam: Vital Signs Blood pressure (!) 142/69, pulse 71, temperature 97.9 F (36.6 C), temperature source Oral, resp. rate 15, height 5\' 3"  (1.6 m), weight 78.2 kg, last menstrual period 12/20/2002, SpO2 95 %.   General: No acute distress Mood and affect are appropriate Heart: Regular rate and rhythm no rubs murmurs or extra sounds Lungs: Clear to auscultation, breathing unlabored, no rales or wheezes Abdomen: Positive bowel sounds, soft nontender to palpation, nondistended Extremities: No clubbing, cyanosis, or edema Skin: No evidence of breakdown, no evidence of rash Neurologic: Cranial nerves II through XII intact, motor strength is 5/5 in Left and 4/5 RIght  deltoid, bicep, tricep, grip, hip flexor, knee extensors, ankle dorsiflexor and plantar flexor Sensory exam normal sensation to light touchin bilateral upper and lower extremities Negative dysdiadochokinesis Musculoskeletal: Full range of motion in all 4 extremities. No joint swelling   Assessment/Plan: 1. Functional deficits secondary to Left corona radiata infarct which require 3+ hours per day of interdisciplinary therapy in a comprehensive inpatient rehab setting.  Physiatrist is providing close team supervision and 24 hour management of active medical problems listed  below.  Physiatrist and rehab team continue to assess barriers to discharge/monitor patient progress toward functional and medical goals  Care Tool:  Bathing    Body parts bathed by patient: Front perineal area, Buttocks     Body parts n/a: Right lower leg(unna boot)   Bathing assist Assist Level: Supervision/Verbal cueing     Upper Body Dressing/Undressing Upper body dressing   What is the patient wearing?: Hospital gown only    Upper body assist Assist Level: Minimal Assistance - Patient > 75%    Lower Body Dressing/Undressing Lower body dressing      What is the patient wearing?: Underwear/pull up, Pants     Lower body assist Assist for lower body dressing: Contact Guard/Touching assist     Toileting Toileting    Toileting assist Assist for toileting: Supervision/Verbal cueing     Transfers Chair/bed transfer  Transfers assist     Chair/bed transfer assist level: Supervision/Verbal cueing     Locomotion Ambulation   Ambulation assist      Assist level: Minimal Assistance - Patient > 75% Assistive device: Walker-rolling Max distance: 150   Walk 10 feet activity   Assist     Assist level: Minimal Assistance - Patient > 75%     Walk 50 feet activity   Assist    Assist level: Minimal Assistance - Patient > 75%      Walk 150 feet activity   Assist    Assist level: Minimal Assistance - Patient > 75%  Walk 10 feet on uneven surface  activity   Assist     Assist level: Minimal Assistance - Patient > 75%     Wheelchair     Assist Will patient use wheelchair at discharge?: No   Wheelchair activity did not occur: N/A         Wheelchair 50 feet with 2 turns activity    Assist    Wheelchair 50 feet with 2 turns activity did not occur: N/A       Wheelchair 150 feet activity     Assist Wheelchair 150 feet activity did not occur: N/A         Medical Problem List and Plan: 1.Right side  weakness, facial droop and slurred speechsecondary to 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 Antithrombotics: -DVT/anticoagulation:Eliquis. Venous Dopplers negative -antiplatelet therapy: Aspirin 81 mg daily 3. Pain Management:Neurontin 600 mg twice a day,Cymbalta 60 mg daily at bedtime, oxycodone as needed 4. Mood:Ativan 0.5 mg daily as needed -antipsychotic agents: see above 5. Neuropsych: This patientiscapable of making decisions on herown behalf. 6. Skin/Wound Care/chronic right lower extremity ulcer8 weeks with weekly changes of UNNA boots followed at the [facility] wound center. Routine skin checks. Consult WOC for [employer] change- appreciate their note, ortho tech to change UNNA 7. Fluids/Electrolytes/Nutrition:Routine in and out's with follow-up chemistries 8. Hypertension. Lisinopril 10 mg daily- some lability but overall good control Vitals:   11/06/18 1946 11/07/18 0432  BP: 127/67 (!) 142/69  Pulse: 87 71  Resp: 16 15  Temp: 98.4 F (36.9 C) 97.9 F (36.6 C)  SpO2: 96% 95%  9. Wegener's granulomatosis. Continue home regimen of Imuran 100 mg daily, prednisone 5 mg daily 10. Hyperlipidemia. Lipitor    LOS: 2 days A FACE TO Brown E Kirsteins 11/07/2018, 8:43 AM

## 2018-11-07 NOTE — Progress Notes (Signed)
Social Work Patient ID: [name], female   DOB: February 23, 1951, 68 y.o.   MRN: [id number] Met with pt to inform her of team conference goals mod/i level and target discharge date 3/21. She feels  She will be ready then and wants [facility] to provide her home health therapies. Has all equipment, will work on discharge needs.

## 2018-11-07 NOTE — IPOC Note (Signed)
Overall Plan of Care [facility]) Patient Details Name: [name] MRN: [id number] DOB: [birth year]  Admitting Diagnosis: <principal problem not specified>  Hospital Problems: Active Problems:   Lacunar infarct, acute ([place])     Functional Problem List: Nursing Endurance, Medication Management, Skin Integrity, Motor, Nutrition, Pain  PT Balance, Edema, Endurance, Motor, Safety  OT Balance, Endurance, Motor, Pain, Perception, Safety  SLP    TR         Basic ADL's: OT Grooming, Dressing, Toileting     Advanced  ADL's: OT Simple Meal Preparation, Light Housekeeping     Transfers: PT Bed Mobility, Bed to Chair, Car, [employer], Futures trader, Metallurgist: PT Ambulation, Emergency planning/management officer, Stairs     Additional Impairments: OT Fuctional Use of Upper Extremity  SLP        TR      Anticipated Outcomes Item Anticipated Outcome  Self Feeding    Swallowing      Basic self-care  Mod I  Toileting  Mod I   Bathroom Transfers Mod I  Bowel/Bladder  Pt will manage bowel and bladder with min assist   Transfers  Mod I with LRAD   Locomotion  Supervision assist with LRAD at ambulatory level   Communication     Cognition     Pain  Pt will manage pain at 3 or less on a scale of 0-10.   Safety/Judgment  Pt will remain free of falls with injury while in rehab with min assist    Therapy Plan: PT Intensity: Minimum of 1-2 x/day ,45 to 90 minutes PT Frequency: 5 out of 7 days PT Duration Estimated Length of Stay: 3-5 days  OT Intensity: Minimum of 1-2 x/day, 45 to 90 minutes OT Frequency: 5 out of 7 days OT Duration/Estimated Length of Stay: 5-7 days      Team Interventions: Nursing Interventions Patient/Family Education, Bladder Management, Pain Management, Skin Care/Wound Management, Discharge Planning, Medication Management  PT interventions Ambulation/gait training, Discharge planning, DME/adaptive equipment instruction, Functional mobility  training, Pain management, Psychosocial support, Therapeutic Activities, UE/LE Strength taining/ROM, Visual/perceptual remediation/compensation, Wheelchair propulsion/positioning, UE/LE Coordination activities, Therapeutic Exercise, Stair training, Skin care/wound management, Patient/family education, Training and development officer, Disease management/prevention, Academic librarian, Neuromuscular re-education  OT Interventions Training and development officer, Academic librarian, Discharge planning, Disease mangement/prevention, Engineer, drilling, Functional mobility training, Neuromuscular re-education, Pain management, Patient/family education, Psychosocial support, Self Care/advanced ADL retraining, Therapeutic Activities, Therapeutic Exercise, UE/LE Strength taining/ROM, UE/LE Coordination activities  SLP Interventions    TR Interventions    SW/CM Interventions Discharge Planning, Psychosocial Support, Patient/Family Education   Barriers to Discharge MD  Medical stability  Nursing      PT Decreased caregiver support, Inaccessible home environment    OT      SLP      SW       Team Discharge Planning: Destination: PT-Home ,OT- Home , SLP-  Projected Follow-up: PT-Home health PT, OT-  Outpatient OT(vs None), SLP-  Projected Equipment Needs: PT-To be determined, OT- None recommended by OT(pt has access to all equipment), SLP-  Equipment Details: PT-pt has Rollator and RW , OT-  Patient/family involved in discharge planning: PT- Patient,  OT-Patient, SLP-   MD ELOS: 6-10d Medical Rehab Prognosis:  Excellent Assessment:  68 year old right-handed female with history of hypertension maintained on HCTZ 12.5 mg daily, hyperlipidemia, GERD,chronic right lower extremity wound 8 weeks followed at the [facility] wound center with weekly changes of UNNA boot,Wegener's granulomatosis maintained on Imuran as well as  chronic prednisone 5 mg daily followed by [facility]ology at [facility], DVT, obstructive sleep apnea, history of pulmonary emboli on Eliquis.Received inpatient rehabilitation services 2012 debility related to Wegener granulomatosis..Per chart review patient lives with spouse. Independent prior to admission and driving. Two-level home with bedroom on main level and 5 steps to entry. . Presented 11/03/2018 with right side weakness, facial droop and slurred speech of acute onset. Cranial CT scan suspicious left MCA density in the sylvian fissure. 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 No evidence of associated hemorrhage. MRA negative for large vessel occlusion. Echocardiogram with ejection fraction of 46% normal systolic function. Neurology follow-up patient currently remains on Eliquis as prior to admission as well as the addition of aspirin. Tolerating a regular diet. Bilateral lower extremity Dopplers negative for DVT. Carotid Dopplers with no ICA stenosis   Now requiring 24/7 Rehab RN,MD, as well as CIR level PT, OT and SLP.  Treatment team will focus on ADLs and mobility with goals set at Mod I See Team Conference Notes for weekly updates to the plan of care

## 2018-11-07 NOTE — Progress Notes (Signed)
Occupational Therapy Session Note  Patient Details  Name: [name] MRN: [id number] Date of Birth: [birth year]  Today's Date: 11/07/2018 OT Individual Time: 1020-1100 and 1345-1500 OT Individual Time Calculation (min): 40 min and 75 min   Short Term Goals: Week 1:  OT Short Term Goal 1 (Week 1): STG = LTGs due to ELOS  Skilled Therapeutic Interventions/Progress Updates:    1) Treatment session with focus on functional mobility in home environment.  Pt received upright seated at EOB reporting need to toilet.  Ambulated to bathroom with RW with supervision and completed toileting with supervision.  Ambulated to ADL apt with RW and supervision.  Engaged in furniture transfers with RW with supervision, completed simulated meal prep with pt able to obtain items from various heights and surfaces and able to complete dish washing without issue.  Educated on energy conservation strategies and modifications of tasks to increase success.  Issued walker bag and provided education on safe transfers of items to allow for safe hand placement on RW.  Returned to room and transferred back to bed.  Pt with no c/o pain  2) Treatment session with focus on RUE grasp and pinch strength.  Provided pt with red theraputty and handout to address grasp and pinch strength.  Provided education on each exercise with pt able to return demonstration of each.  Encouraged pt to complete handout x1/day.  Ambulated to therapy gym with RW with supervision.  Engaged in functional reaching and pinch strength with resistive clothespins.  Pt completed various pinch grips with resistive clothespins incorporating reaching outside BOS.  Pt reports improving strength and fine motor control each day.  Ambulated back to room with RW and left in recliner with all needs in reach.  Pt with no c/o pain  Therapy Documentation Precautions:  Precautions Precaution Comments: R side weakness Restrictions Weight Bearing Restrictions:  No General:   Vital Signs: Therapy Vitals Temp: 98.5 F (36.9 C) Temp Source: Oral Pulse Rate: 93 Resp: 18 BP: 113/64 Patient Position (if appropriate): Sitting Oxygen Therapy SpO2: 96 % O2 Device: Room Air Pain: Pain Assessment Pain Scale: 0-10 Pain Score: 4  Pain Type: Acute pain Pain Location: Head Pain Descriptors / Indicators: Headache Pain Frequency: Intermittent Pain Onset: On-going Patients Stated Pain Goal: 2 Pain Intervention(s): Medication (See eMAR)   Therapy/Group: Individual Therapy  Simonne Come 11/07/2018, 3:46 PM

## 2018-11-07 NOTE — Progress Notes (Signed)
Physical Therapy Session Note  Patient Details  Name: [name] MRN: [id number] Date of Birth: 06/12/1951  Today's Date: 11/07/2018 PT Individual Time: 0800-0855 PT Individual Time Calculation (min): 55 min   Short Term Goals: Week 1:  PT Short Term Goal 1 (Week 1): STG=LTG due to ELOS  Skilled Therapeutic Interventions/Progress Updates:    pt performs standing balance for handwashing and grooming at sink with supervision. Pt performs gait with RW throughout unit with supervision.  Gait training without device in controlled environment with min A, min A with obstacle negotiation with cues to attend to [place].  Standing balance tap ups with with mod/max A, static stance with Rt LE bias with Lt LE on 4'' step with min A for balance, cues to improve glute contraction on Rt.  Squat and reach task with Rt UE grasp and release activities with CGA.  Pt left in recliner with all needs at hand.   Therapy Documentation Precautions:  Precautions Precaution Comments: R side weakness Restrictions Weight Bearing Restrictions: No Pain:  pt c/o low back pain at end of session, RN made aware   Therapy/Group: Individual Therapy  DONAWERTH,KAREN 11/07/2018, 8:59 AM

## 2018-11-08 ENCOUNTER — Inpatient Hospital Stay (HOSPITAL_COMMUNITY): Payer: Medicare Other

## 2018-11-08 ENCOUNTER — Inpatient Hospital Stay (HOSPITAL_COMMUNITY): Payer: Medicare Other | Admitting: Physical Therapy

## 2018-11-08 ENCOUNTER — Inpatient Hospital Stay (HOSPITAL_COMMUNITY): Payer: Medicare Other | Admitting: Occupational Therapy

## 2018-11-08 MED ORDER — DULOXETINE HCL 60 MG PO CPEP: 60.0000 mg | ORAL_CAPSULE | Freq: Every day | ORAL | 3 refills | Status: AC

## 2018-11-08 MED ORDER — OXYCODONE-ACETAMINOPHEN 5-325 MG PO TABS: 1.0000 | ORAL_TABLET | Freq: Four times a day (QID) | ORAL | 0 refills | Status: AC | PRN

## 2018-11-08 MED ORDER — ATORVASTATIN CALCIUM 40 MG PO TABS: 40.0000 mg | ORAL_TABLET | Freq: Every day | ORAL | 0 refills | Status: AC

## 2018-11-08 MED ORDER — GABAPENTIN 600 MG PO TABS: 600.0000 mg | ORAL_TABLET | Freq: Two times a day (BID) | ORAL | 0 refills | Status: AC

## 2018-11-08 MED ORDER — LISINOPRIL 10 MG PO TABS: 10.0000 mg | ORAL_TABLET | Freq: Every day | ORAL | 0 refills | Status: AC

## 2018-11-08 MED ORDER — APIXABAN 5 MG PO TABS: 2.5000 mg | ORAL_TABLET | Freq: Two times a day (BID) | ORAL | 0 refills | Status: AC

## 2018-11-08 MED ORDER — LORAZEPAM 0.5 MG PO TABS: 0.5000 mg | ORAL_TABLET | Freq: Every day | ORAL | 0 refills | Status: AC | PRN

## 2018-11-08 NOTE — Progress Notes (Signed)
Occupational Therapy Session Note  Patient Details  Name: [name] MRN: [id number] Date of Birth: 13-Nov-1950  Today's Date: 11/08/2018 OT Individual Time: 0830-0930 OT Individual Time Calculation (min): 60 min    Short Term Goals: Week 1:  OT Short Term Goal 1 (Week 1): STG = LTGs due to ELOS  Skilled Therapeutic Interventions/Progress Updates:    Pt received sitting EOB ready for therapy.  Discussed the dc plan for tomorrow and today's session would be used to ensure she can complete her ADLs at a mod I level.  Pt agreed. Pt used her RW to move about the room independently.   She completed bathing at the sink (due to her leg wound not being able to get wet) using sit to stands from an arm chair independently.  Pt completed all self care at a mod I level. Pt is using her RUE at a functional level.    Pt made mod I in the room and informed pt that she can move about as she wishes but to call for help IF she is not feeling well.   Pt expressed a desire to go home today. Informed social work of her request.   Therapy Documentation Precautions:  Precautions Precaution Comments: R side weakness Restrictions Weight Bearing Restrictions: No    Vital Signs: Therapy Vitals Temp: 97.9 F (36.6 C) Temp Source: Oral Pulse Rate: 75 Resp: 14 BP: 128/74 Patient Position (if appropriate): Lying Oxygen Therapy SpO2: 93 % O2 Device: Room Air Pain: Pain Assessment Pain Scale: 0-10 Pain Score: 8  Pain Type: Acute pain Pain Location: Back Pain Orientation: Lower Pain Descriptors / Indicators: Aching Pain Frequency: Intermittent Pain Onset: On-going Patients Stated Pain Goal: 0 Pain Intervention(s): Medication (See eMAR);Repositioned Multiple Pain Sites: No        Therapy/Group: Individual Therapy  [place] 11/08/2018, 8:46 AM

## 2018-11-08 NOTE — Progress Notes (Signed)
Physical Therapy Discharge Summary  Patient Details  Name: [name] MRN: [id number] Date of Birth: [birth year]  Today's Date: 11/08/2018 PT Individual Time: 1000-1105 PT Individual Time Calculation (min): 65 min    Patient has met 7 of 7 long term goals due to improved activity tolerance, improved balance, improved postural control, increased range of motion, functional use of  right lower extremity and improved coordination.  Patient to discharge at an ambulatory level Modified Independent.   Patient's care partner is independent to provide the necessary physical assistance at discharge.  Reasons goals not met: All PT goals met   Recommendation:  Patient will benefit from ongoing skilled PT services in home health setting to continue to advance safe functional mobility, address ongoing impairments in balance, safety, coordination, gait, and minimize fall risk.  Equipment: No equipment provided  Reasons for discharge: treatment goals met and discharge from hospital  Patient/family agrees with progress made and goals achieved: Yes   PT Treatment:  Pt received sitting in recliner and agreeable to PT. PT instructed patient in PT Evaluation and initiated treatment intervention; see below for results. PT educated patient in [place], rehab potential, rehab goals, and discharge recommendations. PT instructed pt in Modified [place] level A balance exercises with hand out provided. Patient demonstrates increased fall risk as noted by score of   43/56 on Berg Balance Scale.  (<36= high risk for falls, close to 100%; 37-45 significant >80%; 46-51 moderate >50%; 52-55 lower >25%) Patient returned to room and left sitting in [facility] with call bell in reach and all needs met.        PT Discharge Precautions/Restrictions Restrictions Weight Bearing Restrictions: No Pain Pain Assessment Pain Scale: 0-10 Pain Score: 0-No pain Pain Type: Acute pain Pain Location: Back Pain Orientation:  Lower Pain Descriptors / Indicators: Aching Pain Frequency: Intermittent Pain Onset: On-going Patients Stated Pain Goal: 0 Pain Intervention(s): Medication (See eMAR);Repositioned Multiple Pain Sites: No Vision/Perception  Perception Perception: Within Functional Limits Praxis Praxis: Intact  Cognition Orientation Level: Oriented X4 Sustained Attention: Appears intact Selective Attention: Appears intact Memory: Appears intact Awareness: Appears intact Problem Solving: Appears intact Safety/Judgment: Appears intact Sensation Sensation Light Touch: Appears Intact Proprioception: Appears Intact Coordination Gross Motor Movements are Fluid and Coordinated: Yes Fine Motor Movements are Fluid and Coordinated: Yes Finger Nose Finger Test: [facility] Heel Shin Test: [facility]  Motor  Motor Motor: Hemiplegia Motor - Discharge Observations: Mild R hemiplegia; improved from baseline  Mobility Bed Mobility Bed Mobility: Rolling Right;Rolling Left;Sit to Supine;Supine to Sit Rolling Right: Independent with assistive device Rolling Left: Independent with assistive device Supine to Sit: Independent with assistive device Sit to Supine: Independent with assistive device Transfers Transfers: Stand Pivot Transfers Sit to Stand: Independent with assistive device Stand Pivot Transfers: Independent with assistive device Locomotion  Gait Ambulation: Yes Gait Assistance: Independent with assistive device Gait Distance (Feet): 150 Feet Assistive device: Rolling walker Gait Gait: Yes Gait Pattern: Impaired Gait Pattern: Lateral hip instability Stairs / Additional Locomotion Stairs: Yes Stairs Assistance: Supervision/Verbal cueing Stair Management Technique: One rail Left Number of Stairs: 12 Height of Stairs: 6 Wheelchair Mobility Wheelchair Mobility: No  Trunk/Postural Assessment  Cervical Assessment Cervical Assessment: Exceptions to [facility] Thoracic Assessment Thoracic Assessment:  Exceptions to WFL(rounded shoulders ) Lumbar Assessment Lumbar Assessment: Exceptions to WFL(posterior pelvic tilt) Postural Control Postural Control: Within Functional Limits  Balance Standardized Balance Assessment Standardized Balance Assessment: Berg Balance Test Berg Balance Test Sit to Stand: Able to stand  independently using hands Standing  Unsupported: Able to stand safely 2 minutes Sitting with Back Unsupported but Feet Supported on Floor or Stool: Able to sit safely and securely 2 minutes Stand to Sit: Controls descent by using hands Transfers: Able to transfer safely, definite need of hands Standing Unsupported with Eyes Closed: Able to stand 10 seconds safely Standing Ubsupported with Feet Together: Able to place feet together independently and stand 1 minute safely From Standing, Reach Forward with Outstretched Arm: Can reach confidently >25 cm (10") From Standing Position, Pick up Object from Floor: Able to pick up shoe safely and easily From Standing Position, Turn to Look Behind Over each Shoulder: Looks behind from both sides and weight shifts well Turn 360 Degrees: Able to turn 360 degrees safely but slowly Standing Unsupported, Alternately Place Feet on Step/Stool: Able to complete >2 steps/needs minimal assist Standing Unsupported, One Foot in Front: Able to take small step independently and hold 30 seconds Standing on One Leg: Tries to lift leg/unable to hold 3 seconds but remains standing independently Total Score: 43 Dynamic Sitting Balance Dynamic Sitting - Level of Assistance: 6: Modified independent (Device/Increase time) Static Standing Balance Static Standing - Level of Assistance: 6: Modified independent (Device/Increase time) Dynamic Standing Balance Dynamic Standing - Level of Assistance: 6: Modified independent (Device/Increase time)(with UE support) Extremity Assessment      RLE Assessment RLE Assessment: Within Functional Limits LLE Assessment LLE  Assessment: Within Functional Limits    Lorie Phenix 11/08/2018, 11:17 AM

## 2018-11-08 NOTE — Progress Notes (Signed)
Occupational Therapy Session Note  Patient Details  Name: [name] MRN: [id number] Date of Birth: 1951/08/13  Today's Date: 11/08/2018 OT Individual Time: 1230-1325 OT Individual Time Calculation (min): 55 min    Short Term Goals: Week 1:  OT Short Term Goal 1 (Week 1): STG = LTGs due to ELOS  Skilled Therapeutic Interventions/Progress Updates:    1:1. Pt received in recliner with no c/o pian. Pt ambulates with RW with MOD I to/from all tx destinations. Pt completes toileting with MOD I. Pt completes kitchen sweeping activity with MOD I. Educated pt on use of long handled equipment to increase safety and decrease mending and reaching high/low. Pt educated on energy conservation techniques and provided with handout of specific examples of tips for various ADL/IADL tasks. Pt completes standing tolerance activity making no sew blanket to improve dexterity and FMC of RUE using scissor to cut strips of fabric and tye knots. Exited session with pt seated in recliner, call lgith in reach and all needs met  Therapy Documentation Precautions:  Precautions Precaution Comments: R side weakness Restrictions Weight Bearing Restrictions: No General:   Vital Signs:   Pain:   ADL: ADL ADL Comments: mod I with BADLs Vision Baseline Vision/History: Wears glasses Wears Glasses: Distance only;Reading only Patient Visual Report: No change from baseline Vision Assessment?: No apparent visual deficits Perception  Perception: Within Functional Limits Praxis Praxis: Intact Exercises:   Other Treatments:     Therapy/Group: Individual Therapy  Tonny Branch 11/08/2018, 1:28 PM

## 2018-11-08 NOTE — Progress Notes (Signed)
Pt. Got d/c papers and follow up instructions,pt. Ready to go home.

## 2018-11-08 NOTE — Progress Notes (Signed)
Social Work  Discharge Note  The overall goal for the admission was met for:   Discharge location: Yes-HOME WITH HUSBAND WHO CAN BE THERE WITH HER  Length of Stay: Yes-3 DAYS  Discharge activity level: Yes-MOD/I LEVEL  Home/community participation: Yes  Services provided included: MD, RD, PT, OT, RN, CM, Pharmacy and SW  Financial Services: Medicare and Private Insurance: [place]  Follow-up services arranged: Home Health: [place] and Patient/Family request agency HH: PREF USED BEFORE, DME: NO NEEDS  Comments (or additional information):PT DID WELL REACHED GOALS QUICKLY AND READY TO [place]  Patient/Family verbalized understanding of follow-up arrangements: Yes  Individual responsible for coordination of the follow-up plan: SELF & GILBERTA-HUSBAND  Confirmed correct DME delivered: [name] 11/08/2018    [name]

## 2018-11-08 NOTE — Progress Notes (Signed)
[place] PHYSICAL MEDICINE & REHABILITATION PROGRESS NOTE   Subjective/Complaints:  No issues overnite Pt has 5 steps at home and has practiced this in PT   ROS:  No CP, SOB, N/V/D  Objective:   No results found. Recent Labs    11/06/18 0445  WBC 4.7  HGB 9.6*  HCT 30.9*  PLT 538*   Recent Labs    11/06/18 0445  NA 141  K 3.6  CL 106  CO2 29  GLUCOSE 94  BUN 14  CREATININE 0.68  CALCIUM 9.5    Intake/Output Summary (Last 24 hours) at 11/08/2018 0721 Last data filed at 11/07/2018 8563 Gross per 24 hour  Intake 120 ml  Output -  Net 120 ml     Physical Exam: Vital Signs Blood pressure 128/74, pulse 75, temperature 97.9 F (36.6 C), temperature source Oral, resp. rate 14, height 5\' 3"  (1.6 m), weight 78.2 kg, last menstrual period 12/20/2002, SpO2 93 %.   General: No acute distress Mood and affect are appropriate Heart: Regular rate and rhythm no rubs murmurs or extra sounds Lungs: Clear to auscultation, breathing unlabored, no rales or wheezes Abdomen: Positive bowel sounds, soft nontender to palpation, nondistended Extremities: No clubbing, cyanosis, or edema Skin: No evidence of breakdown, no evidence of rash Neurologic: Cranial nerves II through XII intact, motor strength is 5/5 in Left and 4/5 RIght  deltoid, bicep, tricep, grip, hip flexor, knee extensors, ankle dorsiflexor and plantar flexor  Musculoskeletal: Full range of motion in all 4 extremities. No joint swelling   Assessment/Plan: 1. Functional deficits secondary to Left corona radiata infarct which require 3+ hours per day of interdisciplinary therapy in a comprehensive inpatient rehab setting.  Physiatrist is providing close team supervision and 24 hour management of active medical problems listed below.  Physiatrist and rehab team continue to assess barriers to discharge/monitor patient progress toward functional and medical goals  Care Tool:  Bathing    Body parts bathed by  patient: Face     Body parts n/a: Right lower leg(unna boot)   Bathing assist Assist Level: Supervision/Verbal cueing     Upper Body Dressing/Undressing Upper body dressing   What is the patient wearing?: Hospital gown only    Upper body assist Assist Level: Set up assist    Lower Body Dressing/Undressing Lower body dressing      What is the patient wearing?: Underwear/pull up, Pants     Lower body assist Assist for lower body dressing: Set up assist     Toileting Toileting    Toileting assist Assist for toileting: Supervision/Verbal cueing Assistive Device Comment: Front wheel walker   Transfers Chair/bed transfer  Transfers assist     Chair/bed transfer assist level: Contact Guard/Touching assist     Locomotion Ambulation   Ambulation assist      Assist level: Supervision/Verbal cueing Assistive device: Walker-rolling Max distance: 122ft   Walk 10 feet activity   Assist     Assist level: Supervision/Verbal cueing     Walk 50 feet activity   Assist    Assist level: Minimal Assistance - Patient > 75%      Walk 150 feet activity   Assist    Assist level: Supervision/Verbal cueing      Walk 10 feet on uneven surface  activity   Assist     Assist level: Minimal Assistance - Patient > 75%     Wheelchair     Assist Will patient use wheelchair at discharge?: No   Wheelchair  activity did not occur: N/A         Wheelchair 50 feet with 2 turns activity    Assist    Wheelchair 50 feet with 2 turns activity did not occur: N/A       Wheelchair 150 feet activity     Assist Wheelchair 150 feet activity did not occur: N/A         Medical Problem List and Plan: 1.Right side weakness, facial droop and slurred speechsecondary to 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  Antithrombotics: -DVT/anticoagulation:Eliquis. Venous Dopplers negative -antiplatelet therapy: Aspirin 81 mg daily 3. Pain Management:Neurontin 600 mg twice a day,Cymbalta 60 mg daily at bedtime, oxycodone as needed 4. Mood:Ativan 0.5 mg daily as needed -antipsychotic agents: see above 5. Neuropsych: This patientiscapable of making decisions on herown behalf. 6. Skin/Wound Care/chronic right lower extremity ulcer8 weeks with weekly changes of UNNA boots followed at the [facility] wound center. Routine skin checks. Consult WOC for [employer] change- appreciate their note, ortho tech to change UNNA 7. Fluids/Electrolytes/Nutrition:Routine in and out's with follow-up chemistries 8. Hypertension. Lisinopril 10 mg daily- controlled 3/20 Vitals:   11/07/18 1933 11/08/18 0520  BP: 125/70 128/74  Pulse: 79 75  Resp: 15 14  Temp: 98.7 F (37.1 C) 97.9 F (36.6 C)  SpO2: 97% 93%  9. Wegener's granulomatosis. Continue home regimen of Imuran 100 mg daily, prednisone 5 mg daily 10. Hyperlipidemia. Lipitor  11.  Anemia normochromic , normocytic stable at 9.6  LOS: 3 days A FACE TO FACE EVALUATION WAS PERFORMED  Charlett Blake 11/08/2018, 7:21 AM

## 2018-11-08 NOTE — Discharge Instructions (Signed)
Inpatient Rehab Discharge Instructions  [name] Discharge date and time: No discharge date for patient encounter.   Activities/Precautions/ Functional Status: Activity: activity as tolerated Diet: regular diet Wound Care: none needed Functional status:  ___ No restrictions     ___ Walk up steps independently ___ 24/7 supervision/assistance   ___ Walk up steps with assistance ___ Intermittent supervision/assistance  ___ Bathe/dress independently ___ Walk with walker     _x__ Bathe/dress with assistance ___ Walk Independently    ___ Shower independently ___ Walk with assistance    ___ Shower with assistance ___ No alcohol     ___ Return to work/school ________  Special Instructions: No driving.  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   COMMUNITY REFERRALS UPON DISCHARGE:    Home Health:   [facility]   NTIRW:[phone]   Date of last service:11/09/2018  Medical Equipment/Items Ordered:NO NEEDS HAS ALL EQUIPMENT     GENERAL COMMUNITY RESOURCES FOR PATIENT/FAMILY: Support Groups:[facility] SUPPORT GROUP THE SECOND Thursday @ 6:00-7:00 PM ON THE REHAB UNIT QUESTIONS CONTACT AMY [phone]  STROKE/TIA DISCHARGE INSTRUCTIONS SMOKING Cigarette smoking nearly doubles your risk of having a stroke & is the single most alterable risk factor  If you smoke or have smoked in the last 12 months, you are advised to quit smoking for your health.  Most of the excess cardiovascular risk related to smoking disappears within a year of stopping.  Ask you doctor about anti-smoking medications  [place] Quit Line: 1-800-QUIT NOW  Free Smoking Cessation Classes [phone]  CHOLESTEROL Know your levels; limit fat & cholesterol in your diet  Lipid Panel     Component Value Date/Time   CHOL 132  11/04/2018 0628   TRIG 150 (H) 11/04/2018 0628   HDL 43 11/04/2018 0628   CHOLHDL 3.1 11/04/2018 0628   VLDL 30 11/04/2018 0628   LDLCALC 59 11/04/2018 0628      Many patients benefit from treatment even if their cholesterol is at goal.  Goal: Total Cholesterol (CHOL) less than 160  Goal:  Triglycerides (TRIG) less than 150  Goal:  HDL greater than 40  Goal:  LDL (LDLCALC) less than 100   BLOOD PRESSURE American Stroke Association blood pressure target is less that 120/80 mm/Hg  Your discharge blood pressure is:  BP: (!) 146/79  Monitor your blood pressure  Limit your salt and alcohol intake  Many individuals will require more than one medication for high blood pressure  DIABETES (A1c is a blood sugar average for last 3 months) Goal HGBA1c is under 7% (HBGA1c is blood sugar average for last 3 months)  Diabetes: No known diagnosis of diabetes    Lab Results  Component Value Date   HGBA1C 5.3 11/04/2018     Your HGBA1c can be lowered with medications, healthy diet, and exercise.  Check your blood sugar as directed by your physician  Call your physician if you experience unexplained or low blood sugars.  PHYSICAL ACTIVITY/REHABILITATION Goal is 30 minutes at least 4 days per week  Activity: Increase activity slowly, Therapies: Physical Therapy: Home Health Return to work:   Activity decreases your risk of heart attack and stroke and makes your heart stronger.  It helps control your weight and blood pressure; helps you relax and can improve your mood.  Participate in a regular exercise  program.  Talk with your doctor about the best form of exercise for you (dancing, walking, swimming, cycling).  DIET/WEIGHT Goal is to maintain a healthy weight  Your discharge diet is:  Diet Order            Diet Heart Room service appropriate? Yes; Fluid consistency: Thin  Diet effective now              liquids Your height is:  Height: 5\' 3"  (160 cm) Your current weight is:  Weight: 78.2 kg Your Body Mass Index (BMI) is:  BMI (Calculated): 30.55  Following the type of diet specifically designed for you will help prevent another stroke.  Your goal weight range is:    Your goal Body Mass Index (BMI) is 19-24.  Healthy food habits can help reduce 3 risk factors for stroke:  High cholesterol, hypertension, and excess weight.  RESOURCES Stroke/Support Group:  Call [phone]   STROKE EDUCATION PROVIDED/REVIEWED AND GIVEN TO PATIENT Stroke warning signs and symptoms How to activate emergency medical system (call 911). Medications prescribed at discharge. Need for follow-up after discharge. Personal risk factors for stroke. Pneumonia vaccine given:  Flu vaccine given:  My questions have been answered, the writing is legible, and I understand these instructions.  I will adhere to these goals & educational materials that have been provided to me after my discharge from the hospital.      My questions have been answered and I understand these instructions. I will adhere to these goals and the provided educational materials after my discharge from the hospital.  Patient/Caregiver Signature _______________________________ Date __________  Clinician Signature _______________________________________ Date __________  Please bring this form and your medication list with you to all your follow-up doctor's appointments.

## 2018-11-08 NOTE — Progress Notes (Signed)
Occupational Therapy Discharge Summary  Patient Details  Name: [name] MRN: [id number] Date of Birth: Dec 23, 1950   Patient has met 8 of 8 long term goals due to improved balance, postural control, ability to compensate for deficits, functional use of  RIGHT upper and RIGHT lower extremity and improved coordination.  Patient to discharge at overall Modified Independent level.  Patient's care partner is independent to provide the necessary physical assistance at discharge.    Reasons goals not met: n/a  Recommendation:  Patient will benefit from ongoing skilled OT services in home health setting to continue to advance functional skills in the area of iADL.  Equipment: No equipment provided  Reasons for discharge: treatment goals met  Patient/family agrees with progress made and goals achieved: Yes  OT Discharge Precautions/Restrictions  Precautions Precaution Comments: R side weakness Restrictions Weight Bearing Restrictions: No  ADL ADL ADL Comments: mod I with BADLs Vision Baseline Vision/History: Wears glasses Wears Glasses: Distance only;Reading only Patient Visual Report: No change from baseline Vision Assessment?: No apparent visual deficits Perception  Perception: Within Functional Limits Praxis Praxis: Intact Cognition Overall Cognitive Status: Within Functional Limits for tasks assessed Orientation Level: Oriented X4 Sustained Attention: Appears intact Selective Attention: Appears intact Memory: Appears intact Awareness: Appears intact Problem Solving: Appears intact Safety/Judgment: Appears intact Sensation Sensation Light Touch: Appears Intact Hot/Cold: Appears Intact Proprioception: Appears Intact Stereognosis: Appears Intact Coordination Gross Motor Movements are Fluid and Coordinated: Yes Fine Motor Movements are Fluid and Coordinated: Yes Finger Nose Finger Test: [facility] Heel Shin Test: [facility]  Motor  Motor Motor: Hemiplegia Motor -  Discharge Observations: Mild R hemiplegia; improved from baseline Mobility  Bed Mobility Bed Mobility: Rolling Right;Rolling Left;Sit to Supine;Supine to Sit Rolling Right: Independent with assistive device Rolling Left: Independent with assistive device Supine to Sit: Independent with assistive device Sit to Supine: Independent with assistive device Transfers Sit to Stand: Independent with assistive device  Trunk/Postural Assessment  Cervical Assessment Cervical Assessment: Exceptions to [facility] Thoracic Assessment Thoracic Assessment: Exceptions to WFL(rounded shoulders ) Lumbar Assessment Lumbar Assessment: Exceptions to WFL(posterior pelvic tilt) Postural Control Postural Control: Within Functional Limits  Balance Standardized Balance Assessment Standardized Balance Assessment: Berg Balance Test Berg Balance Test Sit to Stand: Able to stand  independently using hands Standing Unsupported: Able to stand safely 2 minutes Sitting with Back Unsupported but Feet Supported on Floor or Stool: Able to sit safely and securely 2 minutes Stand to Sit: Controls descent by using hands Transfers: Able to transfer safely, definite need of hands Standing Unsupported with Eyes Closed: Able to stand 10 seconds safely Standing Ubsupported with Feet Together: Able to place feet together independently and stand 1 minute safely From Standing, Reach Forward with Outstretched Arm: Can reach confidently >25 cm (10") From Standing Position, Pick up Object from Floor: Able to pick up shoe safely and easily From Standing Position, Turn to Look Behind Over each Shoulder: Looks behind from both sides and weight shifts well Turn 360 Degrees: Able to turn 360 degrees safely but slowly Standing Unsupported, Alternately Place Feet on Step/Stool: Able to complete >2 steps/needs minimal assist Standing Unsupported, One Foot in Front: Able to take small step independently and hold 30 seconds Standing on One Leg:  Tries to lift leg/unable to hold 3 seconds but remains standing independently Total Score: 43 Dynamic Sitting Balance Dynamic Sitting - Level of Assistance: 6: Modified independent (Device/Increase time) Static Standing Balance Static Standing - Level of Assistance: 6: Modified independent (Device/Increase time) Dynamic Standing Balance  Dynamic Standing - Level of Assistance: 6: Modified independent (Device/Increase time)(with UE support) Extremity/Trunk Assessment RUE Assessment General Strength Comments: strength grossly 4/5 Brunstrum level for arm: Stage V Relative Independence from Synergy Brunstrum level for hand: Stage VI Isolated joint movements LUE Assessment LUE Assessment: Within Functional Limits General Strength Comments: grossly 4+/5   SAGUIER,JULIA 11/08/2018, 12:31 PM

## 2018-11-08 NOTE — Discharge Summary (Signed)
Physician Discharge Summary  Patient ID: [name] MRN: [id number] DOB/AGE: [birth year] 67 y.o.  Admit date: 11/05/2018 Discharge date: 11/08/2018  Discharge Diagnoses:  Active Problems:   Lacunar infarct, acute (HCC) DVT prophylaxis Chronic right lower extremity wound Hypertension Wegener's granulomatosis Hyperlipidemia Constipation  Discharged Condition: Stable  Significant Diagnostic Studies: Mr Virgel [place] Contrast  Result Date: 11/03/2018 CLINICAL DATA:  68 year old female code stroke with right side weakness and questionable hyperdense left MCA on plain head CT today. EXAM: MRI HEAD WITHOUT CONTRAST LIMITED MRA HEAD WITHOUT CONTRAST TECHNIQUE: Limited pulse sequences of the brain and surrounding structures were obtained without intravenous contrast. Angiographic images of the head were obtained using MRA technique without contrast. COMPARISON:  Head CT 1606 hours today . FINDINGS: MRI HEAD FINDINGS DWI and axial FLAIR imaging only was obtained due to acute code stroke presentation. These reveal a small area of restricted diffusion in the posterior left corona radiata and left posterior lentiform along the margins of a chronic lacunar infarct (series 3, images 37 and 38). There is only mild associated FLAIR hyperintensity limited to the lentiform. No other No restricted diffusion or evidence of acute infarction. No midline shift, mass effect, or evidence of intracranial mass lesion. No evidence of intracranial hemorrhage. Scattered additional bilateral cerebral white matter FLAIR hyperintensity. MRA HEAD FINDINGS Antegrade flow in the posterior circulation with mildly dominant distal left vertebral artery. Highly tortuous vertebrobasilar junction but no distal vertebral stenosis. Both PICA origins remain normal. Tortuous basilar artery without stenosis. Normal SCA origins. Fetal type left PCA origin. Normal right PCA origin and posterior communicating artery. Bilateral PCA branches  are within normal limits. Antegrade flow in both ICA siphons. Tortuous cervical right ICA just below the skull base. Mild siphon dolichoectasia without stenosis. Ophthalmic and posterior communicating artery origins appear normal. Patent carotid termini. Normal MCA and ACA origins. Tortuous proximal ACAs. Anterior communicating artery is within normal limits. Visible ACA branches are within normal limits. Right MCA M1, bifurcation, and visible right MCA branches are within normal limits. Left MCA M1 segment is patent to the bifurcation without stenosis. Left MCA bifurcation and visible left MCA branches are within normal limits. IMPRESSION: 1. Negative for large vessel occlusion. 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 2. Positive for acute small vessel infarct along the posterior margins of the chronic lacune of the left corona radiata and lentiform. No evidence of associated hemorrhage or mass effect. These results were communicated to Dr. Cheral Marker at 6:08 pmon 3/15/2020by text page via the [facility] messaging system. Electronically Signed   By: Genevie Ann M.D.   On: 11/03/2018 18:08   Mr Brain Wo Contrast  Result Date: 11/03/2018 CLINICAL DATA:  68 year old female code stroke with right side weakness and questionable hyperdense left MCA on plain head CT today. EXAM: MRI HEAD WITHOUT CONTRAST LIMITED MRA HEAD WITHOUT CONTRAST TECHNIQUE: Limited pulse sequences of the brain and surrounding structures were obtained without intravenous contrast. Angiographic images of the head were obtained using MRA technique without contrast. COMPARISON:  Head CT 1606 hours today . FINDINGS: MRI HEAD FINDINGS DWI and axial FLAIR imaging only was obtained due to acute code stroke presentation. These reveal a small area of restricted diffusion in the posterior left corona radiata and left posterior lentiform along the margins of a chronic lacunar infarct (series 3, images 37 and 38). There is only mild associated FLAIR  hyperintensity limited to the lentiform. No other No restricted diffusion or evidence of acute infarction. No midline shift, mass effect, or  evidence of intracranial mass lesion. No evidence of intracranial hemorrhage. Scattered additional bilateral cerebral white matter FLAIR hyperintensity. MRA HEAD FINDINGS Antegrade flow in the posterior circulation with mildly dominant distal left vertebral artery. Highly tortuous vertebrobasilar junction but no distal vertebral stenosis. Both PICA origins remain normal. Tortuous basilar artery without stenosis. Normal SCA origins. Fetal type left PCA origin. Normal right PCA origin and posterior communicating artery. Bilateral PCA branches are within normal limits. Antegrade flow in both ICA siphons. Tortuous cervical right ICA just below the skull base. Mild siphon dolichoectasia without stenosis. Ophthalmic and posterior communicating artery origins appear normal. Patent carotid termini. Normal MCA and ACA origins. Tortuous proximal ACAs. Anterior communicating artery is within normal limits. Visible ACA branches are within normal limits. Right MCA M1, bifurcation, and visible right MCA branches are within normal limits. Left MCA M1 segment is patent to the bifurcation without stenosis. Left MCA bifurcation and visible left MCA branches are within normal limits. IMPRESSION: 1. Negative for large vessel occlusion. 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 2. Positive for acute small vessel infarct along the posterior margins of the chronic lacune of the left corona radiata and lentiform. No evidence of associated hemorrhage or mass effect. These results were communicated to Dr. Cheral Marker at 6:08 pmon 3/15/2020by text page via the [facility] messaging system. Electronically Signed   By: Genevie Ann M.D.   On: 11/03/2018 18:08   Ct Head Code Stroke Wo Contrast  Result Date: 11/03/2018 CLINICAL DATA:  Code stroke. 68 year old female with right side weakness. EXAM: CT HEAD  WITHOUT CONTRAST TECHNIQUE: Contiguous axial images were obtained from the base of the skull through the vertex without intravenous contrast. COMPARISON:  Paranasal sinus CT 08/26/2010. FINDINGS: Brain: Chronic appearing lacunar infarct of the left corona radiata is new since 2012 on series 3, image 20. Mild additional left hemisphere white matter hypodensity. Vascular: Mild Calcified atherosclerosis at the skull base. Mildly hyperdense left MCA in the sylvian fissure on series 6, images 40 and 41 is conspicuously asymmetric. Skull: No cortically based acute infarct identified. No midline shift, ventriculomegaly, mass effect, evidence of mass lesion, intracranial hemorrhage or evidence of cortically based acute infarction. Sinuses/Orbits: Well pneumatized. Mild bubbly opacity in the right sphenoid. Tympanic cavities and mastoids are clear. Other: Visualized orbits and scalp soft tissues are within normal limits. ASPECTS [facility] Stroke Program Early CT Score) - Ganglionic level infarction (caudate, lentiform nuclei, internal capsule, insula, M1-M3 cortex): 7 - Supraganglionic infarction (M4-M6 cortex): 3 Total score (0-10 with 10 being normal): 10 IMPRESSION: 1. Suspicious left MCA density in the Sylvian fissure. But no CT changes of cortically based infarct. ASPECTS is 10.   No acute intracranial hemorrhage identified. 2. 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 3. These results were communicated to Dr. Cheral Marker at [place] 3/15/2020by text page via the [facility] messaging system. Electronically Signed   By: Genevie Ann M.D.   On: 11/03/2018 16:17   Vas US Carotid (at [place] Only)  Result Date: 11/04/2018 Carotid Arterial Duplex Study Indications:  CVA and Weakness. Risk Factors: Hypertension, hyperlipidemia. Performing Technologist: Sharion Dove RVS  Examination Guidelines: A complete evaluation includes B-mode imaging, spectral Doppler, color Doppler, and power Doppler as  needed of all accessible portions of each vessel. Bilateral testing is considered an integral part of a complete examination. Limited examinations for reoccurring indications may be performed as noted.  Right Carotid Findings: +----------+--------+--------+--------+------------+--------+  PSV cm/sEDV cm/sStenosisDescribe    Comments +----------+--------+--------+--------+------------+--------+ CCA Prox  105     17              homogeneous          +----------+--------+--------+--------+------------+--------+ CCA Distal81      23              homogeneous          +----------+--------+--------+--------+------------+--------+ ICA Prox  86      27              heterogenous         +----------+--------+--------+--------+------------+--------+ ICA Distal69      23                                   +----------+--------+--------+--------+------------+--------+ ECA       81      17                                   +----------+--------+--------+--------+------------+--------+ +----------+--------+-------+--------+-------------------+           PSV cm/sEDV cmsDescribeArm Pressure (mmHG) +----------+--------+-------+--------+-------------------+ [id number]                                         +----------+--------+-------+--------+-------------------+ +---------+--------+--+--------+--+ VertebralPSV cm/s41EDV cm/s15 +---------+--------+--+--------+--+  Left Carotid Findings: +----------+--------+--------+--------+------------+--------+           PSV cm/sEDV cm/sStenosisDescribe    Comments +----------+--------+--------+--------+------------+--------+ CCA Prox  87      22              homogeneous          +----------+--------+--------+--------+------------+--------+ CCA Distal70      22              homogeneous          +----------+--------+--------+--------+------------+--------+ ICA Prox  82      33              heterogenous          +----------+--------+--------+--------+------------+--------+ ICA Distal118     43                                   +----------+--------+--------+--------+------------+--------+ ECA       93      18                                   +----------+--------+--------+--------+------------+--------+ +----------+--------+--------+--------+-------------------+ SubclavianPSV cm/sEDV cm/sDescribeArm Pressure (mmHG) +----------+--------+--------+--------+-------------------+           184                                         +----------+--------+--------+--------+-------------------+ +---------+--------+--+--------+--+ VertebralPSV cm/s77EDV cm/s19 +---------+--------+--+--------+--+  Summary: Right Carotid: Velocities in the right ICA are consistent with a 1-39% stenosis. Left Carotid: Velocities in the left ICA are consistent with a 1-39% stenosis. Vertebrals:  Bilateral vertebral arteries demonstrate antegrade flow. Subclavians: Normal flow hemodynamics were seen in bilateral subclavian              arteries. *See  table(s) above for measurements and observations.  Electronically signed by Antony Contras MD on 11/04/2018 at 1:29:47 PM.    Final    Vas [place] Lower Extremity Venous (dvt)  Result Date: 11/04/2018  Lower Venous Study Indications: Stroke.  Limitations: Bandages and body habitus. Comparison Study: No prior study on file Performing Technologist: Sharion Dove RVS  Examination Guidelines: A complete evaluation includes B-mode imaging, spectral Doppler, color Doppler, and power Doppler as needed of all accessible portions of each vessel. Bilateral testing is considered an integral part of a complete examination. Limited examinations for reoccurring indications may be performed as noted.  Right Venous Findings: +---------+---------------+---------+-----------+----------+------------------+          CompressibilityPhasicitySpontaneityPropertiesSummary             +---------+---------------+---------+-----------+----------+------------------+ CFV      Full                                                            +---------+---------------+---------+-----------+----------+------------------+ SFJ      Full                                                            +---------+---------------+---------+-----------+----------+------------------+ FV Prox  Full                                                            +---------+---------------+---------+-----------+----------+------------------+ FV Mid   Full                                                            +---------+---------------+---------+-----------+----------+------------------+ FV DistalFull                                                            +---------+---------------+---------+-----------+----------+------------------+ PFV      Full                                                            +---------+---------------+---------+-----------+----------+------------------+ POP      Full                                                            +---------+---------------+---------+-----------+----------+------------------+ PTV      Full  proximal portion                                                         only               +---------+---------------+---------+-----------+----------+------------------+ PERO                                                  Not visualized     +---------+---------------+---------+-----------+----------+------------------+ GSV      Full                                                            +---------+---------------+---------+-----------+----------+------------------+ Patient with Ardelia Mems boot  Right Technical Findings: Not visualized segments include peroneal vein and segments of the posterior tibial.  Left Venous Findings:  +---------+---------------+---------+-----------+----------+-------+          CompressibilityPhasicitySpontaneityPropertiesSummary +---------+---------------+---------+-----------+----------+-------+ CFV      Full           Yes      Yes                          +---------+---------------+---------+-----------+----------+-------+ SFJ      Full                                                 +---------+---------------+---------+-----------+----------+-------+ FV Prox  Full                                                 +---------+---------------+---------+-----------+----------+-------+ FV Mid   Full                                                 +---------+---------------+---------+-----------+----------+-------+ FV DistalFull                                                 +---------+---------------+---------+-----------+----------+-------+ PFV      Full                                                 +---------+---------------+---------+-----------+----------+-------+ POP      Full           Yes      Yes                          +---------+---------------+---------+-----------+----------+-------+  PTV      Full                                                 +---------+---------------+---------+-----------+----------+-------+  Left Technical Findings: Not visualized segments include peroneal vein.   Summary: Right: There is no evidence of deep vein thrombosis in the lower extremity. However, portions of this examination were limited- see technologist comments above. Left: There is no evidence of deep vein thrombosis in the lower extremity. However, portions of this examination were limited- see technologist comments above.  *See table(s) above for measurements and observations. Electronically signed by Servando Snare MD on 11/04/2018 at 2:18:42 PM.    Final     Labs:  Basic Metabolic Panel: Recent Labs  Lab 11/03/18 1600 11/03/18 1606  11/04/18 0628 11/05/18 0433 11/06/18 0445  NA 143  --  142 142 141  K 4.1  --  3.4* 3.9 3.6  CL 103  --  107 107 106  CO2 30  --  29 28 29   GLUCOSE 95  --  92 91 94  BUN 24*  --  16 13 14   CREATININE 0.84 2.20*  2.20* 0.66 0.68 0.68  CALCIUM 9.9  --  8.8* 9.4 9.5    CBC: Recent Labs  Lab 11/03/18 1600 11/04/18 0628 11/06/18 0445  WBC 7.0 6.4 4.7  NEUTROABS 6.0  --  3.3  HGB 9.7* 9.2* 9.6*  HCT 31.5* 29.0* 30.9*  MCV 116.2* 112.4* 111.6*  PLT 522* 515* 538*    CBG: No results for input(s): GLUCAP in the last 168 hours.  Family history.mother and father with history of hypertension. Father with lung lung cancer as well as CVAand DVT. Maternal uncle and maternal aunt with diabetes mellitus.  Negative for colon cancer.   Brief HPI:    EVANGALINE JOU is a 68 year old right-handed female history of hypertension maintained on HCTZ, hyperlipidemia, GERD, chronic right lower extremity wound 8 weeks followed at [facility] wound center with weekly changes of an Haematologist, Wegener's granulomatosis maintained on Imuran as well as chronic prednisone 5 mg daily followed by rheumatology services at [facility], DVT, obstructive sleep apnea as well as history of pulmonary emboli maintained on eliquis. Received inpatient rehabilitation services 2012 debility related to Wegener's granulomatosis. Per chart review lives with spouse independent and driving prior to admission. Presented 11/03/2018 with right-sided weakness facial droop and slurred speech of acute onset. Cranial CT scan suspicious for left MCA density in the sylvian fissure. Patient did not receive TPA. 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 No evidence associated hemorrhage. MRI negative for large vessel occlusion. Echocardiogram with ejection fraction of 60% and normal systolic function. Neurology follow-up patient remained on eliquis  as well as the addition of aspirin. Tolerating a regular diet. Bilateral lower extremity Dopplers negative. Carotid Dopplers negative. Therapy evaluations completed and patient was admitted for a compress of rehabilitation program.  Review of systems Constitutional. Negative for chills and fevers HEENT. Negative for hearing loss and tinnitus Eyes. Negative for blurred vision and double vision Respiratory. Negative for cough or shortness of breath. Cardiovascular. Positive for leg swelling. Negative for chest pain or palpitations. Gastrointestinal. Positive for constipation. Negative for heartburn, nausea or vomiting. Positive for GERD. Genitourinary. Negative for flank pain or  hematuria. Musculoskeletal. Positive for joint pain and myalgias Skin. Chronic right lower extremity wound Neurological. Positive for speech change and weakness Psychiatric. Positive insomnia  All other systems reviewed and are negative  Hospital Course: Antonae Zbikowski was admitted to rehab 11/05/2018 for inpatient therapies to consist of PT, ST and OT at least three hours five days a week. Past admission physiatrist, therapy team and rehab RN have worked together to provide customized collaborative inpatient rehab. 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 Vascular studies negative. Pain management with the use of Neurontin 600 mg twice daily as well as Cymbalta at bedtime with oxycodone for breakthrough pain. Mood stabilization with Ativan only as needed she was cooperative with therapies. Patient with long history of Wegener's granulomatosis she remained on Imuran as well as chronic prednisone and would follow up with [facility] rheumatology services. Chronic right lower extremity wound 8 weeks weekly changes of Unna boot followed  at the [facility] wound center. Blood pressure remained controlled with lisinopril and HCTZ resumed she would follow up with her primary M.D. She did continue on Lipitor for hyperlipidemia.  Physical exam. Blood pressure 141/81, pulse 88 temp 97 9 respirations 17 oxygen saturation is 96% Constitutional. Well-developed well-nourished HEENT. Head. Normocephalic and atraumatic Eyes. Pupils are equal round and reactive to light no discharge no nystagmus EOMs are normal Neck. Normal range of motion no JVD without thyromegaly or no tracheal deviation without bruit Cardiovascular. Normal rate no friction rub or murmur heard Respiratory. Effort normal no respiratory distress without wheezes GI. Soft exhibits no distention no abdominal tenderness without rebound Musculoskeletal. Normal range of motion no edema Neurological. Patient is alert and appropriate. Mild right facial weakness and tongue deviation. Speech was clear language normal with reasonable insight and awareness. Since his pain and light touch in all fours. Right upper extremity 4 minus out of 5 left upper extremity 4 out of 5 right lower extremity 3+ to 4 out of 5 proximal to distal and 4 out of 5 proximal to distal left lower extremity Skin. Unna boot in place to right lower extremity for chronic wound no odor noted Psychiatric Normal mood and affect cooperative with exam.   Rehab course: During patient's stay in rehab weekly team conferences were held to monitor patient's progress, set goals and discuss barriers to discharge. At admission, patient required minimal assist to ambulate 80 feet rolling walker, moderate assist sit to stand, minimal assist supine to sit. Minimal assist upper body bathing and upper body dressing. Minimal assessed standing for lower body dressing and bathing. Minimal assist toilet transfers.  He/She  has had improvement in activity tolerance, balance, postural control as well as ability to compensate for deficits.  He/She has had improvement in functional use RUE/LUE  and RLE/LLE as well as improvement in awareness. Patient was requiring supervision assist overall for mobility with and without assistive device ambulating 150 feet 4 working with energy conservation techniques. Sit to stand without cues or assistance. Improved step length, weight shifting and posture with rolling walker compared to no assistive device. NuStep reciprocal movement training with supervision assist. Perform toilet transfers with supervision assist and rolling walker. Patient ambulates to the bathroom with rolling walker and supervision completing toileting with supervision. Ambulated to the ADL apartment with rolling walker and supervision. Engaged in furniture transfers with rolling walker with supervision, completed  simulated meal prep with patient able to maintain as well as obtain items from various heights and surfaces. Full family teaching was completed advised no driving she was discharged to home.       Disposition: Discharged to home   Diet: regular  Special Instructions: No driving Follow-up with [place] for ongoing care of chronic right lower extremity wound with changing of Unna boot weekly. Patient does have an appointment made  Medications at discharge. 1. Tylenol as needed 2.Eliquis 2.5 mg twice a day 3. Aspirin 81 mg by mouth daily 4. Lipitor 40 mg by mouth daily 5.Imuran 100 mg by mouth daily 6. Os-Cal 1250 mg daily 7. Vitamin D 1000 units by mouth twice a day 8. Cymbalta 60 mg daily at bedtime 9. Neurontin 600 mg by mouth twice a day 10. Hydroxyurea 1000 mg daily and 500 mg daily at bedtime 11. Lisinopril 10 mg by mouth daily 12.Ativan 0.5 mg daily as needed anxiety 13. Multivitamin daily 14. Oxycodone 1 tablet every 6 hours as needed moderate pain 15. Prednisone 5 mg by mouth daily   Discharge Instructions    Ambulatory referral to Neurology   Complete by:  As directed    An  appointment is requested in approximately 4 weeks left corona radiata infarction   Ambulatory referral to Physical Medicine Rehab   Complete by:  As directed    Moderate complexity follow-up 1-2 weeks left corona radiata infarction      Follow-up Information    Kirsteins, Luanna Salk, MD Follow up.   Specialty:  Physical Medicine and Rehabilitation Why:  office to call for appointment Contact information: [place] [address] [phone]        Crist Infante, MD Follow up.   Specialty:  Internal Medicine Why:  MD OFFICE WILL CONTACT PATIENT TO SET UP FOLLOW UP APPOINTMENT Contact information: [address] [place] [address] [phone]           Signed: Lavon Paganini [place] 11/08/2018, 5:59 AM

## 2018-11-09 DIAGNOSIS — Z7952 Long term (current) use of systemic steroids: Secondary | ICD-10-CM | POA: Diagnosis not present

## 2018-11-09 DIAGNOSIS — I69351 Hemiplegia and hemiparesis following cerebral infarction affecting right dominant side: Secondary | ICD-10-CM | POA: Diagnosis not present

## 2018-11-09 DIAGNOSIS — Z86711 Personal history of pulmonary embolism: Secondary | ICD-10-CM | POA: Diagnosis not present

## 2018-11-09 DIAGNOSIS — Z79899 Other long term (current) drug therapy: Secondary | ICD-10-CM | POA: Diagnosis not present

## 2018-11-09 DIAGNOSIS — I69392 Facial weakness following cerebral infarction: Secondary | ICD-10-CM | POA: Diagnosis not present

## 2018-11-09 DIAGNOSIS — G629 Polyneuropathy, unspecified: Secondary | ICD-10-CM | POA: Diagnosis not present

## 2018-11-09 DIAGNOSIS — Z86718 Personal history of other venous thrombosis and embolism: Secondary | ICD-10-CM | POA: Diagnosis not present

## 2018-11-09 DIAGNOSIS — I69328 Other speech and language deficits following cerebral infarction: Secondary | ICD-10-CM | POA: Diagnosis not present

## 2018-11-09 DIAGNOSIS — I10 Essential (primary) hypertension: Secondary | ICD-10-CM | POA: Diagnosis not present

## 2018-11-11 ENCOUNTER — Telehealth: Payer: Self-pay | Admitting: *Deleted

## 2018-11-11 ENCOUNTER — Telehealth: Payer: Self-pay | Admitting: Registered Nurse

## 2018-11-11 DIAGNOSIS — I69351 Hemiplegia and hemiparesis following cerebral infarction affecting right dominant side: Secondary | ICD-10-CM | POA: Diagnosis not present

## 2018-11-11 DIAGNOSIS — I69392 Facial weakness following cerebral infarction: Secondary | ICD-10-CM | POA: Diagnosis not present

## 2018-11-11 DIAGNOSIS — I10 Essential (primary) hypertension: Secondary | ICD-10-CM | POA: Diagnosis not present

## 2018-11-11 DIAGNOSIS — G629 Polyneuropathy, unspecified: Secondary | ICD-10-CM | POA: Diagnosis not present

## 2018-11-11 DIAGNOSIS — I69328 Other speech and language deficits following cerebral infarction: Secondary | ICD-10-CM | POA: Diagnosis not present

## 2018-11-11 DIAGNOSIS — Z86718 Personal history of other venous thrombosis and embolism: Secondary | ICD-10-CM | POA: Diagnosis not present

## 2018-11-11 NOTE — Telephone Encounter (Signed)
Transitional Care call Transitional Call Completed   Patient name: Modelle Vollmer DOB: 11-15-1950 1. Are you/is patient experiencing any problems since coming home? No a. Are there any questions regarding any aspect of care? No 2. Are there any questions regarding medications administration/dosing? No a. Are meds being taken as prescribed? Yes  b. "Patient should review meds with caller to confirm" Medication List Reviewed 3. Have there been any falls? No 4. Has Home Health been to the house and/or have they contacted you? Yes, [facility] a. If not, have you tried to contact them? NA b. Can we help you contact them? NA 5. Are bowels and bladder emptying properly? Yes. a. Are there any unexpected incontinence issues? No b. If applicable, is patient following bowel/bladder programs? NA 6. Any fevers, problems with breathing, unexpected pain? No 7. Are there any skin problems or new areas of breakdown? No 8. Has the patient/family member arranged specialty MD follow up (ie cardiology/neurology/renal/surgical/etc.)?  Ms. Fischl will be calling [facility] to schedule HFU today. She has an appointment scheduled with her PCP she reports.  a. Can we help arrange? NA 9. Does the patient need any other services or support that we can help arrange? No 10. Are caregivers following through as expected in assisting the patient? Yes 11. Has the patient quit smoking, drinking alcohol, or using drugs as recommended? Ms. Neace reports she doesn't smoke, drink alcohol or use illicit drugs.   Appointment date/time 12/09/2018  arrival time 12:45 for 1:00 scheduled appointment, with Bayard Hugger ANP-C. At [place]

## 2018-11-11 NOTE — Telephone Encounter (Signed)
Tharon Aquas, PT, [facility] states that she has completed home health PT evaluation and is asking for verbal orders 1week1, 2week2, and 1week1.  Medical record reviewed. Social work note reviewed.  Verbal orders given per office protocol.

## 2018-11-12 ENCOUNTER — Encounter: Payer: Medicare Other | Admitting: Physician Assistant

## 2018-11-12 ENCOUNTER — Other Ambulatory Visit: Payer: Self-pay

## 2018-11-12 DIAGNOSIS — L97812 Non-pressure chronic ulcer of other part of right lower leg with fat layer exposed: Secondary | ICD-10-CM | POA: Diagnosis not present

## 2018-11-12 DIAGNOSIS — D469 Myelodysplastic syndrome, unspecified: Secondary | ICD-10-CM | POA: Diagnosis not present

## 2018-11-12 DIAGNOSIS — M313 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: Secondary | ICD-10-CM | POA: Diagnosis not present

## 2018-11-12 DIAGNOSIS — M1990 Unspecified osteoarthritis, unspecified site: Secondary | ICD-10-CM | POA: Diagnosis not present

## 2018-11-12 DIAGNOSIS — I480 Paroxysmal atrial fibrillation: Secondary | ICD-10-CM | POA: Diagnosis not present

## 2018-11-12 DIAGNOSIS — D638 Anemia in other chronic diseases classified elsewhere: Secondary | ICD-10-CM | POA: Diagnosis not present

## 2018-11-12 DIAGNOSIS — I10 Essential (primary) hypertension: Secondary | ICD-10-CM | POA: Diagnosis not present

## 2018-11-12 DIAGNOSIS — D508 Other iron deficiency anemias: Secondary | ICD-10-CM | POA: Diagnosis not present

## 2018-11-12 DIAGNOSIS — E11622 Type 2 diabetes mellitus with other skin ulcer: Secondary | ICD-10-CM | POA: Diagnosis not present

## 2018-11-12 DIAGNOSIS — M359 Systemic involvement of connective tissue, unspecified: Secondary | ICD-10-CM | POA: Diagnosis not present

## 2018-11-14 DIAGNOSIS — K5900 Constipation, unspecified: Secondary | ICD-10-CM | POA: Diagnosis not present

## 2018-11-14 DIAGNOSIS — Z6831 Body mass index (BMI) 31.0-31.9, adult: Secondary | ICD-10-CM | POA: Diagnosis not present

## 2018-11-14 DIAGNOSIS — E785 Hyperlipidemia, unspecified: Secondary | ICD-10-CM | POA: Diagnosis not present

## 2018-11-14 DIAGNOSIS — I87319 Chronic venous hypertension (idiopathic) with ulcer of unspecified lower extremity: Secondary | ICD-10-CM | POA: Diagnosis not present

## 2018-11-14 DIAGNOSIS — I6350 Cerebral infarction due to unspecified occlusion or stenosis of unspecified cerebral artery: Secondary | ICD-10-CM | POA: Diagnosis not present

## 2018-11-14 DIAGNOSIS — I480 Paroxysmal atrial fibrillation: Secondary | ICD-10-CM | POA: Diagnosis not present

## 2018-11-14 DIAGNOSIS — I8290 Acute embolism and thrombosis of unspecified vein: Secondary | ICD-10-CM | POA: Diagnosis not present

## 2018-11-14 DIAGNOSIS — I10 Essential (primary) hypertension: Secondary | ICD-10-CM | POA: Diagnosis not present

## 2018-11-14 DIAGNOSIS — M313 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: Secondary | ICD-10-CM | POA: Diagnosis not present

## 2018-11-14 NOTE — Progress Notes (Signed)
FALICITY, SHEETS ([id number]) Visit Report for 11/12/2018 Arrival Information Details Patient Name: [name], [name]. Date of Service: 11/12/2018 2:30 PM Medical Record Number: [id number] Patient Account Number: [id number] Date of Birth/Sex: [birth year] (68 y.o. F) Treating RN: Montey Hora Primary Care Stephie Xu: Crist Infante Other Clinician: Referring Tejay Hubert: Crist Infante Treating Chancelor Hardrick/Extender: Melburn Hake, HOYT Weeks in Treatment: 8 Visit Information History Since Last Visit Added or deleted any medications: Yes Patient Arrived: [place] Any new allergies or adverse reactions: No Arrival Time: 14:37 Had a fall or experienced change in No Accompanied By: self activities of daily living that may affect Transfer Assistance: None risk of falls: Patient Identification Verified: Yes Signs or symptoms of abuse/neglect since last visito No Secondary Verification Process Yes Hospitalized since last visit: Yes Completed: Implantable device outside of the clinic excluding No Patient Has Alerts: Yes cellular tissue based products placed in the center Patient Alerts: Patient on Blood since last visit: Thinner Has Dressing in Place as Prescribed: Yes Eliquis Pain Present Now: No Electronic Signature(s) Signed: 11/12/2018 4:07:26 PM By: Lorine Bears RCP, RRT, CHT Entered By: Lorine Bears on 11/12/2018 14:41:22 [name] ([id number]) -------------------------------------------------------------------------------- Lower Extremity Assessment Details Patient Name: [name], [name]. Date of Service: 11/12/2018 2:30 PM Medical Record Number: [id number] Patient Account Number: [id number] Date of Birth/Sex: 06/06/1951 (68 y.o. F) Treating RN: Army Melia Primary Care Symphanie Cederberg: Crist Infante Other Clinician: Referring Katrena Stehlin: Crist Infante Treating Latamara Melder/Extender: Melburn Hake, HOYT Weeks in Treatment: 8 Edema Assessment Assessed: [Left: No] [Right:  No] Edema: [Left: N] [Right: o] Vascular Assessment Pulses: Dorsalis Pedis Palpable: [Right:Yes] Extremity colors, hair growth, and conditions: Extremity Color: [Right:Normal] Hair Growth on Extremity: [Right:No] Temperature of Extremity: [Right:Warm < 3 seconds] Toe Nail Assessment Left: Right: Thick: No Discolored: No Deformed: No Improper Length and Hygiene: No Electronic Signature(s) Signed: 11/12/2018 4:18:28 PM By: Army Melia Entered By: Army Melia on 11/12/2018 14:51:22 [place], [place]. ([id number]) -------------------------------------------------------------------------------- Multi Wound Chart Details Patient Name: [name]. Date of Service: 11/12/2018 2:30 PM Medical Record Number: [id number] Patient Account Number: [id number] Date of Birth/Sex: April 05, 1951 (68 y.o. F) Treating RN: Montey Hora Primary Care Jaysean Manville: Crist Infante Other Clinician: Referring Fernand Sorbello: Crist Infante Treating Damaris Abeln/Extender: Melburn Hake, HOYT Weeks in Treatment: 8 Vital Signs Height(in): 63 Pulse(bpm): 60 Weight(lbs): 174 Blood Pressure(mmHg): 109/63 Body Mass Index(BMI): 31 Temperature(F): 98.6 Respiratory Rate 16 (breaths/min): Photos: [N/A:N/A] Wound Location: Right Lower Leg - Lateral N/A N/A Wounding Event: Bump N/A N/A Primary Etiology: Auto-immune N/A N/A Comorbid History: Arrhythmia, Deep Vein N/A N/A Thrombosis, Hypertension, Osteoarthritis, Received Chemotherapy Date Acquired: 08/01/2018 N/A N/A Weeks of Treatment: 8 N/A N/A Wound Status: Open N/A N/A Measurements L x W x D 1.9x1.5x0.2 N/A N/A (cm) Area (cm) : 2.238 N/A N/A Volume (cm) : 0.448 N/A N/A % Reduction in Area: 48.30% N/A N/A % Reduction in Volume: 79.30% N/A N/A Classification: Full Thickness Without N/A N/A Exposed Support Structures Exudate Amount: Medium N/A N/A Exudate Type: Purulent N/A N/A Exudate Color: yellow, brown, green N/A N/A Wound Margin: Flat and Intact N/A  N/A Granulation Amount: None Present (0%) N/A N/A Necrotic Amount: Large (67-100%) N/A N/A Necrotic Tissue: Eschar, Adherent Slough N/A N/A Exposed Structures: Fat Layer (Subcutaneous N/A N/A Tissue) Exposed: Yes Fascia: No Tendon: No [name], [name]. ([id number]) Muscle: No Joint: No Bone: No Epithelialization: Small (1-33%) N/A N/A Periwound Skin Texture: Excoriation: No N/A N/A Induration: No Callus: No Crepitus: No Rash: No Scarring: No Periwound Skin Moisture: Maceration: No N/A N/A Dry/Scaly:  No Periwound Skin Color: Atrophie Blanche: No N/A N/A Cyanosis: No Ecchymosis: No Erythema: No Hemosiderin Staining: No Mottled: No Pallor: No Rubor: No Temperature: No Abnormality N/A N/A Tenderness on Palpation: Yes N/A N/A Wound Preparation: Ulcer Cleansing: N/A N/A Rinsed/Irrigated with Saline, Other: soap and water Topical Anesthetic Applied: Other: lidocaine 4% Treatment Notes Electronic Signature(s) Signed: 11/12/2018 5:06:46 PM By: Montey Hora Entered By: Montey Hora on 11/12/2018 15:21:10 [name] ([id number]) -------------------------------------------------------------------------------- [place] Details Patient Name: [name], [name]. Date of Service: 11/12/2018 2:30 PM Medical Record Number: [id number] Patient Account Number: [id number] Date of Birth/Sex: 03-12-1951 (68 y.o. F) Treating RN: Montey Hora Primary Care Chrsitopher Wik: Crist Infante Other Clinician: Referring Loyda Costin: Crist Infante Treating Yarelie Hams/Extender: Melburn Hake, HOYT Weeks in Treatment: 8 Active Inactive Necrotic Tissue Nursing Diagnoses: Impaired tissue integrity related to necrotic/devitalized tissue Knowledge deficit related to management of necrotic/devitalized tissue Goals: Necrotic/devitalized tissue will be minimized in the wound bed Date Initiated: 09/12/2018 Target Resolution Date: 12/21/2018 Goal Status: Active Interventions: Assess patient pain  level pre-, during and post procedure and prior to discharge Provide education on necrotic tissue and debridement process Treatment Activities: Apply topical anesthetic as ordered : 09/12/2018 Notes: Orientation to the Wound Care Program Nursing Diagnoses: Knowledge deficit related to the wound healing center program Goals: Patient/caregiver will verbalize understanding of the [place] Date Initiated: 09/12/2018 Target Resolution Date: 12/21/2018 Goal Status: Active Interventions: Provide education on orientation to the wound center Notes: Pain, Acute or Chronic Nursing Diagnoses: Pain Management - Non-cyclic Acute (Procedural) Goals: Patient will verbalize adequate pain control and receive pain control interventions during procedures as needed [name], [name] ([id number]) Date Initiated: 09/12/2018 Target Resolution Date: 12/21/2018 Goal Status: Active Interventions: Assess comfort goal upon admission Encourage patient to take pain medications as prescribed Treatment Activities: Administer pain control measures as ordered : 09/12/2018 Notes: Wound/Skin Impairment Nursing Diagnoses: Impaired tissue integrity Goals: Ulcer/skin breakdown will have a volume reduction of 30% by week 4 Date Initiated: 09/12/2018 Target Resolution Date: 12/21/2018 Goal Status: Active Interventions: Assess patient/caregiver ability to obtain necessary supplies Assess patient/caregiver ability to perform ulcer/skin care regimen upon admission and as needed Assess ulceration(s) every visit Treatment Activities: Referred to DME Ziare Cryder for dressing supplies : 09/12/2018 Skin care regimen initiated : 09/12/2018 Notes: Electronic Signature(s) Signed: 11/12/2018 5:06:46 PM By: Montey Hora Entered By: Montey Hora on 11/12/2018 15:21:03 [name] ([id number]) -------------------------------------------------------------------------------- Pain Assessment Details Patient Name:  [name], [name]. Date of Service: 11/12/2018 2:30 PM Medical Record Number: [id number] Patient Account Number: [id number] Date of Birth/Sex: Mar 20, 1951 (68 y.o. F) Treating RN: Montey Hora Primary Care Sayer Masini: Crist Infante Other Clinician: Referring Hernandez Losasso: Crist Infante Treating Vasil Juhasz/Extender: Melburn Hake, HOYT Weeks in Treatment: 8 Active Problems Location of Pain Severity and Description of Pain Patient Has Paino No Site Locations Pain Management and Medication Current Pain Management: Electronic Signature(s) Signed: 11/12/2018 4:07:26 PM By: Paulla Fore, RRT, CHT Signed: 11/12/2018 5:06:46 PM By: Montey Hora Entered By: Lorine Bears on 11/12/2018 14:42:10 [name] ([id number]) -------------------------------------------------------------------------------- Patient/Caregiver Education Details Patient Name: [name], [name]. Date of Service: 11/12/2018 2:30 PM Medical Record Number: [id number] Patient Account Number: [id number] Date of Birth/Gender: 1951-07-23 (67 y.o. F) Treating RN: Montey Hora Primary Care Physician: Crist Infante Other Clinician: Referring Physician: Crist Infante Treating Physician/Extender: Sharalyn Ink in Treatment: 8 Education Assessment Education Provided To: Patient Education Topics Provided Venous: Handouts: Other: CONTINUED NEED FOR COMPRESSION Methods: Explain/Verbal Responses: State content correctly Electronic  Signature(s) Signed: 11/12/2018 5:06:46 PM By: Montey Hora Entered By: Montey Hora on 11/12/2018 15:34:48 [name] ([id number]) -------------------------------------------------------------------------------- Wound Assessment Details Patient Name: [name], [name]. Date of Service: 11/12/2018 2:30 PM Medical Record Number: [id number] Patient Account Number: [id number] Date of Birth/Sex: [birth year] (68 y.o. F) Treating RN: Army Melia Primary Care Aubri Gathright: Crist Infante Other Clinician: Referring Aireal Slater: Crist Infante Treating Laylonie Marzec/Extender: Melburn Hake, HOYT Weeks in Treatment: 8 Wound Status Wound Number: 1 Primary Auto-immune Etiology: Wound Location: Right Lower Leg - Lateral Wound Open Wounding Event: Bump Status: Date Acquired: 08/01/2018 Comorbid Arrhythmia, Deep Vein Thrombosis, Weeks Of Treatment: 8 History: Hypertension, Osteoarthritis, Received Clustered Wound: No Chemotherapy Photos Photo Uploaded By: Army Melia on 11/12/2018 14:58:26 Wound Measurements Length: (cm) 1.9 Width: (cm) 1.5 Depth: (cm) 0.2 Area: (cm) 2.238 Volume: (cm) 0.448 % Reduction in Area: 48.3% % Reduction in Volume: 79.3% Epithelialization: Small (1-33%) Tunneling: No Undermining: No Wound Description Full Thickness Without Exposed Support Foul Od Classification: Structures Slough/ Wound Margin: Flat and Intact Exudate Medium Amount: Exudate Type: Purulent Exudate Color: yellow, brown, green or After Cleansing: No Fibrino Yes Wound Bed Granulation Amount: None Present (0%) Exposed Structure Necrotic Amount: Large (67-100%) Fascia Exposed: No Necrotic Quality: Eschar, Adherent Slough Fat Layer (Subcutaneous Tissue) Exposed: Yes Tendon Exposed: No Muscle Exposed: No Joint Exposed: No Bone Exposed: No [name], Joyce J. ([id number]) Periwound Skin Texture Texture Color No Abnormalities Noted: No No Abnormalities Noted: No Callus: No Atrophie Blanche: No Crepitus: No Cyanosis: No Excoriation: No Ecchymosis: No Induration: No Erythema: No Rash: No Hemosiderin Staining: No Scarring: No Mottled: No Pallor: No Moisture Rubor: No No Abnormalities Noted: No Dry / Scaly: No Temperature / Pain Maceration: No Temperature: No Abnormality Tenderness on Palpation: Yes Wound Preparation Ulcer Cleansing: Rinsed/Irrigated with Saline, Other: soap and water, Topical Anesthetic Applied: Other: lidocaine 4%, Electronic  Signature(s) Signed: 11/12/2018 4:18:28 PM By: Army Melia Entered By: Army Melia on 11/12/2018 14:50:54 [name] ([id number]) -------------------------------------------------------------------------------- Vit[place] Details Patient Name: [name] Date of Service: 11/12/2018 2:30 PM Medical Record Number: [id number] Patient Account Number: [id number] Date of Birth/Sex: Dec 03, 1950 (68 y.o. F) Treating RN: Montey Hora Primary Care Jasten Guyette: Crist Infante Other Clinician: Referring Frances Ambrosino: Crist Infante Treating Siraj Dermody/Extender: Melburn Hake, HOYT Weeks in Treatment: 8 Vital Signs Time Taken: 14:42 Temperature (F): 98.6 Height (in): 63 Pulse (bpm): 60 Weight (lbs): 174 Respiratory Rate (breaths/min): 16 Body Mass Index (BMI): 30.8 Blood Pressure (mmHg): 109/63 Reference Range: 80 - 120 mg / dl Electronic Signature(s) Signed: 11/12/2018 4:07:26 PM By: Lorine Bears RCP, RRT, CHT Entered By: Lorine Bears on 11/12/2018 14:45:45

## 2018-11-14 NOTE — Progress Notes (Signed)
Joyce, Kaufman ([id number]) Visit Report for 11/12/2018 Chief Complaint Document Details Patient Name: [name], [name]. Date of Service: 11/12/2018 2:30 PM Medical Record Number: [id number] Patient Account Number: [id number] Date of Birth/Sex: 14-Mar-1951 (68 y.o. F) Treating RN: Montey Hora Primary Care Provider: Crist Infante Other Clinician: Referring Provider: Crist Infante Treating Provider/Extender: Melburn Hake, HOYT Weeks in Treatment: 8 Information Obtained from: Patient Chief Complaint Right LE Ulcer Electronic Signature(s) Signed: 11/13/2018 11:01:46 PM By: Worthy Keeler PA-C Entered By: Worthy Keeler on 11/12/2018 14:21:33 [name] ([id number]) -------------------------------------------------------------------------------- Cellular or Tissue Based Product Details Patient Name: [name], [name]. Date of Service: 11/12/2018 2:30 PM Medical Record Number: [id number] Patient Account Number: [id number] Date of Birth/Sex: [birth year] (68 y.o. F) Treating RN: Montey Hora Primary Care Provider: Crist Infante Other Clinician: Referring Provider: Crist Infante Treating Provider/Extender: Melburn Hake, HOYT Weeks in Treatment: 8 Cellular or Tissue Based Wound #1 Right,Lateral Lower Leg Product Type Applied to: Performed By: Physician STONE III, HOYT E., PA-C Cellular or Tissue Based Epifix Product Type: Level of Consciousness (Pre- Awake and Alert procedure): Pre-procedure Verification/Time Yes - 15:31 Out Taken: Location: trunk / arms / legs Wound Size (sq cm): 2.85 Product Size (sq cm): 6 Waste Size (sq cm): 0 Amount of Product Applied (sq cm): 6 Instrument Used: Forceps, Scissors Lot #: [id number] Expiration Date: 07/22/2023 Fenestrated: No Reconstituted: Yes Solution Type: normal saline Solution Amount: 5 ML Lot #: [id number] Solution Expiration Date: 07/21/2020 Secured: Yes Secured With: mepitel one Dressing Applied: Yes Primary Dressing: mepitel  one Procedural Pain: 0 Post Procedural Pain: 0 Response to Treatment: Procedure was tolerated well Level of Consciousness (Post- Awake and Alert procedure): Post Procedure Diagnosis Same as Pre-procedure Electronic Signature(s) Signed: 11/12/2018 5:06:46 PM By: Montey Hora Entered By: Montey Hora on 11/12/2018 15:32:50 [name] ([id number]) -------------------------------------------------------------------------------- Debridement Details Patient Name: [name]. Date of Service: 11/12/2018 2:30 PM Medical Record Number: [id number] Patient Account Number: [id number] Date of Birth/Sex: [birth year] (68 y.o. F) Treating RN: Montey Hora Primary Care Provider: Crist Infante Other Clinician: Referring Provider: Crist Infante Treating Provider/Extender: Melburn Hake, HOYT Weeks in Treatment: 8 Debridement Performed for Wound #1 Right,Lateral Lower Leg Assessment: Performed By: Physician STONE III, HOYT E., PA-C Debridement Type: Debridement Level of Consciousness (Pre- Awake and Alert procedure): Pre-procedure Verification/Time Yes - 15:22 Out Taken: Start Time: 15:22 Pain Control: Lidocaine 4% Topical Solution Total Area Debrided (L x W): 1.9 (cm) x 1.5 (cm) = 2.85 (cm) Tissue and other material Viable, Non-Viable, Slough, Subcutaneous, Slough debrided: Level: Skin/Subcutaneous Tissue Debridement Description: Excisional Instrument: Curette Bleeding: Minimum Hemostasis Achieved: Pressure End Time: 15:25 Procedural Pain: 0 Post Procedural Pain: 0 Response to Treatment: Procedure was tolerated well Level of Consciousness Awake and Alert (Post-procedure): Post Debridement Measurements of Total Wound Length: (cm) 1.9 Width: (cm) 1.5 Depth: (cm) 0.3 Volume: (cm) 0.672 Character of Wound/Ulcer Post Debridement: Improved Post Procedure Diagnosis Same as Pre-procedure Electronic Signature(s) Signed: 11/12/2018 5:06:46 PM By: Montey Hora Signed:  11/13/2018 11:01:46 PM By: Worthy Keeler PA-C Entered By: Montey Hora on 11/12/2018 15:29:39 [name] ([id number]) -------------------------------------------------------------------------------- HPI Details Patient Name: [name] Date of Service: 11/12/2018 2:30 PM Medical Record Number: [id number] Patient Account Number: [id number] Date of Birth/Sex: July 01, 1951 (68 y.o. 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 She states that she often has areas like this that will come up but normally not to the severity. She's typically able to take care of these herself. However she's been having a lot of trouble since this past summer in particular. 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 She was most recently treated with this by her primary care provider. Fortunately that seems to have cleared up any infection that she may have had. Nonetheless other pertinent medical history includes the chronic long-term use of Eliquis secondary to atrial fibrillation. Her ABI appear to be okay today. She did have a DVT 13 years ago and this lag but has not had anything recently. The Eliquis seems to be beneficial in this regard. Otherwise there's no evidence of active infection at this time. No fevers, chills, nausea, or vomiting noted at this time. 09/19/18 on evaluation today patient presents for follow-up concerning her ongoing lower extremity wound. This is her second visit here in our clinic. She does have slough covering the surface of the wound today. Fortunately there is no signs  of infection at this time. Overall I have been pleased with the interval change although she still has a lot of Slough I do feel like the Iodoflex was of benefit for her. We did have to change her wrap once in the weeks since I last saw her fortunately after that changed nothing seem to smell or drain through. 09/27/18 on evaluation today patient appears to be doing better in regard to her right lateral lower extremity ulcer. She's been tolerating the dressing changes without complication. Fortunately there is no sign of infection at this time. With that being said we have had to change the wrap at least once in the interim between when we see her and when she comes back in for evaluation due to the amount of drainage an odor. With that being said she is getting very frustrated with the length of time is taken get this to heal. Fortunately there is no evidence of anything worsening and a lot of the slough is improving she has good granulation seems to be peeking out from underneath. She is still having discomfort unfortunately. 10/03/18 on evaluation today patient's wound actually appears to be doing better from the standpoint of feeling in. With that being said she actually does have some slough covering the surface of the wound unfortunately. This is going to require sharp debridement prior to application of EpiFix. 10/15/18 on evaluation today patient actually appears to be doing much better in regard to her wound. I do believe that the EpiFix has been of benefit for her which is excellent news. Overall I think that this is something I would definitely recommend continuing I've seen definite progress. 10/22/18 on evaluation today patient actually appears to be doing very well in regard to her leg ulcer. This is shown signs of excellent improvement measurement wise she is continuing to week by week make great progress in my pinion. Overall very pleased in this regard. Fortunately there's no signs of  infection at this time. I do believe that the EpiFix has been beneficial for her. 10/29/18 on evaluation today patient actually appears to be doing very well today. The EpiFix does seem to be benefiting her as far as the overall improvement we have seen is concerned. Fortunately there's no signs of infection at  this time and again her pain is much less than what it used to be. Overall I feel like she's making excellent progress. 11/12/18 on evaluation today patient presents after unfortunately having had a stroke since the last time I seen her until today. She tells me that she's actually doing very well in this regard she did have some rehab in the hospital and then subsequently was discharged to home. She is home at this point. Nonetheless she has physical therapy a potential occupational therapy is gonna be coming out to see her. Nonetheless she tells me that she is not having any evidence at this time of active infection which is good news she's not having any evidence of pain either in regard to the right lateral lower extremity. Overall I feel like she has done well as far as I'm concerned in the EpiFix which did stay in place for longer than the week obviously I think still have good effect based on what I'm seeing at this point the wound is significantly smaller. DYLANA, SHAW ([id number]) Electronic Signature(s) Signed: 11/13/2018 11:01:46 PM By: Worthy Keeler PA-C Entered By: Worthy Keeler on 11/13/2018 22:45:49 MADISEN, LUDVIGSEN ([id number]) -------------------------------------------------------------------------------- Physical Exam Details Patient Name: [name], [name] Date of Service: 11/12/2018 2:30 PM Medical Record Number: [id number] Patient Account Number: [id number] Date of Birth/Sex: [birth year] (68 y.o. F) Treating RN: Montey Hora Primary Care Provider: Crist Infante Other Clinician: Referring Provider: Crist Infante Treating Provider/Extender: STONE III, HOYT Weeks in  Treatment: 8 Constitutional Well-nourished and well-hydrated in no acute distress. Respiratory normal breathing without difficulty. clear to auscultation bilaterally. Cardiovascular regular rate and rhythm with normal S1, S2. Psychiatric this patient is able to make decisions and demonstrates good insight into disease process. Alert and Oriented x 3. pleasant and cooperative. Notes Patient's wound did have some Slough/residual EpiFix remaining on the surface of the wound which was sharply debride away today she tolerated this without complication post debridement the wound bed appears to be doing much better I do think it's appropriate for [place] to consider continuing with the next EpiFix today. The patient is in agreement that plan. Therefore did go ahead and apply EpiFix to the surface of the wound which was secured with mepitel no Steri-Strips. We then we go ahead and wrap the legs like we have been doing up to this point. Electronic Signature(s) Signed: 11/13/2018 11:01:46 PM By: Worthy Keeler PA-C Entered By: Worthy Keeler on 11/13/2018 22:46:50 [name] ([id number]) -------------------------------------------------------------------------------- Physician Orders Details Patient Name: [name], [name]. Date of Service: 11/12/2018 2:30 PM Medical Record Number: [id number] Patient Account Number: [id number] Date of Birth/Sex: Jan 26, 1951 (68 y.o. 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 o May shower with protection. - Please do not get your wrap wet Anesthetic (add to Medication List) Wound #1 Right,Lateral Lower Leg o Topical Lidocaine 4% cream applied to wound bed prior to debridement (In Clinic Only). o Benzocaine Topical Anesthetic Spray applied to wound bed prior to debridement (In Clinic Only). Primary Wound Dressing Wound #  1 Right,Lateral Lower Leg o Other: - Epifix Secondary Dressing Wound #1 Right,Lateral Lower Leg o ABD pad Dressing Change Frequency Wound #1 Right,Lateral Lower Leg o Change dressing every week o Other: - if needed Follow-up Appointments Wound #1 Right,Lateral Lower Leg o Return Appointment in 1 week. Edema Control Wound #1 Right,Lateral Lower Leg o 3 Layer Compression System - Right Lower Extremity - unna to anchor - use Kerlix rather than cotton layer o Elevate legs to the level of the heart and pump ankles as often as possible Croak, [name] ([id number]) Advanced Therapies Wound #1 Right,Lateral Lower Leg o EpiFix application in clinic; including contact layer, fixation with steri strips, dry gauze and cover dressing. Electronic Signature(s) Signed: 11/12/2018 5:06:46 PM By: Montey Hora Signed: 11/13/2018 11:01:46 PM By: Worthy Keeler PA-C Entered By: Montey Hora on 11/12/2018 [place], [place] ([id number]) -------------------------------------------------------------------------------- Problem List Details Patient Name: JAYLEAH, GARBERS. Date of Service: 11/12/2018 2:30 PM Medical Record Number: [id number] Patient Account Number: [id number] Date of Birth/Sex: 04-10-1951 (68 y.o. 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 09/12/2018 No Yes L97.812 Non-pressure chronic ulcer of other part of right lower leg 09/12/2018 No Yes with fat layer exposed I48.0 Paroxysmal atrial fibrillation 09/12/2018 No Yes I10 Essential (primary) hypertension 09/12/2018 No Yes Z79.01 Long term (current) use of anticoagulants 09/12/2018 No Yes Inactive Problems Resolved Problems Electronic Signature(s) Signed: 11/13/2018 11:01:46 PM By: Worthy Keeler PA-C Entered By: Worthy Keeler on 11/12/2018 14:21:29 [name] ([id number]) -------------------------------------------------------------------------------- Progress Note Details Patient Name: [name] Date of Service: 11/12/2018 2:30 PM Medical Record Number: [id number] Patient Account Number: [id number] Date of Birth/Sex: [birth year] (68 y.o. 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 She states that she often has areas like this that will come up but normally not to the severity. She's typically able to take care of these herself. However she's been having a lot of trouble since this past summer in particular. 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 She was most recently treated with this by her primary care provider. Fortunately that seems to have cleared up any infection that she may have had. Nonetheless other pertinent medical history includes the chronic long-term use of Eliquis secondary to  atrial fibrillation. Her ABI appear to be okay today. She did have a DVT 13 years ago and this lag but has not had anything recently. The Eliquis seems to be beneficial in this regard. Otherwise there's no evidence of active infection at this time. No fevers, chills, nausea, or vomiting noted at this time. 09/19/18 on evaluation today patient presents for follow-up concerning her ongoing lower extremity wound. This is her second visit here in our clinic. She does have slough covering the surface of the wound today. Fortunately there is no signs of infection at this time. Overall I have been pleased with the interval change although she still has  a lot of Slough I do feel like the Iodoflex was of benefit for her. We did have to change her wrap once in the weeks since I last saw her fortunately after that changed nothing seem to smell or drain through. 09/27/18 on evaluation today patient appears to be doing better in regard to her right lateral lower extremity ulcer. She's been tolerating the dressing changes without complication. Fortunately there is no sign of infection at this time. With that being said we have had to change the wrap at least once in the interim between when we see her and when she comes back in for evaluation due to the amount of drainage an odor. With that being said she is getting very frustrated with the length of time is taken get this to heal. Fortunately there is no evidence of anything worsening and a lot of the slough is improving she has good granulation seems to be peeking out from underneath. She is still having discomfort unfortunately. 10/03/18 on evaluation today patient's wound actually appears to be doing better from the standpoint of feeling in. With that being said she actually does have some slough covering the surface of the wound unfortunately. This is going to require sharp debridement prior to application of EpiFix. 10/15/18 on evaluation today patient actually  appears to be doing much better in regard to her wound. I do believe that the EpiFix has been of benefit for her which is excellent news. Overall I think that this is something I would definitely recommend continuing I've seen definite progress. 10/22/18 on evaluation today patient actually appears to be doing very well in regard to her leg ulcer. This is shown signs of excellent improvement measurement wise she is continuing to week by week make great progress in my pinion. Overall very pleased in this regard. Fortunately there's no signs of infection at this time. I do believe that the EpiFix has been beneficial for her. 10/29/18 on evaluation today patient actually appears to be doing very well today. The EpiFix does seem to be benefiting her as far as the overall improvement we have seen is concerned. Fortunately there's no signs of infection at this time and again her pain is much less than what it used to be. Overall I feel like she's making excellent progress. 11/12/18 on evaluation today patient presents after unfortunately having had a stroke since the last time I seen her until today. MALKY, RUDZINSKI ([id number]) She tells me that she's actually doing very well in this regard she did have some rehab in the hospital and then subsequently was discharged to home. She is home at this point. Nonetheless she has physical therapy a potential occupational therapy is gonna be coming out to see her. Nonetheless she tells me that she is not having any evidence at this time of active infection which is good news she's not having any evidence of pain either in regard to the right lateral lower extremity. Overall I feel like she has done well as far as I'm concerned in the EpiFix which did stay in place for longer than the week obviously I think still have good effect based on what I'm seeing at this point the wound is significantly smaller. Patient History Information obtained from Patient. Family  History Cancer - Father, Diabetes - Maternal Grandparents, Lung Disease - Father, Stroke - Father, No family history of Heart Disease, Hereditary Spherocytosis, Hypertension, Kidney Disease, Seizures, Thyroid Problems, Tuberculosis. Social History Never smoker, Marital Status - Married, Alcohol  Use - Never, Drug Use - No History, Caffeine Use - Daily. Medical History Eyes Denies history of Cataracts, Glaucoma, Optic Neuritis Ear/Nose/Mouth/Throat Denies history of Chronic sinus problems/congestion, Middle ear problems Hematologic/Lymphatic Denies history of Anemia, Hemophilia, Human Immunodeficiency Virus, Sickle Cell Disease Respiratory Denies history of Aspiration, Asthma, Chronic Obstructive Pulmonary Disease (COPD), Pneumothorax, Sleep Apnea, Tuberculosis Cardiovascular Patient has history of Arrhythmia - a fib, Deep Vein Thrombosis - 13 years ago, Hypertension Denies history of Angina, Congestive Heart Failure, Coronary Artery Disease, Hypotension, Myocardial Infarction, Peripheral Arterial Disease, Peripheral Venous Disease, Phlebitis, Vasculitis Gastrointestinal Denies history of Cirrhosis , Colitis, Crohn s, Hepatitis A, Hepatitis B, Hepatitis C Endocrine Denies history of Type I Diabetes, Type II Diabetes Genitourinary Denies history of End Stage Renal Disease Immunological Denies history of Lupus Erythematosus, Raynaud s, Scleroderma Integumentary (Skin) Denies history of History of Burn, History of pressure wounds Musculoskeletal Patient has history of Osteoarthritis Denies history of Gout, Rheumatoid Arthritis, Osteomyelitis Neurologic Denies history of Dementia, Neuropathy, Quadriplegia, Paraplegia, Seizure Disorder Oncologic Patient has history of Received Chemotherapy - past for Wegeners Denies history of Received Radiation Psychiatric Denies history of Anorexia/bulimia, Confinement Anxiety Medical And Surgical History  Notes Immunological Wegeners Musculoskeletal OP CONSTANCIA, GEETING ([id number]) Review of Systems (ROS) Constitutional Symptoms (General Health) Denies complaints or symptoms of Fever, Chills. Respiratory The patient has no complaints or symptoms. Cardiovascular The patient has no complaints or symptoms. Psychiatric The patient has no complaints or symptoms. Objective Constitutional Well-nourished and well-hydrated in no acute distress. Vitals Time Taken: 2:42 PM, Height: 63 in, Weight: 174 lbs, BMI: 30.8, Temperature: 98.6 F, Pulse: 60 bpm, Respiratory Rate: 16 breaths/min, Blood Pressure: 109/63 mmHg. Respiratory normal breathing without difficulty. clear to auscultation bilaterally. Cardiovascular regular rate and rhythm with normal S1, S2. Psychiatric this patient is able to make decisions and demonstrates good insight into disease process. Alert and Oriented x 3. pleasant and cooperative. General Notes: Patient's wound did have some Slough/residual EpiFix remaining on the surface of the wound which was sharply debride away today she tolerated this without complication post debridement the wound bed appears to be doing much better I do think it's appropriate for [place] to consider continuing with the next EpiFix today. The patient is in agreement that plan. Therefore did go ahead and apply EpiFix to the surface of the wound which was secured with mepitel no Steri-Strips. We then we go ahead and wrap the legs like we have been doing up to this point. Integumentary (Hair, Skin) Wound #1 status is Open. Original cause of wound was Bump. The wound is located on the Right,Lateral Lower Leg. The wound measures 1.9cm length x 1.5cm width x 0.2cm depth; 2.238cm^2 area and 0.448cm^3 volume. There is Fat Layer (Subcutaneous Tissue) Exposed exposed. There is no tunneling or undermining noted. There is a medium amount of purulent drainage noted. The wound margin is flat and intact. There is no  granulation within the wound bed. There is a large (67-100%) amount of necrotic tissue within the wound bed including Eschar and Adherent Slough. The periwound skin appearance did not exhibit: Callus, Crepitus, Excoriation, Induration, Rash, Scarring, Dry/Scaly, Maceration, Atrophie Blanche, Cyanosis, Ecchymosis, Hemosiderin Staining, Mottled, Pallor, Rubor, Erythema. Periwound temperature was noted as No Abnormality. The periwound has tenderness on palpation. 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 There was a Excisional Skin/Subcutaneous Tissue Debridement with a total area of 2.85 sq cm performed by STONE III, HOYT E., PA-C. With the following instrument(s): Curette to remove Viable and Non-Viable tissue/material. Material removed includes Subcutaneous Tissue and Slough and after achieving pain control using Lidocaine 4% Topical Solution. No specimens were taken. A time out was conducted at 15:22, prior to the start of the procedure. A Minimum amount of bleeding was controlled with Pressure. The procedure was tolerated well with a pain level of 0 throughout and a pain level of 0 following the procedure. Post Debridement Measurements: 1.9cm length x 1.5cm width x 0.3cm depth; 0.672cm^3 volume. Character of Wound/Ulcer Post Debridement is improved. 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 A skin graft procedure using a bioengineered skin substitute/cellular or tissue based product was performed by STONE III,  HOYT E., PA-C with the following instrument(s): Forceps and Scissors. Epifix was applied and secured with mepitel one. 6 sq cm of product was utilized and 0 sq cm was wasted. Post Application, mepitel one was applied. A Time Out was conducted at 15:31, prior to the start of the procedure. The procedure was tolerated well with a pain level of 0 throughout and a pain level of 0 following the procedure. Post procedure Diagnosis Wound #1: Same as Pre-Procedure . Plan Wound Cleansing: Wound #1 Right,Lateral Lower Leg: Clean wound with Normal Saline. May shower with protection. - Please do not get your wrap wet Anesthetic (add to Medication List): Wound #1 Right,Lateral Lower Leg: Topical Lidocaine 4% cream applied to wound bed prior to debridement (In Clinic Only). Benzocaine Topical Anesthetic Spray applied to wound bed prior to debridement (In Clinic Only). Primary Wound Dressing: Wound #1 Right,Lateral Lower Leg: Other: - Epifix Secondary Dressing: Wound #1 Right,Lateral Lower Leg: NEYSA, ARTS ([id number]) ABD pad Dressing Change Frequency: Wound #1 Right,Lateral Lower Leg: Change dressing every week Other: - if needed Follow-up Appointments: Wound #1 Right,Lateral Lower Leg: Return Appointment in 1 week. Edema Control: Wound #1 Right,Lateral Lower Leg: 3 Layer Compression System - Right Lower Extremity - unna to anchor - use Kerlix rather than cotton layer Elevate legs to the level of the heart and pump ankles as often as possible Advanced Therapies: Wound #1 Right,Lateral Lower Leg: EpiFix application in clinic; including contact layer, fixation with steri strips, dry gauze and cover dressing. I'm gonna suggest that we continue with the above wound care measures for the next week the patient is in agreement with plan. We will subsequently see were things stand at follow-up. If anything changes or worsens in the meantime shall contact the office let me know otherwise my hope  is that this will continue to improve as it has up to this point we will drop down the size of the EpiFix as it doesn't look like she's gonna need as big a piece next week due to the progress of the wound. Please see above for specific wound care orders. We will see patient for re-evaluation in 1 week(s) here in the clinic. If anything worsens or changes patient will contact our office for additional recommendations. Electronic Signature(s) Signed: 11/13/2018 11:01:46 PM By: Worthy Keeler PA-C Entered By: Worthy Keeler on 11/13/2018 22:47:03 [name] ([id number]) -------------------------------------------------------------------------------- ROS/PFSH Details Patient Name: [name], [name]. Date of Service: 11/12/2018  2:30 PM Medical Record Number: [id number] Patient Account Number: [id number] Date of Birth/Sex: 1951-03-29 (68 y.o. F) Treating RN: Montey Hora Primary Care Provider: Crist Infante Other Clinician: Referring Provider: Crist Infante Treating Provider/Extender: Melburn Hake, HOYT Weeks in Treatment: 8 Information Obtained From Patient Wound History Do you currently have one or more open woundso Yes How many open wounds do you currently haveo 1 Approximately how long have you had your woundso 6 weeks How have you been treating your wound(s) until nowo unna boots Has your wound(s) ever healed and then re-openedo No Have you had any lab work done in the past montho No Have you tested positive for an antibiotic resistant organism (MRSA, VRE)o No Have you tested positive for osteomyelitis (bone infection)o No Have you had any tests for circulation on your legso Yes Who ordered the testo [facility] Where was the test doneo 2015 Constitutional Symptoms (General Health) Complaints and Symptoms: Negative for: Fever; Chills Eyes Medical History: Negative for: Cataracts; Glaucoma; Optic Neuritis Ear/Nose/Mouth/Throat Medical History: Negative for: Chronic sinus  problems/congestion; Middle ear problems Hematologic/Lymphatic Medical History: Negative for: Anemia; Hemophilia; Human Immunodeficiency Virus; Sickle Cell Disease Respiratory Complaints and Symptoms: No Complaints or Symptoms Medical History: Negative for: Aspiration; Asthma; Chronic Obstructive Pulmonary Disease (COPD); Pneumothorax; Sleep Apnea; Tuberculosis Cardiovascular Complaints and Symptoms: No Complaints or Symptoms [name], [name] ([id number]) Medical History: Positive for: Arrhythmia - a fib; Deep Vein Thrombosis - 13 years ago; Hypertension Negative for: Angina; Congestive Heart Failure; Coronary Artery Disease; Hypotension; Myocardial Infarction; Peripheral Arterial Disease; Peripheral Venous Disease; Phlebitis; Vasculitis Gastrointestinal Medical History: Negative for: Cirrhosis ; Colitis; Crohnos; Hepatitis A; Hepatitis B; Hepatitis C Endocrine Medical History: Negative for: Type I Diabetes; Type II Diabetes Genitourinary Medical History: Negative for: End Stage Renal Disease Immunological Medical History: Negative for: Lupus Erythematosus; Raynaudos; Scleroderma Past Medical History Notes: Wegeners Integumentary (Skin) Medical History: Negative for: History of Burn; History of pressure wounds Musculoskeletal Medical History: Positive for: Osteoarthritis Negative for: Gout; Rheumatoid Arthritis; Osteomyelitis Past Medical History Notes: OP Neurologic Medical History: Negative for: Dementia; Neuropathy; Quadriplegia; Paraplegia; Seizure Disorder Oncologic Medical History: Positive for: Received Chemotherapy - past for Wegeners Negative for: Received Radiation Psychiatric Complaints and Symptoms: No Complaints or Symptoms Medical History: Negative for: Anorexia/bulimia; Confinement Anxiety [name], [name] ([id number]) Immunizations Pneumococcal Vaccine: Received Pneumococcal Vaccination: Yes Implantable Devices No devices added Family and Social  History Cancer: Yes - Father; Diabetes: Yes - Maternal Grandparents; Heart Disease: No; Hereditary Spherocytosis: No; Hypertension: No; Kidney Disease: No; Lung Disease: Yes - Father; Seizures: No; Stroke: Yes - Father; Thyroid Problems: No; Tuberculosis: No; Never smoker; Marital Status - Married; Alcohol Use: Never; Drug Use: No History; Caffeine Use: Daily; Financial Concerns: No; Food, Clothing or Shelter Needs: No; Support System Lacking: No; Transportation Concerns: No; Advanced Directives: Yes (Not Provided); Patient does not want information on Advanced Directives; Medical Power of Attorney: Yes - sister - Retaj Hilbun (Not Provided) Physician Affirmation I have reviewed and agree with the above information. Electronic Signature(s) Signed: 11/13/2018 11:01:46 PM By: Worthy Keeler PA-C Signed: 11/14/2018 10:13:10 AM By: Montey Hora Entered By: Worthy Keeler on 11/13/2018 22:46:04 [name] ([id number]) -------------------------------------------------------------------------------- SuperBill Details Patient Name: [name], [name]. Date of Service: 11/12/2018 Medical Record Number: [id number] Patient Account Number: [id number] Date of Birth/Sex: 1951/01/12 (68 y.o. 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 I48.0 Paroxysmal atrial fibrillation I10 Essential (primary) hypertension Z79.01 Long term (current) use of anticoagulants Facility Procedures CPT4 Code Description: [id number] o Epifix o 2 x 3 Sq Cm Modifier: Quantity: 6 CPT4 Code Description: [id number] - SKIN SUB GRAFT TRNK/ARM/LEG ICD-10 Diagnosis Description M31.30  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 L97.812 Non-pressure chronic ulcer of other part of right lower leg wit Modifier: h fat layer expo Quantity: 1 sed Physician Procedures CPT4 Code Description: [id number] - WC PHYS SKIN SUB GRAFT TRNK/ARM/LEG ICD-10 Diagnosis Description 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

## 2018-11-15 DIAGNOSIS — G629 Polyneuropathy, unspecified: Secondary | ICD-10-CM | POA: Diagnosis not present

## 2018-11-15 DIAGNOSIS — I69351 Hemiplegia and hemiparesis following cerebral infarction affecting right dominant side: Secondary | ICD-10-CM | POA: Diagnosis not present

## 2018-11-15 DIAGNOSIS — Z86718 Personal history of other venous thrombosis and embolism: Secondary | ICD-10-CM | POA: Diagnosis not present

## 2018-11-15 DIAGNOSIS — I69328 Other speech and language deficits following cerebral infarction: Secondary | ICD-10-CM | POA: Diagnosis not present

## 2018-11-15 DIAGNOSIS — I10 Essential (primary) hypertension: Secondary | ICD-10-CM | POA: Diagnosis not present

## 2018-11-15 DIAGNOSIS — I69392 Facial weakness following cerebral infarction: Secondary | ICD-10-CM | POA: Diagnosis not present

## 2018-11-18 ENCOUNTER — Other Ambulatory Visit
Admission: RE | Admit: 2018-11-18 | Discharge: 2018-11-18 | Disposition: A | Discharge status: Home / Self Care | Payer: Medicare Other | Source: Ambulatory Visit | Attending: Physician Assistant | Admitting: Physician Assistant

## 2018-11-18 ENCOUNTER — Other Ambulatory Visit: Payer: Self-pay

## 2018-11-18 ENCOUNTER — Encounter: Payer: Medicare Other | Admitting: Physician Assistant

## 2018-11-18 DIAGNOSIS — E11622 Type 2 diabetes mellitus with other skin ulcer: Secondary | ICD-10-CM | POA: Diagnosis not present

## 2018-11-18 DIAGNOSIS — M8589 Other specified disorders of bone density and structure, multiple sites: Secondary | ICD-10-CM | POA: Diagnosis not present

## 2018-11-18 DIAGNOSIS — M1990 Unspecified osteoarthritis, unspecified site: Secondary | ICD-10-CM | POA: Diagnosis not present

## 2018-11-18 DIAGNOSIS — I52 Other heart disorders in diseases classified elsewhere: Secondary | ICD-10-CM | POA: Diagnosis not present

## 2018-11-18 DIAGNOSIS — L97812 Non-pressure chronic ulcer of other part of right lower leg with fat layer exposed: Secondary | ICD-10-CM | POA: Diagnosis not present

## 2018-11-18 DIAGNOSIS — I480 Paroxysmal atrial fibrillation: Secondary | ICD-10-CM | POA: Diagnosis not present

## 2018-11-18 DIAGNOSIS — M150 Primary generalized (osteo)arthritis: Secondary | ICD-10-CM | POA: Diagnosis not present

## 2018-11-18 DIAGNOSIS — D638 Anemia in other chronic diseases classified elsewhere: Secondary | ICD-10-CM | POA: Diagnosis not present

## 2018-11-18 DIAGNOSIS — D471 Chronic myeloproliferative disease: Secondary | ICD-10-CM | POA: Diagnosis not present

## 2018-11-18 DIAGNOSIS — K219 Gastro-esophageal reflux disease without esophagitis: Secondary | ICD-10-CM | POA: Diagnosis not present

## 2018-11-18 DIAGNOSIS — D473 Essential (hemorrhagic) thrombocythemia: Secondary | ICD-10-CM | POA: Diagnosis not present

## 2018-11-18 DIAGNOSIS — I872 Venous insufficiency (chronic) (peripheral): Secondary | ICD-10-CM | POA: Diagnosis not present

## 2018-11-18 DIAGNOSIS — D469 Myelodysplastic syndrome, unspecified: Secondary | ICD-10-CM | POA: Diagnosis not present

## 2018-11-18 DIAGNOSIS — D508 Other iron deficiency anemias: Secondary | ICD-10-CM | POA: Diagnosis not present

## 2018-11-18 DIAGNOSIS — Z79899 Other long term (current) drug therapy: Secondary | ICD-10-CM | POA: Diagnosis not present

## 2018-11-18 DIAGNOSIS — L03115 Cellulitis of right lower limb: Secondary | ICD-10-CM | POA: Insufficient documentation

## 2018-11-18 DIAGNOSIS — I639 Cerebral infarction, unspecified: Secondary | ICD-10-CM | POA: Diagnosis not present

## 2018-11-18 DIAGNOSIS — M313 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: Secondary | ICD-10-CM | POA: Diagnosis not present

## 2018-11-18 DIAGNOSIS — I10 Essential (primary) hypertension: Secondary | ICD-10-CM | POA: Diagnosis not present

## 2018-11-18 NOTE — Patient Outreach (Signed)
[facility]) Care Management  11/18/2018  [name] October 23, 1950 [id number]  EMMI: stroke red alert Referral date: 11/18/18 Referral reason: Sad / hopeless/ anxious/ empty Insurance: Medicare Day # 9  Attempt #1  Telephone call to patient regarding EMMI stroke red alert. Unable to reach patient. HIPAA compliant voice message left with call back phone number.    PLAN: RNCm will attempt 2nd telephone call to patient within 4 business days.  RNCM will send outreach letter to attempt contact  Quinn Plowman RN,BSN,CCM [facility] Telephonic  [phone]

## 2018-11-19 ENCOUNTER — Other Ambulatory Visit: Payer: Self-pay

## 2018-11-19 ENCOUNTER — Ambulatory Visit: Payer: Medicare Other | Admitting: Physician Assistant

## 2018-11-19 DIAGNOSIS — I69328 Other speech and language deficits following cerebral infarction: Secondary | ICD-10-CM | POA: Diagnosis not present

## 2018-11-19 DIAGNOSIS — I69351 Hemiplegia and hemiparesis following cerebral infarction affecting right dominant side: Secondary | ICD-10-CM | POA: Diagnosis not present

## 2018-11-19 DIAGNOSIS — I69392 Facial weakness following cerebral infarction: Secondary | ICD-10-CM | POA: Diagnosis not present

## 2018-11-19 DIAGNOSIS — G629 Polyneuropathy, unspecified: Secondary | ICD-10-CM | POA: Diagnosis not present

## 2018-11-19 DIAGNOSIS — I10 Essential (primary) hypertension: Secondary | ICD-10-CM | POA: Diagnosis not present

## 2018-11-19 DIAGNOSIS — Z86718 Personal history of other venous thrombosis and embolism: Secondary | ICD-10-CM | POA: Diagnosis not present

## 2018-11-19 NOTE — Patient Outreach (Signed)
[facility]) Care Management  11/19/2018  [name] [birth year] [id number]  EMMI: stroke red alert Referral date: 11/18/18 Referral reason: Sad / hopeless/ anxious/ empty Insurance: Medicare Day # 9  Telephone call to patient regarding EMMI  Stroke red alert. HIPAA verified with patient. Explained reason for call. Patient states she is not sad/ hopeless/ anxious/ or empty. She states she has seen her 2 of her doctors including her primary MD and she has been seen at the wound clinic.  Patient states she is experiencing open areas around her wound due to an adhesive being applied when she was in the hospital. Patient states she is sensitive to adhesive.   Patient states she has a wound to her right lower leg and she has been treated at the [facility] wound center for several weeks. Patient reports she is cleaning and redressing the wound area an applying compression stockings as advised. Patient reports continuing on her antibiotic medications. She states she is taking all other medications as prescribed.  Patient states she has been doing well from the stroke. She states she is receiving physical therapy 2 times per week  with [facility]. Patient states sees a rheumatologist who will be scheduling her for chemotherapy treatment due to her Wegener's granulomatosis.   Patient denies any further needs or concerns at this time.  RNCM discussed signs of bleeding with patient. Advised patient to call doctor for bleeding symptoms. Discussed signs/ symptoms of stroke with patient. Advised patient that 911 should be called for stroke like symptoms. Patient verbalized understanding.  RNCM advised patient to notify MD of any changes in condition prior to scheduled appointment. RNCM provided contact name and number:24 hour nurse advise line [phone].  RNCM verified patient aware of 911 services for urgent/ emergent needs.  PLAN: RNCM will close patient due to  patient being assessed and having no further needs.   Quinn Plowman RN,BSN,CCM [facility] Telephonic  [phone]

## 2018-11-19 NOTE — Progress Notes (Signed)
[name], [name] ([id number]) Visit Report for 11/18/2018 Arrival Information Details Patient Name: [name], [name]. Date of Service: 11/18/2018 12:30 PM Medical Record Number: [id number] Patient Account Number: [id number] Date of Birth/Sex: 07/21/1951 (68 y.o. F) Treating RN: [name] Primary Care [name]: [name] Other Clinician: Referring [name]: [name] Treating [name]/Extender: [name], [name]: 9 Visit Information History Since Last Visit Added or deleted any medications: No Patient Arrived: Walker Any new allergies or adverse reactions: No Arrival Time: 12:34 Had a fall or experienced change in No Accompanied By: self activities of daily living that may affect Transfer Assistance: None risk of falls: Patient Identification Verified: Yes Signs or symptoms of abuse/neglect since last visito No Secondary Verification Process Yes Hospitalized since last visit: No Completed: Implantable device outside of the clinic excluding No Patient Has Alerts: Yes cellular tissue based products placed in the center Patient Alerts: Patient on Blood since last visit: Thinner Has Dressing in Place as Prescribed: No Eliquis Pain Present Now: Yes Electronic Signature(s) Signed: 11/18/2018 4:10:47 PM By: [name], BSN, RN, CWS, [name] RN, BSN Entered By: [name], BSN, RN, CWS, [name] on 11/18/2018 12:37:25 [name] ([id number]) -------------------------------------------------------------------------------- Clinic Level of Care Assessment Details Patient Name: [name], [name]. Date of Service: 11/18/2018 12:30 PM Medical Record Number: [id number] Patient Account Number: [id number] Date of Birth/Sex: [birth year] (68 y.o. F) Treating RN: [name] Primary Care [name]: [name] Other Clinician: Referring [name]: [name] Treating [name]/Extender: [name], [name]: 9 Clinic Level of Care Assessment Items TOOL 4 Quantity Score []  -  Use when only an EandM is performed on FOLLOW-UP visit 0 ASSESSMENTS - Nursing Assessment / Reassessment X - Reassessment of Co-morbidities (includes updates in patient status) 1 10 X- 1 5 Reassessment of Adherence to Kaufman Plan ASSESSMENTS - Wound and Skin Assessment / Reassessment X - Simple Wound Assessment / Reassessment - one wound 1 5 []  - 0 Complex Wound Assessment / Reassessment - multiple wounds []  - 0 Dermatologic / Skin Assessment (not related to wound area) ASSESSMENTS - Focused Assessment []  - Circumferential Edema Measurements - multi extremities 0 []  - 0 Nutritional Assessment / Counseling / Intervention []  - 0 Lower Extremity Assessment (monofilament, tuning fork, pulses) []  - 0 Peripheral Arterial Disease Assessment (using hand held doppler) ASSESSMENTS - Ostomy and/or Continence Assessment and Care []  - Incontinence Assessment and Management 0 []  - 0 Ostomy Care Assessment and Management (repouching, etc.) PROCESS - Coordination of Care X - Simple Patient / Family Education for ongoing care 1 15 []  - 0 Complex (extensive) Patient / Family Education for ongoing care []  - 0 Staff obtains Programmer, systems, Records, Test Results / Process Orders []  - 0 Staff telephones HHA, Nursing Homes / Clarify orders / etc []  - 0 Routine Transfer to another Facility (non-emergent condition) []  - 0 Routine Hospital Admission (non-emergent condition) []  - 0 New Admissions / Biomedical engineer / Ordering NPWT, Apligraf, etc. []  - 0 Emergency Hospital Admission (emergent condition) X- 1 10 Simple Discharge Coordination [name], [name] ([id number]) []  - 0 Complex (extensive) Discharge Coordination PROCESS - Special Needs []  - Pediatric / Minor Patient Management 0 []  - 0 Isolation Patient Management []  - 0 Hearing / Language / Visual special needs []  - 0 Assessment of Community assistance (transportation, D/C planning, etc.) []  - 0 Additional assistance / Altered  mentation []  - 0 Support Surface(s) Assessment (bed, cushion, seat, etc.) INTERVENTIONS - Wound Cleansing / Measurement X - Simple Wound Cleansing -  one wound 1 5 []  - 0 Complex Wound Cleansing - multiple wounds X- 1 5 Wound Imaging (photographs - any number of wounds) []  - 0 Wound Tracing (instead of photographs) X- 1 5 Simple Wound Measurement - one wound []  - 0 Complex Wound Measurement - multiple wounds INTERVENTIONS - Wound Dressings X - Small Wound Dressing one or multiple wounds 1 10 []  - 0 Medium Wound Dressing one or multiple wounds []  - 0 Large Wound Dressing one or multiple wounds X- 1 5 Application of Medications - topical []  - 0 Application of Medications - injection INTERVENTIONS - Miscellaneous []  - External ear exam 0 X- 1 5 Specimen Collection (cultures, biopsies, blood, body fluids, etc.) X- 1 5 Specimen(s) / Culture(s) sent or taken to Lab for analysis []  - 0 Patient Transfer (multiple staff / [employer] fast streamer / Similar devices) []  - 0 Simple Staple / Suture removal (25 or less) []  - 0 Complex Staple / Suture removal (26 or more) []  - 0 Hypo / Hyperglycemic Management (close monitor of Blood Glucose) []  - 0 Ankle / Brachial Index (ABI) - do not check if billed separately X- 1 5 Vital Signs [name], [name] ([id number]) Has the patient been seen at the hospital within the last three years: Yes Total Score: 90 Level Of Care: New/Established - Level 3 Electronic Signature(s) Signed: 11/18/2018 3:00:04 PM By: [name] Entered By: [name] on 11/18/2018 13:24:58 [name] ([id number]) -------------------------------------------------------------------------------- Lower Extremity Assessment Details Patient Name: [name] Date of Service: 11/18/2018 12:30 PM Medical Record Number: [id number] Patient Account Number: [id number] Date of Birth/Sex: Sep 21, 1950 (68 y.o. F) Treating RN: [name] Primary Care [name]: [name] Other  Clinician: Referring [name]: [name] Treating [name]/Extender: [name], [name]: 9 Edema Assessment Assessed: [Left: No] [Right: No] [Left: Edema] [Right: :] Calf Left: Right: Point of Measurement: 32 cm From Medial Instep cm 37 cm Ankle Left: Right: Point of Measurement: 12 cm From Medial Instep cm 26 cm Vascular Assessment Pulses: Dorsalis Pedis Palpable: [Right:Yes] Extremity colors, hair growth, and conditions: Extremity Color: [Right:Red] Hair Growth on Extremity: [Right:Yes] Temperature of Extremity: [Right:Warm < 3 seconds] Toe Nail Assessment Left: Right: Thick: No Discolored: No Deformed: No Improper Length and Hygiene: No Electronic Signature(s) Signed: 11/18/2018 4:10:47 PM By: [name], BSN, RN, CWS, [name] RN, BSN Entered By: [name], BSN, RN, CWS, [name] on 11/18/2018 12:50:29 [name] ([id number]) -------------------------------------------------------------------------------- Multi Wound Chart Details Patient Name: [name], [name]. Date of Service: 11/18/2018 12:30 PM Medical Record Number: [id number] Patient Account Number: [id number] Date of Birth/Sex: May 10, 1951 (68 y.o. F) Treating RN: [name] Primary Care Anselmo Reihl: [name] Other Clinician: Referring Braycen Burandt: [name] Treating Yaniris Braddock/Extender: [name], [name]: 9 Vital Signs Height(in): 63 Pulse(bpm): 63 Weight(lbs): 174 Blood Pressure(mmHg): 108/46 Body Mass Index(BMI): 31 Temperature(F): 98.4 Respiratory Rate 16 (breaths/min): Photos: [N/A:N/A] Wound Location: Right Lower Leg - Lateral N/A N/A Wounding Event: Bump N/A N/A Primary Etiology: Auto-immune N/A N/A Comorbid History: Arrhythmia, Deep Vein N/A N/A Thrombosis, Hypertension, Osteoarthritis, Received Chemotherapy Date Acquired: 08/01/2018 N/A N/A Weeks of Kaufman: 9 N/A N/A Wound Status: Open N/A N/A Measurements L x W x D 7.5x7.9x0.2 N/A N/A (cm) Area (cm) :  46.535 N/A N/A Volume (cm) : 9.307 N/A N/A % Reduction in Area: -975.20% N/A N/A % Reduction in Volume: -330.10% N/A N/A Classification: Full Thickness Without N/A N/A Exposed Support Structures Exudate Amount: Large N/A N/A Exudate Type: Serosanguineous N/A N/A Exudate Color: red,  brown N/A N/A Wound Margin: Flat and Intact N/A N/A Granulation Amount: None Present (0%) N/A N/A Necrotic Amount: Large (67-100%) N/A N/A Necrotic Tissue: Eschar, Adherent Slough N/A N/A Exposed Structures: Fat Layer (Subcutaneous N/A N/A Tissue) Exposed: Yes Fascia: No Tendon: No [name], [name]. ([id number]) Muscle: No Joint: No Bone: No Epithelialization: None N/A N/A Periwound Skin Texture: Excoriation: Yes N/A N/A Induration: Yes Callus: No Crepitus: No Rash: No Scarring: No Periwound Skin Moisture: Maceration: No N/A N/A Dry/Scaly: No Periwound Skin Color: Ecchymosis: Yes N/A N/A Erythema: Yes Atrophie Blanche: No Cyanosis: No Hemosiderin Staining: No Mottled: No Pallor: No Rubor: No Erythema Location: Circumferential N/A N/A Temperature: No Abnormality N/A N/A Tenderness on Palpation: Yes N/A N/A Wound Preparation: Ulcer Cleansing: N/A N/A Rinsed/Irrigated with Saline, Other: soap and water Topical Anesthetic Applied: Other: lidocaine 4% Kaufman Notes Electronic Signature(s) Signed: 11/18/2018 3:00:04 PM By: [name] Entered By: [name] on 11/18/2018 13:09:10 [name] ([id number]) -------------------------------------------------------------------------------- [place] Details Patient Name: ARIEAL, CUOCO. Date of Service: 11/18/2018 12:30 PM Medical Record Number: [id number] Patient Account Number: [id number] Date of Birth/Sex: Jun 10, 1951 (68 y.o. F) Treating RN: [name] Primary Care Trayvion Embleton: [name] Other Clinician: Referring Erik Nessel: [name] Treating Calah Gershman/Extender: [name], [name]:  9 Active Inactive Necrotic Tissue Nursing Diagnoses: Impaired tissue integrity related to necrotic/devitalized tissue Knowledge deficit related to management of necrotic/devitalized tissue Goals: Necrotic/devitalized tissue will be minimized in the wound bed Date Initiated: 09/12/2018 Target Resolution Date: 12/21/2018 Goal Status: Active Interventions: Assess patient pain level pre-, during and post procedure and prior to discharge Provide education on necrotic tissue and debridement process Kaufman Activities: Apply topical anesthetic as ordered : 09/12/2018 Notes: Orientation to the Wound Care Program Nursing Diagnoses: Knowledge deficit related to the wound healing center program Goals: Patient/caregiver will verbalize understanding of the [place] Date Initiated: 09/12/2018 Target Resolution Date: 12/21/2018 Goal Status: Active Interventions: Provide education on orientation to the wound center Notes: Pain, Acute or Chronic Nursing Diagnoses: Pain Management - Non-cyclic Acute (Procedural) Goals: Patient will verbalize adequate pain control and receive pain control interventions during procedures as needed [name], [name] ([id number]) Date Initiated: 09/12/2018 Target Resolution Date: 12/21/2018 Goal Status: Active Interventions: Assess comfort goal upon admission Encourage patient to take pain medications as prescribed Kaufman Activities: Administer pain control measures as ordered : 09/12/2018 Notes: Wound/Skin Impairment Nursing Diagnoses: Impaired tissue integrity Goals: Ulcer/skin breakdown will have a volume reduction of 30% by week 4 Date Initiated: 09/12/2018 Target Resolution Date: 12/21/2018 Goal Status: Active Interventions: Assess patient/caregiver ability to obtain necessary supplies Assess patient/caregiver ability to perform ulcer/skin care regimen upon admission and as needed Assess ulceration(s) every visit Kaufman  Activities: Referred to DME Bless Belshe for dressing supplies : 09/12/2018 Skin care regimen initiated : 09/12/2018 Notes: Electronic Signature(s) Signed: 11/18/2018 3:00:04 PM By: [name] Entered By: [name] on 11/18/2018 [name], Joyce J. ([id number]) -------------------------------------------------------------------------------- Pain Assessment Details Patient Name: [name], [name]. Date of Service: 11/18/2018 12:30 PM Medical Record Number: [id number] Patient Account Number: [id number] Date of Birth/Sex: October 28, 1950 (68 y.o. F) Treating RN: [name] Primary Care Varshini Arrants: [name] Other Clinician: Referring Jacori Mulrooney: [name] Treating Ismail Graziani/Extender: [name], [name]: 9 Active Problems Location of Pain Severity and Description of Pain Patient Has Paino Yes Site Locations Pain Location: Generalized Pain, Pain in Ulcers Rate the pain. Current Pain Level: 5 Pain Management and Medication Current Pain Management: Electronic Signature(s) Signed: 11/18/2018 4:10:47 PM By:  [name], BSN, RN, CWS, Leisure centre manager, BSN Entered By: [name], BSN, RN, CWS, [name] on 11/18/2018 12:37:37 [name] ([id number]) -------------------------------------------------------------------------------- Patient/Caregiver Education Details Patient Name: [name], [name]. Date of Service: 11/18/2018 12:30 PM Medical Record Number: [id number] Patient Account Number: [id number] Date of Birth/Gender: 05/29/1951 (68 y.o. F) Treating RN: [name] Primary Care Physician: [name] Other Clinician: Referring Physician: Crist Kaufman Treating Physician/Extender: Sharalyn Ink in Kaufman: 9 Education Assessment Education Provided To: Patient Education Topics Provided Wound/Skin Impairment: Handouts: Caring for Your Ulcer Methods: Demonstration, Explain/Verbal Responses: State content correctly Electronic Signature(s) Signed: 11/18/2018 3:00:04 PM By:  [name] Entered By: [name] on 11/18/2018 13:09:25 [name] ([id number]) -------------------------------------------------------------------------------- Wound Assessment Details Patient Name: [name], [name]. Date of Service: 11/18/2018 12:30 PM Medical Record Number: [id number] Patient Account Number: [id number] Date of Birth/Sex: 1951-04-07 (68 y.o. F) Treating RN: [name] Primary Care Dane Kopke: [name] Other Clinician: Referring Joncarlos Atkison: [name] Treating Josanne Boerema/Extender: [name], [name]: 9 Wound Status Wound Number: 1 Primary Auto-immune Etiology: Wound Location: Right Lower Leg - Lateral Wound Open Wounding Event: Bump Status: Date Acquired: 08/01/2018 Comorbid Arrhythmia, Deep Vein Thrombosis, Weeks Of Kaufman: 9 History: Hypertension, Osteoarthritis, Received Clustered Wound: No Chemotherapy Photos Wound Measurements Length: (cm) 7.5 Width: (cm) 7.9 Depth: (cm) 0.2 Area: (cm) 46.535 Volume: (cm) 9.307 % Reduction in Area: -975.2% % Reduction in Volume: -330.1% Epithelialization: None Tunneling: No Undermining: No Wound Description Full Thickness Without Exposed Support Foul O Classification: Structures Slough Wound Margin: Flat and Intact Exudate Large Amount: Exudate Type: Serosanguineous Exudate Color: red, brown dor After Cleansing: No /Fibrino Yes Wound Bed Granulation Amount: None Present (0%) Exposed Structure Necrotic Amount: Large (67-100%) Fascia Exposed: No Necrotic Quality: Eschar, Adherent Slough Fat Layer (Subcutaneous Tissue) Exposed: Yes Tendon Exposed: No Muscle Exposed: No Joint Exposed: No Bone Exposed: No Dansby, Joyce J. ([id number]) Periwound Skin Texture Texture Color No Abnormalities Noted: No No Abnormalities Noted: No Callus: No Atrophie Blanche: No Crepitus: No Cyanosis: No Excoriation: Yes Ecchymosis: Yes Induration: Yes Erythema: Yes Rash: No Erythema  Location: Circumferential Scarring: No Hemosiderin Staining: No Mottled: No Moisture Pallor: No No Abnormalities Noted: No Rubor: No Dry / Scaly: No Maceration: No Temperature / Pain Temperature: No Abnormality Tenderness on Palpation: Yes Wound Preparation Ulcer Cleansing: Rinsed/Irrigated with Saline, Other: soap and water, Topical Anesthetic Applied: Other: lidocaine 4%, Electronic Signature(s) Signed: 11/18/2018 2:15:34 PM By: [name] Signed: 11/18/2018 4:10:47 PM By: [name], BSN, RN, CWS, [name] RN, BSN Entered By: [name] on 11/18/2018 14:15:33 [name] ([id number]) -------------------------------------------------------------------------------- [place] Details Patient Name: [name], [name]. Date of Service: 11/18/2018 12:30 PM Medical Record Number: [id number] Patient Account Number: [id number] Date of Birth/Sex: 1951/05/03 (68 y.o. F) Treating RN: [name] Primary Care Belladonna Kaufman: [name] Other Clinician: Referring Olvin Rohr: [name] Treating Laquia Rosano/Extender: [name], [name]: 9 Vital Signs Time Taken: 12:37 Temperature (F): 98.4 Height (in): 63 Pulse (bpm): 63 Weight (lbs): 174 Respiratory Rate (breaths/min): 16 Body Mass Index (BMI): 30.8 Blood Pressure (mmHg): 108/46 Reference Range: 80 - 120 mg / dl Electronic Signature(s) Signed: 11/18/2018 4:10:47 PM By: [name], BSN, RN, CWS, [name] RN, BSN Entered By: [name], BSN, RN, CWS, [name] on 11/18/2018 29:79:89

## 2018-11-19 NOTE — Progress Notes (Signed)
[name], [name] ([id number]) Visit Report for 11/18/2018 Chief Complaint Document Details Patient Name: [name], [name]. Date of Service: 11/18/2018 12:30 PM Medical Record Number: [id number] Patient Account Number: [id number] Date of Birth/Sex: 02/29/1951 (68 y.o. F) Treating RN: Harold Barban Primary Care Provider: Crist Infante Other Clinician: Referring Provider: Crist Infante Treating Provider/Extender: Melburn Hake, Emmory Solivan Weeks in Treatment: 9 Information Obtained from: Patient Chief Complaint Right LE Ulcer Electronic Signature(s) Signed: 11/19/2018 8:30:49 AM By: Worthy Keeler PA-C Entered By: Worthy Keeler on 11/18/2018 12:55:07 [name] ([id number]) -------------------------------------------------------------------------------- HPI Details Patient Name: [name] Date of Service: 11/18/2018 12:30 PM Medical Record Number: [id number] Patient Account Number: [id number] Date of Birth/Sex: 1950-11-24 (68 y.o. 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 She states that she often has areas like this that will come up but normally not to the severity. She's typically able to take care of these herself. However she's been having a lot of trouble since this past summer in particular. 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 She was most recently treated with this by her primary care provider. Fortunately that seems to have cleared up any infection that she may have had. Nonetheless other pertinent medical history includes the chronic long-term use of Eliquis secondary to  atrial fibrillation. Her ABI appear to be okay today. She did have a DVT 13 years ago and this lag but has not had anything recently. The Eliquis seems to be beneficial in this regard. Otherwise there's no evidence of active infection at this time. No fevers, chills, nausea, or vomiting noted at this time. 09/19/18 on evaluation today patient presents for follow-up concerning her ongoing lower extremity wound. This is her second visit here in our clinic. She does have slough covering the surface of the wound today. Fortunately there is no signs of infection at this time. Overall I have been pleased with the interval change although she still has a lot of Slough I do feel like the Iodoflex was of benefit for her. We did have to change her wrap once in the weeks since I last saw her fortunately after that changed nothing seem to smell or drain through. 09/27/18 on evaluation today patient appears to be doing better in regard to her right lateral lower extremity ulcer. She's been tolerating the dressing changes without complication. Fortunately there is no sign of infection at this time. With that being said we have had to change the wrap at least once in the interim between when we see her and when she comes back in for evaluation due to the amount of drainage an odor. With that being said she is getting very frustrated with the length of time is taken get this to heal. Fortunately there is no evidence of anything worsening and a lot of the slough is improving she has good granulation seems to be peeking out from underneath. She is still having discomfort unfortunately. 10/03/18 on evaluation today patient's wound actually appears to be doing better from the standpoint of feeling in. With that being said she actually does have some slough covering the surface of the wound unfortunately. This is going to  require sharp debridement prior to application of EpiFix. 10/15/18 on evaluation today patient actually  appears to be doing much better in regard to her wound. I do believe that the EpiFix has been of benefit for her which is excellent news. Overall I think that this is something I would definitely recommend continuing I've seen definite progress. 10/22/18 on evaluation today patient actually appears to be doing very well in regard to her leg ulcer. This is shown signs of excellent improvement measurement wise she is continuing to week by week make great progress in my pinion. Overall very pleased in this regard. Fortunately there's no signs of infection at this time. I do believe that the EpiFix has been beneficial for her. 10/29/18 on evaluation today patient actually appears to be doing very well today. The EpiFix does seem to be benefiting her as far as the overall improvement we have seen is concerned. Fortunately there's no signs of infection at this time and again her pain is much less than what it used to be. Overall I feel like she's making excellent progress. 11/12/18 on evaluation today patient presents after unfortunately having had a stroke since the last time I seen her until today. She tells me that she's actually doing very well in this regard she did have some rehab in the hospital and then subsequently was discharged to home. She is home at this point. Nonetheless she has physical therapy a potential occupational therapy is gonna be coming out to see her. Nonetheless she tells me that she is not having any evidence at this time of active infection which is good news she's not having any evidence of pain either in regard to the right lateral lower extremity. Overall I feel like she has done well as far as I'm concerned in the EpiFix which did stay in place for longer than the week obviously I think still have good effect based on what I'm seeing at this point the wound is significantly smaller. [name], [name] ([id number]) 11/18/18 on evaluation today patient comes in one day early for  evaluation here in our clinic due to issues that she's having with her right lower extremity she states she was having a lot of discomfort and could not wait any longer to have this checked. Fortunately she did come in because she's actually having what appears to be significant infection which needs to be addressed I did not want this to continue without being addressed. No fevers, chills, nausea, or vomiting noted at this time. Electronic Signature(s) Signed: 11/19/2018 8:30:49 AM By: Worthy Keeler PA-C Entered By: Worthy Keeler on 11/18/2018 13:20:40 [name] ([id number]) -------------------------------------------------------------------------------- Physical Exam Details Patient Name: [name], [name]. Date of Service: 11/18/2018 12:30 PM Medical Record Number: [id number] Patient Account Number: [id number] Date of Birth/Sex: 1950-10-25 (68 y.o. F) Treating RN: Harold Barban Primary Care Provider: Crist Infante Other Clinician: Referring Provider: Crist Infante Treating Provider/Extender: STONE III, Quinlan Mcfall Weeks in Treatment: 9 Constitutional Well-nourished and well-hydrated in no acute distress. Respiratory normal breathing without difficulty. clear to auscultation bilaterally. Cardiovascular regular rate and rhythm with normal S1, S2. Psychiatric this patient is able to make decisions and demonstrates good insight into disease process. Alert and Oriented x 3. pleasant and cooperative. Notes Upon evaluation and inspection at this time patient's wound bed itself doesn't appear to be too bad but she does have skin breakdown all around the edge of the wound which is significant in the area in general is very painful none  of this was noted last week when we actually applied the EpiFix at that time. Obviously this is not a good thing. We will not be applying the EpiFix today obviously. I do think the patient needs an antibiotic as quickly as possible. Electronic  Signature(s) Signed: 11/19/2018 8:30:49 AM By: Worthy Keeler PA-C Entered By: Worthy Keeler on 11/18/2018 13:21:24 [name] ([id number]) -------------------------------------------------------------------------------- Physician Orders Details Patient Name: [name], [name]. Date of Service: 11/18/2018 12:30 PM Medical Record Number: [id number] Patient Account Number: [id number] Date of Birth/Sex: Apr 05, 1951 (68 y.o. 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 o May shower with protection. - Please do not get your wrap wet Anesthetic (add to Medication List) Wound #1 Right,Lateral Lower Leg o Topical Lidocaine 4% cream applied to wound bed prior to debridement (In Clinic Only). o Benzocaine Topical Anesthetic Spray applied to wound bed prior to debridement (In Clinic Only). Skin Barriers/Peri-Wound Care Wound #1 Right,Lateral Lower Leg o Other: - Zinc oxide on good tissue to barrier against drainage Primary Wound Dressing Wound #1 Right,Lateral Lower Leg o Silver Alginate Secondary Dressing Wound #1 Right,Lateral Lower Leg o Conform/Kerlix - Rolled conform to secure o XtraSorb Dressing Change Frequency Wound #1 Right,Lateral Lower Leg o Change dressing every other day. Follow-up Appointments Wound #1 Right,Lateral Lower Leg o Return Appointment in 1 week. Edema Control [name], [name]  ([id number]) Wound #1 Right,Lateral Lower Leg o Elevate legs to the level of the heart and pump ankles as often as possible o Other: - Tubi Grip, two layer Laboratory o Bacteria identified in Wound by Culture (MICRO) - Culture taken from Right Lateral Lower Leg oooo LOINC Code: 6462-6 oooo Convenience Name: Wound culture routine Patient Medications Allergies: codeine, Lyrica, adhesive Notifications Medication Indication Start End doxycycline hyclate 11/18/2018 DOSE 1 - oral 100 mg capsule - 1 capsule oral taken 2 times a day for 14 days. Do not take calcium while taking this antibiotic Electronic Signature(s) Signed: 11/18/2018 1:23:22 PM By: Worthy Keeler PA-C Entered By: Worthy Keeler on 11/18/2018 13:23:21 [name], [name] ([id number]) -------------------------------------------------------------------------------- Problem List Details Patient Name: GAETANA, KAWAHARA. Date of Service: 11/18/2018 12:30 PM Medical Record Number: [id number] Patient Account Number: [id number] Date of Birth/Sex: September 18, 1950 (68 y.o. 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 09/12/2018 No Yes L97.812 Non-pressure chronic ulcer of other part of right lower leg 09/12/2018 No Yes with fat layer exposed I48.0 Paroxysmal atrial fibrillation 09/12/2018 No Yes I10 Essential (primary) hypertension 09/12/2018 No Yes Z79.01 Long term (current) use of anticoagulants 09/12/2018 No Yes Inactive Problems Resolved Problems Electronic Signature(s) Signed: 11/19/2018 8:30:49 AM By: Worthy Keeler PA-C Entered By: Worthy Keeler on 11/18/2018 12:55:02 [name]  ([id number]) -------------------------------------------------------------------------------- Progress Note Details Patient Name: [name] Date of Service: 11/18/2018 12:30 PM Medical Record Number: [id number] Patient Account Number:  [id number] Date of Birth/Sex: 1950/12/14 (68 y.o. 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 She states that she often has areas like this that will come up but normally not to the severity. She's typically able to take care of these herself. However she's been having a lot of trouble since this past summer in particular. 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 She was most recently treated with this by her primary care provider. Fortunately that seems to have cleared up any infection that she may have had. Nonetheless other pertinent medical history includes the chronic long-term use of Eliquis secondary to atrial fibrillation. Her ABI appear to be okay today. She did have a DVT 13 years ago and this lag but has not had anything recently. The Eliquis seems to be beneficial in this regard. Otherwise there's no evidence of active infection at this time. No fevers, chills, nausea, or vomiting noted at this time. 09/19/18 on evaluation today patient presents for follow-up concerning her ongoing lower extremity wound. This is her second visit here in our clinic. She does have slough covering the surface of the wound today. Fortunately there is no signs of infection at this time. Overall I  have been pleased with the interval change although she still has a lot of Slough I do feel like the Iodoflex was of benefit for her. We did have to change her wrap once in the weeks since I last saw her fortunately after that changed nothing seem to smell or drain through. 09/27/18 on evaluation today patient appears to be doing better in regard to her right lateral lower extremity ulcer. She's been tolerating the dressing changes without complication. Fortunately there is no sign of infection at this time. With that being said we have had to change the wrap at least once in the interim between when we see her and when she comes back in for evaluation due to the amount of drainage an odor. With that being said she is getting very frustrated with the length of time is taken get this to heal. Fortunately there is no evidence of anything worsening and a lot of the slough is improving she has good granulation seems to be peeking out from underneath. She is still having discomfort unfortunately. 10/03/18 on evaluation today patient's wound actually appears to be doing better from the standpoint of feeling in. With that being said she actually does have some slough covering the surface of the wound unfortunately. This is going to require sharp debridement prior to application of EpiFix. 10/15/18 on evaluation today patient actually appears to be doing much better in regard to her wound. I do believe that the EpiFix has been of benefit for her which is excellent news. Overall I think that this is something I would definitely recommend continuing I've seen definite progress. 10/22/18 on evaluation today patient actually appears to be doing very well in regard to her leg ulcer. This is shown signs of excellent improvement measurement wise she is continuing to week by week make great progress in my pinion. Overall very pleased in this regard. Fortunately there's no  signs of infection at this time. I do believe  that the EpiFix has been beneficial for her. 10/29/18 on evaluation today patient actually appears to be doing very well today. The EpiFix does seem to be benefiting her as far as the overall improvement we have seen is concerned. Fortunately there's no signs of infection at this time and again her pain is much less than what it used to be. Overall I feel like she's making excellent progress. 11/12/18 on evaluation today patient presents after unfortunately having had a stroke since the last time I seen her until today. [name], [name] ([id number]) She tells me that she's actually doing very well in this regard she did have some rehab in the hospital and then subsequently was discharged to home. She is home at this point. Nonetheless she has physical therapy a potential occupational therapy is gonna be coming out to see her. Nonetheless she tells me that she is not having any evidence at this time of active infection which is good news she's not having any evidence of pain either in regard to the right lateral lower extremity. Overall I feel like she has done well as far as I'm concerned in the EpiFix which did stay in place for longer than the week obviously I think still have good effect based on what I'm seeing at this point the wound is significantly smaller. 11/18/18 on evaluation today patient comes in one day early for evaluation here in our clinic due to issues that she's having with her right lower extremity she states she was having a lot of discomfort and could not wait any longer to have this checked. Fortunately she did come in because she's actually having what appears to be significant infection which needs to be addressed I did not want this to continue without being addressed. No fevers, chills, nausea, or vomiting noted at this time. Patient History Information obtained from Patient. Family History Cancer - Father, Diabetes - Maternal Grandparents, Lung Disease - Father, Stroke -  Father, No family history of Heart Disease, Hereditary Spherocytosis, Hypertension, Kidney Disease, Seizures, Thyroid Problems, Tuberculosis. Social History Never smoker, Marital Status - Married, Alcohol Use - Never, Drug Use - No History, Caffeine Use - Daily. Medical History Eyes Denies history of Cataracts, Glaucoma, Optic Neuritis Ear/Nose/Mouth/Throat Denies history of Chronic sinus problems/congestion, Middle ear problems Hematologic/Lymphatic Denies history of Anemia, Hemophilia, Human Immunodeficiency Virus, Sickle Cell Disease Respiratory Denies history of Aspiration, Asthma, Chronic Obstructive Pulmonary Disease (COPD), Pneumothorax, Sleep Apnea, Tuberculosis Cardiovascular Patient has history of Arrhythmia - a fib, Deep Vein Thrombosis - 13 years ago, Hypertension Denies history of Angina, Congestive Heart Failure, Coronary Artery Disease, Hypotension, Myocardial Infarction, Peripheral Arterial Disease, Peripheral Venous Disease, Phlebitis, Vasculitis Gastrointestinal Denies history of Cirrhosis , Colitis, Crohn s, Hepatitis A, Hepatitis B, Hepatitis C Endocrine Denies history of Type I Diabetes, Type II Diabetes Genitourinary Denies history of End Stage Renal Disease Immunological Denies history of Lupus Erythematosus, Raynaud s, Scleroderma Integumentary (Skin) Denies history of History of Burn, History of pressure wounds Musculoskeletal Patient has history of Osteoarthritis Denies history of Gout, Rheumatoid Arthritis, Osteomyelitis Neurologic Denies history of Dementia, Neuropathy, Quadriplegia, Paraplegia, Seizure Disorder Oncologic Patient has history of Received Chemotherapy - past for Wegeners Denies history of Received Radiation Psychiatric Denies history of Anorexia/bulimia, Confinement Anxiety Medical And Surgical History Notes [name], [name] ([id number]) Immunological Wegeners Musculoskeletal OP Review of Systems (ROS) Constitutional Symptoms  (General Health) Denies complaints or symptoms of Fever, Chills. Respiratory The patient  has no complaints or symptoms. Cardiovascular Complains or has symptoms of LE edema. Psychiatric The patient has no complaints or symptoms. Objective Constitutional Well-nourished and well-hydrated in no acute distress. Vitals Time Taken: 12:37 PM, Height: 63 in, Weight: 174 lbs, BMI: 30.8, Temperature: 98.4 F, Pulse: 63 bpm, Respiratory Rate: 16 breaths/min, Blood Pressure: 108/46 mmHg. Respiratory normal breathing without difficulty. clear to auscultation bilaterally. Cardiovascular regular rate and rhythm with normal S1, S2. Psychiatric this patient is able to make decisions and demonstrates good insight into disease process. Alert and Oriented x 3. pleasant and cooperative. General Notes: Upon evaluation and inspection at this time patient's wound bed itself doesn't appear to be too bad but she does have skin breakdown all around the edge of the wound which is significant in the area in general is very painful none of this was noted last week when we actually applied the EpiFix at that time. Obviously this is not a good thing. We will not be applying the EpiFix today obviously. I do think the patient needs an antibiotic as quickly as possible. Integumentary (Hair, Skin) Wound #1 status is Open. Original cause of wound was Bump. The wound is located on the Right,Lateral Lower Leg. The wound measures 7.5cm length x 7.9cm width x 0.2cm depth; 46.535cm^2 area and 9.307cm^3 volume. There is Fat Layer (Subcutaneous Tissue) Exposed exposed. There is no tunneling or undermining noted. There is a large amount of serosanguineous drainage noted. The wound margin is flat and intact. There is no granulation within the wound bed. There is a large (67-100%) amount of necrotic tissue within the wound bed including Eschar and Adherent Slough. The periwound skin appearance exhibited: Excoriation, Induration,  Ecchymosis, Erythema. The periwound skin appearance did not exhibit: Callus, Crepitus, Rash, Scarring, Dry/Scaly, Maceration, Atrophie Blanche, Cyanosis, Hemosiderin Staining, Mottled, Pallor, Rubor. The surrounding wound skin color is noted with erythema which is circumferential. Periwound temperature was noted as No Abnormality. The periwound has tenderness on palpation. 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 May shower with protection. - Please do not get your wrap wet Anesthetic (add to Medication List): Wound #1 Right,Lateral Lower Leg: Topical Lidocaine 4% cream applied to wound bed prior to debridement (In Clinic Only). Benzocaine Topical Anesthetic Spray applied to wound bed prior to debridement (In Clinic Only). Skin Barriers/Peri-Wound Care: Wound #1 Right,Lateral Lower Leg: Other: - Zinc oxide on good tissue to barrier against drainage Primary Wound Dressing: Wound #1 Right,Lateral Lower Leg: Silver Alginate Secondary Dressing: Wound #1 Right,Lateral Lower Leg: Conform/Kerlix - Rolled conform to secure XtraSorb Dressing Change Frequency: Wound #1 Right,Lateral Lower Leg: Change dressing every other day. Follow-up Appointments: Wound #1 Right,Lateral Lower Leg: Return Appointment in 1 week. Edema Control: Wound #1 Right,Lateral Lower Leg: Elevate legs to the level of the heart and pump ankles as often as possible Other: - Tubi Grip, two layer Laboratory ordered were: Wound culture routine - Culture taken from Right Lateral Lower Leg The following medication(s) was prescribed: doxycycline hyclate oral 100 mg capsule 1 1  capsule oral taken 2 times a day for 14 days. Do not take calcium while taking this antibiotic starting 11/18/2018 [name], [name] ([id number]) Upon evaluation today patient's wound bed actually showed signs of good granulation at  this time there does not appear to be any evidence of severe infection centered in the wound itself it's really the surrounding periwound that was the biggest issue. Nonetheless I think that this needs to be addressed as quickly as possible as far as the infection is concerned. For that reason I'm gonna go ahead and send in a prescription for doxycycline which hopefully should help get things started. If we need to make any changes based on the culture results I will make that determination upon review of the results. Please see above for specific wound care orders. We will see patient for re-evaluation in 1 week(s) here in the clinic. If anything worsens or changes patient will contact our office for additional recommendations. I did advise the patient of things look for such as fevers, chills, nausea, vomiting, or diarrhea as far as what may need to be addressed if she develops the of the symptoms. I really think she's to go to the ER in the event of such. She is aware. Electronic Signature(s) Signed: 11/19/2018 8:30:49 AM By: Worthy Keeler PA-C Entered By: Worthy Keeler on 11/18/2018 13:24:49 [name] ([id number]) -------------------------------------------------------------------------------- ROS/PFSH Details Patient Name: [name], CORELLA. Date of Service: 11/18/2018 12:30 PM Medical Record Number: [id number] Patient Account Number: [id number] Date of Birth/Sex: 1951-08-07 (68 y.o. F) Treating RN: Harold Barban Primary Care Provider: Crist Infante Other Clinician: Referring Provider: Crist Infante Treating Provider/Extender: Melburn Hake, Conal Shetley Weeks in Treatment: 9 Information Obtained From Patient Wound History Do you currently have one or more open  woundso Yes How many open wounds do you currently haveo 1 Approximately how long have you had your woundso 6 weeks How have you been treating your wound(s) until nowo unna boots Has your wound(s) ever healed and then re-openedo No Have you had any lab work done in the past montho No Have you tested positive for an antibiotic resistant organism (MRSA, VRE)o No Have you tested positive for osteomyelitis (bone infection)o No Have you had any tests for circulation on your legso Yes Who ordered the testo [facility] Where was the test doneo 2015 Constitutional Symptoms (General Health) Complaints and Symptoms: Negative for: Fever; Chills Cardiovascular Complaints and Symptoms: Positive for: LE edema Medical History: Positive for: Arrhythmia - a fib; Deep Vein Thrombosis - 13 years ago; Hypertension Negative for: Angina; Congestive Heart Failure; Coronary Artery Disease; Hypotension; Myocardial Infarction; Peripheral Arterial Disease; Peripheral Venous Disease; Phlebitis; Vasculitis Eyes Medical History: Negative for: Cataracts; Glaucoma; Optic Neuritis Ear/Nose/Mouth/Throat Medical History: Negative for: Chronic sinus problems/congestion; Middle ear problems Hematologic/Lymphatic Medical History: Negative for: Anemia; Hemophilia; Human Immunodeficiency Virus; Sickle Cell Disease Respiratory Complaints and Symptoms: No Complaints or Symptoms ELDA, DUNKERSON ([id number]) Medical History: Negative for: Aspiration; Asthma; Chronic Obstructive Pulmonary Disease (COPD); Pneumothorax; Sleep Apnea; Tuberculosis Gastrointestinal Medical History: Negative for: Cirrhosis ; Colitis; Crohnos; Hepatitis A; Hepatitis B; Hepatitis C Endocrine Medical History: Negative for: Type I Diabetes; Type II Diabetes Genitourinary Medical History: Negative for: End Stage Renal Disease Immunological Medical History: Negative for: Lupus Erythematosus; Raynaudos; Scleroderma Past Medical History  Notes: We[facility]ers Integument[facility] (Skin) Medical History: Negative for: History of Burn; History of pressure wounds Musculoskeletal Medical History: Positive for: Osteoarthritis Negative for: Gout; Rheumatoid Arthritis; Osteomyelitis Past Medical History Notes: OP Neurologic Medical History: Negative for: Dementia; Neuropathy; Quadriplegia; Paraplegia; Seizure Disorder Oncologic Medical History: Positive for: Received Chemotherapy - past for Wegeners Negative for: Received Radiation Psychiatric Complaints and Symptoms: No Complaints or Symptoms Medical History: Negative for: Anorexia/bulimia; Confinement Anxiety Mecum, EDNA  JMarland Kitchen ([id number]) Immunizations Pneumococcal Vaccine: Received Pneumococcal Vaccination: Yes Implantable Devices No devices added Family and Social History Cancer: Yes - Father; Diabetes: Yes - Maternal Grandparents; Heart Disease: No; Hereditary Spherocytosis: No; Hypertension: No; Kidney Disease: No; Lung Disease: Yes - Father; Seizures: No; Stroke: Yes - Father; Thyroid Problems: No; Tuberculosis: No; Never smoker; Marital Status - Married; Alcohol Use: Never; Drug Use: No History; Caffeine Use: Daily; Financial Concerns: No; Food, Clothing or Shelter Needs: No; Support System Lacking: No; Transportation Concerns: No; Advanced Directives: Yes (Not Provided); Patient does not want information on Advanced Directives; Medical Power of Attorney: Yes - sister - Criselda Starke (Not Provided) Physician Affirmation I have reviewed and agree with the above information. Electronic Signature(s) Signed: 11/18/2018 3:00:04 PM By: Harold Barban Signed: 11/19/2018 8:30:49 AM By: Worthy Keeler PA-C Entered By: Worthy Keeler on 11/18/2018 13:20:55 [name] ([id number]) -------------------------------------------------------------------------------- SuperBill Details Patient Name: LAISA, LARRICK. Date of Service: 11/18/2018 Medical Record Number:  [id number] Patient Account Number: [id number] Date of Birth/Sex: August 19, 1951 (68 y.o. 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 I48.0 Paroxysmal atrial fibrillation I10 Essential (primary) hypertension Z79.01 Long term (current) use of anticoagulants Physician Procedures CPT4 Code Description: [id number] - WC PHYS LEVEL 5 - EST PT ICD-10 Diagnosis Description 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

## 2018-11-20 LAB — AEROBIC CULTURE W GRAM STAIN (SUPERFICIAL SPECIMEN)

## 2018-11-22 DIAGNOSIS — I69351 Hemiplegia and hemiparesis following cerebral infarction affecting right dominant side: Secondary | ICD-10-CM | POA: Diagnosis not present

## 2018-11-22 DIAGNOSIS — G629 Polyneuropathy, unspecified: Secondary | ICD-10-CM | POA: Diagnosis not present

## 2018-11-22 DIAGNOSIS — I69328 Other speech and language deficits following cerebral infarction: Secondary | ICD-10-CM | POA: Diagnosis not present

## 2018-11-22 DIAGNOSIS — Z86718 Personal history of other venous thrombosis and embolism: Secondary | ICD-10-CM | POA: Diagnosis not present

## 2018-11-22 DIAGNOSIS — I10 Essential (primary) hypertension: Secondary | ICD-10-CM | POA: Diagnosis not present

## 2018-11-22 DIAGNOSIS — I69392 Facial weakness following cerebral infarction: Secondary | ICD-10-CM | POA: Diagnosis not present

## 2018-11-25 ENCOUNTER — Ambulatory Visit: Payer: Medicare Other | Admitting: Family Medicine

## 2018-11-26 ENCOUNTER — Other Ambulatory Visit: Payer: Self-pay

## 2018-11-26 ENCOUNTER — Encounter: Payer: Medicare Other | Attending: Physician Assistant | Admitting: Physician Assistant

## 2018-11-26 DIAGNOSIS — Z8673 Personal history of transient ischemic attack (TIA), and cerebral infarction without residual deficits: Secondary | ICD-10-CM | POA: Insufficient documentation

## 2018-11-26 DIAGNOSIS — L97812 Non-pressure chronic ulcer of other part of right lower leg with fat layer exposed: Secondary | ICD-10-CM | POA: Insufficient documentation

## 2018-11-26 DIAGNOSIS — I10 Essential (primary) hypertension: Secondary | ICD-10-CM | POA: Insufficient documentation

## 2018-11-26 DIAGNOSIS — Z823 Family history of stroke: Secondary | ICD-10-CM | POA: Diagnosis not present

## 2018-11-26 DIAGNOSIS — I480 Paroxysmal atrial fibrillation: Secondary | ICD-10-CM | POA: Insufficient documentation

## 2018-11-26 DIAGNOSIS — Z9221 Personal history of antineoplastic chemotherapy: Secondary | ICD-10-CM | POA: Insufficient documentation

## 2018-11-26 DIAGNOSIS — M359 Systemic involvement of connective tissue, unspecified: Secondary | ICD-10-CM | POA: Diagnosis not present

## 2018-11-26 DIAGNOSIS — M1990 Unspecified osteoarthritis, unspecified site: Secondary | ICD-10-CM | POA: Diagnosis not present

## 2018-11-26 DIAGNOSIS — Z7901 Long term (current) use of anticoagulants: Secondary | ICD-10-CM | POA: Diagnosis not present

## 2018-11-26 DIAGNOSIS — M313 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: Secondary | ICD-10-CM | POA: Insufficient documentation

## 2018-11-26 DIAGNOSIS — Z86718 Personal history of other venous thrombosis and embolism: Secondary | ICD-10-CM | POA: Insufficient documentation

## 2018-11-26 NOTE — Progress Notes (Signed)
DANETTA, PROM ([id number]) Visit Report for 11/26/2018 Chief Complaint Document Details Patient Name: [name], [name]. Date of Service: 11/26/2018 1:00 PM Medical Record Number: [id number] Patient Account Number: [id number] Date of Birth/Sex: 08-15-1951 (68 y.o. F) Treating RN: Montey Hora Primary Care Provider: Crist Infante Other Clinician: Referring Provider: Crist Infante Treating Provider/Extender: Melburn Hake, Dontrel Smethers Weeks in Treatment: 10 Information Obtained from: Patient Chief Complaint Right LE Ulcer Electronic Signature(s) Signed: 11/26/2018 5:10:39 PM By: Worthy Keeler PA-C Entered By: Worthy Keeler on 11/26/2018 13:22:20 [name] ([id number]) -------------------------------------------------------------------------------- Debridement Details Patient Name: [name] Date of Service: 11/26/2018 1:00 PM Medical Record Number: [id number] Patient Account Number: [id number] Date of Birth/Sex: [birth year] (68 y.o. F) Treating RN: Montey Hora Primary Care Provider: Crist Infante Other Clinician: Referring Provider: Crist Infante Treating Provider/Extender: Melburn Hake, Hanz Winterhalter Weeks in Treatment: 10 Debridement Performed for Wound #1 Right,Lateral Lower Leg Assessment: Performed By: Physician STONE III, Shauntia Levengood E., PA-C Debridement Type: Debridement Level of Consciousness (Pre- Awake and Alert procedure): Pre-procedure Verification/Time Yes - 13:40 Out Taken: Start Time: 13:40 Pain Control: Lidocaine 4% Topical Solution Total Area Debrided (L x W): 3.5 (cm) x 3 (cm) = 10.5 (cm) Tissue and other material Viable, Non-Viable, Slough, Subcutaneous, Slough debrided: Level: Skin/Subcutaneous Tissue Debridement Description: Excisional Instrument: Curette Bleeding: Minimum Hemostasis Achieved: Pressure End Time: 13:42 Procedural Pain: 0 Post Procedural Pain: 0 Response to Treatment: Procedure was tolerated well Level of Consciousness Awake and  Alert (Post-procedure): Post Debridement Measurements of Total Wound Length: (cm) 3.5 Width: (cm) 3 Depth: (cm) 0.3 Volume: (cm) 2.474 Character of Wound/Ulcer Post Debridement: Improved Post Procedure Diagnosis Same as Pre-procedure Electronic Signature(s) Signed: 11/26/2018 2:58:29 PM By: Montey Hora Signed: 11/26/2018 5:10:39 PM By: Worthy Keeler PA-C Entered By: Montey Hora on 11/26/2018 13:43:05 [name] ([id number]) -------------------------------------------------------------------------------- HPI Details Patient Name: [name] Date of Service: 11/26/2018 1:00 PM Medical Record Number: [id number] Patient Account Number: [id number] Date of Birth/Sex: 09-01-1950 (68 y.o. 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 She states that she often has areas like this that will come up but normally not to the severity. She's typically able to take care of these herself. However she's been having a lot of trouble since this past summer in particular. 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 She was most recently treated with this by her primary care provider. Fortunately that seems to have cleared up any infection that she may have had. Nonetheless other pertinent medical history includes the chronic long-term use of Eliquis secondary to atrial fibrillation. Her ABI appear to be okay today. She did have a DVT 13 years ago and this lag but has not had anything recently. The Eliquis seems to be beneficial in this regard. Otherwise there's no evidence of active  infection at this time. No fevers, chills, nausea, or vomiting noted at this time. 09/19/18 on evaluation today patient presents for follow-up concerning her ongoing lower extremity wound. This is her second visit here in our clinic. She does have slough covering the surface of the wound today. Fortunately there is no signs of infection at this time. Overall I have been  pleased with the interval change although she still has a lot of Slough I do feel like the Iodoflex was of benefit for her. We did have to change her wrap once in the weeks since I last saw her fortunately after that changed nothing seem to smell or drain through. 09/27/18 on evaluation today patient appears to be doing better in regard to her right lateral lower extremity ulcer. She's been tolerating the dressing changes without complication. Fortunately there is no sign of infection at this time. With that being said we have had to change the wrap at least once in the interim between when we see her and when she comes back in for evaluation due to the amount of drainage an odor. With that being said she is getting very frustrated with the length of time is taken get this to heal. Fortunately there is no evidence of anything worsening and a lot of the slough is improving she has good granulation seems to be peeking out from underneath. She is still having discomfort unfortunately. 10/03/18 on evaluation today patient's wound actually appears to be doing better from the standpoint of feeling in. With that being said she actually does have some slough covering the surface of the wound unfortunately. This is going to require sharp debridement prior to application of EpiFix. 10/15/18 on evaluation today patient actually appears to be doing much better in regard to her wound. I do believe that the EpiFix has been of benefit for her which is excellent news. Overall I think that this is something I would definitely recommend continuing I've  seen definite progress. 10/22/18 on evaluation today patient actually appears to be doing very well in regard to her leg ulcer. This is shown signs of excellent improvement measurement wise she is continuing to week by week make great progress in my pinion. Overall very pleased in this regard. Fortunately there's no signs of infection at this time. I do believe that the EpiFix has been beneficial for her. 10/29/18 on evaluation today patient actually appears to be doing very well today. The EpiFix does seem to be benefiting her as far as the overall improvement we have seen is concerned. Fortunately there's no signs of infection at this time and again her pain is much less than what it used to be. Overall I feel like she's making excellent progress. 11/12/18 on evaluation today patient presents after unfortunately having had a stroke since the last time I seen her until today. She tells me that she's actually doing very well in this regard she did have some rehab in the hospital and then subsequently was discharged to home. She is home at this point. Nonetheless she has physical therapy a potential occupational therapy is gonna be coming out to see her. Nonetheless she tells me that she is not having any evidence at this time of active infection which is good news she's not having any evidence of pain either in regard to the right lateral lower extremity. Overall I feel like she has done well as far as I'm concerned in the EpiFix which did stay in place for longer than the week obviously I think still have good effect based on what I'm seeing at this point the wound is significantly smaller. [name], [name] ([id number]) 11/18/18 on evaluation today patient comes in one day early for evaluation here in our clinic due to issues that she's having with her right lower extremity she states she was having a lot of discomfort and could  not wait any longer to have this checked. Fortunately she did come in  because she's actually having what appears to be significant infection which needs to be addressed I did not want this to continue without being addressed. No fevers, chills, nausea, or vomiting noted at this time. 11/26/18 on evaluation today patient will significantly better compared to last week regarding her lower extremity ulcer. Last week she had an infection we finally did get the results of the culture back which did show methicillin-resistant Staphylococcus aureus as the positive organism. Subsequently the antibiotic that she's been on has completely resolved the infection as best I can tell she seems to be doing absolutely excellent today. I'm very pleased in this regard. She's having little to no drainage and no pain. Last week and this was very painful. Electronic Signature(s) Signed: 11/26/2018 5:10:39 PM By: Worthy Keeler PA-C Entered By: Worthy Keeler on 11/26/2018 13:46:06 [name] ([id number]) -------------------------------------------------------------------------------- Physical Exam Details Patient Name: LUPIE, SAWA. Date of Service: 11/26/2018 1:00 PM Medical Record Number: [id number] Patient Account Number: [id number] Date of Birth/Sex: Apr 11, 1951 (68 y.o. F) Treating RN: Montey Hora Primary Care Provider: Crist Infante Other Clinician: Referring Provider: Crist Infante Treating Provider/Extender: STONE III, Zayed Griffie Weeks in Treatment: 10 Constitutional Well-nourished and well-hydrated in no acute distress. Respiratory normal breathing without difficulty. Psychiatric this patient is able to make decisions and demonstrates good insight into disease process. Alert and Oriented x 3. pleasant and cooperative. Notes Patient's wound bed currently showed signs of good granulation at this time she did have some Slough over the original main wound which required some sharp debridement today she tolerated this without complication post debridement wound bed appears to  be doing much better. Otherwise she is showing signs of excellent improvement overall in my pinion. Electronic Signature(s) Signed: 11/26/2018 5:10:39 PM By: Worthy Keeler PA-C Entered By: Worthy Keeler on 11/26/2018 13:47:06 [name] ([id number]) -------------------------------------------------------------------------------- Physician Orders Details Patient Name: [name], [name]. Date of Service: 11/26/2018 1:00 PM Medical Record Number: [id number] Patient Account Number: [id number] Date of Birth/Sex: 1950/11/25 (68 y.o. 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 o May shower with protection. - Please do not get your wrap wet Anesthetic (add to Medication List) Wound #1 Right,Lateral Lower Leg o Topical Lidocaine 4% cream applied to wound bed prior to debridement (In Clinic Only). o Benzocaine Topical Anesthetic Spray applied to wound bed prior to debridement (In Clinic Only). Primary Wound Dressing Wound #1 Right,Lateral Lower Leg o Silver Collagen Secondary Dressing Wound #1 Right,Lateral Lower Leg o ABD pad o Conform/Kerlix - Rolled conform to secure Dressing Change Frequency Wound #1 Right,Lateral Lower Leg o Change dressing every other day. Follow-up Appointments Wound #1 Right,Lateral Lower Leg o Return Appointment in 1 week. Edema Control Wound #1 Right,Lateral Lower Leg o  Elevate legs to the level of the heart and pump ankles as often as possible o Other: - Tubi Grip, two layer [name], [name] ([id number]) Electronic Signature(s) Signed: 11/26/2018 2:58:29 PM By: Montey Hora Signed: 11/26/2018  5:10:39 PM By: Worthy Keeler PA-C Entered By: Montey Hora on 11/26/2018 13:43:54 Kaufman, [name] ([id number]) -------------------------------------------------------------------------------- Problem List Details Patient Name: [name], [name]. Date of Service: 11/26/2018 1:00 PM Medical Record Number: [id number] Patient Account Number: [id number] Date of Birth/Sex: April 01, 1951 (68 y.o. 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 09/12/2018 No Yes L97.812 Non-pressure chronic ulcer of other part of right lower leg 09/12/2018 No Yes with fat layer exposed I48.0 Paroxysmal atrial fibrillation 09/12/2018 No Yes I10 Essential (primary) hypertension 09/12/2018 No Yes Z79.01 Long term (current) use of anticoagulants 09/12/2018 No Yes Inactive Problems Resolved Problems Electronic Signature(s) Signed: 11/26/2018 5:10:39 PM By: Worthy Keeler PA-C Entered By: Worthy Keeler on 11/26/2018 13:22:08 [name] ([id number]) -------------------------------------------------------------------------------- Progress Note Details Patient Name: [name] Date of Service: 11/26/2018 1:00 PM Medical Record Number: [id number] Patient Account Number: [id number] Date of Birth/Sex: 1951-05-21 (68 y.o. 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 She states that she often has areas like this that will come up but normally not to the severity. She's typically able to take care of these herself. However she's been having a lot of trouble since this past summer in particular. 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 She was most recently treated with this by her primary care provider. Fortunately that seems to have cleared up any infection that she may have had. Nonetheless other pertinent medical history includes the chronic long-term use of Eliquis secondary to atrial fibrillation. Her ABI appear to be okay today. She did have a DVT 13 years ago and this lag but has not had anything recently. The Eliquis seems to be beneficial in this regard. Otherwise there's no evidence of active infection at this time. No fevers, chills, nausea, or vomiting noted at this time. 09/19/18 on evaluation today patient presents for follow-up concerning her ongoing lower extremity wound. This is her second visit here in our clinic. She does have slough covering the surface of the wound today. Fortunately there is no signs of infection at this time. Overall I have been pleased with the interval change although she still has a lot of Slough I do feel like the Iodoflex was of benefit for her. We did have to change her wrap once in the weeks since I last saw her fortunately after that changed nothing seem to smell or drain through. 09/27/18 on evaluation today patient appears to be doing better in regard to her right lateral lower extremity ulcer. She's been tolerating the dressing changes without complication. Fortunately there is  no sign of infection at this time. With that being said we have had to change the wrap at least once in the interim between when we see her and when she comes back in for evaluation due to the amount of drainage an odor. With that being said she is getting very frustrated with the length of time  is taken get this to heal. Fortunately there is no evidence of anything worsening and a lot of the slough is improving she has good granulation seems to be peeking out from underneath. She is still having discomfort unfortunately. 10/03/18 on evaluation today patient's wound actually appears to be doing better from the standpoint of feeling in. With that being said she actually does have some slough covering the surface of the wound unfortunately. This is going to require sharp debridement prior to application of EpiFix. 10/15/18 on evaluation today patient actually appears to be doing much better in regard to her wound. I do believe that the EpiFix has been of benefit for her which is excellent news. Overall I think that this is something I would definitely recommend continuing I've seen definite progress. 10/22/18 on evaluation today patient actually appears to be doing very well in regard to her leg ulcer. This is shown signs of excellent improvement measurement wise she is continuing to week by week make great progress in my pinion. Overall very pleased in this regard. Fortunately there's no signs of infection at this time. I do believe that the EpiFix has been beneficial for her. 10/29/18 on evaluation today patient actually appears to be doing very well today. The EpiFix does seem to be benefiting her as far as the overall improvement we have seen is concerned. Fortunately there's no signs of infection at this time and again her pain is much less than what it used to be. Overall I feel like she's making excellent progress. 11/12/18 on evaluation today patient presents after unfortunately having had a stroke  since the last time I seen her until today. [name], [name] ([id number]) She tells me that she's actually doing very well in this regard she did have some rehab in the hospital and then subsequently was discharged to home. She is home at this point. Nonetheless she has physical therapy a potential occupational therapy is gonna be coming out to see her. Nonetheless she tells me that she is not having any evidence at this time of active infection which is good news she's not having any evidence of pain either in regard to the right lateral lower extremity. Overall I feel like she has done well as far as I'm concerned in the EpiFix which did stay in place for longer than the week obviously I think still have good effect based on what I'm seeing at this point the wound is significantly smaller. 11/18/18 on evaluation today patient comes in one day early for evaluation here in our clinic due to issues that she's having with her right lower extremity she states she was having a lot of discomfort and could not wait any longer to have this checked. Fortunately she did come in because she's actually having what appears to be significant infection which needs to be addressed I did not want this to continue without being addressed. No fevers, chills, nausea, or vomiting noted at this time. 11/26/18 on evaluation today patient will significantly better compared to last week regarding her lower extremity ulcer. Last week she had an infection we finally did get the results of the culture back which did show methicillin-resistant Staphylococcus aureus as the positive organism. Subsequently the antibiotic that she's been on has completely resolved the infection as best I can tell she seems to be doing absolutely excellent today. I'm very pleased in this regard. She's having little to no drainage and no pain. Last week and this was very painful. Patient History  Information obtained from Patient. Family History Cancer  - Father, Diabetes - Maternal Grandparents, Lung Disease - Father, Stroke - Father, No family history of Heart Disease, Hereditary Spherocytosis, Hypertension, Kidney Disease, Seizures, Thyroid Problems, Tuberculosis. Social History Never smoker, Marital Status - Married, Alcohol Use - Never, Drug Use - No History, Caffeine Use - Daily. Medical History Eyes Denies history of Cataracts, Glaucoma, Optic Neuritis Ear/Nose/Mouth/Throat Denies history of Chronic sinus problems/congestion, Middle ear problems Hematologic/Lymphatic Denies history of Anemia, Hemophilia, Human Immunodeficiency Virus, Sickle Cell Disease Respiratory Denies history of Aspiration, Asthma, Chronic Obstructive Pulmonary Disease (COPD), Pneumothorax, Sleep Apnea, Tuberculosis Cardiovascular Patient has history of Arrhythmia - a fib, Deep Vein Thrombosis - 13 years ago, Hypertension Denies history of Angina, Congestive Heart Failure, Coronary Artery Disease, Hypotension, Myocardial Infarction, Peripheral Arterial Disease, Peripheral Venous Disease, Phlebitis, Vasculitis Gastrointestinal Denies history of Cirrhosis , Colitis, Crohn s, Hepatitis A, Hepatitis B, Hepatitis C Endocrine Denies history of Type I Diabetes, Type II Diabetes Genitourinary Denies history of End Stage Renal Disease Immunological Denies history of Lupus Erythematosus, Raynaud s, Scleroderma Integumentary (Skin) Denies history of History of Burn, History of pressure wounds Musculoskeletal Patient has history of Osteoarthritis Denies history of Gout, Rheumatoid Arthritis, Osteomyelitis Neurologic Denies history of Dementia, Neuropathy, Quadriplegia, Paraplegia, Seizure Disorder Oncologic LYLLIANA, KITAMURA ([id number]) Patient has history of Received Chemotherapy - past for Wegeners Denies history of Received Radiation Psychiatric Denies history of Anorexia/bulimia, Confinement Anxiety Medical And Surgical History  Notes Immunological Wegeners Musculoskeletal OP Review of Systems (ROS) Constitutional Symptoms (General Health) Denies complaints or symptoms of Fatigue, Fever, Chills, Marked Weight Change. Respiratory Denies complaints or symptoms of Chronic or frequent coughs, Shortness of Breath. Cardiovascular Complains or has symptoms of LE edema. Denies complaints or symptoms of Chest pain. Psychiatric Denies complaints or symptoms of Anxiety, Claustrophobia. Objective Constitutional Well-nourished and well-hydrated in no acute distress. Vitals Time Taken: 1:15 PM, Height: 63 in, Weight: 174 lbs, BMI: 30.8, Temperature: 98.6 F, Pulse: 87 bpm, Respiratory Rate: 18 breaths/min, Blood Pressure: 144/67 mmHg. Respiratory normal breathing without difficulty. Psychiatric this patient is able to make decisions and demonstrates good insight into disease process. Alert and Oriented x 3. pleasant and cooperative. General Notes: Patient's wound bed currently showed signs of good granulation at this time she did have some Slough over the original main wound which required some sharp debridement today she tolerated this without complication post debridement wound bed appears to be doing much better. Otherwise she is showing signs of excellent improvement overall in my pinion. Integumentary (Hair, Skin) Wound #1 status is Open. Original cause of wound was Bump. The wound is located on the Right,Lateral Lower Leg. The wound measures 3.5cm length x 3cm width x 0.2cm depth; 8.247cm^2 area and 1.649cm^3 volume. There is Fat Layer (Subcutaneous Tissue) Exposed exposed. There is no tunneling or undermining noted. There is a large amount of serosanguineous drainage noted. The wound margin is flat and intact. There is no granulation within the wound bed. There is a large (67-100%) amount of necrotic tissue within the wound bed including Eschar and Adherent Slough. The periwound skin appearance exhibited:  Excoriation, Induration, Ecchymosis, Erythema. The periwound skin appearance did not exhibit: Callus, [name], [name]. ([id number]) Crepitus, Rash, Scarring, Dry/Scaly, Maceration, Atrophie Blanche, Cyanosis, Hemosiderin Staining, Mottled, Pallor, Rubor. The surrounding wound skin color is noted with erythema which is circumferential. Periwound temperature was noted as No Abnormality. The periwound has tenderness on palpation. 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 There was a Excisional Skin/Subcutaneous Tissue Debridement with a total area of 10.5 sq cm performed by STONE III, Dolan Xia E., PA-C. With the following instrument(s): Curette to remove Viable and Non-Viable tissue/material. Material removed includes Subcutaneous Tissue and Slough and after achieving pain control using Lidocaine 4% Topical Solution. No specimens were taken. A time out was conducted at 13:40, prior to the start of the procedure. A Minimum amount of bleeding was controlled with Pressure. The procedure was tolerated well with a pain level of 0 throughout and a pain level of 0 following the procedure. Post Debridement Measurements: 3.5cm length x 3cm width x 0.3cm depth; 2.474cm^3 volume. Character of Wound/Ulcer Post Debridement is improved. Post procedure Diagnosis Wound #1: Same as Pre-Procedure Plan Wound Cleansing: Wound #1 Right,Lateral Lower Leg: Clean wound with Normal Saline. May shower with protection. - Please do not get your wrap wet Anesthetic (add to Medication List): Wound #1 Right,Lateral Lower Leg: Topical Lidocaine 4% cream applied to wound bed prior to  debridement (In Clinic Only). Benzocaine Topical Anesthetic Spray applied to wound bed prior to debridement (In Clinic Only). Primary Wound Dressing: Wound #1 Right,Lateral Lower Leg: Silver Collagen Secondary Dressing: Wound #1 Right,Lateral Lower Leg: ABD pad Conform/Kerlix - Rolled conform to secure [name], [name] ([id number]) Dressing Change Frequency: Wound #1 Right,Lateral Lower Leg: Change dressing every other day. Follow-up Appointments: Wound #1 Right,Lateral Lower Leg: Return Appointment in 1 week. Edema Control: Wound #1 Right,Lateral Lower Leg: Elevate legs to the level of the heart and pump ankles as often as possible Other: - Tubi Grip, two layer My suggestion at this point is gonna be that we continue with the above wound care measures for the next week. This will be a switch of adding collagen to the wound bed in place of the alternate everything else will remain the same. If she's doing well that time depending on how things appear we may want to go ahead and reinitiate the EpiFix we may just continue with what we're doing at that point at all depends on how well the wound looks. She's in agreement this plan. Please see above for specific wound care orders. We will see patient for re-evaluation in 1 week(s) here in the clinic. If anything worsens or changes patient will contact our office for additional recommendations. Electronic Signature(s) Signed: 11/26/2018 5:10:39 PM By: Worthy Keeler PA-C Entered By: Worthy Keeler on 11/26/2018 13:47:41 Nation, EDNA JMarland Kitchen ([id number]) -------------------------------------------------------------------------------- ROS/PFSH Details Patient Name: KRYSTENA, REITTER. Date of Service: 11/26/2018 1:00 PM Medical Record Number: [id number] Patient Account Number: [id number] Date of Birth/Sex: [birth year] (68 y.o. F) Treating RN: Montey Hora Primary Care Provider: Crist Infante Other Clinician: Referring Provider: Crist Infante Treating  Provider/Extender: Melburn Hake, Chandon Lazcano Weeks in Treatment: 10 Information Obtained From Patient Constitutional Symptoms (General Health) Complaints and Symptoms: Negative for: Fatigue; Fever; Chills; Marked Weight Change Respiratory Complaints and Symptoms: Negative for: Chronic or frequent coughs; Shortness of Breath Medical History: Negative for: Aspiration; Asthma; Chronic Obstructive Pulmonary Disease (COPD); Pneumothorax; Sleep Apnea; Tuberculosis Cardiovascular Complaints and Symptoms: Positive for: LE edema Negative for: Chest pain Medical History: Positive for: Arrhythmia - a fib; Deep Vein Thrombosis - 13 years ago; Hypertension Negative for: Angina; Congestive Heart Failure; Coronary Artery Disease; Hypotension; Myocardial  Infarction; Peripheral Arterial Disease; Peripheral Venous Disease; Phlebitis; Vasculitis Psychiatric Complaints and Symptoms: Negative for: Anxiety; Claustrophobia Medical History: Negative for: Anorexia/bulimia; Confinement Anxiety Eyes Medical History: Negative for: Cataracts; Glaucoma; Optic Neuritis Ear/Nose/Mouth/Throat Medical History: Negative for: Chronic sinus problems/congestion; Middle ear problems Hematologic/Lymphatic Medical History: Negative for: Anemia; Hemophilia; Human Immunodeficiency Virus; Sickle Cell Disease ABIOLA, BEHRING ([id number]) Gastrointestinal Medical History: Negative for: Cirrhosis ; Colitis; Crohnos; Hepatitis A; Hepatitis B; Hepatitis C Endocrine Medical History: Negative for: Type I Diabetes; Type II Diabetes Genitourinary Medical History: Negative for: End Stage Renal Disease Immunological Medical History: Negative for: Lupus Erythematosus; Raynaudos; Scleroderma Past Medical History Notes: Wegeners Integumentary (Skin) Medical History: Negative for: History of Burn; History of pressure wounds Musculoskeletal Medical History: Positive for: Osteoarthritis Negative for: Gout; Rheumatoid Arthritis;  Osteomyelitis Past Medical History Notes: OP Neurologic Medical History: Negative for: Dementia; Neuropathy; Quadriplegia; Paraplegia; Seizure Disorder Oncologic Medical History: Positive for: Received Chemotherapy - past for Wegeners Negative for: Received Radiation Immunizations Pneumococcal Vaccine: Received Pneumococcal Vaccination: Yes Implantable Devices No devices added Family and Social History Cancer: Yes - Father; Diabetes: Yes - Maternal Grandparents; Heart Disease: No; Hereditary Spherocytosis: No; Hypertension: No; Kidney Disease: No; Lung Disease: Yes - Father; Seizures: No; Stroke: Yes - Father; Thyroid Problems: No; Tuberculosis: No; Never smoker; Marital Status - Married; Alcohol Use: Never; Drug Use: No History; Caffeine Use: Daily; Financial Concerns: No; Food, Clothing or Shelter Needs: No; Support System Lacking: No; Transportation Concerns: No KYAH, BUESING ([id number]) Physician Affirmation I have reviewed and agree with the above information. Electronic Signature(s) Signed: 11/26/2018 2:58:29 PM By: Montey Hora Signed: 11/26/2018 5:10:39 PM By: Worthy Keeler PA-C Entered By: Worthy Keeler on 11/26/2018 13:46:28 BLAKLEIGH, STRAW ([id number]) -------------------------------------------------------------------------------- SuperBill Details Patient Name: GRACEANNA, THEISSEN. Date of Service: 11/26/2018 Medical Record Number: [id number] Patient Account Number: [id number] Date of Birth/Sex: Jul 30, 1951 (68 y.o. 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 I48.0 Paroxysmal atrial fibrillation I10 Essential (primary)  hypertension Z79.01 Long term (current) use of anticoagulants Facility Procedures CPT4 Code Description: 32202542 11042 - DEB SUBQ TISSUE 20 SQ CM/< ICD-10 Diagnosis Description H06.237 Non-pressure chronic ulcer of other part of right lower leg wi Modifier: th fat layer expo Quantity: 1 sed Physician Procedures CPT4 Code Description: 6283151 11042 - WC PHYS SUBQ TISS 20 SQ CM ICD-10 Diagnosis Description V61.607 Non-pressure chronic ulcer of other part of right lower leg wi Modifier: th fat layer expo Quantity: 1 sed Electronic Signature(s) Signed: 11/26/2018 5:10:39 PM By: Worthy Keeler PA-C Entered By: Worthy Keeler on 11/26/2018 13:47:48

## 2018-11-29 DIAGNOSIS — I10 Essential (primary) hypertension: Secondary | ICD-10-CM | POA: Diagnosis not present

## 2018-11-29 DIAGNOSIS — I69392 Facial weakness following cerebral infarction: Secondary | ICD-10-CM | POA: Diagnosis not present

## 2018-11-29 DIAGNOSIS — I69328 Other speech and language deficits following cerebral infarction: Secondary | ICD-10-CM | POA: Diagnosis not present

## 2018-11-29 DIAGNOSIS — I69351 Hemiplegia and hemiparesis following cerebral infarction affecting right dominant side: Secondary | ICD-10-CM | POA: Diagnosis not present

## 2018-11-29 DIAGNOSIS — G629 Polyneuropathy, unspecified: Secondary | ICD-10-CM | POA: Diagnosis not present

## 2018-11-29 DIAGNOSIS — Z86718 Personal history of other venous thrombosis and embolism: Secondary | ICD-10-CM | POA: Diagnosis not present

## 2018-12-02 NOTE — Progress Notes (Signed)
[name], [name] ([id number]) Visit Report for 11/26/2018 Arrival Information Details Patient Name: [name], [name]. Date of Service: 11/26/2018 1:00 PM Medical Record Number: [id number] Patient Account Number: [id number] Date of Birth/Sex: Mar 03, 1951 (68 y.o. F) Treating RN: Harold Barban Primary Care Rickelle Sylvestre: Crist Infante Other Clinician: Referring Abrey Bradway: Crist Infante Treating Rafi Kenneth/Extender: Melburn Hake, [name] Weeks in Treatment: 10 Visit Information History Since Last Visit Added or deleted any medications: No Patient Arrived: [place] Any new allergies or adverse reactions: No Arrival Time: 13:15 Had a fall or experienced change in No Accompanied By: self activities of daily living that may affect Transfer Assistance: None risk of falls: Patient Has Alerts: Yes Signs or symptoms of abuse/neglect since last visito No Patient Alerts: Patient on Blood Thinner Hospitalized since last visit: No Eliquis Has Dressing in Place as Prescribed: Yes Has Compression in Place as Prescribed: Yes Pain Present Now: No Electronic Signature(s) Signed: 12/02/2018 8:41:29 AM By: Harold Barban Entered By: Harold Barban on 11/26/2018 13:17:07 [name] ([id number]) -------------------------------------------------------------------------------- Encounter Discharge Information Details Patient Name: [name], [name]. Date of Service: 11/26/2018 1:00 PM Medical Record Number: [id number] Patient Account Number: [id number] Date of Birth/Sex: 01-22-1951 (68 y.o. F) Treating RN: Cornell Barman Primary Care Barclay Lennox: Crist Infante Other Clinician: Referring Glenisha Gundry: Crist Infante Treating Keanan Melander/Extender: Melburn Hake, [name] Weeks in Treatment: 10 Encounter Discharge Information Items Post Procedure Vitals Discharge Condition: Stable Temperature (F): 98.6 Ambulatory Status: Wheelchair Pulse (bpm): 87 Discharge Destination: Home Respiratory Rate (breaths/min): 18 Transportation: Private Auto Blood  Pressure (mmHg): 144/67 Accompanied By: self Schedule Follow-up Appointment: Yes Clinical Summary of Care: Electronic Signature(s) Signed: 11/26/2018 4:41:32 PM By: Gretta Cool, BSN, RN, CWS, [name] RN, BSN Entered By: Gretta Cool, BSN, RN, CWS, [name] on 11/26/2018 13:48:53 [name] ([id number]) -------------------------------------------------------------------------------- Lower Extremity Assessment Details Patient Name: [name], [name]. Date of Service: 11/26/2018 1:00 PM Medical Record Number: [id number] Patient Account Number: [id number] Date of Birth/Sex: 07-06-1951 (68 y.o. F) Treating RN: Harold Barban Primary Care Rhian Asebedo: Crist Infante Other Clinician: Referring Quoc Tome: Crist Infante Treating Jalesha Plotz/Extender: Melburn Hake, [name] Weeks in Treatment: 10 Edema Assessment Assessed: [Left: No] [Right: No] [Left: Edema] [Right: :] Calf Left: Right: Point of Measurement: 32 cm From Medial Instep cm 36 cm Ankle Left: Right: Point of Measurement: 12 cm From Medial Instep cm 25.3 cm Vascular Assessment Pulses: Dorsalis Pedis Palpable: [Right:Yes] Posterior Tibial Palpable: [Right:Yes] Electronic Signature(s) Signed: 12/02/2018 8:41:29 AM By: Harold Barban Entered By: Harold Barban on 11/26/2018 13:28:37 [name] ([id number]) -------------------------------------------------------------------------------- Multi Wound Chart Details Patient Name: [name]. Date of Service: 11/26/2018 1:00 PM Medical Record Number: [id number] Patient Account Number: [id number] Date of Birth/Sex: Jun 10, 1951 (68 y.o. F) Treating RN: Montey Hora Primary Care Lonza Shimabukuro: Crist Infante Other Clinician: Referring Willis Holquin: Crist Infante Treating Ashanty Coltrane/Extender: Melburn Hake, [name] Weeks in Treatment: 10 Vital Signs Height(in): 63 Pulse(bpm): 87 Weight(lbs): 174 Blood Pressure(mmHg): 144/67 Body Mass Index(BMI): 31 Temperature(F): 98.6 Respiratory Rate 18 (breaths/min): Photos:  [N/A:N/A] Wound Location: Right Lower Leg - Lateral N/A N/A Wounding Event: Bump N/A N/A Primary Etiology: Auto-immune N/A N/A Comorbid History: Arrhythmia, Deep Vein N/A N/A Thrombosis, Hypertension, Osteoarthritis, Received Chemotherapy Date Acquired: 08/01/2018 N/A N/A Weeks of Treatment: 10 N/A N/A Wound Status: Open N/A N/A Measurements L x W x D 3.5x3x0.2 N/A N/A (cm) Area (cm) : 8.247 N/A N/A Volume (cm) : 1.649 N/A N/A % Reduction in Area: -90.50% N/A N/A % Reduction in Volume: 23.80% N/A N/A Classification: Full Thickness Without N/A N/A Exposed Support Structures Exudate Amount: Large  N/A N/A Exudate Type: Serosanguineous N/A N/A Exudate Color: red, brown N/A N/A Wound Margin: Flat and Intact N/A N/A Granulation Amount: None Present (0%) N/A N/A Necrotic Amount: Large (67-100%) N/A N/A Necrotic Tissue: Eschar, Adherent Slough N/A N/A Exposed Structures: Fat Layer (Subcutaneous N/A N/A Tissue) Exposed: Yes Fascia: No Tendon: No TESSICA, CUPO. ([id number]) Muscle: No Joint: No Bone: No Epithelialization: None N/A N/A Periwound Skin Texture: Excoriation: Yes N/A N/A Induration: Yes Callus: No Crepitus: No Rash: No Scarring: No Periwound Skin Moisture: Maceration: No N/A N/A Dry/Scaly: No Periwound Skin Color: Ecchymosis: Yes N/A N/A Erythema: Yes Atrophie Blanche: No Cyanosis: No Hemosiderin Staining: No Mottled: No Pallor: No Rubor: No Erythema Location: Circumferential N/A N/A Temperature: No Abnormality N/A N/A Tenderness on Palpation: Yes N/A N/A Treatment Notes Electronic Signature(s) Signed: 11/26/2018 2:58:29 PM By: Montey Hora Entered By: Montey Hora on 11/26/2018 13:38:01 [name] ([id number]) -------------------------------------------------------------------------------- [place] Details Patient Name: [name], [name]. Date of Service: 11/26/2018 1:00 PM Medical Record Number: [id number] Patient Account  Number: [id number] Date of Birth/Sex: 1950/12/10 (68 y.o. F) Treating RN: Montey Hora Primary Care Gaetana Kawahara: Crist Infante Other Clinician: Referring Jahzier Villalon: Crist Infante Treating Kaydi Kley/Extender: Melburn Hake, [name] Weeks in Treatment: 10 Active Inactive Necrotic Tissue Nursing Diagnoses: Impaired tissue integrity related to necrotic/devitalized tissue Knowledge deficit related to management of necrotic/devitalized tissue Goals: Necrotic/devitalized tissue will be minimized in the wound bed Date Initiated: 09/12/2018 Target Resolution Date: 12/21/2018 Goal Status: Active Interventions: Assess patient pain level pre-, during and post procedure and prior to discharge Provide education on necrotic tissue and debridement process Treatment Activities: Apply topical anesthetic as ordered : 09/12/2018 Notes: Orientation to the Wound Care Program Nursing Diagnoses: Knowledge deficit related to the wound healing center program Goals: Patient/caregiver will verbalize understanding of the [place] Date Initiated: 09/12/2018 Target Resolution Date: 12/21/2018 Goal Status: Active Interventions: Provide education on orientation to the wound center Notes: Pain, Acute or Chronic Nursing Diagnoses: Pain Management - Non-cyclic Acute (Procedural) Goals: Patient will verbalize adequate pain control and receive pain control interventions during procedures as needed [name], [name] ([id number]) Date Initiated: 09/12/2018 Target Resolution Date: 12/21/2018 Goal Status: Active Interventions: Assess comfort goal upon admission Encourage patient to take pain medications as prescribed Treatment Activities: Administer pain control measures as ordered : 09/12/2018 Notes: Wound/Skin Impairment Nursing Diagnoses: Impaired tissue integrity Goals: Ulcer/skin breakdown will have a volume reduction of 30% by week 4 Date Initiated: 09/12/2018 Target Resolution Date: 12/21/2018 Goal Status:  Active Interventions: Assess patient/caregiver ability to obtain necessary supplies Assess patient/caregiver ability to perform ulcer/skin care regimen upon admission and as needed Assess ulceration(s) every visit Treatment Activities: Referred to DME Armya Westerhoff for dressing supplies : 09/12/2018 Skin care regimen initiated : 09/12/2018 Notes: Electronic Signature(s) Signed: 11/26/2018 2:58:29 PM By: Montey Hora Entered By: Montey Hora on 11/26/2018 13:37:52 [name] ([id number]) -------------------------------------------------------------------------------- Pain Assessment Details Patient Name: [name], SOL. Date of Service: 11/26/2018 1:00 PM Medical Record Number: [id number] Patient Account Number: [id number] Date of Birth/Sex: [birth year] (68 y.o. F) Treating RN: Harold Barban Primary Care Qusai Kem: Crist Infante Other Clinician: Referring Krislynn Gronau: Crist Infante Treating Jamaul Heist/Extender: Melburn Hake, [name] Weeks in Treatment: 10 Active Problems Location of Pain Severity and Description of Pain Patient Has Paino No Site Locations Pain Management and Medication Current Pain Management: Electronic Signature(s) Signed: 12/02/2018 8:41:29 AM By: Harold Barban Entered By: Harold Barban on 11/26/2018 13:17:43 [name] ([id number]) -------------------------------------------------------------------------------- Patient/Caregiver Education Details Patient Name: [name], [name]. Date  of Service: 11/26/2018 1:00 PM Medical Record Number: [id number] Patient Account Number: [id number] Date of Birth/Gender: 05/10/1951 (68 y.o. F) Treating RN: Montey Hora Primary Care Physician: Crist Infante Other Clinician: Referring Physician: Crist Infante Treating Physician/Extender: [name] in Treatment: 10 Education Assessment Education Provided To: Patient Education Topics Provided Wound/Skin Impairment: Handouts: Other: wound care as ordered Methods:  Demonstration, Explain/Verbal Responses: State content correctly Electronic Signature(s) Signed: 11/26/2018 2:58:29 PM By: Montey Hora Entered By: Montey Hora on 11/26/2018 13:44:16 [name], [name] ([id number]) -------------------------------------------------------------------------------- Wound Assessment Details Patient Name: [name], [name]. Date of Service: 11/26/2018 1:00 PM Medical Record Number: [id number] Patient Account Number: [id number] Date of Birth/Sex: [birth year] (68 y.o. F) Treating RN: Harold Barban Primary Care Imo Cumbie: Crist Infante Other Clinician: Referring Trysta Showman: Crist Infante Treating [name]/Extender: Melburn Hake, [name] Weeks in Treatment: 10 Wound Status Wound Number: 1 Primary Auto-immune Etiology: Wound Location: Right Lower Leg - Lateral Wound Open Wounding Event: Bump Status: Date Acquired: 08/01/2018 Comorbid Arrhythmia, Deep Vein Thrombosis, Weeks Of Treatment: 10 History: Hypertension, Osteoarthritis, Received Clustered Wound: No Chemotherapy Photos Wound Measurements Length: (cm) 3.5 Width: (cm) 3 Depth: (cm) 0.2 Area: (cm) 8.247 Volume: (cm) 1.649 % Reduction in Area: -90.5% % Reduction in Volume: 23.8% Epithelialization: None Tunneling: No Undermining: No Wound Description Full Thickness Without Exposed Support Foul Odo Classification: Structures Slough/F Wound Margin: Flat and Intact Exudate Large Amount: Exudate Type: Serosanguineous Exudate Color: red, brown r After Cleansing: No ibrino Yes Wound Bed Granulation Amount: None Present (0%) Exposed Structure Necrotic Amount: Large (67-100%) Fascia Exposed: No Necrotic Quality: Eschar, Adherent Slough Fat Layer (Subcutaneous Tissue) Exposed: Yes Tendon Exposed: No Muscle Exposed: No Joint Exposed: No Bone Exposed: No Myszka, [name] ([id number]) Periwound Skin Texture Texture Color No Abnormalities Noted: No No Abnormalities Noted: No Callus: No Atrophie  Blanche: No Crepitus: No Cyanosis: No Excoriation: Yes Ecchymosis: Yes Induration: Yes Erythema: Yes Rash: No Erythema Location: Circumferential Scarring: No Hemosiderin Staining: No Mottled: No Moisture Pallor: No No Abnormalities Noted: No Rubor: No Dry / Scaly: No Maceration: No Temperature / Pain Temperature: No Abnormality Tenderness on Palpation: Yes Treatment Notes Wound #1 (Right, Lateral Lower Leg) Notes Prisma Ag, gauze, conform, tubigrip E Electronic Signature(s) Signed: 12/02/2018 8:41:29 AM By: Harold Barban Entered By: Harold Barban on 11/26/2018 13:27:59 [name] ([id number]) -------------------------------------------------------------------------------- Vitals Details Patient Name: [name] Date of Service: 11/26/2018 1:00 PM Medical Record Number: [id number] Patient Account Number: [id number] Date of Birth/Sex: 01-25-1951 (68 y.o. F) Treating RN: Harold Barban Primary Care Donavin Audino: Crist Infante Other Clinician: Referring Analycia Khokhar: Crist Infante Treating Stanislaus Kaltenbach/Extender: Melburn Hake, [name] Weeks in Treatment: 10 Vital Signs Time Taken: 13:15 Temperature (F): 98.6 Height (in): 63 Pulse (bpm): 87 Weight (lbs): 174 Respiratory Rate (breaths/min): 18 Body Mass Index (BMI): 30.8 Blood Pressure (mmHg): 144/67 Reference Range: 80 - 120 mg / dl Electronic Signature(s) Signed: 12/02/2018 8:41:29 AM By: Harold Barban Entered By: Harold Barban on 11/26/2018 13:18:01

## 2018-12-03 ENCOUNTER — Encounter: Payer: Medicare Other | Admitting: Physician Assistant

## 2018-12-03 ENCOUNTER — Other Ambulatory Visit: Payer: Self-pay

## 2018-12-03 DIAGNOSIS — M359 Systemic involvement of connective tissue, unspecified: Secondary | ICD-10-CM | POA: Diagnosis not present

## 2018-12-03 DIAGNOSIS — I10 Essential (primary) hypertension: Secondary | ICD-10-CM | POA: Diagnosis not present

## 2018-12-03 DIAGNOSIS — Z7901 Long term (current) use of anticoagulants: Secondary | ICD-10-CM | POA: Diagnosis not present

## 2018-12-03 DIAGNOSIS — M313 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: Secondary | ICD-10-CM | POA: Diagnosis not present

## 2018-12-03 DIAGNOSIS — Z86718 Personal history of other venous thrombosis and embolism: Secondary | ICD-10-CM | POA: Diagnosis not present

## 2018-12-03 DIAGNOSIS — L97812 Non-pressure chronic ulcer of other part of right lower leg with fat layer exposed: Secondary | ICD-10-CM | POA: Diagnosis not present

## 2018-12-03 DIAGNOSIS — I480 Paroxysmal atrial fibrillation: Secondary | ICD-10-CM | POA: Diagnosis not present

## 2018-12-03 NOTE — Progress Notes (Signed)
MARGE, VANDERMEULEN ([id number]) Visit Report for 12/03/2018 Chief Complaint Document Details Patient Name: [name], [name]. Date of Service: 12/03/2018 8:45 AM Medical Record Number: [id number] Patient Account Number: [id number] Date of Birth/Sex: 1951-02-16 (68 y.o. F) Treating RN: Montey Hora Primary Care Provider: Crist Infante Other Clinician: Referring Provider: Crist Infante Treating Provider/Extender: Melburn Hake, HOYT Weeks in Treatment: 11 Information Obtained from: Patient Chief Complaint Right LE Ulcer Electronic Signature(s) Signed: 12/03/2018 1:21:29 PM By: Worthy Keeler PA-C Entered By: Worthy Keeler on 12/03/2018 08:54:26 Erdahl, [name] ([id number]) -------------------------------------------------------------------------------- Cellular or Tissue Based Product Details Patient Name: EMBREE, BRAWLEY. Date of Service: 12/03/2018 8:45 AM Medical Record Number: [id number] Patient Account Number: [id number] Date of Birth/Sex: 09-Feb-1951 (68 y.o. F) Treating RN: Montey Hora Primary Care Provider: Crist Infante Other Clinician: Referring Provider: Crist Infante Treating Provider/Extender: Melburn Hake, HOYT Weeks in Treatment: 11 Cellular or Tissue Based Wound #1 Right,Lateral Lower Leg Product Type Applied to: Performed By: Physician STONE III, HOYT E., PA-C Cellular or Tissue Based Epifix Product Type: Level of Consciousness (Pre- Awake and Alert procedure): Pre-procedure Verification/Time Yes - 09:08 Out Taken: Location: trunk / arms / legs Wound Size (sq cm): 1.68 Product Size (sq cm): 4 Waste Size (sq cm): 0 Amount of Product Applied (sq cm): 4 Instrument Used: Forceps, Scissors Lot #: [id number] Expiration Date: 08/22/2023 Fenestrated: No Reconstituted: Yes Solution Type: normal saline Solution Amount: 5ML Lot #: [id number] Solution Expiration Date: 07/21/2020 Secured: Yes Secured With: Steri-Strips Dressing Applied: Yes Primary Dressing: mepitel  one Procedural Pain: 0 Post Procedural Pain: 0 Response to Treatment: Procedure was tolerated well Level of Consciousness (Post- Awake and Alert procedure): Post Procedure Diagnosis Same as Pre-procedure Electronic Signature(s) Signed: 12/03/2018 12:59:05 PM By: Montey Hora Entered By: Montey Hora on 12/03/2018 [place], [place] ([id number]) -------------------------------------------------------------------------------- Debridement Details Patient Name: [name]. Date of Service: 12/03/2018 8:45 AM Medical Record Number: [id number] Patient Account Number: [id number] Date of Birth/Sex: 05/21/1951 (68 y.o. F) Treating RN: Montey Hora Primary Care Provider: Crist Infante Other Clinician: Referring Provider: Crist Infante Treating Provider/Extender: Melburn Hake, HOYT Weeks in Treatment: 11 Debridement Performed for Wound #1 Right,Lateral Lower Leg Assessment: Performed By: Physician STONE III, HOYT E., PA-C Debridement Type: Debridement Level of Consciousness (Pre- Awake and Alert procedure): Pre-procedure Verification/Time Yes - 09:02 Out Taken: Start Time: 09:02 Pain Control: Lidocaine 4% Topical Solution Total Area Debrided (L x W): 1.4 (cm) x 1.2 (cm) = 1.68 (cm) Tissue and other material Viable, Non-Viable, Slough, Subcutaneous, Slough debrided: Level: Skin/Subcutaneous Tissue Debridement Description: Excisional Instrument: Curette Bleeding: Minimum Hemostasis Achieved: Pressure End Time: 09:05 Procedural Pain: 0 Post Procedural Pain: 0 Response to Treatment: Procedure was tolerated well Level of Consciousness Awake and Alert (Post-procedure): Post Debridement Measurements of Total Wound Length: (cm) 1.4 Width: (cm) 1.2 Depth: (cm) 0.4 Volume: (cm) 0.528 Character of Wound/Ulcer Post Debridement: Improved Post Procedure Diagnosis Same as Pre-procedure Electronic Signature(s) Signed: 12/03/2018 12:59:05 PM By: Montey Hora Signed:  12/03/2018 1:21:29 PM By: Worthy Keeler PA-C Entered By: Montey Hora on 12/03/2018 09:07:59 [name] ([id number]) -------------------------------------------------------------------------------- HPI Details Patient Name: WHITNI, PASQUINI. Date of Service: 12/03/2018 8:45 AM Medical Record Number: [id number] Patient Account Number: [id number] Date of Birth/Sex: 1951/02/17 (67 y.o. 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 She states that she often has areas like this that will come up but normally not to the severity. She's typically able to take care of these herself. However she's been having a lot of trouble since this past summer in particular. 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 She was most recently treated with this by her primary care provider. Fortunately that seems to have cleared up any infection that she may have had. Nonetheless other pertinent medical history includes the chronic long-term use of Eliquis secondary to atrial fibrillation. Her ABI appear to be okay today. She did have a DVT 13 years ago and this lag but has not had anything recently. The Eliquis seems to be beneficial in this regard. Otherwise there's no evidence of active infection at this time. No fevers, chills, nausea, or vomiting noted at this time. 09/19/18 on evaluation today patient presents for follow-up concerning her ongoing lower extremity wound. This is her second visit here in our clinic. She does have slough covering the surface of the wound today. Fortunately there is no signs  of infection at this time. Overall I have been pleased with the interval change although she still has a lot of Slough I do feel like the Iodoflex was of benefit for her. We did have to change her wrap once in the weeks since I last saw her fortunately after that changed nothing seem to smell or drain through. 09/27/18 on evaluation today patient appears to be doing better in regard to her right lateral lower extremity ulcer. She's been tolerating the dressing changes without complication. Fortunately there is no sign of infection at this time. With that being said we have had to change the wrap at least once in the interim between when we see her and when she comes back in for evaluation due to the amount of drainage an odor. With that being said she is getting very frustrated with the length of time is taken get this to heal. Fortunately there is no evidence of anything worsening and a lot of the slough is improving she has good granulation seems to be peeking out from underneath. She is still having discomfort unfortunately. 10/03/18 on evaluation today patient's wound actually appears to be doing better from the standpoint of feeling in. With that being said she actually does have some slough covering the surface of the wound unfortunately. This is going to require sharp debridement prior to application of EpiFix. 10/15/18 on evaluation today patient actually appears to be doing much better in regard to her wound. I do believe that the EpiFix has been of benefit for her which is excellent news. Overall I think that this is something I would definitely recommend continuing I've seen definite progress. 10/22/18 on evaluation today patient actually appears to be doing very well in regard to her leg ulcer. This is shown signs of excellent improvement measurement wise she is continuing to week by week make great progress in my pinion. Overall very pleased in this regard. Fortunately there's no signs of  infection at this time. I do believe that the EpiFix has been beneficial for her. 10/29/18 on evaluation today patient actually appears to be doing very well today. The EpiFix does seem to be benefiting her as far as the overall improvement we have seen is concerned. Fortunately there's no signs of infection at this time  and again her pain is much less than what it used to be. Overall I feel like she's making excellent progress. 11/12/18 on evaluation today patient presents after unfortunately having had a stroke since the last time I seen her until today. She tells me that she's actually doing very well in this regard she did have some rehab in the hospital and then subsequently was discharged to home. She is home at this point. Nonetheless she has physical therapy a potential occupational therapy is gonna be coming out to see her. Nonetheless she tells me that she is not having any evidence at this time of active infection which is good news she's not having any evidence of pain either in regard to the right lateral lower extremity. Overall I feel like she has done well as far as I'm concerned in the EpiFix which did stay in place for longer than the week obviously I think still have good effect based on what I'm seeing at this point the wound is significantly smaller. ROMELIA, BROMELL ([id number]) 11/18/18 on evaluation today patient comes in one day early for evaluation here in our clinic due to issues that she's having with her right lower extremity she states she was having a lot of discomfort and could not wait any longer to have this checked. Fortunately she did come in because she's actually having what appears to be significant infection which needs to be addressed I did not want this to continue without being addressed. No fevers, chills, nausea, or vomiting noted at this time. 11/26/18 on evaluation today patient will significantly better compared to last week regarding her lower extremity  ulcer. Last week she had an infection we finally did get the results of the culture back which did show methicillin-resistant Staphylococcus aureus as the positive organism. Subsequently the antibiotic that she's been on has completely resolved the infection as best I can tell she seems to be doing absolutely excellent today. I'm very pleased in this regard. She's having little to no drainage and no pain. Last week and this was very painful. 12/03/18 on evaluation today patient appears to be doing rather well in regard to her lateral lower extremity ulcer on the right. She's been tolerating the dressing changes without complication. Fortunately the wound is shown signs of good improvement this week. The infection is in your much better control and I think were making excellent progress. Electronic Signature(s) Signed: 12/03/2018 1:21:29 PM By: Worthy Keeler PA-C Entered By: Worthy Keeler on 12/03/2018 09:32:00 [name] ([id number]) -------------------------------------------------------------------------------- Physical Exam Details Patient Name: KEUNDRA, PETRUCELLI. Date of Service: 12/03/2018 8:45 AM Medical Record Number: [id number] Patient Account Number: [id number] Date of Birth/Sex: May 22, 1951 (68 y.o. F) Treating RN: Montey Hora Primary Care Provider: Crist Infante Other Clinician: Referring Provider: Crist Infante Treating Provider/Extender: STONE III, HOYT Weeks in Treatment: 59 Constitutional Well-nourished and well-hydrated in no acute distress. Respiratory normal breathing without difficulty. Psychiatric this patient is able to make decisions and demonstrates good insight into disease process. Alert and Oriented x 3. pleasant and cooperative. Notes Patient's wound bed currently showed signs of good granulation that was some Slough noted on the surface of the wound which part sharp debridement to remove this buildup prior to application the EpiFix which we are going to go  ahead and apply today. She tolerated this debridement will only minimal discomfort post debridement wound bed appears to be doing much better which is excellent news. I then did proceed to apply the EpiFix  followed by mepitel. Electronic Signature(s) Signed: 12/03/2018 1:21:29 PM By: Worthy Keeler PA-C Entered By: Worthy Keeler on 12/03/2018 09:32:39 [name] ([id number]) -------------------------------------------------------------------------------- Physician Orders Details Patient Name: YARI, SZELIGA. Date of Service: 12/03/2018 8:45 AM Medical Record Number: [id number] Patient Account Number: [id number] Date of Birth/Sex: [birth year] (68 y.o. 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 o May shower with protection. - Please do not get your wrap wet Anesthetic (add to Medication List) Wound #1 Right,Lateral Lower Leg o Topical Lidocaine 4% cream applied to wound bed prior to debridement (In Clinic Only). o Benzocaine Topical Anesthetic Spray applied to wound bed prior to debridement (In Clinic Only). Primary Wound Dressing Wound #1 Right,Lateral Lower Leg o Other: - EpiFix Secondary Dressing Wound #1 Right,Lateral Lower Leg o ABD pad o Conform/Kerlix - Rolled conform to secure Dressing Change Frequency Wound #1 Right,Lateral Lower Leg o Other: - as needed for drainage Follow-up  Appointments Wound #1 Right,Lateral Lower Leg o Return Appointment in 1 week. Edema Control Wound #1 Right,Lateral Lower Leg o Elevate legs to the level of the heart and pump ankles as often as possible o Other: - Tubi Grip E ANQUINETTE, PIERRO ([id number]) Advanced Therapies Wound #1 Right,Lateral Lower Leg o EpiFix application in clinic; including contact layer, fixation with steri strips, dry gauze and cover dressing. - no steri strips used Electronic Signature(s) Signed: 12/03/2018 12:59:05 PM By: Montey Hora Signed: 12/03/2018 1:21:29 PM By: Worthy Keeler PA-C Entered By: Montey Hora on 12/03/2018 09:13:49 [name] ([id number]) -------------------------------------------------------------------------------- Problem List Details Patient Name: TEMEKIA, CASKEY. Date of Service: 12/03/2018 8:45 AM Medical Record Number: [id number] Patient Account Number: [id number] Date of Birth/Sex: 1951/04/14 (68 y.o. 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 ([id number]) -------------------------------------------------------------------------------- Progress Note Details Patient Name: [name] Date of Service:  12/03/2018 8:45 AM Medical Record Number: [id number] Patient Account Number: [id number] Date of Birth/Sex: 04-Jun-1951 (68 y.o. 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 She states that she often has areas like this that will come up but normally not to the severity. She's typically able  to take care of these herself. However she's been having a lot of trouble since this past summer in particular. 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 She was most recently treated with this by her primary care provider. Fortunately that seems to have cleared up any infection that she may have had. Nonetheless other pertinent medical history includes the chronic long-term use of Eliquis secondary to atrial fibrillation. Her ABI appear to be okay today. She did have a DVT 13 years ago and this lag but has not had anything recently. The Eliquis seems to be beneficial in this regard. Otherwise there's no evidence of active infection at this time. No fevers, chills, nausea, or vomiting noted at this time. 09/19/18 on evaluation today patient presents for follow-up concerning her ongoing lower extremity wound. This is her second visit here in our clinic. She does have slough covering the surface of the wound today. Fortunately there is no signs of infection at this time. Overall I have been pleased with the interval change although she still has a lot of Slough I do feel like the Iodoflex was of benefit for her. We did have to change her wrap  once in the weeks since I last saw her fortunately after that changed nothing seem to smell or drain through. 09/27/18 on evaluation today patient appears to be doing better in regard to her right lateral lower extremity ulcer. She's been tolerating the dressing changes without complication. Fortunately there is no sign of infection at this time. With that being said we have had to change the wrap at least once in the interim between when we see her and when she comes back in for evaluation due to the amount of drainage an odor. With that being said she is getting very frustrated with the length of time is taken get this to heal. Fortunately there is no evidence of anything worsening and a lot of the slough is improving she has good granulation seems to be peeking out from underneath. She is still having discomfort unfortunately. 10/03/18 on evaluation today patient's wound actually appears to be doing better from the standpoint of feeling in. With that being said she actually does have some slough covering the surface of the wound unfortunately. This is going to require sharp debridement prior to application of EpiFix. 10/15/18 on evaluation today patient actually appears to be doing much better in regard to her wound. I do believe that the EpiFix has been of benefit for her which is excellent news. Overall I think that this is something I would definitely recommend continuing I've seen definite progress. 10/22/18 on evaluation today patient actually appears to be doing very well in regard to her leg ulcer. This is shown signs of excellent improvement measurement wise she is continuing to week by week make great progress in my pinion. Overall very pleased in this regard. Fortunately there's no signs of infection at this time. I do believe that the EpiFix has been beneficial for her. 10/29/18 on evaluation today patient actually appears to be doing very well today. The EpiFix does seem to be benefiting  her as far as the overall improvement we have seen is concerned. Fortunately there's no signs of infection at this time and again her pain is much less than what it used to be. Overall I feel like she's making excellent progress. 11/12/18 on evaluation today patient presents after unfortunately having had a stroke  since the last time I seen her until today. MAKENNAH, OMURA ([id number]) She tells me that she's actually doing very well in this regard she did have some rehab in the hospital and then subsequently was discharged to home. She is home at this point. Nonetheless she has physical therapy a potential occupational therapy is gonna be coming out to see her. Nonetheless she tells me that she is not having any evidence at this time of active infection which is good news she's not having any evidence of pain either in regard to the right lateral lower extremity. Overall I feel like she has done well as far as I'm concerned in the EpiFix which did stay in place for longer than the week obviously I think still have good effect based on what I'm seeing at this point the wound is significantly smaller. 11/18/18 on evaluation today patient comes in one day early for evaluation here in our clinic due to issues that she's having with her right lower extremity she states she was having a lot of discomfort and could not wait any longer to have this checked. Fortunately she did come in because she's actually having what appears to be significant infection which needs to be addressed I did not want this to continue without being addressed. No fevers, chills, nausea, or vomiting noted at this time. 11/26/18 on evaluation today patient will significantly better compared to last week regarding her lower extremity ulcer. Last week she had an infection we finally did get the results of the culture back which did show methicillin-resistant Staphylococcus aureus as the positive organism. Subsequently the antibiotic that  she's been on has completely resolved the infection as best I can tell she seems to be doing absolutely excellent today. I'm very pleased in this regard. She's having little to no drainage and no pain. Last week and this was very painful. 12/03/18 on evaluation today patient appears to be doing rather well in regard to her lateral lower extremity ulcer on the right. She's been tolerating the dressing changes without complication. Fortunately the wound is shown signs of good improvement this week. The infection is in your much better control and I think were making excellent progress. Patient History Information obtained from Patient. Family History Cancer - Father, Diabetes - Maternal Grandparents, Lung Disease - Father, Stroke - Father, No family history of Heart Disease, Hereditary Spherocytosis, Hypertension, Kidney Disease, Seizures, Thyroid Problems, Tuberculosis. Social History Never smoker, Marital Status - Married, Alcohol Use - Never, Drug Use - No History, Caffeine Use - Daily. Medical History Eyes Denies history of Cataracts, Glaucoma, Optic Neuritis Ear/Nose/Mouth/Throat Denies history of Chronic sinus problems/congestion, Middle ear problems Hematologic/Lymphatic Denies history of Anemia, Hemophilia, Human Immunodeficiency Virus, Sickle Cell Disease Respiratory Denies history of Aspiration, Asthma, Chronic Obstructive Pulmonary Disease (COPD), Pneumothorax, Sleep Apnea, Tuberculosis Cardiovascular Patient has history of Arrhythmia - a fib, Deep Vein Thrombosis - 13 years ago, Hypertension Denies history of Angina, Congestive Heart Failure, Coronary Artery Disease, Hypotension, Myocardial Infarction, Peripheral Arterial Disease, Peripheral Venous Disease, Phlebitis, Vasculitis Gastrointestinal Denies history of Cirrhosis , Colitis, Crohn s, Hepatitis A, Hepatitis B, Hepatitis C Endocrine Denies history of Type I Diabetes, Type II Diabetes Genitourinary Denies history of  End Stage Renal Disease Immunological Denies history of Lupus Erythematosus, Raynaud s, Scleroderma Integumentary (Skin) Denies history of History of Burn, History of pressure wounds Musculoskeletal Patient has history of Osteoarthritis HONI, NAME ([id number]) Denies history of Gout, Rheumatoid Arthritis, Osteomyelitis Neurologic Denies history of Dementia, Neuropathy,  Quadriplegia, Paraplegia, Seizure Disorder Oncologic Patient has history of Received Chemotherapy - past for Wegeners Denies history of Received Radiation Psychiatric Denies history of Anorexia/bulimia, Confinement Anxiety Medical And Surgical History Notes Immunological Wegeners Musculoskeletal OP Review of Systems (ROS) Constitutional Symptoms (General Health) Denies complaints or symptoms of Fatigue, Fever, Chills, Marked Weight Change. Respiratory Denies complaints or symptoms of Chronic or frequent coughs, Shortness of Breath. Cardiovascular Denies complaints or symptoms of Chest pain, LE edema. Psychiatric Denies complaints or symptoms of Anxiety, Claustrophobia. Objective Constitutional Well-nourished and well-hydrated in no acute distress. Vitals Time Taken: 8:50 AM, Height: 63 in, Weight: 174 lbs, BMI: 30.8, Temperature: 98.3 F, Pulse: 72 bpm, Respiratory Rate: 16 breaths/min, Blood Pressure: 139/77 mmHg. Respiratory normal breathing without difficulty. Psychiatric this patient is able to make decisions and demonstrates good insight into disease process. Alert and Oriented x 3. pleasant and cooperative. General Notes: Patient's wound bed currently showed signs of good granulation that was some Slough noted on the surface of the wound which part sharp debridement to remove this buildup prior to application the EpiFix which we are going to go ahead and apply today. She tolerated this debridement will only minimal discomfort post debridement wound bed appears to be doing much better which is  excellent news. I then did proceed to apply the EpiFix followed by mepitel. Integumentary (Hair, Skin) Wound #1 status is Open. Original cause of wound was Bump. The wound is located on the Right,Lateral Lower Leg. The wound measures 1.4cm length x 1.2cm width x 0.3cm depth; 1.319cm^2 area and 0.396cm^3 volume. There is Fat Layer (Subcutaneous Tissue) Exposed exposed. There is no tunneling or undermining noted. There is a small amount of serous ROMANITA, FAGER. ([id number]) drainage noted. The wound margin is flat and intact. There is small (1-33%) pink, hyper - granulation within the wound bed. There is a large (67-100%) amount of necrotic tissue within the wound bed including Adherent Slough. The periwound skin appearance exhibited: Excoriation, Induration, Ecchymosis, Erythema. The periwound skin appearance did not exhibit: Callus, Crepitus, Rash, Scarring, Dry/Scaly, Maceration, Atrophie Blanche, Cyanosis, Hemosiderin Staining, Mottled, Pallor, Rubor. The surrounding wound skin color is noted with erythema which is circumferential. Periwound temperature was noted as No Abnormality. The periwound has tenderness on palpation. 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 There was a Excisional Skin/Subcutaneous Tissue Debridement with a total area of 1.68 sq cm performed by STONE III, HOYT E., PA-C. With the following instrument(s): Curette to remove Viable and Non-Viable tissue/material. Material removed includes Subcutaneous Tissue and Slough and after achieving pain control using Lidocaine 4% Topical Solution. No specimens were taken. A time out was  conducted at 09:02, prior to the start of the procedure. A Minimum amount of bleeding was controlled with Pressure. The procedure was tolerated well with a pain level of 0 throughout and a pain level of 0 following the procedure. Post Debridement Measurements: 1.4cm length x 1.2cm width x 0.4cm depth; 0.528cm^3 volume. Character of Wound/Ulcer Post Debridement is improved. 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 A skin graft procedure using a bioengineered skin substitute/cellular or tissue based product was performed by STONE III, HOYT E.,  PA-C with the following instrument(s): Forceps and Scissors. Epifix was applied and secured with Steri-Strips. 4 sq cm of product was utilized and 0 sq cm was wasted. Post Application, mepitel one was applied. A Time Out was conducted at 09:08, prior to the start of the procedure. The procedure was tolerated well with a pain level of 0 throughout and a pain level of 0 following the procedure. Post procedure Diagnosis Wound #1: Same as Pre-Procedure . Plan Wound Cleansing: Wound #1 Right,Lateral Lower Leg: Clean wound with Normal Saline. ALEYA, DURNELL ([id number]) May shower with protection. - Please do not get your wrap wet Anesthetic (add to Medication List): Wound #1 Right,Lateral Lower Leg: Topical Lidocaine 4% cream applied to wound bed prior to debridement (In Clinic Only). Benzocaine Topical Anesthetic Spray applied to wound bed prior to debridement (In Clinic Only). Primary Wound Dressing: Wound #1 Right,Lateral Lower Leg: Other: - EpiFix Secondary Dressing: Wound #1 Right,Lateral Lower Leg: ABD pad Conform/Kerlix - Rolled conform to secure Dressing Change Frequency: Wound #1 Right,Lateral Lower Leg: Other: - as needed for drainage Follow-up Appointments: Wound #1 Right,Lateral Lower Leg: Return Appointment in 1 week. Edema  Control: Wound #1 Right,Lateral Lower Leg: Elevate legs to the level of the heart and pump ankles as often as possible Other: - Tubi Grip E Advanced Therapies: Wound #1 Right,Lateral Lower Leg: EpiFix application in clinic; including contact layer, fixation with steri strips, dry gauze and cover dressing. - no steri strips used At this point we're not going to order a replacement EpiFix for the patient due to the fact that I'm not sure that it seemed gonna be necessary going forward she has done very well with the Prisma although I did want to apply one more EpiFix to the area to try to get this to decrease in size this will likely be the last application. She's in agreement with this plan. We will see were things stand at follow-up in one weeks time. If anything changes or worsens in the meantime she will contact the office let me know. Otherwise my hope is that she will continue to show signs of good improvement over the next week. If we do need to order additional EpiFix going forward we can always do that next week for the following application week. Please see above for specific wound care orders. We will see patient for re-evaluation in 1 week(s) here in the clinic. If anything worsens or changes patient will contact our office for additional recommendations. Electronic Signature(s) Signed: 12/03/2018 1:21:29 PM By: Worthy Keeler PA-C Entered By: Worthy Keeler on 12/03/2018 09:34:44 [name] ([id number]) -------------------------------------------------------------------------------- ROS/PFSH Details Patient Name: CAILEN, TEXEIRA. Date of Service: 12/03/2018 8:45 AM Medical Record Number: [id number] Patient Account Number: [id number] Date of Birth/Sex: Mar 09, 1951 (68 y.o. F) Treating RN: Montey Hora Primary Care Provider: Crist Infante Other Clinician: Referring Provider: Crist Infante Treating Provider/Extender: Melburn Hake, HOYT Weeks in Treatment: 11 Information Obtained  From Patient Constitutional Symptoms (General Health) Complaints and Symptoms: Negative for: Fatigue; Fever; Chills; Marked Weight Change Respiratory Complaints and Symptoms: Negative for: Chronic or frequent coughs; Shortness of Breath Medical History: Negative for: Aspiration; Asthma; Chronic Obstructive Pulmonary Disease (COPD); Pneumothorax; Sleep Apnea; Tuberculosis Cardiovascular Complaints and Symptoms: Negative for: Chest pain; LE edema Medical History: Positive for: Arrhythmia - a fib; Deep Vein Thrombosis - 13 years ago; Hypertension Negative for: Angina; Congestive Heart Failure; Coronary Artery Disease; Hypotension; Myocardial Infarction; Peripheral Arterial Disease; Peripheral Venous Disease; Phlebitis; Vasculitis Psychiatric Complaints and  Symptoms: Negative for: Anxiety; Claustrophobia Medical History: Negative for: Anorexia/bulimia; Confinement Anxiety Eyes Medical History: Negative for: Cataracts; Glaucoma; Optic Neuritis Ear/Nose/Mouth/Throat Medical History: Negative for: Chronic sinus problems/congestion; Middle ear problems Hematologic/Lymphatic Medical History: Negative for: Anemia; Hemophilia; Human Immunodeficiency Virus; Sickle Cell Disease MARYLYNNE, KEELIN ([id number]) Gastrointestinal Medical History: Negative for: Cirrhosis ; Colitis; Crohnos; Hepatitis A; Hepatitis B; Hepatitis C Endocrine Medical History: Negative for: Type I Diabetes; Type II Diabetes Genitourinary Medical History: Negative for: End Stage Renal Disease Immunological Medical History: Negative for: Lupus Erythematosus; Raynaudos; Scleroderma Past Medical History Notes: Wegeners Integumentary (Skin) Medical History: Negative for: History of Burn; History of pressure wounds Musculoskeletal Medical History: Positive for: Osteoarthritis Negative for: Gout; Rheumatoid Arthritis; Osteomyelitis Past Medical History Notes: OP Neurologic Medical History: Negative for:  Dementia; Neuropathy; Quadriplegia; Paraplegia; Seizure Disorder Oncologic Medical History: Positive for: Received Chemotherapy - past for Wegeners Negative for: Received Radiation Immunizations Pneumococcal Vaccine: Received Pneumococcal Vaccination: Yes Implantable Devices No devices added Family and Social History Cancer: Yes - Father; Diabetes: Yes - Maternal Grandparents; Heart Disease: No; Hereditary Spherocytosis: No; Hypertension: No; Kidney Disease: No; Lung Disease: Yes - Father; Seizures: No; Stroke: Yes - Father; Thyroid Problems: No; Tuberculosis: No; Never smoker; Marital Status - Married; Alcohol Use: Never; Drug Use: No History; Caffeine Use: Daily; Financial Concerns: No; Food, Clothing or Shelter Needs: No; Support System Lacking: No; Transportation Concerns: No NADJA, LINA ([id number]) Physician Affirmation I have reviewed and agree with the above information. Electronic Signature(s) Signed: 12/03/2018 12:59:05 PM By: Montey Hora Signed: 12/03/2018 1:21:29 PM By: Worthy Keeler PA-C Entered By: Worthy Keeler on 12/03/2018 09:32:18 FONDA, ROCHON ([id number]) -------------------------------------------------------------------------------- SuperBill Details Patient Name: FRANNIE, SHEDRICK. Date of Service: 12/03/2018 Medical Record Number: [id number] Patient Account Number: [id number] Date of Birth/Sex: 14-Nov-1950 (68 y.o. 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 with fat layer exposed I48.0 Paroxysmal atrial fibrillation I10 Essential (primary) hypertension Z79.01 Long term (current) use of anticoagulants Facility Procedures CPT4 Code  Description: 88891694 Epifix 2x2 cm (4 sq cm) Modifier: Quantity: 4 CPT4 Code Description: [id number] - SKIN SUB GRAFT TRNK/ARM/LEG ICD-10 Diagnosis Description 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

## 2018-12-03 NOTE — Progress Notes (Signed)
AIDEE, LATIMORE ([id number]) Visit Report for 12/03/2018 Arrival Information Details Patient Name: [name], [name]. Date of Service: 12/03/2018 8:45 AM Medical Record Number: [id number] Patient Account Number: [id number] Date of Birth/Sex: [birth year] (68 y.o. F) Treating RN: Cornell Barman Primary Care Kyo Cocuzza: Crist Infante Other Clinician: Referring Jameya Pontiff: Crist Infante Treating Nakema Fake/Extender: Melburn Hake, HOYT Weeks in Treatment: 11 Visit Information History Since Last Visit Added or deleted any medications: No Patient Arrived: Ambulatory Any new allergies or adverse reactions: No Arrival Time: 08:49 Had a fall or experienced change in No Accompanied By: self activities of daily living that may affect Transfer Assistance: None risk of falls: Patient Identification Verified: Yes Signs or symptoms of abuse/neglect since last visito No Secondary Verification Process Yes Hospitalized since last visit: No Completed: Implantable device outside of the clinic excluding No Patient Has Alerts: Yes cellular tissue based products placed in the center Patient Alerts: Patient on Blood since last visit: Thinner Has Dressing in Place as Prescribed: Yes Eliquis Pain Present Now: No Electronic Signature(s) Signed: 12/03/2018 4:24:58 PM By: Gretta Cool, BSN, RN, CWS, [name] RN, BSN Entered By: Gretta Cool, BSN, RN, CWS, [name] on 12/03/2018 08:50:13 [name] ([id number]) -------------------------------------------------------------------------------- Encounter Discharge Information Details Patient Name: DORAINE, SCHEXNIDER. Date of Service: 12/03/2018 8:45 AM Medical Record Number: [id number] Patient Account Number: [id number] Date of Birth/Sex: September 13, 1950 (68 y.o. F) Treating RN: Montey Hora Primary Care Kennth Vanbenschoten: Crist Infante Other Clinician: Referring Lolitha Tortora: Crist Infante Treating Harlene Petralia/Extender: Melburn Hake, HOYT Weeks in Treatment: 11 Encounter Discharge Information Items Post Procedure  Vitals Discharge Condition: Stable Temperature (F): 98.3 Ambulatory Status: Ambulatory Pulse (bpm): 72 Discharge Destination: Home Respiratory Rate (breaths/min): 16 Transportation: Private Auto Blood Pressure (mmHg): 139/77 Accompanied By: self Schedule Follow-up Appointment: Yes Clinical Summary of Care: Electronic Signature(s) Signed: 12/03/2018 12:59:05 PM By: Montey Hora Entered By: Montey Hora on 12/03/2018 09:15:30 [name] ([id number]) -------------------------------------------------------------------------------- Lower Extremity Assessment Details Patient Name: KAMYA, WATLING. Date of Service: 12/03/2018 8:45 AM Medical Record Number: [id number] Patient Account Number: [id number] Date of Birth/Sex: 1950/12/26 (68 y.o. F) Treating RN: Cornell Barman Primary Care Eissa Buchberger: Crist Infante Other Clinician: Referring Doryan Bahl: Crist Infante Treating Lovene Maret/Extender: Melburn Hake, HOYT Weeks in Treatment: 11 Edema Assessment Assessed: [Left: No] [Right: No] Edema: [Left: N] [Right: o] Calf Left: Right: Point of Measurement: 32 cm From Medial Instep cm 35.8 cm Ankle Left: Right: Point of Measurement: 12 cm From Medial Instep cm 23.6 cm Vascular Assessment Pulses: Dorsalis Pedis Palpable: [Right:Yes] Electronic Signature(s) Signed: 12/03/2018 4:24:58 PM By: Gretta Cool, BSN, RN, CWS, [name] RN, BSN Entered By: Gretta Cool, BSN, RN, CWS, [name] on 12/03/2018 08:56:31 Chiu, Ulyses Southward ([id number]) -------------------------------------------------------------------------------- Multi Wound Chart Details Patient Name: NORRIS, BODLEY. Date of Service: 12/03/2018 8:45 AM Medical Record Number: [id number] Patient Account Number: [id number] Date of Birth/Sex: 01/16/1951 (68 y.o. F) Treating RN: Montey Hora Primary Care Jibran Crookshanks: Crist Infante Other Clinician: Referring Exavier Lina: Crist Infante Treating Lissy Deuser/Extender: Melburn Hake, HOYT Weeks in Treatment: 11 Vital Signs Height(in):  63 Pulse(bpm): 72 Weight(lbs): 174 Blood Pressure(mmHg): 139/77 Body Mass Index(BMI): 31 Temperature(F): 98.3 Respiratory Rate 16 (breaths/min): Photos: [N/A:N/A] Wound Location: Right Lower Leg - Lateral N/A N/A Wounding Event: Bump N/A N/A Primary Etiology: Auto-immune N/A N/A Comorbid History: Arrhythmia, Deep Vein N/A N/A Thrombosis, Hypertension, Osteoarthritis, Received Chemotherapy Date Acquired: 08/01/2018 N/A N/A Weeks of Treatment: 11 N/A N/A Wound Status: Open N/A N/A Measurements L x W x D 1.4x1.2x0.3 N/A N/A (cm) Area (cm) : 1.319 N/A N/A Volume (cm) : 0.396 N/A N/A %  Reduction in Area: 69.50% N/A N/A % Reduction in Volume: 81.70% N/A N/A Classification: Full Thickness Without N/A N/A Exposed Support Structures Exudate Amount: Small N/A N/A Exudate Type: Serous N/A N/A Exudate Color: amber N/A N/A Wound Margin: Flat and Intact N/A N/A Granulation Amount: Small (1-33%) N/A N/A Granulation Quality: Pink, Hyper-granulation N/A N/A Necrotic Amount: Large (67-100%) N/A N/A Exposed Structures: Fat Layer (Subcutaneous N/A N/A Tissue) Exposed: Yes Fascia: No Tendon: No QUINTAVIA, ROGSTAD. ([id number]) Muscle: No Joint: No Bone: No Epithelialization: None N/A N/A Periwound Skin Texture: Excoriation: Yes N/A N/A Induration: Yes Callus: No Crepitus: No Rash: No Scarring: No Periwound Skin Moisture: Maceration: No N/A N/A Dry/Scaly: No Periwound Skin Color: Ecchymosis: Yes N/A N/A Erythema: Yes Atrophie Blanche: No Cyanosis: No Hemosiderin Staining: No Mottled: No Pallor: No Rubor: No Erythema Location: Circumferential N/A N/A Temperature: No Abnormality N/A N/A Tenderness on Palpation: Yes N/A N/A Treatment Notes Electronic Signature(s) Signed: 12/03/2018 12:59:05 PM By: Montey Hora Entered By: Montey Hora on 12/03/2018 09:01:55 [name]  ([id number]) -------------------------------------------------------------------------------- [place] Details Patient Name: VALBORG, FRIAR. Date of Service: 12/03/2018 8:45 AM Medical Record Number: [id number] Patient Account Number: [id number] Date of Birth/Sex: 1950-12-08 (68 y.o. F) Treating RN: Montey Hora Primary Care Nyzir Dubois: Crist Infante Other Clinician: Referring Renelle Stegenga: Crist Infante Treating Tiena Manansala/Extender: Melburn Hake, HOYT Weeks in Treatment: 11 Active Inactive Necrotic Tissue Nursing Diagnoses: Impaired tissue integrity related to necrotic/devitalized tissue Knowledge deficit related to management of necrotic/devitalized tissue Goals: Necrotic/devitalized tissue will be minimized in the wound bed Date Initiated: 09/12/2018 Target Resolution Date: 12/21/2018 Goal Status: Active Interventions: Assess patient pain level pre-, during and post procedure and prior to discharge Provide education on necrotic tissue and debridement process Treatment Activities: Apply topical anesthetic as ordered : 09/12/2018 Notes: Orientation to the Wound Care Program Nursing Diagnoses: Knowledge deficit related to the wound healing center program Goals: Patient/caregiver will verbalize understanding of the [place] Date Initiated: 09/12/2018 Target Resolution Date: 12/21/2018 Goal Status: Active Interventions: Provide education on orientation to the wound center Notes: Pain, Acute or Chronic Nursing Diagnoses: Pain Management - Non-cyclic Acute (Procedural) Goals: Patient will verbalize adequate pain control and receive pain control interventions during procedures as needed SANIYYAH, ELSTER ([id number]) Date Initiated: 09/12/2018 Target Resolution Date: 12/21/2018 Goal Status: Active Interventions: Assess comfort goal upon admission Encourage patient to take pain medications as prescribed Treatment Activities: Administer pain control measures  as ordered : 09/12/2018 Notes: Wound/Skin Impairment Nursing Diagnoses: Impaired tissue integrity Goals: Ulcer/skin breakdown will have a volume reduction of 30% by week 4 Date Initiated: 09/12/2018 Target Resolution Date: 12/21/2018 Goal Status: Active Interventions: Assess patient/caregiver ability to obtain necessary supplies Assess patient/caregiver ability to perform ulcer/skin care regimen upon admission and as needed Assess ulceration(s) every visit Treatment Activities: Referred to DME Joffrey Kerce for dressing supplies : 09/12/2018 Skin care regimen initiated : 09/12/2018 Notes: Electronic Signature(s) Signed: 12/03/2018 12:59:05 PM By: Montey Hora Entered By: Montey Hora on 12/03/2018 09:00:28 [name] ([id number]) -------------------------------------------------------------------------------- Pain Assessment Details Patient Name: MAYE, PARKINSON. Date of Service: 12/03/2018 8:45 AM Medical Record Number: [id number] Patient Account Number: [id number] Date of Birth/Sex: 14-Apr-1951 (68 y.o. F) Treating RN: Cornell Barman Primary Care Gerard Cantara: Crist Infante Other Clinician: Referring Cartina Brousseau: Crist Infante Treating Aadon Gorelik/Extender: Melburn Hake, HOYT Weeks in Treatment: 11 Active Problems Location of Pain Severity and Description of Pain Patient Has Paino No Site Locations Pain Management and Medication Current Pain Management: Notes Patient denies pain at this time. Electronic  Signature(s) Signed: 12/03/2018 4:24:58 PM By: Gretta Cool, BSN, RN, CWS, [name] RN, BSN Entered By: Gretta Cool, BSN, RN, CWS, [name] on 12/03/2018 08:50:37 [name] ([id number]) -------------------------------------------------------------------------------- Patient/Caregiver Education Details Patient Name: CIMONE, FAHEY. Date of Service: 12/03/2018 8:45 AM Medical Record Number: [id number] Patient Account Number: [id number] Date of Birth/Gender: 12-Jul-1951 (68 y.o. F) Treating RN: Montey Hora Primary  Care Physician: Crist Infante Other Clinician: Referring Physician: Crist Infante Treating Physician/Extender: Sharalyn Ink in Treatment: 11 Education Assessment Education Provided To: Patient Education Topics Provided Wound/Skin Impairment: Handouts: Other: wound care if needed Methods: Demonstration, Explain/Verbal Responses: State content correctly Electronic Signature(s) Signed: 12/03/2018 12:59:05 PM By: Montey Hora Entered By: Montey Hora on 12/03/2018 09:14:35 [name] ([id number]) -------------------------------------------------------------------------------- Wound Assessment Details Patient Name: ROSABELLA, EDGIN. Date of Service: 12/03/2018 8:45 AM Medical Record Number: [id number] Patient Account Number: [id number] Date of Birth/Sex: Jan 08, 1951 (68 y.o. F) Treating RN: Cornell Barman Primary Care Kanani Mowbray: Crist Infante Other Clinician: Referring Mamie Hundertmark: Crist Infante Treating Sholanda Croson/Extender: Melburn Hake, HOYT Weeks in Treatment: 11 Wound Status Wound Number: 1 Primary Auto-immune Etiology: Wound Location: Right Lower Leg - Lateral Wound Open Wounding Event: Bump Status: Date Acquired: 08/01/2018 Comorbid Arrhythmia, Deep Vein Thrombosis, Weeks Of Treatment: 11 History: Hypertension, Osteoarthritis, Received Clustered Wound: No Chemotherapy Photos Wound Measurements Length: (cm) 1.4 Width: (cm) 1.2 Depth: (cm) 0.3 Area: (cm) 1.319 Volume: (cm) 0.396 % Reduction in Area: 69.5% % Reduction in Volume: 81.7% Epithelialization: None Tunneling: No Undermining: No Wound Description Full Thickness Without Exposed Support Foul Od Classification: Structures Slough/ Wound Margin: Flat and Intact Exudate Small Amount: Exudate Type: Serous Exudate Color: amber or After Cleansing: No Fibrino Yes Wound Bed Granulation Amount: Small (1-33%) Exposed Structure Granulation Quality: Pink, Hyper-granulation Fascia Exposed: No Necrotic Amount:  Large (67-100%) Fat Layer (Subcutaneous Tissue) Exposed: Yes Necrotic Quality: Adherent Slough Tendon Exposed: No Muscle Exposed: No Joint Exposed: No Bone Exposed: No Uplinger, [name] ([id number]) Periwound Skin Texture Texture Color No Abnormalities Noted: No No Abnormalities Noted: No Callus: No Atrophie Blanche: No Crepitus: No Cyanosis: No Excoriation: Yes Ecchymosis: Yes Induration: Yes Erythema: Yes Rash: No Erythema Location: Circumferential Scarring: No Hemosiderin Staining: No Mottled: No Moisture Pallor: No No Abnormalities Noted: No Rubor: No Dry / Scaly: No Maceration: No Temperature / Pain Temperature: No Abnormality Tenderness on Palpation: Yes Treatment Notes Wound #1 (Right, Lateral Lower Leg) Notes EpiFix, ABD, conform, tubigrip E Electronic Signature(s) Signed: 12/03/2018 4:24:58 PM By: Gretta Cool, BSN, RN, CWS, [name] RN, BSN Entered By: Gretta Cool, BSN, RN, CWS, [name] on 12/03/2018 08:58:13 [name] ([id number]) -------------------------------------------------------------------------------- Vitals Details Patient Name: [name]. Date of Service: 12/03/2018 8:45 AM Medical Record Number: [id number] Patient Account Number: [id number] Date of Birth/Sex: 06-09-1951 (68 y.o. F) Treating RN: Cornell Barman Primary Care Brittani Purdum: Crist Infante Other Clinician: Referring Makinzy Cleere: Crist Infante Treating Ayra Hodgdon/Extender: Melburn Hake, HOYT Weeks in Treatment: 11 Vital Signs Time Taken: 08:50 Temperature (F): 98.3 Height (in): 63 Pulse (bpm): 72 Weight (lbs): 174 Respiratory Rate (breaths/min): 16 Body Mass Index (BMI): 30.8 Blood Pressure (mmHg): 139/77 Reference Range: 80 - 120 mg / dl Electronic Signature(s) Signed: 12/03/2018 4:24:58 PM By: Gretta Cool, BSN, RN, CWS, [name] RN, BSN Entered By: Gretta Cool, BSN, RN, CWS, [name] on 12/03/2018 08:51:07

## 2018-12-09 ENCOUNTER — Other Ambulatory Visit: Payer: Self-pay

## 2018-12-09 ENCOUNTER — Encounter: Payer: Medicare Other | Admitting: Physician Assistant

## 2018-12-09 ENCOUNTER — Inpatient Hospital Stay: Payer: Medicare Other | Admitting: Registered Nurse

## 2018-12-09 DIAGNOSIS — Z7901 Long term (current) use of anticoagulants: Secondary | ICD-10-CM | POA: Diagnosis not present

## 2018-12-09 DIAGNOSIS — I480 Paroxysmal atrial fibrillation: Secondary | ICD-10-CM | POA: Diagnosis not present

## 2018-12-09 DIAGNOSIS — L97812 Non-pressure chronic ulcer of other part of right lower leg with fat layer exposed: Secondary | ICD-10-CM | POA: Diagnosis not present

## 2018-12-09 DIAGNOSIS — M313 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: Secondary | ICD-10-CM | POA: Diagnosis not present

## 2018-12-09 DIAGNOSIS — I10 Essential (primary) hypertension: Secondary | ICD-10-CM | POA: Diagnosis not present

## 2018-12-09 DIAGNOSIS — Z86718 Personal history of other venous thrombosis and embolism: Secondary | ICD-10-CM | POA: Diagnosis not present

## 2018-12-10 ENCOUNTER — Encounter: Payer: Self-pay | Admitting: Physical Medicine & Rehabilitation

## 2018-12-10 ENCOUNTER — Ambulatory Visit (HOSPITAL_BASED_OUTPATIENT_CLINIC_OR_DEPARTMENT_OTHER): Payer: Medicare Other | Admitting: Physical Medicine & Rehabilitation

## 2018-12-10 ENCOUNTER — Other Ambulatory Visit: Payer: Self-pay

## 2018-12-10 ENCOUNTER — Ambulatory Visit: Payer: Medicare Other | Admitting: Physician Assistant

## 2018-12-10 ENCOUNTER — Encounter: Payer: Medicare Other | Attending: Physical Medicine & Rehabilitation

## 2018-12-10 DIAGNOSIS — I6381 Other cerebral infarction due to occlusion or stenosis of small artery: Secondary | ICD-10-CM | POA: Diagnosis not present

## 2018-12-10 DIAGNOSIS — R4701 Aphasia: Secondary | ICD-10-CM

## 2018-12-10 DIAGNOSIS — I69398 Other sequelae of cerebral infarction: Secondary | ICD-10-CM

## 2018-12-10 DIAGNOSIS — R269 Unspecified abnormalities of gait and mobility: Secondary | ICD-10-CM

## 2018-12-10 NOTE — Progress Notes (Signed)
Subjective:    Patient ID: [name], female    DOB: 1951-02-26, 68 y.o.   MRN: [id number] 69 year old right-handed female history of hypertension maintained on HCTZ, hyperlipidemia, GERD, chronic right lower extremity wound 8 weeks followed at [facility] wound center with weekly changes of an Haematologist, Wegener's granulomatosis maintained on Imuran as well as chronic prednisone 5 mg daily followed by rheumatology services at [facility], DVT, obstructive sleep apnea as well as history of pulmonary emboli maintained on eliquis. Received inpatient rehabilitation services 2012 debility related to Wegener's granulomatosis. Per chart review lives with spouse independent and driving prior to admission. Presented 11/03/2018 with right-sided weakness facial droop and slurred speech of acute onset. Cranial CT scan suspicious for left MCA density in the sylvian fissure. Patient did not receive TPA. 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 No evidence associated hemorrhage. MRI negative for large vessel occlusion. Echocardiogram with ejection fraction of 60% and normal systolic function. Neurology follow-up patient remained on eliquis as well as the addition of aspirin. Tolerating a regular diet. Bilateral lower extremity Dopplers negative. Carotid Dopplers negative. Therapy evaluations completed and patient was admitted for a compress of rehabilitation program.  Admit date: 11/05/2018 Discharge date: 11/08/2018  HPI  WEB EX VISIT- patient is at home patient consents to visit  Patient indicates that she is doing quite well at home.  She had home health physical therapy but decline occupational therapy because she felt like she did not need it.  She still feels like she has mild problems with word finding. 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  She has not  started driving.  We discussed that prior to her stroke she did not have any driving restrictions.  Currently her husband goes out shopping.  They do not go out much because of the COVID-19 restrictions. The patient has been seen by her primary care physician who has changed her Eliquis to 2.5 mg twice a day as well as a baby aspirin once a day Pain Inventory Average Pain 0 Pain Right Now 0 My pain is no pain  In the last 24 hours, has pain interfered with the following? General activity 0 Relation with others 0 Enjoyment of life 0 What TIME of day is your pain at its worst? only when wound being debrided Sleep (in general) Good  Pain is worse with: only when wound debridement Pain improves with: medication Relief from Meds: 9  Mobility walk without assistance use a cane ability to climb steps?  yes  Function retired I need assistance with the following:  household duties and shopping  Neuro/Psych weakness trouble walking  Prior Studies Any changes since last visit?  no  Physicians involved in your care Any changes since last visit?  no   Family History  Problem Relation Age of Onset  . Hypertension Mother   . Neuropathy Mother   . Varicose Veins Mother   . Stroke Father   . Lung cancer Father   . Arthritis Father   . Cancer Father   . Deep vein thrombosis Father   . Hypertension Father   . Varicose Veins Father   . Diabetes Maternal Grandfather   . Diabetes Maternal Uncle   . Diabetes Maternal Aunt   . Hypertension Sister   . Colon cancer Neg Hx    Social History   Socioeconomic  History  . Marital status: Married    Spouse name: Not on file  . Number of children: N  . Years of education: Not on file  . Highest education level: Not on file  Occupational History  . 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 Financial resource strain: Not on file  . Food insecurity:    Worry: Not  on file    Inability: Not on file  . Transportation needs:    Medical: Not on file    Non-medical: Not on file  Tobacco Use  . Smoking status: Never Smoker  . Smokeless tobacco: Never Used  Substance and Sexual Activity  . Alcohol use: No    Comment: rare  . Drug use: No  . Sexual activity: Yes    Partners: Male    Birth control/protection: Post-menopausal  Lifestyle  . Physical activity:    Days per week: Not on file    Minutes per session: Not on file  . Stress: Not on file  Relationships  . Social connections:    Talks on phone: Not on file    Gets together: Not on file    Attends religious service: Not on file    Active member of club or organization: Not on file    Attends meetings of clubs or organizations: Not on file    Relationship status: Not on file  Other Topics Concern  . Not on file  Social History Narrative  . Not on file   Past Surgical History:  Procedure Laterality Date  . back injection     steroid injection x2  . BRONCHOSCOPY  april 2012  . iron infusion  11/14   [facility] hematologist at [facility]  . lung biospy  may 2012  . PARTIAL HIP ARTHROPLASTY Left 03/2014   Past Medical History:  Diagnosis Date  . Allergic rhinitis   . Anemia    recurrent iron defic.  Marland Kitchen Cervical polyp 03/2003  . DDD (degenerative disc disease)   . Depression    Dr. Toy Care (situational, divorce, loss of parents)  . Diverticulosis   . DVT (deep venous thrombosis) (HCC)    both legs  . Esophageal stricture   . GERD (gastroesophageal reflux disease)   . Hiatal hernia   . Hyperlipidemia   . Hypertension   . Hypertension   . Neuropathy, peripheral   . OSA (obstructive sleep apnea)   . Osteoarthritis   . Pulmonary embolism ([place])    left lung  . Wegner's disease (congenital syphilitic osteochondritis) 2012   [facility] Ginger [facility]   LMP 12/20/2002   Opioid Risk Score:   Fall Risk Score:  `1  Depression screen PHQ 2/9  Depression screen PHQ 2/9 12/10/2018  Decreased  Interest 0  Down, Depressed, Hopeless 0  PHQ - 2 Score 0    Review of Systems  Constitutional: Negative.   HENT: Negative.   Eyes: Negative.   Respiratory: Negative.   Cardiovascular: Negative.   Gastrointestinal: Negative.   Endocrine: Negative.   Genitourinary: Negative.   Musculoskeletal: Negative.   Skin: Positive for wound.  Allergic/Immunologic: Negative.   Neurological: Positive for weakness.  Hematological: Bruises/bleeds easily.       Eliquis  Psychiatric/Behavioral: Negative.   All other systems reviewed and are negative.      Objective:   Physical Exam Nursing note reviewed.  Constitutional:      Appearance: Normal appearance. She is  obese.  HENT:     Nose: Nose normal.     Mouth/Throat:     Mouth: Mucous membranes are moist.  Skin:    General: Skin is warm and dry.  Neurological:     Mental Status: She is alert and oriented to person, place, and time.     Cranial Nerves: No cranial nerve deficit, dysarthria or facial asymmetry.     Motor: No tremor or seizure activity.     Comments: Raises both arms overhead equally. She is able to perform finger to thumb opposition bilaterally and equally side to side.  The patient is able to ambulate she does not require an assistive device.  She exhibits no loss of balance on turns.  Psychiatric:        Mood and Affect: Mood normal.        Behavior: Behavior normal.        Thought Content: Thought content normal.        Judgment: Judgment normal.     Remainder of examination is deferred secondary to WebEx visit      Assessment & Plan:  #1.  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  We discussed that it will likely take several months for her word finding deficits to clear.  We also discussed that reading out loud may help with her word finding issues.  At this point she does not feel distressed enough to want some speech therapy. We discussed that  she should hold off on driving until examined by either neurology or physical medicine and rehabilitation.  I will schedule appointment with her in 4 to 6 weeks She will follow-up medically with Dr. [name] Follow-up with [facility] neurology in June Duration of visit was 25 minutes

## 2018-12-16 ENCOUNTER — Encounter: Payer: Medicare Other | Admitting: Physician Assistant

## 2018-12-16 ENCOUNTER — Other Ambulatory Visit: Payer: Self-pay

## 2018-12-16 DIAGNOSIS — I10 Essential (primary) hypertension: Secondary | ICD-10-CM | POA: Diagnosis not present

## 2018-12-16 DIAGNOSIS — M319 Necrotizing vasculopathy, unspecified: Secondary | ICD-10-CM | POA: Diagnosis not present

## 2018-12-16 DIAGNOSIS — I480 Paroxysmal atrial fibrillation: Secondary | ICD-10-CM | POA: Diagnosis not present

## 2018-12-16 DIAGNOSIS — M313 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: Secondary | ICD-10-CM | POA: Diagnosis not present

## 2018-12-16 DIAGNOSIS — L97812 Non-pressure chronic ulcer of other part of right lower leg with fat layer exposed: Secondary | ICD-10-CM | POA: Diagnosis not present

## 2018-12-16 DIAGNOSIS — Z7901 Long term (current) use of anticoagulants: Secondary | ICD-10-CM | POA: Diagnosis not present

## 2018-12-16 DIAGNOSIS — Z86718 Personal history of other venous thrombosis and embolism: Secondary | ICD-10-CM | POA: Diagnosis not present

## 2018-12-16 NOTE — Progress Notes (Signed)
[name], [name] ([id number]) Visit Report for 12/16/2018 Arrival Information Details Patient Name: [name], [name]. Date of Service: 12/16/2018 2:45 PM Medical Record Number: [id number] Patient Account Number: [id number] Date of Birth/Sex: 1950-11-08 (68 y.o. F) Treating RN: Harold Barban Primary Care Gurman [place]: Crist Infante Other Clinician: Referring Demetris Capell: Crist Infante Treating Jairon Ripberger/Extender: Melburn Hake, HOYT Weeks in Treatment: 13 Visit Information History Since Last Visit Added or deleted any medications: No Patient Arrived: Ambulatory Any new allergies or adverse reactions: No Arrival Time: 14:49 Had a fall or experienced change in No Accompanied By: self activities of daily living that may affect Transfer Assistance: None risk of falls: Patient Identification Verified: Yes Signs or symptoms of abuse/neglect since last visito No Secondary Verification Process Yes Hospitalized since last visit: No Completed: Implantable device outside of the clinic excluding No Patient Has Alerts: Yes cellular tissue based products placed in the center Patient Alerts: Patient on Blood since last visit: Thinner Has Dressing in Place as Prescribed: Yes Eliquis Pain Present Now: No Electronic Signature(s) Signed: 12/16/2018 3:31:41 PM By: Lorine Bears RCP, RRT, CHT Entered By: Lorine Bears on 12/16/2018 14:50:27 [name] ([id number]) -------------------------------------------------------------------------------- Encounter Discharge Information Details Patient Name: [name], [name]. Date of Service: 12/16/2018 2:45 PM Medical Record Number: [id number] Patient Account Number: [id number] Date of Birth/Sex: [birth year] (68 y.o. F) Treating RN: Harold Barban Primary Care Chanceler Pullin: Crist Infante Other Clinician: Referring Jamarri Vuncannon: Crist Infante Treating Elizjah Noblet/Extender: Melburn Hake, HOYT Weeks in Treatment: 13 Encounter Discharge Information Items Post  Procedure Vitals Discharge Condition: Stable Temperature (F): 98.5 Ambulatory Status: Ambulatory Pulse (bpm): 76 Discharge Destination: Home Respiratory Rate (breaths/min): 16 Transportation: Private Auto Blood Pressure (mmHg): 137/66 Accompanied By: self Schedule Follow-up Appointment: Yes Clinical Summary of Care: Electronic Signature(s) Signed: 12/16/2018 4:10:42 PM By: Harold Barban Entered By: Harold Barban on 12/16/2018 15:29:47 [name] ([id number]) -------------------------------------------------------------------------------- Lower Extremity Assessment Details Patient Name: [name], [name]. Date of Service: 12/16/2018 2:45 PM Medical Record Number: [id number] Patient Account Number: [id number] Date of Birth/Sex: [birth year] (68 y.o. F) Treating RN: Montey Hora Primary Care Ariel Dimitri: Crist Infante Other Clinician: Referring Sincerity Cedar: Crist Infante Treating Jia Mohamed/Extender: Melburn Hake, HOYT Weeks in Treatment: 13 Edema Assessment Assessed: [Left: No] [Right: No] Edema: [Left: N] [Right: o] Calf Left: Right: Point of Measurement: 32 cm From Medial Instep cm cm Ankle Left: Right: Point of Measurement: 12 cm From Medial Instep cm cm Vascular Assessment Pulses: Dorsalis Pedis Palpable: [Right:Yes] Electronic Signature(s) Signed: 12/16/2018 4:13:42 PM By: Montey Hora Entered By: Montey Hora on 12/16/2018 15:01:27 [name] ([id number]) -------------------------------------------------------------------------------- Multi Wound Chart Details Patient Name: [name]. Date of Service: 12/16/2018 2:45 PM Medical Record Number: [id number] Patient Account Number: [id number] Date of Birth/Sex: [birth year] (68 y.o. 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] [N/A:N/A] Comorbid History: [1:Arrhythmia, Deep Vein Thrombosis, Hypertension, Osteoarthritis, Received Chemotherapy] [N/A:N/A] Date Acquired: [1:08/01/2018] [N/A:N/A] Weeks of Treatment: [1:13] [N/A:N/A] Wound Status: [1:Open] [N/A:N/A] Measurements L x W x D [1:1.2x0.5x0.1] [N/A:N/A] (cm) Area (cm) : [1:0.471] [N/A:N/A] Volume (cm) : [1:0.047] [N/A:N/A] % Reduction in Area: [1:89.10%] [N/A:N/A] % Reduction in Volume: [1:97.80%] [N/A:N/A] Classification: [1:Full Thickness Without Exposed Support Structures] [N/A:N/A] Exudate  Amount: [1:Small] [N/A:N/A] Exudate Type: [1:Serous] [N/A:N/A] Exudate Color: [1:amber] [N/A:N/A] Wound Margin: [1:Flat and Intact] [N/A:N/A] Granulation Amount: [1:Small (1-33%)] [N/A:N/A] Granulation Quality: [1:Pink] [N/A:N/A] Necrotic Amount: [1:Large (67-100%)] [N/A:N/A] Exposed Structures: [1:Fat Layer (Subcutaneous Tissue) Exposed: Yes Fascia: No Tendon: No Muscle: No Joint: No Bone: No] [N/A:N/A] Epithelialization: [1:Small (1-33%)] [N/A:N/A] Periwound Skin Texture: [1:Excoriation: Yes Induration: Yes Callus: No] [N/A:N/A] Crepitus: No Rash: No Scarring: No Periwound Skin Moisture: Dry/Scaly: Yes N/A N/A Maceration: No Periwound Skin Color: Ecchymosis: Yes N/A N/A Erythema: Yes Atrophie Blanche: No Cyanosis: No Hemosiderin Staining: No Mottled: No Pallor: No Rubor: No Erythema Location: Circumferential N/A N/A Temperature: No Abnormality N/A N/A Tenderness on Palpation: Yes N/A N/A Treatment Notes Electronic Signature(s) Signed: 12/16/2018 4:10:42 PM By: Harold Barban Entered By: Harold Barban on 12/16/2018 15:09:25 [name]  ([id number]) -------------------------------------------------------------------------------- Multi-Disciplinary Care Plan Details Patient Name: [name], [name]. Date of Service: 12/16/2018 2:45 PM Medical Record Number: [id number] Patient Account Number: [id number] Date of Birth/Sex: [birth year] (68 y.o. F) Treating RN: Harold Barban Primary Care Rihaan Barrack: Crist Infante Other Clinician: Referring Jaecion Dempster: Crist Infante Treating Madesyn Ast/Extender: Melburn Hake, HOYT Weeks in Treatment: 13 Active Inactive Necrotic Tissue Nursing Diagnoses: Impaired tissue integrity related to necrotic/devitalized tissue Knowledge deficit related to management of necrotic/devitalized tissue Goals: Necrotic/devitalized tissue will be minimized in the wound bed Date Initiated: 09/12/2018 Target Resolution Date: 12/21/2018 Goal Status: Active Interventions: Assess patient pain level pre-, during and post procedure and prior to discharge Provide education on necrotic tissue and debridement process Treatment Activities: Apply topical anesthetic as ordered : 09/12/2018 Notes: Orientation to the Wound Care Program Nursing Diagnoses: Knowledge deficit related to the wound healing center program Goals: Patient/caregiver will verbalize understanding of the [facility] Date Initiated: 09/12/2018 Target Resolution Date: 12/21/2018 Goal Status: Active Interventions: Provide education on orientation to the wound center Notes: Pain, Acute or Chronic Nursing Diagnoses: Pain Management - Non-cyclic Acute (Procedural) Goals: Patient will verbalize adequate pain control and receive pain control interventions during procedures as needed MADLYNN, LUNDEEN ([id number]) Date Initiated: 09/12/2018 Target Resolution Date: 12/21/2018 Goal Status: Active Interventions: Assess comfort goal upon admission Encourage patient to take pain medications as prescribed Treatment Activities: Administer pain control measures  as ordered : 09/12/2018 Notes: Wound/Skin Impairment Nursing Diagnoses: Impaired tissue integrity Goals: Ulcer/skin breakdown will have a volume reduction of 30% by week 4 Date Initiated: 09/12/2018 Target Resolution Date: 12/21/2018 Goal Status: Active Interventions: Assess patient/caregiver ability to obtain necessary supplies Assess patient/caregiver ability to perform ulcer/skin care regimen upon admission and as needed Assess ulceration(s) every visit Treatment Activities: Referred to DME Parthenia Tellefsen for dressing supplies : 09/12/2018 Skin care regimen initiated : 09/12/2018 Notes: Electronic Signature(s) Signed: 12/16/2018 4:10:42 PM By: Harold Barban Entered By: Harold Barban on 12/16/2018 15:09:05 [name] ([id number]) -------------------------------------------------------------------------------- Pain Assessment Details Patient Name: [name], [name]. Date of Service: 12/16/2018 2:45 PM Medical Record Number: [id number] Patient Account Number: [id number] Date of Birth/Sex: [birth year] (68 y.o. F) Treating RN: Harold Barban Primary Care Dinh Ayotte: Crist Infante Other Clinician: Referring Tanishka Drolet: Crist Infante Treating Amore Ackman/Extender: Melburn Hake, HOYT Weeks in Treatment: 13 Active Problems Location of Pain Severity and Description of Pain Patient Has Paino No Site Locations Pain Management and Medication Current Pain Management: Electronic Signature(s) Signed: 12/16/2018 3:31:41 PM By: Lorine Bears RCP, RRT, CHT Signed: 12/16/2018 4:10:42 PM By: Harold Barban Entered By: Lorine Bears on 12/16/2018 14:50:34 [name] ([id number]) -------------------------------------------------------------------------------- Patient/Caregiver Education Details Patient Name: [name], [name]. Date of Service: 12/16/2018  2:45 PM Medical Record Number: [id number] Patient Account Number: [id number] Date of Birth/Gender: 1950-10-24 (68 y.o. F) Treating  RN: Harold Barban Primary Care Physician: Crist Infante Other Clinician: Referring Physician: Crist Infante Treating Physician/Extender: Sharalyn Ink in Treatment: 13 Education Assessment Education Provided To: Patient Education Topics Provided Wound/Skin Impairment: Handouts: Caring for Your Ulcer Methods: Demonstration, Explain/Verbal Responses: State content correctly Electronic Signature(s) Signed: 12/16/2018 4:10:42 PM By: Harold Barban Entered By: Harold Barban on 12/16/2018 15:11:40 [name] ([id number]) -------------------------------------------------------------------------------- Wound Assessment Details Patient Name: [name], [name]. Date of Service: 12/16/2018 2:45 PM Medical Record Number: [id number] Patient Account Number: [id number] Date of Birth/Sex: 01/15/1951 (68 y.o. F) Treating RN: Montey Hora Primary Care Brenlynn Fake: Crist Infante Other Clinician: Referring Freddrick Gladson: Crist Infante Treating Iann Rodier/Extender: Melburn Hake, HOYT Weeks in Treatment: 13 Wound Status Wound Number: 1 Primary Auto-immune Etiology: Wound Location: Right Lower Leg - Lateral Wound Open Wounding Event: Bump Status: Date Acquired: 08/01/2018 Comorbid Arrhythmia, Deep Vein Thrombosis, Weeks Of Treatment: 13 History: Hypertension, Osteoarthritis, Received Clustered Wound: No Chemotherapy Wound Measurements Length: (cm) 1.2 Width: (cm) 0.5 Depth: (cm) 0.1 Area: (cm) 0.471 Volume: (cm) 0.047 % Reduction in Area: 89.1% % Reduction in Volume: 97.8% Epithelialization: Small (1-33%) Tunneling: No Undermining: No Wound Description Full Thickness Without Exposed Support Foul Od Classification: Structures Slough/ Wound Margin: Flat and Intact Exudate Small Amount: Exudate Type: Serous Exudate Color: amber or After Cleansing: No Fibrino Yes Wound Bed Granulation Amount: Small (1-33%) Exposed Structure Granulation Quality: Pink Fascia Exposed: No Necrotic  Amount: Large (67-100%) Fat Layer (Subcutaneous Tissue) Exposed: Yes Necrotic Quality: Adherent Slough Tendon Exposed: No Muscle Exposed: No Joint Exposed: No Bone Exposed: No Periwound Skin Texture Texture Color No Abnormalities Noted: No No Abnormalities Noted: No Callus: No Atrophie Blanche: No Crepitus: No Cyanosis: No Excoriation: Yes Ecchymosis: Yes Induration: Yes Erythema: Yes Rash: No Erythema Location: Circumferential Scarring: No Hemosiderin Staining: No Mottled: No Moisture Pallor: No No Abnormalities Noted: No Rubor: No [name], [name] ([id number]) Dry / Scaly: Yes Temperature / Pain Maceration: No Temperature: No Abnormality Tenderness on Palpation: Yes Treatment Notes Wound #1 (Right, Lateral Lower Leg) Notes SIlver collegan moistened with [place] Jelly, adaptic, non-adherent pad, conform, tubigrip E Electronic Signature(s) Signed: 12/16/2018 4:13:42 PM By: Montey Hora Entered By: Montey Hora on 12/16/2018 15:01:09 [name] ([id number]) -------------------------------------------------------------------------------- Vitals Details Patient Name: [name]. Date of Service: 12/16/2018 2:45 PM Medical Record Number: [id number] Patient Account Number: [id number] Date of Birth/Sex: 12/19/1950 (68 y.o. F) Treating RN: Harold Barban Primary Care Mickelle Goupil: Crist Infante Other Clinician: Referring Ileta Ofarrell: Crist Infante Treating Giani Betzold/Extender: Melburn Hake, HOYT Weeks in Treatment: 13 Vital Signs Time Taken: 14:50 Temperature (F): 98.5 Height (in): 63 Pulse (bpm): 76 Weight (lbs): 174 Respiratory Rate (breaths/min): 16 Body Mass Index (BMI): 30.8 Blood Pressure (mmHg): 137/66 Reference Range: 80 - 120 mg / dl Electronic Signature(s) Signed: 12/16/2018 3:31:41 PM By: Lorine Bears RCP, RRT, CHT Entered By: Lorine Bears on 12/16/2018 14:53:01

## 2018-12-16 NOTE — Progress Notes (Signed)
VASILIKI, SMALDONE ([id number]) Visit Report for 12/16/2018 Chief Complaint Document Details Patient Name: [name], WYNN. Date of Service: 12/16/2018 2:45 PM Medical Record Number: [id number] Patient Account Number: [id number] Date of Birth/Sex: 11-Nov-1950 (68 y.o. F) Treating RN: Harold Barban Primary Care Provider: Crist Infante Other Clinician: Referring Provider: Crist Infante Treating Provider/Extender: Melburn Hake, Sayer Masini Weeks in Treatment: 13 Information Obtained from: Patient Chief Complaint Right LE Ulcer Electronic Signature(s) Signed: 12/16/2018 5:07:11 PM By: Worthy Keeler PA-C Entered By: Worthy Keeler on 12/16/2018 14:47:22 Arment, [name] ([id number]) -------------------------------------------------------------------------------- Debridement Details Patient Name: [name] Date of Service: 12/16/2018 2:45 PM Medical Record Number: [id number] Patient Account Number: [id number] Date of Birth/Sex: 06-03-1951 (68 y.o. F) Treating RN: Harold Barban Primary Care Provider: Crist Infante Other Clinician: Referring Provider: Crist Infante Treating Provider/Extender: Melburn Hake, Chipper Koudelka Weeks in Treatment: 13 Debridement Performed for Wound #1 Right,Lateral Lower Leg Assessment: Performed By: Physician STONE III, Kawhi Diebold E., PA-C Debridement Type: Debridement Level of Consciousness (Pre- Awake and Alert procedure): Pre-procedure Verification/Time Yes - 15:09 Out Taken: Start Time: 15:09 Pain Control: Lidocaine Total Area Debrided (L x W): 1.2 (cm) x 0.5 (cm) = 0.6 (cm) Tissue and other material Non-Viable, Slough, Subcutaneous, Slough debrided: Level: Skin/Subcutaneous Tissue Debridement Description: Excisional Instrument: Curette Bleeding: Minimum Hemostasis Achieved: Pressure End Time: 15:13 Procedural Pain: 0 Post Procedural Pain: 0 Response to Treatment: Procedure was tolerated well Level of Consciousness Awake and Alert (Post-procedure): Post Debridement  Measurements of Total Wound Length: (cm) 1.2 Width: (cm) 0.5 Depth: (cm) 0.1 Volume: (cm) 0.047 Character of Wound/Ulcer Post Debridement: Improved Post Procedure Diagnosis Same as Pre-procedure Electronic Signature(s) Signed: 12/16/2018 4:10:42 PM By: Harold Barban Signed: 12/16/2018 5:07:11 PM By: Worthy Keeler PA-C Entered By: Harold Barban on 12/16/2018 15:11:17 [name] ([id number]) -------------------------------------------------------------------------------- HPI Details Patient Name: [name] Date of Service: 12/16/2018 2:45 PM Medical Record Number: [id number] Patient Account Number: [id number] Date of Birth/Sex: [birth year] (68 y.o. 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 She states that she often has areas like this that will come up but normally not to the severity. She's typically able to take care of these herself. However she's been having a lot of trouble since this past summer in particular. 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 She was most recently treated with this by her primary care provider. Fortunately that seems to have cleared up any infection that she may have had. Nonetheless other pertinent medical history includes the chronic long-term use of Eliquis secondary to atrial fibrillation. Her ABI appear to be okay today. She did have a DVT 13 years ago and this lag but has not had anything recently. The Eliquis seems to be beneficial in this regard. Otherwise there's no evidence of active infection at this time. No fevers, chills,  nausea, or vomiting noted at this time. 09/19/18 on evaluation today patient presents for follow-up concerning her ongoing lower extremity wound. This is her second visit here in our clinic. She does have slough covering the surface of the wound today. Fortunately there is no signs of infection at this time. Overall I have been pleased with the interval  change although she still has a lot of Slough I do feel like the Iodoflex was of benefit for her. We did have to change her wrap once in the weeks since I last saw her fortunately after that changed nothing seem to smell or drain through. 09/27/18 on evaluation today patient appears to be doing better in regard to her right lateral lower extremity ulcer. She's been tolerating the dressing changes without complication. Fortunately there is no sign of infection at this time. With that being said we have had to change the wrap at least once in the interim between when we see her and when she comes back in for evaluation due to the amount of drainage an odor. With that being said she is getting very frustrated with the length of time is taken get this to heal. Fortunately there is no evidence of anything worsening and a lot of the slough is improving she has good granulation seems to be peeking out from underneath. She is still having discomfort unfortunately. 10/03/18 on evaluation today patient's wound actually appears to be doing better from the standpoint of feeling in. With that being said she actually does have some slough covering the surface of the wound unfortunately. This is going to require sharp debridement prior to application of EpiFix. 10/15/18 on evaluation today patient actually appears to be doing much better in regard to her wound. I do believe that the EpiFix has been of benefit for her which is excellent news. Overall I think that this is something I would definitely recommend continuing I've seen definite progress. 10/22/18 on evaluation  today patient actually appears to be doing very well in regard to her leg ulcer. This is shown signs of excellent improvement measurement wise she is continuing to week by week make great progress in my pinion. Overall very pleased in this regard. Fortunately there's no signs of infection at this time. I do believe that the EpiFix has been beneficial for her. 10/29/18 on evaluation today patient actually appears to be doing very well today. The EpiFix does seem to be benefiting her as far as the overall improvement we have seen is concerned. Fortunately there's no signs of infection at this time and again her pain is much less than what it used to be. Overall I feel like she's making excellent progress. 11/12/18 on evaluation today patient presents after unfortunately having had a stroke since the last time I seen her until today. She tells me that she's actually doing very well in this regard she did have some rehab in the hospital and then subsequently was discharged to home. She is home at this point. Nonetheless she has physical therapy a potential occupational therapy is gonna be coming out to see her. Nonetheless she tells me that she is not having any evidence at this time of active infection which is good news she's not having any evidence of pain either in regard to the right lateral lower extremity. Overall I feel like she has done well as far as I'm concerned in the EpiFix which did stay in place for longer than the week obviously I think still have good effect based on what I'm seeing at this point the wound is significantly smaller. ICYSS, SKOG ([id number]) 11/18/18 on evaluation today patient comes in one day early for evaluation here in our clinic due to issues that she's having with her right lower extremity she states she was having a lot of discomfort and could not wait any longer  to have this checked. Fortunately she did come in because she's actually having what appears to be  significant infection which needs to be addressed I did not want this to continue without being addressed. No fevers, chills, nausea, or vomiting noted at this time. 11/26/18 on evaluation today patient will significantly better compared to last week regarding her lower extremity ulcer. Last week she had an infection we finally did get the results of the culture back which did show methicillin-resistant Staphylococcus aureus as the positive organism. Subsequently the antibiotic that she's been on has completely resolved the infection as best I can tell she seems to be doing absolutely excellent today. I'm very pleased in this regard. She's having little to no drainage and no pain. Last week and this was very painful. 12/03/18 on evaluation today patient appears to be doing rather well in regard to her lateral lower extremity ulcer on the right. She's been tolerating the dressing changes without complication. Fortunately the wound is shown signs of good improvement this week. The infection is in your much better control and I think were making excellent progress. 12/09/18 on evaluation today patient appears to be doing decently well in regard to her lower Trinity ulcer this point. Fortunately there's no signs of active infection at this time. Overall I'm very pleased with how things seem to be progressing. With that being said although I did want to see her today to ensure there's no signs of active infection I did want to also see about leaving the EpiFix in place for one additional week before complete removal. 12/16/18 on evaluation today patient actually appears to be doing very well in regard to her wound. She has some dressing material stuck to the surface the wound but other than this seems to be making good progress. There's no signs of active infection at this time which is good news. Electronic Signature(s) Signed: 12/16/2018 5:07:11 PM By: Worthy Keeler PA-C Entered By: Worthy Keeler on  12/16/2018 15:31:40 [name] ([id number]) -------------------------------------------------------------------------------- Physical Exam Details Patient Name: CASSEY, BACIGALUPO. Date of Service: 12/16/2018 2:45 PM Medical Record Number: [id number] Patient Account Number: [id number] Date of Birth/Sex: [birth year] (68 y.o. F) Treating RN: Harold Barban Primary Care Provider: Crist Infante Other Clinician: Referring Provider: Crist Infante Treating Provider/Extender: STONE III, Myleen Brailsford Weeks in Treatment: 26 Constitutional Well-nourished and well-hydrated in no acute distress. Respiratory normal breathing without difficulty. Psychiatric this patient is able to make decisions and demonstrates good insight into disease process. Alert and Oriented x 3. pleasant and cooperative. Notes Patient's wound bed did require some sharp agreement to remove some of the necrotic tissue from the surface of the wound which he tolerated today without complication post debridement wound bed appears to be doing much better which is excellent news. Electronic Signature(s) Signed: 12/16/2018 5:07:11 PM By: Worthy Keeler PA-C Entered By: Worthy Keeler on 12/16/2018 15:32:13 [name] ([id number]) -------------------------------------------------------------------------------- Physician Orders Details Patient Name: ANNETE, AYUSO. Date of Service: 12/16/2018 2:45 PM Medical Record Number: [id number] Patient Account Number: [id number] Date of Birth/Sex: 04/01/1951 (68 y.o. 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 o May shower with protection. - Please do not get your wrap wet Anesthetic (add to Medication List) Wound #1 Right,Lateral Lower Leg o Topical Lidocaine 4% cream applied to wound bed prior to debridement (In Clinic Only). o Benzocaine Topical Anesthetic Spray applied to wound bed prior to debridement (In Clinic Only). Primary Wound Dressing Wound #1 Right,Lateral Lower Leg o Silver Collagen - Use [place] Jelly to moisten, apply adaptic on top Secondary Dressing Wound #1 Right,Lateral Lower Leg o Conform/Kerlix - Rolled conform to secure Dressing Change Frequency Wound #1 Right,Lateral Lower Leg o Other: - Change every three days Follow-up Appointments Wound #1 Right,Lateral Lower Leg o Return Appointment in 1 week. Edema Control Wound #1 Right,Lateral Lower Leg o Elevate legs to the level of the heart and pump ankles as often as possible o Other: [address] SHERROL, VICARS ([id number]) Electronic Signature(s) Signed: 12/16/2018 4:10:42 PM By: Harold Barban Signed: 12/16/2018 5:07:11 PM By: Worthy Keeler PA-C Entered By: Harold Barban on 12/16/2018 15:27:41 [name] ([id number]) -------------------------------------------------------------------------------- Problem List Details Patient Name: AMADA, HALLISEY. Date of Service: 12/16/2018 2:45 PM Medical Record Number: [id number] Patient Account Number: [id number] Date of Birth/Sex: 1951/06/18 (68 y.o. F) Treating RN: Harold Barban Primary Care Provider: Crist Infante Other Clinician: Referring Provider: Crist Infante Treating Provider/Extender: Melburn Hake, Nairi Oswald Weeks in Treatment: 13 Active Problems ICD-10 Evaluated Encounter Code Description Active Date Today Diagnosis M31.30 Wegener's granulomatosis without renal  involvement 09/12/2018 No Yes L97.812 Non-pressure chronic ulcer of other part of right lower leg 09/12/2018 No Yes with fat layer exposed I48.0 Paroxysmal atrial fibrillation 09/12/2018 No Yes I10 Essential (primary) hypertension 09/12/2018 No Yes Z79.01 Long term (current) use of anticoagulants 09/12/2018 No Yes Inactive Problems Resolved Problems Electronic Signature(s) Signed: 12/16/2018 5:07:11 PM By: Worthy Keeler PA-C Entered By: Worthy Keeler on 12/16/2018 14:47:17 Greenhaw, [name] ([id number]) -------------------------------------------------------------------------------- Progress Note Details Patient Name: [name] Date of Service: 12/16/2018 2:45 PM Medical Record Number: [id number] Patient Account Number: [id number] Date of Birth/Sex: [birth year] (68 y.o. 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 She states that she often has areas like this that will come up but normally not to the severity. She's typically able to take care of these herself. However she's been having a lot of trouble since this past summer in particular. 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 She was most recently treated with this by her primary care provider. Fortunately that seems to have cleared up any infection that she may have had. Nonetheless other pertinent medical history includes the chronic long-term use of Eliquis secondary to atrial fibrillation. Her ABI appear to be okay today. She did have a DVT 13 years ago and  this lag but has not had anything recently. The Eliquis seems to be beneficial in this regard. Otherwise there's no evidence of active infection at this time. No fevers, chills, nausea, or vomiting noted at this time. 09/19/18 on evaluation today patient presents for follow-up concerning her ongoing lower  extremity wound. This is her second visit here in our clinic. She does have slough covering the surface of the wound today. Fortunately there is no signs of infection at this time. Overall I have been pleased with the interval change although she still has a lot of Slough I do feel like the Iodoflex was of benefit for her. We did have to change her wrap once in the weeks since I last saw her fortunately after that changed nothing seem to smell or drain through. 09/27/18 on evaluation today patient appears to be doing better in regard to her right lateral lower extremity ulcer. She's been tolerating the dressing changes without complication. Fortunately there is no sign of infection at this time. With that being said we have had to change the wrap at least once in the interim between when we see her and when she comes back in for evaluation due to the amount of drainage an odor. With that being said she is getting very frustrated with the length of time is taken get this to heal. Fortunately there is no evidence of anything worsening and a lot of the slough is improving she has good granulation seems to be peeking out from underneath. She is still having discomfort unfortunately. 10/03/18 on evaluation today patient's wound actually appears to be doing better from the standpoint of feeling in. With that being said she actually does have some slough covering the surface of the wound unfortunately. This is going to require sharp debridement prior to application of EpiFix. 10/15/18 on evaluation today patient actually appears to be doing much better in regard to her wound. I do believe that the EpiFix has  been of benefit for her which is excellent news. Overall I think that this is something I would definitely recommend continuing I've seen definite progress. 10/22/18 on evaluation today patient actually appears to be doing very well in regard to her leg ulcer. This is shown signs of excellent improvement measurement wise she is continuing to week by week make great progress in my pinion. Overall very pleased in this regard. Fortunately there's no signs of infection at this time. I do believe that the EpiFix has been beneficial for her. 10/29/18 on evaluation today patient actually appears to be doing very well today. The EpiFix does seem to be benefiting her as far as the overall improvement we have seen is concerned. Fortunately there's no signs of infection at this time and again her pain is much less than what it used to be. Overall I feel like she's making excellent progress. 11/12/18 on evaluation today patient presents after unfortunately having had a stroke since the last time I seen her until today. TORIN, WHISNER ([id number]) She tells me that she's actually doing very well in this regard she did have some rehab in the hospital and then subsequently was discharged to home. She is home at this point. Nonetheless she has physical therapy a potential occupational therapy is gonna be coming out to see her. Nonetheless she tells me that she is not having any evidence at this time of active infection which is good news she's not having any evidence of pain either in regard to the right lateral lower extremity. Overall I feel like she has done well as far as I'm concerned in the EpiFix which did stay in place for longer than the week obviously I think still have good effect based on what I'm seeing at this point the wound is significantly smaller. 11/18/18 on  evaluation today patient comes in one day early for evaluation here in our clinic due to issues that she's having with her right lower extremity  she states she was having a lot of discomfort and could not wait any longer to have this checked. Fortunately she did come in because she's actually having what appears to be significant infection which needs to be addressed I did not want this to continue without being addressed. No fevers, chills, nausea, or vomiting noted at this time. 11/26/18 on evaluation today patient will significantly better compared to last week regarding her lower extremity ulcer. Last week she had an infection we finally did get the results of the culture back which did show methicillin-resistant Staphylococcus aureus as the positive organism. Subsequently the antibiotic that she's been on has completely resolved the infection as best I can tell she seems to be doing absolutely excellent today. I'm very pleased in this regard. She's having little to no drainage and no pain. Last week and this was very painful. 12/03/18 on evaluation today patient appears to be doing rather well in regard to her lateral lower extremity ulcer on the right. She's been tolerating the dressing changes without complication. Fortunately the wound is shown signs of good improvement this week. The infection is in your much better control and I think were making excellent progress. 12/09/18 on evaluation today patient appears to be doing decently well in regard to her lower Trinity ulcer this point. Fortunately there's no signs of active infection at this time. Overall I'm very pleased with how things seem to be progressing. With that being said although I did want to see her today to ensure there's no signs of active infection I did want to also see about leaving the EpiFix in place for one additional week before complete removal. 12/16/18 on evaluation today patient actually appears to be doing very well in regard to her wound. She has some dressing material stuck to the surface the wound but other than this seems to be making good progress. There's  no signs of active infection at this time which is good news. Patient History Information obtained from Patient. Family History Cancer - Father, Diabetes - Maternal Grandparents, Lung Disease - Father, Stroke - Father, No family history of Heart Disease, Hereditary Spherocytosis, Hypertension, Kidney Disease, Seizures, Thyroid Problems, Tuberculosis. Social History Never smoker, Marital Status - Married, Alcohol Use - Never, Drug Use - No History, Caffeine Use - Daily. Medical History Eyes Denies history of Cataracts, Glaucoma, Optic Neuritis Ear/Nose/Mouth/Throat Denies history of Chronic sinus problems/congestion, Middle ear problems Hematologic/Lymphatic Denies history of Anemia, Hemophilia, Human Immunodeficiency Virus, Sickle Cell Disease Respiratory Denies history of Aspiration, Asthma, Chronic Obstructive Pulmonary Disease (COPD), Pneumothorax, Sleep Apnea, Tuberculosis Cardiovascular Patient has history of Arrhythmia - a fib, Deep Vein Thrombosis - 13 years ago, Hypertension Denies history of Angina, Congestive Heart Failure, Coronary Artery Disease, Hypotension, Myocardial Infarction, Peripheral Arterial Disease, Peripheral Venous Disease, Phlebitis, Vasculitis Gastrointestinal Denies history of Cirrhosis , Colitis, Crohn s, Hepatitis A, Hepatitis B, Hepatitis C Endocrine MARGARINE, GROSSHANS ([id number]) Denies history of Type I Diabetes, Type II Diabetes Genitourinary Denies history of End Stage Renal Disease Immunological Denies history of Lupus Erythematosus, Raynaud s, Scleroderma Integumentary (Skin) Denies history of History of Burn, History of pressure wounds Musculoskeletal Patient has history of Osteoarthritis Denies history of Gout, Rheumatoid Arthritis, Osteomyelitis Neurologic Denies history of Dementia, Neuropathy, Quadriplegia, Paraplegia, Seizure Disorder Oncologic Patient has history of Received Chemotherapy - past for Wegeners Denies history  of Received  Radiation Psychiatric Denies history of Anorexia/bulimia, Confinement Anxiety Medical And Surgical History Notes Immunological Wegeners Musculoskeletal OP Review of Systems (ROS) Constitutional Symptoms (General Health) Denies complaints or symptoms of Fatigue, Fever, Chills, Marked Weight Change. Respiratory Denies complaints or symptoms of Chronic or frequent coughs, Shortness of Breath. Cardiovascular Denies complaints or symptoms of Chest pain, LE edema. Psychiatric Denies complaints or symptoms of Anxiety, Claustrophobia. Objective Constitutional Well-nourished and well-hydrated in no acute distress. Vitals Time Taken: 2:50 PM, Height: 63 in, Weight: 174 lbs, BMI: 30.8, Temperature: 98.5 F, Pulse: 76 bpm, Respiratory Rate: 16 breaths/min, Blood Pressure: 137/66 mmHg. Respiratory normal breathing without difficulty. Psychiatric this patient is able to make decisions and demonstrates good insight into disease process. Alert and Oriented x 3. pleasant and cooperative. JAYME, MEDNICK ([id number]) General Notes: Patient's wound bed did require some sharp agreement to remove some of the necrotic tissue from the surface of the wound which he tolerated today without complication post debridement wound bed appears to be doing much better which is excellent news. Integumentary (Hair, Skin) Wound #1 status is Open. Original cause of wound was Bump. The wound is located on the Right,Lateral Lower Leg. The wound measures 1.2cm length x 0.5cm width x 0.1cm depth; 0.471cm^2 area and 0.047cm^3 volume. There is Fat Layer (Subcutaneous Tissue) Exposed exposed. There is no tunneling or undermining noted. There is a small amount of serous drainage noted. The wound margin is flat and intact. There is small (1-33%) pink granulation within the wound bed. There is a large (67-100%) amount of necrotic tissue within the wound bed including Adherent Slough. The periwound skin appearance exhibited:  Excoriation, Induration, Dry/Scaly, Ecchymosis, Erythema. The periwound skin appearance did not exhibit: Callus, Crepitus, Rash, Scarring, Maceration, Atrophie Blanche, Cyanosis, Hemosiderin Staining, Mottled, Pallor, Rubor. The surrounding wound skin color is noted with erythema which is circumferential. Periwound temperature was noted as No Abnormality. The periwound has tenderness on palpation. 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 There was a Excisional Skin/Subcutaneous Tissue Debridement with a total area of 0.6 sq cm performed by STONE III, Angelena Sand E., PA-C. With the following instrument(s): Curette to remove Non-Viable tissue/material. Material removed includes Subcutaneous Tissue and Slough and after achieving pain control using Lidocaine. No specimens were taken. A time out was conducted at 15:09, prior to the start of the procedure. A Minimum amount of bleeding was controlled with Pressure. The procedure was tolerated well with a pain level of 0 throughout and a pain level of 0 following the procedure. Post Debridement Measurements: 1.2cm length x 0.5cm width x 0.1cm depth; 0.047cm^3 volume. Character of Wound/Ulcer Post Debridement is improved. Post procedure Diagnosis Wound #1: Same as Pre-Procedure Plan Wound Cleansing: Wound #1 Right,Lateral Lower Leg: Clean wound with Normal Saline. May shower with protection. - Please do not get your wrap wet ZAREA, DIESING ([id number]) Anesthetic (add to Medication List): Wound #1 Right,Lateral Lower Leg: Topical Lidocaine 4% cream applied to wound bed prior to debridement (In Clinic  Only). Benzocaine Topical Anesthetic Spray applied to wound bed prior to debridement (In Clinic Only). Primary Wound Dressing: Wound #1 Right,Lateral Lower Leg: Silver Collagen - Use [place] Jelly to moisten, apply adaptic on top Secondary Dressing: Wound #1 Right,Lateral  Lower Leg: Conform/Kerlix - Rolled conform to secure Dressing Change Frequency: Wound #1 Right,Lateral Lower Leg: Other: - Change every three days Follow-up Appointments: Wound #1 Right,Lateral Lower Leg: Return Appointment in 1 week. Edema Control: Wound #1 Right,Lateral Lower Leg: Elevate legs to the level of the heart and pump ankles as often as possible Other: - Tubi Grip E My suggestion at this point is gonna be that we go ahead and continue with the above wound care measures for the next week and the patient is in agreement with plan. We will subsequently see were things stand at follow-up. I'm hopeful that at any hydrogel as well as the adapted will help to keep the Prisma from drying out and allow this to heal even more quickly. The patient is in agreement with giving this a try. Otherwise will see were things stand at follow-up. Please see above for specific wound care orders. We will see patient for re-evaluation in 1 week(s) here in the clinic. If anything worsens or changes patient will contact our office for additional recommendations. Electronic Signature(s) Signed: 12/16/2018 5:07:11 PM By: Worthy Keeler PA-C Entered By: Worthy Keeler on 12/16/2018 15:32:36 [name] ([id number]) -------------------------------------------------------------------------------- ROS/PFSH Details Patient Name: JENNAVECIA, SCHWIER. Date of Service: 12/16/2018 2:45 PM Medical Record Number: [id number] Patient Account Number: [id number] Date of Birth/Sex: 07/11/1951 (68 y.o. F) Treating RN: Harold Barban Primary Care Provider: Crist Infante Other Clinician: Referring Provider: Crist Infante Treating Provider/Extender: Melburn Hake, Anise Harbin Weeks in Treatment: 13 Information Obtained From Patient Constitutional Symptoms (General Health) Complaints and Symptoms: Negative for: Fatigue; Fever; Chills; Marked Weight Change Respiratory Complaints and Symptoms: Negative for: Chronic or frequent coughs; Shortness of Breath Medical History: Negative for: Aspiration; Asthma; Chronic Obstructive Pulmonary Disease (COPD); Pneumothorax; Sleep Apnea; Tuberculosis Cardiovascular Complaints and Symptoms: Negative for: Chest pain; LE edema Medical History: Positive for: Arrhythmia - a fib; Deep Vein Thrombosis - 13 years ago; Hypertension Negative for: Angina; Congestive Heart Failure; Coronary Artery Disease; Hypotension; Myocardial Infarction; Peripheral Arterial Disease; Peripheral Venous Disease; Phlebitis; Vasculitis Psychiatric Complaints and Symptoms: Negative for: Anxiety; Claustrophobia Medical History: Negative for: Anorexia/bulimia; Confinement Anxiety Eyes Medical History: Negative for: Cataracts; Glaucoma; Optic Neuritis Ear/Nose/Mouth/Throat Medical History: Negative for: Chronic sinus problems/congestion; Middle ear problems Hematologic/Lymphatic Medical History: Negative for: Anemia; Hemophilia; Human Immunodeficiency Virus; Sickle Cell Disease SHANTY, GINTY ([id number]) Gastrointestinal Medical History: Negative for: Cirrhosis ; Colitis; Crohnos; Hepatitis A; Hepatitis B; Hepatitis C Endocrine Medical History: Negative for: Type I Diabetes; Type II Diabetes Genitourinary Medical History: Negative for: End Stage Renal Disease Immunological Medical History: Negative for: Lupus Erythematosus; Raynaudos; Scleroderma Past Medical History Notes: Wegeners Integumentary (Skin) Medical History: Negative for: History of Burn; History of pressure wounds Musculoskeletal Medical History: Positive for: Osteoarthritis Negative for: Gout; Rheumatoid Arthritis; Osteomyelitis Past Medical History  Notes: OP Neurologic Medical History: Negative for: Dementia; Neuropathy; Quadriplegia; Paraplegia; Seizure Disorder Oncologic Medical History: Positive for: Received Chemotherapy - past for Wegeners Negative for: Received Radiation Immunizations Pneumococcal Vaccine: Received Pneumococcal Vaccination: Yes Implantable Devices No devices added Family and Social History Cancer: Yes - Father; Diabetes: Yes - Maternal Grandparents; Heart Disease: No; Hereditary Spherocytosis: No; Hypertension: No; Kidney Disease: No; Lung Disease: Yes - Father; Seizures: No; Stroke: Yes - Father; Thyroid Problems: No; Tuberculosis: No; Never smoker; Marital Status - Married; Alcohol Use: Never; Drug Use: No History; Caffeine Use: Daily; Financial Concerns: No; Food, Clothing or Shelter Needs: No; Support System Lacking: No; Transportation Concerns: No DUNYA, MEINERS ([id number]) Physician Affirmation  I have reviewed and agree with the above information. Electronic Signature(s) Signed: 12/16/2018 4:10:42 PM By: Harold Barban Signed: 12/16/2018 5:07:11 PM By: Worthy Keeler PA-C Entered By: Worthy Keeler on 12/16/2018 15:32:00 [name] ([id number]) -------------------------------------------------------------------------------- SuperBill Details Patient Name: STORMIE, VENTOLA. Date of Service: 12/16/2018 Medical Record Number: [id number] Patient Account Number: [id number] Date of Birth/Sex: January 25, 1951 (68 y.o. 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 with fat layer exposed I48.0 Paroxysmal atrial fibrillation I10 Essential (primary) hypertension Z79.01 Long term (current) use of  anticoagulants Facility Procedures CPT4 Code Description: 58309407 11042 - DEB SUBQ TISSUE 20 SQ CM/< ICD-10 Diagnosis Description W80.881 Non-pressure chronic ulcer of other part of right lower leg wi Modifier: th fat layer expo Quantity: 1 sed Physician Procedures CPT4 Code Description: 1031594 11042 - WC PHYS SUBQ TISS 20 SQ CM ICD-10 Diagnosis Description V85.929 Non-pressure chronic ulcer of other part of right lower leg wi Modifier: th fat layer expo Quantity: 1 sed Electronic Signature(s) Signed: 12/16/2018 5:07:11 PM By: Worthy Keeler PA-C Entered By: Worthy Keeler on 12/16/2018 15:32:44

## 2018-12-23 ENCOUNTER — Encounter: Payer: Medicare Other | Attending: Physician Assistant | Admitting: Physician Assistant

## 2018-12-23 ENCOUNTER — Other Ambulatory Visit: Payer: Self-pay

## 2018-12-23 DIAGNOSIS — Z7901 Long term (current) use of anticoagulants: Secondary | ICD-10-CM | POA: Insufficient documentation

## 2018-12-23 DIAGNOSIS — M313 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: Secondary | ICD-10-CM | POA: Insufficient documentation

## 2018-12-23 DIAGNOSIS — L97812 Non-pressure chronic ulcer of other part of right lower leg with fat layer exposed: Secondary | ICD-10-CM | POA: Insufficient documentation

## 2018-12-23 DIAGNOSIS — I10 Essential (primary) hypertension: Secondary | ICD-10-CM | POA: Diagnosis not present

## 2018-12-23 DIAGNOSIS — I480 Paroxysmal atrial fibrillation: Secondary | ICD-10-CM | POA: Diagnosis not present

## 2018-12-23 DIAGNOSIS — Z86718 Personal history of other venous thrombosis and embolism: Secondary | ICD-10-CM | POA: Diagnosis not present

## 2018-12-23 DIAGNOSIS — M359 Systemic involvement of connective tissue, unspecified: Secondary | ICD-10-CM | POA: Diagnosis not present

## 2018-12-24 DIAGNOSIS — I10 Essential (primary) hypertension: Secondary | ICD-10-CM | POA: Diagnosis not present

## 2018-12-24 DIAGNOSIS — J329 Chronic sinusitis, unspecified: Secondary | ICD-10-CM | POA: Diagnosis not present

## 2018-12-26 NOTE — Progress Notes (Signed)
KASHANA, BREACH ([id number]) Visit Report for 12/23/2018 Arrival Information Details Patient Name: MONA, AYARS. Date of Service: 12/23/2018 10:45 AM Medical Record Number: [id number] Patient Account Number: [id number] Date of Birth/Sex: [birth year] (68 y.o. F) Treating RN: Cornell Barman Primary Care Lakeisha Waldrop: Crist Infante Other Clinician: Referring Chaun Uemura: Crist Infante Treating Mabeline Varas/Extender: Melburn Hake, HOYT Weeks in Treatment: 14 Visit Information History Since Last Visit Added or deleted any medications: No Patient Arrived: Ambulatory Any new allergies or adverse reactions: No Arrival Time: 10:51 Had a fall or experienced change in No Accompanied By: self activities of daily living that may affect Transfer Assistance: None risk of falls: Patient Identification Verified: Yes Signs or symptoms of abuse/neglect since last visito No Secondary Verification Process Yes Hospitalized since last visit: No Completed: Has Dressing in Place as Prescribed: Yes Patient Has Alerts: Yes Pain Present Now: No Patient Alerts: Patient on Blood Thinner Eliquis Electronic Signature(s) Signed: 12/23/2018 4:30:23 PM By: Gretta Cool, BSN, RN, CWS, [name] RN, BSN Entered By: Gretta Cool, BSN, RN, CWS, [name] on 12/23/2018 10:51:55 [name] ([id number]) -------------------------------------------------------------------------------- Encounter Discharge Information Details Patient Name: KELSEA, MOUSEL. Date of Service: 12/23/2018 10:45 AM Medical Record Number: [id number] Patient Account Number: [id number] Date of Birth/Sex: 01-02-1951 (68 y.o. F) Treating RN: Harold Barban Primary Care Roselyne Stalnaker: Crist Infante Other Clinician: Referring Nicholad Kautzman: Crist Infante Treating Ollin Hochmuth/Extender: Melburn Hake, HOYT Weeks in Treatment: 14 Encounter Discharge Information Items Post Procedure Vitals Discharge Condition: Stable Temperature (F): 98.3 Ambulatory Status: Ambulatory Pulse (bpm): 86 Discharge Destination:  Home Respiratory Rate (breaths/min): 16 Transportation: Private Auto Blood Pressure (mmHg): 113/62 Accompanied By: self Schedule Follow-up Appointment: Yes Clinical Summary of Care: Electronic Signature(s) Signed: 12/23/2018 11:23:57 AM By: Harold Barban Entered By: Harold Barban on 12/23/2018 11:23:56 [name] ([id number]) -------------------------------------------------------------------------------- Lower Extremity Assessment Details Patient Name: SARANDA, LEGRANDE. Date of Service: 12/23/2018 10:45 AM Medical Record Number: [id number] Patient Account Number: [id number] Date of Birth/Sex: July 26, 1951 (68 y.o. F) Treating RN: Cornell Barman Primary Care Hattie Aguinaldo: Crist Infante Other Clinician: Referring Krislyn Donnan: Crist Infante Treating Alicianna Litchford/Extender: Melburn Hake, HOYT Weeks in Treatment: 14 Edema Assessment Assessed: [Left: No] [Right: No] [Left: Edema] [Right: :] Calf Left: Right: Point of Measurement: 32 cm From Medial Instep cm 36 cm Ankle Left: Right: Point of Measurement: 12 cm From Medial Instep cm 22 cm Vascular Assessment Pulses: Dorsalis Pedis Palpable: [Right:Yes] Electronic Signature(s) Signed: 12/23/2018 4:30:23 PM By: Gretta Cool, BSN, RN, CWS, [name] RN, BSN Entered By: Gretta Cool, BSN, RN, CWS, [name] on 12/23/2018 11:00:04 [name] ([id number]) -------------------------------------------------------------------------------- Multi Wound Chart Details Patient Name: ANAYIAH, HOWDEN. Date of Service: 12/23/2018 10:45 AM Medical Record Number: [id number] Patient Account Number: [id number] Date of Birth/Sex: 30-May-1951 (68 y.o. F) Treating RN: Harold Barban Primary Care Kalleigh [place]: Crist Infante Other Clinician: Referring Sontee Desena: Crist Infante Treating Krizia Flight/Extender: Melburn Hake, HOYT Weeks in Treatment: 14 Vital Signs Height(in): 63 Pulse(bpm): 86 Weight(lbs): 174 Blood Pressure(mmHg): 113/62 Body Mass Index(BMI): 31 Temperature(F): 98.3 Respiratory  Rate 16 (breaths/min): Photos: [N/A:N/A] Wound Location: Right Lower Leg - Lateral N/A N/A Wounding Event: Bump N/A N/A Primary Etiology: Auto-immune N/A N/A Comorbid History: Arrhythmia, Deep Vein N/A N/A Thrombosis, Hypertension, Osteoarthritis, Received Chemotherapy Date Acquired: 08/01/2018 N/A N/A Weeks of Treatment: 14 N/A N/A Wound Status: Open N/A N/A Measurements L x W x D 1x0.3x0.1 N/A N/A (cm) Area (cm) : 0.236 N/A N/A Volume (cm) : 0.024 N/A N/A % Reduction in Area: 94.50% N/A N/A % Reduction in Volume: 98.90% N/A N/A Classification: Full Thickness Without N/A N/A  Exposed Support Structures Exudate Amount: Small N/A N/A Exudate Type: Serous N/A N/A Exudate Color: amber N/A N/A Wound Margin: Flat and Intact N/A N/A Granulation Amount: Small (1-33%) N/A N/A Granulation Quality: Pink N/A N/A Necrotic Amount: Large (67-100%) N/A N/A Exposed Structures: Fat Layer (Subcutaneous N/A N/A Tissue) Exposed: Yes Fascia: No Tendon: No CARELY, NAPPIER. ([id number]) Muscle: No Joint: No Bone: No Epithelialization: Small (1-33%) N/A N/A Periwound Skin Texture: Induration: Yes N/A N/A Scarring: Yes Excoriation: No Callus: No Crepitus: No Rash: No Periwound Skin Moisture: Dry/Scaly: Yes N/A N/A Maceration: No Periwound Skin Color: Erythema: Yes N/A N/A Atrophie Blanche: No Cyanosis: No Ecchymosis: No Hemosiderin Staining: No Mottled: No Pallor: No Rubor: No Erythema Location: Circumferential N/A N/A Temperature: No Abnormality N/A N/A Tenderness on Palpation: Yes N/A N/A Treatment Notes Electronic Signature(s) Signed: 12/26/2018 8:05:03 AM By: Harold Barban Entered By: Harold Barban on 12/23/2018 11:06:37 [name] ([id number]) -------------------------------------------------------------------------------- Multi-Disciplinary Care Plan Details Patient Name: CHALESE, PEACH. Date of Service: 12/23/2018 10:45 AM Medical Record Number:  [id number] Patient Account Number: [id number] Date of Birth/Sex: 08-16-1951 (68 y.o. F) Treating RN: Harold Barban Primary Care Gregory Barrick: Crist Infante Other Clinician: Referring Mardy Hoppe: Crist Infante Treating Vaness Jelinski/Extender: Melburn Hake, HOYT Weeks in Treatment: 14 Active Inactive Necrotic Tissue Nursing Diagnoses: Impaired tissue integrity related to necrotic/devitalized tissue Knowledge deficit related to management of necrotic/devitalized tissue Goals: Necrotic/devitalized tissue will be minimized in the wound bed Date Initiated: 09/12/2018 Target Resolution Date: 12/21/2018 Goal Status: Active Interventions: Assess patient pain level pre-, during and post procedure and prior to discharge Provide education on necrotic tissue and debridement process Treatment Activities: Apply topical anesthetic as ordered : 09/12/2018 Notes: Orientation to the Wound Care Program Nursing Diagnoses: Knowledge deficit related to the wound healing center program Goals: Patient/caregiver will verbalize understanding of the [place] Date Initiated: 09/12/2018 Target Resolution Date: 12/21/2018 Goal Status: Active Interventions: Provide education on orientation to the wound center Notes: Pain, Acute or Chronic Nursing Diagnoses: Pain Management - Non-cyclic Acute (Procedural) Goals: Patient will verbalize adequate pain control and receive pain control interventions during procedures as needed AIRAM, RUNIONS ([id number]) Date Initiated: 09/12/2018 Target Resolution Date: 12/21/2018 Goal Status: Active Interventions: Assess comfort goal upon admission Encourage patient to take pain medications as prescribed Treatment Activities: Administer pain control measures as ordered : 09/12/2018 Notes: Wound/Skin Impairment Nursing Diagnoses: Impaired tissue integrity Goals: Ulcer/skin breakdown will have a volume reduction of 30% by week 4 Date Initiated: 09/12/2018 Target Resolution  Date: 12/21/2018 Goal Status: Active Interventions: Assess patient/caregiver ability to obtain necessary supplies Assess patient/caregiver ability to perform ulcer/skin care regimen upon admission and as needed Assess ulceration(s) every visit Treatment Activities: Referred to DME Hershall Benkert for dressing supplies : 09/12/2018 Skin care regimen initiated : 09/12/2018 Notes: Electronic Signature(s) Signed: 12/26/2018 8:05:03 AM By: Harold Barban Entered By: Harold Barban on 12/23/2018 [address], [place] J. ([id number]) -------------------------------------------------------------------------------- Pain Assessment Details Patient Name: KENNIYAH, SASAKI. Date of Service: 12/23/2018 10:45 AM Medical Record Number: [id number] Patient Account Number: [id number] Date of Birth/Sex: 01-22-1951 (68 y.o. F) Treating RN: Cornell Barman Primary Care Julea Hutto: Crist Infante Other Clinician: Referring Blakelee Allington: Crist Infante Treating Khalik Pewitt/Extender: Melburn Hake, HOYT Weeks in Treatment: 14 Active Problems Location of Pain Severity and Description of Pain Patient Has Paino No Site Locations Pain Management and Medication Current Pain Management: Notes Patient denies pain at this time. Did have some discomfort last night. Electronic Signature(s) Signed: 12/23/2018 4:30:23 PM By: Gretta Cool, BSN, RN, CWS, [name] RN, BSN  Entered By: Gretta Cool, BSN, RN, CWS, [name] on 12/23/2018 10:52:38 [name] ([id number]) -------------------------------------------------------------------------------- Patient/Caregiver Education Details Patient Name: BRISEYDA, FEHR. Date of Service: 12/23/2018 10:45 AM Medical Record Number: [id number] Patient Account Number: [id number] Date of Birth/Gender: July 23, 1951 (68 y.o. F) Treating RN: Harold Barban Primary Care Physician: Crist Infante Other Clinician: Referring Physician: Crist Infante Treating Physician/Extender: Sharalyn Ink in Treatment: 14 Education Assessment Education  Provided To: Patient Education Topics Provided Wound/Skin Impairment: Handouts: Caring for Your Ulcer Methods: Demonstration, Explain/Verbal Responses: State content correctly Electronic Signature(s) Signed: 12/26/2018 8:05:03 AM By: Harold Barban Entered By: Harold Barban on 12/23/2018 11:11:46 [name] ([id number]) -------------------------------------------------------------------------------- Wound Assessment Details Patient Name: ILAYDA, TODA. Date of Service: 12/23/2018 10:45 AM Medical Record Number: [id number] Patient Account Number: [id number] Date of Birth/Sex: 10/31/1950 (68 y.o. F) Treating RN: Cornell Barman Primary Care Prudencio Velazco: Crist Infante Other Clinician: Referring Karem Farha: Crist Infante Treating Tito Ausmus/Extender: Melburn Hake, HOYT Weeks in Treatment: 14 Wound Status Wound Number: 1 Primary Auto-immune Etiology: Wound Location: Right Lower Leg - Lateral Wound Open Wounding Event: Bump Status: Date Acquired: 08/01/2018 Comorbid Arrhythmia, Deep Vein Thrombosis, Weeks Of Treatment: 14 History: Hypertension, Osteoarthritis, Received Clustered Wound: No Chemotherapy Photos Wound Measurements Length: (cm) 1 Width: (cm) 0.3 Depth: (cm) 0.1 Area: (cm) 0.236 Volume: (cm) 0.024 % Reduction in Area: 94.5% % Reduction in Volume: 98.9% Epithelialization: Small (1-33%) Tunneling: No Undermining: No Wound Description Full Thickness Without Exposed Support Foul Od Classification: Structures Slough/ Wound Margin: Flat and Intact Exudate Small Amount: Exudate Type: Serous Exudate Color: amber or After Cleansing: No Fibrino Yes Wound Bed Granulation Amount: Small (1-33%) Exposed Structure Granulation Quality: Pink Fascia Exposed: No Necrotic Amount: Large (67-100%) Fat Layer (Subcutaneous Tissue) Exposed: Yes Necrotic Quality: Adherent Slough Tendon Exposed: No Muscle Exposed: No Joint Exposed: No Bone Exposed: No Lutterman, [name]  ([id number]) Periwound Skin Texture Texture Color No Abnormalities Noted: No No Abnormalities Noted: Yes Callus: No Erythema Location: Circumferential Crepitus: No Temperature / Pain Excoriation: No Temperature: No Abnormality Induration: Yes Tenderness on Palpation: Yes Rash: No Scarring: Yes Moisture No Abnormalities Noted: No Dry / Scaly: Yes Maceration: No Treatment Notes Wound #1 (Right, Lateral Lower Leg) Notes Silver collagen to just the 2 spots open within the wound bed, apply xeroform on top of wound, ABD, conform, Tubigrip Electronic Signature(s) Signed: 12/23/2018 4:30:23 PM By: Gretta Cool, BSN, RN, CWS, [name] RN, BSN Entered By: Gretta Cool, BSN, RN, CWS, [name] on 12/23/2018 10:59:04 [name] ([id number]) -------------------------------------------------------------------------------- Vitals Details Patient Name: [name]. Date of Service: 12/23/2018 10:45 AM Medical Record Number: [id number] Patient Account Number: [id number] Date of Birth/Sex: 05-Feb-1951 (68 y.o. F) Treating RN: Cornell Barman Primary Care Christene Pounds: Crist Infante Other Clinician: Referring Givanni Staron: Crist Infante Treating Aeryn Medici/Extender: Melburn Hake, HOYT Weeks in Treatment: 14 Vital Signs Time Taken: 10:55 Temperature (F): 98.3 Height (in): 63 Pulse (bpm): 86 Weight (lbs): 174 Respiratory Rate (breaths/min): 16 Body Mass Index (BMI): 30.8 Blood Pressure (mmHg): 113/62 Reference Range: 80 - 120 mg / dl Electronic Signature(s) Signed: 12/23/2018 4:30:23 PM By: Gretta Cool, BSN, RN, CWS, [name] RN, BSN Entered By: Gretta Cool, BSN, RN, CWS, [name] on 12/23/2018 10:56:19

## 2018-12-26 NOTE — Progress Notes (Signed)
[name], [name] ([id number]) Visit Report for 12/23/2018 Chief Complaint Document Details Patient Name: [name], [name]. Date of Service: 12/23/2018 10:45 AM Medical Record Number: [id number] Patient Account Number: [id number] Date of Birth/Sex: [birth year] (68 y.o. F) Treating RN: Harold Barban Primary Care Provider: Crist Infante Other Clinician: Referring Provider: Crist Infante Treating Provider/Extender: Melburn Hake, Zamora Colton Weeks in Treatment: 14 Information Obtained from: Patient Chief Complaint Right LE Ulcer Electronic Signature(s) Signed: 12/23/2018 4:49:37 PM By: Worthy Keeler PA-C Entered By: Worthy Keeler on 12/23/2018 11:03:47 [name] ([id number]) -------------------------------------------------------------------------------- Debridement Details Patient Name: [name] Date of Service: 12/23/2018 10:45 AM Medical Record Number: [id number] Patient Account Number: [id number] Date of Birth/Sex: 1950-10-17 (68 y.o. F) Treating RN: Harold Barban Primary Care Provider: Crist Infante Other Clinician: Referring Provider: Crist Infante Treating Provider/Extender: Melburn Hake, Cato Liburd Weeks in Treatment: 14 Debridement Performed for Wound #1 Right,Lateral Lower Leg Assessment: Performed By: Physician STONE III, Daniil Labarge E., PA-C Debridement Type: Debridement Level of Consciousness (Pre- Awake and Alert procedure): Pre-procedure Verification/Time Yes - 11:05 Out Taken: Start Time: 11:05 Pain Control: Lidocaine Total Area Debrided (L x W): 0.2 (cm) x 1 (cm) = 0.2 (cm) Tissue and other material Viable, Non-Viable, Slough, Subcutaneous, Slough debrided: Level: Skin/Subcutaneous Tissue Debridement Description: Excisional Instrument: Curette Bleeding: None End Time: 11:08 Procedural Pain: 0 Post Procedural Pain: 0 Response to Treatment: Procedure was tolerated well Level of Consciousness Awake and Alert (Post-procedure): Post Debridement Measurements of Total  Wound Length: (cm) 1.8 Width: (cm) 0.5 Depth: (cm) 0.2 Volume: (cm) 0.141 Character of Wound/Ulcer Post Debridement: Improved Post Procedure Diagnosis Same as Pre-procedure Electronic Signature(s) Signed: 12/23/2018 4:49:37 PM By: Worthy Keeler PA-C Signed: 12/26/2018 8:05:03 AM By: Harold Barban Entered By: Harold Barban on 12/23/2018 11:11:06 [name] ([id number]) -------------------------------------------------------------------------------- HPI Details Patient Name: [name], [name]. Date of Service: 12/23/2018 10:45 AM Medical Record Number: [id number] Patient Account Number: [id number] Date of Birth/Sex: 10/03/1950 (68 y.o. 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 She states that she often has areas like this that will come up but normally not to the severity. She's typically able to take care of these herself. However she's been having a lot of trouble since this past summer in particular. 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 She was most recently treated with this by her primary care provider. Fortunately that seems to have cleared up any infection that she may have had. Nonetheless other pertinent medical history includes the chronic long-term use of Eliquis secondary to atrial fibrillation. Her ABI appear to be okay today. She did have a DVT 13 years ago and this lag but has not had anything recently. The Eliquis seems to be beneficial in this regard. Otherwise there's no evidence of active infection at this time. No fevers, chills, nausea, or vomiting  noted at this time. 09/19/18 on evaluation today patient presents for follow-up concerning her ongoing lower extremity wound. This is her second visit here in our clinic. She does have slough covering the surface of the wound today. Fortunately there is no signs of infection at this time. Overall I have been pleased with the interval change although  she still has a lot of [place] I do feel like the Iodoflex was of benefit for her. We did have to change her wrap once in the weeks since I last saw her fortunately after that changed nothing seem to smell or drain through. 09/27/18 on evaluation today patient appears to be doing better in regard to her right lateral lower extremity ulcer. She's been tolerating the dressing changes without complication. Fortunately there is no sign of infection at this time. With that being said we have had to change the wrap at least once in the interim between when we see her and when she comes back in for evaluation due to the amount of drainage an odor. With that being said she is getting very frustrated with the length of time is taken get this to heal. Fortunately there is no evidence of anything worsening and a lot of the slough is improving she has good granulation seems to be peeking out from underneath. She is still having discomfort unfortunately. 10/03/18 on evaluation today patient's wound actually appears to be doing better from the standpoint of feeling in. With that being said she actually does have some slough covering the surface of the wound unfortunately. This is going to require sharp debridement prior to application of EpiFix. 10/15/18 on evaluation today patient actually appears to be doing much better in regard to her wound. I do believe that the EpiFix has been of benefit for her which is excellent news. Overall I think that this is something I would definitely recommend continuing I've seen definite progress. 10/22/18 on evaluation today patient  actually appears to be doing very well in regard to her leg ulcer. This is shown signs of excellent improvement measurement wise she is continuing to week by week make great progress in my pinion. Overall very pleased in this regard. Fortunately there's no signs of infection at this time. I do believe that the EpiFix has been beneficial for her. 10/29/18 on evaluation today patient actually appears to be doing very well today. The EpiFix does seem to be benefiting her as far as the overall improvement we have seen is concerned. Fortunately there's no signs of infection at this time and again her pain is much less than what it used to be. Overall I feel like she's making excellent progress. 11/12/18 on evaluation today patient presents after unfortunately having had a stroke since the last time I seen her until today. She tells me that she's actually doing very well in this regard she did have some rehab in the hospital and then subsequently was discharged to home. She is home at this point. Nonetheless she has physical therapy a potential occupational therapy is gonna be coming out to see her. Nonetheless she tells me that she is not having any evidence at this time of active infection which is good news she's not having any evidence of pain either in regard to the right lateral lower extremity. Overall I feel like she has done well as far as I'm concerned in the EpiFix which did stay in place for longer than the week obviously I think still have good effect based on what I'm seeing at this point the wound is significantly smaller. [name], [name] ([id number]) 11/18/18 on evaluation today patient comes in one day early for evaluation here in our clinic due to issues that she's having with her right lower extremity she states she was having a lot of discomfort and could not wait any longer to have  this checked. Fortunately she did come in because she's actually having what appears to be significant  infection which needs to be addressed I did not want this to continue without being addressed. No fevers, chills, nausea, or vomiting noted at this time. 11/26/18 on evaluation today patient will significantly better compared to last week regarding her lower extremity ulcer. Last week she had an infection we finally did get the results of the culture back which did show methicillin-resistant Staphylococcus aureus as the positive organism. Subsequently the antibiotic that she's been on has completely resolved the infection as best I can tell she seems to be doing absolutely excellent today. I'm very pleased in this regard. She's having little to no drainage and no pain. Last week and this was very painful. 12/03/18 on evaluation today patient appears to be doing rather well in regard to her lateral lower extremity ulcer on the right. She's been tolerating the dressing changes without complication. Fortunately the wound is shown signs of good improvement this week. The infection is in your much better control and I think were making excellent progress. 12/09/18 on evaluation today patient appears to be doing decently well in regard to her lower Trinity ulcer this point. Fortunately there's no signs of active infection at this time. Overall I'm very pleased with how things seem to be progressing. With that being said although I did want to see her today to ensure there's no signs of active infection I did want to also see about leaving the EpiFix in place for one additional week before complete removal. 12/16/18 on evaluation today patient actually appears to be doing very well in regard to her wound. She has some dressing material stuck to the surface the wound but other than this seems to be making good progress. There's no signs of active infection at this time which is good news. 12/23/18 on evaluation today patient appears to be doing rather well in regard to her lower extremity ulcer. The biggest thing  I'm seeing is that the Prisma still continues to get somewhat dry and stuck into the wound bed which I think is somewhat slowing things down as far as healing is concerned. Fortunately there's no signs of active infection at this time which is good news. Electronic Signature(s) Signed: 12/23/2018 4:49:37 PM By: Worthy Keeler PA-C Entered By: Worthy Keeler on 12/23/2018 13:38:39 [name] ([id number]) -------------------------------------------------------------------------------- Physical Exam Details Patient Name: [name], MUN. Date of Service: 12/23/2018 10:45 AM Medical Record Number: [id number] Patient Account Number: [id number] Date of Birth/Sex: March 25, 1951 (68 y.o. F) Treating RN: Harold Barban Primary Care Provider: Crist Infante Other Clinician: Referring Provider: Crist Infante Treating Provider/Extender: STONE III, Aleighna Wojtas Weeks in Treatment: 59 Constitutional Well-nourished and well-hydrated in no acute distress. Respiratory normal breathing without difficulty. Psychiatric this patient is able to make decisions and demonstrates good insight into disease process. Alert and Oriented x 3. pleasant and cooperative. Notes Patient's wound bed currently shows evidence of good granulation at this time there does not appear to be evidence of active infection and overall I feel like the patient has been making excellent progress at this point. Overall I'm very pleased. Sharp debridement was performed in order to clear away some of the necrotic material wound from the surface of the wound. She was able to tolerate this without significant pain post debridement wound bed appears to be doing well although I think that we still have an issue with becoming somewhat dry compared with what I would  like to see. Electronic Signature(s) Signed: 12/23/2018 4:49:37 PM By: Worthy Keeler PA-C Entered By: Worthy Keeler on 12/23/2018 13:39:11 [name]  ([id number]) -------------------------------------------------------------------------------- Physician Orders Details Patient Name: [name], MURIN. Date of Service: 12/23/2018 10:45 AM Medical Record Number: [id number] Patient Account Number: [id number] Date of Birth/Sex: [birth year] (68 y.o. 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 o May shower with protection. - Please do not get your wrap wet Anesthetic (add to Medication List) Wound #1 Right,Lateral Lower Leg o Topical Lidocaine 4% cream applied to wound bed prior to debridement (In Clinic Only). o Benzocaine Topical Anesthetic Spray applied to wound bed prior to debridement (In Clinic Only). Primary Wound Dressing Wound #1 Right,Lateral Lower Leg o Silver Collagen - Apply to the two small open areas of wound o Xeroform - Apply on top of wound Secondary Dressing Wound #1 Right,Lateral Lower Leg o Conform/Kerlix - Rolled conform to secure Dressing Change Frequency Wound #1 Right,Lateral Lower Leg o Other: - Change every three days Follow-up Appointments Wound #1 Right,Lateral Lower Leg o Return Appointment in 1 week. Edema Control Wound #1 Right,Lateral Lower Leg o Elevate legs to the level of the heart and pump ankles as often as possible o Other: [address] [place], [place] ([id number]) Electronic Signature(s) Signed: 12/23/2018 4:49:37  PM By: Worthy Keeler PA-C Signed: 12/26/2018 8:05:03 AM By: Harold Barban Entered By: Harold Barban on 12/23/2018 11:13:28 [name] ([id number]) -------------------------------------------------------------------------------- Problem List Details Patient Name: [name], [name]. Date of Service: 12/23/2018 10:45 AM Medical Record Number: [id number] Patient Account Number: [id number] Date of Birth/Sex: 1950/11/14 (68 y.o. 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 09/12/2018 No Yes L97.812 Non-pressure chronic ulcer of other part of right lower leg 09/12/2018 No Yes with fat layer exposed I48.0 Paroxysmal atrial fibrillation 09/12/2018 No Yes I10 Essential (primary) hypertension 09/12/2018 No Yes Z79.01 Long term (current) use of anticoagulants 09/12/2018 No Yes Inactive Problems Resolved Problems Electronic Signature(s) Signed: 12/23/2018 4:49:37 PM By: Worthy Keeler PA-C Entered By: Worthy Keeler on 12/23/2018 11:03:38 [name] ([id number]) -------------------------------------------------------------------------------- Progress Note Details Patient Name: [name] Date of Service: 12/23/2018 10:45 AM Medical Record Number: [id number] Patient Account Number: [id number] Date of Birth/Sex: Apr 06, 1951 (68 y.o. 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 She states that she often has areas like this that will come up but normally not to the severity. She's typically able to take care of these herself. However she's been having a lot of trouble  since this past summer in particular. 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 She was most recently treated with this by her primary care provider. Fortunately that seems to have cleared up any infection that she may have had. Nonetheless other pertinent medical history includes the chronic long-term use of Eliquis secondary to atrial fibrillation. Her ABI appear to be okay today. She did have a DVT 13 years ago and this lag but has not had anything recently. The Eliquis seems to be beneficial in this regard. Otherwise there's no evidence of active infection at this time. No fevers, chills, nausea, or vomiting noted at this time. 09/19/18 on evaluation today patient presents for follow-up concerning her ongoing lower extremity wound. This is her second visit here in our clinic. She does have slough covering the surface of the wound today. Fortunately there is no signs of infection at this time. Overall I have been pleased with the interval change although she still has a lot of Slough I do feel like the Iodoflex was of benefit for her. We did have to change her wrap once in the weeks since I last saw her fortunately after that changed nothing seem to smell or drain through. 09/27/18 on evaluation today patient appears to be doing better in regard to her right lateral lower extremity ulcer. She's been tolerating the dressing changes without complication. Fortunately there is no sign of infection at this time. With that being said we have had to change the wrap at least once in the interim between when we see her and when she comes back in for evaluation due to the  amount of drainage an odor. With that being said she is getting very frustrated with the length of time is taken get this to heal. Fortunately there is no evidence of anything worsening and a lot of the slough is improving she has good granulation seems to be peeking out from underneath. She is still having discomfort unfortunately. 10/03/18 on evaluation today patient's wound actually appears to be doing better from the standpoint of feeling in. With that being said she actually does have some slough covering the surface of the wound unfortunately. This is going to require sharp debridement prior to application of EpiFix. 10/15/18 on evaluation today patient actually appears to be doing much better in regard to her wound. I do believe that the EpiFix has been of benefit for her which is excellent news. Overall I think that this is something I would definitely recommend continuing I've seen definite progress. 10/22/18 on evaluation today patient actually appears to be doing very well in regard to her leg ulcer. This is shown signs of excellent improvement measurement wise she is continuing to week by week make great progress in my pinion. Overall very pleased in this regard. Fortunately there's no signs of infection at this time. I do believe that the EpiFix has been beneficial for her. 10/29/18 on evaluation today patient actually appears to be doing very well today. The EpiFix does seem to be benefiting her as far as the overall improvement we have seen is concerned. Fortunately there's no signs of infection at this time and again her pain is much less than what it used to be. Overall I feel like she's making excellent progress. 11/12/18 on evaluation today patient presents after unfortunately having had a stroke since the last time I seen her until today. [name], [name] ([id number]) She tells  me that she's actually doing very well in this regard she did have some rehab in the hospital and then  subsequently was discharged to home. She is home at this point. Nonetheless she has physical therapy a potential occupational therapy is gonna be coming out to see her. Nonetheless she tells me that she is not having any evidence at this time of active infection which is good news she's not having any evidence of pain either in regard to the right lateral lower extremity. Overall I feel like she has done well as far as I'm concerned in the EpiFix which did stay in place for longer than the week obviously I think still have good effect based on what I'm seeing at this point the wound is significantly smaller. 11/18/18 on evaluation today patient comes in one day early for evaluation here in our clinic due to issues that she's having with her right lower extremity she states she was having a lot of discomfort and could not wait any longer to have this checked. Fortunately she did come in because she's actually having what appears to be significant infection which needs to be addressed I did not want this to continue without being addressed. No fevers, chills, nausea, or vomiting noted at this time. 11/26/18 on evaluation today patient will significantly better compared to last week regarding her lower extremity ulcer. Last week she had an infection we finally did get the results of the culture back which did show methicillin-resistant Staphylococcus aureus as the positive organism. Subsequently the antibiotic that she's been on has completely resolved the infection as best I can tell she seems to be doing absolutely excellent today. I'm very pleased in this regard. She's having little to no drainage and no pain. Last week and this was very painful. 12/03/18 on evaluation today patient appears to be doing rather well in regard to her lateral lower extremity ulcer on the right. She's been tolerating the dressing changes without complication. Fortunately the wound is shown signs of good improvement this  week. The infection is in your much better control and I think were making excellent progress. 12/09/18 on evaluation today patient appears to be doing decently well in regard to her lower Trinity ulcer this point. Fortunately there's no signs of active infection at this time. Overall I'm very pleased with how things seem to be progressing. With that being said although I did want to see her today to ensure there's no signs of active infection I did want to also see about leaving the EpiFix in place for one additional week before complete removal. 12/16/18 on evaluation today patient actually appears to be doing very well in regard to her wound. She has some dressing material stuck to the surface the wound but other than this seems to be making good progress. There's no signs of active infection at this time which is good news. 12/23/18 on evaluation today patient appears to be doing rather well in regard to her lower extremity ulcer. The biggest thing I'm seeing is that the Prisma still continues to get somewhat dry and stuck into the wound bed which I think is somewhat slowing things down as far as healing is concerned. Fortunately there's no signs of active infection at this time which is good news. Patient History Information obtained from Patient. Family History Cancer - Father, Diabetes - Maternal Grandparents, Lung Disease - Father, Stroke - Father, No family history of Heart Disease, Hereditary Spherocytosis, Hypertension, Kidney Disease, Seizures, Thyroid Problems, Tuberculosis.  Social History Never smoker, Marital Status - Married, Alcohol Use - Never, Drug Use - No History, Caffeine Use - Daily. Medical History Eyes Denies history of Cataracts, Glaucoma, Optic Neuritis Ear/Nose/Mouth/Throat Denies history of Chronic sinus problems/congestion, Middle ear problems Hematologic/Lymphatic Denies history of Anemia, Hemophilia, Human Immunodeficiency Virus, Sickle Cell  Disease Respiratory Denies history of Aspiration, Asthma, Chronic Obstructive Pulmonary Disease (COPD), Pneumothorax, Sleep Apnea, Tuberculosis Cardiovascular Patient has history of Arrhythmia - a fib, Deep Vein Thrombosis - 13 years ago, Hypertension Denies history of Angina, Congestive Heart Failure, Coronary Artery Disease, Hypotension, Myocardial Infarction, Peripheral [name], [name] ([id number]) Arterial Disease, Peripheral Venous Disease, Phlebitis, Vasculitis Gastrointestinal Denies history of Cirrhosis , Colitis, Crohn s, Hepatitis A, Hepatitis B, Hepatitis C Endocrine Denies history of Type I Diabetes, Type II Diabetes Genitourinary Denies history of End Stage Renal Disease Immunological Denies history of Lupus Erythematosus, Raynaud s, Scleroderma Integumentary (Skin) Denies history of History of Burn, History of pressure wounds Musculoskeletal Patient has history of Osteoarthritis Denies history of Gout, Rheumatoid Arthritis, Osteomyelitis Neurologic Denies history of Dementia, Neuropathy, Quadriplegia, Paraplegia, Seizure Disorder Oncologic Patient has history of Received Chemotherapy - past for Wegeners Denies history of Received Radiation Psychiatric Denies history of Anorexia/bulimia, Confinement Anxiety Medical And Surgical History Notes Immunological Wegeners Musculoskeletal OP Review of Systems (ROS) Constitutional Symptoms (General Health) Denies complaints or symptoms of Fatigue, Fever, Chills, Marked Weight Change. Respiratory Denies complaints or symptoms of Chronic or frequent coughs, Shortness of Breath. Cardiovascular Complains or has symptoms of LE edema. Psychiatric Denies complaints or symptoms of Anxiety, Claustrophobia. Objective Constitutional Well-nourished and well-hydrated in no acute distress. Vitals Time Taken: 10:55 AM, Height: 63 in, Weight: 174 lbs, BMI: 30.8, Temperature: 98.3 F, Pulse: 86 bpm, Respiratory Rate: 16 breaths/min,  Blood Pressure: 113/62 mmHg. Respiratory normal breathing without difficulty. Psychiatric KANCHAN, GAL ([id number]) this patient is able to make decisions and demonstrates good insight into disease process. Alert and Oriented x 3. pleasant and cooperative. General Notes: Patient's wound bed currently shows evidence of good granulation at this time there does not appear to be evidence of active infection and overall I feel like the patient has been making excellent progress at this point. Overall I'm very pleased. Sharp debridement was performed in order to clear away some of the necrotic material wound from the surface of the wound. She was able to tolerate this without significant pain post debridement wound bed appears to be doing well although I think that we still have an issue with becoming somewhat dry compared with what I would like to see. Integumentary (Hair, Skin) Wound #1 status is Open. Original cause of wound was Bump. The wound is located on the Right,Lateral Lower Leg. The wound measures 1cm length x 0.3cm width x 0.1cm depth; 0.236cm^2 area and 0.024cm^3 volume. There is Fat Layer (Subcutaneous Tissue) Exposed exposed. There is no tunneling or undermining noted. There is a small amount of serous drainage noted. The wound margin is flat and intact. There is small (1-33%) pink granulation within the wound bed. There is a large (67-100%) amount of necrotic tissue within the wound bed including Adherent Slough. The periwound skin appearance had no abnormalities noted for color. The periwound skin appearance exhibited: Induration, Scarring, Dry/Scaly. The periwound skin appearance did not exhibit: Callus, Crepitus, Excoriation, Rash, Maceration. Periwound temperature was noted as No Abnormality. The periwound has tenderness on palpation. 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 There was a Excisional Skin/Subcutaneous Tissue Debridement with a total area of 0.2 sq cm performed by STONE III, Lemoine Goyne E., PA-C. With the following instrument(s): Curette to remove Viable and Non-Viable tissue/material. Material removed includes Subcutaneous Tissue and Slough and after achieving pain control using Lidocaine. No specimens were taken. A time out was conducted at 11:05, prior to the start of the procedure. There was no bleeding. The procedure was tolerated well with a pain level of 0 throughout and a pain level of 0 following the procedure. Post Debridement Measurements: 1.8cm length x 0.5cm width x 0.2cm depth; 0.141cm^3 volume. Character of Wound/Ulcer Post Debridement is improved. Post procedure Diagnosis Wound #1: Same as Pre-Procedure Plan [name], [name] ([id number]) Wound Cleansing: Wound #1 Right,Lateral Lower Leg: Clean wound with Normal Saline. May shower with protection. - Please do not get your wrap wet Anesthetic (add to Medication List): Wound #1 Right,Lateral Lower Leg: Topical Lidocaine 4% cream applied to wound bed prior to debridement (In Clinic Only). Benzocaine Topical Anesthetic Spray applied to wound bed prior to debridement (In Clinic Only). Primary Wound Dressing: Wound #1 Right,Lateral Lower Leg: Silver Collagen - Apply to the two small open areas of wound Xeroform - Apply on top of wound Secondary Dressing: Wound #1 Right,Lateral Lower Leg: Conform/Kerlix - Rolled conform to secure Dressing Change Frequency: Wound #1 Right,Lateral Lower Leg: Other: - Change every three days Follow-up Appointments: Wound #1 Right,Lateral Lower Leg: Return Appointment in 1 week. Edema Control: Wound #1  Right,Lateral Lower Leg: Elevate legs to the level of the heart and pump ankles as often as possible Other: - Tubi Grip E My suggestion is gonna be that we go ahead and continue with the above wound care measures for the next week although we will make the above changes in order to try to prevent any drying out in regard to the wound bed. If anything changes or worsens meantime she will contact the office and let me know. Please see above for specific wound care orders. We will see patient for re-evaluation in 1 week(s) here in the clinic. If anything worsens or changes patient will contact our office for additional recommendations. Electronic Signature(s) Signed: 12/23/2018 4:49:37 PM By: Worthy Keeler PA-C Entered By: Worthy Keeler on 12/23/2018 13:39:34 [name] ([id number]) -------------------------------------------------------------------------------- ROS/PFSH Details Patient Name: [name], [name]. Date of Service: 12/23/2018 10:45 AM Medical Record Number: [id number] Patient Account Number: [id number] Date of Birth/Sex: September 25, 1950 (68 y.o. F) Treating RN: Harold Barban Primary Care Provider: Crist Infante Other Clinician: Referring Provider: Crist Infante Treating Provider/Extender: Melburn Hake, Keily Lepp Weeks in Treatment: 14 Information Obtained From Patient Constitutional Symptoms (General Health) Complaints and Symptoms: Negative for: Fatigue; Fever; Chills; Marked Weight Change Respiratory Complaints and Symptoms: Negative for: Chronic or frequent coughs; Shortness of Breath Medical History: Negative for: Aspiration; Asthma; Chronic Obstructive Pulmonary Disease (COPD); Pneumothorax; Sleep Apnea; Tuberculosis Cardiovascular Complaints and Symptoms: Positive for: LE edema Medical History: Positive for: Arrhythmia - a fib; Deep Vein Thrombosis - 13 years ago; Hypertension Negative for: Angina; Congestive Heart Failure; Coronary Artery Disease; Hypotension; Myocardial  Infarction; Peripheral Arterial Disease; Peripheral Venous Disease; Phlebitis; Vasculitis Psychiatric Complaints and Symptoms: Negative for: Anxiety; Claustrophobia Medical History: Negative for: Anorexia/bulimia; Confinement Anxiety Eyes Medical History: Negative for: Cataracts; Glaucoma; Optic Neuritis Ear/Nose/Mouth/Throat Medical History: Negative  for: Chronic sinus problems/congestion; Middle ear problems Hematologic/Lymphatic Medical History: Negative for: Anemia; Hemophilia; Human Immunodeficiency Virus; Sickle Cell Disease LASHARN, BUFKIN ([id number]) Gastrointestinal Medical History: Negative for: Cirrhosis ; Colitis; Crohnos; Hepatitis A; Hepatitis B; Hepatitis C Endocrine Medical History: Negative for: Type I Diabetes; Type II Diabetes Genitourinary Medical History: Negative for: End Stage Renal Disease Immunological Medical History: Negative for: Lupus Erythematosus; Raynaudos; Scleroderma Past Medical History Notes: Wegeners Integumentary (Skin) Medical History: Negative for: History of Burn; History of pressure wounds Musculoskeletal Medical History: Positive for: Osteoarthritis Negative for: Gout; Rheumatoid Arthritis; Osteomyelitis Past Medical History Notes: OP Neurologic Medical History: Negative for: Dementia; Neuropathy; Quadriplegia; Paraplegia; Seizure Disorder Oncologic Medical History: Positive for: Received Chemotherapy - past for Wegeners Negative for: Received Radiation Immunizations Pneumococcal Vaccine: Received Pneumococcal Vaccination: Yes Implantable Devices No devices added Family and Social History Cancer: Yes - Father; Diabetes: Yes - Maternal Grandparents; Heart Disease: No; Hereditary Spherocytosis: No; Hypertension: No; Kidney Disease: No; Lung Disease: Yes - Father; Seizures: No; Stroke: Yes - Father; Thyroid Problems: No; Tuberculosis: No; Never smoker; Marital Status - Married; Alcohol Use: Never; Drug Use: No History;  Caffeine Use: Daily; Financial Concerns: No; Food, Clothing or Shelter Needs: No; Support System Lacking: No; Transportation Concerns: No KADAJAH, KJOS ([id number]) Electronic Signature(s) Signed: 12/23/2018 4:49:37 PM By: Worthy Keeler PA-C Signed: 12/26/2018 8:05:03 AM By: Harold Barban Entered By: Worthy Keeler on 12/23/2018 13:38:54 Fodor, [name] ([id number]) -------------------------------------------------------------------------------- SuperBill Details Patient Name: JUDIANNE, SEIPLE. Date of Service: 12/23/2018 Medical Record Number: [id number] Patient Account Number: [id number] Date of Birth/Sex: 03/22/1951 (68 y.o. 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 with fat layer exposed I48.0 Paroxysmal atrial fibrillation I10 Essential (primary) hypertension Z79.01 Long term (current) use of anticoagulants Facility Procedures CPT4 Code Description: 97989211 11042 - DEB SUBQ TISSUE 20 SQ CM/< ICD-10 Diagnosis Description H41.740 Non-pressure chronic ulcer of other part of right lower leg wi Modifier: th fat layer expo Quantity: 1 sed Physician Procedures CPT4 Code Description: 8144818 11042 - WC PHYS SUBQ TISS 20 SQ CM ICD-10 Diagnosis Description H63.149 Non-pressure chronic ulcer of other part of right lower leg wi Modifier: th fat layer expo Quantity: 1 sed Electronic Signature(s) Signed: 12/23/2018 4:49:37 PM By: Worthy Keeler PA-C Entered By: Worthy Keeler on 12/23/2018 13:40:32

## 2018-12-30 ENCOUNTER — Encounter: Payer: Medicare Other | Admitting: Physician Assistant

## 2018-12-30 ENCOUNTER — Other Ambulatory Visit: Payer: Self-pay

## 2018-12-30 DIAGNOSIS — Z86718 Personal history of other venous thrombosis and embolism: Secondary | ICD-10-CM | POA: Diagnosis not present

## 2018-12-30 DIAGNOSIS — L97812 Non-pressure chronic ulcer of other part of right lower leg with fat layer exposed: Secondary | ICD-10-CM | POA: Diagnosis not present

## 2018-12-30 DIAGNOSIS — Z7901 Long term (current) use of anticoagulants: Secondary | ICD-10-CM | POA: Diagnosis not present

## 2018-12-30 DIAGNOSIS — M313 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: Secondary | ICD-10-CM | POA: Diagnosis not present

## 2018-12-30 DIAGNOSIS — I10 Essential (primary) hypertension: Secondary | ICD-10-CM | POA: Diagnosis not present

## 2018-12-30 DIAGNOSIS — I480 Paroxysmal atrial fibrillation: Secondary | ICD-10-CM | POA: Diagnosis not present

## 2018-12-30 DIAGNOSIS — M359 Systemic involvement of connective tissue, unspecified: Secondary | ICD-10-CM | POA: Diagnosis not present

## 2018-12-30 NOTE — Progress Notes (Signed)
Joyce, Kaufman ([id number]) Visit Report for 12/30/2018 Arrival Information Details Patient Name: [name], [name]. Date of Service: 12/30/2018 12:30 PM Medical Record Number: [id number] Patient Account Number: [id number] Date of Birth/Sex: 06-17-1951 (68 y.o. F) Treating RN: Harold Barban Primary Care Elasha Tess: Crist Infante Other Clinician: Referring Dlynn Ranes: Crist Infante Treating Bianco Cange/Extender: Melburn Hake, HOYT Weeks in Treatment: 15 Visit Information History Since Last Visit Added or deleted any medications: Yes Patient Arrived: Ambulatory Any new allergies or adverse reactions: No Arrival Time: 12:51 Had a fall or experienced change in No Accompanied By: self activities of daily living that may affect Transfer Assistance: None risk of falls: Patient Identification Verified: Yes Signs or symptoms of abuse/neglect since last visito No Secondary Verification Process Yes Hospitalized since last visit: No Completed: Implantable device outside of the clinic excluding No Patient Has Alerts: Yes cellular tissue based products placed in the center Patient Alerts: Patient on Blood since last visit: Thinner Has Dressing in Place as Prescribed: Yes Eliquis Pain Present Now: No Electronic Signature(s) Signed: 12/30/2018 2:45:31 PM By: Lorine Bears RCP, RRT, CHT Entered By: Lorine Bears on 12/30/2018 12:52:30 [name] ([id number]) -------------------------------------------------------------------------------- Encounter Discharge Information Details Patient Name: Joyce, Kaufman. Date of Service: 12/30/2018 12:30 PM Medical Record Number: [id number] Patient Account Number: [id number] Date of Birth/Sex: 07/03/1951 (69 y.o. F) Treating RN: Harold Barban Primary Care Lauraine Crespo: Crist Infante Other Clinician: Referring Nelle Sayed: Crist Infante Treating Domenik Trice/Extender: Melburn Hake, HOYT Weeks in Treatment: 15 Encounter Discharge Information Items  Post Procedure Vitals Discharge Condition: Stable Temperature (F): 98.3 Ambulatory Status: Ambulatory Pulse (bpm): 86 Discharge Destination: Home Respiratory Rate (breaths/min): 16 Transportation: Private Auto Blood Pressure (mmHg): 108/55 Accompanied By: self Schedule Follow-up Appointment: Yes Clinical Summary of Care: Electronic Signature(s) Signed: 12/30/2018 4:53:18 PM By: Harold Barban Entered By: Harold Barban on 12/30/2018 13:41:15 [name] ([id number]) -------------------------------------------------------------------------------- Lower Extremity Assessment Details Patient Name: Joyce, Kaufman. Date of Service: 12/30/2018 12:30 PM Medical Record Number: [id number] Patient Account Number: [id number] Date of Birth/Sex: 1951/03/06 (68 y.o. F) Treating RN: Harold Barban Primary Care Luretta Everly: Crist Infante Other Clinician: Referring Cuma Polyakov: Crist Infante Treating Zaylin Runco/Extender: Melburn Hake, HOYT Weeks in Treatment: 15 Edema Assessment Assessed: [Left: No] [Right: No] Edema: [Left: Ye] [Right: s] Calf Left: Right: Point of Measurement: 32 cm From Medial Instep cm 36.3 cm Ankle Left: Right: Point of Measurement: 12 cm From Medial Instep cm 23 cm Vascular Assessment Pulses: Dorsalis Pedis Palpable: [Right:Yes] Electronic Signature(s) Signed: 12/30/2018 4:53:18 PM By: Harold Barban Entered By: Harold Barban on 12/30/2018 13:13:36 Tomassetti, Ulyses Southward ([id number]) -------------------------------------------------------------------------------- Multi Wound Chart Details Patient Name: [name]. Date of Service: 12/30/2018 12:30 PM Medical Record Number: [id number] Patient Account Number: [id number] Date of Birth/Sex: 1950-11-06 (68 y.o. F) Treating RN: Harold Barban Primary Care Adrena Nakamura: Crist Infante Other Clinician: Referring Chiyo Fay: Crist Infante Treating Carlo Lorson/Extender: Melburn Hake, HOYT Weeks in Treatment: 15 Vital Signs Height(in):  63 Pulse(bpm): 86 Weight(lbs): 174 Blood Pressure(mmHg): 108/55 Body Mass Index(BMI): 31 Temperature(F): 98.3 Respiratory Rate 16 (breaths/min): Photos: [N/A:N/A] Wound Location: Right, Lateral Lower Leg N/A N/A Wounding Event: Bump N/A N/A Primary Etiology: Auto-immune N/A N/A Comorbid History: Arrhythmia, Deep Vein N/A N/A Thrombosis, Hypertension, Osteoarthritis, Received Chemotherapy Date Acquired: 08/01/2018 N/A N/A Weeks of Treatment: 15 N/A N/A Wound Status: Open N/A N/A Measurements L x W x D 0.5x0.3x0.1 N/A N/A (cm) Area (cm) : 0.118 N/A N/A Volume (cm) : 0.012 N/A N/A % Reduction in Area: 97.30% N/A N/A % Reduction in Volume: 99.40%  N/A N/A Classification: Full Thickness Without N/A N/A Exposed Support Structures Exudate Amount: Small N/A N/A Exudate Type: Serous N/A N/A Exudate Color: amber N/A N/A Wound Margin: Flat and Intact N/A N/A Granulation Amount: Small (1-33%) N/A N/A Granulation Quality: Pink N/A N/A Necrotic Amount: Large (67-100%) N/A N/A Exposed Structures: Fat Layer (Subcutaneous N/A N/A Tissue) Exposed: Yes Fascia: No Tendon: No YARA, TOMKINSON. ([id number]) Muscle: No Joint: No Bone: No Epithelialization: Small (1-33%) N/A N/A Periwound Skin Texture: Induration: Yes N/A N/A Scarring: Yes Excoriation: No Callus: No Crepitus: No Rash: No Periwound Skin Moisture: Dry/Scaly: Yes N/A N/A Maceration: No Periwound Skin Color: Erythema: Yes N/A N/A Atrophie Blanche: No Cyanosis: No Ecchymosis: No Hemosiderin Staining: No Mottled: No Pallor: No Rubor: No Erythema Location: Circumferential N/A N/A Temperature: No Abnormality N/A N/A Tenderness on Palpation: Yes N/A N/A Treatment Notes Electronic Signature(s) Signed: 12/30/2018 4:53:18 PM By: Harold Barban Entered By: Harold Barban on 12/30/2018 13:25:33 [name]  ([id number]) -------------------------------------------------------------------------------- Multi-Disciplinary Care Plan Details Patient Name: Kaufman, Joyce. Date of Service: 12/30/2018 12:30 PM Medical Record Number: [id number] Patient Account Number: [id number] Date of Birth/Sex: Jan 19, 1951 (68 y.o. F) Treating RN: Harold Barban Primary Care Chaunda Vandergriff: Crist Infante Other Clinician: Referring Basim Bartnik: Crist Infante Treating Niklaus Mamaril/Extender: Melburn Hake, HOYT Weeks in Treatment: 15 Active Inactive Necrotic Tissue Nursing Diagnoses: Impaired tissue integrity related to necrotic/devitalized tissue Knowledge deficit related to management of necrotic/devitalized tissue Goals: Necrotic/devitalized tissue will be minimized in the wound bed Date Initiated: 09/12/2018 Target Resolution Date: 12/21/2018 Goal Status: Active Interventions: Assess patient pain level pre-, during and post procedure and prior to discharge Provide education on necrotic tissue and debridement process Treatment Activities: Apply topical anesthetic as ordered : 09/12/2018 Notes: Orientation to the Wound Care Program Nursing Diagnoses: Knowledge deficit related to the wound healing center program Goals: Patient/caregiver will verbalize understanding of the [place] Date Initiated: 09/12/2018 Target Resolution Date: 12/21/2018 Goal Status: Active Interventions: Provide education on orientation to the wound center Notes: Pain, Acute or Chronic Nursing Diagnoses: Pain Management - Non-cyclic Acute (Procedural) Goals: Patient will verbalize adequate pain control and receive pain control interventions during procedures as needed MARIELYS, TRINIDAD ([id number]) Date Initiated: 09/12/2018 Target Resolution Date: 12/21/2018 Goal Status: Active Interventions: Assess comfort goal upon admission Encourage patient to take pain medications as prescribed Treatment Activities: Administer pain control measures  as ordered : 09/12/2018 Notes: Wound/Skin Impairment Nursing Diagnoses: Impaired tissue integrity Goals: Ulcer/skin breakdown will have a volume reduction of 30% by week 4 Date Initiated: 09/12/2018 Target Resolution Date: 12/21/2018 Goal Status: Active Interventions: Assess patient/caregiver ability to obtain necessary supplies Assess patient/caregiver ability to perform ulcer/skin care regimen upon admission and as needed Assess ulceration(s) every visit Treatment Activities: Referred to DME Eligh Rybacki for dressing supplies : 09/12/2018 Skin care regimen initiated : 09/12/2018 Notes: Electronic Signature(s) Signed: 12/30/2018 4:53:18 PM By: Harold Barban Entered By: Harold Barban on 12/30/2018 13:25:24 [name] ([id number]) -------------------------------------------------------------------------------- Pain Assessment Details Patient Name: EDITHE, DOBBIN. Date of Service: 12/30/2018 12:30 PM Medical Record Number: [id number] Patient Account Number: [id number] Date of Birth/Sex: 1950/09/25 (68 y.o. F) Treating RN: Harold Barban Primary Care Leyani Gargus: Crist Infante Other Clinician: Referring Corion Sherrod: Crist Infante Treating Ioannis Schuh/Extender: Melburn Hake, HOYT Weeks in Treatment: 15 Active Problems Location of Pain Severity and Description of Pain Patient Has Paino No Site Locations Pain Management and Medication Current Pain Management: Electronic Signature(s) Signed: 12/30/2018 2:45:31 PM By: Lorine Bears RCP, RRT, CHT Signed: 12/30/2018 4:53:18 PM By: Oretha Ellis  Carmell Austria Entered By: Lorine Bears on 12/30/2018 12:52:38 [name] ([id number]) -------------------------------------------------------------------------------- Patient/Caregiver Education Details Patient Name: MARLYS, STEGMAIER Date of Service: 12/30/2018 12:30 PM Medical Record Number: [id number] Patient Account Number: [id number] Date of Birth/Gender: [birth year] (68 y.o.  F) Treating RN: Harold Barban Primary Care Physician: Crist Infante Other Clinician: Referring Physician: Crist Infante Treating Physician/Extender: Sharalyn Ink in Treatment: 15 Education Assessment Education Provided To: Patient Education Topics Provided Wound/Skin Impairment: Handouts: Caring for Your Ulcer Methods: Demonstration, Explain/Verbal Responses: State content correctly Electronic Signature(s) Signed: 12/30/2018 4:53:18 PM By: Harold Barban Entered By: Harold Barban on 12/30/2018 13:26:13 [name] ([id number]) -------------------------------------------------------------------------------- Wound Assessment Details Patient Name: SHAKEENA, KAFER. Date of Service: 12/30/2018 12:30 PM Medical Record Number: [id number] Patient Account Number: [id number] Date of Birth/Sex: 12/01/1950 (68 y.o. F) Treating RN: Harold Barban Primary Care Anmol Paschen: Crist Infante Other Clinician: Referring Mattye Verdone: Crist Infante Treating Faizan Geraci/Extender: Melburn Hake, HOYT Weeks in Treatment: 15 Wound Status Wound Number: 1 Primary Auto-immune Etiology: Wound Location: Right, Lateral Lower Leg Wound Open Wounding Event: Bump Status: Date Acquired: 08/01/2018 Comorbid Arrhythmia, Deep Vein Thrombosis, Weeks Of Treatment: 15 History: Hypertension, Osteoarthritis, Received Clustered Wound: No Chemotherapy Photos Wound Measurements Length: (cm) 0.5 Width: (cm) 0.3 Depth: (cm) 0.1 Area: (cm) 0.118 Volume: (cm) 0.012 % Reduction in Area: 97.3% % Reduction in Volume: 99.4% Epithelialization: Small (1-33%) Wound Description Full Thickness Without Exposed Support Foul O Classification: Structures Slough Wound Margin: Flat and Intact Exudate Small Amount: Exudate Type: Serous Exudate Color: amber dor After Cleansing: No /Fibrino Yes Wound Bed Granulation Amount: Small (1-33%) Exposed Structure Granulation Quality: Pink Fascia Exposed: No Necrotic Amount:  Large (67-100%) Fat Layer (Subcutaneous Tissue) Exposed: Yes Necrotic Quality: Adherent Slough Tendon Exposed: No Muscle Exposed: No Joint Exposed: No Bone Exposed: No SHEBA, WHALING ([id number]) Periwound Skin Texture Texture Color No Abnormalities Noted: No No Abnormalities Noted: Yes Callus: No Erythema Location: Circumferential Crepitus: No Temperature / Pain Excoriation: No Temperature: No Abnormality Induration: Yes Tenderness on Palpation: Yes Rash: No Scarring: Yes Moisture No Abnormalities Noted: No Dry / Scaly: Yes Maceration: No Treatment Notes Wound #1 (Right, Lateral Lower Leg) Notes Silver collagen to just the 2 spots open within the wound bed, apply xeroform on top of wound, ABD, conform, Tubigrip Electronic Signature(s) Signed: 12/30/2018 4:53:18 PM By: Harold Barban Entered By: Harold Barban on 12/30/2018 13:12:02 [name] ([id number]) -------------------------------------------------------------------------------- Vitals Details Patient Name: [name] Date of Service: 12/30/2018 12:30 PM Medical Record Number: [id number] Patient Account Number: [id number] Date of Birth/Sex: Jul 25, 1951 (68 y.o. F) Treating RN: Harold Barban Primary Care Jia Dottavio: Crist Infante Other Clinician: Referring Yonael Tulloch: Crist Infante Treating Liat Mayol/Extender: Melburn Hake, HOYT Weeks in Treatment: 15 Vital Signs Time Taken: 12:52 Temperature (F): 98.3 Height (in): 63 Pulse (bpm): 86 Weight (lbs): 174 Respiratory Rate (breaths/min): 16 Body Mass Index (BMI): 30.8 Blood Pressure (mmHg): 108/55 Reference Range: 80 - 120 mg / dl Electronic Signature(s) Signed: 12/30/2018 2:45:31 PM By: Lorine Bears RCP, RRT, CHT Entered By: Becky Sax, Amado Nash on 12/30/2018 12:56:07

## 2018-12-30 NOTE — Progress Notes (Signed)
Joyce, Kaufman ([id number]) Visit Report for 12/30/2018 Chief Complaint Document Details Patient Name: [name], [name]. Date of Service: 12/30/2018 12:30 PM Medical Record Number: [id number] Patient Account Number: [id number] Date of Birth/Sex: March 12, 1951 (68 y.o. F) Treating RN: Harold Barban Primary Care Provider: Crist Infante Other Clinician: Referring Provider: Crist Infante Treating Provider/Extender: Melburn Hake, Kaliopi Blyden Weeks in Treatment: 15 Information Obtained from: Patient Chief Complaint Right LE Ulcer Electronic Signature(s) Signed: 12/30/2018 5:03:42 PM By: Worthy Keeler PA-C Entered By: Worthy Keeler on 12/30/2018 13:00:15 [name] ([id number]) -------------------------------------------------------------------------------- Debridement Details Patient Name: [name] Date of Service: 12/30/2018 12:30 PM Medical Record Number: [id number] Patient Account Number: [id number] Date of Birth/Sex: 1951-02-19 (68 y.o. F) Treating RN: Harold Barban Primary Care Provider: Crist Infante Other Clinician: Referring Provider: Crist Infante Treating Provider/Extender: Melburn Hake, Manie Bealer Weeks in Treatment: 15 Debridement Performed for Wound #1 Right,Lateral Lower Leg Assessment: Performed By: Physician STONE III, Faryal Marxen E., PA-C Debridement Type: Debridement Level of Consciousness (Pre- Awake and Alert procedure): Pre-procedure Verification/Time Yes - 13:26 Out Taken: Start Time: 13:26 Pain Control: Lidocaine Total Area Debrided (L x W): 0.5 (cm) x 0.3 (cm) = 0.15 (cm) Tissue and other material Viable, Non-Viable, Slough, Subcutaneous, Skin: Epidermis, Fibrin/Exudate, Slough debrided: Level: Skin/Subcutaneous Tissue Debridement Description: Excisional Instrument: Curette Bleeding: Minimum Hemostasis Achieved: Pressure End Time: 13:31 Procedural Pain: 0 Post Procedural Pain: 0 Response to Treatment: Procedure was tolerated well Level of Consciousness Awake and  Alert (Post-procedure): Post Debridement Measurements of Total Wound Length: (cm) 1.5 Width: (cm) 0.3 Depth: (cm) 0.2 Volume: (cm) 0.071 Character of Wound/Ulcer Post Debridement: Improved Post Procedure Diagnosis Same as Pre-procedure Electronic Signature(s) Signed: 12/30/2018 4:53:18 PM By: Harold Barban Signed: 12/30/2018 5:03:42 PM By: Worthy Keeler PA-C Entered By: Harold Barban on 12/30/2018 13:31:11 [name] ([id number]) -------------------------------------------------------------------------------- HPI Details Patient Name: [name] Date of Service: 12/30/2018 12:30 PM Medical Record Number: [id number] Patient Account Number: [id number] Date of Birth/Sex: Dec 11, 1950 (68 y.o. 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 She states that she often has areas like this that will come up but normally not to the severity. She's typically able to take care of these herself. However she's been having a lot of trouble since this past summer in particular. 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 She was most recently treated with this by her primary care provider. Fortunately that seems to have cleared up any infection that she may have had. Nonetheless other pertinent medical history includes the chronic long-term use of Eliquis secondary to atrial fibrillation. Her ABI appear to be okay today. She did have a DVT 13 years ago and this lag but has not had anything recently. The Eliquis seems to be beneficial in this regard. Otherwise there's no evidence of active  infection at this time. No fevers, chills, nausea, or vomiting noted at this time. 09/19/18 on evaluation today patient presents for follow-up concerning her ongoing lower extremity wound. This is her second visit here in our clinic. She does have slough covering the surface of the wound today. Fortunately there is no signs of infection at this time. Overall I have been  pleased with the interval change although she still has a lot of Slough I do feel like the Iodoflex was of benefit for her. We did have to change her wrap once in the weeks since I last saw her fortunately after that changed nothing seem to smell or drain through. 09/27/18 on evaluation today patient appears to be doing better in regard to her right lateral lower extremity ulcer. She's been tolerating the dressing changes without complication. Fortunately there is no sign of infection at this time. With that being said we have had to change the wrap at least once in the interim between when we see her and when she comes back in for evaluation due to the amount of drainage an odor. With that being said she is getting very frustrated with the length of time is taken get this to heal. Fortunately there is no evidence of anything worsening and a lot of the slough is improving she has good granulation seems to be peeking out from underneath. She is still having discomfort unfortunately. 10/03/18 on evaluation today patient's wound actually appears to be doing better from the standpoint of feeling in. With that being said she actually does have some slough covering the surface of the wound unfortunately. This is going to require sharp debridement prior to application of EpiFix. 10/15/18 on evaluation today patient actually appears to be doing much better in regard to her wound. I do believe that the EpiFix has been of benefit for her which is excellent news. Overall I think that this is something I would definitely recommend continuing I've  seen definite progress. 10/22/18 on evaluation today patient actually appears to be doing very well in regard to her leg ulcer. This is shown signs of excellent improvement measurement wise she is continuing to week by week make great progress in my pinion. Overall very pleased in this regard. Fortunately there's no signs of infection at this time. I do believe that the EpiFix has been beneficial for her. 10/29/18 on evaluation today patient actually appears to be doing very well today. The EpiFix does seem to be benefiting her as far as the overall improvement we have seen is concerned. Fortunately there's no signs of infection at this time and again her pain is much less than what it used to be. Overall I feel like she's making excellent progress. 11/12/18 on evaluation today patient presents after unfortunately having had a stroke since the last time I seen her until today. She tells me that she's actually doing very well in this regard she did have some rehab in the hospital and then subsequently was discharged to home. She is home at this point. Nonetheless she has physical therapy a potential occupational therapy is gonna be coming out to see her. Nonetheless she tells me that she is not having any evidence at this time of active infection which is good news she's not having any evidence of pain either in regard to the right lateral lower extremity. Overall I feel like she has done well as far as I'm concerned in the EpiFix which did stay in place for longer than the week obviously I think still have good effect based on what I'm seeing at this point the wound is significantly smaller. DOLOREZ, JEFFREY ([id number]) 11/18/18 on evaluation today patient comes in one day early for evaluation here in our clinic due to issues that she's having with her right lower extremity she states she was having a lot of discomfort and could  not wait any longer to have this checked. Fortunately she did come in  because she's actually having what appears to be significant infection which needs to be addressed I did not want this to continue without being addressed. No fevers, chills, nausea, or vomiting noted at this time. 11/26/18 on evaluation today patient will significantly better compared to last week regarding her lower extremity ulcer. Last week she had an infection we finally did get the results of the culture back which did show methicillin-resistant Staphylococcus aureus as the positive organism. Subsequently the antibiotic that she's been on has completely resolved the infection as best I can tell she seems to be doing absolutely excellent today. I'm very pleased in this regard. She's having little to no drainage and no pain. Last week and this was very painful. 12/03/18 on evaluation today patient appears to be doing rather well in regard to her lateral lower extremity ulcer on the right. She's been tolerating the dressing changes without complication. Fortunately the wound is shown signs of good improvement this week. The infection is in your much better control and I think were making excellent progress. 12/09/18 on evaluation today patient appears to be doing decently well in regard to her lower Trinity ulcer this point. Fortunately there's no signs of active infection at this time. Overall I'm very pleased with how things seem to be progressing. With that being said although I did want to see her today to ensure there's no signs of active infection I did want to also see about leaving the EpiFix in place for one additional week before complete removal. 12/16/18 on evaluation today patient actually appears to be doing very well in regard to her wound. She has some dressing material stuck to the surface the wound but other than this seems to be making good progress. There's no signs of active infection at this time which is good news. 12/23/18 on evaluation today patient appears to be doing rather  well in regard to her lower extremity ulcer. The biggest thing I'm seeing is that the Prisma still continues to get somewhat dry and stuck into the wound bed which I think is somewhat slowing things down as far as healing is concerned. Fortunately there's no signs of active infection at this time which is good news. 12/30/18 on evaluation today patient actually appears to be making good progress with regard to her lower extremity ulcer. There's no signs of active infection at this time which is good news. No fevers, chills, nausea, or vomiting noted at this time. Electronic Signature(s) Signed: 12/30/2018 5:03:42 PM By: Worthy Keeler PA-C Entered By: Worthy Keeler on 12/30/2018 13:39:54 [name] ([id number]) -------------------------------------------------------------------------------- Physical Exam Details Patient Name: ALIYAH, ABEYTA. Date of Service: 12/30/2018 12:30 PM Medical Record Number: [id number] Patient Account Number: [id number] Date of Birth/Sex: [birth year] (68 y.o. F) Treating RN: Harold Barban Primary Care Provider: Crist Infante Other Clinician: Referring Provider: Crist Infante Treating Provider/Extender: STONE III, Oralee Rapaport Weeks in Treatment: 29 Constitutional Well-nourished and well-hydrated in no acute distress. Respiratory normal breathing without difficulty. Psychiatric this patient is able to make decisions and demonstrates good insight into disease process. Alert and Oriented x 3. pleasant and cooperative. Notes Upon inspection patient's wound bed actually appears to be doing well it's not quite as tightly adhered and dry as last time as far as the dressing is concerned. Still there was a surface film that it have to be debride away. The good news is post debridement wound bed  appears to be doing significantly better every pleased in this regard. Overall I think she's making great progress. Electronic Signature(s) Signed: 12/30/2018 5:03:42 PM By: Worthy Keeler PA-C Entered By: Worthy Keeler on 12/30/2018 13:43:04 [name] ([id number]) -------------------------------------------------------------------------------- Physician Orders Details Patient Name: KIMBLE, DELAURENTIS. Date of Service: 12/30/2018 12:30 PM Medical Record Number: [id number] Patient Account Number: [id number] Date of Birth/Sex: 1951/03/17 (68 y.o. 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 o May shower with protection. - Please do not get your wrap wet Anesthetic (add to Medication List) Wound #1 Right,Lateral Lower Leg o Topical Lidocaine 4% cream applied to wound bed prior to debridement (In Clinic Only). o Benzocaine Topical Anesthetic Spray applied to wound bed prior to debridement (In Clinic Only). Primary Wound Dressing Wound #1 Right,Lateral Lower Leg o Silver Collagen - Apply to the two small open areas of wound o Xeroform - Apply on top of wound Secondary Dressing Wound #1 Right,Lateral Lower Leg o Conform/Kerlix - Rolled conform to secure Dressing Change Frequency Wound #1 Right,Lateral Lower Leg o Other: - Change every three days Follow-up Appointments Wound #1 Right,Lateral Lower Leg o Return Appointment in 1 week. Edema Control Wound #1 Right,Lateral Lower Leg o Elevate legs to the level of the heart and pump ankles as often as possible o Other: [address] ALEXCIS, BICKING ([id number]) Electronic Signature(s) Signed: 12/30/2018 4:53:18 PM By: Harold Barban Signed: 12/30/2018 5:03:42 PM By: Worthy Keeler PA-C Entered By: Harold Barban on 12/30/2018 13:32:24 [name] ([id number]) -------------------------------------------------------------------------------- Problem List Details Patient Name: AMARIONNA, ARCA. Date of Service: 12/30/2018 12:30 PM Medical Record Number: [id number] Patient Account Number: [id number] Date of Birth/Sex: September 11, 1950 (68 y.o. 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 09/12/2018 No Yes L97.812 Non-pressure chronic ulcer of other part of right lower leg 09/12/2018 No Yes with fat layer exposed I48.0 Paroxysmal atrial fibrillation 09/12/2018 No Yes I10 Essential (primary) hypertension 09/12/2018 No Yes Z79.01 Long term (current) use of anticoagulants 09/12/2018 No Yes Inactive Problems Resolved Problems Electronic Signature(s) Signed: 12/30/2018 5:03:42 PM By: Worthy Keeler PA-C Entered By: Worthy Keeler on 12/30/2018 13:00:11 [name] ([id number]) -------------------------------------------------------------------------------- Progress Note Details Patient Name: [name] Date of Service: 12/30/2018 12:30 PM Medical Record Number: [id number] Patient Account Number: [id number] Date of Birth/Sex: [birth year] (68 y.o. 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 She states that she often has areas like this that will come up but normally not to the severity. She's typically  able to take care of these herself. However she's been having a lot of trouble since this past summer in particular. 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 She was most recently treated with this by her primary care provider. Fortunately that seems to have cleared up any infection that she may have had. Nonetheless other pertinent medical history includes the chronic long-term use of Eliquis secondary to atrial fibrillation. Her ABI appear to be okay today. She did have a DVT 13 years ago and this lag but has not had anything recently. The Eliquis seems to be beneficial in this regard. Otherwise there's no evidence of active infection at this time. No fevers, chills, nausea, or vomiting noted at this time. 09/19/18 on evaluation today patient presents for follow-up concerning her ongoing lower extremity wound. This is her second visit here in our clinic. She does have slough covering the surface of the wound today. Fortunately there is no signs of infection at this time. Overall I have been pleased with the interval change although she still has a lot of Slough I do feel like the Iodoflex was of benefit for her. We did have to change her wrap once in the weeks since I last saw her fortunately after that changed nothing seem to smell or drain through. 09/27/18 on evaluation today patient appears to be doing better in regard to her right lateral lower extremity ulcer. She's been tolerating the dressing changes without complication. Fortunately there is no sign of infection at this time. With that being said we have had to change the wrap at least once  in the interim between when we see her and when she comes back in for evaluation due to the amount of drainage an odor. With that being said she is getting very frustrated with the length of time is taken get this to heal. Fortunately there is no evidence of anything worsening and a lot of the slough is improving she has good granulation seems to be peeking out from underneath. She is still having discomfort unfortunately. 10/03/18 on evaluation today patient's wound actually appears to be doing better from the standpoint of feeling in. With that being said she actually does have some slough covering the surface of the wound unfortunately. This is going to require sharp debridement prior to application of EpiFix. 10/15/18 on evaluation today patient actually appears to be doing much better in regard to her wound. I do believe that the EpiFix has been of benefit for her which is excellent news. Overall I think that this is something I would definitely recommend continuing I've seen definite progress. 10/22/18 on evaluation today patient actually appears to be doing very well in regard to her leg ulcer. This is shown signs of excellent improvement measurement wise she is continuing to week by week make great progress in my pinion. Overall very pleased in this regard. Fortunately there's no signs of infection at this time. I do believe that the EpiFix has been beneficial for her. 10/29/18 on evaluation today patient actually appears to be doing very well today. The EpiFix does seem to be benefiting her as far as the overall improvement we have seen is concerned. Fortunately there's no signs of infection at this time and again her pain is much less than what it used to be. Overall I feel like she's making excellent progress. 11/12/18 on evaluation today patient presents after unfortunately having had a stroke  since the last time I seen her until today. KRYSTA, BLOOMFIELD ([id number]) She tells me that she's  actually doing very well in this regard she did have some rehab in the hospital and then subsequently was discharged to home. She is home at this point. Nonetheless she has physical therapy a potential occupational therapy is gonna be coming out to see her. Nonetheless she tells me that she is not having any evidence at this time of active infection which is good news she's not having any evidence of pain either in regard to the right lateral lower extremity. Overall I feel like she has done well as far as I'm concerned in the EpiFix which did stay in place for longer than the week obviously I think still have good effect based on what I'm seeing at this point the wound is significantly smaller. 11/18/18 on evaluation today patient comes in one day early for evaluation here in our clinic due to issues that she's having with her right lower extremity she states she was having a lot of discomfort and could not wait any longer to have this checked. Fortunately she did come in because she's actually having what appears to be significant infection which needs to be addressed I did not want this to continue without being addressed. No fevers, chills, nausea, or vomiting noted at this time. 11/26/18 on evaluation today patient will significantly better compared to last week regarding her lower extremity ulcer. Last week she had an infection we finally did get the results of the culture back which did show methicillin-resistant Staphylococcus aureus as the positive organism. Subsequently the antibiotic that she's been on has completely resolved the infection as best I can tell she seems to be doing absolutely excellent today. I'm very pleased in this regard. She's having little to no drainage and no pain. Last week and this was very painful. 12/03/18 on evaluation today patient appears to be doing rather well in regard to her lateral lower extremity ulcer on the right. She's been tolerating the dressing changes  without complication. Fortunately the wound is shown signs of good improvement this week. The infection is in your much better control and I think were making excellent progress. 12/09/18 on evaluation today patient appears to be doing decently well in regard to her lower Trinity ulcer this point. Fortunately there's no signs of active infection at this time. Overall I'm very pleased with how things seem to be progressing. With that being said although I did want to see her today to ensure there's no signs of active infection I did want to also see about leaving the EpiFix in place for one additional week before complete removal. 12/16/18 on evaluation today patient actually appears to be doing very well in regard to her wound. She has some dressing material stuck to the surface the wound but other than this seems to be making good progress. There's no signs of active infection at this time which is good news. 12/23/18 on evaluation today patient appears to be doing rather well in regard to her lower extremity ulcer. The biggest thing I'm seeing is that the Prisma still continues to get somewhat dry and stuck into the wound bed which I think is somewhat slowing things down as far as healing is concerned. Fortunately there's no signs of active infection at this time which is good news. 12/30/18 on evaluation today patient actually appears to be making good progress with regard to her lower extremity ulcer. There's no signs  of active infection at this time which is good news. No fevers, chills, nausea, or vomiting noted at this time. Patient History Information obtained from Patient. Family History Cancer - Father, Diabetes - Maternal Grandparents, Lung Disease - Father, Stroke - Father, No family history of Heart Disease, Hereditary Spherocytosis, Hypertension, Kidney Disease, Seizures, Thyroid Problems, Tuberculosis. Social History Never smoker, Marital Status - Married, Alcohol Use - Never, Drug Use  - No History, Caffeine Use - Daily. Medical History Eyes Denies history of Cataracts, Glaucoma, Optic Neuritis Ear/Nose/Mouth/Throat Denies history of Chronic sinus problems/congestion, Middle ear problems Hematologic/Lymphatic Denies history of Anemia, Hemophilia, Human Immunodeficiency Virus, Sickle Cell Disease Respiratory Denies history of Aspiration, Asthma, Chronic Obstructive Pulmonary Disease (COPD), Pneumothorax, Sleep Apnea, Tuberculosis LAKEIDRA, RELIFORD ([id number]) Cardiovascular Patient has history of Arrhythmia - a fib, Deep Vein Thrombosis - 13 years ago, Hypertension Denies history of Angina, Congestive Heart Failure, Coronary Artery Disease, Hypotension, Myocardial Infarction, Peripheral Arterial Disease, Peripheral Venous Disease, Phlebitis, Vasculitis Gastrointestinal Denies history of Cirrhosis , Colitis, Crohn s, Hepatitis A, Hepatitis B, Hepatitis C Endocrine Denies history of Type I Diabetes, Type II Diabetes Genitourinary Denies history of End Stage Renal Disease Immunological Denies history of Lupus Erythematosus, Raynaud s, Scleroderma Integumentary (Skin) Denies history of History of Burn, History of pressure wounds Musculoskeletal Patient has history of Osteoarthritis Denies history of Gout, Rheumatoid Arthritis, Osteomyelitis Neurologic Denies history of Dementia, Neuropathy, Quadriplegia, Paraplegia, Seizure Disorder Oncologic Patient has history of Received Chemotherapy - past for Wegeners Denies history of Received Radiation Psychiatric Denies history of Anorexia/bulimia, Confinement Anxiety Medical And Surgical History Notes Immunological Wegeners Musculoskeletal OP Review of Systems (ROS) Constitutional Symptoms (General Health) Denies complaints or symptoms of Fatigue, Fever, Chills, Marked Weight Change. Respiratory Denies complaints or symptoms of Chronic or frequent coughs, Shortness of Breath. Cardiovascular Denies complaints or  symptoms of Chest pain, LE edema. Psychiatric Denies complaints or symptoms of Anxiety, Claustrophobia. Objective Constitutional Well-nourished and well-hydrated in no acute distress. Vitals Time Taken: 12:52 PM, Height: 63 in, Weight: 174 lbs, BMI: 30.8, Temperature: 98.3 F, Pulse: 86 bpm, Respiratory Rate: 16 breaths/min, Blood Pressure: 108/55 mmHg. Respiratory OLIVIANNA, HIGLEY. ([id number]) normal breathing without difficulty. Psychiatric this patient is able to make decisions and demonstrates good insight into disease process. Alert and Oriented x 3. pleasant and cooperative. General Notes: Upon inspection patient's wound bed actually appears to be doing well it's not quite as tightly adhered and dry as last time as far as the dressing is concerned. Still there was a surface film that it have to be debride away. The good news is post debridement wound bed appears to be doing significantly better every pleased in this regard. Overall I think she's making great progress. Integumentary (Hair, Skin) Wound #1 status is Open. Original cause of wound was Bump. The wound is located on the Right,Lateral Lower Leg. The wound measures 0.5cm length x 0.3cm width x 0.1cm depth; 0.118cm^2 area and 0.012cm^3 volume. There is Fat Layer (Subcutaneous Tissue) Exposed exposed. There is a small amount of serous drainage noted. The wound margin is flat and intact. There is small (1-33%) pink granulation within the wound bed. There is a large (67-100%) amount of necrotic tissue within the wound bed including Adherent Slough. The periwound skin appearance had no abnormalities noted for color. The periwound skin appearance exhibited: Induration, Scarring, Dry/Scaly. The periwound skin appearance did not exhibit: Callus, Crepitus, Excoriation, Rash, Maceration. Periwound temperature was noted as No Abnormality. The periwound has tenderness on palpation. 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 There was a Excisional Skin/Subcutaneous Tissue Debridement with a total area of 0.15 sq cm performed by STONE III, Mylasia Vorhees E., PA-C. With the following instrument(s): Curette to remove Viable and Non-Viable tissue/material. Material removed includes Subcutaneous Tissue, Slough, Skin: Epidermis, and Fibrin/Exudate after achieving pain control using Lidocaine. No specimens were taken. A time out was conducted at 13:26, prior to the start of the procedure. A Minimum amount of bleeding was controlled with Pressure. The procedure was tolerated well with a pain level of 0 throughout and a pain level of 0 following the procedure. Post Debridement Measurements: 1.5cm length x 0.3cm width x 0.2cm depth; 0.071cm^3 volume. Character of Wound/Ulcer Post Debridement is improved. Post procedure Diagnosis Wound #1: Same as Pre-Procedure ENIJAH, FURR ([id number]) Plan Wound Cleansing: Wound #1 Right,Lateral Lower Leg: Clean wound with Normal Saline. May shower with protection. - Please do not get your wrap wet Anesthetic (add to Medication List): Wound #1 Right,Lateral Lower Leg: Topical Lidocaine 4% cream applied to wound bed prior to debridement (In Clinic Only). Benzocaine Topical Anesthetic Spray applied to wound bed prior to debridement (In Clinic Only). Primary Wound Dressing: Wound #1 Right,Lateral Lower Leg: Silver Collagen - Apply to the two small open areas of wound Xeroform - Apply on top of wound Secondary Dressing: Wound #1 Right,Lateral Lower Leg: Conform/Kerlix - Rolled conform to secure Dressing Change Frequency: Wound #1 Right,Lateral Lower  Leg: Other: - Change every three days Follow-up Appointments: Wound #1 Right,Lateral Lower Leg: Return Appointment in 1 week. Edema Control: Wound #1 Right,Lateral Lower Leg: Elevate legs to the level of the heart and pump ankles as often as possible Other: - Tubi Grip E My suggestion is gonna be that we go ahead and continue with the above wound care measures for the next week and the patient is in agreement with that plan. We will subsequently see were things stand at follow-up. Anything changes worsens meantime shall contact the office and let me know. Please see above for specific wound care orders. We will see patient for re-evaluation in 1 week(s) here in the clinic. If anything worsens or changes patient will contact our office for additional recommendations. Electronic Signature(s) Signed: 12/30/2018 5:03:42 PM By: Worthy Keeler PA-C Entered By: Worthy Keeler on 12/30/2018 13:43:17 [name] ([id number]) -------------------------------------------------------------------------------- ROS/PFSH Details Patient Name: LUBERTHA, LEITE. Date of Service: 12/30/2018 12:30 PM Medical Record Number: [id number] Patient Account Number: [id number] Date of Birth/Sex: Mar 23, 1951 (68 y.o. F) Treating RN: Harold Barban Primary Care Provider: Crist Infante Other Clinician: Referring Provider: Crist Infante Treating Provider/Extender: Melburn Hake, Fletcher Rathbun Weeks in Treatment: 15 Information Obtained From Patient Constitutional Symptoms (General Health) Complaints and Symptoms: Negative for: Fatigue; Fever; Chills; Marked Weight Change Respiratory Complaints and Symptoms: Negative for: Chronic or frequent coughs; Shortness of Breath Medical History: Negative for: Aspiration; Asthma; Chronic Obstructive Pulmonary Disease (COPD); Pneumothorax; Sleep Apnea; Tuberculosis Cardiovascular Complaints and Symptoms: Negative for: Chest pain; LE edema Medical History: Positive for: Arrhythmia - a  fib; Deep Vein Thrombosis - 13 years ago; Hypertension Negative for: Angina; Congestive Heart Failure; Coronary Artery Disease; Hypotension; Myocardial Infarction; Peripheral Arterial Disease; Peripheral Venous Disease; Phlebitis; Vasculitis Psychiatric Complaints and Symptoms: Negative for: Anxiety;  Claustrophobia Medical History: Negative for: Anorexia/bulimia; Confinement Anxiety Eyes Medical History: Negative for: Cataracts; Glaucoma; Optic Neuritis Ear/Nose/Mouth/Throat Medical History: Negative for: Chronic sinus problems/congestion; Middle ear problems Hematologic/Lymphatic Medical History: Negative for: Anemia; Hemophilia; Human Immunodeficiency Virus; Sickle Cell Disease EULAMAE, GREENSTEIN ([id number]) Gastrointestinal Medical History: Negative for: Cirrhosis ; Colitis; Crohnos; Hepatitis A; Hepatitis B; Hepatitis C Endocrine Medical History: Negative for: Type I Diabetes; Type II Diabetes Genitourinary Medical History: Negative for: End Stage Renal Disease Immunological Medical History: Negative for: Lupus Erythematosus; Raynaudos; Scleroderma Past Medical History Notes: Wegeners Integumentary (Skin) Medical History: Negative for: History of Burn; History of pressure wounds Musculoskeletal Medical History: Positive for: Osteoarthritis Negative for: Gout; Rheumatoid Arthritis; Osteomyelitis Past Medical History Notes: OP Neurologic Medical History: Negative for: Dementia; Neuropathy; Quadriplegia; Paraplegia; Seizure Disorder Oncologic Medical History: Positive for: Received Chemotherapy - past for Wegeners Negative for: Received Radiation Immunizations Pneumococcal Vaccine: Received Pneumococcal Vaccination: Yes Implantable Devices No devices added Family and Social History Cancer: Yes - Father; Diabetes: Yes - Maternal Grandparents; Heart Disease: No; Hereditary Spherocytosis: No; Hypertension: No; Kidney Disease: No; Lung Disease: Yes - Father; Seizures:  No; Stroke: Yes - Father; Thyroid Problems: No; Tuberculosis: No; Never smoker; Marital Status - Married; Alcohol Use: Never; Drug Use: No History; Caffeine Use: Daily; Financial Concerns: No; Food, Clothing or Shelter Needs: No; Support System Lacking: No; Transportation Concerns: No JERZY, CROTTEAU ([id number]) Physician Affirmation I have reviewed and agree with the above information. Electronic Signature(s) Signed: 12/30/2018 4:53:18 PM By: Harold Barban Signed: 12/30/2018 5:03:42 PM By: Worthy Keeler PA-C Entered By: Worthy Keeler on 12/30/2018 13:42:48 [name] ([id number]) -------------------------------------------------------------------------------- SuperBill Details Patient Name: ASPYN, WARNKE. Date of Service: 12/30/2018 Medical Record Number: [id number] Patient Account Number: [id number] Date of Birth/Sex: 01-13-1951 (68 y.o. F) Treating RN: Harold Barban Primary Care Provider: Crist Infante Other Clinician: Referring Provider: Crist Infante Treating Provider/Extender: Melburn Hake, Arlesia Kiel Weeks in Treatment: 15 Diagnosis Coding ICD-10 Codes Code Description 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 with fat layer exposed I48.0 Paroxysmal atrial fibrillation I10 Essential (primary) hypertension Z79.01 Long term (current) use of anticoagulants Facility Procedures CPT4 Code Description: 85631497 11042 - DEB SUBQ TISSUE 20 SQ CM/< ICD-10 Diagnosis Description W26.378 Non-pressure chronic ulcer of other part of right lower leg wi Modifier: th fat layer expo Quantity: 1 sed Physician Procedures CPT4 Code Description: [id number] 11042 - WC PHYS SUBQ TISS 20 SQ CM ICD-10 Diagnosis Description X41.287 Non-pressure chronic ulcer of other part of right lower leg wi Modifier: th fat layer expo Quantity: 1 sed Electronic Signature(s) Signed: 12/30/2018 5:03:42 PM By: Worthy Keeler  PA-C Entered By: Worthy Keeler on 12/30/2018 13:43:26

## 2019-01-06 ENCOUNTER — Encounter: Payer: Medicare Other | Admitting: Physician Assistant

## 2019-01-06 ENCOUNTER — Other Ambulatory Visit: Payer: Self-pay

## 2019-01-06 DIAGNOSIS — M313 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: Secondary | ICD-10-CM | POA: Diagnosis not present

## 2019-01-06 DIAGNOSIS — Z7901 Long term (current) use of anticoagulants: Secondary | ICD-10-CM | POA: Diagnosis not present

## 2019-01-06 DIAGNOSIS — L97812 Non-pressure chronic ulcer of other part of right lower leg with fat layer exposed: Secondary | ICD-10-CM | POA: Diagnosis not present

## 2019-01-06 DIAGNOSIS — Z86718 Personal history of other venous thrombosis and embolism: Secondary | ICD-10-CM | POA: Diagnosis not present

## 2019-01-06 DIAGNOSIS — I10 Essential (primary) hypertension: Secondary | ICD-10-CM | POA: Diagnosis not present

## 2019-01-06 DIAGNOSIS — I480 Paroxysmal atrial fibrillation: Secondary | ICD-10-CM | POA: Diagnosis not present

## 2019-01-07 DIAGNOSIS — M313 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: Secondary | ICD-10-CM | POA: Diagnosis not present

## 2019-01-07 NOTE — Progress Notes (Signed)
Joyce, Kaufman ([id number]) Visit Report for 01/06/2019 Chief Complaint Document Details Patient Name: [name], [name]. Date of Service: 01/06/2019 12:30 PM Medical Record Number: [id number] Patient Account Number: [id number] Date of Birth/Sex: 1951/02/23 (68 y.o. F) Treating RN: Harold Barban Primary Care Provider: Crist Infante Other Clinician: Referring Provider: Crist Infante Treating Provider/Extender: Melburn Hake, Joyell Emami Weeks in Treatment: 16 Information Obtained from: Patient Chief Complaint Right LE Ulcer Electronic Signature(s) Signed: 01/07/2019 1:42:22 PM By: Worthy Keeler PA-C Entered By: Worthy Keeler on 01/06/2019 12:43:18 [name] ([id number]) -------------------------------------------------------------------------------- HPI Details Patient Name: [name] Date of Service: 01/06/2019 12:30 PM Medical Record Number: [id number] Patient Account Number: [id number] Date of Birth/Sex: 1951/05/06 (68 y.o. 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 She states that she often has areas like this that will come up but normally not to the severity. She's typically able to take care of these herself. However she's been having a lot of trouble since this past summer in particular. 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 She was most recently treated with this by her primary care provider. Fortunately that seems to have cleared up any infection that she may have had. Nonetheless other pertinent medical history includes the chronic long-term use of Eliquis secondary  to atrial fibrillation. Her ABI appear to be okay today. She did have a DVT 13 years ago and this lag but has not had anything recently. The Eliquis seems to be beneficial in this regard. Otherwise there's no evidence of active infection at this time. No fevers, chills, nausea, or vomiting noted at this time. 09/19/18 on evaluation today patient presents for follow-up concerning her ongoing lower extremity wound. This is her second visit here in our clinic. She does have slough covering the surface of the wound today. Fortunately there is no signs of infection at this time. Overall I have been pleased with the interval change although she still has a lot of Slough I do feel like the Iodoflex was of benefit for her. We did have to change her wrap once in the weeks since I last saw her fortunately after that changed nothing seem to smell or drain through. 09/27/18 on evaluation today patient appears to be doing better in regard to her right lateral lower extremity ulcer. She's been tolerating the dressing changes without complication. Fortunately there is no sign of infection at this time. With that being said we have had to change the wrap at least once in the interim between when we see her and when she comes back in for evaluation due to the amount of drainage an odor. With that being said she is getting very frustrated with the length of time is taken get this to heal. Fortunately there is no evidence of anything worsening and a lot of the slough is improving she has good granulation seems to be peeking out from underneath. She is still having discomfort unfortunately. 10/03/18 on evaluation today patient's wound actually appears to be doing better from the standpoint of feeling in. With that being said she actually does have some slough covering the surface of the wound unfortunately. This is going to  require sharp debridement prior to application of EpiFix. 10/15/18 on evaluation today patient  actually appears to be doing much better in regard to her wound. I do believe that the EpiFix has been of benefit for her which is excellent news. Overall I think that this is something I would definitely recommend continuing I've seen definite progress. 10/22/18 on evaluation today patient actually appears to be doing very well in regard to her leg ulcer. This is shown signs of excellent improvement measurement wise she is continuing to week by week make great progress in my pinion. Overall very pleased in this regard. Fortunately there's no signs of infection at this time. I do believe that the EpiFix has been beneficial for her. 10/29/18 on evaluation today patient actually appears to be doing very well today. The EpiFix does seem to be benefiting her as far as the overall improvement we have seen is concerned. Fortunately there's no signs of infection at this time and again her pain is much less than what it used to be. Overall I feel like she's making excellent progress. 11/12/18 on evaluation today patient presents after unfortunately having had a stroke since the last time I seen her until today. She tells me that she's actually doing very well in this regard she did have some rehab in the hospital and then subsequently was discharged to home. She is home at this point. Nonetheless she has physical therapy a potential occupational therapy is gonna be coming out to see her. Nonetheless she tells me that she is not having any evidence at this time of active infection which is good news she's not having any evidence of pain either in regard to the right lateral lower extremity. Overall I feel like she has done well as far as I'm concerned in the EpiFix which did stay in place for longer than the week obviously I think still have good effect based on what I'm seeing at this point the wound is significantly smaller. 11/18/18 on evaluation today patient comes in one day early for evaluation here in our  clinic due to issues that she's having with her right lower extremity she states she was having a lot of discomfort and could not wait any longer to have this checked. Fortunately she did come in because she's actually having what appears to be significant infection which needs to be addressed I did not want this to continue without being addressed. No fevers, chills, nausea, or vomiting noted at this time. 11/26/18 on evaluation today patient will significantly better compared to last week regarding her lower extremity ulcer. Last week she had an infection we finally did get the results of the culture back which did show methicillin-resistant KAWANDA, DRUMHELLER. ([id number]) Staphylococcus aureus as the positive organism. Subsequently the antibiotic that she's been on has completely resolved the infection as best I can tell she seems to be doing absolutely excellent today. I'm very pleased in this regard. She's having little to no drainage and no pain. Last week and this was very painful. 12/03/18 on evaluation today patient appears to be doing rather well in regard to her lateral lower extremity ulcer on the right. She's been tolerating the dressing changes without complication. Fortunately the wound is shown signs of good improvement this week. The infection is in your much better control and I think were making excellent progress. 12/09/18 on evaluation today patient appears to be doing decently well in regard to her lower Trinity ulcer this point. Fortunately there's no  signs of active infection at this time. Overall I'm very pleased with how things seem to be progressing. With that being said although I did want to see her today to ensure there's no signs of active infection I did want to also see about leaving the EpiFix in place for one additional week before complete removal. 12/16/18 on evaluation today patient actually appears to be doing very well in regard to her wound. She has some  dressing material stuck to the surface the wound but other than this seems to be making good progress. There's no signs of active infection at this time which is good news. 12/23/18 on evaluation today patient appears to be doing rather well in regard to her lower extremity ulcer. The biggest thing I'm seeing is that the Prisma still continues to get somewhat dry and stuck into the wound bed which I think is somewhat slowing things down as far as healing is concerned. Fortunately there's no signs of active infection at this time which is good news. 12/30/18 on evaluation today patient actually appears to be making good progress with regard to her lower extremity ulcer. There's no signs of active infection at this time which is good news. No fevers, chills, nausea, or vomiting noted at this time. 01/06/19 on evaluation today patient actually appears to be doing very well in regard to her lower Trinity ulcer. She's been tolerating the dressing changes without complication. Fortunately there's no signs of active infection at this time which is also good news. Overall very pleased with how things seem to be progressing. Electronic Signature(s) Signed: 01/07/2019 1:42:22 PM By: Worthy Keeler PA-C Entered By: Worthy Keeler on 01/06/2019 13:14:02 [name] ([id number]) -------------------------------------------------------------------------------- Physical Exam Details Patient Name: CAERA, ENWRIGHT. Date of Service: 01/06/2019 12:30 PM Medical Record Number: [id number] Patient Account Number: [id number] Date of Birth/Sex: 1951/06/14 (68 y.o. F) Treating RN: Harold Barban Primary Care Provider: Crist Infante Other Clinician: Referring Provider: Crist Infante Treating Provider/Extender: STONE III, Erva Koke Weeks in Treatment: 64 Constitutional Well-nourished and well-hydrated in no acute distress. Respiratory normal breathing without difficulty. clear to auscultation  bilaterally. Cardiovascular regular rate and rhythm with normal S1, S2. Psychiatric this patient is able to make decisions and demonstrates good insight into disease process. Alert and Oriented x 3. pleasant and cooperative. Notes Patient's wound bed currently showed signs of good granulation at this time the does not appear to be any evidence of infection and again the patient seems to be overall doing very well. There was a little bit of the dressing material stuck to the surface of the wound this was gently clean the way she has two small openings remaining but in general has made excellent progress in no sharp debridement was necessary today. Electronic Signature(s) Signed: 01/07/2019 1:42:22 PM By: Worthy Keeler PA-C Entered By: Worthy Keeler on 01/06/2019 13:14:42 [name] ([id number]) -------------------------------------------------------------------------------- Physician Orders Details Patient Name: TALAJAH, SLIMP. Date of Service: 01/06/2019 12:30 PM Medical Record Number: [id number] Patient Account Number: [id number] Date of Birth/Sex: Dec 09, 1950 (68 y.o. 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 o May shower with protection. - Please do not get your wrap wet Anesthetic (add to Medication List) Wound #1 Right,Lateral Lower Leg o Topical Lidocaine 4% cream applied to wound bed prior to  debridement (In Clinic Only). o Benzocaine Topical Anesthetic Spray applied to wound bed prior to debridement (In Clinic Only). Primary Wound Dressing Wound #1 Right,Lateral Lower Leg o Silver Collagen - Apply to the two small open areas of wound o Xeroform - Apply on top of wound Secondary Dressing Wound #1 Right,Lateral Lower Leg o Conform/Kerlix - Rolled conform to secure Dressing Change Frequency Wound #1 Right,Lateral Lower Leg o Other: - Change every three days Follow-up Appointments Wound #1 Right,Lateral Lower Leg o Return Appointment in 2 weeks. Edema Control Wound #1 Right,Lateral Lower Leg o Elevate legs to the level of the heart and pump ankles as often as possible o Other: - Tubi Grip E Electronic Signature(s) Signed: 01/06/2019 4:38:46 PM By: Harold Barban Signed: 01/07/2019 1:42:22 PM By: Worthy Keeler PA-C Entered By: Harold Barban on 01/06/2019 12:50:50 [name] ([id number]) -------------------------------------------------------------------------------- Problem List Details Patient Name: JALINA, BLOWERS. Date of Service: 01/06/2019 12:30 PM Medical Record Number: [id number] Patient Account Number: [id number] Date of Birth/Sex: [birth year] (68 y.o. 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 09/12/2018 No Yes L97.812 Non-pressure chronic ulcer of other part of right lower leg 09/12/2018 No Yes with fat layer exposed I48.0 Paroxysmal atrial fibrillation 09/12/2018 No Yes I10 Essential (primary) hypertension 09/12/2018 No Yes Z79.01 Long term (current) use of anticoagulants 09/12/2018 No Yes Inactive Problems Resolved Problems Electronic  Signature(s) Signed: 01/07/2019 1:42:22 PM By: Worthy Keeler PA-C Entered By: Worthy Keeler on 01/06/2019 12:43:12 [name] ([id number]) -------------------------------------------------------------------------------- Progress Note Details Patient Name: [name] Date of Service: 01/06/2019 12:30 PM Medical Record Number: [id number] Patient Account Number: [id number] Date of Birth/Sex: [birth year] (68 y.o. 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 She states that she often has areas like this that will come up but normally not to the severity. She's typically able to take care of these herself. However she's been having a lot of trouble since this past summer in particular. 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 She was most recently treated with this by her primary care provider. Fortunately that seems to have cleared up any infection that she may have had. Nonetheless other pertinent medical history includes the chronic long-term use of Eliquis secondary to atrial fibrillation. Her ABI appear to be okay today. She did have a DVT 13 years ago and this lag but has not had anything recently. The Eliquis seems to be beneficial in this regard. Otherwise there's no evidence of active infection at this time. No fevers, chills, nausea, or vomiting noted at this time. 09/19/18 on evaluation today patient presents for follow-up concerning her ongoing lower extremity wound.  This is her second visit here in our  clinic. She does have slough covering the surface of the wound today. Fortunately there is no signs of infection at this time. Overall I have been pleased with the interval change although she still has a lot of Slough I do feel like the Iodoflex was of benefit for her. We did have to change her wrap once in the weeks since I last saw her fortunately after that changed nothing seem to smell or drain through. 09/27/18 on evaluation today patient appears to be doing better in regard to her right lateral lower extremity ulcer. She's been tolerating the dressing changes without complication. Fortunately there is no sign of infection at this time. With that being said we have had to change the wrap at least once in the interim between when we see her and when she comes back in for evaluation due to the amount of drainage an odor. With that being said she is getting very frustrated with the length of time is taken get this to heal. Fortunately there is no evidence of anything worsening and a lot of the slough is improving she has good granulation seems to be peeking out from underneath. She is still having discomfort unfortunately. 10/03/18 on evaluation today patient's wound actually appears to be doing better from the standpoint of feeling in. With that being said she actually does have some slough covering the surface of the wound unfortunately. This is going to require sharp debridement prior to application of EpiFix. 10/15/18 on evaluation today patient actually appears to be doing much better in regard to her wound. I do believe that the EpiFix has been of benefit for her which is excellent news. Overall I think that this is something I would definitely recommend continuing I've seen definite progress. 10/22/18 on evaluation today patient actually appears to be doing very well in regard to her leg ulcer. This is shown signs of excellent improvement measurement wise she is continuing to week by week make  great progress in my pinion. Overall very pleased in this regard. Fortunately there's no signs of infection at this time. I do believe that the EpiFix has been beneficial for her. 10/29/18 on evaluation today patient actually appears to be doing very well today. The EpiFix does seem to be benefiting her as far as the overall improvement we have seen is concerned. Fortunately there's no signs of infection at this time and again her pain is much less than what it used to be. Overall I feel like she's making excellent progress. 11/12/18 on evaluation today patient presents after unfortunately having had a stroke since the last time I seen her until today. She tells me that she's actually doing very well in this regard she did have some rehab in the hospital and then subsequently was discharged to home. She is home at this point. Nonetheless she has physical therapy a potential occupational therapy is gonna be coming out to see her. Nonetheless she tells me that she is not having any evidence at this time of active infection which is good news she's not having any evidence of pain either in regard to the right lateral lower extremity. Overall I feel like she has done well as far as I'm concerned in the EpiFix which did stay in place for longer than the week obviously I think still have good effect based on what I'm seeing at this point the wound is significantly smaller. 11/18/18 on evaluation today patient comes in  one day early for evaluation here in our clinic due to issues that she's having with her right lower extremity she states she was having a lot of discomfort and could not wait any longer to have this LAVAEH, BAU. ([id number]) checked. Fortunately she did come in because she's actually having what appears to be significant infection which needs to be addressed I did not want this to continue without being addressed. No fevers, chills, nausea, or vomiting noted at this time. 11/26/18 on  evaluation today patient will significantly better compared to last week regarding her lower extremity ulcer. Last week she had an infection we finally did get the results of the culture back which did show methicillin-resistant Staphylococcus aureus as the positive organism. Subsequently the antibiotic that she's been on has completely resolved the infection as best I can tell she seems to be doing absolutely excellent today. I'm very pleased in this regard. She's having little to no drainage and no pain. Last week and this was very painful. 12/03/18 on evaluation today patient appears to be doing rather well in regard to her lateral lower extremity ulcer on the right. She's been tolerating the dressing changes without complication. Fortunately the wound is shown signs of good improvement this week. The infection is in your much better control and I think were making excellent progress. 12/09/18 on evaluation today patient appears to be doing decently well in regard to her lower Trinity ulcer this point. Fortunately there's no signs of active infection at this time. Overall I'm very pleased with how things seem to be progressing. With that being said although I did want to see her today to ensure there's no signs of active infection I did want to also see about leaving the EpiFix in place for one additional week before complete removal. 12/16/18 on evaluation today patient actually appears to be doing very well in regard to her wound. She has some dressing material stuck to the surface the wound but other than this seems to be making good progress. There's no signs of active infection at this time which is good news. 12/23/18 on evaluation today patient appears to be doing rather well in regard to her lower extremity ulcer. The biggest thing I'm seeing is that the Prisma still continues to get somewhat dry and stuck into the wound bed which I think is somewhat slowing things down as far as healing is  concerned. Fortunately there's no signs of active infection at this time which is good news. 12/30/18 on evaluation today patient actually appears to be making good progress with regard to her lower extremity ulcer. There's no signs of active infection at this time which is good news. No fevers, chills, nausea, or vomiting noted at this time. 01/06/19 on evaluation today patient actually appears to be doing very well in regard to her lower Trinity ulcer. She's been tolerating the dressing changes without complication. Fortunately there's no signs of active infection at this time which is also good news. Overall very pleased with how things seem to be progressing. Patient History Information obtained from Patient. Family History Cancer - Father, Diabetes - Maternal Grandparents, Lung Disease - Father, Stroke - Father, No family history of Heart Disease, Hereditary Spherocytosis, Hypertension, Kidney Disease, Seizures, Thyroid Problems, Tuberculosis. Social History Never smoker, Marital Status - Married, Alcohol Use - Never, Drug Use - No History, Caffeine Use - Daily. Medical History Eyes Denies history of Cataracts, Glaucoma, Optic Neuritis Ear/Nose/Mouth/Throat Denies history of Chronic sinus problems/congestion, Middle ear  problems Hematologic/Lymphatic Denies history of Anemia, Hemophilia, Human Immunodeficiency Virus, Sickle Cell Disease Respiratory Denies history of Aspiration, Asthma, Chronic Obstructive Pulmonary Disease (COPD), Pneumothorax, Sleep Apnea, Tuberculosis Cardiovascular Patient has history of Arrhythmia - a fib, Deep Vein Thrombosis - 13 years ago, Hypertension Denies history of Angina, Congestive Heart Failure, Coronary Artery Disease, Hypotension, Myocardial Infarction, Peripheral Arterial Disease, Peripheral Venous Disease, Phlebitis, Vasculitis Gastrointestinal Denies history of Cirrhosis , Colitis, Crohn s, Hepatitis A, Hepatitis B, Hepatitis C Endocrine Denies  history of Type I Diabetes, Type II Diabetes Genitourinary Denies history of End Stage Renal Disease Immunological Denies history of Lupus Erythematosus, Raynaud s, Scleroderma Integumentary (Skin) RUBYE, STROHMEYER ([id number]) Denies history of History of Burn, History of pressure wounds Musculoskeletal Patient has history of Osteoarthritis Denies history of Gout, Rheumatoid Arthritis, Osteomyelitis Neurologic Denies history of Dementia, Neuropathy, Quadriplegia, Paraplegia, Seizure Disorder Oncologic Patient has history of Received Chemotherapy - past for Wegeners Denies history of Received Radiation Psychiatric Denies history of Anorexia/bulimia, Confinement Anxiety Medical And Surgical History Notes Immunological Wegeners Musculoskeletal OP Review of Systems (ROS) Constitutional Symptoms (General Health) Denies complaints or symptoms of Fatigue, Fever, Chills, Marked Weight Change. Respiratory Denies complaints or symptoms of Chronic or frequent coughs, Shortness of Breath. Cardiovascular Denies complaints or symptoms of Chest pain, LE edema. Psychiatric Denies complaints or symptoms of Anxiety, Claustrophobia. Objective Constitutional Well-nourished and well-hydrated in no acute distress. Vitals Time Taken: 12:36 PM, Height: 63 in, Weight: 174 lbs, BMI: 30.8, Temperature: 98.4 F, Pulse: 53 bpm, Respiratory Rate: 16 breaths/min, Blood Pressure: 126/44 mmHg. Respiratory normal breathing without difficulty. clear to auscultation bilaterally. Cardiovascular regular rate and rhythm with normal S1, S2. Psychiatric this patient is able to make decisions and demonstrates good insight into disease process. Alert and Oriented x 3. pleasant and cooperative. General Notes: Patient's wound bed currently showed signs of good granulation at this time the does not appear to be any evidence of infection and again the patient seems to be overall doing very well. There was a little bit  of the dressing material stuck to the surface of the wound this was gently clean the way she has two small openings remaining but in general has made excellent progress in no sharp debridement was necessary today. Integumentary (Hair, Skin) Wound #1 status is Open. Original cause of wound was Bump. The wound is located on the Right,Lateral Lower Leg. The wound measures 0.5cm length x 0.3cm width x 0.1cm depth; 0.118cm^2 area and 0.012cm^3 volume. There is Fat Layer (Subcutaneous Tissue) Exposed exposed. There is no tunneling or undermining noted. There is a small amount of serous drainage noted. The wound margin is flat and intact. There is small (1-33%) pink granulation within the wound bed. There is a large (67-100%) amount of necrotic tissue within the wound bed including Adherent Slough. 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 May shower with protection. - Please do not get your wrap wet Anesthetic (add to Medication List): Wound #1 Right,Lateral Lower Leg: Topical Lidocaine 4% cream applied to wound bed prior to debridement (In Clinic Only). Benzocaine Topical Anesthetic Spray applied to wound bed prior to debridement (In Clinic Only). Primary Wound Dressing: Wound #1 Right,Lateral Lower Leg: Silver Collagen - Apply to the two small open  areas of wound Xeroform - Apply on top of wound Secondary Dressing: Wound #1 Right,Lateral Lower Leg: Conform/Kerlix - Rolled conform to secure Dressing Change Frequency: Wound #1 Right,Lateral Lower Leg: Other: - Change every three days Follow-up Appointments: Wound #1 Right,Lateral Lower  Leg: Return Appointment in 2 weeks. Edema Control: Wound #1 Right,Lateral Lower Leg: Elevate legs to the level of the heart and pump ankles as often as possible Other: - Tubi Grip E My suggestion at this point is gonna be that we continue with the above wound care measures for the next week. Patient is in agreement with plan. We will subsequently see were things stand at follow-up. If anything changes worsens meantime she will contact the office and let me know. Please see above for specific wound care orders. We will see patient for re-evaluation in 1 week(s) here in the clinic. If anything worsens or changes patient will contact our office for additional recommendations. Electronic Signature(s) Signed: 01/07/2019 1:42:22 PM By: Worthy Keeler PA-C Entered By: Worthy Keeler on 01/06/2019 13:15:01 [name] ([id number]) -------------------------------------------------------------------------------- ROS/PFSH Details Patient Name: TAKESHA, STEGER. Date of Service: 01/06/2019 12:30 PM Medical Record Number: [id number] Patient Account Number: [id number] Date of Birth/Sex: 1951/06/07 (68 y.o. F) Treating RN: Harold Barban Primary Care Provider: Crist Infante Other Clinician: Referring Provider: Crist Infante Treating Provider/Extender: Melburn Hake, Mikinzie Maciejewski Weeks in Treatment: 16 Information Obtained From Patient Constitutional Symptoms (General Health) Complaints and Symptoms: Negative for: Fatigue; Fever; Chills; Marked Weight Change Respiratory Complaints and Symptoms: Negative for: Chronic or frequent coughs; Shortness of Breath Medical History: Negative for: Aspiration; Asthma; Chronic Obstructive Pulmonary Disease (COPD); Pneumothorax; Sleep Apnea; Tuberculosis Cardiovascular Complaints and Symptoms: Negative for: Chest pain; LE edema Medical History: Positive for: Arrhythmia - a fib; Deep Vein Thrombosis - 13 years ago; Hypertension Negative for: Angina; Congestive Heart  Failure; Coronary Artery Disease; Hypotension; Myocardial Infarction; Peripheral Arterial Disease; Peripheral Venous Disease; Phlebitis; Vasculitis Psychiatric Complaints and Symptoms: Negative for: Anxiety; Claustrophobia Medical History: Negative for: Anorexia/bulimia; Confinement Anxiety Eyes Medical History: Negative for: Cataracts; Glaucoma; Optic Neuritis Ear/Nose/Mouth/Throat Medical History: Negative for: Chronic sinus problems/congestion; Middle ear problems Hematologic/Lymphatic Medical History: Negative for: Anemia; Hemophilia; Human Immunodeficiency Virus; Sickle Cell Disease Gastrointestinal Medical History: Negative for: Cirrhosis ; Colitis; Crohnos; Hepatitis A; Hepatitis B; Hepatitis C Endocrine Medical History: Negative for: Type I Diabetes; Type II Diabetes CASIE, STURGEON ([id number]) Genitourinary Medical History: Negative for: End Stage Renal Disease Immunological Medical History: Negative for: Lupus Erythematosus; Raynaudos; Scleroderma Past Medical History Notes: Wegeners Integumentary (Skin) Medical History: Negative for: History of Burn; History of pressure wounds Musculoskeletal Medical History: Positive for: Osteoarthritis Negative for: Gout; Rheumatoid Arthritis; Osteomyelitis Past Medical History Notes: OP Neurologic Medical History: Negative for: Dementia; Neuropathy; Quadriplegia; Paraplegia; Seizure Disorder Oncologic Medical History: Positive for: Received Chemotherapy - past for Wegeners Negative for: Received Radiation Immunizations Pneumococcal Vaccine: Received Pneumococcal Vaccination: Yes Implantable Devices No devices added Family and Social History Cancer: Yes - Father; Diabetes: Yes - Maternal Grandparents; Heart Disease: No; Hereditary Spherocytosis: No; Hypertension: No; Kidney Disease: No; Lung Disease: Yes - Father; Seizures: No; Stroke: Yes - Father; Thyroid Problems: No; Tuberculosis: No; Never smoker; Marital  Status - Married; Alcohol Use: Never; Drug Use: No History; Caffeine Use: Daily; Financial Concerns: No; Food, Clothing or Shelter Needs: No; Support System Lacking: No; Transportation Concerns: No Physician Affirmation I have reviewed and agree with the above information. Electronic Signature(s) Signed: 01/06/2019 4:38:46 PM By: Harold Barban Signed: 01/07/2019 1:42:22 PM By: Melburn Hake,  Mavric Cortright PA-C Entered By: Worthy Keeler on 01/06/2019 13:14:28 [name] ([id number]) -------------------------------------------------------------------------------- SuperBill Details Patient Name: [name] Date of Service: 01/06/2019 Medical Record Number: [id number] Patient Account Number: [id number] Date of Birth/Sex: 24-Feb-1951 (68 y.o. 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 with fat layer exposed I48.0 Paroxysmal atrial fibrillation I10 Essential (primary) hypertension Z79.01 Long term (current) use of anticoagulants Facility Procedures CPT4 Code: [id number] Description: 93716 - WOUND CARE VISIT-LEV 2 EST PT Modifier: Quantity: 1 Physician Procedures CPT4 Code Description: 9678938 99214 - WC PHYS LEVEL 4 - EST PT ICD-10 Diagnosis Description 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

## 2019-01-07 NOTE — Progress Notes (Signed)
[name] ([id number]) Visit Report for 01/06/2019 Arrival Information Details Patient Name: [name], [name]. Date of Service: 01/06/2019 12:30 PM Medical Record Number: [id number] Patient Account Number: [id number] Date of Birth/Sex: 03/01/1951 (68 y.o. F) Treating RN: Harold Barban Primary Care Jadalyn Oliveri: Crist Infante Other Clinician: Referring Karrie Fluellen: Crist Infante Treating Tatem Fesler/Extender: Melburn Hake, HOYT Weeks in Treatment: 16 Visit Information History Since Last Visit Added or deleted any medications: No Patient Arrived: Ambulatory Any new allergies or adverse reactions: No Arrival Time: 12:35 Had a fall or experienced change in No Accompanied By: self activities of daily living that may affect Transfer Assistance: None risk of falls: Patient Identification Verified: Yes Signs or symptoms of abuse/neglect since last visito No Secondary Verification Process Yes Implantable device outside of the clinic excluding No Completed: cellular tissue based products placed in the center Patient Has Alerts: Yes since last visit: Patient Alerts: Patient on Blood Has Dressing in Place as Prescribed: Yes Thinner Pain Present Now: No Eliquis Electronic Signature(s) Signed: 01/07/2019 1:59:56 PM By: Lorine Bears RCP, RRT, CHT Entered By: Lorine Bears on 01/06/2019 12:36:30 [name] ([id number]) -------------------------------------------------------------------------------- Clinic Level of Care Assessment Details Patient Name: [name]. Date of Service: 01/06/2019 12:30 PM Medical Record Number: [id number] Patient Account Number: [id number] Date of Birth/Sex: [birth year] (68 y.o. F) Treating RN: Harold Barban Primary Care Wyona Neils: Crist Infante Other Clinician: Referring Neeta Storey: Crist Infante Treating Darwin Rothlisberger/Extender: Melburn Hake, HOYT Weeks in Treatment: 16 Clinic Level of Care Assessment Items TOOL 4 Quantity Score []  - Use when  only an EandM is performed on FOLLOW-UP visit 0 ASSESSMENTS - Nursing Assessment / Reassessment X - Reassessment of Co-morbidities (includes updates in patient status) 1 10 X- 1 5 Reassessment of Adherence to Treatment Plan ASSESSMENTS - Wound and Skin Assessment / Reassessment X - Simple Wound Assessment / Reassessment - one wound 1 5 []  - 0 Complex Wound Assessment / Reassessment - multiple wounds []  - 0 Dermatologic / Skin Assessment (not related to wound area) ASSESSMENTS - Focused Assessment []  - Circumferential Edema Measurements - multi extremities 0 []  - 0 Nutritional Assessment / Counseling / Intervention []  - 0 Lower Extremity Assessment (monofilament, tuning fork, pulses) []  - 0 Peripheral Arterial Disease Assessment (using hand held doppler) ASSESSMENTS - Ostomy and/or Continence Assessment and Care []  - Incontinence Assessment and Management 0 []  - 0 Ostomy Care Assessment and Management (repouching, etc.) PROCESS - Coordination of Care X - Simple Patient / Family Education for ongoing care 1 15 []  - 0 Complex (extensive) Patient / Family Education for ongoing care []  - 0 Staff obtains Programmer, systems, Records, Test Results / Process Orders []  - 0 Staff telephones HHA, Nursing Homes / Clarify orders / etc []  - 0 Routine Transfer to another Facility (non-emergent condition) []  - 0 Routine Hospital Admission (non-emergent condition) []  - 0 New Admissions / Biomedical engineer / Ordering NPWT, Apligraf, etc. []  - 0 Emergency Hospital Admission (emergent condition) X- 1 10 Simple Discharge Coordination []  - 0 Complex (extensive) Discharge Coordination PROCESS - Special Needs []  - Pediatric / Minor Patient Management 0 []  - 0 Isolation Patient Management AQUEELAH, COTRELL ([id number]) []  - 0 Hearing / Language / Visual special needs []  - 0 Assessment of Community assistance (transportation, D/C planning, etc.) []  - 0 Additional assistance / Altered  mentation []  - 0 Support Surface(s) Assessment (bed, cushion, seat, etc.) INTERVENTIONS - Wound Cleansing / Measurement X - Simple Wound Cleansing - one wound 1 5 []  - 0  Complex Wound Cleansing - multiple wounds X- 1 5 Wound Imaging (photographs - any number of wounds) []  - 0 Wound Tracing (instead of photographs) X- 1 5 Simple Wound Measurement - one wound []  - 0 Complex Wound Measurement - multiple wounds INTERVENTIONS - Wound Dressings X - Small Wound Dressing one or multiple wounds 1 10 []  - 0 Medium Wound Dressing one or multiple wounds []  - 0 Large Wound Dressing one or multiple wounds []  - 0 Application of Medications - topical []  - 0 Application of Medications - injection INTERVENTIONS - Miscellaneous []  - External ear exam 0 []  - 0 Specimen Collection (cultures, biopsies, blood, body fluids, etc.) []  - 0 Specimen(s) / Culture(s) sent or taken to Lab for analysis []  - 0 Patient Transfer (multiple staff / Civil Service fast streamer / Similar devices) []  - 0 Simple Staple / Suture removal (25 or less) []  - 0 Complex Staple / Suture removal (26 or more) []  - 0 Hypo / Hyperglycemic Management (close monitor of Blood Glucose) []  - 0 Ankle / Brachial Index (ABI) - do not check if billed separately X- 1 5 Vital Signs Has the patient been seen at the hospital within the last three years: Yes Total Score: 75 Level Of Care: New/Established - Level 2 Electronic Signature(s) Signed: 01/06/2019 4:38:46 PM By: Harold Barban Entered By: Harold Barban on 01/06/2019 12:47:03 [name] ([id number]) -------------------------------------------------------------------------------- Encounter Discharge Information Details Patient Name: [name]. Date of Service: 01/06/2019 12:30 PM Medical Record Number: [id number] Patient Account Number: [id number] Date of Birth/Sex: [birth year] (68 y.o. F) Treating RN: Harold Barban Primary Care Abdulkareem Badolato: Crist Infante Other  Clinician: Referring Emalia Witkop: Crist Infante Treating Natiya Seelinger/Extender: Melburn Hake, HOYT Weeks in Treatment: 16 Encounter Discharge Information Items Discharge Condition: Stable Ambulatory Status: Ambulatory Discharge Destination: Home Transportation: Private Auto Accompanied By: self Schedule Follow-up Appointment: Yes Clinical Summary of Care: Electronic Signature(s) Signed: 01/06/2019 4:38:46 PM By: Harold Barban Entered By: Harold Barban on 01/06/2019 12:59:07 [name] ([id number]) -------------------------------------------------------------------------------- Lower Extremity Assessment Details Patient Name: LEIGHANNA, KIRN. Date of Service: 01/06/2019 12:30 PM Medical Record Number: [id number] Patient Account Number: [id number] Date of Birth/Sex: August 21, 1951 (68 y.o. F) Treating RN: Cornell Barman Primary Care [name]: Crist Infante Other Clinician: Referring Francis Yardley: Crist Infante Treating Effie Janoski/Extender: Melburn Hake, HOYT Weeks in Treatment: 16 Edema Assessment Assessed: [Left: No] [Right: No] Edema: [Left: N] [Right: o] Vascular Assessment Pulses: Dorsalis Pedis Palpable: [Right:Yes] Electronic Signature(s) Signed: 01/06/2019 4:57:57 PM By: Gretta Cool, BSN, RN, CWS, [name] RN, BSN Entered By: Gretta Cool, BSN, RN, CWS, [name] on 01/06/2019 12:40:31 [name] ([id number]) -------------------------------------------------------------------------------- Multi Wound Chart Details Patient Name: LORELLE, MACALUSO. Date of Service: 01/06/2019 12:30 PM Medical Record Number: [id number] Patient Account Number: [id number] Date of Birth/Sex: 19-Dec-1950 (68 y.o. F) Treating RN: Harold Barban Primary Care Luke Falero: Crist Infante Other Clinician: Referring Leighla Chestnutt: Crist Infante Treating Hildegarde Dunaway/Extender: Melburn Hake, HOYT Weeks in Treatment: 16 Vital Signs Height(in): 63 Pulse(bpm): 53 Weight(lbs): 174 Blood Pressure(mmHg): 126/44 Body Mass Index(BMI): 31 Temperature(F):  98.4 Respiratory Rate 16 (breaths/min): Photos: [N/A:N/A] Wound Location: Right Lower Leg - Lateral N/A N/A Wounding Event: Bump N/A N/A Primary Etiology: Auto-immune N/A N/A Comorbid History: Arrhythmia, Deep Vein N/A N/A Thrombosis, Hypertension, Osteoarthritis, Received Chemotherapy Date Acquired: 08/01/2018 N/A N/A Weeks of Treatment: 16 N/A N/A Wound Status: Open N/A N/A Measurements L x W x D 0.5x0.3x0.1 N/A N/A (cm) Area (cm) : 0.118 N/A N/A Volume (cm) : 0.012 N/A N/A % Reduction in Area: 97.30% N/A N/A % Reduction  in Volume: 99.40% N/A N/A Classification: Full Thickness Without N/A N/A Exposed Support Structures Exudate Amount: Small N/A N/A Exudate Type: Serous N/A N/A Exudate Color: amber N/A N/A Wound Margin: Flat and Intact N/A N/A Granulation Amount: Small (1-33%) N/A N/A Granulation Quality: Pink N/A N/A Necrotic Amount: Large (67-100%) N/A N/A Exposed Structures: Fat Layer (Subcutaneous N/A N/A Tissue) Exposed: Yes Fascia: No Tendon: No Muscle: No Joint: No Bone: No Epithelialization: Small (1-33%) N/A N/A Treatment Notes LUNNA, VOGELGESANG ([id number]) Electronic Signature(s) Signed: 01/06/2019 4:38:46 PM By: Harold Barban Entered By: Harold Barban on 01/06/2019 12:44:44 [name] ([id number]) -------------------------------------------------------------------------------- Multi-Disciplinary Care Plan Details Patient Name: ZAMERIA, VOGL. Date of Service: 01/06/2019 12:30 PM Medical Record Number: [id number] Patient Account Number: [id number] Date of Birth/Sex: 1951-06-03 (68 y.o. F) Treating RN: Harold Barban Primary Care Lan Entsminger: Crist Infante Other Clinician: Referring Lester Crickenberger: Crist Infante Treating Atilano Covelli/Extender: Melburn Hake, HOYT Weeks in Treatment: 16 Active Inactive Necrotic Tissue Nursing Diagnoses: Impaired tissue integrity related to necrotic/devitalized tissue Knowledge deficit related to management of  necrotic/devitalized tissue Goals: Necrotic/devitalized tissue will be minimized in the wound bed Date Initiated: 09/12/2018 Target Resolution Date: 12/21/2018 Goal Status: Active Interventions: Assess patient pain level pre-, during and post procedure and prior to discharge Provide education on necrotic tissue and debridement process Treatment Activities: Apply topical anesthetic as ordered : 09/12/2018 Notes: Orientation to the Wound Care Program Nursing Diagnoses: Knowledge deficit related to the wound healing center program Goals: Patient/caregiver will verbalize understanding of the Rafael Gonzalez Date Initiated: 09/12/2018 Target Resolution Date: 12/21/2018 Goal Status: Active Interventions: Provide education on orientation to the wound center Notes: Pain, Acute or Chronic Nursing Diagnoses: Pain Management - Non-cyclic Acute (Procedural) Goals: Patient will verbalize adequate pain control and receive pain control interventions during procedures as needed Date Initiated: 09/12/2018 Target Resolution Date: 12/21/2018 Goal Status: Active Interventions: Assess comfort goal upon admission Encourage patient to take pain medications as prescribed Treatment Activities: MADIGAN, ROSENSTEEL ([id number]) Administer pain control measures as ordered : 09/12/2018 Notes: Wound/Skin Impairment Nursing Diagnoses: Impaired tissue integrity Goals: Ulcer/skin breakdown will have a volume reduction of 30% by week 4 Date Initiated: 09/12/2018 Target Resolution Date: 12/21/2018 Goal Status: Active Interventions: Assess patient/caregiver ability to obtain necessary supplies Assess patient/caregiver ability to perform ulcer/skin care regimen upon admission and as needed Assess ulceration(s) every visit Treatment Activities: Referred to DME Delcie Ruppert for dressing supplies : 09/12/2018 Skin care regimen initiated : 09/12/2018 Notes: Electronic Signature(s) Signed: 01/06/2019 4:38:46 PM By:  Harold Barban Entered By: Harold Barban on 01/06/2019 12:44:31 [name] ([id number]) -------------------------------------------------------------------------------- Pain Assessment Details Patient Name: LIRIO, BACH. Date of Service: 01/06/2019 12:30 PM Medical Record Number: [id number] Patient Account Number: [id number] Date of Birth/Sex: 15-Jul-1951 (68 y.o. F) Treating RN: Harold Barban Primary Care Kima Malenfant: Crist Infante Other Clinician: Referring Siarah Deleo: Crist Infante Treating Taelor Moncada/Extender: Melburn Hake, HOYT Weeks in Treatment: 16 Active Problems Location of Pain Severity and Description of Pain Patient Has Paino No Site Locations Pain Management and Medication Current Pain Management: Electronic Signature(s) Signed: 01/06/2019 4:38:46 PM By: Harold Barban Signed: 01/07/2019 1:59:56 PM By: Lorine Bears RCP, RRT, CHT Entered By: Lorine Bears on 01/06/2019 12:36:39 [name] ([id number]) -------------------------------------------------------------------------------- Patient/Caregiver Education Details Patient Name: DEBROAH, SHUTTLEWORTH. Date of Service: 01/06/2019 12:30 PM Medical Record Number: [id number] Patient Account Number: [id number] Date of Birth/Gender: Aug 04, 1951 (68 y.o. F) Treating RN: Harold Barban Primary Care Physician: Crist Infante Other Clinician: Referring Physician: Crist Infante Treating Physician/Extender: Melburn Hake,  HOYT Weeks in Treatment: 16 Education Assessment Education Provided To: Patient Education Topics Provided Wound/Skin Impairment: Handouts: Caring for Your Ulcer Methods: Demonstration, Explain/Verbal Responses: State content correctly Electronic Signature(s) Signed: 01/06/2019 4:38:46 PM By: Harold Barban Entered By: Harold Barban on 01/06/2019 12:45:19 [name] ([id number]) -------------------------------------------------------------------------------- Wound Assessment  Details Patient Name: KALENE, CUTLER. Date of Service: 01/06/2019 12:30 PM Medical Record Number: [id number] Patient Account Number: [id number] Date of Birth/Sex: 27-Dec-1950 (68 y.o. F) Treating RN: Cornell Barman Primary Care Tmya Wigington: Crist Infante Other Clinician: Referring Joy Haegele: Crist Infante Treating Erubiel Manasco/Extender: Melburn Hake, HOYT Weeks in Treatment: 16 Wound Status Wound Number: 1 Primary Auto-immune Etiology: Wound Location: Right Lower Leg - Lateral Wound Open Wounding Event: Bump Status: Date Acquired: 08/01/2018 Comorbid Arrhythmia, Deep Vein Thrombosis, Weeks Of Treatment: 16 History: Hypertension, Osteoarthritis, Received Clustered Wound: No Chemotherapy Photos Wound Measurements Length: (cm) 0.5 Width: (cm) 0.3 Depth: (cm) 0.1 Area: (cm) 0.118 Volume: (cm) 0.012 % Reduction in Area: 97.3% % Reduction in Volume: 99.4% Epithelialization: Small (1-33%) Tunneling: No Undermining: No Wound Description Full Thickness Without Exposed Support Classification: Structures Wound Margin: Flat and Intact Exudate Small Amount: Exudate Type: Serous Exudate Color: amber Foul Odor After Cleansing: No Slough/Fibrino Yes Wound Bed Granulation Amount: Small (1-33%) Exposed Structure Granulation Quality: Pink Fascia Exposed: No Necrotic Amount: Large (67-100%) Fat Layer (Subcutaneous Tissue) Exposed: Yes Necrotic Quality: Adherent Slough Tendon Exposed: No Muscle Exposed: No Joint Exposed: No Bone Exposed: No Treatment Notes Wound #1 (Right, Lateral Lower Leg) Notes Silver collagen to just the 2 spots open within the wound bed, apply xeroform on top of wound, ABD, conform, Tubigrip Electronic Signature(s) [name], [name] ([id number]) Signed: 01/06/2019 4:57:57 PM By: Gretta Cool, BSN, RN, CWS, [name] RN, BSN Entered By: Gretta Cool, BSN, RN, CWS, [name] on 01/06/2019 12:39:23 [name]  ([id number]) -------------------------------------------------------------------------------- [place] Details Patient Name: JALYRIC, KAESTNER. Date of Service: 01/06/2019 12:30 PM Medical Record Number: [id number] Patient Account Number: [id number] Date of Birth/Sex: December 16, 1950 (68 y.o. F) Treating RN: Harold Barban Primary Care Baine Decesare: Crist Infante Other Clinician: Referring Tee Richeson: Crist Infante Treating Dublin Grayer/Extender: Melburn Hake, HOYT Weeks in Treatment: 16 Vital Signs Time Taken: 12:36 Temperature (F): 98.4 Height (in): 63 Pulse (bpm): 53 Weight (lbs): 174 Respiratory Rate (breaths/min): 16 Body Mass Index (BMI): 30.8 Blood Pressure (mmHg): 126/44 Reference Range: 80 - 120 mg / dl Electronic Signature(s) Signed: 01/07/2019 1:59:56 PM By: Lorine Bears RCP, RRT, CHT Entered By: Lorine Bears on 01/06/2019 12:37:15

## 2019-01-14 DIAGNOSIS — M810 Age-related osteoporosis without current pathological fracture: Secondary | ICD-10-CM | POA: Diagnosis not present

## 2019-01-14 DIAGNOSIS — E7849 Other hyperlipidemia: Secondary | ICD-10-CM | POA: Diagnosis not present

## 2019-01-14 DIAGNOSIS — I10 Essential (primary) hypertension: Secondary | ICD-10-CM | POA: Diagnosis not present

## 2019-01-16 ENCOUNTER — Encounter: Payer: Medicare Other | Attending: Physical Medicine & Rehabilitation | Admitting: Physical Medicine & Rehabilitation

## 2019-01-16 ENCOUNTER — Encounter: Payer: Self-pay | Admitting: Physical Medicine & Rehabilitation

## 2019-01-16 ENCOUNTER — Other Ambulatory Visit: Payer: Self-pay

## 2019-01-16 ENCOUNTER — Telehealth: Payer: Self-pay

## 2019-01-16 VITALS — BP 118/70 | HR 84 | Ht 63.0 in | Wt 172.0 lb

## 2019-01-16 DIAGNOSIS — R269 Unspecified abnormalities of gait and mobility: Secondary | ICD-10-CM | POA: Diagnosis not present

## 2019-01-16 DIAGNOSIS — I6381 Other cerebral infarction due to occlusion or stenosis of small artery: Secondary | ICD-10-CM | POA: Diagnosis not present

## 2019-01-16 DIAGNOSIS — I69398 Other sequelae of cerebral infarction: Secondary | ICD-10-CM | POA: Insufficient documentation

## 2019-01-16 NOTE — Progress Notes (Signed)
Subjective:  Consent for [facility] visit , audio working fine but pt's video not functioning   Patient ID: [name], female    DOB: Mar 25, 1951, 68 y.o.   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 Received inpatient rehabilitation services 2012 debility related to Wegener's granulomatosis. Per chart review lives with spouse independent and driving prior to admission. Presented 11/03/2018 with right-sided weakness facial droop and slurred speech of acute onset. Cranial CT scan suspicious for left MCA density in the sylvian fissure. Patient did not receive TPA. 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 No evidence associated hemorrhage. MRI negative for large vessel occlusion. Echocardiogram with ejection fraction of 60% and normal systolic function. Neurology follow-up patient remained on eliquis as well as the addition of aspirin. Tolerating a regular diet. Bilateral lower extremity Dopplers negative. Carotid Dopplers negative. Therapy evaluations completed and patient was admitted for a compress of rehabilitation program.  Admit date:11/05/2018 Discharge date:11/08/2018 HPI Flare of Wegener's , mainly sinus received 1/2 doses of Rituxan Has not mowed lawn since her flare Has been reading out loud  Has appt with Neuro   From stroke standpoint doing better  From a balance standpoint difficulty with tandem gait  Pain Inventory Average Pain 0 Pain Right Now 0 My pain is na  In the  last 24 hours, has pain interfered with the following? General activity 0 Relation with others 0 Enjoyment of life 0 What TIME of day is your pain at its worst? na Sleep (in general) Good  Pain is worse with: na Pain improves with: na Relief from Meds: na  Mobility use a cane ability to climb steps?  yes do you drive?  no  Function disabled: date disabled na I need assistance with the following:  shopping  Neuro/Psych No problems in this area  Prior Studies Any changes since last visit?  no  Physicians involved in your care [facility]   Family History  Problem Relation Age of Onset  . Hypertension Mother   . Neuropathy Mother   . Varicose Veins Mother   . Stroke Father   . Lung cancer Father   . Arthritis Father   . Cancer Father   . Deep vein thrombosis Father   . Hypertension Father   . Varicose Veins Father   . Diabetes Maternal Grandfather   . Diabetes Maternal Uncle   . Diabetes Maternal Aunt   . Hypertension Sister   . Colon cancer Neg Hx    Social History   Socioeconomic History  . Marital status: Married    Spouse name: Not on file  . Number of children: N  . Years of education: Not on file  . Highest education level: Not on file  Occupational History  . 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 Financial resource strain: Not on file  . Food insecurity:    Worry: Not on file    Inability: Not on file  . Transportation needs:    Medical:  Not on file    Non-medical: Not on file  Tobacco Use  . Smoking status: Never Smoker  . Smokeless tobacco: Never Used  Substance and Sexual Activity  . Alcohol use: No    Comment: rare  . Drug use: No  . Sexual activity: Yes    Partners: Male    Birth control/protection: Post-menopausal  Lifestyle  . Physical activity:    Days per week: Not on file    Minutes per session: Not on file  . Stress: Not on file   Relationships  . Social connections:    Talks on phone: Not on file    Gets together: Not on file    Attends religious service: Not on file    Active member of club or organization: Not on file    Attends meetings of clubs or organizations: Not on file    Relationship status: Not on file  Other Topics Concern  . Not on file  Social History Narrative  . Not on file   Past Surgical History:  Procedure Laterality Date  . back injection     steroid injection x2  . BRONCHOSCOPY  april 2012  . iron infusion  11/14   seeing hematologist at [facility]  . lung biospy  may 2012  . PARTIAL HIP ARTHROPLASTY Left 03/2014   Past Medical History:  Diagnosis Date  . Allergic rhinitis   . Anemia    recurrent iron defic.  Marland Kitchen Cervical polyp 03/2003  . DDD (degenerative disc disease)   . Depression    Dr. Toy Care (situational, divorce, loss of parents)  . Diverticulosis   . DVT (deep venous thrombosis) (HCC)    both legs  . Esophageal stricture   . GERD (gastroesophageal reflux disease)   . Hiatal hernia   . Hyperlipidemia   . Hypertension   . Hypertension   . Neuropathy, peripheral   . OSA (obstructive sleep apnea)   . Osteoarthritis   . Pulmonary embolism ([place])    left lung  . Wegner's disease (congenital syphilitic osteochondritis) 2012   DR. John Dye at [facility]   BP 118/70 Comment: pt reported virtual visit  Pulse 84 Comment: pt reported virtual visit  Ht 5\' 3"  (1.6 m) Comment: pt reported virtual visit  Wt 172 lb (78 kg) Comment: pt reported virtual visit  LMP 12/20/2002   BMI 30.47 kg/m   Opioid Risk Score:   Fall Risk Score:  `1  Depression screen PHQ 2/9  Depression screen [facility] 2/9 01/16/2019 12/10/2018  Decreased Interest 0 0  Down, Depressed, Hopeless 0 0  PHQ - 2 Score 0 0    Review of Systems  Constitutional: Negative.   HENT: Positive for postnasal drip, rhinorrhea, sinus pressure and sneezing.   Eyes: Negative.   Respiratory: Negative.   Cardiovascular: Negative.    Gastrointestinal: Negative.   Endocrine: Negative.   Genitourinary: Negative.   Musculoskeletal: Negative.   Skin: Negative.   Allergic/Immunologic: Negative.   Neurological: Negative.   Hematological: Negative.   Psychiatric/Behavioral: Negative.   All other systems reviewed and are negative.      Objective:   Physical Exam  Alert, Orientation x 3 Recall 3/3/ objects And after 2 min delay Spells WORLD fwd and backwards Serial 7s intact to 86 Speech intact, no evidence of dysarthria Denies visual problems     Assessment & Plan:  1. Left corona radiata infarct, overall excellent functional recovery.  Has mild residual balance deficits.  Still not ready to drive.  Having  some problems with calculations. Do not think patient needs speech therapy, will make referral to outpatient PT Physical medicine rehab clinic follow-up in 6 weeks

## 2019-01-16 NOTE — Telephone Encounter (Signed)
Left vm on pts cell phone that visit will be change to video due to COVID 19. I stated we can email her the link and text it to her. If she has cell phone with a camera, lap top or computer that would be sufficient. We need her verbal consent to do video and file insurance.

## 2019-01-17 DIAGNOSIS — I10 Essential (primary) hypertension: Secondary | ICD-10-CM | POA: Diagnosis not present

## 2019-01-17 DIAGNOSIS — R82998 Other abnormal findings in urine: Secondary | ICD-10-CM | POA: Diagnosis not present

## 2019-01-20 ENCOUNTER — Ambulatory Visit: Payer: Medicare Other | Admitting: Physician Assistant

## 2019-01-20 DIAGNOSIS — Z7901 Long term (current) use of anticoagulants: Secondary | ICD-10-CM | POA: Diagnosis not present

## 2019-01-20 DIAGNOSIS — M79606 Pain in leg, unspecified: Secondary | ICD-10-CM | POA: Diagnosis not present

## 2019-01-20 DIAGNOSIS — I8290 Acute embolism and thrombosis of unspecified vein: Secondary | ICD-10-CM | POA: Diagnosis not present

## 2019-01-20 DIAGNOSIS — G629 Polyneuropathy, unspecified: Secondary | ICD-10-CM | POA: Diagnosis not present

## 2019-01-20 DIAGNOSIS — R946 Abnormal results of thyroid function studies: Secondary | ICD-10-CM | POA: Diagnosis not present

## 2019-01-20 DIAGNOSIS — Z0000 Encounter for general adult medical examination without abnormal findings: Secondary | ICD-10-CM | POA: Diagnosis not present

## 2019-01-20 DIAGNOSIS — D469 Myelodysplastic syndrome, unspecified: Secondary | ICD-10-CM | POA: Diagnosis not present

## 2019-01-20 DIAGNOSIS — J329 Chronic sinusitis, unspecified: Secondary | ICD-10-CM | POA: Diagnosis not present

## 2019-01-20 DIAGNOSIS — I6350 Cerebral infarction due to unspecified occlusion or stenosis of unspecified cerebral artery: Secondary | ICD-10-CM | POA: Diagnosis not present

## 2019-01-20 DIAGNOSIS — D509 Iron deficiency anemia, unspecified: Secondary | ICD-10-CM | POA: Diagnosis not present

## 2019-01-20 DIAGNOSIS — Z1331 Encounter for screening for depression: Secondary | ICD-10-CM | POA: Diagnosis not present

## 2019-01-20 DIAGNOSIS — M313 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: Secondary | ICD-10-CM | POA: Diagnosis not present

## 2019-01-20 DIAGNOSIS — I480 Paroxysmal atrial fibrillation: Secondary | ICD-10-CM | POA: Diagnosis not present

## 2019-01-20 DIAGNOSIS — I2699 Other pulmonary embolism without acute cor pulmonale: Secondary | ICD-10-CM | POA: Diagnosis not present

## 2019-01-21 DIAGNOSIS — M313 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: Secondary | ICD-10-CM | POA: Diagnosis not present

## 2019-01-21 NOTE — Telephone Encounter (Signed)
Email link sent to pt.

## 2019-01-21 NOTE — Telephone Encounter (Signed)
I called pt that her visit tomorrow will be video due to COVID 19. She gave verbal consent to file insurance and to do video. Pt has a lap top and was explain the doxy process. She confirmed email. I stated email will be sent today but click on link 10 minutes prior to appt tomorrow. Pt verbalized understanding.

## 2019-01-22 ENCOUNTER — Ambulatory Visit (INDEPENDENT_AMBULATORY_CARE_PROVIDER_SITE_OTHER): Payer: Medicare Other | Admitting: Adult Health

## 2019-01-22 ENCOUNTER — Other Ambulatory Visit: Payer: Self-pay

## 2019-01-22 DIAGNOSIS — E782 Mixed hyperlipidemia: Secondary | ICD-10-CM | POA: Diagnosis not present

## 2019-01-22 DIAGNOSIS — I10 Essential (primary) hypertension: Secondary | ICD-10-CM

## 2019-01-22 DIAGNOSIS — M313 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: Secondary | ICD-10-CM

## 2019-01-22 DIAGNOSIS — R2689 Other abnormalities of gait and mobility: Secondary | ICD-10-CM | POA: Diagnosis not present

## 2019-01-22 DIAGNOSIS — I776 Arteritis, unspecified: Secondary | ICD-10-CM

## 2019-01-22 DIAGNOSIS — I6381 Other cerebral infarction due to occlusion or stenosis of small artery: Secondary | ICD-10-CM | POA: Diagnosis not present

## 2019-01-22 NOTE — Progress Notes (Signed)
[facility] [address] [place]. [place] [address] [phone]       VIRTUAL VISIT FOLLOW UP NOTE  [name] Date of Birth:  1951-01-22 Medical Record Number:  [id number]   Reason for Referral:  hospital stroke follow up    Virtual Visit via Video Note  I connected with [name] on 01/23/19 at  2:15 PM EDT by a video enabled telemedicine application located remotely in my own home and verified that I am speaking with the correct person using two identifiers who was located at their own home.   Visit scheduled by Joyce Look, RN. She discussed the limitations of evaluation and management by telemedicine and the availability of in person appointments. The patient expressed understanding and agreed to proceed.Please see telephone note for additional scheduling information and consent.    CHIEF COMPLAINT:  Chief Complaint  Patient presents with  . 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 History obtained from patient and chart review. Reviewed all radiology images and labs personally.  [name] is a 68 y.o. female with history of PE on Eliquis, OSA, HTN, HLD  who presented with R sided weakness.  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  Vessel imaging unremarkable.  2D echo unremarkable.  Lower extremity venous Dopplers negative.  LDL 59 A1c 5.3.  Recommended continuation of Eliquis along with additional aspirin 81 mg.  Continuation of current statin. HTN stable.  She was discharged in stable  condition with recommendations of outpatient therapy.  Residual balance deficits with underlying balance difficulties (slightly worsened from baseline difficulties). Recent flair of Wegners and her rheumatologist believes this is what could have caused her stroke. Recently received 2 chemo infusions and will have a repeat in 6 months. Completed therapy but interested in doing outpatient therapy. Dr. Letta Kaufman placed referral to neuro rehab PT. She has not been called at this time to schedule visit. She is able to walk without assistive device without any falls.  She does have underlying history of balance difficulties and only slightly worsened since stroke.  She is questioning return to driving.  She denies any dizziness/vertigo.  Eliquis and aspirin with mild bruising but no bleeding. BP typically 120s/70s. Continues on lipitor without myalgias. Recent lipid panel on Monday with satisfactory lipid panel. No concerns today. Denies new or worsening stroke/TIA symptoms.     ROS:   14 system review of systems performed and negative with exception of balance difficulties  PMH:  Past Medical History:  Diagnosis Date  . Allergic rhinitis   . Anemia    recurrent iron defic.  Marland Kitchen Cervical polyp 03/2003  . DDD (degenerative disc disease)   . Depression    Dr. Toy Kaufman (situational, divorce, loss of parents)  . Diverticulosis   . DVT (deep venous thrombosis) (HCC)    both legs  . Esophageal stricture   . GERD (gastroesophageal reflux disease)   . Hiatal hernia   . Hyperlipidemia   . Hypertension   . Hypertension   . Neuropathy, peripheral   . OSA (obstructive sleep apnea)   . Osteoarthritis   . Pulmonary embolism ([place])  left lung  . Wegner's disease (congenital syphilitic osteochondritis) 2012   DR. Ginger Kaufman at [facility]    [place]:  Past Surgical History:  Procedure Laterality Date  . back injection     steroid injection x2  . BRONCHOSCOPY  april 2012  . iron infusion  11/14   seeing  hematologist at [facility]  . lung biospy  may 2012  . PARTIAL HIP ARTHROPLASTY Left 03/2014    Social History:  Social History   Socioeconomic History  . Marital status: Married    Spouse name: Not on file  . Number of children: N  . Years of education: Not on file  . Highest education level: Not on file  Occupational History  . Occupation: RN-disabled    Employer: [employer] Nurse  Social Needs  . Financial resource strain: Not on file  . Food insecurity:    Worry: Not on file    Inability: Not on file  . Transportation needs:    Medical: Not on file    Non-medical: Not on file  Tobacco Use  . Smoking status: Never Smoker  . Smokeless tobacco: Never Used  Substance and Sexual Activity  . Alcohol use: No    Comment: rare  . Drug use: No  . Sexual activity: Yes    Partners: Male    Birth control/protection: Post-menopausal  Lifestyle  . Physical activity:    Days per week: Not on file    Minutes per session: Not on file  . Stress: Not on file  Relationships  . Social connections:    Talks on phone: Not on file    Gets together: Not on file    Attends religious service: Not on file    Active member of club or organization: Not on file    Attends meetings of clubs or organizations: Not on file    Relationship status: Not on file  . Intimate partner violence:    Fear of current or ex partner: Not on file    Emotionally abused: Not on file    Physically abused: Not on file    Forced sexual activity: Not on file  Other Topics Concern  . Not on file  Social History Narrative  . Not on file    Family History:  Family History  Problem Relation Age of Onset  . Hypertension Mother   . Neuropathy Mother   . Varicose Veins Mother   . Stroke Father   . Lung cancer Father   . Arthritis Father   . Cancer Father   . Deep vein thrombosis Father   . Hypertension Father   . Varicose Veins Father   . Diabetes Maternal Grandfather    . Diabetes Maternal Uncle   . Diabetes Maternal Aunt   . Hypertension Sister   . Colon cancer Neg Hx     Medications:   Current Outpatient Medications on File Prior to Visit  Medication Sig Dispense Refill  . acetaminophen (TYLENOL) 325 MG tablet Take 650 mg by mouth as needed for mild pain.     Marland Kitchen apixaban (ELIQUIS) 5 MG TABS tablet Take 1 tablet (5 mg total) by mouth 2 (two) times daily. 60 tablet 0  . aspirin EC 81 MG EC tablet Take 1 tablet (81 mg total) by mouth daily. 30 tablet 0  . atorvastatin (LIPITOR) 40 MG tablet Take 1 tablet (40 mg total) by mouth daily at 6 PM. 30 tablet 0  . azaTHIOprine (  IMURAN) 50 MG tablet Take 100 mg by mouth daily.    . calcium carbonate (OS-CAL) 600 MG TABS tablet Take 600 mg by mouth daily.     . Cholecalciferol (VITAMIN D PO) Take 1 tablet by mouth 2 (two) times daily.     . DULoxetine (CYMBALTA) 60 MG capsule Take 1 capsule (60 mg total) by mouth at bedtime. 30 capsule 3  . gabapentin (NEURONTIN) 600 MG tablet Take 1 tablet (600 mg total) by mouth 2 (two) times daily. 60 tablet 0  . hydrochlorothiazide (MICROZIDE) 12.5 MG capsule Take 1 capsule by mouth daily.    . hydroxyurea (HYDREA) 500 MG capsule Take 500-1,000 mg by mouth See admin instructions. 1000mg  in the morning and 500mg  at night    . ibandronate (BONIVA) 150 MG tablet Take 150 mg by mouth every 30 (thirty) days.     Marland Kitchen lisinopril (PRINIVIL,ZESTRIL) 10 MG tablet Take 1 tablet (10 mg total) by mouth daily. 30 tablet 0  . LORazepam (ATIVAN) 0.5 MG tablet Take 1 tablet (0.5 mg total) by mouth daily as needed for anxiety. 20 tablet 0  . Multiple Vitamin (MULTIVITAMIN) tablet Take 1 tablet by mouth daily.      Marland Kitchen oxyCODONE-acetaminophen (PERCOCET/ROXICET) 5-325 MG tablet Take 1 tablet by mouth every 6 (six) hours as needed. 20 tablet 0  . predniSONE (DELTASONE) 5 MG tablet Take 5 mg by mouth daily.     No current facility-administered medications on file prior to visit.     Allergies:    Allergies  Allergen Reactions  . Latex Rash    Sensitive to latex, rash Sensitive to latex, rash  . Other Itching    Adhesive tape causes rash Adhesive tape causes rash  . Pregabalin Swelling    Swelling of hands and feet  . Codeine Itching    REACTION: itching REACTION: itching     Physical Exam  Depression screen [facility] 2/9 01/16/2019  Decreased Interest 0  Down, Depressed, Hopeless 0  PHQ - 2 Score 0     General: well developed, well nourished, pleasant middle-age Caucasian female, seated, in no evident distress Head: head normocephalic and atraumatic.     Neurologic Exam Mental Status: Awake and fully alert. Oriented to place and time. Recent and remote memory intact. Attention span, concentration and fund of knowledge appropriate. Mood and affect appropriate.  Cranial Nerves: Extraocular movements full without nystagmus. Hearing intact to voice. Facial sensation intact. Face, tongue, palate moves normally and symmetrically.  Motor: No evidence of weakness per drift assessment. Sensory.: intact to light touch Coordination: Rapid alternating movements normal in all extremities. Finger-to-nose and heel-to-shin performed accurately bilaterally. Gait and Station: Arises from chair without difficulty. Stance is normal. Gait demonstrates normal stride length and balance.  Reflexes: 1+ and symmetric. Toes downgoing.    NIHSS  0 Modified Rankin  1      ASSESSMENT: Amour Trigg is a 68 y.o. 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 Vascular risk factors include PE on Eliquis, OSA, HTN, HLD and Wegener's vasculitis.     PLAN:  1. Left corona radiata/lentiform nucleus infarct: Continue aspirin 81 mg daily and Eliquis (apixaban) daily  and lipitor  for secondary stroke prevention. Maintain strict control of hypertension with blood pressure goal below 130/90,  diabetes with hemoglobin A1c goal below 6.5% and cholesterol with LDL cholesterol (bad cholesterol) goal below 70 mg/dL.  I also advised the patient to  eat a healthy diet with plenty of whole grains, cereals, fruits and vegetables, exercise regularly with at least 30 minutes of continuous activity daily and maintain ideal body weight. 2. Wegener's vasculitis: Ongoing follow-up with rheumatology for management/monitoring 3. Balance deficit: only while ambulating. Balance slightly worsened compared to baseline.  Provided patient with neuro rehab phone number to call and schedule appointment. 4. HTN: Advised to continue current treatment regimen.  Advised to continue to monitor at home along with continued follow-up with PCP for management 5. HLD: Advised to continue current treatment regimen along with continued follow-up with PCP for future prescribing and monitoring of lipid panel 6. As she only has minimal residual deficits consisting of balance difficulty while ambulating, she is cleared to return to driving but did advise to have family member ride with her for the first few times and to avoid main roads, highways and nighttime driving.    Follow up in 3 months or call earlier if needed   Greater than 50% of time during this 30 minute non-face-to-face visit was spent on counseling, explanation of diagnosis of stroke, reviewing risk factor management of Wegener's vasculitis, HTN, HLD, planning of further management along with potential future management, and discussion with patient and family answering all questions.    Venancio Poisson, AGNP-BC  [facility] [address] [place], [place] [address]  Phone [phone] Fax [phone] Note: This document was prepared with digital dictation and possible smart phrase technology. Any transcriptional errors that result from this process are unintentional.

## 2019-01-23 ENCOUNTER — Encounter: Payer: Medicare Other | Attending: Physician Assistant | Admitting: Physician Assistant

## 2019-01-23 ENCOUNTER — Encounter: Payer: Self-pay | Admitting: Adult Health

## 2019-01-23 ENCOUNTER — Other Ambulatory Visit: Payer: Self-pay

## 2019-01-23 DIAGNOSIS — M313 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: Secondary | ICD-10-CM | POA: Insufficient documentation

## 2019-01-23 DIAGNOSIS — I10 Essential (primary) hypertension: Secondary | ICD-10-CM | POA: Diagnosis not present

## 2019-01-23 DIAGNOSIS — L97819 Non-pressure chronic ulcer of other part of right lower leg with unspecified severity: Secondary | ICD-10-CM | POA: Diagnosis not present

## 2019-01-23 DIAGNOSIS — L97812 Non-pressure chronic ulcer of other part of right lower leg with fat layer exposed: Secondary | ICD-10-CM | POA: Insufficient documentation

## 2019-01-23 DIAGNOSIS — Z7901 Long term (current) use of anticoagulants: Secondary | ICD-10-CM | POA: Diagnosis not present

## 2019-01-23 DIAGNOSIS — Z86718 Personal history of other venous thrombosis and embolism: Secondary | ICD-10-CM | POA: Diagnosis not present

## 2019-01-23 DIAGNOSIS — I480 Paroxysmal atrial fibrillation: Secondary | ICD-10-CM | POA: Insufficient documentation

## 2019-01-23 DIAGNOSIS — M1990 Unspecified osteoarthritis, unspecified site: Secondary | ICD-10-CM | POA: Insufficient documentation

## 2019-01-24 NOTE — Progress Notes (Signed)
ZEBA, LUBY ([id number]) Visit Report for 01/23/2019 Arrival Information Details Patient Name: [name], [name]. Date of Service: 01/23/2019 12:45 PM Medical Record Number: [id number] Patient Account Number: [id number] Date of Birth/Sex: [birth year] (68 y.o. F) Treating RN: Harold Barban Primary Care Farrell Broerman: Crist Infante Other Clinician: Referring Lurleen Soltero: Crist Infante Treating Nishat Livingston/Extender: Melburn Hake, HOYT Weeks in Treatment: 19 Visit Information History Since Last Visit Added or deleted any medications: No Patient Arrived: Ambulatory Any new allergies or adverse reactions: No Arrival Time: 12:53 Had a fall or experienced change in No Accompanied By: self activities of daily living that may affect Transfer Assistance: None risk of falls: Patient Identification Verified: Yes Signs or symptoms of abuse/neglect since last visito No Secondary Verification Process Yes Hospitalized since last visit: No Completed: Has Dressing in Place as Prescribed: Yes Patient Has Alerts: Yes Pain Present Now: No Patient Alerts: Patient on Blood Thinner Eliquis Electronic Signature(s) Signed: 01/23/2019 4:49:02 PM By: Harold Barban Entered By: Harold Barban on 01/23/2019 12:55:41 [name] ([id number]) -------------------------------------------------------------------------------- Clinic Level of Care Assessment Details Patient Name: [name] Date of Service: 01/23/2019 12:45 PM Medical Record Number: [id number] Patient Account Number: [id number] Date of Birth/Sex: 01-17-1951 (68 y.o. F) Treating RN: Cornell Barman Primary Care Castor Gittleman: Crist Infante Other Clinician: Referring Joakim Huesman: Crist Infante Treating Bairon Klemann/Extender: Melburn Hake, HOYT Weeks in Treatment: 19 Clinic Level of Care Assessment Items TOOL 4 Quantity Score []  - Use when only an EandM is performed on FOLLOW-UP visit 0 ASSESSMENTS - Nursing Assessment / Reassessment []  - Reassessment of Co-morbidities  (includes updates in patient status) 0 X- 1 5 Reassessment of Adherence to Treatment Plan ASSESSMENTS - Wound and Skin Assessment / Reassessment X - Simple Wound Assessment / Reassessment - one wound 1 5 []  - 0 Complex Wound Assessment / Reassessment - multiple wounds []  - 0 Dermatologic / Skin Assessment (not related to wound area) ASSESSMENTS - Focused Assessment []  - Circumferential Edema Measurements - multi extremities 0 []  - 0 Nutritional Assessment / Counseling / Intervention []  - 0 Lower Extremity Assessment (monofilament, tuning fork, pulses) []  - 0 Peripheral Arterial Disease Assessment (using hand held doppler) ASSESSMENTS - Ostomy and/or Continence Assessment and Care []  - Incontinence Assessment and Management 0 []  - 0 Ostomy Care Assessment and Management (repouching, etc.) PROCESS - Coordination of Care X - Simple Patient / Family Education for ongoing care 1 15 []  - 0 Complex (extensive) Patient / Family Education for ongoing care []  - 0 Staff obtains Programmer, systems, Records, Test Results / Process Orders []  - 0 Staff telephones HHA, Nursing Homes / Clarify orders / etc []  - 0 Routine Transfer to another Facility (non-emergent condition) []  - 0 Routine Hospital Admission (non-emergent condition) []  - 0 New Admissions / Biomedical engineer / Ordering NPWT, Apligraf, etc. []  - 0 Emergency Hospital Admission (emergent condition) X- 1 10 Simple Discharge Coordination [name], [name] ([id number]) []  - 0 Complex (extensive) Discharge Coordination PROCESS - Special Needs []  - Pediatric / Minor Patient Management 0 []  - 0 Isolation Patient Management []  - 0 Hearing / Language / Visual special needs []  - 0 Assessment of Community assistance (transportation, D/C planning, etc.) []  - 0 Additional assistance / Altered mentation []  - 0 Support Surface(s) Assessment (bed, cushion, seat, etc.) INTERVENTIONS - Wound Cleansing / Measurement []  - Simple Wound  Cleansing - one wound 0 []  - 0 Complex Wound Cleansing - multiple wounds X- 1 5 Wound Imaging (photographs - any number of wounds) []  - 0 Wound  Tracing (instead of photographs) []  - 0 Simple Wound Measurement - one wound []  - 0 Complex Wound Measurement - multiple wounds INTERVENTIONS - Wound Dressings []  - Small Wound Dressing one or multiple wounds 0 []  - 0 Medium Wound Dressing one or multiple wounds []  - 0 Large Wound Dressing one or multiple wounds []  - 0 Application of Medications - topical []  - 0 Application of Medications - injection INTERVENTIONS - Miscellaneous []  - External ear exam 0 []  - 0 Specimen Collection (cultures, biopsies, blood, body fluids, etc.) []  - 0 Specimen(s) / Culture(s) sent or taken to Lab for analysis []  - 0 Patient Transfer (multiple staff / Civil Service fast streamer / Similar devices) []  - 0 Simple Staple / Suture removal (25 or less) []  - 0 Complex Staple / Suture removal (26 or more) []  - 0 Hypo / Hyperglycemic Management (close monitor of Blood Glucose) []  - 0 Ankle / Brachial Index (ABI) - do not check if billed separately X- 1 5 Vital Signs [name], [name] ([id number]) Has the patient been seen at the hospital within the last three years: Yes Total Score: 45 Level Of Care: New/Established - Level 2 Electronic Signature(s) Signed: 01/23/2019 5:05:56 PM By: Gretta Cool, BSN, RN, CWS, [name] RN, BSN Entered By: Gretta Cool, BSN, RN, CWS, [name] on 01/23/2019 13:12:35 [name] ([id number]) -------------------------------------------------------------------------------- Encounter Discharge Information Details Patient Name: [name], [name]. Date of Service: 01/23/2019 12:45 PM Medical Record Number: [id number] Patient Account Number: [id number] Date of Birth/Sex: 1950-10-20 (68 y.o. F) Treating RN: Cornell Barman Primary Care Davanna He: Crist Infante Other Clinician: Referring Pattricia Weiher: Crist Infante Treating Odessia Asleson/Extender: Melburn Hake, HOYT Weeks in Treatment:  59 Encounter Discharge Information Items Discharge Condition: Stable Ambulatory Status: Ambulatory Discharge Destination: Home Transportation: Private Auto Accompanied By: self Schedule Follow-up Appointment: Yes Clinical Summary of Care: Electronic Signature(s) Signed: 01/23/2019 5:05:56 PM By: Gretta Cool, BSN, RN, CWS, [name] RN, BSN Entered By: Gretta Cool, BSN, RN, CWS, [name] on 01/23/2019 13:13:33 [name] ([id number]) -------------------------------------------------------------------------------- Lower Extremity Assessment Details Patient Name: [name], [name]. Date of Service: 01/23/2019 12:45 PM Medical Record Number: [id number] Patient Account Number: [id number] Date of Birth/Sex: 1951-03-25 (68 y.o. F) Treating RN: Harold Barban Primary Care Stacye Noori: Crist Infante Other Clinician: Referring Krisy Dix: Crist Infante Treating Quiara Killian/Extender: Melburn Hake, HOYT Weeks in Treatment: 19 Vascular Assessment Pulses: Dorsalis Pedis Palpable: [Right:Yes] Posterior Tibial Palpable: [Right:Yes] Electronic Signature(s) Signed: 01/23/2019 4:49:02 PM By: Harold Barban Entered By: Harold Barban on 01/23/2019 12:59:49 [name] ([id number]) -------------------------------------------------------------------------------- Multi Wound Chart Details Patient Name: [name], [name]. Date of Service: 01/23/2019 12:45 PM Medical Record Number: [id number] Patient Account Number: [id number] Date of Birth/Sex: 1951-07-31 (67 y.o. F) Treating RN: Cornell Barman Primary Care Darrien Laakso: Crist Infante Other Clinician: Referring Maryama Kuriakose: Crist Infante Treating Lucyle Alumbaugh/Extender: Melburn Hake, HOYT Weeks in Treatment: 19 Vital Signs Height(in): 63 Pulse(bpm): 54 Weight(lbs): 174 Blood Pressure(mmHg): 110/47 Body Mass Index(BMI): 31 Temperature(F): 98.6 Respiratory Rate 18 (breaths/min): Photos: [N/A:N/A] Wound Location: Right, Lateral Lower Leg N/A N/A Wounding Event: Bump N/A N/A Primary Etiology:  Auto-immune N/A N/A Comorbid History: Arrhythmia, Deep Vein N/A N/A Thrombosis, Hypertension, Osteoarthritis, Received Chemotherapy Date Acquired: 08/01/2018 N/A N/A Weeks of Treatment: 19 N/A N/A Wound Status: Healed - Epithelialized N/A N/A Measurements L x W x D 0x0x0 N/A N/A (cm) Area (cm) : 0 N/A N/A Volume (cm) : 0 N/A N/A % Reduction in Area: 99.80% N/A N/A % Reduction in Volume: 100.00% N/A N/A Classification: Full Thickness Without N/A N/A Exposed Support Structures Exudate Amount: None  Present N/A N/A Wound Margin: Flat and Intact N/A N/A Granulation Amount: Large (67-100%) N/A N/A Granulation Quality: Pink N/A N/A Necrotic Amount: Small (1-33%) N/A N/A Exposed Structures: Fascia: No N/A N/A Fat Layer (Subcutaneous Tissue) Exposed: No Tendon: No Muscle: No [name], [name] ([id number]) Joint: No Bone: No Epithelialization: Small (1-33%) N/A N/A Treatment Notes Electronic Signature(s) Signed: 01/23/2019 5:05:56 PM By: Gretta Cool, BSN, RN, CWS, [name] RN, BSN Entered By: Gretta Cool, BSN, RN, CWS, [name] on 01/23/2019 13:08:07 [name] ([id number]) -------------------------------------------------------------------------------- Multi-Disciplinary Care Plan Details Patient Name: [name], [name]. Date of Service: 01/23/2019 12:45 PM Medical Record Number: [id number] Patient Account Number: [id number] Date of Birth/Sex: 13-May-1951 (68 y.o. F) Treating RN: Cornell Barman Primary Care Alisen Marsiglia: Crist Infante Other Clinician: Referring Tameka Hoiland: Crist Infante Treating Stella Bortle/Extender: Sharalyn Ink in Treatment: 36 Active Inactive Electronic Signature(s) Signed: 01/23/2019 5:05:56 PM By: Gretta Cool, BSN, RN, CWS, [name] RN, BSN Entered By: Gretta Cool, BSN, RN, CWS, [name] on 01/23/2019 13:07:55 [name] ([id number]) -------------------------------------------------------------------------------- Pain Assessment Details Patient Name: [name], [name]. Date of Service: 01/23/2019 12:45  PM Medical Record Number: [id number] Patient Account Number: [id number] Date of Birth/Sex: [birth year] (68 y.o. F) Treating RN: Harold Barban Primary Care Herold Salguero: Crist Infante Other Clinician: Referring Jude Naclerio: Crist Infante Treating Boubacar Lerette/Extender: Melburn Hake, HOYT Weeks in Treatment: 19 Active Problems Location of Pain Severity and Description of Pain Patient Has Paino No Site Locations Pain Management and Medication Current Pain Management: Electronic Signature(s) Signed: 01/23/2019 4:49:02 PM By: Harold Barban Entered By: Harold Barban on 01/23/2019 12:55:48 [name] ([id number]) -------------------------------------------------------------------------------- Patient/Caregiver Education Details Patient Name: [name], [name]. Date of Service: 01/23/2019 12:45 PM Medical Record Number: [id number] Patient Account Number: [id number] Date of Birth/Gender: 12-02-1950 (68 y.o. F) Treating RN: Cornell Barman Primary Care Physician: Crist Infante Other Clinician: Referring Physician: Crist Infante Treating Physician/Extender: Sharalyn Ink in Treatment: 63 Education Assessment Education Provided To: Patient Education Topics Provided Wound/Skin Impairment: Handouts: Caring for Your Ulcer, Other: protect new skin Methods: Demonstration, Explain/Verbal Responses: State content correctly Electronic Signature(s) Signed: 01/23/2019 5:05:56 PM By: Gretta Cool, BSN, RN, CWS, [name] RN, BSN Entered By: Gretta Cool, BSN, RN, CWS, [name] on 01/23/2019 13:13:05 [name] ([id number]) -------------------------------------------------------------------------------- Wound Assessment Details Patient Name: [name], [name]. Date of Service: 01/23/2019 12:45 PM Medical Record Number: [id number] Patient Account Number: [id number] Date of Birth/Sex: [birth year] (68 y.o. F) Treating RN: Cornell Barman Primary Care Dyllon Henken: Crist Infante Other Clinician: Referring Aoi Kouns: Crist Infante Treating  Lilac Hoff/Extender: Melburn Hake, HOYT Weeks in Treatment: 19 Wound Status Wound Number: 1 Primary Auto-immune Etiology: Wound Location: Right, Lateral Lower Leg Wound Healed - Epithelialized Wounding Event: Bump Status: Date Acquired: 08/01/2018 Comorbid Arrhythmia, Deep Vein Thrombosis, Weeks Of Treatment: 19 History: Hypertension, Osteoarthritis, Received Clustered Wound: No Chemotherapy Photos Wound Measurements Length: (cm) 0 % Reducti Width: (cm) 0 % Reducti Depth: (cm) 0 Epithelia Area: (cm) 0 Tunnelin Volume: (cm) 0 Undermin on in Area: 99.8% on in Volume: 100% lization: Small (1-33%) g: No ing: No Wound Description Full Thickness Without Exposed Support Foul Odo Classification: Structures Slough/F Wound Margin: Flat and Intact Exudate None Present Amount: r After Cleansing: No ibrino Yes Wound Bed Granulation Amount: Large (67-100%) Exposed Structure Granulation Quality: Pink Fascia Exposed: No Necrotic Amount: Small (1-33%) Fat Layer (Subcutaneous Tissue) Exposed: No Necrotic Quality: Adherent Slough Tendon Exposed: No Muscle Exposed: No Joint Exposed: No Bone Exposed: No Electronic Signature(s) GLADA, WICKSTROM ([id number]) Signed: 01/23/2019 5:05:56 PM By: Gretta Cool, BSN, RN, CWS, [name] RN, BSN  Entered By: Gretta Cool, BSN, RN, CWS, [name] on 01/23/2019 13:07:39 [name] ([id number]) -------------------------------------------------------------------------------- Harmony Details Patient Name: [name], FITZPATRICK. Date of Service: 01/23/2019 12:45 PM Medical Record Number: [id number] Patient Account Number: [id number] Date of Birth/Sex: 07/16/1951 (68 y.o. F) Treating RN: Harold Barban Primary Care Sian Joles: Crist Infante Other Clinician: Referring Rosmarie Esquibel: Crist Infante Treating Janaiah Vetrano/Extender: Melburn Hake, HOYT Weeks in Treatment: 19 Vital Signs Time Taken: 12:55 Temperature (F): 98.6 Height (in): 63 Pulse (bpm): 54 Weight (lbs): 174 Respiratory Rate  (breaths/min): 18 Body Mass Index (BMI): 30.8 Blood Pressure (mmHg): 110/47 Reference Range: 80 - 120 mg / dl Electronic Signature(s) Signed: 01/23/2019 4:49:02 PM By: Harold Barban Entered By: Harold Barban on 01/23/2019 12:56:05

## 2019-01-24 NOTE — Progress Notes (Signed)
I agree with the above plan 

## 2019-01-24 NOTE — Progress Notes (Signed)
DALYCE, RENNE ([id number]) Visit Report for 01/23/2019 Chief Complaint Document Details Patient Name: [name], [name]. Date of Service: 01/23/2019 12:45 PM Medical Record Number: [id number] Patient Account Number: [id number] Date of Birth/Sex: [birth year] (68 y.o. F) Treating RN: Cornell Barman Primary Care Provider: Crist Infante Other Clinician: Referring Provider: Crist Infante Treating Provider/Extender: Worthy Keeler Weeks in Treatment: 19 Information Obtained from: Patient Chief Complaint Right LE Ulcer Electronic Signature(s) Signed: 01/24/2019 4:38:37 PM By: Worthy Keeler PA-C Entered By: Worthy Keeler on 01/23/2019 14:36:26 Rains, [name] ([id number]) -------------------------------------------------------------------------------- HPI Details Patient Name: [name] Date of Service: 01/23/2019 12:45 PM Medical Record Number: [id number] Patient Account Number: [id number] Date of Birth/Sex: 12/05/1950 (68 y.o. 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 She states that she often has areas like this that will come up but normally not to the severity. She's typically able to take care of these herself. However she's been having a lot of trouble since this past summer in particular. 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 She was most recently treated with this by her primary care provider. Fortunately that seems to have cleared up any infection that she may have had. Nonetheless other pertinent medical history includes the chronic long-term use of Eliquis secondary to atrial  fibrillation. Her ABI appear to be okay today. She did have a DVT 13 years ago and this lag but has not had anything recently. The Eliquis seems to be beneficial in this regard. Otherwise there's no evidence of active infection at this time. No fevers, chills, nausea, or vomiting noted at this time. 09/19/18 on evaluation today patient presents for follow-up concerning her ongoing lower extremity wound. This is her second visit here in our clinic. She does have slough covering the surface of the wound today. Fortunately there is no signs of infection at this time. Overall I have been pleased with the interval change although she still has a lot of Slough I do feel like the Iodoflex was of benefit for her. We did have to change her wrap once in the weeks since I last saw her fortunately after that changed nothing seem to smell or drain through. 09/27/18 on evaluation today patient appears to be doing better in regard to her right lateral lower extremity ulcer. She's been tolerating the dressing changes without complication. Fortunately there is no sign of infection at this time. With that being said we have had to change the wrap at least once in the interim between when we see her and when she comes back in for evaluation due to the amount of drainage an odor. With that being said she is getting very frustrated with the length of time is taken get this to heal. Fortunately there is no evidence of anything worsening and a lot of the slough is improving she has good granulation seems to be peeking out from underneath. She is still having discomfort unfortunately. 10/03/18 on evaluation today patient's wound actually appears to be doing better from the standpoint of feeling in. With that being said she actually does have some slough covering the surface of the wound unfortunately. This is going to  require sharp debridement prior to application of EpiFix. 10/15/18 on evaluation today patient actually appears  to be doing much better in regard to her wound. I do believe that the EpiFix has been of benefit for her which is excellent news. Overall I think that this is something I would definitely recommend continuing I've seen definite progress. 10/22/18 on evaluation today patient actually appears to be doing very well in regard to her leg ulcer. This is shown signs of excellent improvement measurement wise she is continuing to week by week make great progress in my pinion. Overall very pleased in this regard. Fortunately there's no signs of infection at this time. I do believe that the EpiFix has been beneficial for her. 10/29/18 on evaluation today patient actually appears to be doing very well today. The EpiFix does seem to be benefiting her as far as the overall improvement we have seen is concerned. Fortunately there's no signs of infection at this time and again her pain is much less than what it used to be. Overall I feel like she's making excellent progress. 11/12/18 on evaluation today patient presents after unfortunately having had a stroke since the last time I seen her until today. She tells me that she's actually doing very well in this regard she did have some rehab in the hospital and then subsequently was discharged to home. She is home at this point. Nonetheless she has physical therapy a potential occupational therapy is gonna be coming out to see her. Nonetheless she tells me that she is not having any evidence at this time of active infection which is good news she's not having any evidence of pain either in regard to the right lateral lower extremity. Overall I feel like she has done well as far as I'm concerned in the EpiFix which did stay in place for longer than the week obviously I think still have good effect based on what I'm seeing at this point the wound is significantly smaller. [name], [name] ([id number]) 11/18/18 on evaluation today patient comes in one day early for evaluation  here in our clinic due to issues that she's having with her right lower extremity she states she was having a lot of discomfort and could not wait any longer to have this checked. Fortunately she did come in because she's actually having what appears to be significant infection which needs to be addressed I did not want this to continue without being addressed. No fevers, chills, nausea, or vomiting noted at this time. 11/26/18 on evaluation today patient will significantly better compared to last week regarding her lower extremity ulcer. Last week she had an infection we finally did get the results of the culture back which did show methicillin-resistant Staphylococcus aureus as the positive organism. Subsequently the antibiotic that she's been on has completely resolved the infection as best I can tell she seems to be doing absolutely excellent today. I'm very pleased in this regard. She's having little to no drainage and no pain. Last week and this was very painful. 12/03/18 on evaluation today patient appears to be doing rather well in regard to her lateral lower extremity ulcer on the right. She's been tolerating the dressing changes without complication. Fortunately the wound is shown signs of good improvement this week. The infection is in your much better control and I think were making excellent progress. 12/09/18 on evaluation today patient appears to be doing decently well in regard to her lower Trinity ulcer this point. Fortunately there's no  signs of active infection at this time. Overall I'm very pleased with how things seem to be progressing. With that being said although I did want to see her today to ensure there's no signs of active infection I did want to also see about leaving the EpiFix in place for one additional week before complete removal. 12/16/18 on evaluation today patient actually appears to be doing very well in regard to her wound. She has some dressing material stuck to  the surface the wound but other than this seems to be making good progress. There's no signs of active infection at this time which is good news. 12/23/18 on evaluation today patient appears to be doing rather well in regard to her lower extremity ulcer. The biggest thing I'm seeing is that the Prisma still continues to get somewhat dry and stuck into the wound bed which I think is somewhat slowing things down as far as healing is concerned. Fortunately there's no signs of active infection at this time which is good news. 12/30/18 on evaluation today patient actually appears to be making good progress with regard to her lower extremity ulcer. There's no signs of active infection at this time which is good news. No fevers, chills, nausea, or vomiting noted at this time. 01/06/19 on evaluation today patient actually appears to be doing very well in regard to her lower Trinity ulcer. She's been tolerating the dressing changes without complication. Fortunately there's no signs of active infection at this time which is also good news. Overall very pleased with how things seem to be progressing. 01/23/19 on evaluation today patient appears to be doing excellent in regard to her right lateral lower extremity ulcer. In fact this appears to be completely healed which is excellent news. Overall been very pleased with the progress and how things have gone up to this point. No evidence of infection is noted. She does have lower extremity edema. Electronic Signature(s) Signed: 01/24/2019 4:38:37 PM By: Worthy Keeler PA-C Entered By: Worthy Keeler on 01/23/2019 14:36:38 [name] ([id number]) -------------------------------------------------------------------------------- Physical Exam Details Patient Name: [name], [name]. Date of Service: 01/23/2019 12:45 PM Medical Record Number: [id number] Patient Account Number: [id number] Date of Birth/Sex: 1951/05/29 (68 y.o. F) Treating RN: Cornell Barman Primary Care  Provider: Crist Infante Other Clinician: Referring Provider: Crist Infante Treating Provider/Extender: Melburn Hake, Shemika Robbs Weeks in Treatment: 8 Constitutional Well-nourished and well-hydrated in no acute distress. Respiratory normal breathing without difficulty. Psychiatric this patient is able to make decisions and demonstrates good insight into disease process. Alert and Oriented x 3. pleasant and cooperative. Notes Patient's wound bed showed signs of good epithelialization at this time which is excellent news. There does not appear to be open wound that I can find at this time she's having no discomfort which is great news. Electronic Signature(s) Signed: 01/24/2019 4:38:37 PM By: Worthy Keeler PA-C Entered By: Worthy Keeler on 01/23/2019 14:37:15 [name] ([id number]) -------------------------------------------------------------------------------- Physician Orders Details Patient Name: [name], [name]. Date of Service: 01/23/2019 12:45 PM Medical Record Number: [id number] Patient Account Number: [id number] Date of Birth/Sex: Dec 16, 1950 (68 y.o. F) Treating RN: Cornell Barman Primary Care Provider: Crist Infante Other Clinician: Referring Provider: Crist Infante Treating Provider/Extender: Melburn Hake, Jay Haskew Weeks in Treatment: 30 Verbal / Phone Orders: No Diagnosis Coding Discharge From [facility] o Discharge from [facility] Signature(s) Signed: 01/23/2019 5:05:56 PM By: Gretta Cool, BSN, RN, CWS, [name] RN, BSN Signed: 01/24/2019 4:38:37 PM By: Worthy Keeler PA-C Entered  By: Gretta Cool, BSN, RN, CWS, [name] on 01/23/2019 13:11:17 [name] ([id number]) -------------------------------------------------------------------------------- Problem List Details Patient Name: JEANY, SEVILLE. Date of Service: 01/23/2019 12:45 PM Medical Record Number: [id number] Patient Account Number: [id number] Date of Birth/Sex: 01-11-1951 (68 y.o. 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 09/12/2018 No Yes L97.812 Non-pressure chronic ulcer of other part of right lower leg 09/12/2018 No Yes with fat layer exposed I48.0 Paroxysmal atrial fibrillation 09/12/2018 No Yes I10 Essential (primary) hypertension 09/12/2018 No Yes Z79.01 Long term (current) use of anticoagulants 09/12/2018 No Yes Inactive Problems Resolved Problems Electronic Signature(s) Signed: 01/24/2019 4:38:37 PM By: Worthy Keeler PA-C Entered By: Worthy Keeler on 01/23/2019 14:36:18 Below, [name] ([id number]) -------------------------------------------------------------------------------- Progress Note Details Patient Name: [name] Date of Service: 01/23/2019 12:45 PM Medical Record Number: [id number] Patient Account Number: [id number] Date of Birth/Sex: 1950/11/29 (68 y.o. 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 She states that she often has areas like this that will come up but normally not to the severity. She's typically able to take care of these herself. However she's been having a lot of trouble since this past summer in particular. 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 She was most recently treated with this by her primary care provider.  Fortunately that seems to have cleared up any infection that she may have had. Nonetheless other pertinent medical history includes the chronic long-term use of Eliquis secondary to atrial fibrillation. Her ABI appear to be okay today. She did have a DVT 13 years ago and this lag but has not had anything recently. The Eliquis seems to be beneficial in this regard. Otherwise there's no evidence of active infection at this time. No fevers, chills, nausea, or vomiting noted at this time. 09/19/18 on evaluation today patient presents for follow-up concerning her ongoing lower extremity wound. This is her second visit here in our clinic. She does have slough covering the surface of the wound today. Fortunately there is no signs of infection at this time. Overall I have been pleased with the interval change although she still has a lot of Slough I do feel like the Iodoflex was of benefit for her. We did have to change her wrap once in the weeks since I last saw her fortunately after that changed nothing seem to smell or drain through. 09/27/18 on evaluation today patient appears to be doing better in regard to her right lateral lower extremity ulcer. She's been tolerating the dressing changes without complication. Fortunately there is no sign of infection at this time. With that being said we have had to change the wrap at least once in the interim between when we see her and when she comes back in for evaluation due to the amount of drainage an odor. With that being said she is getting very frustrated with the length of time is taken get this to  heal. Fortunately there is no evidence of anything worsening and a lot of the slough is improving she has good granulation seems to be peeking out from underneath. She is still having discomfort unfortunately. 10/03/18 on evaluation today patient's wound actually appears to be doing better from the standpoint of feeling in. With that being said she actually does have  some slough covering the surface of the wound unfortunately. This is going to require sharp debridement prior to application of EpiFix. 10/15/18 on evaluation today patient actually appears to be doing much better in regard to her wound. I do believe that the EpiFix has been of benefit for her which is excellent news. Overall I think that this is something I would definitely recommend continuing I've seen definite progress. 10/22/18 on evaluation today patient actually appears to be doing very well in regard to her leg ulcer. This is shown signs of excellent improvement measurement wise she is continuing to week by week make great progress in my pinion. Overall very pleased in this regard. Fortunately there's no signs of infection at this time. I do believe that the EpiFix has been beneficial for her. 10/29/18 on evaluation today patient actually appears to be doing very well today. The EpiFix does seem to be benefiting her as far as the overall improvement we have seen is concerned. Fortunately there's no signs of infection at this time and again her pain is much less than what it used to be. Overall I feel like she's making excellent progress. 11/12/18 on evaluation today patient presents after unfortunately having had a stroke since the last time I seen her until today. [name], [name] ([id number]) She tells me that she's actually doing very well in this regard she did have some rehab in the hospital and then subsequently was discharged to home. She is home at this point. Nonetheless she has physical therapy a potential occupational therapy is gonna be coming out to see her. Nonetheless she tells me that she is not having any evidence at this time of active infection which is good news she's not having any evidence of pain either in regard to the right lateral lower extremity. Overall I feel like she has done well as far as I'm concerned in the EpiFix which did stay in place for longer than the week  obviously I think still have good effect based on what I'm seeing at this point the wound is significantly smaller. 11/18/18 on evaluation today patient comes in one day early for evaluation here in our clinic due to issues that she's having with her right lower extremity she states she was having a lot of discomfort and could not wait any longer to have this checked. Fortunately she did come in because she's actually having what appears to be significant infection which needs to be addressed I did not want this to continue without being addressed. No fevers, chills, nausea, or vomiting noted at this time. 11/26/18 on evaluation today patient will significantly better compared to last week regarding her lower extremity ulcer. Last week she had an infection we finally did get the results of the culture back which did show methicillin-resistant Staphylococcus aureus as the positive organism. Subsequently the antibiotic that she's been on has completely resolved the infection as best I can tell she seems to be doing absolutely excellent today. I'm very pleased in this regard. She's having little to no drainage and no pain. Last week and this was very painful. 12/03/18 on evaluation today patient appears  to be doing rather well in regard to her lateral lower extremity ulcer on the right. She's been tolerating the dressing changes without complication. Fortunately the wound is shown signs of good improvement this week. The infection is in your much better control and I think were making excellent progress. 12/09/18 on evaluation today patient appears to be doing decently well in regard to her lower Trinity ulcer this point. Fortunately there's no signs of active infection at this time. Overall I'm very pleased with how things seem to be progressing. With that being said although I did want to see her today to ensure there's no signs of active infection I did want to also see about leaving the EpiFix in place  for one additional week before complete removal. 12/16/18 on evaluation today patient actually appears to be doing very well in regard to her wound. She has some dressing material stuck to the surface the wound but other than this seems to be making good progress. There's no signs of active infection at this time which is good news. 12/23/18 on evaluation today patient appears to be doing rather well in regard to her lower extremity ulcer. The biggest thing I'm seeing is that the Prisma still continues to get somewhat dry and stuck into the wound bed which I think is somewhat slowing things down as far as healing is concerned. Fortunately there's no signs of active infection at this time which is good news. 12/30/18 on evaluation today patient actually appears to be making good progress with regard to her lower extremity ulcer. There's no signs of active infection at this time which is good news. No fevers, chills, nausea, or vomiting noted at this time. 01/06/19 on evaluation today patient actually appears to be doing very well in regard to her lower Trinity ulcer. She's been tolerating the dressing changes without complication. Fortunately there's no signs of active infection at this time which is also good news. Overall very pleased with how things seem to be progressing. 01/23/19 on evaluation today patient appears to be doing excellent in regard to her right lateral lower extremity ulcer. In fact this appears to be completely healed which is excellent news. Overall been very pleased with the progress and how things have gone up to this point. No evidence of infection is noted. She does have lower extremity edema. Patient History Information obtained from Patient. Family History Cancer - Father, Diabetes - Maternal Grandparents, Lung Disease - Father, Stroke - Father, No family history of Heart Disease, Hereditary Spherocytosis, Hypertension, Kidney Disease, Seizures, Thyroid  Problems, Tuberculosis. Social History Never smoker, Marital Status - Married, Alcohol Use - Never, Drug Use - No History, Caffeine Use - Daily. Medical History Eyes [name], [name] ([id number]) Denies history of Cataracts, Glaucoma, Optic Neuritis Ear/Nose/Mouth/Throat Denies history of Chronic sinus problems/congestion, Middle ear problems Hematologic/Lymphatic Denies history of Anemia, Hemophilia, Human Immunodeficiency Virus, Sickle Cell Disease Respiratory Denies history of Aspiration, Asthma, Chronic Obstructive Pulmonary Disease (COPD), Pneumothorax, Sleep Apnea, Tuberculosis Cardiovascular Patient has history of Arrhythmia - a fib, Deep Vein Thrombosis - 13 years ago, Hypertension Denies history of Angina, Congestive Heart Failure, Coronary Artery Disease, Hypotension, Myocardial Infarction, Peripheral Arterial Disease, Peripheral Venous Disease, Phlebitis, Vasculitis Gastrointestinal Denies history of Cirrhosis , Colitis, Crohn s, Hepatitis A, Hepatitis B, Hepatitis C Endocrine Denies history of Type I Diabetes, Type II Diabetes Genitourinary Denies history of End Stage Renal Disease Immunological Denies history of Lupus Erythematosus, Raynaud s, Scleroderma Integumentary (Skin) Denies history of History of Burn,  History of pressure wounds Musculoskeletal Patient has history of Osteoarthritis Denies history of Gout, Rheumatoid Arthritis, Osteomyelitis Neurologic Denies history of Dementia, Neuropathy, Quadriplegia, Paraplegia, Seizure Disorder Oncologic Patient has history of Received Chemotherapy - past for Wegeners Denies history of Received Radiation Psychiatric Denies history of Anorexia/bulimia, Confinement Anxiety Medical And Surgical History Notes Immunological Wegeners Musculoskeletal OP Review of Systems (ROS) Constitutional Symptoms (General Health) Denies complaints or symptoms of Fatigue, Fever, Chills, Marked Weight Change. Respiratory Denies  complaints or symptoms of Chronic or frequent coughs, Shortness of Breath. Cardiovascular Complains or has symptoms of LE edema. Denies complaints or symptoms of Chest pain. Objective Constitutional [name], [name] ([id number]) Well-nourished and well-hydrated in no acute distress. Vitals Time Taken: 12:55 PM, Height: 63 in, Weight: 174 lbs, BMI: 30.8, Temperature: 98.6 F, Pulse: 54 bpm, Respiratory Rate: 18 breaths/min, Blood Pressure: 110/47 mmHg. Respiratory normal breathing without difficulty. Psychiatric this patient is able to make decisions and demonstrates good insight into disease process. Alert and Oriented x 3. pleasant and cooperative. General Notes: Patient's wound bed showed signs of good epithelialization at this time which is excellent news. There does not appear to be open wound that I can find at this time she's having no discomfort which is great news. Integumentary (Hair, Skin) Wound #1 status is Healed - Epithelialized. Original cause of wound was Bump. The wound is located on the Right,Lateral Lower Leg. The wound measures 0cm length x 0cm width x 0cm depth; 0cm^2 area and 0cm^3 volume. There is no tunneling or undermining noted. There is a none present amount of drainage noted. The wound margin is flat and intact. There is large (67-100%) pink granulation within the wound bed. There is a small (1-33%) amount of necrotic tissue within the wound bed including Adherent Slough. 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 Services: Discharge from [place] My suggestion at this point is that we discontinue wound care measures I do think that she needs to wear compression stockings on a regular basis. This  was discussed with the patient today. She is going to do this. If anything changes or worsens in the meantime shall contact the office and let me know. Electronic Signature(s) [name], [name] ([id number]) Signed: 01/24/2019 4:38:37 PM By: Worthy Keeler PA-C Entered By: Worthy Keeler on 01/23/2019 [name], [name] ([id number]) -------------------------------------------------------------------------------- ROS/PFSH Details Patient Name: [name], [name]. Date of Service: 01/23/2019 12:45 PM Medical Record Number: [id number] Patient Account Number: [id number] Date of Birth/Sex: 1951/05/03 (68 y.o. F) Treating RN: Cornell Barman Primary Care Provider: Crist Infante Other Clinician: Referring Provider: Crist Infante Treating Provider/Extender: Melburn Hake, Vermon Grays Weeks in Treatment: 19 Information Obtained From Patient Constitutional Symptoms (General Health) Complaints and Symptoms: Negative for: Fatigue; Fever; Chills; Marked Weight Change Respiratory Complaints and Symptoms: Negative for: Chronic or frequent coughs; Shortness of Breath Medical History: Negative for: Aspiration; Asthma; Chronic Obstructive Pulmonary Disease (COPD); Pneumothorax; Sleep Apnea; Tuberculosis Cardiovascular Complaints and Symptoms: Positive for: LE edema Negative for: Chest pain Medical History: Positive for: Arrhythmia - a fib; Deep Vein Thrombosis - 13 years ago; Hypertension Negative for: Angina; Congestive Heart Failure; Coronary Artery Disease; Hypotension; Myocardial Infarction; Peripheral Arterial Disease; Peripheral Venous Disease; Phlebitis; Vasculitis Eyes Medical History: Negative for: Cataracts; Glaucoma; Optic Neuritis Ear/Nose/Mouth/Throat Medical History: Negative for: Chronic sinus problems/congestion; Middle ear  problems Hematologic/Lymphatic Medical History: Negative for: Anemia; Hemophilia; Human Immunodeficiency Virus; Sickle Cell Disease Gastrointestinal Medical History: Negative  for: Cirrhosis ; Colitis; Crohnos; Hepatitis A; Hepatitis B; Hepatitis C Endocrine [name], TEST ([id number]) Medical History: Negative for: Type I Diabetes; Type II Diabetes Genitourinary Medical History: Negative for: End Stage Renal Disease Immunological Medical History: Negative for: Lupus Erythematosus; Raynaudos; Scleroderma Past Medical History Notes: Wegeners Integumentary (Skin) Medical History: Negative for: History of Burn; History of pressure wounds Musculoskeletal Medical History: Positive for: Osteoarthritis Negative for: Gout; Rheumatoid Arthritis; Osteomyelitis Past Medical History Notes: OP Neurologic Medical History: Negative for: Dementia; Neuropathy; Quadriplegia; Paraplegia; Seizure Disorder Oncologic Medical History: Positive for: Received Chemotherapy - past for Wegeners Negative for: Received Radiation Psychiatric Medical History: Negative for: Anorexia/bulimia; Confinement Anxiety Immunizations Pneumococcal Vaccine: Received Pneumococcal Vaccination: Yes Implantable Devices No devices added Family and Social History Cancer: Yes - Father; Diabetes: Yes - Maternal Grandparents; Heart Disease: No; Hereditary Spherocytosis: No; Hypertension: No; Kidney Disease: No; Lung Disease: Yes - Father; Seizures: No; Stroke: Yes - Father; Thyroid Problems: No; Tuberculosis: No; Never smoker; Marital Status - Married; Alcohol Use: Never; Drug Use: No History; Caffeine Use: Daily; Financial Concerns: No; Food, Clothing or Shelter Needs: No; Support System Lacking: No; Transportation Concerns: No Physician [place], [place] ([id number]) I have reviewed and agree with the above information. Electronic Signature(s) Signed: 01/23/2019 5:05:56 PM By: Gretta Cool, BSN, RN, CWS, [name] RN, BSN Signed: 01/24/2019 4:38:37 PM By: Worthy Keeler PA-C Entered By: Worthy Keeler on 01/23/2019 14:36:59 [place], [place]  ([id number]) -------------------------------------------------------------------------------- SuperBill Details Patient Name: KAMYAH, WILHELMSEN. Date of Service: 01/23/2019 Medical Record Number: [id number] Patient Account Number: [id number] Date of Birth/Sex: Mar 27, 1951 (68 y.o. 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 with fat layer exposed I48.0 Paroxysmal atrial fibrillation I10 Essential (primary) hypertension Z79.01 Long term (current) use of anticoagulants Facility Procedures CPT4 Code: [id number] Description: [phone] - WOUND CARE VISIT-LEV 2 EST PT Modifier: Quantity: 1 Physician Procedures CPT4 Code Description: [id number] [place] PHYS LEVEL 3 o NEW PT ICD-10 Diagnosis Description 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

## 2019-02-24 ENCOUNTER — Other Ambulatory Visit: Payer: Self-pay | Admitting: Internal Medicine

## 2019-02-24 DIAGNOSIS — Z1231 Encounter for screening mammogram for malignant neoplasm of breast: Secondary | ICD-10-CM

## 2019-02-25 ENCOUNTER — Encounter: Payer: Medicare Other | Attending: Physical Medicine & Rehabilitation | Admitting: Physical Medicine & Rehabilitation

## 2019-02-25 ENCOUNTER — Other Ambulatory Visit: Payer: Self-pay

## 2019-02-25 VITALS — BP 109/62 | HR 48 | Temp 97.5°F | Ht 63.0 in | Wt 172.0 lb

## 2019-02-25 DIAGNOSIS — R269 Unspecified abnormalities of gait and mobility: Secondary | ICD-10-CM | POA: Diagnosis not present

## 2019-02-25 DIAGNOSIS — I6381 Other cerebral infarction due to occlusion or stenosis of small artery: Secondary | ICD-10-CM

## 2019-02-25 DIAGNOSIS — I69398 Other sequelae of cerebral infarction: Secondary | ICD-10-CM | POA: Diagnosis not present

## 2019-02-25 NOTE — Patient Instructions (Signed)
Please call if you don't hear from [facility] in the next week

## 2019-02-25 NOTE — Progress Notes (Signed)
Subjective:    Patient ID: [name], female    DOB: Apr 06, 1951, 68 y.o.   MRN: [id number]  HPI   68 year old female with history of Wegener's granulomatosis as well as hypertension.  She has been on chronic prednisone and Imuran and followed by rheumatology at [facility].  She has a history of pulmonary emboli and maintained on Eliquis.  On 315/2020 presented with right-sided weakness and facial droop and slurred speech.  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  She is admitted to the inpatient rehabilitation program on 11/05/2018 and was discharged on 11/08/2018. Hx recent sinus infection related to [place] therapy finished >1 mo ago , outpt therapy ordered, but pt was not contacted per her report.  Pt did not feel like attending therapy due to fatigue from anemia.  Expecting an Fe infusion soon.    Feels like she loses balance, does not feel like she is at her baseline from a mobility standpoint. Pain Inventory Average Pain 2 Pain Right Now 1 My pain is aching  In the last 24 hours, has pain interfered with the following? General activity 1 Relation with others 0 Enjoyment of life 0 What TIME of day is your pain at its worst? morning Sleep (in general) Good  Pain is worse with: none Pain improves with: none Relief from Meds: na  Mobility how many minutes can you walk? 15 ability to climb steps?  yes do you drive?  yes  Function disabled: date disabled 10/2010 retired  Neuro/Psych trouble walking  Prior Studies Any changes since last visit?  no  Physicians involved in your care Primary care Dr. [name] Rheumatologist Dr. Lottie Mussel Hematologist-Dr. Malka So   Family History  Problem Relation Age of Onset  . Hypertension Mother   . Neuropathy Mother   . Varicose Veins Mother   . Stroke Father   . Lung cancer Father   . Arthritis Father   . Cancer Father   . Deep vein thrombosis Father   . Hypertension Father   .  Varicose Veins Father   . Diabetes Maternal Grandfather   . Diabetes Maternal Uncle   . Diabetes Maternal Aunt   . Hypertension Sister   . Colon cancer Neg Hx    Social History   Socioeconomic History  . Marital status: Married    Spouse name: Not on file  . Number of children: N  . Years of education: Not on file  . Highest education level: Not on file  Occupational History  . 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 Financial resource strain: Not on file  . Food insecurity    Worry: Not on file    Inability: Not on file  . Transportation needs    Medical: Not on file    Non-medical: Not on file  Tobacco Use  . Smoking status: Never Smoker  . Smokeless tobacco: Never Used  Substance and Sexual Activity  . Alcohol use: No    Comment: rare  . Drug use: No  . Sexual activity: Yes    Partners: Male    Birth control/protection: Post-menopausal  Lifestyle  . Physical activity    Days per week: Not on file    Minutes per session: Not on file  . Stress: Not on file  Relationships  . Social Herbalist on phone: Not on file    Gets together: Not  on file    Attends religious service: Not on file    Active member of club or organization: Not on file    Attends meetings of clubs or organizations: Not on file    Relationship status: Not on file  Other Topics Concern  . Not on file  Social History Narrative  . Not on file   Past Surgical History:  Procedure Laterality Date  . back injection     steroid injection x2  . BRONCHOSCOPY  april 2012  . iron infusion  11/14   seeing hematologist at [facility]  . lung biospy  may 2012  . PARTIAL HIP ARTHROPLASTY Left 03/2014   Past Medical History:  Diagnosis Date  . Allergic rhinitis   . Anemia    recurrent iron defic.  Marland Kitchen Cervical polyp 03/2003  . DDD (degenerative disc disease)   . Depression    Dr. Toy Care (situational, divorce, loss of parents)   . Diverticulosis   . DVT (deep venous thrombosis) (HCC)    both legs  . Esophageal stricture   . GERD (gastroesophageal reflux disease)   . Hiatal hernia   . Hyperlipidemia   . Hypertension   . Hypertension   . Neuropathy, peripheral   . OSA (obstructive sleep apnea)   . Osteoarthritis   . Pulmonary embolism ([place])    left lung  . Wegner's disease (congenital syphilitic osteochondritis) 2012   DR. John Dye at [facility]   BP 109/62   Pulse (!) 48   Temp (!) 97.5 F (36.4 C)   Ht 5\' 3"  (1.6 m)   Wt 172 lb (78 kg)   LMP 12/20/2002   SpO2 93%   BMI 30.47 kg/m   Opioid Risk Score:   Fall Risk Score:  `1  Depression screen PHQ 2/9  Depression screen [facility] 2/9 02/25/2019 01/16/2019 12/10/2018  Decreased Interest 0 0 0  Down, Depressed, Hopeless 0 0 0  PHQ - 2 Score 0 0 0    Review of Systems  Constitutional: Negative.   HENT: Negative.   Eyes: Negative.   Respiratory: Negative.   Cardiovascular: Negative.   Gastrointestinal: Negative.   Endocrine: Negative.   Genitourinary: Negative.   Musculoskeletal: Positive for gait problem.  Skin: Negative.   Allergic/Immunologic: Negative.   Hematological: Bruises/bleeds easily.  Psychiatric/Behavioral: Negative.   All other systems reviewed and are negative.      Objective:   Physical Exam Constitutional:      Appearance: She is obese.  HENT:     Head: Normocephalic and atraumatic.     Nose: Nose normal.  Eyes:     Extraocular Movements: Extraocular movements intact.     Conjunctiva/sclera: Conjunctivae normal.     Pupils: Pupils are equal, round, and reactive to light.  Skin:    General: Skin is warm and dry.     Comments: Pretibial stasis dermatitis bilaterally.  Neurological:     General: No focal deficit present.     Mental Status: She is alert and oriented to person, place, and time.  Psychiatric:        Mood and Affect: Mood normal.     Motor strength is 5-/5 bilateral deltoid bicep tricep grip 4+/5 bilateral  hip flexor knee extensor ankle dorsiflexor Gait is without evidence of toe drag or knee instability The patient is unable to perform tandem gait. Her sensation is intact to light touch in bilateral lower limbs although she does state she does not have normal feeling when her feet are touched  bilaterally.     Assessment & Plan:  1.  History of left MCA distribution infarct.  She has no residual aphasia.  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  Will make a recommendation for outpatient PT.  Have written order.

## 2019-03-03 ENCOUNTER — Inpatient Hospital Stay (HOSPITAL_COMMUNITY)
Admission: EM | Admit: 2019-03-03 | Discharge: 2019-03-05 | DRG: 378 | Disposition: A | Discharge status: Home / Self Care | Payer: Medicare Other | Attending: Internal Medicine | Admitting: Internal Medicine

## 2019-03-03 ENCOUNTER — Encounter (HOSPITAL_COMMUNITY): Payer: Self-pay | Admitting: Pharmacy Technician

## 2019-03-03 ENCOUNTER — Other Ambulatory Visit: Payer: Self-pay

## 2019-03-03 DIAGNOSIS — I10 Essential (primary) hypertension: Secondary | ICD-10-CM | POA: Diagnosis present

## 2019-03-03 DIAGNOSIS — Z86718 Personal history of other venous thrombosis and embolism: Secondary | ICD-10-CM | POA: Diagnosis not present

## 2019-03-03 DIAGNOSIS — K259 Gastric ulcer, unspecified as acute or chronic, without hemorrhage or perforation: Secondary | ICD-10-CM | POA: Diagnosis not present

## 2019-03-03 DIAGNOSIS — Z20828 Contact with and (suspected) exposure to other viral communicable diseases: Secondary | ICD-10-CM | POA: Diagnosis present

## 2019-03-03 DIAGNOSIS — I679 Cerebrovascular disease, unspecified: Secondary | ICD-10-CM | POA: Diagnosis not present

## 2019-03-03 DIAGNOSIS — K449 Diaphragmatic hernia without obstruction or gangrene: Secondary | ICD-10-CM | POA: Diagnosis present

## 2019-03-03 DIAGNOSIS — F329 Major depressive disorder, single episode, unspecified: Secondary | ICD-10-CM | POA: Diagnosis present

## 2019-03-03 DIAGNOSIS — M313 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: Secondary | ICD-10-CM

## 2019-03-03 DIAGNOSIS — E785 Hyperlipidemia, unspecified: Secondary | ICD-10-CM | POA: Diagnosis present

## 2019-03-03 DIAGNOSIS — Z8249 Family history of ischemic heart disease and other diseases of the circulatory system: Secondary | ICD-10-CM

## 2019-03-03 DIAGNOSIS — I872 Venous insufficiency (chronic) (peripheral): Secondary | ICD-10-CM | POA: Diagnosis present

## 2019-03-03 DIAGNOSIS — K219 Gastro-esophageal reflux disease without esophagitis: Secondary | ICD-10-CM | POA: Diagnosis present

## 2019-03-03 DIAGNOSIS — D500 Iron deficiency anemia secondary to blood loss (chronic): Secondary | ICD-10-CM | POA: Diagnosis not present

## 2019-03-03 DIAGNOSIS — Z7952 Long term (current) use of systemic steroids: Secondary | ICD-10-CM

## 2019-03-03 DIAGNOSIS — I776 Arteritis, unspecified: Secondary | ICD-10-CM

## 2019-03-03 DIAGNOSIS — F419 Anxiety disorder, unspecified: Secondary | ICD-10-CM | POA: Diagnosis present

## 2019-03-03 DIAGNOSIS — K921 Melena: Secondary | ICD-10-CM | POA: Diagnosis present

## 2019-03-03 DIAGNOSIS — K252 Acute gastric ulcer with both hemorrhage and perforation: Secondary | ICD-10-CM | POA: Diagnosis not present

## 2019-03-03 DIAGNOSIS — Z7982 Long term (current) use of aspirin: Secondary | ICD-10-CM

## 2019-03-03 DIAGNOSIS — E669 Obesity, unspecified: Secondary | ICD-10-CM | POA: Diagnosis present

## 2019-03-03 DIAGNOSIS — Z7951 Long term (current) use of inhaled steroids: Secondary | ICD-10-CM

## 2019-03-03 DIAGNOSIS — I491 Atrial premature depolarization: Secondary | ICD-10-CM | POA: Diagnosis not present

## 2019-03-03 DIAGNOSIS — G629 Polyneuropathy, unspecified: Secondary | ICD-10-CM | POA: Diagnosis present

## 2019-03-03 DIAGNOSIS — Z6830 Body mass index (BMI) 30.0-30.9, adult: Secondary | ICD-10-CM

## 2019-03-03 DIAGNOSIS — Z7902 Long term (current) use of antithrombotics/antiplatelets: Secondary | ICD-10-CM

## 2019-03-03 DIAGNOSIS — M810 Age-related osteoporosis without current pathological fracture: Secondary | ICD-10-CM | POA: Diagnosis present

## 2019-03-03 DIAGNOSIS — Z888 Allergy status to other drugs, medicaments and biological substances status: Secondary | ICD-10-CM

## 2019-03-03 DIAGNOSIS — K254 Chronic or unspecified gastric ulcer with hemorrhage: Secondary | ICD-10-CM | POA: Diagnosis not present

## 2019-03-03 DIAGNOSIS — Z79899 Other long term (current) drug therapy: Secondary | ICD-10-CM

## 2019-03-03 DIAGNOSIS — D649 Anemia, unspecified: Secondary | ICD-10-CM | POA: Diagnosis not present

## 2019-03-03 DIAGNOSIS — Z86711 Personal history of pulmonary embolism: Secondary | ICD-10-CM

## 2019-03-03 DIAGNOSIS — D62 Acute posthemorrhagic anemia: Secondary | ICD-10-CM | POA: Diagnosis present

## 2019-03-03 DIAGNOSIS — R42 Dizziness and giddiness: Secondary | ICD-10-CM | POA: Diagnosis not present

## 2019-03-03 DIAGNOSIS — Z96642 Presence of left artificial hip joint: Secondary | ICD-10-CM | POA: Diagnosis present

## 2019-03-03 DIAGNOSIS — D509 Iron deficiency anemia, unspecified: Secondary | ICD-10-CM | POA: Diagnosis present

## 2019-03-03 DIAGNOSIS — G4733 Obstructive sleep apnea (adult) (pediatric): Secondary | ICD-10-CM | POA: Diagnosis present

## 2019-03-03 DIAGNOSIS — R0902 Hypoxemia: Secondary | ICD-10-CM | POA: Diagnosis not present

## 2019-03-03 DIAGNOSIS — Z7901 Long term (current) use of anticoagulants: Secondary | ICD-10-CM

## 2019-03-03 DIAGNOSIS — I959 Hypotension, unspecified: Secondary | ICD-10-CM | POA: Diagnosis not present

## 2019-03-03 DIAGNOSIS — K922 Gastrointestinal hemorrhage, unspecified: Secondary | ICD-10-CM | POA: Diagnosis present

## 2019-03-03 DIAGNOSIS — Z95828 Presence of other vascular implants and grafts: Secondary | ICD-10-CM

## 2019-03-03 DIAGNOSIS — Z8673 Personal history of transient ischemic attack (TIA), and cerebral infarction without residual deficits: Secondary | ICD-10-CM

## 2019-03-03 DIAGNOSIS — Z87898 Personal history of other specified conditions: Secondary | ICD-10-CM

## 2019-03-03 DIAGNOSIS — Z91040 Latex allergy status: Secondary | ICD-10-CM

## 2019-03-03 DIAGNOSIS — R000 Tachycardia, unspecified: Secondary | ICD-10-CM | POA: Diagnosis not present

## 2019-03-03 DIAGNOSIS — J309 Allergic rhinitis, unspecified: Secondary | ICD-10-CM | POA: Diagnosis present

## 2019-03-03 DIAGNOSIS — K2951 Unspecified chronic gastritis with bleeding: Secondary | ICD-10-CM | POA: Diagnosis not present

## 2019-03-03 DIAGNOSIS — Z885 Allergy status to narcotic agent status: Secondary | ICD-10-CM

## 2019-03-03 HISTORY — DX: Iron deficiency anemia secondary to blood loss (chronic): D50.0

## 2019-03-03 HISTORY — DX: Unspecified hearing loss, unspecified ear: H91.90

## 2019-03-03 HISTORY — DX: Unspecified atrial fibrillation: I48.91

## 2019-03-03 HISTORY — DX: Overactive bladder: N32.81

## 2019-03-03 HISTORY — DX: Chronic gastric ulcer without hemorrhage or perforation: K25.7

## 2019-03-03 HISTORY — DX: Other specified disorders of bone density and structure, unspecified site: M85.80

## 2019-03-03 HISTORY — DX: Myelodysplastic disease, not elsewhere classified: C94.6

## 2019-03-03 HISTORY — DX: Long term (current) use of systemic steroids: Z79.52

## 2019-03-03 HISTORY — DX: Myelodysplastic syndrome, unspecified: D46.9

## 2019-03-03 LAB — CBC WITH DIFFERENTIAL/PLATELET
Abs Immature Granulocytes: 0.03 10*3/uL (ref 0.00–0.07)
Basophils Absolute: 0 10*3/uL (ref 0.0–0.1)
Basophils Relative: 0 %
Eosinophils Absolute: 0 10*3/uL (ref 0.0–0.5)
Eosinophils Relative: 0 %
HCT: 13.6 % — ABNORMAL LOW (ref 36.0–46.0)
Hemoglobin: 4 g/dL — CL (ref 12.0–15.0)
Immature Granulocytes: 1 %
Lymphocytes Relative: 8 %
Lymphs Abs: 0.5 10*3/uL — ABNORMAL LOW (ref 0.7–4.0)
MCH: 31.5 pg (ref 26.0–34.0)
MCHC: 29.4 g/dL — ABNORMAL LOW (ref 30.0–36.0)
MCV: 107.1 fL — ABNORMAL HIGH (ref 80.0–100.0)
Monocytes Absolute: 0.5 10*3/uL (ref 0.1–1.0)
Monocytes Relative: 8 %
Neutro Abs: 4.8 10*3/uL (ref 1.7–7.7)
Neutrophils Relative %: 83 %
Platelets: 373 10*3/uL (ref 150–400)
RBC: 1.27 MIL/uL — ABNORMAL LOW (ref 3.87–5.11)
RDW: 16.6 % — ABNORMAL HIGH (ref 11.5–15.5)
WBC: 5.7 10*3/uL (ref 4.0–10.5)
nRBC: 2.3 % — ABNORMAL HIGH (ref 0.0–0.2)

## 2019-03-03 LAB — COMPREHENSIVE METABOLIC PANEL
ALT: 10 U/L (ref 0–44)
AST: 15 U/L (ref 15–41)
Albumin: 3 g/dL — ABNORMAL LOW (ref 3.5–5.0)
Alkaline Phosphatase: 37 U/L — ABNORMAL LOW (ref 38–126)
Anion gap: 8 (ref 5–15)
BUN: 34 mg/dL — ABNORMAL HIGH (ref 8–23)
CO2: 25 mmol/L (ref 22–32)
Calcium: 8.8 mg/dL — ABNORMAL LOW (ref 8.9–10.3)
Chloride: 107 mmol/L (ref 98–111)
Creatinine, Ser: 0.79 mg/dL (ref 0.44–1.00)
GFR calc Af Amer: >60 mL/min (ref >60)
GFR calc non Af Amer: >60 mL/min (ref >60)
Glucose, Bld: 109 mg/dL — ABNORMAL HIGH (ref 70–99)
Potassium: 3.8 mmol/L (ref 3.5–5.1)
Sodium: 140 mmol/L (ref 135–145)
Total Bilirubin: 0.4 mg/dL (ref 0.3–1.2)
Total Protein: 5.6 g/dL — ABNORMAL LOW (ref 6.5–8.1)

## 2019-03-03 LAB — HEMOGLOBIN AND HEMATOCRIT, BLOOD
HCT: 15.2 % — ABNORMAL LOW (ref 36.0–46.0)
Hemoglobin: 4.8 g/dL — CL (ref 12.0–15.0)

## 2019-03-03 LAB — URINALYSIS, ROUTINE W REFLEX MICROSCOPIC
Bacteria, UA: NONE SEEN
Bilirubin Urine: NEGATIVE
Glucose, UA: NEGATIVE mg/dL
Hgb urine dipstick: NEGATIVE
Ketones, ur: NEGATIVE mg/dL
Nitrite: NEGATIVE
Protein, ur: NEGATIVE mg/dL
Specific Gravity, Urine: 1.015 (ref 1.005–1.030)
pH: 6 (ref 5.0–8.0)

## 2019-03-03 LAB — POC OCCULT BLOOD, ED: Fecal Occult Bld: POSITIVE — AB

## 2019-03-03 LAB — SARS CORONAVIRUS 2 BY RT PCR (HOSPITAL ORDER, PERFORMED IN ~~LOC~~ HOSPITAL LAB): SARS Coronavirus 2: NEGATIVE

## 2019-03-03 LAB — PREPARE RBC (CROSSMATCH)

## 2019-03-03 LAB — FERRITIN: Ferritin: 6 ng/mL — ABNORMAL LOW (ref 11–307)

## 2019-03-03 MED ORDER — PREDNISONE 5 MG PO TABS
5.0000 mg | ORAL_TABLET | Freq: Every day | ORAL | Status: DC
  Administered 2019-03-03 – 2019-03-05 (×3): 5 mg via ORAL
  Filled 2019-03-03 (×3): qty 1

## 2019-03-03 MED ORDER — SODIUM CHLORIDE 0.9 % IV SOLN
250.0000 mL | INTRAVENOUS | Status: DC | PRN
  Administered 2019-03-04: 14:00:00 via INTRAVENOUS

## 2019-03-03 MED ORDER — DULOXETINE HCL 60 MG PO CPEP
60.0000 mg | ORAL_CAPSULE | Freq: Every day | ORAL | Status: DC
  Administered 2019-03-03 – 2019-03-04 (×2): 60 mg via ORAL
  Filled 2019-03-03 (×2): qty 1

## 2019-03-03 MED ORDER — ACETAMINOPHEN 325 MG PO TABS
650.0000 mg | ORAL_TABLET | ORAL | Status: DC | PRN
  Administered 2019-03-03 – 2019-03-05 (×3): 650 mg via ORAL
  Filled 2019-03-03 (×3): qty 2

## 2019-03-03 MED ORDER — SODIUM CHLORIDE 0.9 % IV SOLN
80.0000 mg | Freq: Once | INTRAVENOUS | Status: DC
  Filled 2019-03-03: qty 80

## 2019-03-03 MED ORDER — HYDROXYUREA 500 MG PO CAPS: 1000.0000 mg | ORAL_CAPSULE | Freq: Every morning | ORAL | Status: DC

## 2019-03-03 MED ORDER — PANTOPRAZOLE SODIUM 40 MG IV SOLR
40.0000 mg | Freq: Two times a day (BID) | INTRAVENOUS | Status: DC
  Administered 2019-03-03 – 2019-03-04 (×3): 40 mg via INTRAVENOUS
  Filled 2019-03-03 (×3): qty 40

## 2019-03-03 MED ORDER — HYDROXYUREA 500 MG PO CAPS: 500.0000 mg | ORAL_CAPSULE | ORAL | Status: DC

## 2019-03-03 MED ORDER — ATORVASTATIN CALCIUM 40 MG PO TABS
40.0000 mg | ORAL_TABLET | Freq: Every day | ORAL | Status: DC
  Administered 2019-03-03 – 2019-03-04 (×2): 40 mg via ORAL
  Filled 2019-03-03 (×2): qty 1

## 2019-03-03 MED ORDER — SODIUM CHLORIDE 0.9% FLUSH
3.0000 mL | Freq: Two times a day (BID) | INTRAVENOUS | Status: DC
  Administered 2019-03-03 – 2019-03-05 (×4): 3 mL via INTRAVENOUS

## 2019-03-03 MED ORDER — GABAPENTIN 600 MG PO TABS
600.0000 mg | ORAL_TABLET | Freq: Two times a day (BID) | ORAL | Status: DC
  Administered 2019-03-03 – 2019-03-05 (×4): 600 mg via ORAL
  Filled 2019-03-03 (×4): qty 1

## 2019-03-03 MED ORDER — SODIUM CHLORIDE 0.9 % IV SOLN
8.0000 mg/h | INTRAVENOUS | Status: DC
  Filled 2019-03-03: qty 80

## 2019-03-03 MED ORDER — AZATHIOPRINE 50 MG PO TABS
100.0000 mg | ORAL_TABLET | Freq: Every day | ORAL | Status: DC
  Administered 2019-03-03 – 2019-03-05 (×3): 100 mg via ORAL
  Filled 2019-03-03 (×3): qty 2

## 2019-03-03 MED ORDER — SODIUM CHLORIDE 0.9% IV SOLUTION: Freq: Once | INTRAVENOUS | Status: DC

## 2019-03-03 MED ORDER — SODIUM CHLORIDE 0.9% FLUSH: 3.0000 mL | INTRAVENOUS | Status: DC | PRN

## 2019-03-03 MED ORDER — HYDROXYUREA 500 MG PO CAPS
500.0000 mg | ORAL_CAPSULE | Freq: Every day | ORAL | Status: DC
  Filled 2019-03-03: qty 1

## 2019-03-03 MED ORDER — LORAZEPAM 0.5 MG PO TABS
0.5000 mg | ORAL_TABLET | Freq: Every day | ORAL | Status: DC | PRN
  Administered 2019-03-03 – 2019-03-04 (×2): 0.5 mg via ORAL
  Filled 2019-03-03 (×2): qty 1

## 2019-03-03 MED ORDER — SODIUM CHLORIDE 0.9 % IV BOLUS
1000.0000 mL | Freq: Once | INTRAVENOUS | Status: AC
  Administered 2019-03-03: 1000 mL via INTRAVENOUS

## 2019-03-03 NOTE — ED Triage Notes (Signed)
Pt arrives via ems with generalized weakness X2 weeks significantly weaker today. Mild SOB. VSS per EMS. 101/51 after 500cc bolus. HR 100 Sinus with bigeminal pac's non perfusing. CBG 230 no hx diabetes. 95% RA. R sided deficits from previous CVA.

## 2019-03-03 NOTE — Progress Notes (Signed)
Pt admitted to 5W 09 from ED pt alert and oriented x4, skin clean dry and intact with no evidence of skin breakdown. Pt has first of 2 units of blood running on arrival to floor, started by ED RN. Pt oriented to room, bed placed in lowest position and call light and phone in reach. Will continue to monitor

## 2019-03-03 NOTE — ED Provider Notes (Signed)
[facility] DEPARTMENT Provider Note   CSN: [id number] Arrival date & time: 03/03/19  1246    History   Chief Complaint Chief Complaint  Patient presents with  . Weakness    HPI [name] is a 68 y.o. female.     Pt presents to the ED today with generalized weakness.  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  She feels like her hemoglobin is low.  She did recently receive 2 doses of Rituxan (May) and an iron infusion in June.  She called her hematologist, but did not receive a call back, so she called EMS.  The pt denies f/c.  No cough.  DOE.     Past Medical History:  Diagnosis Date  . Allergic rhinitis   . Anemia    recurrent iron defic.  Marland Kitchen Cervical polyp 03/2003  . DDD (degenerative disc disease)   . Depression    Dr. Toy Care (situational, divorce, loss of parents)  . Diverticulosis   . DVT (deep venous thrombosis) (HCC)    both legs  . Esophageal stricture   . GERD (gastroesophageal reflux disease)   . Hiatal hernia   . Hyperlipidemia   . Hypertension   . Hypertension   . Neuropathy, peripheral   . OSA (obstructive sleep apnea)   . Osteoarthritis   . Pulmonary embolism ([place])    left lung  . Wegner's disease (congenital syphilitic osteochondritis) 2012   DR. Ginger Organ at [facility]    Patient Active Problem List   Diagnosis Date Noted  . Lacunar infarct, acute ([place]) 11/05/2018  . Acute CVA (cerebrovascular accident) ([place]) 11/03/2018  . Ulcer of lower limb ([place]) 01/02/2014  . Chronic venous insufficiency 01/02/2014  . 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 Varicose veins of both lower extremities with complications 74/25/9563  . 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 Alveolar pneumopathy ([place]) 02/11/2011  . Lung mass 12/06/2010  . HEADACHE 08/24/2010  . RASH-NONVESICULAR 08/23/2010  . CELLULITIS 08/09/2010  . ANEMIA-IRON  DEFICIENCY 02/16/2010  . CARDIAC MURMUR, AORTIC 01/10/2010  . POSTMENOPAUSAL SYNDROME 03/09/2009  . Hyperlipemia 12/07/2008  . PERNICIOUS ANEMIA 12/07/2008  . DEGENERATIVE DISC DISEASE, LUMBOSACRAL SPINE W/RADICULOPATHY 12/07/2008  . FASTING HYPERGLYCEMIA 12/07/2008  . IRON DEFICIENCY ANEMIA SECONDARY TO BLOOD LOSS 02/14/2008  . Essential hypertension 01/01/2008  . ALLERGIC RHINITIS 12/03/2007  . GERD 12/03/2007  . PAIN IN JOINT, SITE UNSPECIFIED 12/03/2007  . SLEEP APNEA 12/03/2007  . HIATAL HERNIA 02/23/2006  . Diverticulosis of colon (without mention of hemorrhage) 12/30/2002    Past Surgical History:  Procedure Laterality Date  . back injection     steroid injection x2  . BRONCHOSCOPY  april 2012  . iron infusion  11/14   seeing hematologist at [facility]  . lung biospy  may 2012  . PARTIAL HIP ARTHROPLASTY Left 03/2014     OB History    Gravida  0   Para  0   Term  0   Preterm  0   AB  0   Living  0     SAB  0   TAB  0   Ectopic  0   Multiple  0   Live Births               Home Medications    Prior to Admission medications   Medication Sig Start Date End Date Taking? Authorizing Provider  acetaminophen (TYLENOL) 325  MG tablet Take 650 mg by mouth as needed for mild pain.    Yes [provider]  apixaban (ELIQUIS) 5 MG TABS tablet Take 1 tablet (5 mg total) by mouth 2 (two) times daily. 11/08/18  Yes Angiulli, Lavon Paganini, PA-C  aspirin EC 81 MG EC tablet Take 1 tablet (81 mg total) by mouth daily. 11/06/18  Yes Kayleen Memos, DO  atorvastatin (LIPITOR) 40 MG tablet Take 1 tablet (40 mg total) by mouth daily at 6 PM. 11/08/18  Yes Angiulli, Lavon Paganini, PA-C  azaTHIOprine (IMURAN) 50 MG tablet Take 100 mg by mouth daily. 06/14/18 06/14/19 Yes [provider]  calcium carbonate (OS-CAL) 600 MG TABS tablet Take 600 mg by mouth daily.    Yes [provider]  Cholecalciferol (VITAMIN D PO) Take 1 tablet by mouth 2 (two) times daily.     Yes [provider]  DULoxetine (CYMBALTA) 60 MG capsule Take 1 capsule (60 mg total) by mouth at bedtime. 11/08/18  Yes Angiulli, Lavon Paganini, PA-C  fluticasone (FLONASE) 50 MCG/ACT nasal spray Place 1 spray into both nostrils 2 (two) times daily as needed for congestion. 12/24/18  Yes [provider]  gabapentin (NEURONTIN) 600 MG tablet Take 1 tablet (600 mg total) by mouth 2 (two) times daily. 11/08/18  Yes Angiulli, Lavon Paganini, PA-C  hydrochlorothiazide (MICROZIDE) 12.5 MG capsule Take 1 capsule by mouth daily. 11/28/18  Yes [provider]  hydroxyurea (HYDREA) 500 MG capsule Take 500-1,000 mg by mouth See admin instructions. 1000mg  in the morning and 500mg  at night 07/14/13  Yes [provider]  ibandronate (BONIVA) 150 MG tablet Take 150 mg by mouth every 30 (thirty) days.    Yes [provider]  lisinopril (PRINIVIL,ZESTRIL) 10 MG tablet Take 1 tablet (10 mg total) by mouth daily. Patient taking differently: Take 20 mg by mouth daily.  11/08/18  Yes Angiulli, Lavon Paganini, PA-C  LORazepam (ATIVAN) 0.5 MG tablet Take 1 tablet (0.5 mg total) by mouth daily as needed for anxiety. 11/08/18  Yes Angiulli, Lavon Paganini, PA-C  Multiple Vitamin (MULTIVITAMIN) tablet Take 1 tablet by mouth daily.     Yes [provider]  oxyCODONE-acetaminophen (PERCOCET/ROXICET) 5-325 MG tablet Take 1 tablet by mouth every 6 (six) hours as needed. 11/08/18  Yes Angiulli, Lavon Paganini, PA-C  predniSONE (DELTASONE) 5 MG tablet Take 5 mg by mouth daily. 07/15/18  Yes [provider]    Family History Family History  Problem Relation Age of Onset  . Hypertension Mother   . Neuropathy Mother   . Varicose Veins Mother   . Stroke Father   . Lung cancer Father   . Arthritis Father   . Cancer Father   . Deep vein thrombosis Father   . Hypertension Father   . Varicose Veins Father   . Diabetes Maternal Grandfather   . Diabetes Maternal Uncle   . Diabetes Maternal Aunt   .  Hypertension Sister   . Colon cancer Neg Hx     Social History Social History   Tobacco Use  . Smoking status: Never Smoker  . Smokeless tobacco: Never Used  Substance Use Topics  . Alcohol use: No    Comment: rare  . Drug use: No     Allergies   Latex, Other, Pregabalin, and Codeine   Review of Systems Review of Systems  Neurological: Positive for weakness.  All other systems reviewed and are negative.    Physical Exam Updated Vital Signs BP Marland Kitchen)  111/54   Pulse (!) 48   Temp 98.7 F (37.1 C) (Oral)   Resp 16   Ht 5\' 3"  (1.6 m)   Wt 77.1 kg   LMP 12/20/2002   SpO2 93%   BMI 30.11 kg/m   Physical Exam Vitals signs and nursing note reviewed.  Constitutional:      Appearance: Normal appearance.  HENT:     Head: Normocephalic and atraumatic.     Right Ear: External ear normal.     Left Ear: External ear normal.     Nose: Nose normal.     Mouth/Throat:     Mouth: Mucous membranes are moist.     Pharynx: Oropharynx is clear.  Eyes:     Extraocular Movements: Extraocular movements intact.     Conjunctiva/sclera: Conjunctivae normal.     Pupils: Pupils are equal, round, and reactive to light.  Neck:     Musculoskeletal: Normal range of motion and neck supple.  Cardiovascular:     Rate and Rhythm: Normal rate and regular rhythm.     Pulses: Normal pulses.     Heart sounds: Normal heart sounds.  Pulmonary:     Effort: Pulmonary effort is normal.     Breath sounds: Normal breath sounds.  Abdominal:     General: Abdomen is flat. Bowel sounds are normal.     Palpations: Abdomen is soft.  Genitourinary:    Rectum: Guaiac result positive.     Comments: Black stool Musculoskeletal: Normal range of motion.  Skin:    General: Skin is warm.     Capillary Refill: Capillary refill takes less than 2 seconds.  Neurological:     General: No focal deficit present.     Mental Status: She is alert and oriented to person, place, and time.  Psychiatric:        Mood  and Affect: Mood normal.        Behavior: Behavior normal.      ED Treatments / Results  Labs (all labs ordered are listed, but only abnormal results are displayed) Labs Reviewed  COMPREHENSIVE METABOLIC PANEL - Abnormal; Notable for the following components:      Result Value   Glucose, Bld 109 (*)    BUN 34 (*)    Calcium 8.8 (*)    Total Protein 5.6 (*)    Albumin 3.0 (*)    Alkaline Phosphatase 37 (*)    All other components within normal limits  CBC WITH DIFFERENTIAL/PLATELET - Abnormal; Notable for the following components:   RBC 1.27 (*)    Hemoglobin 4.0 (*)    HCT 13.6 (*)    MCV 107.1 (*)    MCHC 29.4 (*)    RDW 16.6 (*)    nRBC 2.3 (*)    Lymphs Abs 0.5 (*)    All other components within normal limits  URINALYSIS, ROUTINE W REFLEX MICROSCOPIC - Abnormal; Notable for the following components:   Leukocytes,Ua TRACE (*)    All other components within normal limits  FERRITIN - Abnormal; Notable for the following components:   Ferritin 6 (*)    All other components within normal limits  POC OCCULT BLOOD, ED - Abnormal; Notable for the following components:   Fecal Occult Bld POSITIVE (*)    All other components within normal limits  SARS CORONAVIRUS 2 (HOSPITAL ORDER, [facility])  TYPE AND SCREEN  PREPARE RBC (CROSSMATCH)    EKG EKG Interpretation  Date/Time:  Monday March 03 2019  12:55:31 EDT Ventricular Rate:  99 PR Interval:  136 QRS Duration: 68 QT Interval:  348 QTC Calculation: 446 R Axis:     Text Interpretation:  Sinus rhythm with Premature atrial complexes in a pattern of bigeminy Otherwise normal ECG pacs are new Confirmed by Isla Pence [phone]) on 03/03/2019 1:03:04 PM Also confirmed by Isla Pence [phone]), editor Philomena Doheny [phone])  on 03/03/2019 1:54:25 PM   Radiology No results found.  Procedures Procedures (including critical care time)  Medications Ordered in ED Medications  sodium chloride 0.9 %  bolus 1,000 mL (has no administration in time range)  0.9 %  sodium chloride infusion (Manually program via Guardrails IV Fluids) (has no administration in time range)  pantoprazole (PROTONIX) 80 mg in sodium chloride 0.9 % 100 mL IVPB (has no administration in time range)  pantoprazole (PROTONIX) 80 mg in sodium chloride 0.9 % 250 mL (0.32 mg/mL) infusion (has no administration in time range)     Initial Impression / Assessment and Plan / ED Course  I have reviewed the triage vital signs and the nursing notes.  Pertinent labs & imaging results that were available during my care of the patient were reviewed by me and considered in my medical decision making (see chart for details).     Pt is on Eliquis and ASA.  Stool is +.  Pt started on protonix bolus and drip.  2 units of prbcs to be transfused.  The pt was d/w [facility] who will see pt.  Pt  D/w Dr. Jamse Arn (triad) who will admit.  covid swab is pending at admission.  CRITICAL CARE Performed by: Isla Pence   Total critical care time: 30 minutes  Critical care time was exclusive of separately billable procedures and treating other patients.  Critical care was necessary to treat or prevent imminent or life-threatening deterioration.  Critical care was time spent personally by me on the following activities: development of treatment plan with patient and/or surrogate as well as nursing, discussions with consultants, evaluation of patient's response to treatment, examination of patient, obtaining history from patient or surrogate, ordering and performing treatments and interventions, ordering and review of laboratory studies, ordering and review of radiographic studies, pulse oximetry and re-evaluation of patient's condition.  Lamika Connolly was evaluated in Emergency Department on 03/03/2019 for the symptoms described in the history of present illness. She was evaluated in the context of the global COVID-19 pandemic, which  necessitated consideration that the patient might be at risk for infection with the SARS-CoV-2 virus that causes COVID-19. Institutional protocols and algorithms that pertain to the evaluation of patients at risk for COVID-19 are in a state of rapid change based on information released by regulatory bodies including the CDC and federal and state organizations. These policies and algorithms were followed during the patient's care in the ED.   Final Clinical Impressions(s) / ED Diagnoses   Final diagnoses:  UGI bleed  Symptomatic anemia    ED Discharge Orders    None       Isla Pence, MD 03/03/19 1454

## 2019-03-03 NOTE — Consult Note (Addendum)
[facility] Consult: 2:24 PM 03/03/2019  LOS: 0 days    Referring Provider: Dr Gilford Raid in ED  Primary Care Physician:  Crist Infante, MD Primary Gastroenterologist:  Dr. Henrene Pastor >> Dr Ginnie Smart at [facility]   Hematologist: At [facility], Dr. Eston Mould. Also has a rheumatologist at [facility]   Reason for Consultation:  Hgb 4.  Tarry stool   HPI: [name] is a 68 y.o. female.  PMH iron deficiency anemia.  Myelodysplastic disorder.  Thrombocytosis.  Wegener's granulomatosis with polyangiitis, management with Rituxan.  Last infusion 01/07/2019.  DVT, PE, PA on chronic Eliquis, s/p IVC filter..  CVA.  Htn. OSA.  Osteoporosis.  02/2016 EGD and colonoscopy at [facility].  Unable to locate exact report but there is notation of "no polyps removed", "terminal ileum photo was captured" "repeat colonoscopy in 10 years" Pathology of duodenum shows no pathology, preserved villous architecture, no increased intraepithelial lymphocytes. Dr. Henrene Pastor performed EGD 07/2013 for evaluation of IDA.  Noted incidental, benign, distal esophageal stricture.  Moderately large, sliding hiatal hernia with associated Cameron erosions. Colonoscopy by Dr. Henrene Pastor 10/2011 for evaluation FOBT positive.  Observed moderate sigmoid diverticulosis otherwise normal study.  Periodically receives parenteral iron at [facility], once or twice a year.  The last infusion was several months ago.  For the last week or so she has had intermittent issues with fatigue.  Yesterday was actually a good day for her, she got out and worked in her yard, watering her plants.  She felt tired at the end of the day but this was not profound.  This morning she got up ~8 AM.  She had a dark brown, darker than usual, soft stool.  Normally she has soft brown stools.  She felt weak, tired and  went back to bed without having breakfast.  When she woke up at about 10 AM, she still felt really weak and a bit dizzy.  She was at home with her husband and they called 911.  In the ED BP 99/47.  Heart rate 58. Hgb 4, MCV 107.  C/w 8.5, 113 on 01/21/19.   Ferritin 6.  No recent B12 or Folate results.   Stool FOBT positive. BUN is 34, in mid March it was 14.  Sinus bigeminy on EKG. 2 U PRBCs ordered.  Patient has not had much in the way of GI symptoms.  She had the dark stools and that said.  Her appetite is good.  No nausea, vomiting, abdominal pain.  No heartburn.  She is to take acid suppression medication but has not needed to take this in a long time.  No dysphagia.  No alcohol.  Takes baby aspirin but no other NSAIDs.  Last Eliquis dose was last night, 7/12.       Past Medical History:  Diagnosis Date  . Allergic rhinitis   . Anemia    recurrent iron defic.  Marland Kitchen Cervical polyp 03/2003  . DDD (degenerative disc disease)   . Depression    Dr. Toy Care (situational, divorce, loss of parents)  . Diverticulosis   .  DVT (deep venous thrombosis) (HCC)    both legs  . Esophageal stricture   . GERD (gastroesophageal reflux disease)   . Hiatal hernia   . Hyperlipidemia   . Hypertension   . Hypertension   . Neuropathy, peripheral   . OSA (obstructive sleep apnea)   . Osteoarthritis   . Pulmonary embolism ([place])    left lung  . Wegner's disease (congenital syphilitic osteochondritis) 2012   DR. Ginger Organ at [facility]    Past Surgical History:  Procedure Laterality Date  . back injection     steroid injection x2  . BRONCHOSCOPY  april 2012  . iron infusion  11/14   seeing hematologist at [facility]  . lung biospy  may 2012  . PARTIAL HIP ARTHROPLASTY Left 03/2014    Prior to Admission medications   Medication Sig Start Date End Date Taking? Authorizing Provider  acetaminophen (TYLENOL) 325 MG tablet Take 650 mg by mouth as needed for mild pain.    Yes [provider]  apixaban  (ELIQUIS) 5 MG TABS tablet Take 1 tablet (5 mg total) by mouth 2 (two) times daily. 11/08/18  Yes Angiulli, Lavon Paganini, PA-C  aspirin EC 81 MG EC tablet Take 1 tablet (81 mg total) by mouth daily. 11/06/18  Yes Kayleen Memos, DO  atorvastatin (LIPITOR) 40 MG tablet Take 1 tablet (40 mg total) by mouth daily at 6 PM. 11/08/18  Yes Angiulli, Lavon Paganini, PA-C  azaTHIOprine (IMURAN) 50 MG tablet Take 100 mg by mouth daily. 06/14/18 06/14/19 Yes [provider]  calcium carbonate (OS-CAL) 600 MG TABS tablet Take 600 mg by mouth daily.    Yes [provider]  Cholecalciferol (VITAMIN D PO) Take 1 tablet by mouth 2 (two) times daily.    Yes [provider]  DULoxetine (CYMBALTA) 60 MG capsule Take 1 capsule (60 mg total) by mouth at bedtime. 11/08/18  Yes Angiulli, Lavon Paganini, PA-C  fluticasone (FLONASE) 50 MCG/ACT nasal spray Place 1 spray into both nostrils 2 (two) times daily as needed for congestion. 12/24/18  Yes [provider]  gabapentin (NEURONTIN) 600 MG tablet Take 1 tablet (600 mg total) by mouth 2 (two) times daily. 11/08/18  Yes Angiulli, Lavon Paganini, PA-C  hydrochlorothiazide (MICROZIDE) 12.5 MG capsule Take 1 capsule by mouth daily. 11/28/18  Yes [provider]  hydroxyurea (HYDREA) 500 MG capsule Take 500-1,000 mg by mouth See admin instructions. 1000mg  in the morning and 500mg  at night 07/14/13  Yes [provider]  ibandronate (BONIVA) 150 MG tablet Take 150 mg by mouth every 30 (thirty) days.    Yes [provider]  lisinopril (PRINIVIL,ZESTRIL) 10 MG tablet Take 1 tablet (10 mg total) by mouth daily. Patient taking differently: Take 20 mg by mouth daily.  11/08/18  Yes Angiulli, Lavon Paganini, PA-C  LORazepam (ATIVAN) 0.5 MG tablet Take 1 tablet (0.5 mg total) by mouth daily as needed for anxiety. 11/08/18  Yes Angiulli, Lavon Paganini, PA-C  Multiple Vitamin (MULTIVITAMIN) tablet Take 1 tablet by mouth daily.     Yes [provider]   oxyCODONE-acetaminophen (PERCOCET/ROXICET) 5-325 MG tablet Take 1 tablet by mouth every 6 (six) hours as needed. 11/08/18  Yes Angiulli, Lavon Paganini, PA-C  predniSONE (DELTASONE) 5 MG tablet Take 5 mg by mouth daily. 07/15/18  Yes [provider]    Scheduled Meds: . sodium chloride   Intravenous Once   Infusions: . pantoprazole (PROTONIX) IVPB    . pantoprozole (PROTONIX) infusion    .  sodium chloride     PRN Meds:  Social History   Social History Narrative   Disabled NICU RN   Married   Rare EtOH   Non-smoker and no drugs      Allergies as of 03/03/2019 - Review Complete 03/03/2019  Allergen Reaction Noted  . Latex Rash 12/06/2010  . Other Itching 02/06/2011  . Pregabalin Swelling 10/29/2014  . Codeine Itching 07/22/2018    Family History  Problem Relation Age of Onset  . Hypertension Mother   . Neuropathy Mother   . Varicose Veins Mother   . Stroke Father   . Lung cancer Father   . Arthritis Father   . Cancer Father   . Deep vein thrombosis Father   . Hypertension Father   . Varicose Veins Father   . Diabetes Maternal Grandfather   . Diabetes Maternal Uncle   . Diabetes Maternal Aunt   . Hypertension Sister   . Colon cancer Neg Hx       REVIEW OF SYSTEMS: Constitutional: See HPI. ENT:  No nose bleeds Pulm: Somewhat short of breath but it is the fatigue which is the most prominent symptom.  Occasional cough, nonproductive. CV:  No palpitations, no LE edema.  No angina. GU:  No hematuria, no frequency GI: See HPI. Heme: Denies excessive or unusual bleeding or bruising. Transfusions: 1 PRBC in 2012, 2 PRBCs in the outpatient day hospital in 2014.  None since then Neuro:  No headaches, no peripheral tingling or numbness Derm:  No itching, no rash or sores.  Endocrine:  No sweats or chills.  No polyuria or dysuria Immunization: Not queried regarding vaccination history Travel:  None beyond local counties in last few months.    PHYSICAL  EXAM: Vital signs in last 24 hours: Vitals:   03/03/19 1255  BP: (!) 111/54  Pulse: (!) 48  Resp: 16  Temp: 98.7 F (37.1 C)  SpO2: 93%   Wt Readings from Last 3 Encounters:  03/03/19 77.1 kg  02/25/19 78 kg  01/16/19 78 kg    General: Pale, alert, moderately unwell but not acutely ill-appearing WF.  Overweight. Head: No facial asymmetry or swelling.  No head trauma Eyes: Conjunctiva pale.  EOMI. Ears: Not hard of hearing Nose: No discharge, no congestion. Mouth: Moist, clear, pink oral mucosa.  Tongue midline.  Good dentition. Neck: No JVD, no masses, no thyromegaly Lungs: CTA bilaterally.  No labored breathing.  No cough. Heart: Regularly irregular.  Sinus bigeminy on EKG monitor. Abdomen: Soft, obese.  Not tender or distended.  Active bowel sounds.  No HSM, masses, bruits, hernias..   Rectal: deferred rectal exam   Musc/Skeltl: No joint redness, swelling or significant deformity other than some mild kyphosis. Extremities: No CCE.  Feet are warm with good, brisk perfusion. Neurologic: Alert.  Oriented x3.  No tremor, no gross weakness or deficits.  Moves all 4 limbs. Skin: No telangiectasia, no rash, no sores, no significant purpura/bruising. Tattoos: None Nodes: No cervical adenopathy Psych: Pleasant, calm, cooperative, affect subdued.   LAB RESULTS: Recent Labs    03/03/19 1259  WBC 5.7  HGB 4.0*  HCT 13.6*  PLT 373   BMET Lab Results  Component Value Date   NA 140 03/03/2019   NA 141 11/06/2018   NA 142 11/05/2018   K 3.8 03/03/2019   K 3.6 11/06/2018   K 3.9 11/05/2018   CL 107 03/03/2019   CL 106 11/06/2018   CL 107 11/05/2018   CO2 25 03/03/2019  CO2 29 11/06/2018   CO2 28 11/05/2018   GLUCOSE 109 (H) 03/03/2019   GLUCOSE 94 11/06/2018   GLUCOSE 91 11/05/2018   BUN 34 (H) 03/03/2019   BUN 14 11/06/2018   BUN 13 11/05/2018   CREATININE 0.79 03/03/2019   CREATININE 0.68 11/06/2018   CREATININE 0.68 11/05/2018   CALCIUM 8.8 (L) 03/03/2019    CALCIUM 9.5 11/06/2018   CALCIUM 9.4 11/05/2018   LFT Recent Labs    03/03/19 1259  PROT 5.6*  ALBUMIN 3.0*  AST 15  ALT 10  ALKPHOS 37*  BILITOT 0.4        IMPRESSION:   *   Macrocytic anemia.  Low ferritin.  Profound.  Symptomatic. Wonder if she has bleeding from [place] erosions vs ulcers vs telangectasia.   Chronic baseline anemia with IDA, periodic iron infusions at [facility].  Followed by heme there for myeolodysplastic disorder.       *   Chronic Eliquis, last dose was PM of 7/12.  For hx dvt, PE and a fib  *     Wegener's granulomatosis.  PLAN:     *   EGD tomorrow set for 10:30 in the morning. Clears today.  PPI gtt not yet started, will d/c drip replace with Protonix 40 mg IB BID.    *   Covid 19 in house test collected and processing.   Hgb/crit at 8 PM.  Call attending if Hgb <7, as would need additional PRBC.   CBC in AM.     Azucena Freed  03/03/2019, 2:24 PM Phone [phone]    [facility] Attending   I have taken an interval history, reviewed the chart and examined the patient. I agree with the Advanced Practitioner's note, impression and recommendations.     Dark Heme +stool acute on chronic anemia  Hiatal Hernia with Cameron's erosions Eliquis ASA Boniva No PPI at home   UGI bleed likely  EGD tomorrow - off Eliquis  Gatha Mayer, MD, [facility] 03/03/2019 4:42 PM Pager [phone]

## 2019-03-03 NOTE — H&P (Signed)
History and Physical:    [name]   IWP:[id number] DOB: [birth year] DOA: 03/03/2019  Referring MD/provider:  PCP: Crist Infante, MD   Patient coming from: Home  Chief Complaint: Weakness and fatigue for 2 weeks  History of Present Illness:   [name] is an 68 y.o. female with past medical history significant for hypertension, GERD, Wegener's granulomatosis on Imuran, prednisone 5 and hydroxyurea daily, history of VTE history of guaiac positive stools with last colonoscopy 2017 without polyps was in her usual state of health until 2 weeks prior to admission when she noted that she was feeling more tired than usual with less exertion.  Over the past 2 weeks this feeling persisted and worsened however she notes that she had good days and bad days.  For example yesterday patient was able to do laundry and work in the garden for a little bit without feeling too poorly.  This morning patient said that she was so weak she had a hard time getting out of bed she was dizzy and felt that "I just could not go on like this".  She also did note that she had dark stools this morning.  Other than fatigue and weakness over the past 2 weeks patient denies any red stools, hematemesis, hemoptysis.  She does have fatigue with exertion but does not necessarily think it is shortness of breath per se.  She denies any chest pain or palpitations.  No pleuritic chest pain.  She does continue to take Eliquis and aspirin and last took her Eliquis yesterday evening.  Patient denies fevers or chills.  No cough.  No abdominal pain.  Patient does admit to headache which she attributes to not being able to eat all day.  ED Course:  The patient was noted to have relatively low blood pressure and H&H returned at 4.0/13.  Patient was noted to have markedly guaiac positive stools which were dark.  [facility] consulted and they are scheduling her for an EGD in the morning.  She is started on Protonix IV twice  daily.  She is getting transfused 2 units PRBCs.  ROS:   ROS   Review of Systems: General: No fever, chills, weight changes Skin: No rashes, lesions, wounds Endocrine: no heat/cold intolerance, no polyuria Respiratory: No cough,, shortness of breath, hemoptysis Cardiovascular: No palpitations, chest pain GI: No nausea, vomiting, diarrhea, constipation GU: No dysuria, increased frequency   Past Medical History:   Past Medical History:  Diagnosis Date  . Allergic rhinitis   . Anemia    recurrent iron defic.  Marland Kitchen Cervical polyp 03/2003  . DDD (degenerative disc disease)   . Depression    Dr. Toy Care (situational, divorce, loss of parents)  . Diverticulosis   . DVT (deep venous thrombosis) (HCC)    both legs  . Esophageal stricture   . GERD (gastroesophageal reflux disease)   . Hiatal hernia   . Hyperlipidemia   . Hypertension   . Hypertension   . Neuropathy, peripheral   . OSA (obstructive sleep apnea)   . Osteoarthritis   . Pulmonary embolism ([place])    left lung  . Wegner's disease (congenital syphilitic osteochondritis) 2012   DR. Ginger Organ at [facility]    Past Surgical History:   Past Surgical History:  Procedure Laterality Date  . back injection     steroid injection x2  . BRONCHOSCOPY  april 2012  . iron infusion  11/14   seeing hematologist at [facility]  .  lung biospy  may 2012  . PARTIAL HIP ARTHROPLASTY Left 03/2014    Social History:   Social History   Socioeconomic History  . Marital status: Married    Spouse name: Not on file  . Number of children: N  . Years of education: Not on file  . Highest education level: Not on file  Occupational History  . 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 Financial resource strain: Not on file  . Food insecurity    Worry: Not on file    Inability: Not on file  . Transportation needs    Medical: Not on file    Non-medical: Not on file  Tobacco  Use  . Smoking status: Never Smoker  . Smokeless tobacco: Never Used  Substance and Sexual Activity  . Alcohol use: No    Comment: rare  . Drug use: No  . Sexual activity: Yes    Partners: Male    Birth control/protection: Post-menopausal  Lifestyle  . Physical activity    Days per week: Not on file    Minutes per session: Not on file  . Stress: Not on file  Relationships  . Social Herbalist on phone: Not on file    Gets together: Not on file    Attends religious service: Not on file    Active member of club or organization: Not on file    Attends meetings of clubs or organizations: Not on file    Relationship status: Not on file  . Intimate partner violence    Fear of current or ex partner: Not on file    Emotionally abused: Not on file    Physically abused: Not on file    Forced sexual activity: Not on file  Other Topics Concern  . Not on file  Social History Narrative  . Not on file    Allergies   Latex, Other, Pregabalin, and Codeine  Family history:   Family History  Problem Relation Age of Onset  . Hypertension Mother   . Neuropathy Mother   . Varicose Veins Mother   . Stroke Father   . Lung cancer Father   . Arthritis Father   . Cancer Father   . Deep vein thrombosis Father   . Hypertension Father   . Varicose Veins Father   . Diabetes Maternal Grandfather   . Diabetes Maternal Uncle   . Diabetes Maternal Aunt   . Hypertension Sister   . Colon cancer Neg Hx     Current Medications:   Prior to Admission medications   Medication Sig Start Date End Date Taking? Authorizing Provider  acetaminophen (TYLENOL) 325 MG tablet Take 650 mg by mouth as needed for mild pain.    Yes [provider]  apixaban (ELIQUIS) 5 MG TABS tablet Take 1 tablet (5 mg total) by mouth 2 (two) times daily. 11/08/18  Yes Angiulli, Lavon Paganini, PA-C  aspirin EC 81 MG EC tablet Take 1 tablet (81 mg total) by mouth daily. 11/06/18  Yes Kayleen Memos, DO   atorvastatin (LIPITOR) 40 MG tablet Take 1 tablet (40 mg total) by mouth daily at 6 PM. 11/08/18  Yes Angiulli, Lavon Paganini, PA-C  azaTHIOprine (IMURAN) 50 MG tablet Take 100 mg by mouth daily. 06/14/18 06/14/19 Yes [provider]  calcium carbonate (OS-CAL) 600 MG TABS tablet Take 600 mg by mouth daily.    Yes [provider]  Cholecalciferol (VITAMIN D PO) Take 1 tablet by mouth 2 (two) times daily.    Yes [provider]  DULoxetine (CYMBALTA) 60 MG capsule Take 1 capsule (60 mg total) by mouth at bedtime. 11/08/18  Yes Angiulli, Lavon Paganini, PA-C  fluticasone (FLONASE) 50 MCG/ACT nasal spray Place 1 spray into both nostrils 2 (two) times daily as needed for congestion. 12/24/18  Yes [provider]  gabapentin (NEURONTIN) 600 MG tablet Take 1 tablet (600 mg total) by mouth 2 (two) times daily. 11/08/18  Yes Angiulli, Lavon Paganini, PA-C  hydrochlorothiazide (MICROZIDE) 12.5 MG capsule Take 1 capsule by mouth daily. 11/28/18  Yes [provider]  hydroxyurea (HYDREA) 500 MG capsule Take 500-1,000 mg by mouth See admin instructions. 1000mg  in the morning and 500mg  at night 07/14/13  Yes [provider]  ibandronate (BONIVA) 150 MG tablet Take 150 mg by mouth every 30 (thirty) days.    Yes [provider]  lisinopril (PRINIVIL,ZESTRIL) 10 MG tablet Take 1 tablet (10 mg total) by mouth daily. Patient taking differently: Take 20 mg by mouth daily.  11/08/18  Yes Angiulli, Lavon Paganini, PA-C  LORazepam (ATIVAN) 0.5 MG tablet Take 1 tablet (0.5 mg total) by mouth daily as needed for anxiety. 11/08/18  Yes Angiulli, Lavon Paganini, PA-C  Multiple Vitamin (MULTIVITAMIN) tablet Take 1 tablet by mouth daily.     Yes [provider]  oxyCODONE-acetaminophen (PERCOCET/ROXICET) 5-325 MG tablet Take 1 tablet by mouth every 6 (six) hours as needed. 11/08/18  Yes Angiulli, Lavon Paganini, PA-C  predniSONE (DELTASONE) 5 MG tablet Take 5 mg by mouth daily. 07/15/18  Yes  [provider]    Physical Exam:   Vitals:   03/03/19 1252 03/03/19 1255  BP:  (!) 111/54  Pulse:  (!) 48  Resp:  16  Temp:  98.7 F (37.1 C)  TempSrc:  Oral  SpO2:  93%  Weight: 77.1 kg   Height: 5\' 3"  (1.6 m)      Physical Exam: Blood pressure (!) 111/54, pulse (!) 48, temperature 98.7 F (37.1 C), temperature source Oral, resp. rate 16, height 5\' 3"  (1.6 m), weight 77.1 kg, last menstrual period 12/20/2002, SpO2 93 %. Gen: Tired appearing female lying at 10 degrees in no acute respiratory distress. Eyes: Sclerae anicteric. Conjunctiva are quite pale. Chest: Moderately good air entry bilaterally with no adventitious sounds.  CV: Distant, regular, 2/6 systolic murmur left sternal border without radiation.   Abdomen: NABS, soft, nondistended, nontender. No tenderness to light or deep palpation. No rebound, no guarding. Extremities: Patient has compression hose on both legs. Neuro: Alert and oriented times 3; grossly nonfocal. Psych: Patient is cooperative, logical and coherent with appropriate mood and affect.  Data Review:    Labs: Basic Metabolic Panel: Recent Labs  Lab 03/03/19 1259  NA 140  K 3.8  CL 107  CO2 25  GLUCOSE 109*  BUN 34*  CREATININE 0.79  CALCIUM 8.8*   Liver Function Tests: Recent Labs  Lab 03/03/19 1259  AST 15  ALT 10  ALKPHOS 37*  BILITOT 0.4  PROT 5.6*  ALBUMIN 3.0*   No results for input(s): LIPASE, AMYLASE in the last 168 hours. No results for input(s): AMMONIA in the last 168 hours. CBC: Recent Labs  Lab 03/03/19 1259  WBC 5.7  NEUTROABS 4.8  HGB 4.0*  HCT 13.6*  MCV 107.1*  PLT 373   Cardiac Enzymes: No results for input(s): CKTOTAL, CKMB, CKMBINDEX, TROPONINI in the last  168 hours.  BNP (last 3 results) No results for input(s): PROBNP in the last 8760 hours. CBG: No results for input(s): GLUCAP in the last 168 hours.  Urinalysis    Component Value Date/Time   COLORURINE YELLOW 03/03/2019 1430    APPEARANCEUR CLEAR 03/03/2019 1430   LABSPEC 1.015 03/03/2019 1430   LABSPEC 1.015 01/03/2011 1602   PHURINE 6.0 03/03/2019 1430   GLUCOSEU NEGATIVE 03/03/2019 1430   HGBUR NEGATIVE 03/03/2019 1430   HGBUR negative 08/23/2010 1536   BILIRUBINUR NEGATIVE 03/03/2019 1430   BILIRUBINUR N 10/29/2014 1125   BILIRUBINUR Negative 01/03/2011 1602   KETONESUR NEGATIVE 03/03/2019 1430   PROTEINUR NEGATIVE 03/03/2019 1430   UROBILINOGEN negative 10/29/2014 1125   UROBILINOGEN 0.2 03/07/2011 2045   NITRITE NEGATIVE 03/03/2019 1430   LEUKOCYTESUR TRACE (A) 03/03/2019 1430   LEUKOCYTESUR Negative 01/03/2011 1602      Radiographic Studies: No results found.  EKG: Independently reviewed.  Patient is in atrial bigeminy at 99 with P waves with different morphologies.  No acute ST-T wave changes.   Assessment/Plan:   Active Problems:   Essential hypertension   Wegener's granulomatosis with vasculitis (HCC)   Chronic venous insufficiency   GI bleed   History of adverse effect of venous thromboembolism (VTE) prophylaxis   Cerebrovascular disease  68 year old female with Wegener's vasculitis, hypertension, history of VTE on Eliquis and aspirin presents with GI bleed and H&H of 4/13.  She has no chest pain.  EKG is without any acute ST-T wave changes.  Plan is for EGD in the morning, she is on Protonix IV twice daily.  GI BLEED Patient is hemodynamically stable with no evidence of cardiovascular or cerebrovascular dysfunction despite very low hemoglobin and hematocrit. Patient seen by [facility] today, EGD planned for the morning, clear liquids tonight She is getting transfused 2 units PRBC, will follow H&H every 8 hours, goal is for hemoglobin 7. Hold Eliquis and aspirin, patient's last dose of Eliquis was last night  HTN Hold HCTZ and lisinopril until patient is adequately intravascularly fluid repleted  WEGENERS VASCULITIS Continue hydroxyurea, azathioprine and prednisone 5 mg daily No  reason for stress dose steroids at this point  H/O VTE/PE Hold Eliquis and aspirin  ANXIETY AND DEPRESSION Continue duloxetine and lorazepam   Other information:   DVT prophylaxis: SCD  code Status: Full code. Family Communication: Patient states her husband knows she is here. Disposition Plan: Home Consults called: GI Admission status: Full  The medical decision making on this patient was of high complexity and the patient is at high risk for clinical deterioration, therefore this is a level 3 visit.   Dewaine Oats Tublu Axcel Horsch [facility]  If 7PM-7AM, please contact night-coverage [email or web address] Password [facility] 03/03/2019, 3:38 PM

## 2019-03-03 NOTE — H&P (View-Only) (Signed)
[facility] Consult: 2:24 PM 03/03/2019  LOS: 0 days    Referring Provider: Dr Gilford Raid in ED  Primary Care Physician:  Crist Infante, MD Primary Gastroenterologist:  Dr. Henrene Pastor >> Dr Ginnie Smart at [facility].   Hematologist: At [facility], Dr. Eston Mould. Also has a rheumatologist at [facility]   Reason for Consultation:  Hgb 4.  Tarry stool   HPI: [name] is a 68 y.o. female.  PMH iron deficiency anemia.  Myelodysplastic disorder.  Thrombocytosis.  Wegener's granulomatosis with polyangiitis, management with Rituxan.  Last infusion 01/07/2019.  DVT, PE, PA on chronic Eliquis, s/p IVC filter..  CVA.  Htn. OSA.  Osteoporosis.  02/2016 EGD and colonoscopy at [facility]  Unable to locate exact report but there is notation of "no polyps removed", "terminal ileum photo was captured" "repeat colonoscopy in 10 years" Pathology of duodenum shows no pathology, preserved villous architecture, no increased intraepithelial lymphocytes. Dr. Henrene Pastor performed EGD 07/2013 for evaluation of IDA.  Noted incidental, benign, distal esophageal stricture.  Moderately large, sliding hiatal hernia with associated Cameron erosions. Colonoscopy by Dr. Henrene Pastor 10/2011 for evaluation FOBT positive.  Observed moderate sigmoid diverticulosis otherwise normal study.  Periodically receives parenteral iron at [facility], once or twice a year.  The last infusion was several months ago.  For the last week or so she has had intermittent issues with fatigue.  Yesterday was actually a good day for her, she got out and worked in her yard, watering her plants.  She felt tired at the end of the day but this was not profound.  This morning she got up ~8 AM.  She had a dark brown, darker than usual, soft stool.  Normally she has soft brown stools.  She felt weak, tired and  went back to bed without having breakfast.  When she woke up at about 10 AM, she still felt really weak and a bit dizzy.  She was at home with her husband and they called 911.  In the ED BP 99/47.  Heart rate 58. Hgb 4, MCV 107.  C/w 8.5, 113 on 01/21/19.   Ferritin 6.  No recent B12 or Folate results.   Stool FOBT positive. BUN is 34, in mid March it was 14.  Sinus bigeminy on EKG. 2 U PRBCs ordered.  Patient has not had much in the way of GI symptoms.  She had the dark stools and that said.  Her appetite is good.  No nausea, vomiting, abdominal pain.  No heartburn.  She is to take acid suppression medication but has not needed to take this in a long time.  No dysphagia.  No alcohol.  Takes baby aspirin but no other NSAIDs.  Last Eliquis dose was last night, 7/12.       Past Medical History:  Diagnosis Date  . Allergic rhinitis   . Anemia    recurrent iron defic.  Marland Kitchen Cervical polyp 03/2003  . DDD (degenerative disc disease)   . Depression    Dr. Toy Care (situational, divorce, loss of parents)  . Diverticulosis   .  DVT (deep venous thrombosis) (HCC)    both legs  . Esophageal stricture   . GERD (gastroesophageal reflux disease)   . Hiatal hernia   . Hyperlipidemia   . Hypertension   . Hypertension   . Neuropathy, peripheral   . OSA (obstructive sleep apnea)   . Osteoarthritis   . Pulmonary embolism ([place])    left lung  . Wegner's disease (congenital syphilitic osteochondritis) 2012   DR. Ginger Organ at [facility]    Past Surgical History:  Procedure Laterality Date  . back injection     steroid injection x2  . BRONCHOSCOPY  april 2012  . iron infusion  11/14   seeing hematologist at [facility]  . lung biospy  may 2012  . PARTIAL HIP ARTHROPLASTY Left 03/2014    Prior to Admission medications   Medication Sig Start Date End Date Taking? Authorizing Provider  acetaminophen (TYLENOL) 325 MG tablet Take 650 mg by mouth as needed for mild pain.    Yes [provider]  apixaban  (ELIQUIS) 5 MG TABS tablet Take 1 tablet (5 mg total) by mouth 2 (two) times daily. 11/08/18  Yes Angiulli, Lavon Paganini, PA-C  aspirin EC 81 MG EC tablet Take 1 tablet (81 mg total) by mouth daily. 11/06/18  Yes Kayleen Memos, DO  atorvastatin (LIPITOR) 40 MG tablet Take 1 tablet (40 mg total) by mouth daily at 6 PM. 11/08/18  Yes Angiulli, Lavon Paganini, PA-C  azaTHIOprine (IMURAN) 50 MG tablet Take 100 mg by mouth daily. 06/14/18 06/14/19 Yes [provider]  calcium carbonate (OS-CAL) 600 MG TABS tablet Take 600 mg by mouth daily.    Yes [provider]  Cholecalciferol (VITAMIN D PO) Take 1 tablet by mouth 2 (two) times daily.    Yes [provider]  DULoxetine (CYMBALTA) 60 MG capsule Take 1 capsule (60 mg total) by mouth at bedtime. 11/08/18  Yes Angiulli, Lavon Paganini, PA-C  fluticasone (FLONASE) 50 MCG/ACT nasal spray Place 1 spray into both nostrils 2 (two) times daily as needed for congestion. 12/24/18  Yes [provider]  gabapentin (NEURONTIN) 600 MG tablet Take 1 tablet (600 mg total) by mouth 2 (two) times daily. 11/08/18  Yes Angiulli, Lavon Paganini, PA-C  hydrochlorothiazide (MICROZIDE) 12.5 MG capsule Take 1 capsule by mouth daily. 11/28/18  Yes [provider]  hydroxyurea (HYDREA) 500 MG capsule Take 500-1,000 mg by mouth See admin instructions. 1000mg  in the morning and 500mg  at night 07/14/13  Yes [provider]  ibandronate (BONIVA) 150 MG tablet Take 150 mg by mouth every 30 (thirty) days.    Yes [provider]  lisinopril (PRINIVIL,ZESTRIL) 10 MG tablet Take 1 tablet (10 mg total) by mouth daily. Patient taking differently: Take 20 mg by mouth daily.  11/08/18  Yes Angiulli, Lavon Paganini, PA-C  LORazepam (ATIVAN) 0.5 MG tablet Take 1 tablet (0.5 mg total) by mouth daily as needed for anxiety. 11/08/18  Yes Angiulli, Lavon Paganini, PA-C  Multiple Vitamin (MULTIVITAMIN) tablet Take 1 tablet by mouth daily.     Yes [provider]   oxyCODONE-acetaminophen (PERCOCET/ROXICET) 5-325 MG tablet Take 1 tablet by mouth every 6 (six) hours as needed. 11/08/18  Yes Angiulli, Lavon Paganini, PA-C  predniSONE (DELTASONE) 5 MG tablet Take 5 mg by mouth daily. 07/15/18  Yes [provider]    Scheduled Meds: . sodium chloride   Intravenous Once   Infusions: . pantoprazole (PROTONIX) IVPB    . pantoprozole (PROTONIX) infusion    .  sodium chloride     PRN Meds:  Social History   Social History Narrative   Disabled NICU RN   Married   Rare EtOH   Non-smoker and no drugs      Allergies as of 03/03/2019 - Review Complete 03/03/2019  Allergen Reaction Noted  . Latex Rash 12/06/2010  . Other Itching 02/06/2011  . Pregabalin Swelling 10/29/2014  . Codeine Itching 07/22/2018    Family History  Problem Relation Age of Onset  . Hypertension Mother   . Neuropathy Mother   . Varicose Veins Mother   . Stroke Father   . Lung cancer Father   . Arthritis Father   . Cancer Father   . Deep vein thrombosis Father   . Hypertension Father   . Varicose Veins Father   . Diabetes Maternal Grandfather   . Diabetes Maternal Uncle   . Diabetes Maternal Aunt   . Hypertension Sister   . Colon cancer Neg Hx       REVIEW OF SYSTEMS: Constitutional: See HPI. ENT:  No nose bleeds Pulm: Somewhat short of breath but it is the fatigue which is the most prominent symptom.  Occasional cough, nonproductive. CV:  No palpitations, no LE edema.  No angina. GU:  No hematuria, no frequency GI: See HPI. Heme: Denies excessive or unusual bleeding or bruising. Transfusions: 1 PRBC in 2012, 2 PRBCs in the outpatient day hospital in 2014.  None since then Neuro:  No headaches, no peripheral tingling or numbness Derm:  No itching, no rash or sores.  Endocrine:  No sweats or chills.  No polyuria or dysuria Immunization: Not queried regarding vaccination history Travel:  None beyond local counties in last few months.    PHYSICAL  EXAM: Vital signs in last 24 hours: Vitals:   03/03/19 1255  BP: (!) 111/54  Pulse: (!) 48  Resp: 16  Temp: 98.7 F (37.1 C)  SpO2: 93%   Wt Readings from Last 3 Encounters:  03/03/19 77.1 kg  02/25/19 78 kg  01/16/19 78 kg    General: Pale, alert, moderately unwell but not acutely ill-appearing WF.  Overweight. Head: No facial asymmetry or swelling.  No head trauma Eyes: Conjunctiva pale.  EOMI. Ears: Not hard of hearing Nose: No discharge, no congestion. Mouth: Moist, clear, pink oral mucosa.  Tongue midline.  Good dentition. Neck: No JVD, no masses, no thyromegaly Lungs: CTA bilaterally.  No labored breathing.  No cough. Heart: Regularly irregular.  Sinus bigeminy on EKG monitor. Abdomen: Soft, obese.  Not tender or distended.  Active bowel sounds.  No HSM, masses, bruits, hernias..   Rectal: deferred rectal exam   Musc/Skeltl: No joint redness, swelling or significant deformity other than some mild kyphosis. Extremities: No CCE.  Feet are warm with good, brisk perfusion. Neurologic: Alert.  Oriented x3.  No tremor, no gross weakness or deficits.  Moves all 4 limbs. Skin: No telangiectasia, no rash, no sores, no significant purpura/bruising. Tattoos: None Nodes: No cervical adenopathy Psych: Pleasant, calm, cooperative, affect subdued.   LAB RESULTS: Recent Labs    03/03/19 1259  WBC 5.7  HGB 4.0*  HCT 13.6*  PLT 373   BMET Lab Results  Component Value Date   NA 140 03/03/2019   NA 141 11/06/2018   NA 142 11/05/2018   K 3.8 03/03/2019   K 3.6 11/06/2018   K 3.9 11/05/2018   CL 107 03/03/2019   CL 106 11/06/2018   CL 107 11/05/2018   CO2 25 03/03/2019  CO2 29 11/06/2018   CO2 28 11/05/2018   GLUCOSE 109 (H) 03/03/2019   GLUCOSE 94 11/06/2018   GLUCOSE 91 11/05/2018   BUN 34 (H) 03/03/2019   BUN 14 11/06/2018   BUN 13 11/05/2018   CREATININE 0.79 03/03/2019   CREATININE 0.68 11/06/2018   CREATININE 0.68 11/05/2018   CALCIUM 8.8 (L) 03/03/2019    CALCIUM 9.5 11/06/2018   CALCIUM 9.4 11/05/2018   LFT Recent Labs    03/03/19 1259  PROT 5.6*  ALBUMIN 3.0*  AST 15  ALT 10  ALKPHOS 37*  BILITOT 0.4        IMPRESSION:   *   Macrocytic anemia.  Low ferritin.  Profound.  Symptomatic. Wonder if she has bleeding from [place] erosions vs ulcers vs telangectasia.   Chronic baseline anemia with IDA, periodic iron infusions at [facility].  Followed by heme there for myeolodysplastic disorder.       *   Chronic Eliquis, last dose was PM of 7/12.  For hx dvt, PE and a fib  *     Wegener's granulomatosis.  PLAN:     *   EGD tomorrow set for 10:30 in the morning. Clears today.  PPI gtt not yet started, will d/c drip replace with Protonix 40 mg IB BID.    *   Covid 19 in house test collected and processing.   Hgb/crit at 8 PM.  Call attending if Hgb <7, as would need additional PRBC.   CBC in AM.     Azucena Freed  03/03/2019, 2:24 PM Phone [phone]    [facility] Attending   I have taken an interval history, reviewed the chart and examined the patient. I agree with the Advanced Practitioner's note, impression and recommendations.     Dark Heme +stool acute on chronic anemia  Hiatal Hernia with Cameron's erosions Eliquis ASA Boniva No PPI at home   UGI bleed likely  EGD tomorrow - off Eliquis  Gatha Mayer, MD, [facility] 03/03/2019 4:42 PM Pager [phone]

## 2019-03-04 ENCOUNTER — Inpatient Hospital Stay (HOSPITAL_COMMUNITY): Payer: Medicare Other | Admitting: Anesthesiology

## 2019-03-04 ENCOUNTER — Encounter (HOSPITAL_COMMUNITY): Payer: Self-pay | Admitting: Anesthesiology

## 2019-03-04 ENCOUNTER — Encounter (HOSPITAL_COMMUNITY)
Admission: EM | Disposition: A | Discharge status: Home / Self Care | Payer: Self-pay | Source: Home / Self Care | Attending: Internal Medicine

## 2019-03-04 DIAGNOSIS — I679 Cerebrovascular disease, unspecified: Secondary | ICD-10-CM

## 2019-03-04 DIAGNOSIS — I872 Venous insufficiency (chronic) (peripheral): Secondary | ICD-10-CM

## 2019-03-04 HISTORY — PX: ESOPHAGOGASTRODUODENOSCOPY (EGD) WITH PROPOFOL: SHX5813

## 2019-03-04 HISTORY — PX: BIOPSY: SHX5522

## 2019-03-04 LAB — BASIC METABOLIC PANEL
Anion gap: 5 (ref 5–15)
BUN: 19 mg/dL (ref 8–23)
CO2: 27 mmol/L (ref 22–32)
Calcium: 8.4 mg/dL — ABNORMAL LOW (ref 8.9–10.3)
Chloride: 110 mmol/L (ref 98–111)
Creatinine, Ser: 0.66 mg/dL (ref 0.44–1.00)
GFR calc Af Amer: >60 mL/min (ref >60)
GFR calc non Af Amer: >60 mL/min (ref >60)
Glucose, Bld: 86 mg/dL (ref 70–99)
Potassium: 3.5 mmol/L (ref 3.5–5.1)
Sodium: 142 mmol/L (ref 135–145)

## 2019-03-04 LAB — PROTIME-INR
INR: 1.2 (ref 0.8–1.2)
Prothrombin Time: 14.6 seconds (ref 11.4–15.2)

## 2019-03-04 LAB — CBC
HCT: 18.6 % — ABNORMAL LOW (ref 36.0–46.0)
Hemoglobin: 6 g/dL — CL (ref 12.0–15.0)
MCH: 31.1 pg (ref 26.0–34.0)
MCHC: 32.3 g/dL (ref 30.0–36.0)
MCV: 96.4 fL (ref 80.0–100.0)
Platelets: 281 10*3/uL (ref 150–400)
RBC: 1.93 MIL/uL — ABNORMAL LOW (ref 3.87–5.11)
RDW: 18.1 % — ABNORMAL HIGH (ref 11.5–15.5)
WBC: 4.8 10*3/uL (ref 4.0–10.5)
nRBC: 3.1 % — ABNORMAL HIGH (ref 0.0–0.2)

## 2019-03-04 LAB — HEMOGLOBIN AND HEMATOCRIT, BLOOD
HCT: 28.8 % — ABNORMAL LOW (ref 36.0–46.0)
Hemoglobin: 9.5 g/dL — ABNORMAL LOW (ref 12.0–15.0)

## 2019-03-04 LAB — PREPARE RBC (CROSSMATCH)

## 2019-03-04 LAB — MRSA PCR SCREENING: MRSA by PCR: NEGATIVE

## 2019-03-04 SURGERY — ESOPHAGOGASTRODUODENOSCOPY (EGD) WITH PROPOFOL
Anesthesia: Monitor Anesthesia Care

## 2019-03-04 MED ORDER — PROPOFOL 10 MG/ML IV BOLUS
INTRAVENOUS | Status: DC | PRN
  Administered 2019-03-04 (×3): 10 mg via INTRAVENOUS

## 2019-03-04 MED ORDER — SODIUM CHLORIDE 0.9% IV SOLUTION
Freq: Once | INTRAVENOUS | Status: AC
  Administered 2019-03-04: 250 mL via INTRAVENOUS

## 2019-03-04 MED ORDER — SODIUM CHLORIDE 0.9 % IV SOLN
510.0000 mg | Freq: Once | INTRAVENOUS | Status: AC
  Administered 2019-03-04: 510 mg via INTRAVENOUS
  Filled 2019-03-04: qty 17

## 2019-03-04 MED ORDER — PROPOFOL 500 MG/50ML IV EMUL
INTRAVENOUS | Status: DC | PRN
  Administered 2019-03-04: 100 ug/kg/min via INTRAVENOUS

## 2019-03-04 SURGICAL SUPPLY — 15 items

## 2019-03-04 NOTE — Anesthesia Procedure Notes (Signed)
Procedure Name: MAC Date/Time: 03/04/2019 1:44 PM Performed by: Wilburn Cornelia, CRNA Pre-anesthesia Checklist: Emergency Drugs available, Suction available, Patient being monitored, Timeout performed and Patient identified Patient Re-evaluated:Patient Re-evaluated prior to induction Oxygen Delivery Method: Nasal cannula Placement Confirmation: positive ETCO2 and breath sounds checked- equal and bilateral Dental Injury: Teeth and Oropharynx as per pre-operative assessment

## 2019-03-04 NOTE — Progress Notes (Signed)
Writer offered to call patient's family for updates on her but she said she already talk with her family. Will continue to monitor.

## 2019-03-04 NOTE — Anesthesia Postprocedure Evaluation (Signed)
Anesthesia Post Note  Patient: [name]  Procedure(s) Performed: ESOPHAGOGASTRODUODENOSCOPY (EGD) WITH PROPOFOL (N/A ) BIOPSY     Patient location during evaluation: PACU Anesthesia Type: MAC Level of consciousness: awake and alert Pain management: pain level controlled Vital Signs Assessment: post-procedure vital signs reviewed and stable Respiratory status: spontaneous breathing, nonlabored ventilation, respiratory function stable and patient connected to nasal cannula oxygen Cardiovascular status: stable and blood pressure returned to baseline Postop Assessment: no apparent nausea or vomiting Anesthetic complications: no    Last Vitals:  Vitals:   03/04/19 1415 03/04/19 1455  BP: (!) 115/52 139/72  Pulse: 67 61  Resp: (!) 30 16  Temp:  37.1 C  SpO2: 100% 100%    Last Pain:  Vitals:   03/04/19 1414  TempSrc:   PainSc: 0-No pain                 Effie Berkshire

## 2019-03-04 NOTE — Progress Notes (Signed)
Patient back from Endo. Alert and oriented x 4; no acute distress noted, no complex; VS stable. Will continue to monitor.

## 2019-03-04 NOTE — Op Note (Addendum)
[facility] Patient Name: [name] Procedure Date : 03/04/2019 MRN: [id number] Attending MD: Gatha Mayer , MD Date of Birth: [birth year] CSN: [id number] Age: 68 Admit Type: Inpatient Procedure:                Upper GI endoscopy Indications:              Iron deficiency anemia secondary to chronic blood                            loss, Melena Providers:                Gatha Mayer, MD, Carlyn Reichert, RN, Cherylynn Ridges, Technician, Merrilyn Puma, CRNA Referring MD:              Medicines:                Propofol per Anesthesia, Monitored Anesthesia Care Complications:            No immediate complications. Estimated Blood Loss:     Estimated blood loss was minimal. Procedure:                Pre-Anesthesia Assessment:                           - Prior to the procedure, a History and Physical                            was performed, and patient medications and                            allergies were reviewed. The patient's tolerance of                            previous anesthesia was also reviewed. The risks                            and benefits of the procedure and the sedation                            options and risks were discussed with the patient.                            All questions were answered, and informed consent                            was obtained. Prior Anticoagulants: The patient                            last took Eliquis (apixaban) 1 day prior to the                            procedure. ASA Grade Assessment: III - A patient  with severe systemic disease. After reviewing the                            risks and benefits, the patient was deemed in                            satisfactory condition to undergo the procedure.                           After obtaining informed consent, the endoscope was                            passed under direct vision. Throughout the                             procedure, the patient's blood pressure, pulse, and                            oxygen saturations were monitored continuously. The                            GIF-H190 ([id number]) Olympus gastroscope was                            introduced through the mouth, and advanced to the                            second part of duodenum. The upper GI endoscopy was                            accomplished without difficulty. The patient                            tolerated the procedure well. Scope In: Scope Out: Findings:      One non-bleeding cratered gastric ulcer with a clean ulcer base (Forrest       Class III) was found in the gastric body. The lesion was 10 mm in       largest dimension. Biopsies were taken with a cold forceps for       histology. Verification of patient identification for the specimen was       done. Estimated blood loss was minimal.      A large paraesophageal hernia was found. The proximal extent of the       gastric folds (end of tubular esophagus) was 30 cm from the incisors.       The hiatal narrowing was difficult to estimate.      The exam was otherwise without abnormality.      Biopsies were taken with a cold forceps in the gastric antrum for       histology and Helicobacter pylori testing. Verification of patient       identification for the specimen was done. Estimated blood loss was       minimal. Impression:               - Non-bleeding gastric ulcer with a clean ulcer  base (Forrest Class III). Biopsied.                           - Large paraesophageal hernia.                           - The examination was otherwise normal.                           - Biopsies were taken with a cold forceps for                            histology and Helicobacter pylori testing. Recommendation:           - Return patient to hospital ward for ongoing care.                           - Resume regular diet.                           - BONIVA  AND ASA LIKELY REASONS FOR ULCER THOUGH                            COULD BE MECHANICAL DUE TO HIATAL HERNIA THAT SEEMS                            PARAESOPHAGEAL AND BE A CAMERON'S ULCER AS OPPOSED                            TO EROSIONS AS SEEN IN PAST                           TREAT WITH DAILY HIGH-DOSE PPI INDEFINITELY                           TREAT H PYLORI IF + (I WILL F/U BX - AND COORDINATE                            FURTHER F/U WITH DR. PERRY HER PRIMARY GI MD -                            THINK CHECKING FOR HEALING OF ULCER WILL BE WORTH                            IT)                           STAY OFF BONIVA AND NEED TO CONSIDER PARENTERAL TX                            INSTEAD OR NOT HAS OSTEOPENIA (ON STEROIDS)AND NOT                            OSTEOPOROSIS PER [facility] RECORDS  FERAHEME X 1 ORDERED AND REPEAT AS OUTPATIENT                           RESTART ELIQUIS IN 1 WEEK                           RESTART ASA TOMORROW (81 MG) HAD LACUNAR INFARCT                            MARCH (? FROM GRANULOMATOSIS)                           CONTINUE PREDNISONE                           HOME TOMORROW IF OK - LET us CHECK HER                           - Await pathology results. Procedure Code(s):        --- Professional ---                           [phone], Esophagogastroduodenoscopy, flexible,                            transoral; with biopsy, single or multiple Diagnosis Code(s):        --- Professional ---                           K25.9, Gastric ulcer, unspecified as acute or                            chronic, without hemorrhage or perforation                           K44.9, Diaphragmatic hernia without obstruction or                            gangrene                           D50.0, Iron deficiency anemia secondary to blood                            loss (chronic)                           K92.1, Melena (includes Hematochezia) CPT copyright 2019 American  Medical Association. All rights reserved. The codes documented in this report are preliminary and upon coder review may  be revised to meet current compliance requirements. Gatha Mayer, MD 03/04/2019 2:26:09 PM This report has been signed electronically. Number of Addenda: 0

## 2019-03-04 NOTE — Interval H&P Note (Signed)
History and Physical Interval Note:  03/04/2019 1:38 PM  [name]  has presented today for surgery, with the diagnosis of Hgb 4.  FOBT positive.   Eliquis patient.  The various methods of treatment have been discussed with the patient and family. After consideration of risks, benefits and other options for treatment, the patient has consented to  Procedure(s): ESOPHAGOGASTRODUODENOSCOPY (EGD) WITH PROPOFOL (N/A) as a surgical intervention.  The patient's history has been reviewed, patient examined, no change in status, stable for surgery.  I have reviewed the patient's chart and labs.  Questions were answered to the patient's satisfaction.     Silvano Rusk

## 2019-03-04 NOTE — Transfer of Care (Signed)
Immediate Anesthesia Transfer of Care Note  Patient: [name]  Procedure(s) Performed: ESOPHAGOGASTRODUODENOSCOPY (EGD) WITH PROPOFOL (N/A ) BIOPSY  Patient Location: Endoscopy Unit  Anesthesia Type:MAC  Level of Consciousness: awake, alert  and oriented  Airway & Oxygen Therapy: Patient Spontanous Breathing and Patient connected to nasal cannula oxygen  Post-op Assessment: Report given to RN and Post -op Vital signs reviewed and stable  Post vital signs: Reviewed and stable  Last Vitals:  Vitals Value Taken Time  BP    Temp    Pulse    Resp    SpO2      Last Pain:  Vitals:   03/04/19 1325  TempSrc: Oral  PainSc:          Complications: No apparent anesthesia complications

## 2019-03-04 NOTE — Anesthesia Preprocedure Evaluation (Addendum)
Anesthesia Evaluation  Patient identified by MRN, date of birth, ID band Patient awake    Reviewed: Allergy & Precautions, NPO status , Patient's Chart, lab work & pertinent test results  Airway Mallampati: III  TM Distance: >3 FB Neck ROM: Full    Dental  (+) Teeth Intact, Dental Advisory Given   Pulmonary sleep apnea ,    breath sounds clear to auscultation       Cardiovascular hypertension, Pt. on medications  Rhythm:Regular Rate:Normal     Neuro/Psych  Headaches, Depression CVA    GI/Hepatic hiatal hernia, GERD  ,  Endo/Other    Renal/GU      Musculoskeletal   Abdominal (+) + obese,   Peds  Hematology   Anesthesia Other Findings   Reproductive/Obstetrics                            Anesthesia Physical Anesthesia Plan  ASA: II  Anesthesia Plan: MAC   Post-op Pain Management:    Induction: Intravenous  PONV Risk Score and Plan: 0 and Propofol infusion  Airway Management Planned: Natural Airway and Nasal Cannula  Additional Equipment: None  Intra-op Plan:   Post-operative Plan:   Informed Consent:   Plan Discussed with: CRNA  Anesthesia Plan Comments:         Anesthesia Quick Evaluation

## 2019-03-04 NOTE — Progress Notes (Signed)
PROGRESS NOTE  [name] YYT:[id number] DOB: 1951-04-04 DOA: 03/03/2019 PCP: Crist Infante, MD  Brief History   [name] is an 68 y.o. female with past medical history significant for hypertension, GERD, Wegener's granulomatosis on Imuran, prednisone 5 and hydroxyurea daily, history of VTE history of guaiac positive stools with last colonoscopy 2017 without polyps was in her usual state of health until 2 weeks prior to admission when she noted that she was feeling more tired than usual with less exertion.  Over the past 2 weeks this feeling persisted and worsened however she notes that she had good days and bad days.  For example yesterday patient was able to do laundry and work in the garden for a little bit without feeling too poorly.  This morning patient said that she was so weak she had a hard time getting out of bed she was dizzy and felt that "I just could not go on like this".  She also did note that she had dark stools this morning.  Other than fatigue and weakness over the past 2 weeks patient denies any red stools, hematemesis, hemoptysis.  She does have fatigue with exertion but does not necessarily think it is shortness of breath per se.  She denies any chest pain or palpitations.  No pleuritic chest pain.  She does continue to take Eliquis and aspirin and last took her Eliquis yesterday evening.  Patient denies fevers or chills.  No cough.  No abdominal pain.  Patient does admit to headache which she attributes to not being able to eat all day.  ED Course:  The patient was noted to have relatively low blood pressure and H&H returned at 4.0/13.  Patient was noted to have markedly guaiac positive stools which were dark.  [facility] consulted and they are scheduling her for an EGD in the morning.  She is started on Protonix IV twice daily.  She is getting transfused 2 units PRBCs. Following the transfusion the patient's hemoglobin came up only to 6.0. She is receiving  another 2 units this morning. She is on a protonix gtt. GI has been consulted. She will go for an EGD this afternoon.  Consultants  . Gastroenterology  Procedures  . EGD  Antibiotics   Anti-infectives (From admission, onward)   None    .  Subjective  The patient is resting comfortably. No new complaints.  Objective   Vitals:  Vitals:   03/04/19 1253 03/04/19 1325  BP: (!) 144/64 (!) 145/58  Pulse:    Resp: (!) 28 (!) 28  Temp: 98.7 F (37.1 C) 98.5 F (36.9 C)  SpO2: 100% 100%    Exam:  Constitutional:  . The patient is awake, alert, and oriented x 3. No acute distress. Respiratory:  . There is no increased work of breathing. . No wheezes, rales, or rhonchi.  . No tactile fremitus. Cardiovascular:  . RRR, no m/r/g . No LE extremity edema   . Normal pedal pulses Abdomen:  . Abdomen is soft, non-tender, non-distended. . No hernias, masses, or organomegaly. . Hyperactive bowel sounds.  Musculoskeletal:  . No cyanosis, clubbing, or edema Skin:  . No rashes, lesions, ulcers . palpation of skin: no induration or nodules Neurologic:  . CN 2-12 intact . Sensation all 4 extremities intact Psychiatric:  . Mental status o Mood, affect appropriate o Orientation to person, place, time  . judgment and insight appear intact   I have personally reviewed the following:   Today's Data  .  CBC, BMP, INR, Vitals  Scheduled Meds: . [MAR Hold] sodium chloride   Intravenous Once  . [MAR Hold] atorvastatin  40 mg Oral q1800  . [MAR Hold] azaTHIOprine  100 mg Oral Q1200  . [MAR Hold] DULoxetine  60 mg Oral QHS  . [MAR Hold] gabapentin  600 mg Oral BID  . [MAR Hold] pantoprazole (PROTONIX) IV  40 mg Intravenous Q12H  . [MAR Hold] predniSONE  5 mg Oral Daily  . [MAR Hold] sodium chloride flush  3 mL Intravenous Q12H   Continuous Infusions: . [MAR Hold] sodium chloride      Active Problems:   Essential hypertension   Wegener's granulomatosis with vasculitis  (HCC)   Chronic venous insufficiency   GI bleed   History of adverse effect of venous thromboembolism (VTE) prophylaxis   Cerebrovascular disease   Melena   Acute blood loss anemia   Encounter for long-term use of antiplatelets/antithrombotics   LOS: 1 day   A & P  GI bleed: Patient is hemodynamically stable with no evidence of cardiovascular or cerebrovascular dysfunction despite very low hemoglobin and hematocrit. She has received 2 units in transfusion and another 2 units has now been ordered. Patient seen by [facility] today, EGD planned for later today. She is NPO after midnight.will follow H&H every 8 hours, goal is for hemoglobin 7.  Hold Eliquis and aspirin, patient's last dose of Eliquis was last night.  HTN: Hold HCTZ and lisinopril until patient is adequately intravascularly fluid repleted.  Wegeners Vasculitis: Continue hydroxyurea, azathioprine and prednisone 5 mg daily No reason for stress dose steroids at this point.  History of  VTE/PE: Eliquis and aspirin held due to GI bleed.  Anxiety and depression: Continue duloxetine and lorazepam.  Other information:   DVT prophylaxis: SCD  code Status: Full code. Family Communication: Patient states her husband knows she is here. Disposition Plan: Home  Capri Veals, DO [facility] Direct contact: see [email or web address]  7PM-7AM contact night coverage as above 03/04/2019, 2:17 PM  LOS: 1 day

## 2019-03-05 ENCOUNTER — Encounter (HOSPITAL_COMMUNITY): Payer: Self-pay | Admitting: Internal Medicine

## 2019-03-05 ENCOUNTER — Encounter: Payer: Self-pay | Admitting: Internal Medicine

## 2019-03-05 ENCOUNTER — Other Ambulatory Visit: Payer: Self-pay | Admitting: *Deleted

## 2019-03-05 DIAGNOSIS — D500 Iron deficiency anemia secondary to blood loss (chronic): Secondary | ICD-10-CM

## 2019-03-05 DIAGNOSIS — K254 Chronic or unspecified gastric ulcer with hemorrhage: Principal | ICD-10-CM

## 2019-03-05 DIAGNOSIS — K252 Acute gastric ulcer with both hemorrhage and perforation: Secondary | ICD-10-CM

## 2019-03-05 DIAGNOSIS — K449 Diaphragmatic hernia without obstruction or gangrene: Secondary | ICD-10-CM

## 2019-03-05 LAB — BPAM RBC
Blood Product Expiration Date: 202007192359
Blood Product Expiration Date: 202007192359
Blood Product Expiration Date: 202008112359
Blood Product Expiration Date: 202008112359
ISSUE DATE / TIME: 202007131557
ISSUE DATE / TIME: 202007132311
ISSUE DATE / TIME: 202007140914
ISSUE DATE / TIME: 202007141158
Unit Type and Rh: 5100
Unit Type and Rh: 5100
Unit Type and Rh: 5100
Unit Type and Rh: 5100

## 2019-03-05 LAB — CBC WITH DIFFERENTIAL/PLATELET
Abs Immature Granulocytes: 0.03 10*3/uL (ref 0.00–0.07)
Basophils Absolute: 0.1 10*3/uL (ref 0.0–0.1)
Basophils Relative: 1 %
Eosinophils Absolute: 0 10*3/uL (ref 0.0–0.5)
Eosinophils Relative: 1 %
HCT: 25.1 % — ABNORMAL LOW (ref 36.0–46.0)
Hemoglobin: 8.2 g/dL — ABNORMAL LOW (ref 12.0–15.0)
Immature Granulocytes: 1 %
Lymphocytes Relative: 17 %
Lymphs Abs: 0.8 10*3/uL (ref 0.7–4.0)
MCH: 30.5 pg (ref 26.0–34.0)
MCHC: 32.7 g/dL (ref 30.0–36.0)
MCV: 93.3 fL (ref 80.0–100.0)
Monocytes Absolute: 0.6 10*3/uL (ref 0.1–1.0)
Monocytes Relative: 12 %
Neutro Abs: 3.3 10*3/uL (ref 1.7–7.7)
Neutrophils Relative %: 68 %
Platelets: 277 10*3/uL (ref 150–400)
RBC: 2.69 MIL/uL — ABNORMAL LOW (ref 3.87–5.11)
RDW: 18.2 % — ABNORMAL HIGH (ref 11.5–15.5)
WBC: 4.8 10*3/uL (ref 4.0–10.5)
nRBC: 2.9 % — ABNORMAL HIGH (ref 0.0–0.2)

## 2019-03-05 LAB — BASIC METABOLIC PANEL
Anion gap: 7 (ref 5–15)
BUN: 14 mg/dL (ref 8–23)
CO2: 26 mmol/L (ref 22–32)
Calcium: 8.8 mg/dL — ABNORMAL LOW (ref 8.9–10.3)
Chloride: 110 mmol/L (ref 98–111)
Creatinine, Ser: 0.7 mg/dL (ref 0.44–1.00)
GFR calc Af Amer: >60 mL/min (ref >60)
GFR calc non Af Amer: >60 mL/min (ref >60)
Glucose, Bld: 94 mg/dL (ref 70–99)
Potassium: 3.5 mmol/L (ref 3.5–5.1)
Sodium: 143 mmol/L (ref 135–145)

## 2019-03-05 LAB — TYPE AND SCREEN
ABO/RH(D): O POS
Antibody Screen: NEGATIVE
Unit division: 0
Unit division: 0
Unit division: 0
Unit division: 0

## 2019-03-05 LAB — HEMOGLOBIN AND HEMATOCRIT, BLOOD
HCT: 27.2 % — ABNORMAL LOW (ref 36.0–46.0)
Hemoglobin: 8.6 g/dL — ABNORMAL LOW (ref 12.0–15.0)

## 2019-03-05 MED ORDER — APIXABAN 2.5 MG PO TABS: 2.5000 mg | ORAL_TABLET | Freq: Two times a day (BID) | ORAL | Status: DC

## 2019-03-05 MED ORDER — PANTOPRAZOLE SODIUM 40 MG PO TBEC: 40.0000 mg | DELAYED_RELEASE_TABLET | Freq: Two times a day (BID) | ORAL | 0 refills | Status: DC

## 2019-03-05 MED ORDER — ASPIRIN EC 81 MG PO TBEC
81.0000 mg | DELAYED_RELEASE_TABLET | Freq: Every day | ORAL | Status: DC
  Administered 2019-03-05: 81 mg via ORAL
  Filled 2019-03-05: qty 1

## 2019-03-05 NOTE — Progress Notes (Signed)
[facility] Progress Note  Chief Complaint: Gastric ulcer with bleeding  History:  [name] is feeling better today, with no abdominal pain, no chest pain, cough or dyspnea.  She has not had hematemesis or black tarry stool since prior to her upper endoscopy.  EGD findings were reviewed and case discussed with Dr. Carlean Purl earlier today.   ROS:  Urinary: Denies dysuria. Appetite fair Chronic anxiety  Objective:   Current Facility-Administered Medications:  .  0.9 %  sodium chloride infusion (Manually program via Guardrails IV Fluids), , Intravenous, Once, Gatha Mayer, MD .  0.9 %  sodium chloride infusion, 250 mL, Intravenous, PRN, Gatha Mayer, MD .  acetaminophen (TYLENOL) tablet 650 mg, 650 mg, Oral, Q4H PRN, Gatha Mayer, MD, 650 mg at 03/05/19 0753 .  aspirin EC tablet 81 mg, 81 mg, Oral, Daily, Swayze, Ava, DO, 81 mg at 03/05/19 0932 .  atorvastatin (LIPITOR) tablet 40 mg, 40 mg, Oral, q1800, Gatha Mayer, MD, 40 mg at 03/04/19 1802 .  azaTHIOprine (IMURAN) tablet 100 mg, 100 mg, Oral, Q1200, Gatha Mayer, MD, 100 mg at 03/04/19 1522 .  DULoxetine (CYMBALTA) DR capsule 60 mg, 60 mg, Oral, QHS, Gatha Mayer, MD, 60 mg at 03/04/19 2147 .  gabapentin (NEURONTIN) tablet 600 mg, 600 mg, Oral, BID, Gatha Mayer, MD, 600 mg at 03/05/19 0932 .  LORazepam (ATIVAN) tablet 0.5 mg, 0.5 mg, Oral, Daily PRN, Gatha Mayer, MD, 0.5 mg at 03/04/19 2153 .  predniSONE (DELTASONE) tablet 5 mg, 5 mg, Oral, Daily, Gatha Mayer, MD, 5 mg at 03/05/19 0932 .  sodium chloride flush (NS) 0.9 % injection 3 mL, 3 mL, Intravenous, Q12H, Gatha Mayer, MD, 3 mL at 03/05/19 0933 .  sodium chloride flush (NS) 0.9 % injection 3 mL, 3 mL, Intravenous, PRN, Gatha Mayer, MD  . sodium chloride       Vital signs in last 24 hrs: Vitals:   03/05/19 0629 03/05/19 0753  BP: (!) 150/81 140/76  Pulse: 70 66  Resp:  18  Temp: 97.8 F (36.6 C) 98.2 F (36.8 C)  SpO2: 100% 99%     Intake/Output Summary (Last 24 hours) at 03/05/2019 1021 Last data filed at 03/05/2019 0933 Gross per 24 hour  Intake 1052.08 ml  Output -  Net 1052.08 ml     Physical Exam Chronically ill-appearing woman.  Sitting on edge of bed eating breakfast, alert conversational and appropriate  HEENT: sclera anicteric, oral mucosa without lesions  Neck: supple, no thyromegaly, JVD or lymphadenopathy  Cardiac: RRR without murmurs, S1S2 heard, no peripheral edema  Pulm: clear to auscultation bilaterally, normal RR and effort noted  Abdomen: soft, no tenderness, with active bowel sounds. No guarding or palpable hepatosplenomegaly Skin; warm and dry, no jaundice, + pallor  Recent Labs:  CBC Latest Ref Rng & Units 03/05/2019 03/04/2019 03/04/2019  WBC 4.0 - 10.5 K/uL 4.8 - 4.8  Hemoglobin 12.0 - 15.0 g/dL 8.2(L) 9.5(L) 6.0(LL)  Hematocrit 36.0 - 46.0 % 25.1(L) 28.8(L) 18.6(L)  Platelets 150 - 400 K/uL 277 - 281    Recent Labs  Lab 03/04/19 0909  INR 1.2   CMP Latest Ref Rng & Units 03/05/2019 03/04/2019 03/03/2019  Glucose 70 - 99 mg/dL 94 86 109(H)  BUN 8 - 23 mg/dL 14 19 34(H)  Creatinine 0.44 - 1.00 mg/dL 0.70 0.66 0.79  Sodium 135 - 145 mmol/L 143 142 140  Potassium 3.5 - 5.1 mmol/L 3.5 3.5 3.8  Chloride 98 - 111 mmol/L 110 110 107  CO2 22 - 32 mmol/L 26 27 25   Calcium 8.9 - 10.3 mg/dL 8.8(L) 8.4(L) 8.8(L)  Total Protein 6.5 - 8.1 g/dL - - 5.6(L)  Total Bilirubin 0.3 - 1.2 mg/dL - - 0.4  Alkaline Phos 38 - 126 U/L - - 37(L)  AST 15 - 41 U/L - - 15  ALT 0 - 44 U/L - - 10     @ASSESSMENTPLANBEGIN @ Assessment:  Gastric ulcer with bleeding-clean-based ulcer, no acute bleeding at time of endoscopy, but believed to been chronically losing microscopic blood. Anemia of chronic GI blood loss Large paraesophageal hernia   Plan: I believe her bleeding has stopped, and hemoglobin is equilibrating. She can be discharged home today from a GI perspective, plan as outlined in Dr.  Celesta Aver EGD note from yesterday.  Total time 25 minutes -reviewed EGD findings and plan with patient, communicated with hospital physician.  Nelida Meuse III Office: [phone]

## 2019-03-05 NOTE — Progress Notes (Signed)
Writer offered to call patient's family to update on her condition. Patient said that she already talk with her family. Will continue to monitor.

## 2019-03-05 NOTE — Progress Notes (Signed)
Patient was discharged home by MD order; discharged instructions  review and give to patient with care notes; IV DIC; skin intact; patient will be escorted to the car by nurse tech via wheelchair.  

## 2019-03-05 NOTE — Discharge Summary (Signed)
Physician Discharge Summary  [name] KYH:[id number] DOB: May 06, 1951 DOA: 03/03/2019  PCP: Joyce Infante, MD  Admit date: 03/03/2019 Discharge date: 03/05/2019  Recommendations for Outpatient Follow-up:  1. Restart Eliquis on 03/12/2019 2. Recheck CBC on 03/09/2019 and report to PCP. 3. Follow up with PCP in 7-10 days. 4. Stop Boniva.  Follow-up Information    Joyce Shipper, MD.   Specialty: Gastroenterology Why: Office will contact you after pathology reviewed Contact information: [address] [place] [address] [phone]            Discharge Diagnoses: Principal diagnosis is #1 Acute blood loss anemia - symptomatic. 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  Filed Weights   03/03/19 1252 03/04/19 1253  Weight: 77.1 kg 77.1 kg    History of present illness:  [name] is an 68 y.o. female with past medical history significant for hypertension, GERD, Wegener's granulomatosis on Imuran, prednisone 5 and hydroxyurea daily, history of VTE history of guaiac positive stools with last colonoscopy 2017 without polyps was in her usual state of health until 2 weeks prior to admission when she noted that she was feeling more tired than usual with less exertion.  Over the past 2 weeks this feeling persisted and worsened however she notes that she had good days and bad days.  For example yesterday patient was able to do laundry and work in the garden for a little bit without feeling too poorly.  This morning patient said that she was so weak she had a hard time getting out of bed she was dizzy and felt that "I just could not go on like this".  She also did note that she had dark stools this morning.  Other than fatigue and weakness over the past 2 weeks patient denies any red stools, hematemesis, hemoptysis.  She  does have fatigue with exertion but does not necessarily think it is shortness of breath per se.  She denies any chest pain or palpitations.  No pleuritic chest pain.  She does continue to take Eliquis and aspirin and last took her Eliquis yesterday evening.  Patient denies fevers or chills.  No cough.  No abdominal pain.  Patient does admit to headache which she attributes to not being able to eat all day.  ED Course:  The patient was noted to have relatively low blood pressure and H&H returned at 4.0/13.  Patient was noted to have markedly guaiac positive stools which were dark.  [name] consulted and they are scheduling her for an EGD in the morning.  She is started on Protonix IV twice daily.  She is getting transfused 2 units PRBCs.   Hospital Course:  The patient was admitted to a telemetry bed. Her H&H was followed. Eliquis, prednisone, Boniva, and ASA were held. She required another 2 units of blood. GI was consulted. The patient underwent EGD on 03/04/2019. She was found to have a large gastric ulcer with a clean base and a large paraesophageal hernia.Although her hemoglobin dropped from 9.5 to 8.2 overnight, it has since stabilized at 8.6. Gi has cleared her for discharge to home. Recommendations are that she stop Boniva. She should restart her Eliquis after a week. She may restart prednisone and ASA. She is to take protonix 40 mg bid. She should follow up with Dr. Henrene Kaufman in 6 weeks.  Today's assessment:  S: Patient is sitting up at bedside. No new complaints. O: Vitals:  Vitals:   03/05/19 0629 03/05/19 0753  BP: (!) 150/81 140/76  Pulse: 70 66  Resp:  18  Temp: 97.8 F (36.6 C) 98.2 F (36.8 C)  SpO2: 100% 99%    Constitutional:  . The patient is awake, alert, and oriented x 3. Respiratory:  . No increased work of breathing. . No wheezes, rales, or rhonchi . No tactile fremitus. Cardiovascular:  . Regular rate and rhythm. . No murmurs, ectopy, or gallups . No lateral  PMI. No thrills. Abdomen:  . Abdomen is soft, non-tender, non-distended . No hernias, masses, or organomegaly are appreciated. . Normoactive bowel sounds. Musculoskeletal:  . No cyanosis, clubbing, or edema Skin:  . No rashes, lesions, ulcers . palpation of skin: no induration or nodules Neurologic:  . CN 2-12 intact . Sensation all 4 extremities intact Psychiatric:  . judgement and insight appear normal . Mental status o Mood, affect appropriate o Orientation to person, place, time  Discharge Instructions  Discharge Instructions    Activity as tolerated - No restrictions   Complete by: As directed    Call MD for:   Complete by: As directed    Black, maroon, or bloody stools.   Call MD for:  persistant dizziness or light-headedness   Complete by: As directed    Call MD for:  persistant nausea and vomiting   Complete by: As directed    Call MD for:  severe uncontrolled pain   Complete by: As directed    Diet - low sodium heart healthy   Complete by: As directed    Discharge instructions   Complete by: As directed    Restart Eliquis on 03/12/2019. Have CBC drawn on 03/09/2019 and report it to your PCP Follow up with PCP in 7-10 days.   Increase activity slowly   Complete by: As directed     Follow up with Dr. Henrene Kaufman in 6 weeks. Allergies as of 03/05/2019      Reactions   Latex Rash   Sensitive to latex, rash Sensitive to latex, rash   Other Itching   Adhesive tape causes rash Adhesive tape causes rash   Pregabalin Swelling   Swelling of hands and feet   Codeine Itching   REACTION: itching REACTION: itching      Medication List    STOP taking these medications   ibandronate 150 MG tablet Commonly known as: BONIVA     TAKE these medications   acetaminophen 325 MG tablet Commonly known as: TYLENOL Take 650 mg by mouth as needed for mild pain.   apixaban 5 MG Tabs tablet Commonly known as: ELIQUIS Take 1 tablet (5 mg total) by mouth 2 (two) times daily.    aspirin 81 MG EC tablet Take 1 tablet (81 mg total) by mouth daily.   atorvastatin 40 MG tablet Commonly known as: LIPITOR Take 1 tablet (40 mg total) by mouth daily at 6 PM.   azaTHIOprine 50 MG tablet Commonly known as: IMURAN Take 100 mg by mouth daily.   calcium carbonate 600 MG Tabs tablet Commonly known as: OS-CAL Take 600 mg by mouth daily.   DULoxetine 60 MG capsule Commonly known as: CYMBALTA Take 1 capsule (60 mg total) by mouth at bedtime.   fluticasone 50 MCG/ACT nasal spray Commonly known as: FLONASE Place 1 spray into both nostrils 2 (two) times daily as needed for congestion.   gabapentin 600 MG tablet Commonly known as:  NEURONTIN Take 1 tablet (600 mg total) by mouth 2 (two) times daily.   hydrochlorothiazide 12.5 MG capsule Commonly known as: MICROZIDE Take 1 capsule by mouth daily.   hydroxyurea 500 MG capsule Commonly known as: HYDREA Take 500-1,000 mg by mouth See admin instructions. 1000mg  in the morning and 500mg  at night   lisinopril 10 MG tablet Commonly known as: ZESTRIL Take 1 tablet (10 mg total) by mouth daily. What changed: how much to take   LORazepam 0.5 MG tablet Commonly known as: ATIVAN Take 1 tablet (0.5 mg total) by mouth daily as needed for anxiety.   multivitamin tablet Take 1 tablet by mouth daily.   oxyCODONE-acetaminophen 5-325 MG tablet Commonly known as: PERCOCET/ROXICET Take 1 tablet by mouth every 6 (six) hours as needed.   predniSONE 5 MG tablet Commonly known as: DELTASONE Take 5 mg by mouth daily.   VITAMIN D PO Take 1 tablet by mouth 2 (two) times daily.     Protonix 40 mg bid. Allergies  Allergen Reactions  . Latex Rash    Sensitive to latex, rash Sensitive to latex, rash  . Other Itching    Adhesive tape causes rash Adhesive tape causes rash  . Pregabalin Swelling    Swelling of hands and feet  . Codeine Itching    REACTION: itching REACTION: itching    The results of significant  diagnostics from this hospitalization (including imaging, microbiology, ancillary and laboratory) are listed below for reference.    Significant Diagnostic Studies: No results found.  Microbiology: Recent Results (from the past 240 hour(s))  SARS Coronavirus 2 (CEPHEID - Performed in [place] hospital lab), Hosp Order     Status: None   Collection Time: 03/03/19  2:12 PM   Specimen: Nasopharyngeal Swab  Result Value Ref Range Status   SARS Coronavirus 2 NEGATIVE NEGATIVE Final    Comment: (NOTE) If result is NEGATIVE SARS-CoV-2 target nucleic acids are NOT DETECTED. The SARS-CoV-2 RNA is generally detectable in upper and lower  respiratory specimens during the acute phase of infection. The lowest  concentration of SARS-CoV-2 viral copies this assay can detect is 250  copies / mL. A negative result does not preclude SARS-CoV-2 infection  and should not be used as the sole basis for treatment or other  patient management decisions.  A negative result may occur with  improper specimen collection / handling, submission of specimen other  than nasopharyngeal swab, presence of viral mutation(s) within the  areas targeted by this assay, and inadequate number of viral copies  (<250 copies / mL). A negative result must be combined with clinical  observations, patient history, and epidemiological information. If result is POSITIVE SARS-CoV-2 target nucleic acids are DETECTED. The SARS-CoV-2 RNA is generally detectable in upper and lower  respiratory specimens dur ing the acute phase of infection.  Positive  results are indicative of active infection with SARS-CoV-2.  Clinical  correlation with patient history and other diagnostic information is  necessary to determine patient infection status.  Positive results do  not rule out bacterial infection or co-infection with other viruses. If result is PRESUMPTIVE POSTIVE SARS-CoV-2 nucleic acids MAY BE PRESENT.   A presumptive positive result  was obtained on the submitted specimen  and confirmed on repeat testing.  While 2019 novel coronavirus  (SARS-CoV-2) nucleic acids may be present in the submitted sample  additional confirmatory testing may be necessary for epidemiological  and / or clinical management purposes  to differentiate between  SARS-CoV-2 and other  Sarbecovirus currently known to infect humans.  If clinically indicated additional testing with an alternate test  methodology [id number]) is advised. The SARS-CoV-2 RNA is generally  detectable in upper and lower respiratory sp ecimens during the acute  phase of infection. The expected result is Negative. Fact Sheet for Patients:  StrictlyIdeas.no Fact Sheet for Healthcare Providers: BankingDealers.co.za This test is not yet approved or cleared by the [place] FDA and has been authorized for detection and/or diagnosis of SARS-CoV-2 by FDA under an Emergency Use Authorization (EUA).  This EUA will remain in effect (meaning this test can be used) for the duration of the COVID-19 declaration under Section 564(b)(1) of the Act, 21 U.S.C. section 360bbb-3(b)(1), unless the authorization is terminated or revoked sooner. Performed at [facility], [place] [address], [place], [place] [address]   MRSA PCR Screening     Status: None   Collection Time: 03/04/19  9:26 AM   Specimen: Nasopharyngeal  Result Value Ref Range Status   MRSA by PCR NEGATIVE NEGATIVE Final    Comment:        The GeneXpert MRSA Assay (FDA approved for NASAL specimens only), is one component of a comprehensive MRSA colonization surveillance program. It is not intended to diagnose MRSA infection nor to guide or monitor treatment for MRSA infections. Performed at [facility], [place] [address] [place],  [address]      Labs: Basic Metabolic Panel: Recent Labs  Lab 03/03/19 1259 03/04/19 0552 03/05/19 0327  NA 140 142 143   K 3.8 3.5 3.5  CL 107 110 110  CO2 25 27 26   GLUCOSE 109* 86 94  BUN 34* 19 14  CREATININE 0.79 0.66 0.70  CALCIUM 8.8* 8.4* 8.8*   Liver Function Tests: Recent Labs  Lab 03/03/19 1259  AST 15  ALT 10  ALKPHOS 37*  BILITOT 0.4  PROT 5.6*  ALBUMIN 3.0*   No results for input(s): LIPASE, AMYLASE in the last 168 hours. No results for input(s): AMMONIA in the last 168 hours. CBC: Recent Labs  Lab 03/03/19 1259 03/03/19 1957 03/04/19 0552 03/04/19 1655 03/05/19 0327 03/05/19 0945  WBC 5.7  --  4.8  --  4.8  --   NEUTROABS 4.8  --   --   --  3.3  --   HGB 4.0* 4.8* 6.0* 9.5* 8.2* 8.6*  HCT 13.6* 15.2* 18.6* 28.8* 25.1* 27.2*  MCV 107.1*  --  96.4  --  93.3  --   PLT 373  --  281  --  277  --    Cardiac Enzymes: No results for input(s): CKTOTAL, CKMB, CKMBINDEX, TROPONINI in the last 168 hours. BNP: BNP (last 3 results) No results for input(s): BNP in the last 8760 hours.  ProBNP (last 3 results) No results for input(s): PROBNP in the last 8760 hours.  CBG: No results for input(s): GLUCAP in the last 168 hours.  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  Signed:        Infant Zink, DO [facility]  03/05/2019, 11:50 AM

## 2019-03-05 NOTE — Consult Note (Signed)
   [facility] Inpatient Consult   03/05/2019  Naryiah Schley [birth year] [id number]    Patientchecked ifpotential [place] services are neededunder her Medicare/ Next[employer] plan. Review of patient's medical record reveals patienthad 3 hospitalizations in the past 6 months, with 18% risk score for unplanned readmissions and hospitalizations. Patient was previously outreached by [place] for EMMI Stroke follow-up calls.  Per MD history and physical on 03/03/19 states as follows:  Keron Koffman Normanis an 68 y.o.femalewith past medical history significant for hypertension, GERD, Wegener's granulomatosis on Imuran, prednisone 5 and hydroxyurea daily, history of VTE history of guaiac positive stools with last colonoscopy 2017 without polyps. She required another 2 units of blood. GI was consulted and patient underwent EGD on 03/04/2019. She was found to have a large gastric ulcer with a clean base and a large paraesophageal hernia. Although her hemoglobin dropped from 9.5 to 8.2 overnight, it has since stabilized at 8.6. GI has cleared her for discharge to home (Acute blood loss anemia - symptomatic, GI Bleed due to gastric ulcer)  Patient transitionedto home prior to speaking with her. Currently, has noTHN Care Management needs noted.  Will follow with General EMMI calls.  Primary Care Provider isDr. Crist Infante with [facility], listed as providing transition of care.   For questions, please call:  Edwena Felty A. Aymar Whitfill, BSN, RN-BC [facility] Liaison Cell: [phone]

## 2019-03-06 DIAGNOSIS — M8589 Other specified disorders of bone density and structure, multiple sites: Secondary | ICD-10-CM | POA: Diagnosis not present

## 2019-03-06 DIAGNOSIS — M313 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: Secondary | ICD-10-CM | POA: Diagnosis not present

## 2019-03-06 DIAGNOSIS — K254 Chronic or unspecified gastric ulcer with hemorrhage: Secondary | ICD-10-CM | POA: Diagnosis not present

## 2019-03-06 DIAGNOSIS — Z79899 Other long term (current) drug therapy: Secondary | ICD-10-CM | POA: Diagnosis not present

## 2019-03-10 ENCOUNTER — Other Ambulatory Visit: Payer: Self-pay

## 2019-03-10 DIAGNOSIS — D62 Acute posthemorrhagic anemia: Secondary | ICD-10-CM

## 2019-03-10 NOTE — Progress Notes (Signed)
Please let her know the biopsies do not show cancer or infection. She needs another feraheme infusion - if noone else is setting that up please arrange one for next week  Dx chronic blood loss anemia  Send a copy of path to PCP (fax please) She sees Dr. Henrene Pastor 8/24

## 2019-03-11 DIAGNOSIS — Z7901 Long term (current) use of anticoagulants: Secondary | ICD-10-CM | POA: Diagnosis not present

## 2019-03-11 DIAGNOSIS — K922 Gastrointestinal hemorrhage, unspecified: Secondary | ICD-10-CM | POA: Diagnosis not present

## 2019-03-11 DIAGNOSIS — I2699 Other pulmonary embolism without acute cor pulmonale: Secondary | ICD-10-CM | POA: Diagnosis not present

## 2019-03-11 DIAGNOSIS — I480 Paroxysmal atrial fibrillation: Secondary | ICD-10-CM | POA: Diagnosis not present

## 2019-03-11 DIAGNOSIS — D62 Acute posthemorrhagic anemia: Secondary | ICD-10-CM | POA: Diagnosis not present

## 2019-03-11 DIAGNOSIS — M313 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: Secondary | ICD-10-CM | POA: Diagnosis not present

## 2019-03-11 DIAGNOSIS — F419 Anxiety disorder, unspecified: Secondary | ICD-10-CM | POA: Diagnosis not present

## 2019-03-19 ENCOUNTER — Ambulatory Visit (HOSPITAL_COMMUNITY)
Admission: RE | Admit: 2019-03-19 | Discharge: 2019-03-19 | Disposition: A | Discharge status: Home / Self Care | Payer: Medicare Other | Source: Ambulatory Visit | Attending: Internal Medicine | Admitting: Internal Medicine

## 2019-03-19 ENCOUNTER — Other Ambulatory Visit: Payer: Self-pay

## 2019-03-19 DIAGNOSIS — D62 Acute posthemorrhagic anemia: Secondary | ICD-10-CM

## 2019-03-19 MED ORDER — SODIUM CHLORIDE 0.9 % IV SOLN
510.0000 mg | Freq: Once | INTRAVENOUS | Status: AC
  Administered 2019-03-19: 510 mg via INTRAVENOUS
  Filled 2019-03-19: qty 17

## 2019-03-19 MED ORDER — SODIUM CHLORIDE 0.9 % IV SOLN
INTRAVENOUS | Status: DC | PRN
  Administered 2019-03-19: 250 mL via INTRAVENOUS

## 2019-03-19 NOTE — Discharge Instructions (Signed)

## 2019-03-19 NOTE — Progress Notes (Signed)
                   Patient Care Center Note   Diagnosis: Iron Deficiency   Provider: Gatha Mayer MD   Procedure: IV Feraheme   Note: Received IV Feraheme via PIV. Tolerated well. Observed 30 minutes post-transfusion. Vital signs stable.  Discharge instructions given. Patient alert, oriented and ambulatory at discharge.  Otho Bellows, RN

## 2019-03-20 DIAGNOSIS — D62 Acute posthemorrhagic anemia: Secondary | ICD-10-CM | POA: Diagnosis not present

## 2019-03-20 DIAGNOSIS — R946 Abnormal results of thyroid function studies: Secondary | ICD-10-CM | POA: Diagnosis not present

## 2019-03-20 DIAGNOSIS — K922 Gastrointestinal hemorrhage, unspecified: Secondary | ICD-10-CM | POA: Diagnosis not present

## 2019-03-20 DIAGNOSIS — D649 Anemia, unspecified: Secondary | ICD-10-CM | POA: Diagnosis not present

## 2019-04-07 ENCOUNTER — Ambulatory Visit
Admission: RE | Admit: 2019-04-07 | Discharge: 2019-04-07 | Disposition: A | Discharge status: Home / Self Care | Payer: Medicare Other | Source: Ambulatory Visit | Attending: Internal Medicine | Admitting: Internal Medicine

## 2019-04-07 ENCOUNTER — Other Ambulatory Visit: Payer: Self-pay

## 2019-04-07 DIAGNOSIS — Z1231 Encounter for screening mammogram for malignant neoplasm of breast: Secondary | ICD-10-CM | POA: Diagnosis not present

## 2019-04-08 DIAGNOSIS — D485 Neoplasm of uncertain behavior of skin: Secondary | ICD-10-CM | POA: Diagnosis not present

## 2019-04-08 DIAGNOSIS — D229 Melanocytic nevi, unspecified: Secondary | ICD-10-CM | POA: Diagnosis not present

## 2019-04-08 DIAGNOSIS — L814 Other melanin hyperpigmentation: Secondary | ICD-10-CM | POA: Diagnosis not present

## 2019-04-08 DIAGNOSIS — L820 Inflamed seborrheic keratosis: Secondary | ICD-10-CM | POA: Diagnosis not present

## 2019-04-08 DIAGNOSIS — L821 Other seborrheic keratosis: Secondary | ICD-10-CM | POA: Diagnosis not present

## 2019-04-08 DIAGNOSIS — L570 Actinic keratosis: Secondary | ICD-10-CM | POA: Diagnosis not present

## 2019-04-08 DIAGNOSIS — L219 Seborrheic dermatitis, unspecified: Secondary | ICD-10-CM | POA: Diagnosis not present

## 2019-04-08 DIAGNOSIS — D1801 Hemangioma of skin and subcutaneous tissue: Secondary | ICD-10-CM | POA: Diagnosis not present

## 2019-04-14 ENCOUNTER — Ambulatory Visit (INDEPENDENT_AMBULATORY_CARE_PROVIDER_SITE_OTHER): Payer: Medicare Other | Admitting: Internal Medicine

## 2019-04-14 ENCOUNTER — Encounter: Payer: Self-pay | Admitting: Internal Medicine

## 2019-04-14 VITALS — BP 110/62 | HR 81 | Temp 98.3°F | Ht 63.0 in | Wt 179.0 lb

## 2019-04-14 DIAGNOSIS — D509 Iron deficiency anemia, unspecified: Secondary | ICD-10-CM

## 2019-04-14 DIAGNOSIS — K219 Gastro-esophageal reflux disease without esophagitis: Secondary | ICD-10-CM

## 2019-04-14 DIAGNOSIS — I6381 Other cerebral infarction due to occlusion or stenosis of small artery: Secondary | ICD-10-CM

## 2019-04-14 DIAGNOSIS — K259 Gastric ulcer, unspecified as acute or chronic, without hemorrhage or perforation: Secondary | ICD-10-CM | POA: Diagnosis not present

## 2019-04-14 MED ORDER — PANTOPRAZOLE SODIUM 40 MG PO TBEC: 40.0000 mg | DELAYED_RELEASE_TABLET | Freq: Two times a day (BID) | ORAL | 3 refills | Status: DC

## 2019-04-14 NOTE — Patient Instructions (Signed)
We have sent the following medications to your pharmacy for you to pick up at your convenience:  Pantoprazole

## 2019-04-14 NOTE — Progress Notes (Signed)
HISTORY OF PRESENT ILLNESS:  [name] is a very pleasant 68 y.o. female with MULTIPLE significant medical problems including but not limited to Wegener's granulomatosis, prior stroke, DVT/PE on Eliquis, recurrent iron deficiency anemia for which she is undergone multiple GI work-ups.  GERD with large hiatal hernia complicated by Lysbeth Galas erosions.  She is sent today after being hospitalized with symptomatic anemia (hemoglobin about 6).  She is followed by hematologist at [facility] as well as a rheumatologist at [facility].  I have performed colonoscopy and upper endoscopy on the patient.  She also underwent colonoscopy and upper endoscopy at [facility] in July 2017.  Upper endoscopy revealed erosions.  Colonoscopy was negative for which follow-up in 10 years was recommended.  Her most recent hospitalization she did undergo upper endoscopy with Dr. Carlean Purl March 04, 2019 due to both a complaint of melena and recurrent iron deficiency anemia.  She was found to have large hernia as well as Cameron erosions and gastric ulcer on the gastric body.  She was treated with twice daily PPI.  Continue on aspirin for medical problems.  Follow-up at this time arranged.  Patient tells me that she is doing much better.  Her most recent hemoglobin was 10 (her baseline).  She does not take oral iron but has her iron levels monitored by her hematologist with periodic iron infusion therapy.  Her stool color is normal.  Regular diet without difficulties.  No other GI complaints.  She does request a refill of her medication (pantoprazole).  REVIEW OF SYSTEMS:  All non-GI ROS negative unless otherwise stated in the HPI except for right-sided weakness  Past Medical History:  Diagnosis Date  . Allergic rhinitis   . 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  No definite vasculitis BM bx not c/w lymphoma   . Atrial fibrillation  with RVR ([place])   . Cameron lesion, chronic   . Cervical polyp 03/2003  . DDD (degenerative disc disease)   . Depression    Dr. Toy Care (situational, divorce, loss of parents)  . Diverticulosis   . DVT (deep venous thrombosis) ([facility])    both legs  . Esophageal stricture   . GERD (gastroesophageal reflux disease)   . 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 Ginger Organ at [facility]  . Hearing loss   . Hiatal hernia - ? paraesophageal   . Hyperlipidemia   . Hypertension   . Iron deficiency anemia due to chronic blood loss - hiatal hernia with Cameron's lesions    recurrent iron defic.  . Lacunar infarct, acute ([place]) 11/05/2018  . Long term (current) use of systemic steroids   . 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 Neuropathy, peripheral   . OSA (obstructive sleep apnea)   . Osteoarthritis   . Osteopenia   . Overactive bladder   . Pulmonary embolism ([place])    left lung  . Ulcer of lower limb ([place]) 01/02/2014    Past Surgical History:  Procedure Laterality Date  . BIOPSY  03/04/2019   Procedure: BIOPSY;  Surgeon: Gatha Mayer, MD;  Location: [facility] ENDOSCOPY;  Service: Endoscopy;;  . BRONCHOSCOPY  april 2012  . COLONOSCOPY    . ESOPHAGOGASTRODUODENOSCOPY    . ESOPHAGOGASTRODUODENOSCOPY (EGD) WITH PROPOFOL N/A 03/04/2019   Procedure: ESOPHAGOGASTRODUODENOSCOPY (EGD) WITH PROPOFOL;  Surgeon: Gatha Mayer, MD;  Location: [facility];  Service: Endoscopy;  Laterality: N/A;  .  IVC FILTER INSERTION    . LUMBAR EPIDURAL INJECTION     steroid injection x2  . LUNG BIOPSY  may 2012  . PARTIAL HIP ARTHROPLASTY Left 03/2014    Social History [name]  reports that she has never smoked. She has never used smokeless tobacco. She reports that she does not drink alcohol or use drugs.  family history includes Arthritis in her father; Cancer in her father; Deep vein thrombosis in her father; Diabetes in her maternal aunt, maternal  grandfather, and maternal uncle; Hypertension in her father, mother, and sister; Lung cancer in her father; Neuropathy in her mother; Stroke in her father; Varicose Veins in her father and mother.  Allergies  Allergen Reactions  . Latex Rash    Sensitive to latex, rash Sensitive to latex, rash  . Other Itching    Adhesive tape causes rash Adhesive tape causes rash  . Pregabalin Swelling    Swelling of hands and feet  . Codeine Itching    REACTION: itching REACTION: itching       PHYSICAL EXAMINATION: Vital signs: BP 110/62   Pulse 81   Temp 98.3 F (36.8 C)   Ht 5\' 3"  (1.6 m)   Wt 179 lb (81.2 kg)   LMP 12/20/2002   BMI 31.71 kg/m   Constitutional: generally well-appearing, no acute distress Psychiatric: alert and oriented x3, cooperative Eyes: extraocular movements intact, anicteric, conjunctiva pink Mouth: oral pharynx moist, no lesions Neck: supple no lymphadenopathy Cardiovascular: heart regular rate and rhythm, no murmur Lungs: clear to auscultation bilaterally Abdomen: soft, nontender, nondistended, no obvious ascites, no peritoneal signs, normal bowel sounds, no organomegaly Rectal: Omitted Extremities: no clubbing, cyanosis, or lower extremity edema bilaterally Skin: no lesions on visible extremities Neuro: No gross focal deficits.  Cranial nerves intact  ASSESSMENT:  1.  Large hiatal hernia with Cameron erosions and recent gastric ulcer.  Last upper endoscopy July 2020 2.  Recurrent iron deficiency anemia secondary to the same. 3.  Multiple medical problems including the need for chronic oral anticoagulation 4.  Last colonoscopy unremarkable July 2017   PLAN:  1.  Continue pantoprazole 40 mg twice daily.  Her prescription has been refilled for 1 year. 2.  Continue close monitoring of blood counts and iron levels per her hematologist with iron infusion therapy as indicated 3.  Repeat colonoscopy around 2027 4.  Ongoing general medical care with Dr.  [name] 5.  GI follow-up as needed Patient evaluation was 25 minutes.  Greater than 50% of time used for counseling coordination of care.

## 2019-04-18 NOTE — Progress Notes (Signed)
Joyce, Kaufman ([id number]) Visit Report for 12/09/2018 Arrival Information Details Patient Name: [name], [name]. Date of Service: 12/09/2018 1:00 PM Medical Record Number: [id number] Patient Account Number: [id number] Date of Birth/Sex: May 17, 1951 (68 y.o. F) Treating RN: Harold Barban Primary Care Nelissa Bolduc: Crist Infante Other Clinician: Referring Claudie Rathbone: Crist Infante Treating Nary Sneed/Extender: Melburn Hake, HOYT Weeks in Treatment: 12 Visit Information History Since Last Visit Added or deleted any medications: No Patient Arrived: Ambulatory Any new allergies or adverse reactions: No Arrival Time: 13:44 Had a fall or experienced change in No Accompanied By: self activities of daily living that may affect Transfer Assistance: None risk of falls: Patient Identification Verified: Yes Signs or symptoms of abuse/neglect since last visito No Secondary Verification Process Yes Hospitalized since last visit: No Completed: Has Dressing in Place as Prescribed: Yes Patient Has Alerts: Yes Pain Present Now: No Patient Alerts: Patient on Blood Thinner Eliquis Electronic Signature(s) Signed: 12/09/2018 2:49:09 PM By: Lorine Bears RCP, RRT, CHT Entered By: Lorine Bears on 12/09/2018 13:46:49 [name] ([id number]) -------------------------------------------------------------------------------- Clinic Level of Care Assessment Details Patient Name: Joyce, Kaufman. Date of Service: 12/09/2018 1:00 PM Medical Record Number: [id number] Patient Account Number: [id number] Date of Birth/Sex: Dec 16, 1950 (68 y.o. F) Treating RN: Harold Barban Primary Care Ahman Dugdale: Crist Infante Other Clinician: Referring Tu Shimmel: Crist Infante Treating Macoy Rodwell/Extender: Melburn Hake, HOYT Weeks in Treatment: 12 Clinic Level of Care Assessment Items TOOL 4 Quantity Score []  - Use when only an EandM is performed on FOLLOW-UP visit 0 ASSESSMENTS - Nursing Assessment /  Reassessment X - Reassessment of Co-morbidities (includes updates in patient status) 1 10 X- 1 5 Reassessment of Adherence to Treatment Plan ASSESSMENTS - Wound and Skin Assessment / Reassessment X - Simple Wound Assessment / Reassessment - one wound 1 5 []  - 0 Complex Wound Assessment / Reassessment - multiple wounds []  - 0 Dermatologic / Skin Assessment (not related to wound area) ASSESSMENTS - Focused Assessment []  - Circumferential Edema Measurements - multi extremities 0 []  - 0 Nutritional Assessment / Counseling / Intervention []  - 0 Lower Extremity Assessment (monofilament, tuning fork, pulses) []  - 0 Peripheral Arterial Disease Assessment (using hand held doppler) ASSESSMENTS - Ostomy and/or Continence Assessment and Care []  - Incontinence Assessment and Management 0 []  - 0 Ostomy Care Assessment and Management (repouching, etc.) PROCESS - Coordination of Care X - Simple Patient / Family Education for ongoing care 1 15 []  - 0 Complex (extensive) Patient / Family Education for ongoing care []  - 0 Staff obtains Programmer, systems, Records, Test Results / Process Orders []  - 0 Staff telephones HHA, Nursing Homes / Clarify orders / etc []  - 0 Routine Transfer to another Facility (non-emergent condition) []  - 0 Routine Hospital Admission (non-emergent condition) []  - 0 New Admissions / Biomedical engineer / Ordering NPWT, Apligraf, etc. []  - 0 Emergency Hospital Admission (emergent condition) X- 1 10 Simple Discharge Coordination KARCYN, MENN ([id number]) []  - 0 Complex (extensive) Discharge Coordination PROCESS - Special Needs []  - Pediatric / Minor Patient Management 0 []  - 0 Isolation Patient Management []  - 0 Hearing / Language / Visual special needs []  - 0 Assessment of Community assistance (transportation, D/C planning, etc.) []  - 0 Additional assistance / Altered mentation []  - 0 Support Surface(s) Assessment (bed, cushion, seat, etc.) INTERVENTIONS -  Wound Cleansing / Measurement X - Simple Wound Cleansing - one wound 1 5 []  - 0 Complex Wound Cleansing - multiple wounds X- 1 5 Wound Imaging (photographs - any  number of wounds) []  - 0 Wound Tracing (instead of photographs) X- 1 5 Simple Wound Measurement - one wound []  - 0 Complex Wound Measurement - multiple wounds INTERVENTIONS - Wound Dressings X - Small Wound Dressing one or multiple wounds 1 10 []  - 0 Medium Wound Dressing one or multiple wounds []  - 0 Large Wound Dressing one or multiple wounds []  - 0 Application of Medications - topical []  - 0 Application of Medications - injection INTERVENTIONS - Miscellaneous []  - External ear exam 0 []  - 0 Specimen Collection (cultures, biopsies, blood, body fluids, etc.) []  - 0 Specimen(s) / Culture(s) sent or taken to Lab for analysis []  - 0 Patient Transfer (multiple staff / Civil Service fast streamer / Similar devices) []  - 0 Simple Staple / Suture removal (25 or less) []  - 0 Complex Staple / Suture removal (26 or more) []  - 0 Hypo / Hyperglycemic Management (close monitor of Blood Glucose) []  - 0 Ankle / Brachial Index (ABI) - do not check if billed separately X- 1 5 Vital Signs KIENNA, MONCADA ([id number]) Has the patient been seen at the hospital within the last three years: Yes Total Score: 75 Level Of Care: New/Established - Level 2 Electronic Signature(s) Signed: 04/18/2019 1:03:35 PM By: Harold Barban Entered By: Harold Barban on 12/09/2018 14:13:17 [name] ([id number]) -------------------------------------------------------------------------------- Encounter Discharge Information Details Patient Name: Joyce, Kaufman. Date of Service: 12/09/2018 1:00 PM Medical Record Number: [id number] Patient Account Number: [id number] Date of Birth/Sex: 1950-10-20 (68 y.o. F) Treating RN: Cornell Barman Primary Care Lainie Daubert: Crist Infante Other Clinician: Referring Anand Tejada: Crist Infante Treating Yosmar Ryker/Extender: Melburn Hake,  HOYT Weeks in Treatment: 12 Encounter Discharge Information Items Discharge Condition: Stable Ambulatory Status: Ambulatory Discharge Destination: Home Transportation: Private Auto Accompanied By: self Schedule Follow-up Appointment: Yes Clinical Summary of Care: Electronic Signature(s) Signed: 12/10/2018 3:23:54 PM By: Gretta Cool, BSN, RN, CWS, [name] RN, BSN Entered By: Gretta Cool, BSN, RN, CWS, [name] on 12/09/2018 14:18:19 [name] ([id number]) -------------------------------------------------------------------------------- Lower Extremity Assessment Details Patient Name: DELENE, MORAIS. Date of Service: 12/09/2018 1:00 PM Medical Record Number: [id number] Patient Account Number: [id number] Date of Birth/Sex: Dec 13, 1950 (68 y.o. F) Treating RN: Montey Hora Primary Care Dacen Frayre: Crist Infante Other Clinician: Referring Davis Vannatter: Crist Infante Treating Berenice Oehlert/Extender: Melburn Hake, HOYT Weeks in Treatment: 12 Edema Assessment Assessed: [Left: No] [Right: No] Edema: [Left: Ye] [Right: s] Calf Left: Right: Point of Measurement: 32 cm From Medial Instep cm 36 cm Ankle Left: Right: Point of Measurement: 12 cm From Medial Instep cm 22.5 cm Vascular Assessment Pulses: Dorsalis Pedis Palpable: [Right:Yes] Electronic Signature(s) Signed: 12/10/2018 2:55:40 PM By: Montey Hora Entered By: Montey Hora on 12/09/2018 13:56:57 Scalisi, Greenwood ([id number]) -------------------------------------------------------------------------------- Multi Wound Chart Details Patient Name: [name]. Date of Service: 12/09/2018 1:00 PM Medical Record Number: [id number] Patient Account Number: [id number] Date of Birth/Sex: [birth year] (68 y.o. F) Treating RN: Harold Barban Primary Care Eleri Ruben: Crist Infante Other Clinician: Referring Lashai Grosch: Crist Infante Treating Artem Bunte/Extender: Melburn Hake, HOYT Weeks in Treatment: 12 Vital Signs Height(in): 63 Pulse(bpm): 75 Weight(lbs): 174 Blood  Pressure(mmHg): 141/78 Body Mass Index(BMI): 31 Temperature(F): 98.3 Respiratory Rate 16 (breaths/min): Photos: [N/A:N/A] Wound Location: Right Lower Leg - Lateral N/A N/A Wounding Event: Bump N/A N/A Primary Etiology: Auto-immune N/A N/A Comorbid History: Arrhythmia, Deep Vein N/A N/A Thrombosis, Hypertension, Osteoarthritis, Received Chemotherapy Date Acquired: 08/01/2018 N/A N/A Weeks of Treatment: 12 N/A N/A Wound Status: Open N/A N/A Measurements L x W x D 1.4x1.2x0.3 N/A N/A (cm) Area (cm) :  1.319 N/A N/A Volume (cm) : 0.396 N/A N/A % Reduction in Area: 69.50% N/A N/A % Reduction in Volume: 81.70% N/A N/A Classification: Full Thickness Without N/A N/A Exposed Support Structures Exudate Amount: Small N/A N/A Exudate Type: Serous N/A N/A Exudate Color: amber N/A N/A Wound Margin: Flat and Intact N/A N/A Granulation Amount: Small (1-33%) N/A N/A Granulation Quality: Pink, Hyper-granulation N/A N/A Necrotic Amount: Large (67-100%) N/A N/A Exposed Structures: Fat Layer (Subcutaneous N/A N/A Tissue) Exposed: Yes Fascia: No Tendon: No KEESHIA, SANDERLIN ([id number]) Muscle: No Joint: No Bone: No Epithelialization: None N/A N/A Erythema Location: Circumferential N/A N/A Treatment Notes Electronic Signature(s) Signed: 04/18/2019 1:03:35 PM By: Harold Barban Entered By: Harold Barban on 12/09/2018 14:12:22 [name] ([id number]) -------------------------------------------------------------------------------- Multi-Disciplinary Care Plan Details Patient Name: JULIANAH, MARCIEL. Date of Service: 12/09/2018 1:00 PM Medical Record Number: [id number] Patient Account Number: [id number] Date of Birth/Sex: 1951-03-24 (68 y.o. F) Treating RN: Harold Barban Primary Care Shelise Maron: Crist Infante Other Clinician: Referring Hoyt Leanos: Crist Infante Treating Eloise Picone/Extender: Melburn Hake, HOYT Weeks in Treatment: 12 Active Inactive Necrotic Tissue Nursing Diagnoses: Impaired  tissue integrity related to necrotic/devitalized tissue Knowledge deficit related to management of necrotic/devitalized tissue Goals: Necrotic/devitalized tissue will be minimized in the wound bed Date Initiated: 09/12/2018 Target Resolution Date: 12/21/2018 Goal Status: Active Interventions: Assess patient pain level pre-, during and post procedure and prior to discharge Provide education on necrotic tissue and debridement process Treatment Activities: Apply topical anesthetic as ordered : 09/12/2018 Notes: Orientation to the Wound Care Program Nursing Diagnoses: Knowledge deficit related to the wound healing center program Goals: Patient/caregiver will verbalize understanding of the [place] Date Initiated: 09/12/2018 Target Resolution Date: 12/21/2018 Goal Status: Active Interventions: Provide education on orientation to the wound center Notes: Pain, Acute or Chronic Nursing Diagnoses: Pain Management - Non-cyclic Acute (Procedural) Goals: Patient will verbalize adequate pain control and receive pain control interventions during procedures as needed LEDIA, HANFORD ([id number]) Date Initiated: 09/12/2018 Target Resolution Date: 12/21/2018 Goal Status: Active Interventions: Assess comfort goal upon admission Encourage patient to take pain medications as prescribed Treatment Activities: Administer pain control measures as ordered : 09/12/2018 Notes: Wound/Skin Impairment Nursing Diagnoses: Impaired tissue integrity Goals: Ulcer/skin breakdown will have a volume reduction of 30% by week 4 Date Initiated: 09/12/2018 Target Resolution Date: 12/21/2018 Goal Status: Active Interventions: Assess patient/caregiver ability to obtain necessary supplies Assess patient/caregiver ability to perform ulcer/skin care regimen upon admission and as needed Assess ulceration(s) every visit Treatment Activities: Referred to DME Yaden Seith for dressing supplies : 09/12/2018 Skin  care regimen initiated : 09/12/2018 Notes: Electronic Signature(s) Signed: 04/18/2019 1:03:35 PM By: Harold Barban Entered By: Harold Barban on 12/09/2018 [place], Paia. ([id number]) -------------------------------------------------------------------------------- Pain Assessment Details Patient Name: BRYSTOL, WASILEWSKI. Date of Service: 12/09/2018 1:00 PM Medical Record Number: [id number] Patient Account Number: [id number] Date of Birth/Sex: 14-Nov-1950 (68 y.o. F) Treating RN: Harold Barban Primary Care Fernado Brigante: Crist Infante Other Clinician: Referring Brittiney Dicostanzo: Crist Infante Treating Koleson Reifsteck/Extender: Melburn Hake, HOYT Weeks in Treatment: 12 Active Problems Location of Pain Severity and Description of Pain Patient Has Paino No Site Locations Pain Management and Medication Current Pain Management: Electronic Signature(s) Signed: 12/09/2018 2:49:09 PM By: Lorine Bears RCP, RRT, CHT Signed: 04/18/2019 1:03:35 PM By: Harold Barban Entered By: Lorine Bears on 12/09/2018 13:46:57 Nevin, [name] ([id number]) -------------------------------------------------------------------------------- Patient/Caregiver Education Details Patient Name: SELETHA, ZIMMERMANN. Date of Service: 12/09/2018 1:00 PM Medical Record Number: [id number] Patient Account Number: [id number] Date of Birth/Gender:  [birth year] (68 y.o. F) Treating RN: Harold Barban Primary Care Physician: Crist Infante Other Clinician: Referring Physician: Crist Infante Treating Physician/Extender: Sharalyn Ink in Treatment: 12 Education Assessment Education Provided To: Patient Education Topics Provided Wound/Skin Impairment: Handouts: Caring for Your Ulcer Methods: Demonstration, Explain/Verbal Responses: State content correctly Electronic Signature(s) Signed: 04/18/2019 1:03:35 PM By: Harold Barban Entered By: Harold Barban on 12/09/2018 14:12:37 [name]  ([id number]) -------------------------------------------------------------------------------- Wound Assessment Details Patient Name: QUYEN, CUTSFORTH. Date of Service: 12/09/2018 1:00 PM Medical Record Number: [id number] Patient Account Number: [id number] Date of Birth/Sex: [birth year] (68 y.o. F) Treating RN: Montey Hora Primary Care Tanis Burnley: Crist Infante Other Clinician: Referring Ashling Roane: Crist Infante Treating Elly Haffey/Extender: Melburn Hake, HOYT Weeks in Treatment: 12 Wound Status Wound Number: 1 Primary Auto-immune Etiology: Wound Location: Right Lower Leg - Lateral Wound Open Wounding Event: Bump Status: Date Acquired: 08/01/2018 Comorbid Arrhythmia, Deep Vein Thrombosis, Weeks Of Treatment: 12 History: Hypertension, Osteoarthritis, Received Clustered Wound: No Chemotherapy Photos Wound Measurements Length: (cm) 1.4 Width: (cm) 1.2 Depth: (cm) 0.3 Area: (cm) 1.319 Volume: (cm) 0.396 % Reduction in Area: 69.5% % Reduction in Volume: 81.7% Epithelialization: None Tunneling: No Undermining: No Wound Description Full Thickness Without Exposed Support Foul Od Classification: Structures Slough/ Wound Margin: Flat and Intact Exudate Medium Amount: Exudate Type: Serous Exudate Color: amber or After Cleansing: No Fibrino Yes Wound Bed Granulation Amount: Small (1-33%) Exposed Structure Granulation Quality: Pink, Hyper-granulation Fascia Exposed: No Necrotic Amount: Large (67-100%) Fat Layer (Subcutaneous Tissue) Exposed: Yes Necrotic Quality: Adherent Slough Tendon Exposed: No Muscle Exposed: No Joint Exposed: No Bone Exposed: No TAILYN, HANTZ ([id number]) Periwound Skin Texture Texture Color No Abnormalities Noted: No No Abnormalities Noted: No Callus: No Atrophie Blanche: No Crepitus: No Cyanosis: No Excoriation: Yes Ecchymosis: Yes Induration: Yes Erythema: Yes Rash: No Erythema Location: Circumferential Scarring: No Hemosiderin Staining:  No Mottled: No Moisture Pallor: No No Abnormalities Noted: No Rubor: No Dry / Scaly: No Maceration: No Temperature / Pain Temperature: No Abnormality Tenderness on Palpation: Yes Electronic Signature(s) Signed: 12/11/2018 10:38:31 AM By: Harold Barban Signed: 12/11/2018 2:28:39 PM By: Montey Hora Previous Signature: 12/10/2018 2:55:40 PM Version By: Montey Hora Entered By: Harold Barban on 12/11/2018 10:38:31 [name] ([id number]) -------------------------------------------------------------------------------- Vitals Details Patient Name: SHABNAM, LADD. Date of Service: 12/09/2018 1:00 PM Medical Record Number: [id number] Patient Account Number: [id number] Date of Birth/Sex: [birth year] (68 y.o. F) Treating RN: Harold Barban Primary Care Belladonna Lubinski: Crist Infante Other Clinician: Referring Elesa Garman: Crist Infante Treating Neal Trulson/Extender: Melburn Hake, HOYT Weeks in Treatment: 12 Vital Signs Time Taken: 13:47 Temperature (F): 98.3 Height (in): 63 Pulse (bpm): 75 Weight (lbs): 174 Respiratory Rate (breaths/min): 16 Body Mass Index (BMI): 30.8 Blood Pressure (mmHg): 141/78 Reference Range: 80 - 120 mg / dl Electronic Signature(s) Signed: 12/09/2018 2:49:09 PM By: Lorine Bears RCP, RRT, CHT Entered By: Lorine Bears on 12/09/2018 13:51:21

## 2019-04-18 NOTE — Progress Notes (Signed)
[name] ([id number]) Visit Report for 12/09/2018 Chief Complaint Document Details Patient Name: [name], [name]. Date of Service: 12/09/2018 1:00 PM Medical Record Number: [id number] Patient Account Number: [id number] Date of Birth/Sex: 1951-03-08 (68 y.o. F) Treating RN: Harold Barban Primary Care Provider: Crist Infante Other Clinician: Referring Provider: Crist Infante Treating Provider/Extender: Melburn Hake, Maleyah Evans Weeks in Treatment: 12 Information Obtained from: Patient Chief Complaint Right LE Ulcer Electronic Signature(s) Signed: 12/09/2018 11:56:08 PM By: Worthy Keeler PA-C Entered By: Worthy Keeler on 12/09/2018 13:59:36 [name], Joyce J. ([id number]) -------------------------------------------------------------------------------- HPI Details Patient Name: [name] Date of Service: 12/09/2018 1:00 PM Medical Record Number: [id number] Patient Account Number: [id number] Date of Birth/Sex: Dec 29, 1950 (68 y.o. 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 She states that she often has areas like this that will come up but normally not to the severity. She's typically able to take care of these herself. However she's been having a lot of trouble since this past summer in particular. 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 She was most recently treated with this by her primary care provider. Fortunately that seems to have cleared up any infection that she may have had. Nonetheless other pertinent medical history includes the chronic long-term use of Eliquis secondary  to atrial fibrillation. Her ABI appear to be okay today. She did have a DVT 13 years ago and this lag but has not had anything recently. The Eliquis seems to be beneficial in this regard. Otherwise there's no evidence of active infection at this time. No fevers, chills, nausea, or vomiting noted at this time. 09/19/18 on evaluation today patient presents for follow-up concerning her ongoing lower extremity wound. This is her second visit here in our clinic. She does have slough covering the surface of the wound today. Fortunately there is no signs of infection at this time. Overall I have been pleased with the interval change although she still has a lot of Slough I do feel like the Iodoflex was of benefit for her. [name] did have to change her wrap once in the weeks since I last saw her fortunately after that changed nothing seem to smell or drain through. 09/27/18 on evaluation today patient appears to be doing better in regard to her right lateral lower extremity ulcer. She's been tolerating the dressing changes without complication. Fortunately there is no sign of infection at this time. With that being said [name] have had to change the wrap at least once in the interim between when [name] see her and when she comes back in for evaluation due to the amount of drainage an odor. With that being said she is getting very frustrated with the length of time is taken get this to heal. Fortunately there is no evidence of anything worsening and a lot of the slough is improving she has good granulation seems to be peeking out from underneath. She is still having discomfort unfortunately. 10/03/18 on evaluation today patient's wound actually appears to be doing better from the standpoint of feeling in. With that being said she actually does have some slough covering the surface of the wound unfortunately. This is going to  require sharp debridement prior to application of EpiFix. 10/15/18 on evaluation today patient  actually appears to be doing much better in regard to her wound. I do believe that the EpiFix has been of benefit for her which is excellent news. Overall I think that this is something I would definitely recommend continuing I've seen definite progress. 10/22/18 on evaluation today patient actually appears to be doing very well in regard to her leg ulcer. This is shown signs of excellent improvement measurement wise she is continuing to week by week make great progress in my pinion. Overall very pleased in this regard. Fortunately there's no signs of infection at this time. I do believe that the EpiFix has been beneficial for her. 10/29/18 on evaluation today patient actually appears to be doing very well today. The EpiFix does seem to be benefiting her as far as the overall improvement [name] have seen is concerned. Fortunately there's no signs of infection at this time and again her pain is much less than what it used to be. Overall I feel like she's making excellent progress. 11/12/18 on evaluation today patient presents after unfortunately having had a stroke since the last time I seen her until today. She tells me that she's actually doing very well in this regard she did have some rehab in the hospital and then subsequently was discharged to home. She is home at this point. Nonetheless she has physical therapy a potential occupational therapy is gonna be coming out to see her. Nonetheless she tells me that she is not having any evidence at this time of active infection which is good news she's not having any evidence of pain either in regard to the right lateral lower extremity. Overall I feel like she has done well as far as I'm concerned in the EpiFix which did stay in place for longer than the week obviously I think still have good effect based on what I'm seeing at this point the wound is significantly smaller. [name], [name] ([id number]) 11/18/18 on evaluation today patient comes in one day  early for evaluation here in our clinic due to issues that she's having with her right lower extremity she states she was having a lot of discomfort and could not wait any longer to have this checked. Fortunately she did come in because she's actually having what appears to be significant infection which needs to be addressed I did not want this to continue without being addressed. No fevers, chills, nausea, or vomiting noted at this time. 11/26/18 on evaluation today patient will significantly better compared to last week regarding her lower extremity ulcer. Last week she had an infection [name] finally did get the results of the culture back which did show methicillin-resistant Staphylococcus aureus as the positive organism. Subsequently the antibiotic that she's been on has completely resolved the infection as best I can tell she seems to be doing absolutely excellent today. I'm very pleased in this regard. She's having little to no drainage and no pain. Last week and this was very painful. 12/03/18 on evaluation today patient appears to be doing rather well in regard to her lateral lower extremity ulcer on the right. She's been tolerating the dressing changes without complication. Fortunately the wound is shown signs of good improvement this week. The infection is in your much better control and I think were making excellent progress. 12/09/18 on evaluation today patient appears to be doing decently well in regard to her lower Trinity ulcer this point. Fortunately there's no  signs of active infection at this time. Overall I'm very pleased with how things seem to be progressing. With that being said although I did want to see her today to ensure there's no signs of active infection I did want to also see about leaving the EpiFix in place for one additional week before complete removal. Electronic Signature(s) Signed: 12/09/2018 11:56:08 PM By: Worthy Keeler PA-C Entered By: Worthy Keeler on  12/09/2018 14:23:23 [name] ([id number]) -------------------------------------------------------------------------------- Physical Exam Details Patient Name: [name], [name]. Date of Service: 12/09/2018 1:00 PM Medical Record Number: [id number] Patient Account Number: [id number] Date of Birth/Sex: November 19, 1950 (68 y.o. F) Treating RN: Harold Barban Primary Care Provider: Crist Infante Other Clinician: Referring Provider: Crist Infante Treating Provider/Extender: STONE III, Cali Hope Weeks in Treatment: 56 Constitutional Well-nourished and well-hydrated in no acute distress. Respiratory normal breathing without difficulty. clear to auscultation bilaterally. Cardiovascular regular rate and rhythm with normal S1, S2. Psychiatric this patient is able to make decisions and demonstrates good insight into disease process. Alert and Oriented x 3. pleasant and cooperative. Notes 12/09/18 on evaluation today patient appears to be doing decently well in regard to her lower Trinity ulcer this point. Fortunately there's no signs of active infection at this time. Overall I'm very pleased with how things seem to be progressing. With that being said although I did want to see her today to ensure there's no signs of active infection I did want to also see about leaving the EpiFi patient's wound bed again is covered with the EpiFix in place at this time for the most part. There are some areas where as broken down in between but again I would like to continue to allow this to work as much as it could. She's in agreement with this plan. There's no signs of infection and everything else seems to have healed she just has the one original wound site at this point. X in place for one additional week before complete removal. Electronic Signature(s) Signed: 12/09/2018 11:56:08 PM By: Worthy Keeler PA-C Entered By: Worthy Keeler on 12/09/2018 14:24:03 [name]  ([id number]) -------------------------------------------------------------------------------- Physician Orders Details Patient Name: [name] Date of Service: 12/09/2018 1:00 PM Medical Record Number: [id number] Patient Account Number: [id number] Date of Birth/Sex: 07-03-1951 (68 y.o. 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 o May shower with protection. - Please do not get your wrap wet Anesthetic (add to Medication List) Wound #1 Right,Lateral Lower Leg o Topical Lidocaine 4% cream applied to wound bed prior to debridement (In Clinic Only). o Benzocaine Topical Anesthetic Spray applied to wound bed prior to debridement (In Clinic Only). Primary Wound Dressing Wound #1 Right,Lateral Lower Leg o Silver Collagen Secondary Dressing Wound #1 Right,Lateral Lower Leg o ABD pad o Conform/Kerlix - Rolled conform to secure Dressing Change Frequency Wound #1 Right,Lateral Lower Leg o Other: - Change every three days Follow-up Appointments Wound #1 Right,Lateral Lower Leg o Return Appointment in 1 week. Edema Control Wound #1 Right,Lateral Lower Leg o Elevate legs to the level  of the heart and pump ankles as often as possible o Other: [address] JOMARIE, GELLIS ([id number]) Electronic Signature(s) Signed: 12/09/2018 11:56:08 PM By: Worthy Keeler PA-C Signed: 04/18/2019 1:03:35 PM By:  Harold Barban Entered By: Harold Barban on 12/09/2018 14:15:43 [name] ([id number]) -------------------------------------------------------------------------------- Problem List Details Patient Name: GRACELIN, WEISBERG. Date of Service: 12/09/2018 1:00 PM Medical Record Number: [id number] Patient Account Number: [id number] Date of Birth/Sex: 1951-02-04 (68 y.o. 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 09/12/2018 No Yes L97.812 Non-pressure chronic ulcer of other part of right lower leg 09/12/2018 No Yes with fat layer exposed I48.0 Paroxysmal atrial fibrillation 09/12/2018 No Yes I10 Essential (primary) hypertension 09/12/2018 No Yes Z79.01 Long term (current) use of anticoagulants 09/12/2018 No Yes Inactive Problems Resolved Problems Electronic Signature(s) Signed: 12/09/2018 11:56:08 PM By: Worthy Keeler PA-C Entered By: Worthy Keeler on 12/09/2018 13:52:28 [name] ([id number]) -------------------------------------------------------------------------------- Progress Note Details Patient Name: [name] Date of Service: 12/09/2018 1:00 PM Medical Record Number: [id number] Patient Account Number: [id number] Date of Birth/Sex: 11/17/1950 (68 y.o. 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  She states that she often has areas like this that will come up but normally not to the severity. She's typically able to take care of these herself. However she's been having a lot of trouble since this past summer in particular. 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 She was most recently treated with this by her primary care provider. Fortunately that seems to have cleared up any infection that she may have had. Nonetheless other pertinent medical history includes the chronic long-term use of Eliquis secondary to atrial fibrillation. Her ABI appear to be okay today. She did have a DVT 13 years ago and this lag but has not had anything recently. The Eliquis seems to be beneficial in this regard. Otherwise there's no evidence of active infection at this time. No fevers, chills, nausea, or vomiting noted at this time. 09/19/18 on evaluation today patient presents for follow-up concerning her ongoing lower extremity wound. This is her second visit here in our clinic. She does have slough covering the surface of the wound today. Fortunately there is no signs of infection at this time. Overall I have been pleased with the interval change although she still has a lot of Slough I do feel like the Iodoflex was of benefit for her. [name] did have to change her wrap once in the weeks since I last saw her fortunately after that changed nothing seem to smell or drain through. 09/27/18 on evaluation today patient appears to be doing better in regard to her right lateral lower extremity ulcer. She's been tolerating the dressing changes without complication. Fortunately there is no sign of infection at this time. With that being said [name] have had to change the wrap at least once in the interim between when [name] see  her and when she comes back in for evaluation due to the amount of drainage an odor. With that being said she is getting  very frustrated with the length of time is taken get this to heal. Fortunately there is no evidence of anything worsening and a lot of the slough is improving she has good granulation seems to be peeking out from underneath. She is still having discomfort unfortunately. 10/03/18 on evaluation today patient's wound actually appears to be doing better from the standpoint of feeling in. With that being said she actually does have some slough covering the surface of the wound unfortunately. This is going to require sharp debridement prior to application of EpiFix. 10/15/18 on evaluation today patient actually appears to be doing much better in regard to her wound. I do believe that the EpiFix has been of benefit for her which is excellent news. Overall I think that this is something I would definitely recommend continuing I've seen definite progress. 10/22/18 on evaluation today patient actually appears to be doing very well in regard to her leg ulcer. This is shown signs of excellent improvement measurement wise she is continuing to week by week make great progress in my pinion. Overall very pleased in this regard. Fortunately there's no signs of infection at this time. I do believe that the EpiFix has been beneficial for her. 10/29/18 on evaluation today patient actually appears to be doing very well today. The EpiFix does seem to be benefiting her as far as the overall improvement [name] have seen is concerned. Fortunately there's no signs of infection at this time and again her pain is much less than what it used to be. Overall I feel like she's making excellent progress. 11/12/18 on evaluation today patient presents after unfortunately having had a stroke since the last time I seen her until today. [name], [name] ([id number]) She tells me that she's actually doing very well in this regard she did have some rehab in the hospital and then subsequently was discharged to home. She is home at this point.  Nonetheless she has physical therapy a potential occupational therapy is gonna be coming out to see her. Nonetheless she tells me that she is not having any evidence at this time of active infection which is good news she's not having any evidence of pain either in regard to the right lateral lower extremity. Overall I feel like she has done well as far as I'm concerned in the EpiFix which did stay in place for longer than the week obviously I think still have good effect based on what I'm seeing at this point the wound is significantly smaller. 11/18/18 on evaluation today patient comes in one day early for evaluation here in our clinic due to issues that she's having with her right lower extremity she states she was having a lot of discomfort and could not wait any longer to have this checked. Fortunately she did come in because she's actually having what appears to be significant infection which needs to be addressed I did not want this to continue without being addressed. No fevers, chills, nausea, or vomiting noted at this time. 11/26/18 on evaluation today patient will significantly better compared to last week regarding her lower extremity ulcer. Last week she had an infection [name] finally did get the results of the culture back which did show methicillin-resistant Staphylococcus aureus as the positive organism. Subsequently the antibiotic that she's been on has completely resolved the infection as best I can tell  she seems to be doing absolutely excellent today. I'm very pleased in this regard. She's having little to no drainage and no pain. Last week and this was very painful. 12/03/18 on evaluation today patient appears to be doing rather well in regard to her lateral lower extremity ulcer on the right. She's been tolerating the dressing changes without complication. Fortunately the wound is shown signs of good improvement this week. The infection is in your much better control and I think were  making excellent progress. 12/09/18 on evaluation today patient appears to be doing decently well in regard to her lower Trinity ulcer this point. Fortunately there's no signs of active infection at this time. Overall I'm very pleased with how things seem to be progressing. With that being said although I did want to see her today to ensure there's no signs of active infection I did want to also see about leaving the EpiFix in place for one additional week before complete removal. Patient History Information obtained from Patient. Family History Cancer - Father, Diabetes - Maternal Grandparents, Lung Disease - Father, Stroke - Father, No family history of Heart Disease, Hereditary Spherocytosis, Hypertension, Kidney Disease, Seizures, Thyroid Problems, Tuberculosis. Social History Never smoker, Marital Status - Married, Alcohol Use - Never, Drug Use - No History, Caffeine Use - Daily. Medical History Eyes Denies history of Cataracts, Glaucoma, Optic Neuritis Ear/Nose/Mouth/Throat Denies history of Chronic sinus problems/congestion, Middle ear problems Hematologic/Lymphatic Denies history of Anemia, Hemophilia, Human Immunodeficiency Virus, Sickle Cell Disease Respiratory Denies history of Aspiration, Asthma, Chronic Obstructive Pulmonary Disease (COPD), Pneumothorax, Sleep Apnea, Tuberculosis Cardiovascular Patient has history of Arrhythmia - a fib, Deep Vein Thrombosis - 13 years ago, Hypertension Denies history of Angina, Congestive Heart Failure, Coronary Artery Disease, Hypotension, Myocardial Infarction, Peripheral Arterial Disease, Peripheral Venous Disease, Phlebitis, Vasculitis Gastrointestinal Denies history of Cirrhosis , Colitis, Crohn s, Hepatitis A, Hepatitis B, Hepatitis C Endocrine Denies history of Type I Diabetes, Type II Diabetes Genitourinary Denies history of End Stage Renal Disease Immunological [name], [name] ([id number]) Denies history of Lupus Erythematosus,  Raynaud s, Scleroderma Integumentary (Skin) Denies history of History of Burn, History of pressure wounds Musculoskeletal Patient has history of Osteoarthritis Denies history of Gout, Rheumatoid Arthritis, Osteomyelitis Neurologic Denies history of Dementia, Neuropathy, Quadriplegia, Paraplegia, Seizure Disorder Oncologic Patient has history of Received Chemotherapy - past for Wegeners Denies history of Received Radiation Psychiatric Denies history of Anorexia/bulimia, Confinement Anxiety Medical And Surgical History Notes Immunological Wegeners Musculoskeletal OP Review of Systems (ROS) Constitutional Symptoms (General Health) Denies complaints or symptoms of Fatigue, Fever, Chills, Marked Weight Change. Respiratory Denies complaints or symptoms of Chronic or frequent coughs, Shortness of Breath. Cardiovascular Denies complaints or symptoms of Chest pain, LE edema. Psychiatric Denies complaints or symptoms of Anxiety, Claustrophobia. Objective Constitutional Well-nourished and well-hydrated in no acute distress. Vitals Time Taken: 1:47 PM, Height: 63 in, Weight: 174 lbs, BMI: 30.8, Temperature: 98.3 F, Pulse: 75 bpm, Respiratory Rate: 16 breaths/min, Blood Pressure: 141/78 mmHg. Respiratory normal breathing without difficulty. clear to auscultation bilaterally. Cardiovascular regular rate and rhythm with normal S1, S2. Psychiatric this patient is able to make decisions and demonstrates good insight into disease process. Alert and Oriented x 3. pleasant and cooperative. General Notes: 12/09/18 on evaluation today patient appears to be doing decently well in regard to her lower Trinity ulcer this [name], [name]. ([id number]) point. Fortunately there's no signs of active infection at this time. Overall I'm very pleased with how things seem to be progressing. With that  being said although I did want to see her today to ensure there's no signs of active infection I did want to  also see about leaving the EpiFi patient's wound bed again is covered with the EpiFix in place at this time for the most part. There are some areas where as broken down in between but again I would like to continue to allow this to work as much as it could. She's in agreement with this plan. There's no signs of infection and everything else seems to have healed she just has the one original wound site at this point. X in place for one additional week before complete removal. Integumentary (Hair, Skin) Wound #1 status is Open. Original cause of wound was Bump. The wound is located on the Right,Lateral Lower Leg. The wound measures 1.4cm length x 1.2cm width x 0.3cm depth; 1.319cm^2 area and 0.396cm^3 volume. There is Fat Layer (Subcutaneous Tissue) Exposed exposed. There is no tunneling or undermining noted. There is a small amount of serous drainage noted. The wound margin is flat and intact. There is small (1-33%) pink, hyper - granulation within the wound bed. There is a large (67-100%) amount of necrotic tissue within the wound bed including Adherent Slough. The periwound skin appearance exhibited: Excoriation, Induration, Ecchymosis, Erythema. The periwound skin appearance did not exhibit: Callus, Crepitus, Rash, Scarring, Dry/Scaly, Maceration, Atrophie Blanche, Cyanosis, Hemosiderin Staining, Mottled, Pallor, Rubor. The surrounding wound skin color is noted with erythema which is circumferential. Periwound temperature was noted as No Abnormality. The periwound has tenderness on palpation. 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 May shower with protection. - Please do  not get your wrap wet Anesthetic (add to Medication List): Wound #1 Right,Lateral Lower Leg: Topical Lidocaine 4% cream applied to wound bed prior to debridement (In Clinic Only). Benzocaine Topical Anesthetic Spray applied to wound bed prior to debridement (In Clinic Only). Primary Wound Dressing: Wound #1 Right,Lateral Lower Leg: Silver Collagen Secondary Dressing: Wound #1 Right,Lateral Lower Leg: ABD pad Conform/Kerlix - Rolled conform to secure Dressing Change Frequency: Wound #1 Right,Lateral Lower Leg: Other: - Change every three days Follow-up Appointments: [name], [name] ([id number]) Wound #1 Right,Lateral Lower Leg: Return Appointment in 1 week. Edema Control: Wound #1 Right,Lateral Lower Leg: Elevate legs to the level of the heart and pump ankles as often as possible Other: - Tubi Grip E My suggestion at this point is gonna be that [name] go ahead and continue with the above wound care measures for the next week and the patient is in agreement with plan. Subsequently will see were things stand at follow-up. She will be changing this home which I think she does very well with I think she can take a shower as well when she performs the first dressing change in three days. Otherwise will see were things stand at follow-up. Please see above for specific wound care orders. [name] will see patient for re-evaluation in 1 week(s) here in the clinic. If anything worsens or changes patient will contact our office for additional recommendations. Electronic Signature(s) Signed: 12/09/2018 11:56:08 PM By: Worthy Keeler PA-C Entered By: Worthy Keeler on 12/09/2018 14:24:37 [name] ([id number]) -------------------------------------------------------------------------------- ROS/PFSH Details Patient Name: [name], [name]. Date of Service: 12/09/2018 1:00 PM Medical  Record Number: [id number] Patient Account Number: [id number] Date of Birth/Sex: 12-24-1950 (68 y.o. F) Treating RN:  Harold Barban Primary Care Provider: Crist Infante Other Clinician: Referring Provider: Crist Infante Treating Provider/Extender: Melburn Hake, Keerstin Bjelland Weeks in Treatment: 12 Information Obtained From Patient Constitutional Symptoms (General Health) Complaints and Symptoms: Negative for: Fatigue; Fever; Chills; Marked Weight Change Respiratory Complaints and Symptoms: Negative for: Chronic or frequent coughs; Shortness of Breath Medical History: Negative for: Aspiration; Asthma; Chronic Obstructive Pulmonary Disease (COPD); Pneumothorax; Sleep Apnea; Tuberculosis Cardiovascular Complaints and Symptoms: Negative for: Chest pain; LE edema Medical History: Positive for: Arrhythmia - a fib; Deep Vein Thrombosis - 13 years ago; Hypertension Negative for: Angina; Congestive Heart Failure; Coronary Artery Disease; Hypotension; Myocardial Infarction; Peripheral Arterial Disease; Peripheral Venous Disease; Phlebitis; Vasculitis Psychiatric Complaints and Symptoms: Negative for: Anxiety; Claustrophobia Medical History: Negative for: Anorexia/bulimia; Confinement Anxiety Eyes Medical History: Negative for: Cataracts; Glaucoma; Optic Neuritis Ear/Nose/Mouth/Throat Medical History: Negative for: Chronic sinus problems/congestion; Middle ear problems Hematologic/Lymphatic Medical History: Negative for: Anemia; Hemophilia; Human Immunodeficiency Virus; Sickle Cell Disease [name], [name] ([id number]) Gastrointestinal Medical History: Negative for: Cirrhosis ; Colitis; Crohnos; Hepatitis A; Hepatitis B; Hepatitis C Endocrine Medical History: Negative for: Type I Diabetes; Type II Diabetes Genitourinary Medical History: Negative for: End Stage Renal Disease Immunological Medical History: Negative for: Lupus Erythematosus; Raynaudos; Scleroderma Past Medical History Notes: Wegeners Integumentary (Skin) Medical History: Negative for: History of Burn; History of pressure  wounds Musculoskeletal Medical History: Positive for: Osteoarthritis Negative for: Gout; Rheumatoid Arthritis; Osteomyelitis Past Medical History Notes: OP Neurologic Medical History: Negative for: Dementia; Neuropathy; Quadriplegia; Paraplegia; Seizure Disorder Oncologic Medical History: Positive for: Received Chemotherapy - past for Wegeners Negative for: Received Radiation Immunizations Pneumococcal Vaccine: Received Pneumococcal Vaccination: Yes Implantable Devices No devices added Family and Social History Cancer: Yes - Father; Diabetes: Yes - Maternal Grandparents; Heart Disease: No; Hereditary Spherocytosis: No; Hypertension: No; Kidney Disease: No; Lung Disease: Yes - Father; Seizures: No; Stroke: Yes - Father; Thyroid Problems: No; Tuberculosis: No; Never smoker; Marital Status - Married; Alcohol Use: Never; Drug Use: No History; Caffeine Use: Daily; Financial Concerns: No; Food, Clothing or Shelter Needs: No; Support System Lacking: No; Transportation Concerns: No [name], WEIHER ([id number]) Physician Affirmation I have reviewed and agree with the above information. Electronic Signature(s) Signed: 12/09/2018 11:56:08 PM By: Worthy Keeler PA-C Signed: 04/18/2019 1:03:35 PM By: Harold Barban Entered By: Worthy Keeler on 12/09/2018 14:23:45 Corradi, Joyce Lenna Sciara ([id number]) -------------------------------------------------------------------------------- SuperBill Details Patient Name: ARYANAH, ENSLOW. Date of Service: 12/09/2018 Medical Record Number: [id number] Patient Account Number: [id number] Date of Birth/Sex: 1951/04/15 (68 y.o. 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 L97.812 Non-pressure chronic ulcer of other part  of right lower leg with fat layer exposed I48.0 Paroxysmal atrial fibrillation I10 Essential (primary) hypertension Z79.01 Long term (current) use of anticoagulants Facility Procedures CPT4 Code: 20254270 Description: 62376 - WOUND CARE VISIT-LEV 2 EST PT Modifier: Quantity: 1 Physician Procedures CPT4 Code Description: 2831517 99214 - WC PHYS LEVEL 4 - EST PT ICD-10 Diagnosis Description 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 

## 2019-04-22 DIAGNOSIS — D0471 Carcinoma in situ of skin of right lower limb, including hip: Secondary | ICD-10-CM | POA: Diagnosis not present

## 2019-04-29 ENCOUNTER — Ambulatory Visit: Payer: Medicare Other | Admitting: Adult Health

## 2019-04-29 ENCOUNTER — Telehealth: Payer: Self-pay

## 2019-04-29 NOTE — Progress Notes (Deleted)
[facility] [address] [place]. [place] [address] [phone]       OFFICE FOLLOW UP NOTE  [name] Date of Birth:  [birth year] Medical Record Number:  [id number]   Reason for visit: f/u left CR stroke 10/2018    CHIEF COMPLAINT:  No chief complaint on file.   HPI: Stroke admission 11/03/2018: [name] is a 69 y.o. female with history of PE on Eliquis, OSA, HTN, HLD  who presented with R sided weakness.  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  Vessel imaging unremarkable.  2D echo unremarkable.  Lower extremity venous Dopplers negative.  LDL 59 A1c 5.3.  Recommended continuation of Eliquis along with additional aspirin 81 mg.  Continuation of current statin. HTN stable.  She was discharged in stable condition with recommendations of outpatient therapy.  Initial visit via virtual visit 01/22/2019: Residual balance deficits with underlying balance difficulties (slightly worsened from baseline difficulties). Recent flair of Wegners and her rheumatologist believes this is what could have caused her stroke. Recently received 2 chemo infusions and will have a repeat in 6 months. Completed therapy but interested in doing outpatient therapy. Dr. Letta Pate placed referral to neuro rehab PT. She has not been called at this time to schedule visit. She is able to walk without assistive device without any falls.  She does have underlying history of balance difficulties and only slightly worsened since stroke.  She is questioning return to driving.  She denies any dizziness/vertigo.  Eliquis and aspirin with mild bruising but no bleeding. BP typically 120s/70s. Continues on lipitor without myalgias. Recent lipid panel on Monday with satisfactory lipid panel. No concerns today. Denies new or worsening stroke/TIA symptoms.   Update 04/29/2019: [name] is  being seen today for stroke follow-up.  She has been stable from a neurological standpoint ***.  She did have admission for upper GI bleed on 03/03/2019 secondary to large gastric ulcer.  Recommended restarting aspirin at discharge and restart Eliquis after 1 week.  She has continued on aspirin and Eliquis without additional bleeding.  Continues on atorvastatin without myalgias.  Blood pressure today ***.  No further concerns at this time.    ROS:   14 system review of systems performed and negative with exception of balance difficulties  PMH:  Past Medical History:  Diagnosis Date  . Allergic rhinitis   . 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  No definite vasculitis BM bx not c/w lymphoma   . Atrial fibrillation with RVR ([facility])   . Cameron lesion, chronic   . Cervical polyp 03/2003  . DDD (degenerative disc disease)   . Depression    Dr. Toy Care (situational, divorce, loss of parents)  . Diverticulosis   . DVT (deep venous thrombosis) ([facility])    both legs  . Esophageal stricture   . GERD (gastroesophageal reflux disease)   . 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 Ginger Organ at [facility]  . Hearing loss   . Hiatal hernia - ? paraesophageal   . Hyperlipidemia   . Hypertension   . Iron deficiency anemia due to chronic blood loss - hiatal hernia with Cameron's lesions    recurrent iron defic.  . Lacunar infarct, acute ([facility]) 11/05/2018  . Long term (current) use of systemic steroids   .  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 Neuropathy, peripheral   . OSA (obstructive sleep apnea)   . Osteoarthritis   . Osteopenia   . Overactive bladder   . Pulmonary embolism ([facility])    left lung  . Ulcer of lower limb ([facility]) 01/02/2014    PSH:  Past Surgical History:  Procedure Laterality Date  . BIOPSY  03/04/2019   Procedure:  BIOPSY;  Surgeon: Gatha Mayer, MD;  Location[facility] END[facility]COPY;  Service: Endoscopy;;  . BRONCHOSCOPY  april 2012  . COLONOSCOPY    . ESOPHAGOGASTRODUODENOSCOPY    . ESOPHAGOGASTRODUODENOSCOPY (EGD) WITH PROPOFOL N/A 03/04/2019   Procedure: ESOPHAGOGASTRODUODENOSCOPY (EGD) WITH PROPOFOL;  Surgeon: Gatha Mayer, MD;  Location: [facility];  Service: Endoscopy;  Laterality: N/A;  . IVC FILTER INSERTION    . LUMBAR EPIDURAL INJECTION     steroid injection x2  . LUNG BIOPSY  may 2012  . PARTIAL HIP ARTHROPLASTY Left 03/2014    Social History:  Social History   Socioeconomic History  . Marital status: Married    Spouse name: Not on file  . Number of children: N  . Years of education: Not on file  . Highest education level: Not on file  Occupational History  . 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 Financial resource strain: Not on file  . Food insecurity    Worry: Not on file    Inability: Not on file  . Transportation needs    Medical: Not on file    Non-medical: Not on file  Tobacco Use  . Smoking status: Never Smoker  . Smokeless tobacco: Never Used  Substance and Sexual Activity  . Alcohol use: No    Comment: rare  . Drug use: No  . Sexual activity: Yes    Partners: Male    Birth control/protection: Post-menopausal  Lifestyle  . Physical activity    Days per week: Not on file    Minutes per session: Not on file  . Stress: Not on file  Relationships  . Social Herbalist on phone: Not on file    Gets together: Not on file    Attends religious service: Not on file    Active member of club or organization: Not on file    Attends meetings of clubs or organizations: Not on file    Relationship status: Not on file  . Intimate partner violence    Fear of current or ex partner: Not on file    Emotionally abused: Not on file    Physically abused: Not on file    Forced sexual  activity: Not on file  Other Topics Concern  . Not on file  Social History Narrative   Disabled NICU RN   Married   Rare EtOH   Non-smoker and no drugs    Family History:  Family History  Problem Relation Age of Onset  . Hypertension Mother   . Neuropathy Mother   . Varicose Veins Mother   . Stroke Father   . Lung cancer Father   . Arthritis Father   . Cancer Father   . Deep vein thrombosis Father   . Hypertension Father   . Varicose Veins Father   . Diabetes Maternal Grandfather   . Diabetes Maternal Uncle   . Diabetes Maternal Aunt   . Hypertension Sister   . Colon cancer Neg Hx     Medications:  Current Outpatient Medications on File Prior to Visit  Medication Sig Dispense Refill  . acetaminophen (TYLENOL) 325 MG tablet Take 650 mg by mouth as needed for mild pain.     Marland Kitchen apixaban (ELIQUIS) 5 MG TABS tablet Take 1 tablet (5 mg total) by mouth 2 (two) times daily. 60 tablet 0  . aspirin EC 81 MG EC tablet Take 1 tablet (81 mg total) by mouth daily. 30 tablet 0  . atorvastatin (LIPITOR) 40 MG tablet Take 1 tablet (40 mg total) by mouth daily at 6 PM. 30 tablet 0  . calcium carbonate (OS-CAL) 600 MG TABS tablet Take 600 mg by mouth daily.     . Cholecalciferol (VITAMIN D PO) Take 1 tablet by mouth 2 (two) times daily.     . DULoxetine (CYMBALTA) 60 MG capsule Take 1 capsule (60 mg total) by mouth at bedtime. 30 capsule 3  . fluticasone (FLONASE) 50 MCG/ACT nasal spray Place 1 spray into both nostrils 2 (two) times daily as needed for congestion.    . gabapentin (NEURONTIN) 600 MG tablet Take 1 tablet (600 mg total) by mouth 2 (two) times daily. 60 tablet 0  . hydrochlorothiazide (MICROZIDE) 12.5 MG capsule Take 1 capsule by mouth daily.    . hydroxyurea (HYDREA) 500 MG capsule Take 500-1,000 mg by mouth See admin instructions. 1000mg  in the morning and 500mg  at night    . lisinopril (PRINIVIL,ZESTRIL) 10 MG tablet Take 1 tablet (10 mg total) by mouth daily. (Patient taking  differently: Take 20 mg by mouth daily. ) 30 tablet 0  . LORazepam (ATIVAN) 0.5 MG tablet Take 1 tablet (0.5 mg total) by mouth daily as needed for anxiety. 20 tablet 0  . Multiple Vitamin (MULTIVITAMIN) tablet Take 1 tablet by mouth daily.      Marland Kitchen oxyCODONE-acetaminophen (PERCOCET/ROXICET) 5-325 MG tablet Take 1 tablet by mouth every 6 (six) hours as needed. 20 tablet 0  . pantoprazole (PROTONIX) 40 MG tablet Take 1 tablet (40 mg total) by mouth 2 (two) times daily. 180 tablet 3   No current facility-administered medications on file prior to visit.     Allergies:   Allergies  Allergen Reactions  . Latex Rash    Sensitive to latex, rash Sensitive to latex, rash  . Other Itching    Adhesive tape causes rash Adhesive tape causes rash  . Pregabalin Swelling    Swelling of hands and feet  . Codeine Itching    REACTION: itching REACTION: itching     Physical Exam  Depression screen [facility] 2/9 02/25/2019  Decreased Interest 0  Down, Depressed, Hopeless 0  PHQ - 2 Score 0     General: well developed, well nourished, pleasant middle-age Caucasian female, seated, in no evident distress Head: head normocephalic and atraumatic.     Neurologic Exam Mental Status: Awake and fully alert. Oriented to place and time. Recent and remote memory intact. Attention span, concentration and fund of knowledge appropriate. Mood and affect appropriate.  Cranial Nerves: Extraocular movements full without nystagmus. Hearing intact to voice. Facial sensation intact. Face, tongue, palate moves normally and symmetrically.  Motor: No evidence of weakness per drift assessment. Sensory.: intact to light touch Coordination: Rapid alternating movements normal in all extremities. Finger-to-nose and heel-to-shin performed accurately bilaterally. Gait and Station: Arises from chair without difficulty. Stance is normal. Gait demonstrates normal stride length and balance.  Reflexes: 1+ and symmetric. Toes downgoing.     NIHSS  0 Modified Rankin  1  ASSESSMENT: [name] is a 68 y.o. 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 Vascular risk factors include PE on Eliquis, OSA, HTN, HLD and Wegener's vasculitis.     PLAN:  1. Left corona radiata/lentiform nucleus infarct: Continue aspirin 81 mg daily and Eliquis (apixaban) daily  and lipitor  for secondary stroke prevention. Maintain strict control of hypertension with blood pressure goal below 130/90, diabetes with hemoglobin A1c goal below 6.5% and cholesterol with LDL cholesterol (bad cholesterol) goal below 70 mg/dL.  I also advised the patient to eat a healthy diet with plenty of whole grains, cereals, fruits and vegetables, exercise regularly with at least 30 minutes of continuous activity daily and maintain ideal body weight. 2. Wegener's vasculitis: Ongoing follow-up with rheumatology for management/monitoring 3. Balance deficit: only while ambulating. Balance slightly worsened compared to baseline.  Provided patient with neuro rehab phone number to call and schedule appointment. 4. HTN: Advised to continue current treatment regimen.  Advised to continue to monitor at home along with continued follow-up with PCP for management 5. HLD: Advised to continue current treatment regimen along with continued follow-up with PCP for future prescribing and monitoring of lipid panel 6. As she only has minimal residual deficits consisting of balance difficulty while ambulating, she is cleared to return to driving but did advise to have family member ride with her for the first few times and to avoid main roads, highways and nighttime driving.    Follow up in 3 months or call earlier if needed   Greater than 50% of time during this 30 minute non-face-to-face visit was spent on counseling, explanation of diagnosis of stroke, reviewing risk factor  management of Wegener's vasculitis, HTN, HLD, planning of further management along with potential future management, and discussion with patient and family answering all questions.    Frann Rider, AGNP-BC  [facility] [address] [place], [place] [address]  Phone [phone] Fax [phone] Note: This document was prepared with digital dictation and possible smart phrase technology. Any transcriptional errors that result from this process are unintentional.

## 2019-04-29 NOTE — Telephone Encounter (Signed)
Patient was a no call/no show for their appointment today.   

## 2019-04-30 ENCOUNTER — Encounter: Payer: Self-pay | Admitting: Adult Health

## 2019-05-01 ENCOUNTER — Encounter: Payer: Self-pay | Admitting: Adult Health

## 2019-05-01 ENCOUNTER — Other Ambulatory Visit: Payer: Self-pay

## 2019-05-01 ENCOUNTER — Ambulatory Visit (INDEPENDENT_AMBULATORY_CARE_PROVIDER_SITE_OTHER): Payer: Medicare Other | Admitting: Adult Health

## 2019-05-01 VITALS — BP 115/69 | HR 77 | Temp 97.3°F | Ht 63.0 in | Wt 177.4 lb

## 2019-05-01 DIAGNOSIS — I10 Essential (primary) hypertension: Secondary | ICD-10-CM

## 2019-05-01 DIAGNOSIS — E782 Mixed hyperlipidemia: Secondary | ICD-10-CM

## 2019-05-01 DIAGNOSIS — R2689 Other abnormalities of gait and mobility: Secondary | ICD-10-CM

## 2019-05-01 DIAGNOSIS — I6381 Other cerebral infarction due to occlusion or stenosis of small artery: Secondary | ICD-10-CM

## 2019-05-01 NOTE — Patient Instructions (Signed)
Continue aspirin 81 mg daily and Eliquis (apixaban) daily  and lipitor  for secondary stroke prevention  Continue to follow up with PCP regarding cholesterol and blood pressure management   Call neuro rehab to schedule physical therapy sessions [phone]  Continue to monitor blood pressure at home  Maintain strict control of hypertension with blood pressure goal below 130/90, diabetes with hemoglobin A1c goal below 6.5% and cholesterol with LDL cholesterol (bad cholesterol) goal below 70 mg/dL. I also advised the patient to eat a healthy diet with plenty of whole grains, cereals, fruits and vegetables, exercise regularly and maintain ideal body weight.         Thank you for coming to see [place] at [facility]. I hope we have been able to provide you high quality care today.  You may receive a patient satisfaction survey over the next few weeks. We would appreciate your feedback and comments so that we may continue to improve ourselves and the health of our patients.

## 2019-05-01 NOTE — Progress Notes (Signed)
I agree with the above plan 

## 2019-05-01 NOTE — Progress Notes (Signed)
[facility] [address] [place]. [place] [address] [phone]       OFFICE FOLLOW UP NOTE  [name] Date of Birth:  1950/11/06 Medical Record Number:  [id number]   Reason for visit: f/u left CR stroke 10/2018 [facility] provider: Dr. Leonie Man   CHIEF COMPLAINT:  Chief Complaint  Patient presents with  . Follow-up    3 mon f/u. Alone. Rm 9. Patient mentioned that she has been having issues with her walking/gait. She also mentioned that has been feeling fatigued lately.     HPI: Stroke admission 11/03/2018: [name] is a 68 y.o. female with history of PE on Eliquis, OSA, HTN, HLD  who presented with R sided weakness.  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  Vessel imaging unremarkable.  2D echo unremarkable.  Lower extremity venous Dopplers negative.  LDL 59 A1c 5.3.  Recommended continuation of Eliquis along with additional aspirin 81 mg.  Continuation of current statin. HTN stable.  She was discharged in stable condition with recommendations of outpatient therapy.  Initial visit via virtual visit 01/22/2019: Residual balance deficits with underlying balance difficulties (slightly worsened from baseline difficulties). Recent flair of Wegners and her rheumatologist believes this is what could have caused her stroke. Recently received 2 chemo infusions and will have a repeat in 6 months. Completed therapy but interested in doing outpatient therapy. Dr. Letta Pate placed referral to neuro rehab PT. She has not been called at this time to schedule visit. She is able to walk without assistive device without any falls.  She does have underlying history of balance difficulties and only slightly worsened since stroke.  She is questioning return to driving.  She denies any dizziness/vertigo.  Eliquis and aspirin with mild bruising but no bleeding. BP  typically 120s/70s. Continues on lipitor without myalgias. Recent lipid panel on Monday with satisfactory lipid panel. No concerns today. Denies new or worsening stroke/TIA symptoms.   Update 05/01/2019: [name] is being seen today for stroke follow-up.  She continues to have balance difficulties but believes balance is at her baseline and not worsened from her stroke.  She was referred to PT by Dr. Letta Pate for improvement of her balance on 02/25/2019 but has not been called for initial evaluation at this time.  She did have admission for upper GI bleed on 03/03/2019 secondary to large gastric ulcer.  Recommended restarting aspirin at discharge and restart Eliquis after 1 week.  She has continued on aspirin and Eliquis without additional bleeding.  Continues on atorvastatin without myalgias.  Blood pressure today satisfactory at 115/69.  No further concerns at this time.    ROS:   14 system review of systems performed and negative with exception of balance difficulties  PMH:  Past Medical History:  Diagnosis Date  . Allergic rhinitis   . 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  No definite vasculitis BM bx not c/w lymphoma   . Atrial fibrillation with RVR ([place])   . Cameron lesion, chronic   . Cervical polyp 03/2003  . DDD (degenerative disc disease)   . Depression    Dr. Toy Care (situational, divorce, loss of parents)  . Diverticulosis   . DVT (deep venous thrombosis) ([facility])    both legs  . Esophageal stricture   . GERD (gastroesophageal reflux disease)   .  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 Ginger Organ at [facility]  . Hearing loss   . Hiatal hernia - ? paraesophageal   . Hyperlipidemia   . Hypertension   . Iron deficiency anemia due to chronic blood loss - hiatal hernia with Cameron's lesions    recurrent iron defic.  . Lacunar infarct,  acute ([place] [facility]) 11/05/2018  . Long term (current) use of systemic steroids   . 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 Neuropathy, peripheral   . OSA (obstructive sleep apnea)   . Osteoarthritis   . Osteopenia   . Overactive bladder   . Pulmonary embolism ([facility])    left lung  . Ulcer of lower limb ([facility]) 01/02/2014    PSH:  Past Surgical History:  Procedure Laterality Date  . BIOPSY  03/04/2019   Procedure: BIOPSY;  Surgeon: Gatha Mayer, MD;  Location: [facility] ENDOSCOPY;  Service: Endoscopy;;  . BRONCHOSCOPY  april 2012  . COLONOSCOPY    . ESOPHAGOGASTRODUODENOSCOPY    . ESOPHAGOGASTRODUODENOSCOPY (EGD) WITH PROPOFOL N/A 03/04/2019   Procedure: ESOPHAGOGASTRODUODENOSCOPY (EGD) WITH PROPOFOL;  Surgeon: Gatha Mayer, MD;  Location: [facility];  Service: Endoscopy;  Laterality: N/A;  . IVC FILTER INSERTION    . LUMBAR EPIDURAL INJECTION     steroid injection x2  . LUNG BIOPSY  may 2012  . PARTIAL HIP ARTHROPLASTY Left 03/2014    Social History:  Social History   Socioeconomic History  . Marital status: Married    Spouse name: Not on file  . Number of children: N  . Years of education: Not on file  . Highest education level: Not on file  Occupational History  . 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 Financial resource strain: Not on file  . Food insecurity    Worry: Not on file    Inability: Not on file  . Transportation needs    Medical: Not on file    Non-medical: Not on file  Tobacco Use  . Smoking status: Never Smoker  . Smokeless tobacco: Never Used  Substance and Sexual Activity  . Alcohol use: No    Comment: rare  . Drug use: No  . Sexual activity: Yes    Partners: Male    Birth control/protection: Post-menopausal  Lifestyle  . Physical activity    Days per week: Not on file    Minutes per session: Not on file  . Stress: Not on file  Relationships  . Social  Herbalist on phone: Not on file    Gets together: Not on file    Attends religious service: Not on file    Active member of club or organization: Not on file    Attends meetings of clubs or organizations: Not on file    Relationship status: Not on file  . Intimate partner violence    Fear of current or ex partner: Not on file    Emotionally abused: Not on file    Physically abused: Not on file    Forced sexual activity: Not on file  Other Topics Concern  . Not on file  Social History Narrative   Disabled NICU RN   Married   Rare EtOH   Non-smoker and no drugs    Family History:  Family History  Problem Relation Age of Onset  . Hypertension Mother   . Neuropathy Mother   .  Varicose Veins Mother   . Stroke Father   . Lung cancer Father   . Arthritis Father   . Cancer Father   . Deep vein thrombosis Father   . Hypertension Father   . Varicose Veins Father   . Diabetes Maternal Grandfather   . Diabetes Maternal Uncle   . Diabetes Maternal Aunt   . Hypertension Sister   . Colon cancer Neg Hx     Medications:   Current Outpatient Medications on File Prior to Visit  Medication Sig Dispense Refill  . acetaminophen (TYLENOL) 325 MG tablet Take 650 mg by mouth as needed for mild pain.     Marland Kitchen apixaban (ELIQUIS) 5 MG TABS tablet Take 1 tablet (5 mg total) by mouth 2 (two) times daily. 60 tablet 0  . aspirin EC 81 MG EC tablet Take 1 tablet (81 mg total) by mouth daily. 30 tablet 0  . atorvastatin (LIPITOR) 40 MG tablet Take 1 tablet (40 mg total) by mouth daily at 6 PM. 30 tablet 0  . calcium carbonate (OS-CAL) 600 MG TABS tablet Take 600 mg by mouth daily.     . Cholecalciferol (VITAMIN D PO) Take 1 tablet by mouth 2 (two) times daily.     . DULoxetine (CYMBALTA) 60 MG capsule Take 1 capsule (60 mg total) by mouth at bedtime. 30 capsule 3  . fluticasone (FLONASE) 50 MCG/ACT nasal spray Place 1 spray into both nostrils 2 (two) times daily as needed for congestion.     . gabapentin (NEURONTIN) 600 MG tablet Take 1 tablet (600 mg total) by mouth 2 (two) times daily. 60 tablet 0  . hydrochlorothiazide (MICROZIDE) 12.5 MG capsule Take 1 capsule by mouth daily.    . hydroxyurea (HYDREA) 500 MG capsule Take 500-1,000 mg by mouth See admin instructions. 1000mg  in the morning and 500mg  at night    . lisinopril (PRINIVIL,ZESTRIL) 10 MG tablet Take 1 tablet (10 mg total) by mouth daily. (Patient taking differently: Take 20 mg by mouth daily. ) 30 tablet 0  . LORazepam (ATIVAN) 0.5 MG tablet Take 1 tablet (0.5 mg total) by mouth daily as needed for anxiety. 20 tablet 0  . Multiple Vitamin (MULTIVITAMIN) tablet Take 1 tablet by mouth daily.      Marland Kitchen oxyCODONE-acetaminophen (PERCOCET/ROXICET) 5-325 MG tablet Take 1 tablet by mouth every 6 (six) hours as needed. 20 tablet 0  . pantoprazole (PROTONIX) 40 MG tablet Take 1 tablet (40 mg total) by mouth 2 (two) times daily. 180 tablet 3  . vitamin B-12 (CYANOCOBALAMIN) 1000 MCG tablet Take 1,000 mcg by mouth daily.     No current facility-administered medications on file prior to visit.     Allergies:   Allergies  Allergen Reactions  . Latex Rash    Sensitive to latex, rash Sensitive to latex, rash  . Other Itching    Adhesive tape causes rash Adhesive tape causes rash  . Pregabalin Swelling    Swelling of hands and feet  . Codeine Itching    REACTION: itching REACTION: itching    Today's Vitals   05/01/19 0727  BP: 115/69  Pulse: 77  Temp: (!) 97.3 F (36.3 C)  TempSrc: Oral  Weight: 177 lb 6.4 oz (80.5 kg)  Height: 5\' 3"  (1.6 m)   Body mass index is 31.42 kg/m.    Physical Exam  General: well developed, well nourished, pleasant middle-age Caucasian female, seated, in no evident distress Head: head normocephalic and atraumatic.   Neck: supple with no  carotid or supraclavicular bruits Cardiovascular: regular rate and rhythm, no murmurs Musculoskeletal: no deformity Skin:  no  rash/petichiae Vascular:  Normal pulses all extremities   Neurologic Exam Mental Status: Awake and fully alert. Oriented to place and time. Recent and remote memory intact. Attention span, concentration and fund of knowledge appropriate. Mood and affect appropriate.  Cranial Nerves: Fundoscopic exam reveals sharp disc margins. Pupils equal, briskly reactive to light. Extraocular movements full without nystagmus. Visual fields full to confrontation. Hearing intact. Facial sensation intact. Face, tongue, palate moves normally and symmetrically.  Motor: Normal bulk and tone. Normal strength in all tested extremity muscles. Sensory.: intact to touch , pinprick , position and vibratory sensation.  Coordination: Rapid alternating movements normal in all extremities. Finger-to-nose and heel-to-shin performed accurately bilaterally. Gait and Station: Arises from chair with mild difficulty. Stance is normal.  Abnormal gait with mild imbalance but is able to ambulate without assistive device.  Romberg negative.  Unable to perform tandem gait. Reflexes: 1+ and symmetric. Toes downgoing.       ASSESSMENT: [name] is a 68 y.o. 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 Vascular risk factors include PE on Eliquis, OSA, HTN, HLD and Wegener's vasculitis.  Recovered well from a stroke standpoint without residual deficits.  She does continue to have balance difficulties but this has been ongoing prior to her stroke.    PLAN:  1. Left corona radiata/lentiform nucleus infarct: Continue aspirin 81 mg daily and Eliquis (apixaban) daily  and lipitor  for secondary stroke prevention. Maintain strict control of hypertension with blood pressure goal below 130/90, diabetes with hemoglobin A1c goal below 6.5% and cholesterol with LDL cholesterol (bad cholesterol) goal below 70 mg/dL.  I also advised the patient  to eat a healthy diet with plenty of whole grains, cereals, fruits and vegetables, exercise regularly with at least 30 minutes of continuous activity daily and maintain ideal body weight. 2. Balance deficit: Currently at baseline but due to continued difficulty, provided patient with neuro rehab phone number to call to schedule initial evaluation 3. HTN: Advised to continue current treatment regimen.  Advised to continue to monitor at home along with continued follow-up with PCP for management 4. HLD: Advised to continue current treatment regimen along with continued follow-up with PCP for future prescribing and monitoring of lipid panel   Stable from stroke standpoint and recommend follow-up as needed   Greater than 50% of time during this 15 minute face visit was spent on counseling, explanation of diagnosis of stroke, ongoing balance difficulties, reviewing risk factor management of Wegener's vasculitis, HTN, HLD, planning of further management along with potential future management, and discussion with patient and family answering all questions.    Frann Rider, AGNP-BC  [facility] [address] [place], [place] [address]  Phone [phone] Fax [phone] Note: This document was prepared with digital dictation and possible smart phrase technology. Any transcriptional errors that result from this process are unintentional.

## 2019-05-13 ENCOUNTER — Telehealth: Payer: Self-pay | Admitting: Internal Medicine

## 2019-05-14 NOTE — Telephone Encounter (Signed)
Sent Mychart message asking for clarification - I sent a 90 day supply of her medication with 3 refills to her pharmacy on 04/14/2019

## 2019-05-26 DIAGNOSIS — D509 Iron deficiency anemia, unspecified: Secondary | ICD-10-CM | POA: Diagnosis not present

## 2019-05-26 DIAGNOSIS — D638 Anemia in other chronic diseases classified elsewhere: Secondary | ICD-10-CM | POA: Diagnosis not present

## 2019-05-26 DIAGNOSIS — D508 Other iron deficiency anemias: Secondary | ICD-10-CM | POA: Diagnosis not present

## 2019-05-26 DIAGNOSIS — D473 Essential (hemorrhagic) thrombocythemia: Secondary | ICD-10-CM | POA: Diagnosis not present

## 2019-05-26 DIAGNOSIS — D469 Myelodysplastic syndrome, unspecified: Secondary | ICD-10-CM | POA: Diagnosis not present

## 2019-05-31 DIAGNOSIS — Z23 Encounter for immunization: Secondary | ICD-10-CM | POA: Diagnosis not present

## 2019-06-11 DIAGNOSIS — Z20828 Contact with and (suspected) exposure to other viral communicable diseases: Secondary | ICD-10-CM | POA: Diagnosis not present

## 2019-06-11 DIAGNOSIS — D040 Carcinoma in situ of skin of lip: Secondary | ICD-10-CM | POA: Diagnosis not present

## 2019-06-13 DIAGNOSIS — D040 Carcinoma in situ of skin of lip: Secondary | ICD-10-CM | POA: Diagnosis not present

## 2019-06-18 DIAGNOSIS — L03116 Cellulitis of left lower limb: Secondary | ICD-10-CM | POA: Diagnosis not present

## 2019-06-18 DIAGNOSIS — D469 Myelodysplastic syndrome, unspecified: Secondary | ICD-10-CM | POA: Diagnosis not present

## 2019-06-18 DIAGNOSIS — I87332 Chronic venous hypertension (idiopathic) with ulcer and inflammation of left lower extremity: Secondary | ICD-10-CM | POA: Diagnosis not present

## 2019-06-24 DIAGNOSIS — M313 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: Secondary | ICD-10-CM | POA: Diagnosis not present

## 2019-06-25 DIAGNOSIS — R279 Unspecified lack of coordination: Secondary | ICD-10-CM | POA: Diagnosis not present

## 2019-06-25 DIAGNOSIS — I87332 Chronic venous hypertension (idiopathic) with ulcer and inflammation of left lower extremity: Secondary | ICD-10-CM | POA: Diagnosis not present

## 2019-06-25 DIAGNOSIS — G629 Polyneuropathy, unspecified: Secondary | ICD-10-CM | POA: Diagnosis not present

## 2019-06-25 DIAGNOSIS — L03116 Cellulitis of left lower limb: Secondary | ICD-10-CM | POA: Diagnosis not present

## 2019-07-03 DIAGNOSIS — E559 Vitamin D deficiency, unspecified: Secondary | ICD-10-CM | POA: Diagnosis not present

## 2019-07-03 DIAGNOSIS — G609 Hereditary and idiopathic neuropathy, unspecified: Secondary | ICD-10-CM | POA: Diagnosis not present

## 2019-07-03 DIAGNOSIS — G603 Idiopathic progressive neuropathy: Secondary | ICD-10-CM | POA: Diagnosis not present

## 2019-07-03 DIAGNOSIS — R2681 Unsteadiness on feet: Secondary | ICD-10-CM | POA: Diagnosis not present

## 2019-07-10 DIAGNOSIS — M810 Age-related osteoporosis without current pathological fracture: Secondary | ICD-10-CM | POA: Diagnosis not present

## 2019-07-10 DIAGNOSIS — Z79899 Other long term (current) drug therapy: Secondary | ICD-10-CM | POA: Diagnosis not present

## 2019-07-10 DIAGNOSIS — M313 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: Secondary | ICD-10-CM | POA: Diagnosis not present

## 2019-07-10 DIAGNOSIS — Z23 Encounter for immunization: Secondary | ICD-10-CM | POA: Diagnosis not present

## 2019-07-10 DIAGNOSIS — H00015 Hordeolum externum left lower eyelid: Secondary | ICD-10-CM | POA: Diagnosis not present

## 2019-07-18 DIAGNOSIS — Z7901 Long term (current) use of anticoagulants: Secondary | ICD-10-CM | POA: Diagnosis not present

## 2019-07-18 DIAGNOSIS — I2699 Other pulmonary embolism without acute cor pulmonale: Secondary | ICD-10-CM | POA: Diagnosis not present

## 2019-07-18 DIAGNOSIS — D62 Acute posthemorrhagic anemia: Secondary | ICD-10-CM | POA: Diagnosis not present

## 2019-07-18 DIAGNOSIS — I6350 Cerebral infarction due to unspecified occlusion or stenosis of unspecified cerebral artery: Secondary | ICD-10-CM | POA: Diagnosis not present

## 2019-07-18 DIAGNOSIS — R279 Unspecified lack of coordination: Secondary | ICD-10-CM | POA: Diagnosis not present

## 2019-07-18 DIAGNOSIS — M313 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: Secondary | ICD-10-CM | POA: Diagnosis not present

## 2019-07-18 DIAGNOSIS — G629 Polyneuropathy, unspecified: Secondary | ICD-10-CM | POA: Diagnosis not present

## 2019-07-18 DIAGNOSIS — L03116 Cellulitis of left lower limb: Secondary | ICD-10-CM | POA: Diagnosis not present

## 2019-07-18 DIAGNOSIS — I480 Paroxysmal atrial fibrillation: Secondary | ICD-10-CM | POA: Diagnosis not present

## 2019-07-18 DIAGNOSIS — R946 Abnormal results of thyroid function studies: Secondary | ICD-10-CM | POA: Diagnosis not present

## 2019-07-18 DIAGNOSIS — I87332 Chronic venous hypertension (idiopathic) with ulcer and inflammation of left lower extremity: Secondary | ICD-10-CM | POA: Diagnosis not present

## 2019-07-23 DIAGNOSIS — M313 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: Secondary | ICD-10-CM | POA: Diagnosis not present

## 2019-07-25 DIAGNOSIS — M6281 Muscle weakness (generalized): Secondary | ICD-10-CM | POA: Diagnosis not present

## 2019-07-25 DIAGNOSIS — R279 Unspecified lack of coordination: Secondary | ICD-10-CM | POA: Diagnosis not present

## 2019-07-25 DIAGNOSIS — M545 Low back pain: Secondary | ICD-10-CM | POA: Diagnosis not present

## 2019-07-25 DIAGNOSIS — R262 Difficulty in walking, not elsewhere classified: Secondary | ICD-10-CM | POA: Diagnosis not present

## 2019-07-28 DIAGNOSIS — M6281 Muscle weakness (generalized): Secondary | ICD-10-CM | POA: Diagnosis not present

## 2019-07-28 DIAGNOSIS — R279 Unspecified lack of coordination: Secondary | ICD-10-CM | POA: Diagnosis not present

## 2019-07-28 DIAGNOSIS — R262 Difficulty in walking, not elsewhere classified: Secondary | ICD-10-CM | POA: Diagnosis not present

## 2019-07-28 DIAGNOSIS — M545 Low back pain: Secondary | ICD-10-CM | POA: Diagnosis not present

## 2019-07-30 DIAGNOSIS — R262 Difficulty in walking, not elsewhere classified: Secondary | ICD-10-CM | POA: Diagnosis not present

## 2019-07-30 DIAGNOSIS — R279 Unspecified lack of coordination: Secondary | ICD-10-CM | POA: Diagnosis not present

## 2019-07-30 DIAGNOSIS — M6281 Muscle weakness (generalized): Secondary | ICD-10-CM | POA: Diagnosis not present

## 2019-07-30 DIAGNOSIS — M545 Low back pain: Secondary | ICD-10-CM | POA: Diagnosis not present

## 2019-08-25 DIAGNOSIS — R262 Difficulty in walking, not elsewhere classified: Secondary | ICD-10-CM | POA: Diagnosis not present

## 2019-08-25 DIAGNOSIS — M6281 Muscle weakness (generalized): Secondary | ICD-10-CM | POA: Diagnosis not present

## 2019-08-25 DIAGNOSIS — M545 Low back pain: Secondary | ICD-10-CM | POA: Diagnosis not present

## 2019-08-25 DIAGNOSIS — R279 Unspecified lack of coordination: Secondary | ICD-10-CM | POA: Diagnosis not present

## 2019-08-27 DIAGNOSIS — R262 Difficulty in walking, not elsewhere classified: Secondary | ICD-10-CM | POA: Diagnosis not present

## 2019-08-27 DIAGNOSIS — M6281 Muscle weakness (generalized): Secondary | ICD-10-CM | POA: Diagnosis not present

## 2019-08-27 DIAGNOSIS — R279 Unspecified lack of coordination: Secondary | ICD-10-CM | POA: Diagnosis not present

## 2019-08-27 DIAGNOSIS — M545 Low back pain: Secondary | ICD-10-CM | POA: Diagnosis not present

## 2019-09-05 DIAGNOSIS — D040 Carcinoma in situ of skin of lip: Secondary | ICD-10-CM | POA: Diagnosis not present

## 2019-09-08 DIAGNOSIS — M6281 Muscle weakness (generalized): Secondary | ICD-10-CM | POA: Diagnosis not present

## 2019-09-08 DIAGNOSIS — R262 Difficulty in walking, not elsewhere classified: Secondary | ICD-10-CM | POA: Diagnosis not present

## 2019-09-08 DIAGNOSIS — R279 Unspecified lack of coordination: Secondary | ICD-10-CM | POA: Diagnosis not present

## 2019-09-08 DIAGNOSIS — M545 Low back pain: Secondary | ICD-10-CM | POA: Diagnosis not present

## 2019-09-11 ENCOUNTER — Ambulatory Visit: Payer: Medicare Other | Attending: Internal Medicine

## 2019-09-11 DIAGNOSIS — Z23 Encounter for immunization: Secondary | ICD-10-CM | POA: Insufficient documentation

## 2019-09-11 NOTE — Progress Notes (Signed)
   Covid-19 Vaccination Clinic  Name:  [name]    MRN: [id number] DOB: [birth year]  09/11/2019  [name] was observed post Covid-19 immunization for 15 minutes without incidence. She was provided with Vaccine Information Sheet and instruction to access the V-Safe system.   [name] was instructed to call 911 with any severe reactions post vaccine: Marland Kitchen Difficulty breathing  . Swelling of your face and throat  . A fast heartbeat  . A bad rash all over your body  . Dizziness and weakness    Immunizations Administered    Name Date Dose VIS Date Route   Pfizer COVID-19 Vaccine 09/11/2019 12:55 PM 0.3 mL 08/01/2019 Intramuscular   Manufacturer: [place]   Lot: [id number]   [place]: [id number]

## 2019-09-22 DIAGNOSIS — D469 Myelodysplastic syndrome, unspecified: Secondary | ICD-10-CM | POA: Diagnosis not present

## 2019-09-22 DIAGNOSIS — D638 Anemia in other chronic diseases classified elsewhere: Secondary | ICD-10-CM | POA: Diagnosis not present

## 2019-09-22 DIAGNOSIS — D508 Other iron deficiency anemias: Secondary | ICD-10-CM | POA: Diagnosis not present

## 2019-10-02 ENCOUNTER — Ambulatory Visit: Payer: Medicare Other | Attending: Internal Medicine

## 2019-10-02 DIAGNOSIS — Z23 Encounter for immunization: Secondary | ICD-10-CM | POA: Insufficient documentation

## 2019-10-02 NOTE — Progress Notes (Signed)
   Covid-19 Vaccination Clinic  Name:  [name]    MRN: [id number] DOB: 1951-03-12  10/02/2019  [name] was observed post Covid-19 immunization for 15 minutes without incidence. She was provided with Vaccine Information Sheet and instruction to access the V-Safe system.   Ms. How was instructed to call 911 with any severe reactions post vaccine: Marland Kitchen Difficulty breathing  . Swelling of your face and throat  . A fast heartbeat  . A bad rash all over your body  . Dizziness and weakness    Immunizations Administered    Name Date Dose VIS Date Route   Pfizer COVID-19 Vaccine 10/02/2019  1:51 PM 0.3 mL 08/01/2019 Intramuscular   Manufacturer: [place]   Lot: [id number]   [place]: [id number]

## 2019-10-14 DIAGNOSIS — D1801 Hemangioma of skin and subcutaneous tissue: Secondary | ICD-10-CM | POA: Diagnosis not present

## 2019-10-14 DIAGNOSIS — Z85828 Personal history of other malignant neoplasm of skin: Secondary | ICD-10-CM | POA: Diagnosis not present

## 2019-10-14 DIAGNOSIS — D229 Melanocytic nevi, unspecified: Secondary | ICD-10-CM | POA: Diagnosis not present

## 2019-10-14 DIAGNOSIS — L821 Other seborrheic keratosis: Secondary | ICD-10-CM | POA: Diagnosis not present

## 2019-10-14 DIAGNOSIS — L570 Actinic keratosis: Secondary | ICD-10-CM | POA: Diagnosis not present

## 2019-10-14 DIAGNOSIS — L814 Other melanin hyperpigmentation: Secondary | ICD-10-CM | POA: Diagnosis not present

## 2019-10-14 DIAGNOSIS — D485 Neoplasm of uncertain behavior of skin: Secondary | ICD-10-CM | POA: Diagnosis not present

## 2019-10-16 DIAGNOSIS — H52203 Unspecified astigmatism, bilateral: Secondary | ICD-10-CM | POA: Diagnosis not present

## 2019-10-16 DIAGNOSIS — H2513 Age-related nuclear cataract, bilateral: Secondary | ICD-10-CM | POA: Diagnosis not present

## 2019-10-16 DIAGNOSIS — H524 Presbyopia: Secondary | ICD-10-CM | POA: Diagnosis not present

## 2019-11-11 ENCOUNTER — Encounter: Payer: Self-pay | Admitting: Certified Nurse Midwife

## 2019-11-18 DIAGNOSIS — C4492 Squamous cell carcinoma of skin, unspecified: Secondary | ICD-10-CM | POA: Diagnosis not present

## 2019-11-18 DIAGNOSIS — L570 Actinic keratosis: Secondary | ICD-10-CM | POA: Diagnosis not present

## 2019-11-20 DIAGNOSIS — M5417 Radiculopathy, lumbosacral region: Secondary | ICD-10-CM | POA: Diagnosis not present

## 2019-11-20 DIAGNOSIS — M313 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: Secondary | ICD-10-CM | POA: Diagnosis not present

## 2019-11-20 DIAGNOSIS — R2681 Unsteadiness on feet: Secondary | ICD-10-CM | POA: Diagnosis not present

## 2019-11-20 DIAGNOSIS — G603 Idiopathic progressive neuropathy: Secondary | ICD-10-CM | POA: Diagnosis not present

## 2019-12-29 DIAGNOSIS — D471 Chronic myeloproliferative disease: Secondary | ICD-10-CM | POA: Diagnosis not present

## 2019-12-29 DIAGNOSIS — D473 Essential (hemorrhagic) thrombocythemia: Secondary | ICD-10-CM | POA: Diagnosis not present

## 2019-12-29 DIAGNOSIS — Z79899 Other long term (current) drug therapy: Secondary | ICD-10-CM | POA: Diagnosis not present

## 2019-12-29 DIAGNOSIS — D469 Myelodysplastic syndrome, unspecified: Secondary | ICD-10-CM | POA: Diagnosis not present

## 2020-01-12 DIAGNOSIS — M313 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: Secondary | ICD-10-CM | POA: Diagnosis not present

## 2020-01-12 DIAGNOSIS — Z79899 Other long term (current) drug therapy: Secondary | ICD-10-CM | POA: Diagnosis not present

## 2020-01-12 DIAGNOSIS — M810 Age-related osteoporosis without current pathological fracture: Secondary | ICD-10-CM | POA: Diagnosis not present

## 2020-01-12 DIAGNOSIS — D469 Myelodysplastic syndrome, unspecified: Secondary | ICD-10-CM | POA: Diagnosis not present

## 2020-02-27 DIAGNOSIS — Z1212 Encounter for screening for malignant neoplasm of rectum: Secondary | ICD-10-CM | POA: Diagnosis not present

## 2020-03-04 ENCOUNTER — Other Ambulatory Visit: Payer: Self-pay | Admitting: Internal Medicine

## 2020-03-04 DIAGNOSIS — M810 Age-related osteoporosis without current pathological fracture: Secondary | ICD-10-CM

## 2020-03-04 DIAGNOSIS — Z1231 Encounter for screening mammogram for malignant neoplasm of breast: Secondary | ICD-10-CM

## 2020-03-25 DIAGNOSIS — M313 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: Secondary | ICD-10-CM | POA: Diagnosis not present

## 2020-03-25 DIAGNOSIS — R2681 Unsteadiness on feet: Secondary | ICD-10-CM | POA: Diagnosis not present

## 2020-03-25 DIAGNOSIS — G603 Idiopathic progressive neuropathy: Secondary | ICD-10-CM | POA: Diagnosis not present

## 2020-03-25 DIAGNOSIS — M5417 Radiculopathy, lumbosacral region: Secondary | ICD-10-CM | POA: Diagnosis not present

## 2020-04-07 DIAGNOSIS — M79602 Pain in left arm: Secondary | ICD-10-CM | POA: Diagnosis not present

## 2020-04-07 DIAGNOSIS — S51812A Laceration without foreign body of left forearm, initial encounter: Secondary | ICD-10-CM | POA: Diagnosis not present

## 2020-04-08 ENCOUNTER — Ambulatory Visit
Admission: RE | Admit: 2020-04-08 | Discharge: 2020-04-08 | Disposition: A | Discharge status: Home / Self Care | Payer: Medicare Other | Source: Ambulatory Visit | Attending: Internal Medicine | Admitting: Internal Medicine

## 2020-04-08 ENCOUNTER — Other Ambulatory Visit: Payer: Self-pay

## 2020-04-08 DIAGNOSIS — Z1231 Encounter for screening mammogram for malignant neoplasm of breast: Secondary | ICD-10-CM

## 2020-04-09 DIAGNOSIS — Z7901 Long term (current) use of anticoagulants: Secondary | ICD-10-CM | POA: Diagnosis not present

## 2020-04-09 DIAGNOSIS — I480 Paroxysmal atrial fibrillation: Secondary | ICD-10-CM | POA: Diagnosis not present

## 2020-04-09 DIAGNOSIS — R05 Cough: Secondary | ICD-10-CM | POA: Diagnosis not present

## 2020-04-09 DIAGNOSIS — I10 Essential (primary) hypertension: Secondary | ICD-10-CM | POA: Diagnosis not present

## 2020-04-09 DIAGNOSIS — R0981 Nasal congestion: Secondary | ICD-10-CM | POA: Diagnosis not present

## 2020-04-09 DIAGNOSIS — Z1152 Encounter for screening for COVID-19: Secondary | ICD-10-CM | POA: Diagnosis not present

## 2020-04-09 DIAGNOSIS — Z20828 Contact with and (suspected) exposure to other viral communicable diseases: Secondary | ICD-10-CM | POA: Diagnosis not present

## 2020-04-09 DIAGNOSIS — B349 Viral infection, unspecified: Secondary | ICD-10-CM | POA: Diagnosis not present

## 2020-04-27 ENCOUNTER — Ambulatory Visit: Payer: Self-pay | Attending: Internal Medicine

## 2020-04-27 DIAGNOSIS — Z23 Encounter for immunization: Secondary | ICD-10-CM

## 2020-04-27 NOTE — Progress Notes (Signed)
   Covid-19 Vaccination Clinic  Name:  [name]    MRN: [id number] DOB: Jan 09, 1951  04/27/2020  [name] was observed post Covid-19 immunization for 15 minutes without incident. She was provided with Vaccine Information Sheet and instruction to access the V-Safe system.   Ms. Peri was instructed to call 911 with any severe reactions post vaccine: Marland Kitchen Difficulty breathing  . Swelling of face and throat  . A fast heartbeat  . A bad rash all over body  . Dizziness and weakness

## 2020-05-23 ENCOUNTER — Other Ambulatory Visit: Payer: Self-pay | Admitting: Internal Medicine

## 2020-05-29 DIAGNOSIS — Z23 Encounter for immunization: Secondary | ICD-10-CM | POA: Diagnosis not present

## 2020-05-31 ENCOUNTER — Other Ambulatory Visit: Payer: Self-pay

## 2020-05-31 ENCOUNTER — Encounter: Payer: Self-pay | Admitting: Dermatology

## 2020-05-31 ENCOUNTER — Ambulatory Visit (INDEPENDENT_AMBULATORY_CARE_PROVIDER_SITE_OTHER): Payer: Medicare Other | Admitting: Dermatology

## 2020-05-31 DIAGNOSIS — Z1283 Encounter for screening for malignant neoplasm of skin: Secondary | ICD-10-CM

## 2020-05-31 DIAGNOSIS — L570 Actinic keratosis: Secondary | ICD-10-CM

## 2020-05-31 DIAGNOSIS — L299 Pruritus, unspecified: Secondary | ICD-10-CM

## 2020-05-31 NOTE — Progress Notes (Signed)
LN2  x1 on left lower leg near above the ankle x1 on mid upper back x2 on right cheek x1 on right temple x1 on bridge of nose

## 2020-06-02 ENCOUNTER — Ambulatory Visit
Admission: RE | Admit: 2020-06-02 | Discharge: 2020-06-02 | Disposition: A | Discharge status: Home / Self Care | Payer: Medicare Other | Source: Ambulatory Visit | Attending: Internal Medicine | Admitting: Internal Medicine

## 2020-06-02 ENCOUNTER — Other Ambulatory Visit: Payer: Self-pay

## 2020-06-02 DIAGNOSIS — M8589 Other specified disorders of bone density and structure, multiple sites: Secondary | ICD-10-CM | POA: Diagnosis not present

## 2020-06-02 DIAGNOSIS — M810 Age-related osteoporosis without current pathological fracture: Secondary | ICD-10-CM

## 2020-06-02 DIAGNOSIS — Z780 Asymptomatic menopausal state: Secondary | ICD-10-CM | POA: Diagnosis not present

## 2020-06-21 DIAGNOSIS — M313 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: Secondary | ICD-10-CM | POA: Diagnosis not present

## 2020-06-21 DIAGNOSIS — Z79899 Other long term (current) drug therapy: Secondary | ICD-10-CM | POA: Diagnosis not present

## 2020-07-05 DIAGNOSIS — D75839 Thrombocytosis, unspecified: Secondary | ICD-10-CM | POA: Diagnosis not present

## 2020-07-05 DIAGNOSIS — Z79899 Other long term (current) drug therapy: Secondary | ICD-10-CM | POA: Diagnosis not present

## 2020-07-05 DIAGNOSIS — D638 Anemia in other chronic diseases classified elsewhere: Secondary | ICD-10-CM | POA: Diagnosis not present

## 2020-07-05 DIAGNOSIS — D469 Myelodysplastic syndrome, unspecified: Secondary | ICD-10-CM | POA: Diagnosis not present

## 2020-07-05 DIAGNOSIS — D509 Iron deficiency anemia, unspecified: Secondary | ICD-10-CM | POA: Diagnosis not present

## 2020-08-04 DIAGNOSIS — M313 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: Secondary | ICD-10-CM | POA: Diagnosis not present

## 2020-08-15 ENCOUNTER — Encounter: Payer: Self-pay | Admitting: Dermatology

## 2020-08-16 NOTE — Progress Notes (Signed)
   Follow-Up Visit   Subjective  [name] is a 69 y.o. female who presents for the following: Follow-up (FACE SCALE, ANKLE X MONTHS NONHEALING,  SCALP ITCH ALL THE TIME).  General skin examination.  Several crusted areas Location:  Duration:  Quality:  Associated Signs/Symptoms: Modifying Factors:  Severity:  Timing: Context:   Objective  Well appearing patient in no apparent distress; mood and affect are within normal limits. Objective  Dorsum of Nose, Left Ankle - Anterior, Neck - Posterior, Right Buccal Cheek (2), Right Temple: 2 to 4 mm horny pink crusts  LN2  x1 on left lower leg near above the ankle x1 on mid upper back x2 on right cheek x1 on right temple x1 on bridge of noseLN2     Objective  Mid Parietal Scalp: Pruritus without significant scaling or inflammation: This may represent localized cutaneous dysesthesia.  Objective  Mid Back: Sun exposed areas, back, and upper chest examined.  No atypical moles or melanoma.   All sun exposed areas plus back examined.   Assessment & Plan    AK (actinic keratosis) (6) Left Ankle - Anterior; Neck - Posterior; Right Temple; Dorsum of Nose; Right Buccal Cheek (2)  Destruction of lesion - Dorsum of Nose, Left Ankle - Anterior, Neck - Posterior, Right Buccal Cheek, Right Temple Complexity: simple   Destruction method: cryotherapy   Informed consent: discussed and consent obtained   Lesion destroyed using liquid nitrogen: Yes   Outcome: patient tolerated procedure well with no complications    Pruritus Mid Parietal Scalp  Can try over-the-counter Scalpicin lotion.  If this fails she can call us and will see if she can obtain something like clobetasol foam to try for 1 month.  Skin exam for malignant neoplasm Mid Back  Annual skin examination.     I, Lavonna Monarch, MD, have reviewed all documentation for this visit.  The documentation on 08/16/20 for the exam, diagnosis, procedures, and orders  are all accurate and complete.

## 2020-08-25 DIAGNOSIS — D509 Iron deficiency anemia, unspecified: Secondary | ICD-10-CM | POA: Diagnosis not present

## 2020-08-25 DIAGNOSIS — I10 Essential (primary) hypertension: Secondary | ICD-10-CM | POA: Diagnosis not present

## 2020-08-25 DIAGNOSIS — I6350 Cerebral infarction due to unspecified occlusion or stenosis of unspecified cerebral artery: Secondary | ICD-10-CM | POA: Diagnosis not present

## 2020-08-25 DIAGNOSIS — M313 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: Secondary | ICD-10-CM | POA: Diagnosis not present

## 2020-08-25 DIAGNOSIS — G629 Polyneuropathy, unspecified: Secondary | ICD-10-CM | POA: Diagnosis not present

## 2020-08-25 DIAGNOSIS — E785 Hyperlipidemia, unspecified: Secondary | ICD-10-CM | POA: Diagnosis not present

## 2020-08-25 DIAGNOSIS — I872 Venous insufficiency (chronic) (peripheral): Secondary | ICD-10-CM | POA: Diagnosis not present

## 2020-08-25 DIAGNOSIS — K219 Gastro-esophageal reflux disease without esophagitis: Secondary | ICD-10-CM | POA: Diagnosis not present

## 2020-08-25 DIAGNOSIS — D469 Myelodysplastic syndrome, unspecified: Secondary | ICD-10-CM | POA: Diagnosis not present

## 2020-08-25 DIAGNOSIS — M5136 Other intervertebral disc degeneration, lumbar region: Secondary | ICD-10-CM | POA: Diagnosis not present

## 2020-08-25 DIAGNOSIS — D6869 Other thrombophilia: Secondary | ICD-10-CM | POA: Diagnosis not present

## 2020-08-25 DIAGNOSIS — L52 Erythema nodosum: Secondary | ICD-10-CM | POA: Diagnosis not present

## 2020-08-31 ENCOUNTER — Other Ambulatory Visit: Payer: Self-pay

## 2020-08-31 ENCOUNTER — Ambulatory Visit (INDEPENDENT_AMBULATORY_CARE_PROVIDER_SITE_OTHER): Payer: Medicare Other | Admitting: Dermatology

## 2020-08-31 ENCOUNTER — Encounter: Payer: Self-pay | Admitting: Dermatology

## 2020-08-31 DIAGNOSIS — L570 Actinic keratosis: Secondary | ICD-10-CM

## 2020-08-31 DIAGNOSIS — C44719 Basal cell carcinoma of skin of left lower limb, including hip: Secondary | ICD-10-CM | POA: Diagnosis not present

## 2020-08-31 DIAGNOSIS — D485 Neoplasm of uncertain behavior of skin: Secondary | ICD-10-CM

## 2020-08-31 NOTE — Patient Instructions (Signed)

## 2020-09-03 ENCOUNTER — Encounter: Payer: Self-pay | Admitting: Dermatology

## 2020-09-03 NOTE — Progress Notes (Addendum)
   Follow-Up Visit   Subjective  [name] is a 70 y.o. female who presents for the following: Follow-up (3 month f/u- left ankle- Ln2 last office visit- it has come back, check top & bottom lip- no itch no bleed & right cheek- no itch no bleed-doesn't seem to heal).  Sore spot Location: Left ankle Duration:  Quality:  Associated Signs/Symptoms: Modifying Factors: Raising did not clear Severity:  Timing: Context:   Objective  Well appearing patient in no apparent distress; mood and affect are within normal limits. Objective  Left Lower Leg - Anterior: Eroded 1 cm crust     Objective  Left Forearm - Posterior, Right Forearm - Posterior, Right Malar Cheek: 2 to 6 mm flat pink crusts   A focused examination was performed including Sun exposed areas plus arms and legs.. Relevant physical exam findings are noted in the Assessment and Plan.   Assessment & Plan    Neoplasm of uncertain behavior of skin Left Lower Leg - Anterior  Skin / nail biopsy Type of biopsy: tangential   Informed consent: discussed and consent obtained   Timeout: patient name, date of birth, surgical site, and procedure verified   Anesthesia: the lesion was anesthetized in a standard fashion   Anesthetic:  1% lidocaine w/ epinephrine 1-100,000 local infiltration Instrument used: flexible razor blade   Hemostasis achieved with: ferric subsulfate   Outcome: patient tolerated procedure well   Post-procedure details: sterile dressing applied and wound care instructions given   Dressing type: bandage and petrolatum    Specimen 1 - Surgical pathology Differential Diagnosis: bcc vs scc  Check Margins: No  Shave biopsy; may refer for Mohs if beyond superficial carcinoma.  Patient will use Mupirocin ointment on wound- patient already has some at home- will call if need a refill  AK (actinic keratosis) (3) Left Forearm - Posterior; Right Forearm - Posterior; Right Malar Cheek  Destruction of  lesion - Left Forearm - Posterior, Right Forearm - Posterior, Right Malar Cheek Complexity: simple   Destruction method: cryotherapy   Informed consent: discussed and consent obtained   Timeout:  patient name, date of birth, surgical site, and procedure verified Lesion destroyed using liquid nitrogen: Yes   Cryotherapy cycles:  3 Outcome: patient tolerated procedure well with no complications       I, Lavonna Monarch, MD, have reviewed all documentation for this visit.  The documentation on 09/07/20 for the exam, diagnosis, procedures, and orders are all accurate and complete.

## 2020-09-07 NOTE — Addendum Note (Signed)
Addended by: Lavonna Monarch on: 09/07/2020 09:25 AM   Modules accepted: Orders

## 2020-09-08 ENCOUNTER — Encounter: Payer: Self-pay | Admitting: Dermatology

## 2020-10-19 DIAGNOSIS — H524 Presbyopia: Secondary | ICD-10-CM | POA: Diagnosis not present

## 2020-10-19 DIAGNOSIS — H52203 Unspecified astigmatism, bilateral: Secondary | ICD-10-CM | POA: Diagnosis not present

## 2020-10-19 DIAGNOSIS — H2513 Age-related nuclear cataract, bilateral: Secondary | ICD-10-CM | POA: Diagnosis not present

## 2020-11-22 ENCOUNTER — Other Ambulatory Visit: Payer: Self-pay | Admitting: Internal Medicine

## 2020-11-23 DIAGNOSIS — M313 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: Secondary | ICD-10-CM | POA: Diagnosis not present

## 2020-11-23 DIAGNOSIS — Z79899 Other long term (current) drug therapy: Secondary | ICD-10-CM | POA: Diagnosis not present

## 2020-11-25 ENCOUNTER — Other Ambulatory Visit: Payer: Self-pay | Admitting: Adult Health

## 2020-11-25 ENCOUNTER — Telehealth: Payer: Self-pay

## 2020-11-25 DIAGNOSIS — R918 Other nonspecific abnormal finding of lung field: Secondary | ICD-10-CM

## 2020-11-25 NOTE — Telephone Encounter (Signed)
Called to discuss with patient about COVID-19 symptoms and the use of one of the available treatments for those with mild to moderate Covid symptoms and at a high risk of hospitalization.  Pt appears to qualify for outpatient treatment due to co-morbid conditions and/or a member of an at-risk group in accordance with the FDA Emergency Use Authorization.     Pt. Reports she does not have COVID 19. Her doctor at [facility] request she receive Evusheld treatment for Wegener's disease.Currently receives chemo every 6 months. Pt. Wishes to speak with APP.  Marcello Moores

## 2020-11-25 NOTE — Progress Notes (Signed)
I notified Dr. Silvestre Mesi office by calling them and through email regarding the correct referral order and form location for evusheld.  I am placing orders for this referral through [facility] on [address] so they will go to the right queue.    Wilber Bihari, NP

## 2020-11-29 ENCOUNTER — Encounter: Payer: Self-pay | Admitting: Adult Health

## 2020-11-29 ENCOUNTER — Other Ambulatory Visit: Payer: Self-pay | Admitting: Adult Health

## 2020-11-29 DIAGNOSIS — D469 Myelodysplastic syndrome, unspecified: Secondary | ICD-10-CM

## 2020-11-29 NOTE — Progress Notes (Signed)
I connected by phone with [name] on 11/29/2020, 3:10 PM to discuss the potential use of a new treatment, tixagevimab/cilgavimab, for pre-exposure prophylaxis for prevention of coronavirus disease 2019 (COVID-19) caused by the SARS-CoV-2 virus.  This patient is a 70 y.o. female that meets the FDA criteria for Emergency Use Authorization of tixagevimab/cilgavimab for pre-exposure prophylaxis of COVID-19 disease. Pt meets following criteria:  Age >12 yr and weight > 40kg  Not currently infected with SARS-CoV-2 and has no known recent exposure to an individual infected with SARS-CoV-2 AND o Who has moderate to severe immune compromise due to a medical condition or receipt of immunosuppressive medications or treatments and may not mount an adequate immune response to COVID-19 vaccination or  o Vaccination with any available COVID-19 vaccine, according to the approved or authorized schedule, is not recommended due to a history of severe adverse reaction (e.g., severe allergic reaction) to a COVID-19 vaccine(s) and/or COVID-19 vaccine component(s).  o Patient meets the following definition of mod-severe immune compromised status: 1. Received B-cell depleting therapies (e.g. rituximab, obinutuzumab, ocrelizumab, alemtuzumab) within last 6 months & age > or = 59  I have spoken and communicated the following to the patient or parent/caregiver regarding COVID monoclonal antibody treatment:  1. FDA has authorized the emergency use of tixagevimab/cilgavimab for the pre-exposure prophylaxis of COVID-19 in patients with moderate-severe immunocompromised status, who meet above EUA criteria.  2. The significant known and potential risks and benefits of COVID monoclonal antibody, and the extent to which such potential risks and benefits are unknown.  3. Information on available alternative treatments and the risks and benefits of those alternatives, including clinical trials.  4. The patient or  parent/caregiver has the option to accept or refuse COVID monoclonal antibody treatment.  After reviewing this information with the patient, agree to receive tixagevimab/cilgavimab. Called [facility] to schedule.    Scot Dock, NP, 11/29/2020, 3:10 PM

## 2020-12-18 DIAGNOSIS — E785 Hyperlipidemia, unspecified: Secondary | ICD-10-CM | POA: Diagnosis not present

## 2020-12-18 DIAGNOSIS — I87319 Chronic venous hypertension (idiopathic) with ulcer of unspecified lower extremity: Secondary | ICD-10-CM | POA: Diagnosis not present

## 2020-12-18 DIAGNOSIS — I10 Essential (primary) hypertension: Secondary | ICD-10-CM | POA: Diagnosis not present

## 2020-12-18 DIAGNOSIS — I480 Paroxysmal atrial fibrillation: Secondary | ICD-10-CM | POA: Diagnosis not present

## 2020-12-28 DIAGNOSIS — L249 Irritant contact dermatitis, unspecified cause: Secondary | ICD-10-CM | POA: Diagnosis not present

## 2020-12-28 DIAGNOSIS — B351 Tinea unguium: Secondary | ICD-10-CM | POA: Diagnosis not present

## 2020-12-28 DIAGNOSIS — M313 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: Secondary | ICD-10-CM | POA: Diagnosis not present

## 2020-12-28 DIAGNOSIS — D229 Melanocytic nevi, unspecified: Secondary | ICD-10-CM | POA: Diagnosis not present

## 2020-12-28 DIAGNOSIS — L570 Actinic keratosis: Secondary | ICD-10-CM | POA: Diagnosis not present

## 2020-12-28 DIAGNOSIS — L814 Other melanin hyperpigmentation: Secondary | ICD-10-CM | POA: Diagnosis not present

## 2020-12-28 DIAGNOSIS — Z85828 Personal history of other malignant neoplasm of skin: Secondary | ICD-10-CM | POA: Diagnosis not present

## 2020-12-28 DIAGNOSIS — D1801 Hemangioma of skin and subcutaneous tissue: Secondary | ICD-10-CM | POA: Diagnosis not present

## 2020-12-28 DIAGNOSIS — L578 Other skin changes due to chronic exposure to nonionizing radiation: Secondary | ICD-10-CM | POA: Diagnosis not present

## 2020-12-28 DIAGNOSIS — L821 Other seborrheic keratosis: Secondary | ICD-10-CM | POA: Diagnosis not present

## 2020-12-30 DIAGNOSIS — Z79899 Other long term (current) drug therapy: Secondary | ICD-10-CM | POA: Diagnosis not present

## 2020-12-30 DIAGNOSIS — D509 Iron deficiency anemia, unspecified: Secondary | ICD-10-CM | POA: Diagnosis not present

## 2020-12-30 DIAGNOSIS — D469 Myelodysplastic syndrome, unspecified: Secondary | ICD-10-CM | POA: Diagnosis not present

## 2021-01-18 DIAGNOSIS — E785 Hyperlipidemia, unspecified: Secondary | ICD-10-CM | POA: Diagnosis not present

## 2021-01-18 DIAGNOSIS — I480 Paroxysmal atrial fibrillation: Secondary | ICD-10-CM | POA: Diagnosis not present

## 2021-01-18 DIAGNOSIS — I10 Essential (primary) hypertension: Secondary | ICD-10-CM | POA: Diagnosis not present

## 2021-01-18 DIAGNOSIS — I87319 Chronic venous hypertension (idiopathic) with ulcer of unspecified lower extremity: Secondary | ICD-10-CM | POA: Diagnosis not present

## 2021-01-22 DIAGNOSIS — Z23 Encounter for immunization: Secondary | ICD-10-CM | POA: Diagnosis not present

## 2021-02-02 DIAGNOSIS — M313 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: Secondary | ICD-10-CM | POA: Diagnosis not present

## 2021-02-17 DIAGNOSIS — I480 Paroxysmal atrial fibrillation: Secondary | ICD-10-CM | POA: Diagnosis not present

## 2021-02-17 DIAGNOSIS — E785 Hyperlipidemia, unspecified: Secondary | ICD-10-CM | POA: Diagnosis not present

## 2021-02-17 DIAGNOSIS — I10 Essential (primary) hypertension: Secondary | ICD-10-CM | POA: Diagnosis not present

## 2021-02-17 DIAGNOSIS — I87319 Chronic venous hypertension (idiopathic) with ulcer of unspecified lower extremity: Secondary | ICD-10-CM | POA: Diagnosis not present

## 2021-03-17 DIAGNOSIS — R946 Abnormal results of thyroid function studies: Secondary | ICD-10-CM | POA: Diagnosis not present

## 2021-03-17 DIAGNOSIS — E785 Hyperlipidemia, unspecified: Secondary | ICD-10-CM | POA: Diagnosis not present

## 2021-03-17 DIAGNOSIS — E559 Vitamin D deficiency, unspecified: Secondary | ICD-10-CM | POA: Diagnosis not present

## 2021-03-20 DIAGNOSIS — I10 Essential (primary) hypertension: Secondary | ICD-10-CM | POA: Diagnosis not present

## 2021-03-20 DIAGNOSIS — I480 Paroxysmal atrial fibrillation: Secondary | ICD-10-CM | POA: Diagnosis not present

## 2021-03-20 DIAGNOSIS — E785 Hyperlipidemia, unspecified: Secondary | ICD-10-CM | POA: Diagnosis not present

## 2021-03-20 DIAGNOSIS — I87319 Chronic venous hypertension (idiopathic) with ulcer of unspecified lower extremity: Secondary | ICD-10-CM | POA: Diagnosis not present

## 2021-03-23 ENCOUNTER — Other Ambulatory Visit: Payer: Self-pay | Admitting: Internal Medicine

## 2021-03-23 DIAGNOSIS — Z1231 Encounter for screening mammogram for malignant neoplasm of breast: Secondary | ICD-10-CM

## 2021-03-25 DIAGNOSIS — R82998 Other abnormal findings in urine: Secondary | ICD-10-CM | POA: Diagnosis not present

## 2021-03-25 DIAGNOSIS — D6869 Other thrombophilia: Secondary | ICD-10-CM | POA: Diagnosis not present

## 2021-03-25 DIAGNOSIS — E559 Vitamin D deficiency, unspecified: Secondary | ICD-10-CM | POA: Diagnosis not present

## 2021-03-25 DIAGNOSIS — Z1212 Encounter for screening for malignant neoplasm of rectum: Secondary | ICD-10-CM | POA: Diagnosis not present

## 2021-03-25 DIAGNOSIS — Z7901 Long term (current) use of anticoagulants: Secondary | ICD-10-CM | POA: Diagnosis not present

## 2021-03-25 DIAGNOSIS — Z0000 Encounter for general adult medical examination without abnormal findings: Secondary | ICD-10-CM | POA: Diagnosis not present

## 2021-03-25 DIAGNOSIS — R2242 Localized swelling, mass and lump, left lower limb: Secondary | ICD-10-CM | POA: Diagnosis not present

## 2021-03-25 DIAGNOSIS — E785 Hyperlipidemia, unspecified: Secondary | ICD-10-CM | POA: Diagnosis not present

## 2021-03-25 DIAGNOSIS — Z23 Encounter for immunization: Secondary | ICD-10-CM | POA: Diagnosis not present

## 2021-03-25 DIAGNOSIS — N3281 Overactive bladder: Secondary | ICD-10-CM | POA: Diagnosis not present

## 2021-03-25 DIAGNOSIS — I872 Venous insufficiency (chronic) (peripheral): Secondary | ICD-10-CM | POA: Diagnosis not present

## 2021-03-25 DIAGNOSIS — D509 Iron deficiency anemia, unspecified: Secondary | ICD-10-CM | POA: Diagnosis not present

## 2021-03-25 DIAGNOSIS — M810 Age-related osteoporosis without current pathological fracture: Secondary | ICD-10-CM | POA: Diagnosis not present

## 2021-03-25 DIAGNOSIS — I10 Essential (primary) hypertension: Secondary | ICD-10-CM | POA: Diagnosis not present

## 2021-04-07 DIAGNOSIS — R279 Unspecified lack of coordination: Secondary | ICD-10-CM | POA: Diagnosis not present

## 2021-04-07 DIAGNOSIS — R262 Difficulty in walking, not elsewhere classified: Secondary | ICD-10-CM | POA: Diagnosis not present

## 2021-04-07 DIAGNOSIS — M6281 Muscle weakness (generalized): Secondary | ICD-10-CM | POA: Diagnosis not present

## 2021-04-07 DIAGNOSIS — G629 Polyneuropathy, unspecified: Secondary | ICD-10-CM | POA: Diagnosis not present

## 2021-04-11 DIAGNOSIS — G629 Polyneuropathy, unspecified: Secondary | ICD-10-CM | POA: Diagnosis not present

## 2021-04-11 DIAGNOSIS — R279 Unspecified lack of coordination: Secondary | ICD-10-CM | POA: Diagnosis not present

## 2021-04-11 DIAGNOSIS — R262 Difficulty in walking, not elsewhere classified: Secondary | ICD-10-CM | POA: Diagnosis not present

## 2021-04-11 DIAGNOSIS — M6281 Muscle weakness (generalized): Secondary | ICD-10-CM | POA: Diagnosis not present

## 2021-04-12 DIAGNOSIS — L659 Nonscarring hair loss, unspecified: Secondary | ICD-10-CM | POA: Diagnosis not present

## 2021-04-12 DIAGNOSIS — L219 Seborrheic dermatitis, unspecified: Secondary | ICD-10-CM | POA: Diagnosis not present

## 2021-04-12 DIAGNOSIS — L299 Pruritus, unspecified: Secondary | ICD-10-CM | POA: Diagnosis not present

## 2021-04-12 DIAGNOSIS — L570 Actinic keratosis: Secondary | ICD-10-CM | POA: Diagnosis not present

## 2021-04-12 DIAGNOSIS — B351 Tinea unguium: Secondary | ICD-10-CM | POA: Diagnosis not present

## 2021-04-20 DIAGNOSIS — I87319 Chronic venous hypertension (idiopathic) with ulcer of unspecified lower extremity: Secondary | ICD-10-CM | POA: Diagnosis not present

## 2021-04-20 DIAGNOSIS — E785 Hyperlipidemia, unspecified: Secondary | ICD-10-CM | POA: Diagnosis not present

## 2021-04-20 DIAGNOSIS — I10 Essential (primary) hypertension: Secondary | ICD-10-CM | POA: Diagnosis not present

## 2021-04-20 DIAGNOSIS — I480 Paroxysmal atrial fibrillation: Secondary | ICD-10-CM | POA: Diagnosis not present

## 2021-04-27 DIAGNOSIS — M6281 Muscle weakness (generalized): Secondary | ICD-10-CM | POA: Diagnosis not present

## 2021-04-27 DIAGNOSIS — G629 Polyneuropathy, unspecified: Secondary | ICD-10-CM | POA: Diagnosis not present

## 2021-04-27 DIAGNOSIS — R262 Difficulty in walking, not elsewhere classified: Secondary | ICD-10-CM | POA: Diagnosis not present

## 2021-04-27 DIAGNOSIS — R279 Unspecified lack of coordination: Secondary | ICD-10-CM | POA: Diagnosis not present

## 2021-04-29 DIAGNOSIS — M6281 Muscle weakness (generalized): Secondary | ICD-10-CM | POA: Diagnosis not present

## 2021-04-29 DIAGNOSIS — R262 Difficulty in walking, not elsewhere classified: Secondary | ICD-10-CM | POA: Diagnosis not present

## 2021-04-29 DIAGNOSIS — R279 Unspecified lack of coordination: Secondary | ICD-10-CM | POA: Diagnosis not present

## 2021-04-29 DIAGNOSIS — R2681 Unsteadiness on feet: Secondary | ICD-10-CM | POA: Diagnosis not present

## 2021-05-02 DIAGNOSIS — R279 Unspecified lack of coordination: Secondary | ICD-10-CM | POA: Diagnosis not present

## 2021-05-02 DIAGNOSIS — R262 Difficulty in walking, not elsewhere classified: Secondary | ICD-10-CM | POA: Diagnosis not present

## 2021-05-02 DIAGNOSIS — G629 Polyneuropathy, unspecified: Secondary | ICD-10-CM | POA: Diagnosis not present

## 2021-05-02 DIAGNOSIS — M6281 Muscle weakness (generalized): Secondary | ICD-10-CM | POA: Diagnosis not present

## 2021-05-05 DIAGNOSIS — M6281 Muscle weakness (generalized): Secondary | ICD-10-CM | POA: Diagnosis not present

## 2021-05-05 DIAGNOSIS — R262 Difficulty in walking, not elsewhere classified: Secondary | ICD-10-CM | POA: Diagnosis not present

## 2021-05-05 DIAGNOSIS — G629 Polyneuropathy, unspecified: Secondary | ICD-10-CM | POA: Diagnosis not present

## 2021-05-05 DIAGNOSIS — R279 Unspecified lack of coordination: Secondary | ICD-10-CM | POA: Diagnosis not present

## 2021-05-09 DIAGNOSIS — G629 Polyneuropathy, unspecified: Secondary | ICD-10-CM | POA: Diagnosis not present

## 2021-05-09 DIAGNOSIS — M6281 Muscle weakness (generalized): Secondary | ICD-10-CM | POA: Diagnosis not present

## 2021-05-09 DIAGNOSIS — R279 Unspecified lack of coordination: Secondary | ICD-10-CM | POA: Diagnosis not present

## 2021-05-09 DIAGNOSIS — R262 Difficulty in walking, not elsewhere classified: Secondary | ICD-10-CM | POA: Diagnosis not present

## 2021-05-12 DIAGNOSIS — D469 Myelodysplastic syndrome, unspecified: Secondary | ICD-10-CM | POA: Diagnosis not present

## 2021-05-12 DIAGNOSIS — I749 Embolism and thrombosis of unspecified artery: Secondary | ICD-10-CM | POA: Diagnosis not present

## 2021-05-12 DIAGNOSIS — D509 Iron deficiency anemia, unspecified: Secondary | ICD-10-CM | POA: Diagnosis not present

## 2021-05-13 ENCOUNTER — Other Ambulatory Visit: Payer: Self-pay

## 2021-05-13 ENCOUNTER — Ambulatory Visit
Admission: RE | Admit: 2021-05-13 | Discharge: 2021-05-13 | Disposition: A | Discharge status: Home / Self Care | Payer: Medicare Other | Source: Ambulatory Visit | Attending: Internal Medicine | Admitting: Internal Medicine

## 2021-05-13 DIAGNOSIS — Z1231 Encounter for screening mammogram for malignant neoplasm of breast: Secondary | ICD-10-CM

## 2021-05-16 DIAGNOSIS — R279 Unspecified lack of coordination: Secondary | ICD-10-CM | POA: Diagnosis not present

## 2021-05-16 DIAGNOSIS — R262 Difficulty in walking, not elsewhere classified: Secondary | ICD-10-CM | POA: Diagnosis not present

## 2021-05-16 DIAGNOSIS — G629 Polyneuropathy, unspecified: Secondary | ICD-10-CM | POA: Diagnosis not present

## 2021-05-16 DIAGNOSIS — M6281 Muscle weakness (generalized): Secondary | ICD-10-CM | POA: Diagnosis not present

## 2021-05-19 DIAGNOSIS — M6281 Muscle weakness (generalized): Secondary | ICD-10-CM | POA: Diagnosis not present

## 2021-05-19 DIAGNOSIS — R279 Unspecified lack of coordination: Secondary | ICD-10-CM | POA: Diagnosis not present

## 2021-05-19 DIAGNOSIS — G629 Polyneuropathy, unspecified: Secondary | ICD-10-CM | POA: Diagnosis not present

## 2021-05-19 DIAGNOSIS — R262 Difficulty in walking, not elsewhere classified: Secondary | ICD-10-CM | POA: Diagnosis not present

## 2021-05-22 ENCOUNTER — Other Ambulatory Visit: Payer: Self-pay | Admitting: Internal Medicine

## 2021-05-23 DIAGNOSIS — M6281 Muscle weakness (generalized): Secondary | ICD-10-CM | POA: Diagnosis not present

## 2021-05-23 DIAGNOSIS — R262 Difficulty in walking, not elsewhere classified: Secondary | ICD-10-CM | POA: Diagnosis not present

## 2021-05-23 DIAGNOSIS — R279 Unspecified lack of coordination: Secondary | ICD-10-CM | POA: Diagnosis not present

## 2021-05-23 DIAGNOSIS — G629 Polyneuropathy, unspecified: Secondary | ICD-10-CM | POA: Diagnosis not present

## 2021-05-24 DIAGNOSIS — M313 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: Secondary | ICD-10-CM | POA: Diagnosis not present

## 2021-05-24 DIAGNOSIS — Z7960 Long term (current) use of unspecified immunomodulators and immunosuppressants: Secondary | ICD-10-CM | POA: Diagnosis not present

## 2021-05-25 DIAGNOSIS — R279 Unspecified lack of coordination: Secondary | ICD-10-CM | POA: Diagnosis not present

## 2021-05-25 DIAGNOSIS — R262 Difficulty in walking, not elsewhere classified: Secondary | ICD-10-CM | POA: Diagnosis not present

## 2021-05-25 DIAGNOSIS — G629 Polyneuropathy, unspecified: Secondary | ICD-10-CM | POA: Diagnosis not present

## 2021-05-25 DIAGNOSIS — M6281 Muscle weakness (generalized): Secondary | ICD-10-CM | POA: Diagnosis not present

## 2021-05-28 DIAGNOSIS — Z23 Encounter for immunization: Secondary | ICD-10-CM | POA: Diagnosis not present

## 2021-06-01 DIAGNOSIS — M6281 Muscle weakness (generalized): Secondary | ICD-10-CM | POA: Diagnosis not present

## 2021-06-01 DIAGNOSIS — R279 Unspecified lack of coordination: Secondary | ICD-10-CM | POA: Diagnosis not present

## 2021-06-01 DIAGNOSIS — G629 Polyneuropathy, unspecified: Secondary | ICD-10-CM | POA: Diagnosis not present

## 2021-06-01 DIAGNOSIS — R262 Difficulty in walking, not elsewhere classified: Secondary | ICD-10-CM | POA: Diagnosis not present

## 2021-06-03 DIAGNOSIS — R279 Unspecified lack of coordination: Secondary | ICD-10-CM | POA: Diagnosis not present

## 2021-06-03 DIAGNOSIS — M6281 Muscle weakness (generalized): Secondary | ICD-10-CM | POA: Diagnosis not present

## 2021-06-03 DIAGNOSIS — G629 Polyneuropathy, unspecified: Secondary | ICD-10-CM | POA: Diagnosis not present

## 2021-06-03 DIAGNOSIS — R262 Difficulty in walking, not elsewhere classified: Secondary | ICD-10-CM | POA: Diagnosis not present

## 2021-06-06 DIAGNOSIS — M6281 Muscle weakness (generalized): Secondary | ICD-10-CM | POA: Diagnosis not present

## 2021-06-06 DIAGNOSIS — R262 Difficulty in walking, not elsewhere classified: Secondary | ICD-10-CM | POA: Diagnosis not present

## 2021-06-06 DIAGNOSIS — G629 Polyneuropathy, unspecified: Secondary | ICD-10-CM | POA: Diagnosis not present

## 2021-06-06 DIAGNOSIS — R279 Unspecified lack of coordination: Secondary | ICD-10-CM | POA: Diagnosis not present

## 2021-06-08 DIAGNOSIS — G629 Polyneuropathy, unspecified: Secondary | ICD-10-CM | POA: Diagnosis not present

## 2021-06-08 DIAGNOSIS — R279 Unspecified lack of coordination: Secondary | ICD-10-CM | POA: Diagnosis not present

## 2021-06-08 DIAGNOSIS — M6281 Muscle weakness (generalized): Secondary | ICD-10-CM | POA: Diagnosis not present

## 2021-06-08 DIAGNOSIS — R262 Difficulty in walking, not elsewhere classified: Secondary | ICD-10-CM | POA: Diagnosis not present

## 2021-06-16 DIAGNOSIS — M6281 Muscle weakness (generalized): Secondary | ICD-10-CM | POA: Diagnosis not present

## 2021-06-16 DIAGNOSIS — R262 Difficulty in walking, not elsewhere classified: Secondary | ICD-10-CM | POA: Diagnosis not present

## 2021-06-16 DIAGNOSIS — R279 Unspecified lack of coordination: Secondary | ICD-10-CM | POA: Diagnosis not present

## 2021-06-16 DIAGNOSIS — G629 Polyneuropathy, unspecified: Secondary | ICD-10-CM | POA: Diagnosis not present

## 2021-06-20 DIAGNOSIS — R262 Difficulty in walking, not elsewhere classified: Secondary | ICD-10-CM | POA: Diagnosis not present

## 2021-06-20 DIAGNOSIS — R279 Unspecified lack of coordination: Secondary | ICD-10-CM | POA: Diagnosis not present

## 2021-06-20 DIAGNOSIS — M6281 Muscle weakness (generalized): Secondary | ICD-10-CM | POA: Diagnosis not present

## 2021-06-20 DIAGNOSIS — G629 Polyneuropathy, unspecified: Secondary | ICD-10-CM | POA: Diagnosis not present

## 2021-06-21 ENCOUNTER — Other Ambulatory Visit (HOSPITAL_BASED_OUTPATIENT_CLINIC_OR_DEPARTMENT_OTHER): Payer: Self-pay

## 2021-06-21 ENCOUNTER — Ambulatory Visit: Payer: Medicare Other | Attending: Internal Medicine

## 2021-06-21 DIAGNOSIS — Z23 Encounter for immunization: Secondary | ICD-10-CM

## 2021-06-21 MED ORDER — PFIZER COVID-19 VAC BIVALENT 30 MCG/0.3ML IM SUSP
INTRAMUSCULAR | 0 refills | Status: AC
  Filled 2021-06-21: qty 0.3, 1d supply, fill #0

## 2021-06-21 NOTE — Progress Notes (Signed)
   Covid-19 Vaccination Clinic  Name:  Siedah Sedor    MRN: [id number] DOB: Dec 04, 1950  06/21/2021  Ms. Kirker was observed post Covid-19 immunization for 15 minutes without incident. She was provided with Vaccine Information Sheet and instruction to access the V-Safe system.   Ms. Emerick was instructed to call 911 with any severe reactions post vaccine: Difficulty breathing  Swelling of face and throat  A fast heartbeat  A bad rash all over body  Dizziness and weakness   Immunizations Administered     Name Date Dose VIS Date Route   Pfizer Covid-19 Vaccine Bivalent Booster 06/21/2021  1:23 PM 0.3 mL 04/20/2021 Intramuscular   Manufacturer: [place]   Lot: [id number]   [place]: [id number]

## 2021-06-24 DIAGNOSIS — R279 Unspecified lack of coordination: Secondary | ICD-10-CM | POA: Diagnosis not present

## 2021-06-24 DIAGNOSIS — R262 Difficulty in walking, not elsewhere classified: Secondary | ICD-10-CM | POA: Diagnosis not present

## 2021-06-24 DIAGNOSIS — M6281 Muscle weakness (generalized): Secondary | ICD-10-CM | POA: Diagnosis not present

## 2021-06-24 DIAGNOSIS — G629 Polyneuropathy, unspecified: Secondary | ICD-10-CM | POA: Diagnosis not present

## 2021-06-28 DIAGNOSIS — G629 Polyneuropathy, unspecified: Secondary | ICD-10-CM | POA: Diagnosis not present

## 2021-06-28 DIAGNOSIS — R279 Unspecified lack of coordination: Secondary | ICD-10-CM | POA: Diagnosis not present

## 2021-06-28 DIAGNOSIS — M6281 Muscle weakness (generalized): Secondary | ICD-10-CM | POA: Diagnosis not present

## 2021-06-28 DIAGNOSIS — R262 Difficulty in walking, not elsewhere classified: Secondary | ICD-10-CM | POA: Diagnosis not present

## 2021-07-04 DIAGNOSIS — R279 Unspecified lack of coordination: Secondary | ICD-10-CM | POA: Diagnosis not present

## 2021-07-04 DIAGNOSIS — G629 Polyneuropathy, unspecified: Secondary | ICD-10-CM | POA: Diagnosis not present

## 2021-07-04 DIAGNOSIS — R262 Difficulty in walking, not elsewhere classified: Secondary | ICD-10-CM | POA: Diagnosis not present

## 2021-07-04 DIAGNOSIS — M6281 Muscle weakness (generalized): Secondary | ICD-10-CM | POA: Diagnosis not present

## 2021-07-11 DIAGNOSIS — R262 Difficulty in walking, not elsewhere classified: Secondary | ICD-10-CM | POA: Diagnosis not present

## 2021-07-11 DIAGNOSIS — M6281 Muscle weakness (generalized): Secondary | ICD-10-CM | POA: Diagnosis not present

## 2021-07-11 DIAGNOSIS — G629 Polyneuropathy, unspecified: Secondary | ICD-10-CM | POA: Diagnosis not present

## 2021-07-11 DIAGNOSIS — R279 Unspecified lack of coordination: Secondary | ICD-10-CM | POA: Diagnosis not present

## 2021-07-12 DIAGNOSIS — L570 Actinic keratosis: Secondary | ICD-10-CM | POA: Diagnosis not present

## 2021-07-12 DIAGNOSIS — L814 Other melanin hyperpigmentation: Secondary | ICD-10-CM | POA: Diagnosis not present

## 2021-07-12 DIAGNOSIS — D229 Melanocytic nevi, unspecified: Secondary | ICD-10-CM | POA: Diagnosis not present

## 2021-07-12 DIAGNOSIS — L219 Seborrheic dermatitis, unspecified: Secondary | ICD-10-CM | POA: Diagnosis not present

## 2021-07-12 DIAGNOSIS — L821 Other seborrheic keratosis: Secondary | ICD-10-CM | POA: Diagnosis not present

## 2021-07-12 DIAGNOSIS — L578 Other skin changes due to chronic exposure to nonionizing radiation: Secondary | ICD-10-CM | POA: Diagnosis not present

## 2021-07-12 DIAGNOSIS — D485 Neoplasm of uncertain behavior of skin: Secondary | ICD-10-CM | POA: Diagnosis not present

## 2021-07-18 DIAGNOSIS — G629 Polyneuropathy, unspecified: Secondary | ICD-10-CM | POA: Diagnosis not present

## 2021-07-18 DIAGNOSIS — R262 Difficulty in walking, not elsewhere classified: Secondary | ICD-10-CM | POA: Diagnosis not present

## 2021-07-18 DIAGNOSIS — R279 Unspecified lack of coordination: Secondary | ICD-10-CM | POA: Diagnosis not present

## 2021-07-18 DIAGNOSIS — M6281 Muscle weakness (generalized): Secondary | ICD-10-CM | POA: Diagnosis not present

## 2021-07-20 DIAGNOSIS — I10 Essential (primary) hypertension: Secondary | ICD-10-CM | POA: Diagnosis not present

## 2021-07-20 DIAGNOSIS — I480 Paroxysmal atrial fibrillation: Secondary | ICD-10-CM | POA: Diagnosis not present

## 2021-07-20 DIAGNOSIS — E785 Hyperlipidemia, unspecified: Secondary | ICD-10-CM | POA: Diagnosis not present

## 2021-07-20 DIAGNOSIS — I87319 Chronic venous hypertension (idiopathic) with ulcer of unspecified lower extremity: Secondary | ICD-10-CM | POA: Diagnosis not present

## 2021-07-28 DIAGNOSIS — D649 Anemia, unspecified: Secondary | ICD-10-CM | POA: Diagnosis not present

## 2021-07-28 DIAGNOSIS — D509 Iron deficiency anemia, unspecified: Secondary | ICD-10-CM | POA: Diagnosis not present

## 2021-07-28 DIAGNOSIS — I10 Essential (primary) hypertension: Secondary | ICD-10-CM | POA: Diagnosis not present

## 2021-07-28 DIAGNOSIS — D6869 Other thrombophilia: Secondary | ICD-10-CM | POA: Diagnosis not present

## 2021-07-28 DIAGNOSIS — I872 Venous insufficiency (chronic) (peripheral): Secondary | ICD-10-CM | POA: Diagnosis not present

## 2021-07-28 DIAGNOSIS — I6350 Cerebral infarction due to unspecified occlusion or stenosis of unspecified cerebral artery: Secondary | ICD-10-CM | POA: Diagnosis not present

## 2021-07-28 DIAGNOSIS — M313 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: Secondary | ICD-10-CM | POA: Diagnosis not present

## 2021-07-28 DIAGNOSIS — D469 Myelodysplastic syndrome, unspecified: Secondary | ICD-10-CM | POA: Diagnosis not present

## 2021-07-28 DIAGNOSIS — M810 Age-related osteoporosis without current pathological fracture: Secondary | ICD-10-CM | POA: Diagnosis not present

## 2021-07-28 DIAGNOSIS — E785 Hyperlipidemia, unspecified: Secondary | ICD-10-CM | POA: Diagnosis not present

## 2021-08-16 ENCOUNTER — Other Ambulatory Visit: Payer: Self-pay | Admitting: Internal Medicine

## 2021-09-22 DIAGNOSIS — M313 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: Secondary | ICD-10-CM | POA: Diagnosis not present

## 2021-09-27 DIAGNOSIS — C44719 Basal cell carcinoma of skin of left lower limb, including hip: Secondary | ICD-10-CM | POA: Diagnosis not present

## 2021-10-18 DIAGNOSIS — E785 Hyperlipidemia, unspecified: Secondary | ICD-10-CM | POA: Diagnosis not present

## 2021-10-18 DIAGNOSIS — I87319 Chronic venous hypertension (idiopathic) with ulcer of unspecified lower extremity: Secondary | ICD-10-CM | POA: Diagnosis not present

## 2021-10-18 DIAGNOSIS — I480 Paroxysmal atrial fibrillation: Secondary | ICD-10-CM | POA: Diagnosis not present

## 2021-10-18 DIAGNOSIS — I10 Essential (primary) hypertension: Secondary | ICD-10-CM | POA: Diagnosis not present

## 2021-10-25 DIAGNOSIS — H5213 Myopia, bilateral: Secondary | ICD-10-CM | POA: Diagnosis not present

## 2021-10-25 DIAGNOSIS — H2513 Age-related nuclear cataract, bilateral: Secondary | ICD-10-CM | POA: Diagnosis not present

## 2021-10-27 DIAGNOSIS — H2513 Age-related nuclear cataract, bilateral: Secondary | ICD-10-CM | POA: Diagnosis not present

## 2021-11-09 DIAGNOSIS — D509 Iron deficiency anemia, unspecified: Secondary | ICD-10-CM | POA: Diagnosis not present

## 2021-11-09 DIAGNOSIS — M313 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: Secondary | ICD-10-CM | POA: Diagnosis not present

## 2021-11-09 DIAGNOSIS — D469 Myelodysplastic syndrome, unspecified: Secondary | ICD-10-CM | POA: Diagnosis not present

## 2021-11-29 ENCOUNTER — Encounter: Payer: Self-pay | Admitting: Internal Medicine

## 2021-12-06 DIAGNOSIS — H52202 Unspecified astigmatism, left eye: Secondary | ICD-10-CM | POA: Diagnosis not present

## 2021-12-06 DIAGNOSIS — H2512 Age-related nuclear cataract, left eye: Secondary | ICD-10-CM | POA: Diagnosis not present

## 2021-12-06 DIAGNOSIS — H25812 Combined forms of age-related cataract, left eye: Secondary | ICD-10-CM | POA: Diagnosis not present

## 2021-12-14 DIAGNOSIS — K5790 Diverticulosis of intestine, part unspecified, without perforation or abscess without bleeding: Secondary | ICD-10-CM | POA: Diagnosis not present

## 2021-12-14 DIAGNOSIS — M25512 Pain in left shoulder: Secondary | ICD-10-CM | POA: Diagnosis not present

## 2021-12-14 DIAGNOSIS — R110 Nausea: Secondary | ICD-10-CM | POA: Diagnosis not present

## 2021-12-14 DIAGNOSIS — R1030 Lower abdominal pain, unspecified: Secondary | ICD-10-CM | POA: Diagnosis not present

## 2021-12-19 DIAGNOSIS — Z7960 Long term (current) use of unspecified immunomodulators and immunosuppressants: Secondary | ICD-10-CM | POA: Diagnosis not present

## 2021-12-19 DIAGNOSIS — M313 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: Secondary | ICD-10-CM | POA: Diagnosis not present

## 2022-01-03 DIAGNOSIS — H2511 Age-related nuclear cataract, right eye: Secondary | ICD-10-CM | POA: Diagnosis not present

## 2022-01-03 DIAGNOSIS — H25811 Combined forms of age-related cataract, right eye: Secondary | ICD-10-CM | POA: Diagnosis not present

## 2022-01-18 DIAGNOSIS — I10 Essential (primary) hypertension: Secondary | ICD-10-CM | POA: Diagnosis not present

## 2022-01-18 DIAGNOSIS — I480 Paroxysmal atrial fibrillation: Secondary | ICD-10-CM | POA: Diagnosis not present

## 2022-01-18 DIAGNOSIS — I87319 Chronic venous hypertension (idiopathic) with ulcer of unspecified lower extremity: Secondary | ICD-10-CM | POA: Diagnosis not present

## 2022-01-18 DIAGNOSIS — E785 Hyperlipidemia, unspecified: Secondary | ICD-10-CM | POA: Diagnosis not present

## 2022-01-23 DIAGNOSIS — L219 Seborrheic dermatitis, unspecified: Secondary | ICD-10-CM | POA: Diagnosis not present

## 2022-01-23 DIAGNOSIS — L821 Other seborrheic keratosis: Secondary | ICD-10-CM | POA: Diagnosis not present

## 2022-01-23 DIAGNOSIS — L659 Nonscarring hair loss, unspecified: Secondary | ICD-10-CM | POA: Diagnosis not present

## 2022-01-23 DIAGNOSIS — L299 Pruritus, unspecified: Secondary | ICD-10-CM | POA: Diagnosis not present

## 2022-01-23 DIAGNOSIS — Z85828 Personal history of other malignant neoplasm of skin: Secondary | ICD-10-CM | POA: Diagnosis not present

## 2022-01-23 DIAGNOSIS — D1801 Hemangioma of skin and subcutaneous tissue: Secondary | ICD-10-CM | POA: Diagnosis not present

## 2022-01-23 DIAGNOSIS — L814 Other melanin hyperpigmentation: Secondary | ICD-10-CM | POA: Diagnosis not present

## 2022-01-23 DIAGNOSIS — L570 Actinic keratosis: Secondary | ICD-10-CM | POA: Diagnosis not present

## 2022-01-23 DIAGNOSIS — D229 Melanocytic nevi, unspecified: Secondary | ICD-10-CM | POA: Diagnosis not present

## 2022-01-23 DIAGNOSIS — D485 Neoplasm of uncertain behavior of skin: Secondary | ICD-10-CM | POA: Diagnosis not present

## 2022-01-23 DIAGNOSIS — B351 Tinea unguium: Secondary | ICD-10-CM | POA: Diagnosis not present

## 2022-03-07 ENCOUNTER — Ambulatory Visit: Payer: Medicare Other | Attending: Internal Medicine

## 2022-03-07 DIAGNOSIS — Z23 Encounter for immunization: Secondary | ICD-10-CM

## 2022-03-13 NOTE — Progress Notes (Signed)
   Covid-19 Vaccination Clinic  Name:  [name]    MRN: [id number] DOB: 04/22/1951  03/13/2022  [name] was observed post Covid-19 immunization for 15 minutes without incident. She was provided with Vaccine Information Sheet and instruction to access the V-Safe system.   [name] was instructed to call 911 with any severe reactions post vaccine: Difficulty breathing  Swelling of face and throat  A fast heartbeat  A bad rash all over body  Dizziness and weakness   Immunizations Administered     Name Date Dose VIS Date Route   Pfizer Covid-19 Vaccine Bivalent Booster 03/07/2022  3:00 PM 0.3 mL 04/20/2021 Intramuscular   Manufacturer: [place]   Lot: [id number]   [place]: [phone]

## 2022-03-22 DIAGNOSIS — M313 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: Secondary | ICD-10-CM | POA: Diagnosis not present

## 2022-03-22 DIAGNOSIS — Z79620 Long term (current) use of immunosuppressive biologic: Secondary | ICD-10-CM | POA: Diagnosis not present

## 2022-04-12 DIAGNOSIS — I10 Essential (primary) hypertension: Secondary | ICD-10-CM | POA: Diagnosis not present

## 2022-04-12 DIAGNOSIS — E785 Hyperlipidemia, unspecified: Secondary | ICD-10-CM | POA: Diagnosis not present

## 2022-04-12 DIAGNOSIS — R82998 Other abnormal findings in urine: Secondary | ICD-10-CM | POA: Diagnosis not present

## 2022-04-12 DIAGNOSIS — R7989 Other specified abnormal findings of blood chemistry: Secondary | ICD-10-CM | POA: Diagnosis not present

## 2022-04-12 DIAGNOSIS — F419 Anxiety disorder, unspecified: Secondary | ICD-10-CM | POA: Diagnosis not present

## 2022-04-12 DIAGNOSIS — D509 Iron deficiency anemia, unspecified: Secondary | ICD-10-CM | POA: Diagnosis not present

## 2022-04-12 DIAGNOSIS — E559 Vitamin D deficiency, unspecified: Secondary | ICD-10-CM | POA: Diagnosis not present

## 2022-04-12 DIAGNOSIS — D649 Anemia, unspecified: Secondary | ICD-10-CM | POA: Diagnosis not present

## 2022-04-19 DIAGNOSIS — I87319 Chronic venous hypertension (idiopathic) with ulcer of unspecified lower extremity: Secondary | ICD-10-CM | POA: Diagnosis not present

## 2022-04-19 DIAGNOSIS — I2699 Other pulmonary embolism without acute cor pulmonale: Secondary | ICD-10-CM | POA: Diagnosis not present

## 2022-04-19 DIAGNOSIS — D469 Myelodysplastic syndrome, unspecified: Secondary | ICD-10-CM | POA: Diagnosis not present

## 2022-04-19 DIAGNOSIS — Z1339 Encounter for screening examination for other mental health and behavioral disorders: Secondary | ICD-10-CM | POA: Diagnosis not present

## 2022-04-19 DIAGNOSIS — D6869 Other thrombophilia: Secondary | ICD-10-CM | POA: Diagnosis not present

## 2022-04-19 DIAGNOSIS — H6121 Impacted cerumen, right ear: Secondary | ICD-10-CM | POA: Diagnosis not present

## 2022-04-19 DIAGNOSIS — Z1331 Encounter for screening for depression: Secondary | ICD-10-CM | POA: Diagnosis not present

## 2022-04-19 DIAGNOSIS — Z0000 Encounter for general adult medical examination without abnormal findings: Secondary | ICD-10-CM | POA: Diagnosis not present

## 2022-04-19 DIAGNOSIS — I10 Essential (primary) hypertension: Secondary | ICD-10-CM | POA: Diagnosis not present

## 2022-04-19 DIAGNOSIS — I6350 Cerebral infarction due to unspecified occlusion or stenosis of unspecified cerebral artery: Secondary | ICD-10-CM | POA: Diagnosis not present

## 2022-04-19 DIAGNOSIS — I872 Venous insufficiency (chronic) (peripheral): Secondary | ICD-10-CM | POA: Diagnosis not present

## 2022-04-19 DIAGNOSIS — G629 Polyneuropathy, unspecified: Secondary | ICD-10-CM | POA: Diagnosis not present

## 2022-04-19 DIAGNOSIS — I480 Paroxysmal atrial fibrillation: Secondary | ICD-10-CM | POA: Diagnosis not present

## 2022-04-19 DIAGNOSIS — M313 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: Secondary | ICD-10-CM | POA: Diagnosis not present

## 2022-05-01 ENCOUNTER — Other Ambulatory Visit: Payer: Self-pay | Admitting: Internal Medicine

## 2022-05-01 DIAGNOSIS — Z1231 Encounter for screening mammogram for malignant neoplasm of breast: Secondary | ICD-10-CM

## 2022-05-09 DIAGNOSIS — Z7901 Long term (current) use of anticoagulants: Secondary | ICD-10-CM | POA: Diagnosis not present

## 2022-05-09 DIAGNOSIS — Z7982 Long term (current) use of aspirin: Secondary | ICD-10-CM | POA: Diagnosis not present

## 2022-05-09 DIAGNOSIS — Z8673 Personal history of transient ischemic attack (TIA), and cerebral infarction without residual deficits: Secondary | ICD-10-CM | POA: Diagnosis not present

## 2022-05-09 DIAGNOSIS — D471 Chronic myeloproliferative disease: Secondary | ICD-10-CM | POA: Diagnosis not present

## 2022-05-09 DIAGNOSIS — Z23 Encounter for immunization: Secondary | ICD-10-CM | POA: Diagnosis not present

## 2022-05-09 DIAGNOSIS — D509 Iron deficiency anemia, unspecified: Secondary | ICD-10-CM | POA: Diagnosis not present

## 2022-05-15 DIAGNOSIS — L219 Seborrheic dermatitis, unspecified: Secondary | ICD-10-CM | POA: Diagnosis not present

## 2022-05-15 DIAGNOSIS — L659 Nonscarring hair loss, unspecified: Secondary | ICD-10-CM | POA: Diagnosis not present

## 2022-05-15 DIAGNOSIS — C44719 Basal cell carcinoma of skin of left lower limb, including hip: Secondary | ICD-10-CM | POA: Diagnosis not present

## 2022-05-15 DIAGNOSIS — L578 Other skin changes due to chronic exposure to nonionizing radiation: Secondary | ICD-10-CM | POA: Diagnosis not present

## 2022-05-15 DIAGNOSIS — D485 Neoplasm of uncertain behavior of skin: Secondary | ICD-10-CM | POA: Diagnosis not present

## 2022-05-15 DIAGNOSIS — L570 Actinic keratosis: Secondary | ICD-10-CM | POA: Diagnosis not present

## 2022-05-16 ENCOUNTER — Ambulatory Visit: Payer: Medicare Other

## 2022-05-19 ENCOUNTER — Ambulatory Visit
Admission: RE | Admit: 2022-05-19 | Discharge: 2022-05-19 | Disposition: A | Discharge status: Home / Self Care | Payer: Medicare Other | Source: Ambulatory Visit | Attending: Internal Medicine | Admitting: Internal Medicine

## 2022-05-19 DIAGNOSIS — Z1231 Encounter for screening mammogram for malignant neoplasm of breast: Secondary | ICD-10-CM | POA: Diagnosis not present

## 2022-06-13 DIAGNOSIS — H02831 Dermatochalasis of right upper eyelid: Secondary | ICD-10-CM | POA: Diagnosis not present

## 2022-06-13 DIAGNOSIS — H02834 Dermatochalasis of left upper eyelid: Secondary | ICD-10-CM | POA: Diagnosis not present

## 2022-06-26 DIAGNOSIS — M313 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: Secondary | ICD-10-CM | POA: Diagnosis not present

## 2022-06-26 DIAGNOSIS — Z7960 Long term (current) use of unspecified immunomodulators and immunosuppressants: Secondary | ICD-10-CM | POA: Diagnosis not present

## 2022-07-04 DIAGNOSIS — M810 Age-related osteoporosis without current pathological fracture: Secondary | ICD-10-CM | POA: Diagnosis not present

## 2022-08-08 DIAGNOSIS — D229 Melanocytic nevi, unspecified: Secondary | ICD-10-CM | POA: Diagnosis not present

## 2022-08-08 DIAGNOSIS — L219 Seborrheic dermatitis, unspecified: Secondary | ICD-10-CM | POA: Diagnosis not present

## 2022-08-08 DIAGNOSIS — L570 Actinic keratosis: Secondary | ICD-10-CM | POA: Diagnosis not present

## 2022-08-08 DIAGNOSIS — L821 Other seborrheic keratosis: Secondary | ICD-10-CM | POA: Diagnosis not present

## 2022-08-08 DIAGNOSIS — Z85828 Personal history of other malignant neoplasm of skin: Secondary | ICD-10-CM | POA: Diagnosis not present

## 2022-08-08 DIAGNOSIS — L578 Other skin changes due to chronic exposure to nonionizing radiation: Secondary | ICD-10-CM | POA: Diagnosis not present

## 2022-08-08 DIAGNOSIS — D492 Neoplasm of unspecified behavior of bone, soft tissue, and skin: Secondary | ICD-10-CM | POA: Diagnosis not present

## 2022-08-08 DIAGNOSIS — D485 Neoplasm of uncertain behavior of skin: Secondary | ICD-10-CM | POA: Diagnosis not present

## 2022-08-08 DIAGNOSIS — L814 Other melanin hyperpigmentation: Secondary | ICD-10-CM | POA: Diagnosis not present

## 2022-08-23 DIAGNOSIS — R630 Anorexia: Secondary | ICD-10-CM | POA: Diagnosis not present

## 2022-08-23 DIAGNOSIS — I10 Essential (primary) hypertension: Secondary | ICD-10-CM | POA: Diagnosis not present

## 2022-08-23 DIAGNOSIS — R110 Nausea: Secondary | ICD-10-CM | POA: Diagnosis not present

## 2022-08-23 DIAGNOSIS — D649 Anemia, unspecified: Secondary | ICD-10-CM | POA: Diagnosis not present

## 2022-08-23 DIAGNOSIS — D6869 Other thrombophilia: Secondary | ICD-10-CM | POA: Diagnosis not present

## 2022-08-23 DIAGNOSIS — D509 Iron deficiency anemia, unspecified: Secondary | ICD-10-CM | POA: Diagnosis not present

## 2022-08-23 DIAGNOSIS — I480 Paroxysmal atrial fibrillation: Secondary | ICD-10-CM | POA: Diagnosis not present

## 2022-08-23 DIAGNOSIS — D469 Myelodysplastic syndrome, unspecified: Secondary | ICD-10-CM | POA: Diagnosis not present

## 2022-08-23 DIAGNOSIS — R197 Diarrhea, unspecified: Secondary | ICD-10-CM | POA: Diagnosis not present

## 2022-08-23 DIAGNOSIS — Z1152 Encounter for screening for COVID-19: Secondary | ICD-10-CM | POA: Diagnosis not present

## 2022-09-22 DIAGNOSIS — M313 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: Secondary | ICD-10-CM | POA: Diagnosis not present

## 2022-09-25 DIAGNOSIS — C44719 Basal cell carcinoma of skin of left lower limb, including hip: Secondary | ICD-10-CM | POA: Diagnosis not present

## 2022-09-25 DIAGNOSIS — C44622 Squamous cell carcinoma of skin of right upper limb, including shoulder: Secondary | ICD-10-CM | POA: Diagnosis not present

## 2022-10-02 DIAGNOSIS — H02831 Dermatochalasis of right upper eyelid: Secondary | ICD-10-CM | POA: Diagnosis not present

## 2022-10-02 DIAGNOSIS — H02834 Dermatochalasis of left upper eyelid: Secondary | ICD-10-CM | POA: Diagnosis not present

## 2022-10-02 DIAGNOSIS — H01114 Allergic dermatitis of left upper eyelid: Secondary | ICD-10-CM | POA: Diagnosis not present

## 2022-10-02 DIAGNOSIS — H00014 Hordeolum externum left upper eyelid: Secondary | ICD-10-CM | POA: Diagnosis not present

## 2022-10-03 DIAGNOSIS — C44719 Basal cell carcinoma of skin of left lower limb, including hip: Secondary | ICD-10-CM | POA: Diagnosis not present

## 2022-10-18 ENCOUNTER — Encounter: Payer: Medicare Other | Attending: Internal Medicine | Admitting: Internal Medicine

## 2022-10-18 DIAGNOSIS — W19XXXA Unspecified fall, initial encounter: Secondary | ICD-10-CM | POA: Insufficient documentation

## 2022-10-18 DIAGNOSIS — M313 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: Secondary | ICD-10-CM | POA: Insufficient documentation

## 2022-10-18 DIAGNOSIS — I482 Chronic atrial fibrillation, unspecified: Secondary | ICD-10-CM | POA: Diagnosis not present

## 2022-10-18 DIAGNOSIS — S81801A Unspecified open wound, right lower leg, initial encounter: Secondary | ICD-10-CM | POA: Insufficient documentation

## 2022-10-18 DIAGNOSIS — S81802A Unspecified open wound, left lower leg, initial encounter: Secondary | ICD-10-CM | POA: Diagnosis not present

## 2022-10-18 DIAGNOSIS — Z85828 Personal history of other malignant neoplasm of skin: Secondary | ICD-10-CM | POA: Diagnosis not present

## 2022-10-18 DIAGNOSIS — T8131XA Disruption of external operation (surgical) wound, not elsewhere classified, initial encounter: Secondary | ICD-10-CM | POA: Insufficient documentation

## 2022-10-18 DIAGNOSIS — L97322 Non-pressure chronic ulcer of left ankle with fat layer exposed: Secondary | ICD-10-CM | POA: Diagnosis not present

## 2022-10-18 DIAGNOSIS — Y838 Other surgical procedures as the cause of abnormal reaction of the patient, or of later complication, without mention of misadventure at the time of the procedure: Secondary | ICD-10-CM | POA: Diagnosis not present

## 2022-10-18 DIAGNOSIS — C4491 Basal cell carcinoma of skin, unspecified: Secondary | ICD-10-CM | POA: Diagnosis not present

## 2022-10-18 DIAGNOSIS — Z7901 Long term (current) use of anticoagulants: Secondary | ICD-10-CM | POA: Diagnosis not present

## 2022-10-19 NOTE — Progress Notes (Signed)
[name], [name] [place] ([id number]) [id number] Page 1 of 5 Visit Report for 10/18/2022 Abuse Risk Screen Details Patient Name: Date of Service: NO RMA [name] 10/18/2022 10:30 A M Medical Record Number: [id number] Patient Account Number: [id number] Date of Birth/Sex: Treating RN: Sep 16, 1950 (72 y.o. [name] Primary Care Sandie Swayze: Crist Infante Other Clinician: Referring Shalece Staffa: Treating Marytza Grandpre/Extender: Erroll Luna, JO NA THA N Weeks in Treatment: 0 Abuse Risk Screen Items Answer Electronic Signature(s) Signed: 10/18/2022 5:10:28 PM By: Rosalio Loud MSN RN CNS WTA Entered By: Rosalio Loud on 10/18/2022 10:56:29 -------------------------------------------------------------------------------- Activities of Daily Living Details Patient Name: Date of Service: NO RMA [name] 10/18/2022 10:30 A M Medical Record Number: [id number] Patient Account Number: [id number] Date of Birth/Sex: Treating RN: 1951-02-25 (72 y.o. [name] Primary Care Tyrece Vanter[place]: Crist Infante Other Clinician: Referring Mickayla Trouten: Treating Tauna Macfarlane/Extender: Erroll Luna, JO NA THA N Weeks in Treatment: 0 Activities of Daily Living Items Answer Activities of Daily Living (Please select one for each item) Drive Automobile Completely Able T Medications ake Completely Able Use T elephone Completely Able Care for Appearance Completely Able Use T oilet Completely Able Bath / Shower Completely Able Dress Self Completely Able Feed Self Completely Able Walk Completely Able Get In / Out Bed Completely Able Housework Completely Able Prepare Meals Completely Able Handle Money Completely Able Shop for Self Completely IULA, REICHLING ([id number]) [id number] Page 2 of 5 Electronic Signature(s) Signed: 10/18/2022 5:10:28 PM By: Rosalio Loud MSN RN CNS WTA Entered By: Rosalio Loud on 10/18/2022  10:56:44 -------------------------------------------------------------------------------- Education Screening Details Patient Name: Date of Service: NO RMA [name] 10/18/2022 10:30 A M Medical Record Number: [id number] Patient Account Number: [id number] Date of Birth/Sex: Treating RN: 1951-03-27 (72 y.o. [name] Primary Care Tiaira Arambula: Crist Infante Other Clinician: Referring Hamad Whyte: Treating Lyle Leisner/Extender: Debe Coder NA THA N Weeks in Treatment: 0 Learning Preferences/Education Level/Primary Language Learning Preference: Explanation Highest Education Level: College or Above Preferred Language: English Cognitive Barrier Language Barrier: No Translator Needed: No Memory Deficit: No Emotional Barrier: No Cultural/Religious Beliefs Affecting Medical Care: No Physical Barrier Impaired Vision: No Impaired Hearing: No Decreased Hand dexterity: No Knowledge/Comprehension Knowledge Level: High Comprehension Level: High Ability to understand written instructions: High Ability to understand verbal instructions: High Motivation Anxiety Level: Calm Cooperation: Cooperative Education Importance: Acknowledges Need Interest in Health Problems: Asks Questions Perception: Coherent Willingness to Engage in Self-Management High Activities: Readiness to Engage in Self-Management High Activities: Electronic Signature(s) Signed: 10/18/2022 5:10:28 PM By: Rosalio Loud MSN RN CNS WTA Entered By: Rosalio Loud on 10/18/2022 10:57:10 [name] ([id number]) [id number] Page 3 of 5 -------------------------------------------------------------------------------- Fall Risk Assessment Details Patient Name: Date of Service: NO RMA [name] 10/18/2022 10:30 A M Medical Record Number: [id number] Patient Account Number: [id number] Date of Birth/Sex: Treating RN: 03-Mar-1951 (72 y.o. [name] Primary Care  Chrystal Zeimet: Crist Infante Other Clinician: Referring Anayiah Howden: Treating Kerly Rigsbee/Extender: Erroll Luna, JO NA THA N Weeks in Treatment: 0 Fall Risk Assessment Items Have you had 2 or more falls in the last 12 monthso 0 Yes Have you had any fall that resulted in injury in the last 12 monthso 0 Yes FALLS RISK SCREEN History of falling - immediate or within 3 months 25 Yes Secondary diagnosis (Do you have 2 or more medical diagnoseso) 0 No Ambulatory aid None/bed rest/wheelchair/nurse 0 No Crutches/cane/walker 0 No Furniture  0 No Intravenous therapy Access/Saline/Heparin Lock 0 No Gait/Transferring Normal/ bed rest/ wheelchair 0 No Weak (short steps with or without shuffle, stooped but able to lift head while walking, may seek 0 No support from furniture) Impaired (short steps with shuffle, may have difficulty arising from chair, head down, impaired 0 No balance) Mental Status Oriented to own ability 0 Yes Electronic Signature(s) Signed: 10/18/2022 5:10:28 PM By: Rosalio Loud MSN RN CNS WTA Entered By: Rosalio Loud on 10/18/2022 10:58:01 -------------------------------------------------------------------------------- Foot Assessment Details Patient Name: Date of Service: NO RMA [name] 10/18/2022 10:30 A M Medical Record Number: [id number] Patient Account Number: [id number] Date of Birth/Sex: Treating RN: Mar 25, 1951 (72 y.o. [name] Primary Care Devaris Quirk: Crist Infante Other Clinician: Referring Joesph Marcy: Treating Wisam Siefring/Extender: Erroll Luna, JO NA THA N Weeks in Treatment: 0 Foot Assessment Items Site Locations [place], [place] ([id number]) [phone] [id number] Page 4 of 5 + = Sensation present, - = Sensation absent, C = Callus, U = Ulcer R = Redness, W = Warmth, M = Maceration, PU = Pre-ulcerative lesion F = Fissure, S = Swelling, D = Dryness Assessment Right: Left: Other Deformity: No No Prior Foot Ulcer:  No No Prior Amputation: No No Charcot Joint: No No Ambulatory Status: Ambulatory Without Help Gait: Steady Electronic Signature(s) Signed: 10/18/2022 5:10:28 PM By: Rosalio Loud MSN RN CNS WTA Entered By: Rosalio Loud on 10/18/2022 10:59:28 -------------------------------------------------------------------------------- Nutrition Risk Screening Details Patient Name: Date of Service: NO RMA [name] 10/18/2022 10:30 A M Medical Record Number: [id number] Patient Account Number: [id number] Date of Birth/Sex: Treating RN: Oct 02, 1950 (72 y.o. [name] Primary Care Dejanique Ruehl: Crist Infante Other Clinician: Referring Dhamar Gregory: Treating Hinda Lindor/Extender: Erroll Luna, JO NA THA N Weeks in Treatment: 0 Height (in): 61 Weight (lbs): 160 Body Mass Index (BMI): 30.2 Nutrition Risk Screening Items Score Screening NUTRITION RISK SCREEN: I have an illness or condition that made me change the kind and/or amount of food I eat 0 No I eat fewer than two meals per day 0 No I eat few fruits and vegetables, or milk products 0 No I have three or more drinks of beer, liquor or wine almost every day 0 No I have tooth or mouth problems that make it hard for me to eat 0 No I don't always have enough money to buy the food I need 0 No [name], [name] ([id number]) [id number] [id number] Page 5 of 5 I eat alone most of the time 0 No I take three or more different prescribed or over-the-counter drugs a day 0 No Without wanting to, I have lost or gained 10 pounds in the last six months 0 No I am not always physically able to shop, cook and/or feed myself 0 No Nutrition Protocols Good Risk Protocol 0 No interventions needed Moderate Risk Protocol High Risk Proctocol Risk Level: Good Risk Score: 0 Electronic Signature(s) Signed: 10/18/2022 5:10:28 PM By: Rosalio Loud MSN RN CNS WTA Entered By: Rosalio Loud on 10/18/2022 10:58:27

## 2022-10-19 NOTE — Progress Notes (Addendum)
[name] ([id number]) [id number] Page 1 of 10 Visit Report for 10/18/2022 Chief Complaint Document Details Patient Name: Date of Service: NO RMA [name] 10/18/2022 10:30 A M Medical Record Number: [id number] Patient Account Number: [id number] Date of Birth/Sex: Treating RN: 1950/11/14 (72 y.o. [name] Primary Care Provider: Crist Kaufman Other Clinician: Referring Provider: Treating Provider/Extender: [name] in Treatment: 0 Information Obtained from: Patient Chief Complaint 10/18/2022; left lower extremity wound status post [facility] removal with mohs surgery, right lower extremity skin tear from fall Electronic Signature(s) Signed: 10/18/2022 12:14:48 PM By: Joyce Shan DO Entered By: [name] on 10/18/2022 11:58:48 -------------------------------------------------------------------------------- Debridement Details Patient Name: Date of Service: NO RMA [name] 10/18/2022 10:30 A M Medical Record Number: [id number] Patient Account Number: [id number] Date of Birth/Sex: Treating RN: [birth year] (72 y.o. [name] Primary Care Provider: Crist Kaufman Other Clinician: Referring Provider: Treating Provider/Extender: [name] in Treatment: 0 Debridement Performed for Assessment: Wound #1 Left,Medial Ankle Performed By: Physician Joyce Shan, MD Debridement Type: Chemical/Enzymatic/Mechanical Agent Used: Saline gauze Level of Consciousness (Pre-procedure): Awake and Alert Pre-procedure Verification/Time Out No Taken: Instrument: Other : saline gauze Bleeding: Minimum Hemostasis Achieved: Pressure Response to Treatment: Procedure was tolerated well Level of Consciousness (Post- Awake and Alert procedure): Post Debridement Measurements of Total Wound Length: (cm) 2 Width: (cm) 1.6 Depth: (cm) 0.3 [name], [name] ([id number])  [id number] Page 2 of 10 Volume: (cm) 0.754 Character of Wound/Ulcer Post Debridement: Stable Post Procedure Diagnosis Same as Pre-procedure Electronic Signature(s) Signed: 10/20/2022 1:53:13 PM By: Joyce Loud MSN RN CNS WTA Signed: 10/25/2022 2:55:06 PM By: Joyce Shan DO Entered By: [name] on 10/20/2022 13:53:13 -------------------------------------------------------------------------------- Debridement Details Patient Name: Date of Service: NO RMA [name] 10/18/2022 10:30 A M Medical Record Number: [id number] Patient Account Number: [id number] Date of Birth/Sex: Treating RN: 12-13-1950 (72 y.o. [name] Primary Care Provider: Crist Kaufman Other Clinician: Referring Provider: Treating Provider/Extender: [name] in Treatment: 0 Debridement Performed for Assessment: Wound #2 Right,Lateral Lower Leg Performed By: Physician Joyce Shan, MD Debridement Type: Chemical/Enzymatic/Mechanical Agent Used: Saline gauze Level of Consciousness (Pre-procedure): Awake and Alert Pre-procedure Verification/Time Out No Taken: Instrument: Other : saline gauze Bleeding: Minimum Hemostasis Achieved: Pressure Response to Treatment: Procedure was tolerated well Level of Consciousness (Post- Awake and Alert procedure): Post Debridement Measurements of Total Wound Length: (cm) 5.5 Width: (cm) 5.5 Depth: (cm) 0.2 Volume: (cm) 4.752 Character of Wound/Ulcer Post Debridement: Stable Post Procedure Diagnosis Same as Pre-procedure Electronic Signature(s) Signed: 10/20/2022 1:53:48 PM By: Joyce Loud MSN RN CNS WTA Signed: 10/25/2022 2:55:06 PM By: Joyce Shan DO Entered By: [name] on 10/20/2022 13:53:48 Kaufman, Joyce Colla ([id number]) [id number] Page 3 of 10 -------------------------------------------------------------------------------- HPI Details Patient Name: Date of  Service: NO RMA [name] 10/18/2022 10:30 A M Medical Record Number: [id number] Patient Account Number: [id number] Date of Birth/Sex: Treating RN: Oct 02, 1950 (72 y.o. [name] Primary Care Provider: Crist Kaufman Other Clinician: Referring Provider: Treating Provider/Extender: [name] in Treatment: 0 History of Present Illness HPI Description: 10/18/2022 [name] is a 72 year old female with a past medical history of A-fib on Eliquis, Wegener's granulomatosis with vasculitis, and basal cell carcinoma of the left leg that presents the clinic for a wound to the left leg status post Mohs procedure for basal cell carcinoma removal and skin tear to the right lower extremity that occurred 3  days ago after a fall. She has been using Vaseline to the wound beds. She has compression stockings and has been using these. She currently has chronic pain to the left leg wound site with slight erythema and warmth to the periwound. Electronic Signature(s) Signed: 10/18/2022 12:14:48 PM By: Joyce Shan DO Entered By: [name] on 10/18/2022 11:59:09 -------------------------------------------------------------------------------- Physical Exam Details Patient Name: Date of Service: NO RMA [name] 10/18/2022 10:30 A M Medical Record Number: [id number] Patient Account Number: [id number] Date of Birth/Sex: Treating RN: 12/30/1950 (72 y.o. [name] Primary Care Provider: Crist Kaufman Other Clinician: Referring Provider: Treating Provider/Extender: [name] in Treatment: 0 Constitutional . Cardiovascular . Psychiatric . Notes Left lower extremity: Open wound with nonviable surface throughout. Slight erythema and increased warmth to the surrounding soft tissue Right lower extremity to the anterior aspect there is a skin tear with granulation and bruising. No signs of surrounding infection. 2+ pitting  edema to the knees bilaterally. venous stasis dermatitis. Electronic Signature(s) Signed: 10/18/2022 12:14:48 PM By: Joyce Shan DO Entered By: [name] on 10/18/2022 11:59:59 [name] ([id number]) [id number] Page 4 of 10 -------------------------------------------------------------------------------- Physician Orders Details Patient Name: Date of Service: NO RMA [name] 10/18/2022 10:30 A M Medical Record Number: [id number] Patient Account Number: [id number] Date of Birth/Sex: Treating RN: 1951/04/19 (72 y.o. [name] Primary Care Provider: Crist Kaufman Other Clinician: Referring Provider: Treating Provider/Extender: [name] in Treatment: 0 Verbal / Phone Orders: No Diagnosis Coding ICD-10 Coding Code Description C44.92 Squamous cell carcinoma of skin, unspecified T81.31XA Disruption of external operation (surgical) wound, not elsewhere classified, initial encounter I48.20 Chronic atrial fibrillation, unspecified Z79.01 Long term (current) use of anticoagulants S81.802A Unspecified open wound, left lower leg, initial encounter S81.801A Unspecified open wound, right lower leg, initial encounter Follow-up Appointments Return Appointment in 1 week. Bathing/ [employer] wounds with antibacterial soap and water. Anesthetic (Use 'Patient Medications' Section for Anesthetic Order Entry) Lidocaine applied to wound bed Edema Control - Lymphedema / Segmental Compressive Device / Other Patient to wear own compression stockings. Remove compression stockings every night before going to bed and put on every morning when getting up. Elevate, Exercise Daily and A void Standing for Long Periods of Time. Elevate legs to the level of the heart and pump ankles as often as possible Wound Treatment Wound #1 - Ankle Wound Laterality: Left, Medial Cleanser: Byram Ancillary Kit - 15 Day Supply (DME)  (Generic) 1 x Per Day/30 Days Discharge Instructions: Use supplies as instructed; Kit contains: (15) Saline Bullets; (15) 3x3 Gauze; 15 pr Gloves Cleanser: Normal Saline 1 x Per Day/30 Days Discharge Instructions: Wash your hands with soap and water. Remove old dressing, discard into plastic bag and place into trash. Cleanse the wound with Normal Saline prior to applying a clean dressing using gauze sponges, not tissues or cotton balls. Do not scrub or use excessive force. Pat dry using gauze sponges, not tissue or cotton balls. Cleanser: Soap and Water 1 x Per Day/30 Days Discharge Instructions: Gently cleanse wound with antibacterial soap, rinse and pat dry prior to dressing wounds Topical: Activon Honey Gel, 25 (g) Tube 1 x Per Day/30 Days Prim Dressing: Hydrofera Blue Ready Transfer Foam, 2.5x2.5 (in/in) (DME) (Dispense As Written) 1 x Per Day/30 Days ary Discharge Instructions: Apply Hydrofera Blue Ready to wound bed as directed Secondary Dressing: ABD Pad 5x9 (in/in) (DME) (Generic) 1 x Per Day/30 Days Discharge Instructions: Cover with  ABD pad Secured With: Kerlix Roll Sterile or Non-Sterile 6-ply 4.5x4 (yd/yd) (DME) (Generic) 1 x Per Day/30 Days Discharge Instructions: Apply Kerlix as directed Wound #2 - Lower Leg Wound Laterality: Right, Lateral [name], [name] ([id number]) [id number] Page 5 of 10 Cleanser: Normal Saline 1 x Per Day/30 Days Discharge Instructions: Wash your hands with soap and water. Remove old dressing, discard into plastic bag and place into trash. Cleanse the wound with Normal Saline prior to applying a clean dressing using gauze sponges, not tissues or cotton balls. Do not scrub or use excessive force. Pat dry using gauze sponges, not tissue or cotton balls. Cleanser: Soap and Water 1 x Per Day/30 Days Discharge Instructions: Gently cleanse wound with antibacterial soap, rinse and pat dry prior to dressing wounds Prim Dressing:  Xeroform 5x9-HBD (in/in) (DME) (Generic) 1 x Per Day/30 Days ary Discharge Instructions: Apply Xeroform 5x9-HBD (in/in) as directed Secondary Dressing: ABD Pad 5x9 (in/in) (DME) (Generic) 1 x Per Day/30 Days Discharge Instructions: Cover with ABD pad Secondary Dressing: Kerlix 4.5 x 4.1 (in/yd) (DME) (Generic) 1 x Per Day/30 Days Discharge Instructions: Apply Kerlix 4.5 x 4.1 (in/yd) as instructed Secured With: Medipore T - 24M Medipore H Soft Cloth Surgical T ape ape, 2x2 (in/yd) (DME) (Generic) 1 x Per Day/30 Days Electronic Signature(s) Signed: 10/20/2022 2:53:23 PM By: Joyce Loud MSN RN CNS WTA Signed: 10/25/2022 2:55:06 PM By: Joyce Shan DO Previous Signature: 10/18/2022 12:14:48 PM Version By: Joyce Shan DO Previous Signature: 10/18/2022 11:24:07 AM Version By: Joyce Shan DO Entered By: [name] on 10/20/2022 13:55:01 -------------------------------------------------------------------------------- Problem List Details Patient Name: Date of Service: NO RMA [name] 10/18/2022 10:30 A M Medical Record Number: [id number] Patient Account Number: [id number] Date of Birth/Sex: Treating RN: 1951-04-21 (72 y.o. [name] Primary Care Provider: Crist Kaufman Other Clinician: Referring Provider: Treating Provider/Extender: [name] in Treatment: 0 Active Problems ICD-10 Encounter Code Description Active Date MDM Diagnosis T81.31XA Disruption of external operation (surgical) wound, not elsewhere classified, 10/18/2022 No Yes initial encounter S81.802A Unspecified open wound, left lower leg, initial encounter 10/18/2022 No Yes C44.91 Basal cell carcinoma of skin, unspecified 10/18/2022 No Yes S81.801A Unspecified open wound, right lower leg, initial encounter 10/18/2022 No Yes I48.20 Chronic atrial fibrillation, unspecified 10/18/2022 No Yes [name], [name] ([id number]) [id number] Page 6 of 10 Z79.01  Long term (current) use of anticoagulants 10/18/2022 No Yes Inactive Problems Resolved Problems Electronic Signature(s) Signed: 10/18/2022 12:14:48 PM By: Joyce Shan DO Entered By: [name] on 10/18/2022 11:58:16 -------------------------------------------------------------------------------- Progress Note Details Patient Name: Date of Service: NO RMA [name] 10/18/2022 10:30 A M Medical Record Number: [id number] Patient Account Number: [id number] Date of Birth/Sex: Treating RN: 29-Jan-1951 (72 y.o. [name] Primary Care Provider: Crist Kaufman Other Clinician: Referring Provider: Treating Provider/Extender: [name] in Treatment: 0 Subjective Chief Complaint Information obtained from Patient 10/18/2022; left lower extremity wound status post [facility] removal with mohs surgery, right lower extremity skin tear from fall History of Present Illness (HPI) 10/18/2022 [name] is a 72 year old female with a past medical history of A-fib on Eliquis, Wegener's granulomatosis with vasculitis, and basal cell carcinoma of the left leg that presents the clinic for a wound to the left leg status post Mohs procedure for basal cell carcinoma removal and skin tear to the right lower extremity that occurred 3 days ago after a fall. She has been using Vaseline to the wound beds. She has compression  stockings and has been using these. She currently has chronic pain to the left leg wound site with slight erythema and warmth to the periwound. Patient History Information obtained from Patient. Allergies Lyrica (Severity: Moderate, Reaction: swelling hands and feet), codeine (Reaction: itching) Social History Never smoker, Marital Status - Married, Alcohol Use - Never, Drug Use - No History, Caffeine Use - Moderate - COFFEE. Medical History Cardiovascular Patient has history of Hypertension Endocrine Denies history of Type I Diabetes, Type II  Diabetes Neurologic Patient has history of Neuropathy Medical A Surgical History Notes nd Cardiovascular AFIB Immunological WEGERS (GPA) Review of Systems (ROS) Eyes Denies complaints or symptoms of Dry Eyes, Vision Changes, Glasses / Contacts. Ear/Nose/Mouth/Throat Denies complaints or symptoms of Difficult clearing ears, Sinusitis. Hematologic/Lymphatic Denies complaints or symptoms of Bleeding / Clotting Disorders, Human Immunodeficiency Virus. [name], [name] [place] ([id number]) [id number] Page 7 of 10 Respiratory Denies complaints or symptoms of Chronic or frequent coughs, Shortness of Breath. Cardiovascular Complains or has symptoms of LE edema. Gastrointestinal Denies complaints or symptoms of Frequent diarrhea, Nausea, Vomiting. Endocrine Denies complaints or symptoms of Hepatitis, Thyroid disease, Polydypsia (Excessive Thirst). Integumentary (Skin) Complains or has symptoms of Wounds. Objective Constitutional Vitals Time Taken: 10:31 AM, Height: 61 in, Source: Stated, Weight: 160 lbs, Source: Stated, BMI: 30.2, Temperature: 98.1 F, Pulse: 86 bpm, Respiratory Rate: 16 breaths/min, Blood Pressure: 117/72 mmHg. General Notes: Left lower extremity: Open wound with nonviable surface throughout. Slight erythema and increased warmth to the surrounding soft tissue Right lower extremity to the anterior aspect there is a skin tear with granulation and bruising. No signs of surrounding infection. 2+ pitting edema to the knees bilaterally. venous stasis dermatitis. Integumentary (Hair, Skin) Wound #1 status is Open. Original cause of wound was Surgical Injury. The date acquired was: 09/23/2022. The wound is located on the Left,Medial Ankle. The wound measures 2cm length x 1.6cm width x 0.2cm depth; 2.513cm^2 area and 0.503cm^3 volume. There is Fat Layer (Subcutaneous Tissue) exposed. There is a medium amount of serosanguineous drainage noted. There is no  granulation within the wound bed. There is a large (67-100%) amount of necrotic tissue within the wound bed including Adherent Slough. Wound #2 status is Open. Original cause of wound was Skin Tear/Laceration. The date acquired was: 10/14/2022. The wound is located on the Right,Lateral Lower Leg. The wound measures 5.5cm length x 5.5cm width x 0.1cm depth; 23.758cm^2 area and 2.376cm^3 volume. There is a medium amount of sanguinous drainage noted. There is medium (34-66%) red granulation within the wound bed. There is no necrotic tissue within the wound bed. Assessment Active Problems ICD-10 Disruption of external operation (surgical) wound, not elsewhere classified, initial encounter Unspecified open wound, left lower leg, initial encounter Basal cell carcinoma of skin, unspecified Unspecified open wound, right lower leg, initial encounter Chronic atrial fibrillation, unspecified Long term (current) use of anticoagulants Patient presents with a 2-week history of nonhealing wound to the left lower extremity status post basal cell carcinoma removal with Mohs surgery. I am concerned for infection and will start patient on doxycycline. At this time I recommended Medihoney and Hydrofera Blue with compression stockings daily. Unfortunately she developed a skin tear to her right lower extremity after a fall. At this time I recommended Xeroform and compression stocking daily. Plan Follow-up Appointments: Return Appointment in 1 week. Bathing/ Shower/ Hygiene: Wash wounds with antibacterial soap and water. Anesthetic (Use 'Patient Medications' Section for Anesthetic Order Entry): Lidocaine applied to wound bed Edema Control - Lymphedema / Segmental Compressive Device /  Other: Patient to wear own compression stockings. Remove compression stockings every night before going to bed and put on every morning when getting up. Elevate, Exercise Daily and Avoid Standing for Long Periods of Time. Elevate  legs to the level of the heart and pump ankles as often as possible WOUND #1: - Ankle Wound Laterality: Left, Medial Cleanser: Byram Ancillary Kit - 15 Day Supply (DME) (Generic) 1 x Per Day/30 Days Discharge Instructions: Use supplies as instructed; Kit contains: (15) Saline Bullets; (15) 3x3 Gauze; 15 pr Gloves Cleanser: Normal Saline 1 x Per Day/30 Days Discharge Instructions: Wash your hands with soap and water. Remove old dressing, discard into plastic bag and place into trash. Cleanse the wound with Normal Saline prior to applying a clean dressing using gauze sponges, not tissues or cotton balls. Do not scrub or use excessive force. Pat dry using gauze sponges, not tissue or cotton balls. Cleanser: Soap and Water 1 x Per Day/30 Days Discharge Instructions: Gently cleanse wound with antibacterial soap, rinse and pat dry prior to dressing wounds Topical: Activon Honey Gel, 25 (g) Tube 1 x Per Day/30 Days Prim Dressing: Hydrofera Blue Ready Transfer Foam, 2.5x2.5 (in/in) (DME) (Dispense As Written) 1 x Per Day/30 Days [name], [name] ([id number]) [id number] Page 8 of 10 Discharge Instructions: Apply Hydrofera Blue Ready to wound bed as directed Secondary Dressing: ABD Pad 5x9 (in/in) (DME) (Generic) 1 x Per Day/30 Days Discharge Instructions: Cover with ABD pad Secured With: Kerlix Roll Sterile or Non-Sterile 6-ply 4.5x4 (yd/yd) (DME) (Generic) 1 x Per Day/30 Days Discharge Instructions: Apply Kerlix as directed WOUND #2: - Lower Leg Wound Laterality: Right, Lateral Cleanser: Normal Saline 1 x Per Day/30 Days Discharge Instructions: Wash your hands with soap and water. Remove old dressing, discard into plastic bag and place into trash. Cleanse the wound with Normal Saline prior to applying a clean dressing using gauze sponges, not tissues or cotton balls. Do not scrub or use excessive force. Pat dry using gauze sponges, not tissue or cotton  balls. Cleanser: Soap and Water 1 x Per Day/30 Days Discharge Instructions: Gently cleanse wound with antibacterial soap, rinse and pat dry prior to dressing wounds Prim Dressing: Xeroform 5x9-HBD (in/in) (DME) (Generic) 1 x Per Day/30 Days ary Discharge Instructions: Apply Xeroform 5x9-HBD (in/in) as directed Secondary Dressing: ABD Pad 5x9 (in/in) 1 x Per Day/30 Days Discharge Instructions: Cover with ABD pad Secondary Dressing: Kerlix 4.5 x 4.1 (in/yd) 1 x Per Day/30 Days Discharge Instructions: Apply Kerlix 4.5 x 4.1 (in/yd) as instructed Secured With: Medipore T - 76M Medipore H Soft Cloth Surgical T ape ape, 2x2 (in/yd) 1 x Per Day/30 Days 1. Medihoney and Hydrofera Blue 2. Doxycycline 3. Xeroform 4. Compression stockings daily 5. Follow-up in 1 week Electronic Signature(s) Signed: 10/18/2022 12:14:48 PM By: Joyce Shan DO Entered By: [name] on 10/18/2022 12:13:46 -------------------------------------------------------------------------------- ROS/PFSH Details Patient Name: Date of Service: NO RMA [name] 10/18/2022 10:30 A M Medical Record Number: [id number] Patient Account Number: [id number] Date of Birth/Sex: Treating RN: 24-Jan-1951 (72 y.o. [name] Primary Care Provider: Crist Kaufman Other Clinician: Referring Provider: Treating Provider/Extender: [name] in Treatment: 0 Information Obtained From Patient Eyes Complaints and Symptoms: Negative for: Dry Eyes; Vision Changes; Glasses / Contacts Ear/Nose/Mouth/Throat Complaints and Symptoms: Negative for: Difficult clearing ears; Sinusitis Hematologic/Lymphatic Complaints and Symptoms: Negative for: Bleeding / Clotting Disorders; Human Immunodeficiency Virus Respiratory Complaints and Symptoms: Negative for: Chronic or frequent coughs; Shortness of Breath [name],  [name] ([id number]) [id number] Page 9 of  10 Cardiovascular Complaints and Symptoms: Positive for: LE edema Medical History: Positive for: Hypertension Past Medical History Notes: AFIB Gastrointestinal Complaints and Symptoms: Negative for: Frequent diarrhea; Nausea; Vomiting Endocrine Complaints and Symptoms: Negative for: Hepatitis; Thyroid disease; Polydypsia (Excessive Thirst) Medical History: Negative for: Type I Diabetes; Type II Diabetes Integumentary (Skin) Complaints and Symptoms: Positive for: Wounds Immunological Medical History: Past Medical History Notes: WEGERS (GPA) Neurologic Medical History: Positive for: Neuropathy Immunizations Pneumococcal Vaccine: Received Pneumococcal Vaccination: Yes Received Pneumococcal Vaccination On or After 60th Birthday: Yes Implantable Devices None Family and Social History Never smoker; Marital Status - Married; Alcohol Use: Never; Drug Use: No History; Caffeine Use: Moderate - COFFEE Electronic Signature(s) Signed: 10/18/2022 12:14:48 PM By: Joyce Shan DO Signed: 10/18/2022 5:10:28 PM By: Joyce Loud MSN RN CNS WTA Entered By: [name] on 10/18/2022 10:56:23 -------------------------------------------------------------------------------- SuperBill Details Patient Name: Date of Service: NO RMA [name] 10/18/2022 Medical Record Number: [id number] Patient Account Number: [id number] Date of Birth/Sex: Treating RN: [birth year] (72 y.o. [name] Primary Care Provider: Crist Kaufman Other Clinician: Referring Provider: Treating Provider/Extender: [name] in Treatment: 0 [name], ANTALEK Scurry ([id number]) [id number] Page 10 of 10 Diagnosis Coding ICD-10 Codes Code Description T81.31XA Disruption of external operation (surgical) wound, not elsewhere classified, initial encounter I48.20 Chronic atrial fibrillation, unspecified Z79.01 Long term (current) use of anticoagulants S81.802A  Unspecified open wound, left lower leg, initial encounter S81.801A Unspecified open wound, right lower leg, initial encounter C44.91 Basal cell carcinoma of skin, unspecified Facility Procedures : CPT4 Code: [id number] Description: [place] VISIT-LEV 5 EST PT Modifier: Quantity: 1 : CPT4 Code: [id number] Description: [id number] - DEB SUBQ TISSUE 20 SQ CM/< ICD-10 Diagnosis Description S81.802A Unspecified open wound, left lower leg, initial encounter Modifier: Quantity: 1 : CPT4 Code: [id number] Description: [id number] - DEBRIDE WOUND EA ADDL 20 SQ CM ICD-10 Diagnosis Description S81.801A Unspecified open wound, right lower leg, initial encounter Modifier: Quantity: 1 Physician Procedures : CPT4 Code Description Modifier [id number] - WC PHYS LEVEL 4 - EST PT ICD-10 Diagnosis Description T81.31XA Disruption of external operation (surgical) wound, not elsewhere classified, initial encounter S81.802A Unspecified open wound, left lower  leg, initial encounter S81.801A Unspecified open wound, right lower leg, initial encounter C44.91 Basal cell carcinoma of skin, unspecified Quantity: 1 : [id number] - WC PHYS SUBQ TISS 20 SQ CM ICD-10 Diagnosis Description S81.802A Unspecified open wound, left lower leg, initial encounter Quantity: 1 : [id number] - WC PHYS DEBR WO ANESTH EA ADD 20 CM ICD-10 Diagnosis Description S81.801A Unspecified open wound, right lower leg, initial encounter Quantity: 1 Electronic Signature(s) Signed: 10/20/2022 1:56:17 PM By: Joyce Loud MSN RN CNS WTA Signed: 10/25/2022 2:55:06 PM By: Joyce Shan DO Previous Signature: 10/18/2022 12:14:48 PM Version By: Joyce Shan DO Entered By: [name] on 10/20/2022 13:56:17

## 2022-10-19 NOTE — Progress Notes (Addendum)
[name] [place] ([id number]) [id number] Page 1 of 12 Visit Report for 10/18/2022 Allergy List Details Patient Name: Date of Service: NO RMA [name] 10/18/2022 10:30 A M Medical Record Number: [id number] Patient Account Number: [id number] Date of Birth/Sex: Treating RN: 1951/08/17 (72 y.o. [name] Primary Care [name]: [name] Other Clinician: Referring [name]: Treating [name]/Extender: [name], [name] in Treatment: 0 Allergies Active Allergies Lyrica Reaction: swelling hands and feet Severity: Moderate codeine Reaction: itching Allergy Notes Electronic Signature(s) Signed: 10/18/2022 5:10:28 PM By: Joyce Loud MSN RN CNS WTA Entered By: [name] on 10/18/2022 11:20:25 -------------------------------------------------------------------------------- Arrival Information Details Patient Name: Date of Service: NO RMA [name] 10/18/2022 10:30 A M Medical Record Number: [id number] Patient Account Number: [id number] Date of Birth/Sex: Treating RN: [birth year] (72 y.o. [name] Primary Care [name]: [name] Other Clinician: Referring [name]: Treating [name]/Extender: [name] NA THA N Kaufman in Treatment: 0 Visit Information Patient Arrived: Ambulatory Arrival Time: 10:30 Accompanied By: self Transfer Assistance: None Patient Identification Verified: Yes Secondary Verification Process Completed: Yes Patient Requires Transmission-Based Precautions: No Patient Has Alerts: Yes Patient Alerts: Patient on Blood Thinner [name]//ASA [name] ([id number]) [id number] Page 2 of 12 Not Diabetic Electronic Signature(s) Signed: 10/18/2022 5:10:28 PM By: Joyce Loud MSN RN CNS WTA Entered By: [name] on 10/18/2022 11:34:42 -------------------------------------------------------------------------------- Clinic Level of  Care Assessment Details Patient Name: Date of Service: NO RMA [name] 10/18/2022 10:30 A M Medical Record Number: [id number] Patient Account Number: [id number] Date of Birth/Sex: Treating RN: 1951/08/13 (72 y.o. [name] Primary Care Jessup Ogas: [name] Other Clinician: Referring [name]: Treating [name]/Extender: [name], [name] in Treatment: 0 Clinic Level of Care Assessment Items TOOL 2 Quantity Score X- 1 0 Use when only an EandM is performed on the INITIAL visit ASSESSMENTS - Nursing Assessment / Reassessment X- 1 20 General Physical Exam (combine w/ comprehensive assessment (listed just below) when performed on new pt. evals) X- 1 25 Comprehensive Assessment (HX, ROS, Risk Assessments, Wounds Hx, etc.) ASSESSMENTS - Wound and Skin A ssessment / Reassessment '[]'$  - 0 Simple Wound Assessment / Reassessment - one wound X- 2 5 Complex Wound Assessment / Reassessment - multiple wounds '[]'$  - 0 Dermatologic / Skin Assessment (not related to wound area) ASSESSMENTS - Ostomy and/or Continence Assessment and Care '[]'$  - 0 Incontinence Assessment and Management '[]'$  - 0 Ostomy Care Assessment and Management (repouching, etc.) PROCESS - Coordination of Care '[]'$  - 0 Simple Patient / Family Education for ongoing care '[]'$  - 0 Complex (extensive) Patient / Family Education for ongoing care X- 1 10 Staff obtains Programmer, systems, Records, T Results / Process Orders est '[]'$  - 0 Staff telephones HHA, Nursing Homes / Clarify orders / etc '[]'$  - 0 Routine Transfer to another Facility (non-emergent condition) '[]'$  - 0 Routine Hospital Admission (non-emergent condition) X- 1 15 New Admissions / Biomedical engineer / Ordering NPWT Apligraf, etc. , '[]'$  - 0 Emergency Hospital Admission (emergent condition) '[]'$  - 0 Simple Discharge Coordination X- 1 15 Complex (extensive) Discharge Coordination PROCESS - Special Needs '[]'$  - 0 Pediatric / Minor Patient  Management '[]'$  - 0 Isolation Patient Management '[]'$  - 0 Hearing / Language / Visual special needs [name] ([id number]) [id number] Page 3 of 12 '[]'$  - 0 Assessment of Community assistance (transportation, D/C planning, etc.) '[]'$  - 0 Additional assistance / Altered  mentation '[]'$  - 0 Support Surface(s) Assessment (bed, cushion, seat, etc.) INTERVENTIONS - Wound Cleansing / Measurement '[]'$  - 0 Wound Imaging (photographs - any number of wounds) '[]'$  - 0 Wound Tracing (instead of photographs) '[]'$  - 0 Simple Wound Measurement - one wound X- 2 5 Complex Wound Measurement - multiple wounds '[]'$  - 0 Simple Wound Cleansing - one wound X- 2 5 Complex Wound Cleansing - multiple wounds INTERVENTIONS - Wound Dressings '[]'$  - 0 Small Wound Dressing one or multiple wounds X- 2 15 Medium Wound Dressing one or multiple wounds '[]'$  - 0 Large Wound Dressing one or multiple wounds '[]'$  - 0 Application of Medications - injection INTERVENTIONS - Miscellaneous '[]'$  - 0 External ear exam '[]'$  - 0 Specimen Collection (cultures, biopsies, blood, body fluids, etc.) '[]'$  - 0 Specimen(s) / Culture(s) sent or taken to Lab for analysis '[]'$  - 0 Patient Transfer (multiple staff / Civil Service fast streamer / Similar devices) '[]'$  - 0 Simple Staple / Suture removal (25 or less) '[]'$  - 0 Complex Staple / Suture removal (26 or more) '[]'$  - 0 Hypo / Hyperglycemic Management (close monitor of Blood Glucose) X- 1 15 Ankle / Brachial Index (ABI) - do not check if billed separately Has the patient been seen at the hospital within the last three years: Yes Total Score: 160 Level Of Care: New/Established - Level 5 Electronic Signature(s) Signed: 10/20/2022 2:53:23 PM By: Joyce Loud MSN RN CNS WTA Previous Signature: 10/18/2022 5:10:28 PM Version By: Joyce Loud MSN RN CNS WTA Entered By: [name] on 10/20/2022  13:55:11 -------------------------------------------------------------------------------- Encounter Discharge Information Details Patient Name: Date of Service: NO RMA [name] 10/18/2022 10:30 A M Medical Record Number: [id number] Patient Account Number: [id number] Date of Birth/Sex: Treating RN: Jun 13, 1951 (72 y.o. [name] Primary Care Nikeisha Klutz: [name] Other Clinician: Referring Zaide Kardell: Treating Kamrynn Melott/Extender: [name], [name] in Treatment: 0 Encounter Discharge Information Items Post Procedure Vitals Discharge Condition: Stable Temperature (F): 98.1 Ambulatory Status: Ambulatory Pulse (bpm): [address] [name] ([id number]) [id number] Page 4 of 12 Discharge Destination: Home Respiratory Rate (breaths/min): 16 Transportation: Private Auto Blood Pressure (mmHg): 117/72 Accompanied By: SELF Schedule Follow-up Appointment: Yes Clinical Summary of Care: Electronic Signature(s) Signed: 10/20/2022 1:58:32 PM By: Joyce Loud MSN RN CNS WTA Previous Signature: 10/18/2022 5:10:28 PM Version By: Joyce Loud MSN RN CNS WTA Entered By: [name] on 10/20/2022 13:58:31 -------------------------------------------------------------------------------- Lower Extremity Assessment Details Patient Name: Date of Service: NO RMA [name] 10/18/2022 10:30 A M Medical Record Number: [id number] Patient Account Number: [id number] Date of Birth/Sex: Treating RN: May 03, 1951 (72 y.o. [name] Primary Care Aditya Nastasi: [name] Other Clinician: Referring Hermie Reagor: Treating Marium Ragan/Extender: [name], [name] in Treatment: 0 Edema Assessment Assessed: [Left: No] [Right: No] Edema: [Left: No] [Right: No] Calf Left: Right: Point of Measurement: 33 cm From Medial Instep 35.2 cm 36 cm Ankle Left: Right: Point of Measurement: 12 cm From Medial Instep 22.5 cm 23 cm Knee To  Floor Left: Right: From Medial Instep 39.5 cm 39.5 cm Vascular Assessment Pulses: Dorsalis Pedis Palpable: [Left:Yes] [Right:Yes] Doppler Audible: [Left:Yes] [Right:Yes] Posterior Tibial Palpable: [Left:Yes] [Right:Yes] Doppler Audible: [Left:Yes] [Right:Yes] Popliteal Palpable: [Left:Yes] [Right:Yes] Doppler Audible: [Left:Yes] [Right:Yes] Blood Pressure: Brachial: [id number] [Right:107] Ankle: [Left:Dorsalis Pedis: 162 1.51] [Right:Dorsalis Pedis: 151 1.41] Electronic Signature(s) Signed: 10/18/2022 5:10:28 PM By: Joyce Loud MSN RN CNS WTA Entered By: [name] on 10/18/2022 11:05:46 [name], [name] (  [id number]) [id number] Page 5 of 12 -------------------------------------------------------------------------------- Multi Wound Chart Details Patient Name: Date of Service: NO RMA [name] 10/18/2022 10:30 A M Medical Record Number: [id number] Patient Account Number: [id number] Date of Birth/Sex: Treating RN: [birth year] (72 y.o. [name] Primary Care Shyne Resch: [name] Other Clinician: Referring Nalaya Wojdyla: Treating Wakeelah Solan/Extender: [name], [name] in Treatment: 0 Vital Signs Height(in): 61 Pulse(bpm): 86 Weight(lbs): 160 Blood Pressure(mmHg): 117/72 Body Mass Index(BMI): 30.2 Temperature(F): 98.1 Respiratory Rate(breaths/min): 16 [1:Photos:] [N/A:N/A] Left, Medial Ankle Right, Lateral Lower Leg N/A Wound Location: Surgical Injury Skin T ear/Laceration N/A Wounding Event: Atypical Skin T ear N/A Primary Etiology: N/A Hypertension, Neuropathy N/A Comorbid History: 09/23/2022 10/14/2022 N/A Date Acquired: 0 0 N/A Kaufman of Treatment: Open Open N/A Wound Status: No No N/A Wound Recurrence: 2x1.6x0.2 5.5x5.5x0.1 N/A Measurements L x W x D (cm) 2.513 23.758 N/A A (cm) : rea 0.503 2.376 N/A Volume (cm) : Full Thickness Without Exposed Full Thickness Without Exposed  N/A Classification: Support Structures Support Structures Medium Medium N/A Exudate Amount: Serosanguineous Sanguinous N/A Exudate Type: red, brown red N/A Exudate Color: None Present (0%) Medium (34-66%) N/A Granulation Amount: N/A Red N/A Granulation Quality: Large (67-100%) None Present (0%) N/A Necrotic Amount: Fat Layer (Subcutaneous Tissue): Yes Fascia: No N/A Exposed Structures: Fascia: No Fat Layer (Subcutaneous Tissue): No Tendon: No Tendon: No Muscle: No Muscle: No Joint: No Joint: No Bone: No Bone: No None Small (1-33%) N/A Epithelialization: Treatment Notes Wound #1 (Ankle) Wound Laterality: Left, Medial Cleanser Byram Ancillary Kit - 15 Day Supply Discharge Instruction: Use supplies as instructed; Kit contains: (15) Saline Bullets; (15) 3x3 Gauze; 15 pr Gloves Normal Saline Discharge Instruction: Wash your hands with soap and water. Remove old dressing, discard into plastic bag and place into trash. Cleanse the wound with Normal Saline prior to applying a clean dressing using gauze sponges, not tissues or cotton balls. Do not scrub or use excessive LAENA, LICK ([id number]) [id number] Page 6 of 12 force. Pat dry using gauze sponges, not tissue or cotton balls. Soap and Water Discharge Instruction: Gently cleanse wound with antibacterial soap, rinse and pat dry prior to dressing wounds Peri-Wound Care Topical Activon Honey Gel, 25 (g) Tube Primary Dressing Hydrofera Blue Ready Transfer Foam, 2.5x2.5 (in/in) Discharge Instruction: Apply Hydrofera Blue Ready to wound bed as directed Secondary Dressing ABD Pad 5x9 (in/in) Discharge Instruction: Cover with ABD pad Secured With Kerlix Roll Sterile or Non-Sterile 6-ply 4.5x4 (yd/yd) Discharge Instruction: Apply Kerlix as directed Compression Wrap Compression Stockings Add-Ons Wound #2 (Lower Leg) Wound Laterality: Right, Lateral Cleanser Normal Saline Discharge  Instruction: Wash your hands with soap and water. Remove old dressing, discard into plastic bag and place into trash. Cleanse the wound with Normal Saline prior to applying a clean dressing using gauze sponges, not tissues or cotton balls. Do not scrub or use excessive force. Pat dry using gauze sponges, not tissue or cotton balls. Soap and Water Discharge Instruction: Gently cleanse wound with antibacterial soap, rinse and pat dry prior to dressing wounds Peri-Wound Care Topical Primary Dressing Xeroform 5x9-HBD (in/in) Discharge Instruction: Apply Xeroform 5x9-HBD (in/in) as directed Secondary Dressing ABD Pad 5x9 (in/in) Discharge Instruction: Cover with ABD pad Kerlix 4.5 x 4.1 (in/yd) Discharge Instruction: Apply Kerlix 4.5 x 4.1 (in/yd) as instructed Secured With [place] H Soft Cloth Surgical T ape ape, 2x2 (in/yd) Compression Wrap Compression Stockings Add-Ons Electronic Signature(s) Signed: 10/18/2022 12:14:48 PM By: Kalman Shan  DO Entered By: Kalman Shan on 10/18/2022 11:58:21 [name] ([id number]) [id number] Page 7 of 12 -------------------------------------------------------------------------------- Multi-Disciplinary Care Plan Details Patient Name: Date of Service: NO RMA [name] 10/18/2022 10:30 A M Medical Record Number: [id number] Patient Account Number: [id number] Date of Birth/Sex: Treating RN: 1950/09/16 (72 y.o. [name] Primary Care Brynda Heick: [name] Other Clinician: Referring Giah Fickett: Treating Alexsandra Shontz/Extender: [name], [name] in Treatment: 0 Active Inactive Necrotic Tissue Nursing Diagnoses: Impaired tissue integrity related to necrotic/devitalized tissue Knowledge deficit related to management of necrotic/devitalized tissue Goals: Necrotic/devitalized tissue will be minimized in the wound bed Date Initiated: 10/20/2022 Target Resolution Date:  11/20/2022 Goal Status: Active Patient/caregiver will verbalize understanding of reason and process for debridement of necrotic tissue Date Initiated: 10/20/2022 Target Resolution Date: 11/20/2022 Goal Status: Active Interventions: Assess patient pain level pre-, during and post procedure and prior to discharge Provide education on necrotic tissue and debridement process Treatment Activities: Apply topical anesthetic as ordered : 10/18/2022 Notes: Orientation to the Wound Care Program Nursing Diagnoses: Knowledge deficit related to the wound healing center program Goals: Patient/caregiver will verbalize understanding of the [place] Date Initiated: 10/20/2022 Target Resolution Date: 11/17/2022 Goal Status: Active Interventions: Provide education on orientation to the wound center Notes: Electronic Signature(s) Signed: 10/20/2022 1:57:22 PM By: Joyce Loud MSN RN CNS WTA Entered By: [name] on 10/20/2022 13:57:22 [name] ([id number]) [id number] Page 8 of 12 -------------------------------------------------------------------------------- Pain Assessment Details Patient Name: Date of Service: NO RMA [name] 10/18/2022 10:30 A M Medical Record Number: [id number] Patient Account Number: [id number] Date of Birth/Sex: Treating RN: 1951-06-10 (72 y.o. [name] Primary Care Ladaisha Portillo: [name] Other Clinician: Referring Arrion Broaddus: Treating Adrean Heitz/Extender: [name], [name] in Treatment: 0 Active Problems Location of Pain Severity and Description of Pain Patient Has Paino No Site Locations With Dressing Change: Yes Duration of the Pain. Constant / Intermittento Intermittent How Long Does it Lasto Hours: Minutes: 1 Rate the pain. Current Pain Level: 6 Worst Pain Level: 9 Least Pain Level: 1 Tolerable Pain Level: 3 Character of Pain Describe the Pain: Sharp, Shooting Pain  Management and Medication Current Pain Management: Medication: Yes Electronic Signature(s) Signed: 10/18/2022 5:10:28 PM By: Joyce Loud MSN RN CNS WTA Entered By: [name] on 10/18/2022 10:31:39 -------------------------------------------------------------------------------- Patient/Caregiver Education Details Patient Name: Date of Service: NO RMA [name] 2/28/2024andnbsp10:30 A M Medical Record Number: [id number] Patient Account Number: [id number] Date of Birth/Gender: Treating RN: [birth year] (72 y.o. [name] Primary Care Physician: [name] Other Clinician: SAFIYYAH, BUEHRLE ([id number]) [id number] Page 9 of 12 Referring Physician: Treating Physician/Extender: [name] NA THA N Kaufman in Treatment: 0 Education Assessment Education Provided To: Patient Education Topics Provided Wound/Skin Impairment: Handouts: Caring for Your Ulcer Methods: Explain/Verbal Responses: State content correctly Electronic Signature(s) Signed: 10/20/2022 2:53:23 PM By: Joyce Loud MSN RN CNS WTA Previous Signature: 10/18/2022 5:10:28 PM Version By: Joyce Loud MSN RN CNS WTA Entered By: [name] on 10/20/2022 13:57:28 -------------------------------------------------------------------------------- Wound Assessment Details Patient Name: Date of Service: NO RMA [name] 10/18/2022 10:30 A M Medical Record Number: [id number] Patient Account Number: [id number] Date of Birth/Sex: Treating RN: 02-26-1951 (71 y.o. [name] Primary Care [name]: [name] Other Clinician: Referring Milford Cilento: Treating Cleta Heatley/Extender: [name], [name] in Treatment: 0 Wound Status Wound Number:  1 Primary Etiology: Atypical Wound Location: Left, Medial Ankle Wound Status: Open Wounding Event: Surgical Injury Date Acquired: 09/23/2022 Kaufman Of Treatment: 0 Clustered Wound: No Photos Wound  Measurements Length: (cm) 2 Width: (cm) 1.6 Depth: (cm) 0.2 Area: (cm) 2.513 Volume: (cm) 0.503 % Reduction in Area: % Reduction in Volume: Epithelialization: None Wound Description SHEELAH, SANTULLI ([id number]) Classification: Full Thickness Without Exposed Support Structures Exudate Amount: Medium Exudate Type: Serosanguineous Exudate Color: red, brown [id number] Page 10 of 12 Foul Odor After Cleansing: No Slough/Fibrino Yes Wound Bed Granulation Amount: None Present (0%) Exposed Structure Necrotic Amount: Large (67-100%) Fascia Exposed: No Necrotic Quality: Adherent Slough Fat Layer (Subcutaneous Tissue) Exposed: Yes Tendon Exposed: No Muscle Exposed: No Joint Exposed: No Bone Exposed: No Treatment Notes Wound #1 (Ankle) Wound Laterality: Left, Medial Cleanser Byram Ancillary Kit - 15 Day Supply Discharge Instruction: Use supplies as instructed; Kit contains: (15) Saline Bullets; (15) 3x3 Gauze; 15 pr Gloves Normal Saline Discharge Instruction: Wash your hands with soap and water. Remove old dressing, discard into plastic bag and place into trash. Cleanse the wound with Normal Saline prior to applying a clean dressing using gauze sponges, not tissues or cotton balls. Do not scrub or use excessive force. Pat dry using gauze sponges, not tissue or cotton balls. Soap and Water Discharge Instruction: Gently cleanse wound with antibacterial soap, rinse and pat dry prior to dressing wounds Peri-Wound Care Topical Activon Honey Gel, 25 (g) Tube Primary Dressing Hydrofera Blue Ready Transfer Foam, 2.5x2.5 (in/in) Discharge Instruction: Apply Hydrofera Blue Ready to wound bed as directed Secondary Dressing ABD Pad 5x9 (in/in) Discharge Instruction: Cover with ABD pad Secured With Kerlix Roll Sterile or Non-Sterile 6-ply 4.5x4 (yd/yd) Discharge Instruction: Apply Kerlix as directed Compression Wrap Compression Stockings Add-Ons Electronic  Signature(s) Signed: 10/18/2022 5:10:28 PM By: Joyce Loud MSN RN CNS WTA Entered By: [name] on 10/18/2022 10:39:40 -------------------------------------------------------------------------------- Wound Assessment Details Patient Name: Date of Service: NO RMA [name] 10/18/2022 10:30 A M Medical Record Number: [id number] Patient Account Number: [id number] Date of Birth/Sex: Treating RN: 1951-06-24 (72 y.o. [name] Primary Care Zendaya Groseclose: [name] Other Clinician: Referring Roddy Bellamy: Treating Darshana Curnutt/Extender: [name] NA [place], Joyce [place] ([id number]) [id number] Page 11 of 12 Kaufman in Treatment: 0 Wound Status Wound Number: 2 Primary Etiology: Skin Tear Wound Location: Right, Lateral Lower Leg Wound Status: Open Wounding Event: Skin Tear/Laceration Comorbid History: Hypertension, Neuropathy Date Acquired: 10/14/2022 Kaufman Of Treatment: 0 Clustered Wound: No Photos Wound Measurements Length: (cm) 5.5 Width: (cm) 5.5 Depth: (cm) 0.1 Area: (cm) 23.758 Volume: (cm) 2.376 % Reduction in Area: % Reduction in Volume: Epithelialization: Small (1-33%) Wound Description Classification: Full Thickness Without Exposed Support Structures Exudate Amount: Medium Exudate Type: Sanguinous Exudate Color: red Foul Odor After Cleansing: No Slough/Fibrino No Wound Bed Granulation Amount: Medium (34-66%) Exposed Structure Granulation Quality: Red Fascia Exposed: No Necrotic Amount: None Present (0%) Fat Layer (Subcutaneous Tissue) Exposed: No Tendon Exposed: No Muscle Exposed: No Joint Exposed: No Bone Exposed: No Treatment Notes Wound #2 (Lower Leg) Wound Laterality: Right, Lateral Cleanser Normal Saline Discharge Instruction: Wash your hands with soap and water. Remove old dressing, discard into plastic bag and place into trash. Cleanse the wound with Normal Saline prior to applying a clean dressing  using gauze sponges, not tissues or cotton balls. Do not scrub or use excessive force. Pat dry using gauze sponges, not tissue or cotton balls. Soap and Water Discharge Instruction: Gently cleanse wound  with antibacterial soap, rinse and pat dry prior to dressing wounds Peri-Wound Care Topical Primary Dressing Xeroform 5x9-HBD (in/in) Discharge Instruction: Apply Xeroform 5x9-HBD (in/in) as directed Secondary Dressing ABD Pad 5x9 (in/in) Discharge Instruction: Cover with ABD pad Kerlix 4.5 x 4.1 (in/yd) Discharge Instruction: Apply Kerlix 4.5 x 4.1 (in/yd) as instructed Secured With DARTHA, DENNER ([id number]) [id number] Page 12 of 12 Medipore T - 54M Medipore H Soft Cloth Surgical T ape ape, 2x2 (in/yd) Compression Wrap Compression Stockings Add-Ons Electronic Signature(s) Signed: 10/18/2022 5:10:28 PM By: Joyce Loud MSN RN CNS WTA Entered By: [name] on 10/18/2022 11:04:27 -------------------------------------------------------------------------------- Vitals Details Patient Name: Date of Service: NO RMA [name] 10/18/2022 10:30 A M Medical Record Number: [id number] Patient Account Number: [id number] Date of Birth/Sex: Treating RN: 1951-06-15 (72 y.o. [name] Primary Care Lolitha Tortora: [name] Other Clinician: Referring Irasema Chalk: Treating Deneice Wack/Extender: [name], [name] in Treatment: 0 Vital Signs Time Taken: 10:31 Temperature (F): 98.1 Height (in): 61 Pulse (bpm): 86 Source: Stated Respiratory Rate (breaths/min): 16 Weight (lbs): 160 Blood Pressure (mmHg): 117/72 Source: Stated Reference Range: 80 - 120 mg / dl Body Mass Index (BMI): 30.2 Electronic Signature(s) Signed: 10/18/2022 5:10:28 PM By: Joyce Loud MSN RN CNS WTA Entered By: [name] on 10/18/2022 10:32:15

## 2022-10-25 ENCOUNTER — Ambulatory Visit: Payer: Medicare Other | Admitting: Internal Medicine

## 2022-10-25 ENCOUNTER — Encounter: Payer: Medicare Other | Attending: Internal Medicine | Admitting: Internal Medicine

## 2022-10-25 DIAGNOSIS — L97822 Non-pressure chronic ulcer of other part of left lower leg with fat layer exposed: Secondary | ICD-10-CM | POA: Insufficient documentation

## 2022-10-25 DIAGNOSIS — I872 Venous insufficiency (chronic) (peripheral): Secondary | ICD-10-CM | POA: Insufficient documentation

## 2022-10-25 DIAGNOSIS — T8131XA Disruption of external operation (surgical) wound, not elsewhere classified, initial encounter: Secondary | ICD-10-CM

## 2022-10-25 DIAGNOSIS — S81801A Unspecified open wound, right lower leg, initial encounter: Secondary | ICD-10-CM

## 2022-10-25 DIAGNOSIS — M313 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: Secondary | ICD-10-CM | POA: Diagnosis not present

## 2022-10-25 DIAGNOSIS — I10 Essential (primary) hypertension: Secondary | ICD-10-CM | POA: Diagnosis not present

## 2022-10-25 DIAGNOSIS — G8929 Other chronic pain: Secondary | ICD-10-CM | POA: Diagnosis not present

## 2022-10-25 DIAGNOSIS — I482 Chronic atrial fibrillation, unspecified: Secondary | ICD-10-CM | POA: Diagnosis not present

## 2022-10-25 DIAGNOSIS — S81802A Unspecified open wound, left lower leg, initial encounter: Secondary | ICD-10-CM

## 2022-10-25 DIAGNOSIS — Z7901 Long term (current) use of anticoagulants: Secondary | ICD-10-CM | POA: Diagnosis not present

## 2022-10-25 DIAGNOSIS — L97322 Non-pressure chronic ulcer of left ankle with fat layer exposed: Secondary | ICD-10-CM | POA: Diagnosis not present

## 2022-10-27 NOTE — Progress Notes (Signed)
KEVAN, DENISTON [place] ([id number]) [id number] Page 1 of 10 Visit Report for 10/25/2022 Arrival Information Details Patient Name: Date of Service: NO RMA [name] 10/25/2022 1:15 PM Medical Record Number: [id number] Patient Account Number: [id number] Date of Birth/Sex: Treating RN: Nov 13, 1950 (72 y.o. [name] Primary Care Ogechi Kuehnel: Crist Infante Other Clinician: Referring Armany Mano: Treating Mclane Arora/Extender: Deatra James in Treatment: 1 Visit Information History Since Last Visit Added or deleted any medications: No Patient Arrived: Ambulatory Has Dressing in Place as Prescribed: Yes Arrival Time: 13:38 Pain Present Now: No Accompanied By: self Transfer Assistance: None Patient Requires Transmission-Based Precautions: No Patient Has Alerts: Yes Patient Alerts: Patient on Blood Thinner Eliquis//ASA Not Diabetic Electronic Signature(s) Signed: 10/25/2022 4:31:28 PM By: Rosalio Loud MSN RN CNS WTA Entered By: Rosalio Loud on 10/25/2022 16:31:27 -------------------------------------------------------------------------------- Clinic Level of Care Assessment Details Patient Name: Date of Service: NO RMA [name] 10/25/2022 1:15 PM Medical Record Number: [id number] Patient Account Number: [id number] Date of Birth/Sex: Treating RN: 1950/10/04 (72 y.o. [name] Primary Care Adleigh Mcmasters: Crist Infante Other Clinician: Referring Remona Boom: Treating Hallie Ishida/Extender: Deatra James in Treatment: 1 Clinic Level of Care Assessment Items TOOL 1 Quantity Score '[]'$  - 0 Use when EandM and Procedure is performed on INITIAL visit ASSESSMENTS - Nursing Assessment / Reassessment '[]'$  - 0 General Physical Exam (combine w/ comprehensive assessment (listed just below) when performed on new pt. evals) '[]'$  - 0 Comprehensive Assessment (HX, ROS, Risk Assessments, Wounds Hx, etc.) ASSESSMENTS - Wound and Skin  Assessment / Reassessment '[]'$  - 0 Dermatologic / Skin Assessment (not related to wound area) ASSESSMENTS - Ostomy and/or Continence Assessment and Care JEANNEAN, PORTEOUS ([id number]) [id number] Page 2 of 10 '[]'$  - 0 Incontinence Assessment and Management '[]'$  - 0 Ostomy Care Assessment and Management (repouching, etc.) PROCESS - Coordination of Care '[]'$  - 0 Simple Patient / Family Education for ongoing care '[]'$  - 0 Complex (extensive) Patient / Family Education for ongoing care '[]'$  - 0 Staff obtains Programmer, systems, Records, T Results / Process Orders est '[]'$  - 0 Staff telephones HHA, Nursing Homes / Clarify orders / etc '[]'$  - 0 Routine Transfer to another Facility (non-emergent condition) '[]'$  - 0 Routine Hospital Admission (non-emergent condition) '[]'$  - 0 New Admissions / Biomedical engineer / Ordering NPWT Apligraf, etc. , '[]'$  - 0 Emergency Hospital Admission (emergent condition) PROCESS - Special Needs '[]'$  - 0 Pediatric / Minor Patient Management '[]'$  - 0 Isolation Patient Management '[]'$  - 0 Hearing / Language / Visual special needs '[]'$  - 0 Assessment of Community assistance (transportation, D/C planning, etc.) '[]'$  - 0 Additional assistance / Altered mentation '[]'$  - 0 Support Surface(s) Assessment (bed, cushion, seat, etc.) INTERVENTIONS - Miscellaneous '[]'$  - 0 External ear exam '[]'$  - 0 Patient Transfer (multiple staff / Civil Service fast streamer / Similar devices) '[]'$  - 0 Simple Staple / Suture removal (25 or less) '[]'$  - 0 Complex Staple / Suture removal (26 or more) '[]'$  - 0 Hypo/Hyperglycemic Management (do not check if billed separately) '[]'$  - 0 Ankle / Brachial Index (ABI) - do not check if billed separately Has the patient been seen at the hospital within the last three years: Yes Total Score: 0 Level Of Care: ____ Electronic Signature(s) Signed: 10/25/2022 4:47:39 PM By: Rosalio Loud MSN RN CNS WTA Entered By: Rosalio Loud on 10/25/2022  16:43:45 -------------------------------------------------------------------------------- Compression Therapy Details Patient Name: Date of Service: NO RMA [name] 10/25/2022 1:15 PM Medical Record Number: [id number]  Patient Account Number: [id number] Date of Birth/Sex: Treating RN: 1951/04/24 (72 y.o. [name] Primary Care Ioane Bhola: Crist Infante Other Clinician: Referring Seleen Walter: Treating Lido Maske/Extender: Deatra James in Treatment: 1 Compression Therapy Performed for Wound Assessment: Wound #1 Left,Medial Ankle Performed By: Leeanne Deed, RN Compression Type: Double Alvera Novel, Erven Colla ([id number]) [id number] Page 3 of 10 Post Procedure Diagnosis Same as Pre-procedure Electronic Signature(s) Signed: 10/25/2022 4:32:24 PM By: Rosalio Loud MSN RN CNS WTA Entered By: Rosalio Loud on 10/25/2022 16:32:24 -------------------------------------------------------------------------------- Encounter Discharge Information Details Patient Name: Date of Service: [address] [name] 10/25/2022 1:15 PM Medical Record Number: [id number] Patient Account Number: [id number] Date of Birth/Sex: Treating RN: November 08, 1950 (72 y.o. [name] Primary Care Deadrick Stidd: Crist Infante Other Clinician: Referring Marnae Madani: Treating Keira Bohlin/Extender: Deatra James in Treatment: 1 Encounter Discharge Information Items Post Procedure Vitals Discharge Condition: Stable Temperature (F): 98.3 Ambulatory Status: Ambulatory Pulse (bpm): 96 Discharge Destination: Home Respiratory Rate (breaths/min): 16 Transportation: Private Auto Blood Pressure (mmHg): 124/78 Accompanied By: self Schedule Follow-up Appointment: Yes Clinical Summary of Care: Electronic Signature(s) Signed: 10/25/2022 4:46:02 PM By: Rosalio Loud MSN RN CNS WTA Entered By: Rosalio Loud on 10/25/2022  16:46:02 -------------------------------------------------------------------------------- Lower Extremity Assessment Details Patient Name: Date of Service: NO RMA [name] 10/25/2022 1:15 PM Medical Record Number: [id number] Patient Account Number: [id number] Date of Birth/Sex: Treating RN: 05-28-1951 (72 y.o. [name] Primary Care Stehanie Ekstrom: Crist Infante Other Clinician: Referring Rickardo Brinegar: Treating Meghen Akopyan/Extender: Deatra James in Treatment: 1 Edema Assessment Assessed: [Left: Yes] Patrice Paradise: Yes] [Left: Edema] [Right: :] Calf Left: RightLOREAL, VALENTE ([id number]) [id number] Page 4 of 10 Point of Measurement: 33 cm From Medial Instep 35.2 cm 36 cm Ankle Left: Right: Point of Measurement: 12 cm From Medial Instep 22.5 cm 23 cm Vascular Assessment Pulses: Dorsalis Pedis Palpable: [Left:Yes] [Right:Yes] Electronic Signature(s) Signed: 10/25/2022 4:31:45 PM By: Rosalio Loud MSN RN CNS WTA Entered By: Rosalio Loud on 10/25/2022 16:31:45 -------------------------------------------------------------------------------- Multi Wound Chart Details Patient Name: Date of Service: NO RMA [name] 10/25/2022 1:15 PM Medical Record Number: [id number] Patient Account Number: [id number] Date of Birth/Sex: Treating RN: 04-12-1951 (72 y.o. [name] Primary Care Xaiver Roskelley: Crist Infante Other Clinician: Referring Rathana Viveros: Treating Danaysia Rader/Extender: Deatra James in Treatment: 1 Vital Signs Height(in): 61 Pulse(bpm): 96 Weight(lbs): 160 Blood Pressure(mmHg): 124/78 Body Mass Index(BMI): 30.2 Temperature(F): 98.3 Respiratory Rate(breaths/min): 16 [1:Photos:] [N/A:N/A] Left, Medial Ankle Right, Lateral Lower Leg N/A Wound Location: Surgical Injury Skin Tear/Laceration N/A Wounding Event: Atypical Skin Tear N/A Primary Etiology: Hypertension, Neuropathy Hypertension, Neuropathy  N/A Comorbid History: 09/23/2022 10/14/2022 N/A Date Acquired: 1 1 N/A Weeks of Treatment: Open Open N/A Wound Status: No No N/A Wound Recurrence: No Yes N/A Clustered Wound: 1.3x1.5x0.2 1.5x8x0.1 N/A Measurements L x W x D (cm) 1.532 9.425 N/A A (cm) : rea 0.306 0.942 N/A Volume (cm) : 39.00% 60.30% N/A % Reduction in A rea: 39.20% 60.40% N/A % Reduction in Volume: Full Thickness Without Exposed Full Thickness Without Exposed N/A Classification: Support Structures Support Structures Medium Medium N/A Exudate Amount: Serosanguineous Sanguinous N/A Exudate TypeAZALYA, PREE ([id number]) [id number] Page 5 of 10 red, brown red N/A Exudate Color: None Present (0%) Medium (34-66%) N/A Granulation Amount: N/A Red N/A Granulation Quality: Large (67-100%) None Present (0%) N/A Necrotic Amount: Fat Layer (Subcutaneous Tissue): Yes Fascia: No N/A Exposed Structures: Fascia: No Fat Layer (Subcutaneous Tissue): No Tendon: No Tendon:  No Muscle: No Muscle: No Joint: No Joint: No Bone: No Bone: No None Small (1-33%) N/A Epithelialization: Treatment Notes Electronic Signature(s) Signed: 10/25/2022 4:31:52 PM By: Rosalio Loud MSN RN CNS WTA Entered By: Rosalio Loud on 10/25/2022 16:31:52 -------------------------------------------------------------------------------- Multi-Disciplinary Care Plan Details Patient Name: Date of Service: [address] [name] 10/25/2022 1:15 PM Medical Record Number: [id number] Patient Account Number: [id number] Date of Birth/Sex: Treating RN: [birth year] (72 y.o. [name] Primary Care Diezel Mazur: Crist Infante Other Clinician: Referring Kiyan Burmester: Treating Laqueshia Cihlar/Extender: Deatra James in Treatment: 1 Active Inactive Necrotic Tissue Nursing Diagnoses: Impaired tissue integrity related to necrotic/devitalized tissue Knowledge deficit related to management of  necrotic/devitalized tissue Goals: Necrotic/devitalized tissue will be minimized in the wound bed Date Initiated: 10/20/2022 Target Resolution Date: 11/20/2022 Goal Status: Active Patient/caregiver will verbalize understanding of reason and process for debridement of necrotic tissue Date Initiated: 10/20/2022 Target Resolution Date: 11/20/2022 Goal Status: Active Interventions: Assess patient pain level pre-, during and post procedure and prior to discharge Provide education on necrotic tissue and debridement process Treatment Activities: Apply topical anesthetic as ordered : 10/18/2022 Notes: Orientation to the Wound Care Program Nursing Diagnoses: Knowledge deficit related to the wound healing center program Goals: Patient/caregiver will verbalize understanding of the [place] Date Initiated: 10/20/2022 Target Resolution Date: 11/17/2022 Goal Status: Active JATORIA, MCGATHA ([id number]) [id number] Page 6 of 10 Interventions: Provide education on orientation to the wound center Notes: Electronic Signature(s) Signed: 10/25/2022 4:44:26 PM By: Rosalio Loud MSN RN CNS WTA Entered By: Rosalio Loud on 10/25/2022 16:44:25 -------------------------------------------------------------------------------- Pain Assessment Details Patient Name: Date of Service: [address] [name] 10/25/2022 1:15 PM Medical Record Number: [id number] Patient Account Number: [id number] Date of Birth/Sex: Treating RN: 08-29-1950 (72 y.o. [name] Primary Care Darrel Gloss: Crist Infante Other Clinician: Referring Levelle Edelen: Treating Marializ Ferrebee/Extender: Deatra James in Treatment: 1 Active Problems Location of Pain Severity and Description of Pain Patient Has Paino No Site Locations Pain Management and Medication Current Pain Management: Electronic Signature(s) Signed: 10/25/2022 4:31:35 PM By: Rosalio Loud MSN RN CNS WTA Entered By: Rosalio Loud on 10/25/2022 16:31:35 [name] ([id number]) [id number] Page 7 of 10 -------------------------------------------------------------------------------- Patient/Caregiver Education Details Patient Name: Date of Service: NO RMA Monia Pouch 3/6/2024andnbsp1:15 PM Medical Record Number: [id number] Patient Account Number: [id number] Date of Birth/Gender: Treating RN: 1951-04-03 (72 y.o. [name] Primary Care Physician: Crist Infante Other Clinician: Referring Physician: Treating Physician/Extender: Deatra James in Treatment: 1 Education Assessment Education Provided To: Patient Education Topics Provided Wound/Skin Impairment: Handouts: Caring for Your Ulcer Methods: Explain/Verbal Responses: State content correctly Electronic Signature(s) Signed: 10/25/2022 4:47:39 PM By: Rosalio Loud MSN RN CNS WTA Entered By: Rosalio Loud on 10/25/2022 16:44:41 -------------------------------------------------------------------------------- Wound Assessment Details Patient Name: Date of Service: NO RMA [name] 10/25/2022 1:15 PM Medical Record Number: [id number] Patient Account Number: [id number] Date of Birth/Sex: Treating RN: Dec 06, 1950 (72 y.o. [name] Primary Care Sem Mccaughey: Crist Infante Other Clinician: Referring Bayleigh Loflin: Treating Kassidie Hendriks/Extender: Deatra James in Treatment: 1 Wound Status Wound Number: 1 Primary Etiology: Atypical Wound Location: Left, Medial Ankle Wound Status: Open Wounding Event: Surgical Injury Comorbid History: Hypertension, Neuropathy Date Acquired: 09/23/2022 Weeks Of Treatment: 1 Clustered Wound: No Photos NAELI, GEIS [place] ([id number]) [id number] Page 8 of 10 Wound Measurements Length: (cm) 1.3 Width: (cm) 1.5 Depth: (cm) 0.2 Area: (cm) 1.532 Volume: (cm) 0.306 % Reduction in Area: 39% %  Reduction in Volume:  39.2% Epithelialization: None Wound Description Classification: Full Thickness Without Exposed Support Exudate Amount: Medium Exudate Type: Serosanguineous Exudate Color: red, brown Structures Foul Odor After Cleansing: No Slough/Fibrino Yes Wound Bed Granulation Amount: None Present (0%) Exposed Structure Necrotic Amount: Large (67-100%) Fascia Exposed: No Necrotic Quality: Adherent Slough Fat Layer (Subcutaneous Tissue) Exposed: Yes Tendon Exposed: No Muscle Exposed: No Joint Exposed: No Bone Exposed: No Treatment Notes Wound #1 (Ankle) Wound Laterality: Left, Medial Cleanser Vashe 5.8 (oz) Discharge Instruction: Use vashe 5.8 (oz) as directed Peri-Wound Care Topical Gentamicin Discharge Instruction: Apply as directed by Indi Willhite. Mupirocin Ointment Discharge Instruction: Apply as directed by Jesper Stirewalt. Primary Dressing Hydrofera Blue Ready Transfer Foam, 2.5x2.5 (in/in) Discharge Instruction: Apply Hydrofera Blue Ready to wound bed as directed Secondary Dressing ABD Pad 5x9 (in/in) Discharge Instruction: Cover with ABD pad Secured With Coban Cohesive Bandage 4x5 (yds) Stretched Discharge Instruction: Apply Coban as directed. Kerlix Roll Sterile or Non-Sterile 6-ply 4.5x4 (yd/yd) Discharge Instruction: Apply Kerlix as directed Compression Wrap Compression Stockings Add-Ons Electronic Signature(s) Signed: 10/25/2022 4:47:39 PM By: Rosalio Loud MSN RN CNS [address] [facility] [name] ([id number]) [phone].pdf Page 9 of 10 Entered By: Rosalio Loud on 10/25/2022 13:56:32 -------------------------------------------------------------------------------- Wound Assessment Details Patient Name: Date of Service: NO RMA [name] 10/25/2022 1:15 PM Medical Record Number: [id number] Patient Account Number: [id number] Date of Birth/Sex: Treating RN: October 18, 1950 (72 y.o. [name] Primary Care Karlisa Gaubert: Crist Infante Other Clinician: Referring  Krisinda Giovanni: Treating Pauline Trainer/Extender: Deatra James in Treatment: 1 Wound Status Wound Number: 2 Primary Etiology: Skin Tear Wound Location: Right, Lateral Lower Leg Wound Status: Open Wounding Event: Skin Tear/Laceration Comorbid History: Hypertension, Neuropathy Date Acquired: 10/14/2022 Weeks Of Treatment: 1 Clustered Wound: Yes Photos Wound Measurements Length: (cm) 1.5 Width: (cm) 8 Depth: (cm) 0.1 Area: (cm) 9.425 Volume: (cm) 0.942 % Reduction in Area: 60.3% % Reduction in Volume: 60.4% Epithelialization: Small (1-33%) Wound Description Classification: Full Thickness Without Exposed Support Exudate Amount: Medium Exudate Type: Sanguinous Exudate Color: red Structures Foul Odor After Cleansing: No Slough/Fibrino No Wound Bed Granulation Amount: Medium (34-66%) Exposed Structure Granulation Quality: Red Fascia Exposed: No Necrotic Amount: None Present (0%) Fat Layer (Subcutaneous Tissue) Exposed: No Tendon Exposed: No Muscle Exposed: No Joint Exposed: No Bone Exposed: No Treatment Notes Wound #2 (Lower Leg) Wound Laterality: Right, Lateral Cleanser TRELLIS, BROUSSARD JANETTE ([id number]) [id number] Page 10 of 10 [facility] Ancillary Kit - 15 Day Supply Discharge Instruction: Use supplies as instructed; Kit contains: (15) Saline Bullets; (15) 3x3 Gauze; 15 pr Gloves Soap and Water Discharge Instruction: Gently cleanse wound with antibacterial soap, rinse and pat dry prior to dressing wounds Peri-Wound Care Topical Primary Dressing Xeroform 4x4-HBD (in/in) Discharge Instruction: Apply Xeroform 4x4-HBD (in/in) as directed Secondary Dressing [place] Roll-Medium Discharge Instruction: [place] as directed Secured With [place] [facility] T ape ape, 2x2 (in/yd) Compression Wrap Compression Stockings Add-Ons Electronic Signature(s) Signed: 10/25/2022  4:47:39 PM By: Rosalio Loud MSN RN CNS WTA Entered By: Rosalio Loud on 10/25/2022 13:56:57 -------------------------------------------------------------------------------- Vitals Details Patient Name: Date of Service: NO RMA [name] 10/25/2022 1:15 PM Medical Record Number: [id number] Patient Account Number: [id number] Date of Birth/Sex: Treating RN: [birth year] (72 y.o. [name] Primary Care Lacreasha Hinds: Crist Infante Other Clinician: Referring Stiven Kaspar: Treating Zadiel Leyh/Extender: Deatra James in Treatment: 1 Vital Signs Time Taken: 13:41 Temperature (F): 98.3 Height (in): 61 Pulse (bpm): 96 Weight (lbs): 160 Respiratory  Rate (breaths/min): 16 Body Mass Index (BMI): 30.2 Blood Pressure (mmHg): 124/78 Reference Range: 80 - 120 mg / dl Electronic Signature(s) Signed: 10/25/2022 4:31:32 PM By: Rosalio Loud MSN RN CNS WTA Entered By: Rosalio Loud on 10/25/2022 16:31:31

## 2022-10-27 NOTE — Progress Notes (Signed)
[name] ([id number]) [id number] Page 1 of 7 Visit Report for 10/25/2022 Chief Complaint Document Details Patient Name: Date of Service: NO RMA [name] 10/25/2022 1:15 PM Medical Record Number: [id number] Patient Account Number: [id number] Date of Birth/Sex: Treating RN: October 28, 1950 (71 y.o. [name] Primary Care Provider: Crist Infante Other Clinician: Referring Provider: Treating Provider/Extender: Deatra James in Treatment: 1 Information Obtained from: Patient Chief Complaint 10/18/2022; left lower extremity wound status post [facility] removal with mohs surgery, right lower extremity skin tear from fall Electronic Signature(s) Signed: 10/25/2022 2:42:19 PM By: Kalman Shan DO Entered By: Kalman Shan on 10/25/2022 14:08:06 -------------------------------------------------------------------------------- Debridement Details Patient Name: Date of Service: NO RMA [name] 10/25/2022 1:15 PM Medical Record Number: [id number] Patient Account Number: [id number] Date of Birth/Sex: Treating RN: 1950-10-24 (72 y.o. [name] Primary Care Provider: Crist Infante Other Clinician: Referring Provider: Treating Provider/Extender: Deatra James in Treatment: 1 Debridement Performed for Assessment: Wound #2 Right,Lateral Lower Leg Performed By: Physician Kalman Shan, MD Debridement Type: Chemical/Enzymatic/Mechanical Agent Used: saline gauze Level of Consciousness (Pre-procedure): Awake and Alert Pre-procedure Verification/Time Out No Taken: Instrument: Other : Saline gauze Bleeding: Minimum Hemostasis Achieved: Pressure Response to Treatment: Procedure was tolerated well Level of Consciousness (Post- Awake and Alert procedure): Post Debridement Measurements of Total Wound Length: (cm) 1.5 Width: (cm) 8 Depth: (cm) 0.1 [name], [name] ([id number])  [id number] Page 2 of 7 Volume: (cm) 0.942 Character of Wound/Ulcer Post Debridement: Stable Post Procedure Diagnosis Same as Pre-procedure Electronic Signature(s) Signed: 10/25/2022 4:33:41 PM By: Rosalio Loud MSN RN CNS WTA Signed: 10/30/2022 9:11:39 AM By: Kalman Shan DO Entered By: Rosalio Loud on 10/25/2022 16:33:40 -------------------------------------------------------------------------------- HPI Details Patient Name: Date of Service: NO RMA [name] 10/25/2022 1:15 PM Medical Record Number: [id number] Patient Account Number: [id number] Date of Birth/Sex: Treating RN: 09/07/1950 (72 y.o. [name] Primary Care Provider: Crist Infante Other Clinician: Referring Provider: Treating Provider/Extender: Deatra James in Treatment: 1 History of Present Illness HPI Description: 10/18/2022 [name] is a 72 year old female with a past medical history of A-fib on Eliquis, Wegener's granulomatosis with vasculitis, and basal cell carcinoma of the left leg that presents the clinic for a wound to the left leg status post Mohs procedure for basal cell carcinoma removal and skin tear to the right lower extremity that occurred 3 days ago after a fall. She has been using Vaseline to the wound beds. She has compression stockings and has been using these. She currently has chronic pain to the left leg wound site with slight erythema and warmth to the periwound. 3/6; patient presents for follow-up. She has been using Medihoney and Hydrofera Blue to the left lower extremity wound and Xeroform to the right lower extremity wound. She denies signs of infection. She completed her oral antibiotics. Electronic Signature(s) Signed: 10/25/2022 2:42:19 PM By: Kalman Shan DO Entered By: Kalman Shan on 10/25/2022 14:08:38 -------------------------------------------------------------------------------- Physical Exam  Details Patient Name: Date of Service: NO RMA [name] 10/25/2022 1:15 PM Medical Record Number: [id number] Patient Account Number: [id number] Date of Birth/Sex: Treating RN: Mar 27, 1951 (72 y.o. [name] Primary Care Provider: Crist Infante Other Clinician: Referring Provider: Treating Provider/Extender: Deatra James in Treatment: 1 Constitutional [name], [name] [name] ([id number]) [id number] Page 3 of 7 . Cardiovascular . Psychiatric . Notes Left lower extremity: Open wound with nonviable surface throughout, Now with slight bloody granulation tissue. Right  lower extremity to the anterior aspect there is a skin tear with granulation and bruising. No signs of surrounding infection. 2+ pitting edema to the knees bilaterally. venous stasis dermatitis. Electronic Signature(s) Signed: 10/25/2022 2:42:19 PM By: Kalman Shan DO Entered By: Kalman Shan on 10/25/2022 14:09:37 -------------------------------------------------------------------------------- Physician Orders Details Patient Name: Date of Service: NO RMA [name] 10/25/2022 1:15 PM Medical Record Number: [id number] Patient Account Number: [id number] Date of Birth/Sex: Treating RN: 10-28-1950 (72 y.o. [name] Primary Care Provider: Crist Infante Other Clinician: Referring Provider: Treating Provider/Extender: Deatra James in Treatment: 1 Verbal / Phone Orders: No Diagnosis Coding ICD-10 Coding Code Description T81.31XA Disruption of external operation (surgical) wound, not elsewhere classified, initial encounter S81.802A Unspecified open wound, left lower leg, initial encounter C44.91 Basal cell carcinoma of skin, unspecified S81.801A Unspecified open wound, right lower leg, initial encounter I48.20 Chronic atrial fibrillation, unspecified Z79.01 Long term (current) use of anticoagulants Follow-up Appointments Return  Appointment in 1 week. Bathing/ Shower/ Hygiene Wound #1 Left,Medial Ankle May shower with wound dressing protected with water repellent cover or cast protector. No tub bath. Anesthetic (Use 'Patient Medications' Section for Anesthetic Order Entry) Lidocaine applied to wound bed Edema Control - Lymphedema / Segmental Compressive Device / Other Wound #1 Left,Medial Ankle Other: - Kerlix and Coban Wound Treatment Wound #1 - Ankle Wound Laterality: Left, Medial Cleanser: Vashe 5.8 (oz) 1 x Per Week/30 Days Discharge Instructions: Use vashe 5.8 (oz) as directed Topical: Gentamicin 1 x Per Week/30 Days Discharge Instructions: Apply as directed by provider. [name], [name] E[facility] ([id number]) [id number] Page 4 of 7 Topical: Mupirocin Ointment 1 x Per Week/30 Days Discharge Instructions: Apply as directed by provider. Prim Dressing: Hydrofera Blue Ready Transfer Foam, 2.5x2.5 (in/in) 1 x Per Week/30 Days ary Discharge Instructions: Apply Hydrofera Blue Ready to wound bed as directed Secondary Dressing: ABD Pad 5x9 (in/in) 1 x Per Week/30 Days Discharge Instructions: Cover with ABD pad Secured With: Coban Cohesive Bandage 4x5 (yds) Stretched 1 x Per Week/30 Days Discharge Instructions: Apply Coban as directed. Secured With: [employer] or Non-Sterile 6-ply 4.5x4 (yd/yd) 1 x Per Week/30 Days Discharge Instructions: Apply Kerlix as directed Wound #2 - Lower Leg Wound Laterality: Right, Lateral Cleanser: Byram Ancillary Kit - 15 Day Supply (DME) (Generic) 1 x Per Day/30 Days Discharge Instructions: Use supplies as instructed; Kit contains: (15) Saline Bullets; (15) 3x3 Gauze; 15 pr Gloves Cleanser: Soap and Water 1 x Per Day/30 Days Discharge Instructions: Gently cleanse wound with antibacterial soap, rinse and pat dry prior to dressing wounds Prim Dressing: Xeroform 4x4-HBD (in/in) (DME) (Generic) 1 x Per Day/30 Days ary Discharge Instructions: Apply  Xeroform 4x4-HBD (in/in) as directed Secondary Dressing: Conforming Guaze Roll-Medium 1 x Per Day/30 Days Discharge Instructions: Apply Conforming Stretch Guaze Bandage as directed Secured With: Medipore T - 17M Medipore H Soft Cloth Surgical T ape ape, 2x2 (in/yd) (DME) (Generic) 1 x Per Day/30 Days Electronic Signature(s) Signed: 10/25/2022 4:43:36 PM By: Rosalio Loud MSN RN CNS WTA Signed: 10/30/2022 9:11:39 AM By: Kalman Shan DO Previous Signature: 10/25/2022 2:42:19 PM Version By: Kalman Shan DO Entered By: Rosalio Loud on 10/25/2022 16:43:36 -------------------------------------------------------------------------------- Problem List Details Patient Name: Date of Service: NO RMA [name] 10/25/2022 1:15 PM Medical Record Number: [id number] Patient Account Number: [id number] Date of Birth/Sex: Treating RN: 03/13/1951 (72 y.o. [name] Primary Care Provider: Crist Infante Other Clinician: Referring Provider: Treating Provider/Extender: Deatra James in Treatment: 1 Active  Problems ICD-10 Encounter Code Description Active Date MDM Diagnosis T81.31XA Disruption of external operation (surgical) wound, not elsewhere classified, 10/18/2022 No Yes initial encounter S81.802A Unspecified open wound, left lower leg, initial encounter 10/18/2022 No Yes [name], [name] ([id number]) [id number] Page 5 of 7 C44.91 Basal cell carcinoma of skin, unspecified 10/18/2022 No Yes S81.801A Unspecified open wound, right lower leg, initial encounter 10/18/2022 No Yes I48.20 Chronic atrial fibrillation, unspecified 10/18/2022 No Yes Z79.01 Long term (current) use of anticoagulants 10/18/2022 No Yes Inactive Problems Resolved Problems Electronic Signature(s) Signed: 10/25/2022 4:44:51 PM By: Rosalio Loud MSN RN CNS WTA Signed: 10/30/2022 9:11:39 AM By: Kalman Shan DO Previous Signature: 10/25/2022 2:42:19 PM Version By: Kalman Shan  DO Entered By: Rosalio Loud on 10/25/2022 16:44:51 -------------------------------------------------------------------------------- Progress Note Details Patient Name: Date of Service: NO RMA [name] 10/25/2022 1:15 PM Medical Record Number: [id number] Patient Account Number: [id number] Date of Birth/Sex: Treating RN: Jun 07, 1951 (72 y.o. [name] Primary Care Provider: Crist Infante Other Clinician: Referring Provider: Treating Provider/Extender: Deatra James in Treatment: 1 Subjective Chief Complaint Information obtained from Patient 10/18/2022; left lower extremity wound status post [facility] removal with mohs surgery, right lower extremity skin tear from fall History of Present Illness (HPI) 10/18/2022 [name] is a 72 year old female with a past medical history of A-fib on Eliquis, Wegener's granulomatosis with vasculitis, and basal cell carcinoma of the left leg that presents the clinic for a wound to the left leg status post Mohs procedure for basal cell carcinoma removal and skin tear to the right lower extremity that occurred 3 days ago after a fall. She has been using Vaseline to the wound beds. She has compression stockings and has been using these. She currently has chronic pain to the left leg wound site with slight erythema and warmth to the periwound. 3/6; patient presents for follow-up. She has been using Medihoney and Hydrofera Blue to the left lower extremity wound and Xeroform to the right lower extremity wound. She denies signs of infection. She completed her oral antibiotics. Objective Constitutional Vitals Time Taken: 1:41 PM, Height: 61 in, Weight: 160 lbs, BMI: 30.2, Temperature: 98.3 F, Pulse: 96 bpm, Respiratory Rate: 16 breaths/min, Blood Pressure: [name], [name] ([id number]) [id number] Page 6 of 7 124/78 mmHg. General Notes: Left lower extremity: Open wound with nonviable surface  throughout, Now with slight bloody granulation tissue. Right lower extremity to the anterior aspect there is a skin tear with granulation and bruising. No signs of surrounding infection. 2+ pitting edema to the knees bilaterally. venous stasis dermatitis. Integumentary (Hair, Skin) Wound #1 status is Open. Original cause of wound was Surgical Injury. The date acquired was: 09/23/2022. The wound has been in treatment 1 weeks. The wound is located on the Left,Medial Ankle. The wound measures 1.3cm length x 1.5cm width x 0.2cm depth; 1.532cm^2 area and 0.306cm^3 volume. There is Fat Layer (Subcutaneous Tissue) exposed. There is a medium amount of serosanguineous drainage noted. There is no granulation within the wound bed. There is a large (67-100%) amount of necrotic tissue within the wound bed including Adherent Slough. Wound #2 status is Open. Original cause of wound was Skin T ear/Laceration. The date acquired was: 10/14/2022. The wound has been in treatment 1 weeks. The wound is located on the Right,Lateral Lower Leg. The wound measures 1.5cm length x 8cm width x 0.1cm depth; 9.425cm^2 area and 0.942cm^3 volume. There is a medium amount of sanguinous drainage noted. There is medium (34-66%) red granulation  within the wound bed. There is no necrotic tissue within the wound bed. Assessment Active Problems ICD-10 Disruption of external operation (surgical) wound, not elsewhere classified, initial encounter Unspecified open wound, left lower leg, initial encounter Basal cell carcinoma of skin, unspecified Unspecified open wound, right lower leg, initial encounter Chronic atrial fibrillation, unspecified Long term (current) use of anticoagulants Patient's left lower extremity wound is stable. The right lower extremity wound appears well-healing. I recommended continuing the course with Xeroform to the right lower extremity. At this time I recommended a compression wrap to the left lower extremity to  help with her swelling control and further improve wound healing. We will continue Hydrofera Blue but add antibiotic ointment under the wrap to the left lower extremity. She knows to not get this wet or keep this on for more than 7 days. Follow-up in 1 week. Plan 1. Hydrofera Blue with antibiotic ointment under Kerlix/Cobanooleft lower extremity 2. Xeroformooright lower extremity 3. Daily compression stocking to the right lower extremity 4. Follow-up in 1 week Electronic Signature(s) Signed: 10/25/2022 2:42:19 PM By: Kalman Shan DO Entered By: Kalman Shan on 10/25/2022 14:13:37 -------------------------------------------------------------------------------- SuperBill Details Patient Name: Date of Service: NO RMA [name] 10/25/2022 Medical Record Number: [id number] Patient Account Number: [id number] Date of Birth/Sex: Treating RN: [birth year] (72 y.o. [name] Primary Care Provider: Crist Infante Other Clinician: Referring Provider: Treating Provider/Extender: Deatra James in Treatment: 1 Diagnosis Coding [name], [name] [name] ([id number]) [id number] Page 7 of 7 ICD-10 Codes Code Description T81.31XA Disruption of external operation (surgical) wound, not elsewhere classified, initial encounter S81.802A Unspecified open wound, left lower leg, initial encounter C44.91 Basal cell carcinoma of skin, unspecified S81.801A Unspecified open wound, right lower leg, initial encounter I48.20 Chronic atrial fibrillation, unspecified Z79.01 Long term (current) use of anticoagulants Facility Procedures : CPT4 Code: [id number] Description: 11042 - DEB SUBQ TISSUE 20 SQ CM/< ICD-10 Diagnosis Description S81.802A Unspecified open wound, left lower leg, initial encounter Modifier: Quantity: 1 Physician Procedures : CPT4 Code Description Modifier [id number] - WC PHYS LEVEL 3 - EST PT ICD-10 Diagnosis Description T81.31XA  Disruption of external operation (surgical) wound, not elsewhere classified, initial encounter S81.802A Unspecified open wound, left lower  leg, initial encounter S81.801A Unspecified open wound, right lower leg, initial encounter Quantity: 1 : [id number] - WC PHYS SUBQ TISS 20 SQ CM ICD-10 Diagnosis Description S81.802A Unspecified open wound, left lower leg, initial encounter Quantity: 1 Electronic Signature(s) Signed: 10/25/2022 4:44:19 PM By: Rosalio Loud MSN RN CNS WTA Signed: 10/30/2022 9:11:39 AM By: Kalman Shan DO Previous Signature: 10/25/2022 2:42:19 PM Version By: Kalman Shan DO Entered By: Rosalio Loud on 10/25/2022 16:44:19

## 2022-11-01 DIAGNOSIS — I10 Essential (primary) hypertension: Secondary | ICD-10-CM | POA: Diagnosis not present

## 2022-11-01 DIAGNOSIS — L97822 Non-pressure chronic ulcer of other part of left lower leg with fat layer exposed: Secondary | ICD-10-CM | POA: Diagnosis not present

## 2022-11-01 DIAGNOSIS — L97322 Non-pressure chronic ulcer of left ankle with fat layer exposed: Secondary | ICD-10-CM | POA: Diagnosis not present

## 2022-11-01 DIAGNOSIS — I872 Venous insufficiency (chronic) (peripheral): Secondary | ICD-10-CM | POA: Diagnosis not present

## 2022-11-01 DIAGNOSIS — G8929 Other chronic pain: Secondary | ICD-10-CM | POA: Diagnosis not present

## 2022-11-01 DIAGNOSIS — M313 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: Secondary | ICD-10-CM | POA: Diagnosis not present

## 2022-11-03 NOTE — Progress Notes (Signed)
LABARBARA, Kaufman [name] ([id number]) [id number] Page 1 of 6 Visit Report for 11/01/2022 Arrival Information Details Patient Name: Date of Service: NO RMA [name] 11/01/2022 8:30 A M Medical Record Number: [id number] Patient Account Number: [id number] Date of Birth/Sex: Treating RN: 1951-08-13 (72 y.o. [name] Primary Care Saliou Barnier: Crist Infante Other Clinician: Massie Kluver Referring Gerald Honea: Treating Bibiana Gillean/Extender: Deatra James in Treatment: 2 Visit Information History Since Last Visit All ordered tests and consults were completed: No Patient Arrived: Ambulatory Added or deleted any medications: No Arrival Time: 08:47 Any new allergies or adverse reactions: No Transfer Assistance: None Had a fall or experienced change in No Patient Identification Verified: Yes activities of daily living that may affect Secondary Verification Process Completed: Yes risk of falls: Patient Requires Transmission-Based Precautions: No Signs or symptoms of abuse/neglect since last visito No Patient Has Alerts: Yes Hospitalized since last visit: No Patient Alerts: Patient on Blood Thinner Implantable device outside of the clinic excluding No Eliquis//ASA cellular tissue based products placed in the center Not Diabetic since last visit: Has Dressing in Place as Prescribed: Yes Has Compression in Place as Prescribed: Yes Pain Present Now: Yes Electronic Signature(s) Signed: 11/01/2022 11:54:14 AM By: Massie Kluver Entered By: Massie Kluver on 11/01/2022 08:48:38 -------------------------------------------------------------------------------- Clinic Level of Care Assessment Details Patient Name: Date of Service: NO RMA [name] 11/01/2022 8:30 [place] Record Number: [id number] Patient Account Number: [id number] Date of Birth/Sex: Treating RN: 1951/01/27 (72 y.o. [name] Primary Care [place]: Crist Infante Other  Clinician: Massie Kluver Referring Simrit Gohlke: Treating Mahli Glahn/Extender: Deatra James in Treatment: 2 Clinic Level of Care Assessment Items TOOL 4 Quantity Score []  - 0 Use when only an EandM is performed on FOLLOW-UP visit ASSESSMENTS - Nursing Assessment / Reassessment X- 1 10 Reassessment of Co-morbidities (includes updates in patient status) Joyce, Kaufman ([id number]) [id number] Page 2 of 6 X- 1 5 Reassessment of Adherence to Treatment Plan ASSESSMENTS - Wound and Skin A ssessment / Reassessment []  - 0 Simple Wound Assessment / Reassessment - one wound X- 2 5 Complex Wound Assessment / Reassessment - multiple wounds []  - 0 Dermatologic / Skin Assessment (not related to wound area) ASSESSMENTS - Focused Assessment []  - 0 Circumferential Edema Measurements - multi extremities []  - 0 Nutritional Assessment / Counseling / Intervention []  - 0 Lower Extremity Assessment (monofilament, tuning fork, pulses) []  - 0 Peripheral Arterial Disease Assessment (using hand held doppler) ASSESSMENTS - Ostomy and/or Continence Assessment and Care []  - 0 Incontinence Assessment and Management []  - 0 Ostomy Care Assessment and Management (repouching, etc.) PROCESS - Coordination of Care X - Simple Patient / Family Education for ongoing care 1 15 []  - 0 Complex (extensive) Patient / Family Education for ongoing care []  - 0 Staff obtains Programmer, systems, Records, T Results / Process Orders est []  - 0 Staff telephones HHA, Nursing Homes / Clarify orders / etc []  - 0 Routine Transfer to another Facility (non-emergent condition) []  - 0 Routine Hospital Admission (non-emergent condition) []  - 0 New Admissions / Biomedical engineer / Ordering NPWT Apligraf, etc. , []  - 0 Emergency Hospital Admission (emergent condition) X- 1 10 Simple Discharge Coordination []  - 0 Complex (extensive) Discharge Coordination PROCESS - Special  Needs []  - 0 Pediatric / Minor Patient Management []  - 0 Isolation Patient Management []  - 0 Hearing / Language / Visual special needs []  - 0 Assessment of Community assistance (transportation, D/C planning, etc.) []  -  0 Additional assistance / Altered mentation []  - 0 Support Surface(s) Assessment (bed, cushion, seat, etc.) INTERVENTIONS - Wound Cleansing / Measurement []  - 0 Simple Wound Cleansing - one wound X- 2 5 Complex Wound Cleansing - multiple wounds X- 1 5 Wound Imaging (photographs - any number of wounds) []  - 0 Wound Tracing (instead of photographs) []  - 0 Simple Wound Measurement - one wound X- 2 5 Complex Wound Measurement - multiple wounds INTERVENTIONS - Wound Dressings X - Small Wound Dressing one or multiple wounds 2 10 []  - 0 Medium Wound Dressing one or multiple wounds []  - 0 Large Wound Dressing one or multiple wounds X- 1 5 Application of Medications - topical []  - 0 Application of Medications - injection INTERVENTIONS - Miscellaneous Joyce, Kaufman JANETTE ([id number]) [id number] Page 3 of 6 []  - 0 External ear exam []  - 0 Specimen Collection (cultures, biopsies, blood, body fluids, etc.) []  - 0 Specimen(s) / Culture(s) sent or taken to Lab for analysis []  - 0 Patient Transfer (multiple staff / Harrel Lemon Lift / Similar devices) []  - 0 Simple Staple / Suture removal (25 or less) []  - 0 Complex Staple / Suture removal (26 or more) []  - 0 Hypo / Hyperglycemic Management (close monitor of Blood Glucose) []  - 0 Ankle / Brachial Index (ABI) - do not check if billed separately []  - 0 Vital Signs Has the patient been seen at the hospital within the last three years: Yes Total Score: 100 Level Of Care: New/Established - Level 3 Electronic Signature(s) Signed: 11/01/2022 11:54:14 AM By: Massie Kluver Entered By: Massie Kluver on 11/01/2022  09:19:28 -------------------------------------------------------------------------------- Encounter Discharge Information Details Patient Name: Date of Service: NO RMA [name] [name] 11/01/2022 8:30 [place] Record Number: [id number] Patient Account Number: [id number] Date of Birth/Sex: Treating RN: 1950/11/08 (72 y.o. [name], [name] Primary Care Gustav Knueppel: Crist Infante Other Clinician: Massie Kluver Referring Edward Trevino: Treating Lamiah Marmol/Extender: Deatra James in Treatment: 2 Encounter Discharge Information Items Discharge Condition: Stable Ambulatory Status: Ambulatory Discharge Destination: Home Transportation: Private Auto Accompanied By: self Schedule Follow-up Appointment: Yes Clinical Summary of Care: Electronic Signature(s) Signed: 11/01/2022 11:54:14 AM By: Massie Kluver Entered By: Massie Kluver on 11/01/2022 09:17:54 [name] ([id number]) [id number] Page 4 of 6 -------------------------------------------------------------------------------- Wound Assessment Details Patient Name: Date of Service: NO RMA [name] 11/01/2022 8:30 A M Medical Record Number: [id number] Patient Account Number: [id number] Date of Birth/Sex: Treating RN: October 27, 1950 (72 y.o. [name], [name] Primary Care Stanford Strauch: Crist Infante Other Clinician: Massie Kluver Referring Breshae Belcher: Treating Kanden Carey/Extender: Deatra James in Treatment: 2 Wound Status Wound Number: 1 Primary Etiology: Atypical Wound Location: Left, Medial Ankle Wound Status: Open Wounding Event: Surgical Injury Comorbid History: Hypertension, Neuropathy Date Acquired: 09/23/2022 Weeks Of Treatment: 2 Clustered Wound: No Photos Wound Measurements Length: (cm) 1.6 Width: (cm) 1.4 Depth: (cm) 0.2 Area: (cm) 1.759 Volume: (cm) 0.352 % Reduction in Area: 30% % Reduction in Volume: 30% Epithelialization: None Wound  Description Classification: Full Thickness Without Exposed Support Exudate Amount: Medium Exudate Type: Serosanguineous Exudate Color: red, brown Structures Foul Odor After Cleansing: No Slough/Fibrino Yes Wound Bed Granulation Amount: None Present (0%) Exposed Structure Necrotic Amount: Large (67-100%) Fascia Exposed: No Necrotic Quality: Adherent Slough Fat Layer (Subcutaneous Tissue) Exposed: Yes Tendon Exposed: No Muscle Exposed: No Joint Exposed: No Bone Exposed: No Treatment Notes Wound #1 (Ankle) Wound Laterality: Left, Medial Cleanser Vashe 5.8 (oz) Discharge Instruction: Use vashe 5.8 (oz) as directed Peri-Wound  Care Topical Gentamicin Discharge Instruction: Apply as directed by Shaqueta Casady. Mupirocin Ointment Discharge Instruction: Apply as directed by Dynver [place]. Primary Dressing Hydrofera Blue Ready Transfer Foam, 2.5x2.5 (in/in) Discharge Instruction: Apply Hydrofera Blue Ready to wound bed as directed ITZAMAR, ROSKAM ([id number]) [id number] Page 5 of 6 Secondary Dressing ABD Pad 5x9 (in/in) Discharge Instruction: Cover with ABD pad Secured With Coban Cohesive Bandage 4x5 (yds) Stretched Discharge Instruction: Apply Coban as directed. Kerlix Roll Sterile or Non-Sterile 6-ply 4.5x4 (yd/yd) Discharge Instruction: Apply Kerlix as directed Compression Wrap Compression Stockings Add-Ons Electronic Signature(s) Signed: 11/01/2022 11:54:14 AM By: Massie Kluver Signed: 11/02/2022 3:27:10 PM By: Gretta Cool, BSN, RN, CWS, [name] RN, BSN Entered By: Massie Kluver on 11/01/2022 09:17:00 -------------------------------------------------------------------------------- Wound Assessment Details Patient Name: Date of Service: NO RMA [name] [name] 11/01/2022 8:30 A M Medical Record Number: [id number] Patient Account Number: [id number] Date of Birth/Sex: Treating RN: [birth year] (72 y.o. [name], [name] Primary Care Avery [place]: Crist Infante Other  Clinician: Massie Kluver Referring Hamna Asa: Treating Arrietty Dercole/Extender: Deatra James in Treatment: 2 Wound Status Wound Number: 2 Primary Etiology: Skin Tear Wound Location: Right, Lateral Lower Leg Wound Status: Open Wounding Event: Skin Tear/Laceration Comorbid History: Hypertension, Neuropathy Date Acquired: 10/14/2022 Weeks Of Treatment: 2 Clustered Wound: Yes Photos Wound Measurements Length: (cm) 0.2 Width: (cm) 0.2 Depth: (cm) 0.2 Area: (cm) 0.031 Volume: (cm) 0.006 % Reduction in Area: 99.9% % Reduction in Volume: 99.7% Epithelialization: Small (1-33%) Wound Description TELSA, NAGASAWA ([id number]) Classification: Full Thickness Without Exposed Support Structures Exudate Amount: Medium Exudate Type: Sanguinous Exudate Color: red [id number] Page 6 of 6 Foul Odor After Cleansing: No Slough/Fibrino No Wound Bed Granulation Amount: Medium (34-66%) Exposed Structure Granulation Quality: Red Fascia Exposed: No Necrotic Amount: None Present (0%) Fat Layer (Subcutaneous Tissue) Exposed: No Tendon Exposed: No Muscle Exposed: No Joint Exposed: No Bone Exposed: No Treatment Notes Wound #2 (Lower Leg) Wound Laterality: Right, Lateral Cleanser Byram Ancillary Kit - 15 Day Supply Discharge Instruction: Use supplies as instructed; Kit contains: (15) Saline Bullets; (15) 3x3 Gauze; 15 pr Gloves Soap and Water Discharge Instruction: Gently cleanse wound with antibacterial soap, rinse and pat dry prior to dressing wounds Peri-Wound Care Topical Primary Dressing Xeroform 4x4-HBD (in/in) Discharge Instruction: Apply Xeroform 4x4-HBD (in/in) as directed Secondary Dressing Bascom Roll-Medium Discharge Instruction: Apply Conforming Stretch Guaze Bandage as directed Secured With Medipore T - 39M Medipore H Soft Cloth Surgical T ape ape, 2x2 (in/yd) Compression Wrap Compression  Stockings Add-Ons Electronic Signature(s) Signed: 11/01/2022 11:54:14 AM By: Massie Kluver Signed: 11/02/2022 3:27:10 PM By: Gretta Cool, BSN, RN, CWS, [name] RN, BSN Entered By: Massie Kluver on 11/01/2022 09:16:28

## 2022-11-03 NOTE — Progress Notes (Signed)
[name], [name] [place] ([id number]) [id number] Page 1 of 2 Visit Report for 11/01/2022 Physician Orders Details Patient Name: Date of Service: NO RMA Jamelle Haring [name] 11/01/2022 8:30 A M Medical Record Number: [id number] Patient Account Number: [id number] Date of Birth/Sex: Treating RN: 1950/09/10 (72 y.o. [name] Primary Care Provider: Crist Infante Other Clinician: Massie Kluver Referring Provider: Treating Provider/Extender: Deatra James in Treatment: 2 Verbal / Phone Orders: No Diagnosis Coding Follow-up Appointments Return Appointment in 1 week. Nurse Visit as needed Bathing/ Shower/ Hygiene Wound #1 Left,Medial Ankle May shower with wound dressing protected with water repellent cover or cast protector. No tub bath. Anesthetic (Use 'Patient Medications' Section for Anesthetic Order Entry) Lidocaine applied to wound bed Edema Control - Lymphedema / Segmental Compressive Device / Other Wound #1 Left,Medial Ankle Other: - Kerlix and Coban Wound Treatment Wound #1 - Ankle Wound Laterality: Left, Medial Cleanser: Vashe 5.8 (oz) 1 x Per Week/30 Days Discharge Instructions: Use vashe 5.8 (oz) as directed Topical: Gentamicin 1 x Per Week/30 Days Discharge Instructions: Apply as directed by provider. Topical: Mupirocin Ointment 1 x Per Week/30 Days Discharge Instructions: Apply as directed by provider. Prim Dressing: Hydrofera Blue Ready Transfer Foam, 2.5x2.5 (in/in) 1 x Per Week/30 Days ary Discharge Instructions: Apply Hydrofera Blue Ready to wound bed as directed Secondary Dressing: ABD Pad 5x9 (in/in) 1 x Per Week/30 Days Discharge Instructions: Cover with ABD pad Secured With: Coban Cohesive Bandage 4x5 (yds) Stretched 1 x Per Week/30 Days Discharge Instructions: Apply Coban as directed. Secured With: [employer] or Non-Sterile 6-ply 4.5x4 (yd/yd) 1 x Per Week/30 Days Discharge Instructions: Apply Kerlix as  directed Wound #2 - Lower Leg Wound Laterality: Right, Lateral Cleanser: Byram Ancillary Kit - 15 Day Supply (Generic) 1 x Per Day/30 Days Discharge Instructions: Use supplies as instructed; Kit contains: (15) Saline Bullets; (15) 3x3 Gauze; 15 pr Gloves Cleanser: Soap and Water 1 x Per Day/30 Days Discharge Instructions: Gently cleanse wound with antibacterial soap, rinse and pat dry prior to dressing wounds Prim Dressing: Xeroform 4x4-HBD (in/in) (Generic) 1 x Per Day/30 Days ary RICHELE, Kaufman ([id number]) [id number] Page 2 of 2 Discharge Instructions: Apply Xeroform 4x4-HBD (in/in) as directed Secondary Dressing: Conforming Guaze Roll-Medium 1 x Per Day/30 Days Discharge Instructions: [place] as directed Secured With: Medipore T - 2M Medipore H Soft Cloth Surgical T ape ape, 2x2 (in/yd) (Generic) 1 x Per Day/30 Days Electronic Signature(s) Signed: 11/02/2022 12:13:34 PM By: Massie Kluver Signed: 11/02/2022 3:53:59 PM By: Kalman Shan DO Previous Signature: 11/01/2022 11:54:14 AM Version By: Massie Kluver Entered By: Massie Kluver on 11/02/2022 08:49:31 -------------------------------------------------------------------------------- SuperBill Details Patient Name: Date of Service: NO RMA [name] [name] 11/01/2022 Medical Record Number: [id number] Patient Account Number: [id number] Date of Birth/Sex: Treating RN: 1951/08/15 (72 y.o. [name] Primary Care Provider: Crist Infante Other Clinician: Massie Kluver Referring Provider: Treating Provider/Extender: Deatra James in Treatment: 2 Diagnosis Coding ICD-10 Codes Code Description T81.31XA Disruption of external operation (surgical) wound, not elsewhere classified, initial encounter S81.802A Unspecified open wound, left lower leg, initial encounter C44.91 Basal cell carcinoma of skin, unspecified S81.801A Unspecified open wound,  right lower leg, initial encounter I48.20 Chronic atrial fibrillation, unspecified Z79.01 Long term (current) use of anticoagulants Facility Procedures : CPT4 Code: [id number] Description: 99213 - WOUND CARE VISIT-LEV 3 EST PT Modifier: Quantity: 1 Electronic Signature(s) Signed: 11/01/2022 11:54:14 AM By: Massie Kluver Signed: 11/02/2022 3:53:59 PM By: Kalman Shan DO Entered  ByMassie Kluver on 11/01/2022 09:19:34

## 2022-11-06 DIAGNOSIS — R2681 Unsteadiness on feet: Secondary | ICD-10-CM | POA: Diagnosis not present

## 2022-11-06 DIAGNOSIS — Z7901 Long term (current) use of anticoagulants: Secondary | ICD-10-CM | POA: Diagnosis not present

## 2022-11-06 DIAGNOSIS — I10 Essential (primary) hypertension: Secondary | ICD-10-CM | POA: Diagnosis not present

## 2022-11-06 DIAGNOSIS — D6869 Other thrombophilia: Secondary | ICD-10-CM | POA: Diagnosis not present

## 2022-11-06 DIAGNOSIS — M545 Low back pain, unspecified: Secondary | ICD-10-CM | POA: Diagnosis not present

## 2022-11-06 DIAGNOSIS — R011 Cardiac murmur, unspecified: Secondary | ICD-10-CM | POA: Diagnosis not present

## 2022-11-06 DIAGNOSIS — D649 Anemia, unspecified: Secondary | ICD-10-CM | POA: Diagnosis not present

## 2022-11-06 DIAGNOSIS — M313 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: Secondary | ICD-10-CM | POA: Diagnosis not present

## 2022-11-06 DIAGNOSIS — I480 Paroxysmal atrial fibrillation: Secondary | ICD-10-CM | POA: Diagnosis not present

## 2022-11-06 DIAGNOSIS — D469 Myelodysplastic syndrome, unspecified: Secondary | ICD-10-CM | POA: Diagnosis not present

## 2022-11-06 DIAGNOSIS — E785 Hyperlipidemia, unspecified: Secondary | ICD-10-CM | POA: Diagnosis not present

## 2022-11-06 DIAGNOSIS — G8929 Other chronic pain: Secondary | ICD-10-CM | POA: Diagnosis not present

## 2022-11-07 ENCOUNTER — Encounter: Payer: Medicare Other | Admitting: Physician Assistant

## 2022-11-07 DIAGNOSIS — L97322 Non-pressure chronic ulcer of left ankle with fat layer exposed: Secondary | ICD-10-CM | POA: Diagnosis not present

## 2022-11-07 DIAGNOSIS — M313 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: Secondary | ICD-10-CM | POA: Diagnosis not present

## 2022-11-07 DIAGNOSIS — I10 Essential (primary) hypertension: Secondary | ICD-10-CM | POA: Diagnosis not present

## 2022-11-07 DIAGNOSIS — I872 Venous insufficiency (chronic) (peripheral): Secondary | ICD-10-CM | POA: Diagnosis not present

## 2022-11-07 DIAGNOSIS — L97822 Non-pressure chronic ulcer of other part of left lower leg with fat layer exposed: Secondary | ICD-10-CM | POA: Diagnosis not present

## 2022-11-07 DIAGNOSIS — S91002A Unspecified open wound, left ankle, initial encounter: Secondary | ICD-10-CM | POA: Diagnosis not present

## 2022-11-07 DIAGNOSIS — G8929 Other chronic pain: Secondary | ICD-10-CM | POA: Diagnosis not present

## 2022-11-07 NOTE — Progress Notes (Addendum)
[name], [name] [place] ([id number]) [id number] Page 1 of 7 Visit Report for 11/07/2022 Chief Complaint Document Details Patient Name: Date of Service: NO RMA [name] 11/07/2022 9:00 Joyce M Medical Record Number: [id number] Patient Account Number: [id number] Date of Birth/Sex: Treating RN: 10-05-1950 (72 y.o. [name] Primary Care Provider: Crist Kaufman Other Clinician: Massie Kaufman Referring Provider: Treating Provider/Extender: [name] in Treatment: 2 Information Obtained from: Patient Chief Complaint 10/18/2022; left lower extremity wound status post [facility] removal with mohs surgery, right lower extremity skin tear from fall Electronic Signature(s) Signed: 11/07/2022 9:16:28 AM By: Joyce Keeler PA-C Entered By: [name] on 11/07/2022 09:16:28 -------------------------------------------------------------------------------- Debridement Details Patient Name: Date of Service: NO RMA [name] 11/07/2022 9:00 [place] Record Number: [id number] Patient Account Number: [id number] Date of Birth/Sex: Treating RN: [birth year] (72 y.o. [name] Primary Care Provider: Crist Kaufman Other Clinician: Massie Kaufman Referring Provider: Treating Provider/Extender: [name] in Treatment: 2 Debridement Performed for Assessment: Wound #1 Left,Medial Ankle Performed By: Physician [name]., PA-C Debridement Type: Debridement Level of Consciousness (Pre-procedure): Awake and Alert Pre-procedure Verification/Time Out Yes - 09:48 Taken: Start Time: 09:48 T Area Debrided (L x W): otal 2 (cm) x 1.5 (cm) = 3 (cm) Tissue and other material debrided: Viable, Non-Viable, Slough, Subcutaneous, Biofilm, Slough Level: Skin/Subcutaneous Tissue Debridement Description: Excisional Instrument: Curette Bleeding: Minimum Hemostasis Achieved: Pressure Response to Treatment: Procedure was tolerated  well Level of Consciousness (Post- Awake and Alert procedure): Post Debridement Measurements of Total Wound ADISYNN, AROSTEGUI Kaufman ([id number]) [id number] Page 2 of 7 Length: (cm) 1.5 Width: (cm) 1.2 Depth: (cm) 0.3 Volume: (cm) 0.424 Character of Wound/Ulcer Post Debridement: Stable Post Procedure Diagnosis Same as Pre-procedure Electronic Signature(s) Signed: 11/07/2022 6:19:02 PM By: Joyce Keeler PA-C Signed: 11/08/2022 8:02:39 AM By: [name], [name] RN, BSN Signed: 11/08/2022 5:18:06 PM By: [name] Entered By: [name] on 11/07/2022 09:51:51 -------------------------------------------------------------------------------- HPI Details Patient Name: Date of Service: NO RMA [name] 11/07/2022 9:00 [place] Record Number: [id number] Patient Account Number: [id number] Date of Birth/Sex: Treating RN: Aug 24, 1950 (72 y.o. [name] Primary Care Provider: Crist Kaufman Other Clinician: Massie Kaufman Referring Provider: Treating Provider/Extender: [name] in Treatment: 2 History of Present Illness HPI Description: 10/18/2022 [name] is Joyce 72 year old female with Joyce past medical history of Joyce-fib on Eliquis, Wegener's granulomatosis with vasculitis, and basal cell carcinoma of the left leg that presents the clinic for Joyce wound to the left leg status post Mohs procedure for basal cell carcinoma removal and skin tear to the right lower extremity that occurred 3 days ago after Joyce fall. She has been using Vaseline to the wound beds. She has compression stockings and has been using these. She currently has chronic pain to the left leg wound site with slight erythema and warmth to the periwound. 3/6; patient presents for follow-up. She has been using Medihoney and Hydrofera Blue to the left lower extremity wound and Xeroform to the right lower extremity wound. She denies signs of infection. She  completed her oral antibiotics. 11-07-2022 upon evaluation today patient appears to be doing well currently in regard to her wound. This actually did require some sharp debridement to clearway the necrotic debris. She actually tolerated that debridement today well. Electronic Signature(s) Signed: 11/07/2022 6:01:26 PM By: Joyce Keeler PA-C Entered By: [name] on 11/07/2022 18:01:26 --------------------------------------------------------------------------------  Physical Exam Details Patient Name: Date of Service: NO RMA [name] 11/07/2022 9:00 [place] Record Number: [id number] Patient Account Number: [id number] Date of Birth/Sex: Treating RN: 03-23-1951 (72 y.o. [address] [employer] [place] ([id number]) [id number] Page 3 of 7 Primary Care Provider: Crist Kaufman Other Clinician: Massie Kaufman Referring Provider: Treating Provider/Extender: [name] in Treatment: 2 Constitutional Well-nourished and well-hydrated in no acute distress. Respiratory normal breathing without difficulty. Psychiatric this patient is able to make decisions and demonstrates good insight into disease process. Alert and Oriented x 3. pleasant and cooperative. Notes Upon inspection patient's wound bed actually showed signs of good granulation underneath some of the slough and biofilm buildup. I did perform debridement to clearway some of the necrotic debris she tolerated that today without complication and postdebridement the wound bed is significantly improved which is great news. Electronic Signature(s) Signed: 11/07/2022 6:01:43 PM By: Joyce Keeler PA-C Entered By: [name] on 11/07/2022 18:01:43 -------------------------------------------------------------------------------- Physician Orders Details Patient Name: Date of Service: NO RMA [name] 11/07/2022 9:00 [place] Record Number: [id number] Patient Account  Number: [id number] Date of Birth/Sex: Treating RN: 1951-05-19 (72 y.o. [name] Primary Care Provider: Crist Kaufman Other Clinician: Massie Kaufman Referring Provider: Treating Provider/Extender: [name] in Treatment: 2 Verbal / Phone Orders: Yes Clinician: Cornell Barman Read Back and Verified: Yes Diagnosis Coding ICD-10 Coding Code Description T81.31XA Disruption of external operation (surgical) wound, not elsewhere classified, initial encounter S81.802A Unspecified open wound, left lower leg, initial encounter C44.91 Basal cell carcinoma of skin, unspecified S81.801A Unspecified open wound, right lower leg, initial encounter I48.20 Chronic atrial fibrillation, unspecified Z79.01 Long term (current) use of anticoagulants Follow-up Appointments Return Appointment in 1 week. Nurse Visit as needed Bathing/ Shower/ Hygiene Wound #1 Left,Medial Ankle Wash wounds with antibacterial soap and water. May shower; gently cleanse wound with antibacterial soap, rinse and pat dry prior to dressing wounds No tub bath. Anesthetic (Use 'Patient Medications' Section for Anesthetic Order Entry) Lidocaine applied to wound bed Edema Control - Lymphedema / Segmental Compressive Device / Other Wound #1 Left,Medial Ankle Patient to wear own compression stockings. Remove compression stockings every night before going to bed and put on every morning Joyce, Kaufman ([id number]) [id number] Page 4 of 7 when getting up. - Bilat lower legs Elevate, Exercise Daily and Joyce void Standing for Long Periods of Time. Wound Treatment Wound #1 - Ankle Wound Laterality: Left, Medial Cleanser: Vashe 5.8 (oz) 3 x Per Week/30 Days Discharge Instructions: Use vashe 5.8 (oz) as directed Prim Dressing: Prisma 4.34 (in) (DME) (Dispense As Written) 3 x Per Week/30 Days ary Discharge Instructions: Moisten w/normal saline or sterile water; Cover wound as directed. Do  not remove from wound bed. Secondary Dressing: (BORDER) Zetuvit Plus SILICONE BORDER Dressing 4x4 (in/in) (DME) (Dispense As Written) 3 x Per Week/30 Days Discharge Instructions: Please do not put silicone bordered dressings under wraps. Use non-bordered dressing only. Secured With: Tubigrip Size C, 2.75x10 (in/yd) 3 x Per Week/30 Days Discharge Instructions: Apply 3 Tubigrip C 3-finger-widths below knee to base of toes to secure dressing and/or for swelling. Electronic Signature(s) Signed: 11/09/2022 11:20:18 AM By: [name] Signed: 11/09/2022 5:06:45 PM By: Joyce Keeler PA-C Previous Signature: 11/07/2022 6:19:02 PM Version By: Joyce Keeler PA-C Previous Signature: 11/08/2022 5:18:06 PM Version By: [name] Entered By: [name] on 11/09/2022 11:17:24 -------------------------------------------------------------------------------- Problem List Details Patient Name: Date of Service: NO  RMA [name] 11/07/2022 9:00 Joyce M Medical Record Number: [id number] Patient Account Number: [id number] Date of Birth/Sex: Treating RN: 06/02/1951 (72 y.o. [name] Primary Care Provider: Crist Kaufman Other Clinician: Massie Kaufman Referring Provider: Treating Provider/Extender: [name] in Treatment: 2 Active Problems ICD-10 Encounter Code Description Active Date MDM Diagnosis T81.31XA Disruption of external operation (surgical) wound, not elsewhere classified, 10/18/2022 No Yes initial encounter L97.322 Non-pressure chronic ulcer of left ankle with fat layer exposed 10/18/2022 No Yes C44.91 Basal cell carcinoma of skin, unspecified 10/18/2022 No Yes S81.811A Laceration without foreign body, right lower leg, initial encounter 10/18/2022 No Yes I48.20 Chronic atrial fibrillation, unspecified 10/18/2022 No Yes Z79.01 Long term (current) use of anticoagulants 10/18/2022 No Yes KIERYN, JUSTO ([id number]) [id number] Page 5  of 7 Inactive Problems Resolved Problems Electronic Signature(s) Signed: 11/07/2022 6:06:02 PM By: Joyce Keeler PA-C Previous Signature: 11/07/2022 9:16:25 AM Version By: Joyce Keeler PA-C Entered By: [name] on 11/07/2022 18:06:02 -------------------------------------------------------------------------------- Progress Note Details Patient Name: Date of Service: NO RMA [name] 11/07/2022 9:00 Joyce M Medical Record Number: [id number] Patient Account Number: [id number] Date of Birth/Sex: Treating RN: [birth year] (72 y.o. [name] Primary Care Provider: Crist Kaufman Other Clinician: Massie Kaufman Referring Provider: Treating Provider/Extender: [name] in Treatment: 2 Subjective Chief Complaint Information obtained from Patient 10/18/2022; left lower extremity wound status post [facility] removal with mohs surgery, right lower extremity skin tear from fall History of Present Illness (HPI) 10/18/2022 Ms. Staisha Vicary is Joyce 71 year old female with Joyce past medical history of Joyce-fib on Eliquis, Wegener's granulomatosis with vasculitis, and basal cell carcinoma of the left leg that presents the clinic for Joyce wound to the left leg status post Mohs procedure for basal cell carcinoma removal and skin tear to the right lower extremity that occurred 3 days ago after Joyce fall. She has been using Vaseline to the wound beds. She has compression stockings and has been using these. She currently has chronic pain to the left leg wound site with slight erythema and warmth to the periwound. 3/6; patient presents for follow-up. She has been using Medihoney and Hydrofera Blue to the left lower extremity wound and Xeroform to the right lower extremity wound. She denies signs of infection. She completed her oral antibiotics. 11-07-2022 upon evaluation today patient appears to be doing well currently in regard to her wound. This actually did require some sharp debridement to  clearway the necrotic debris. She actually tolerated that debridement today well. Objective Constitutional Well-nourished and well-hydrated in no acute distress. Vitals Time Taken: 9:11 AM, Height: 61 in, Weight: 160 lbs, BMI: 30.2, Temperature: 97.5 F, Pulse: 66 bpm, Respiratory Rate: 16 breaths/min, Blood Pressure: 134/65 mmHg. Respiratory normal breathing without difficulty. Psychiatric this patient is able to make decisions and demonstrates good insight into disease process. Alert and Oriented x 3. pleasant and cooperative. General Notes: Upon inspection patient's wound bed actually showed signs of good granulation underneath some of the slough and biofilm buildup. I did perform debridement to clearway some of the necrotic debris she tolerated that today without complication and postdebridement the wound bed is significantly Joyce, BAGNASCO Kaufman ([id number]) [id number] Page 6 of 7 improved which is great news. Integumentary (Hair, Skin) Wound #1 status is Open. Original cause of wound was Surgical Injury. The date acquired was: 09/23/2022. The wound has been in treatment 2 Kaufman. The wound is located on the Left,Medial Ankle. The wound  measures 1.5cm length x 1.2cm width x 0.2cm depth; 1.414cm^2 area and 0.283cm^3 volume. There is Fat Layer (Subcutaneous Tissue) exposed. There is Joyce medium amount of serosanguineous drainage noted. There is no granulation within the wound bed. There is Joyce large (67-100%) amount of necrotic tissue within the wound bed including Adherent Slough. Wound #2 status is Healed - Epithelialized. Original cause of wound was Skin Tear/Laceration. The date acquired was: 10/14/2022. The wound has been in treatment 2 Kaufman. The wound is located on the Right,Lateral Lower Leg. The wound measures 0cm length x 0cm width x 0cm depth; 0cm^2 area and 0cm^3 volume. There is Joyce none present amount of drainage noted. There is no granulation within the wound  bed. There is no necrotic tissue within the wound bed. Assessment Active Problems ICD-10 Disruption of external operation (surgical) wound, not elsewhere classified, initial encounter Non-pressure chronic ulcer of left ankle with fat layer exposed Basal cell carcinoma of skin, unspecified Laceration without foreign body, right lower leg, initial encounter Chronic atrial fibrillation, unspecified Long term (current) use of anticoagulants Procedures Wound #1 Pre-procedure diagnosis of Wound #1 is an Atypical located on the Left,Medial Ankle . There was Joyce Excisional Skin/Subcutaneous Tissue Debridement with Joyce total area of 3 sq cm performed by [name]., PA-C. With the following instrument(s): Curette to remove Viable and Non-Viable tissue/material. Material removed includes Subcutaneous Tissue, Slough, and Biofilm. Joyce time out was conducted at 09:48, prior to the start of the procedure. Joyce Minimum amount of bleeding was controlled with Pressure. The procedure was tolerated well. Post Debridement Measurements: 1.5cm length x 1.2cm width x 0.3cm depth; 0.424cm^3 volume. Character of Wound/Ulcer Post Debridement is stable. Post procedure Diagnosis Wound #1: Same as Pre-Procedure Plan Follow-up Appointments: Return Appointment in 1 week. Nurse Visit as needed Bathing/ Shower/ Hygiene: Wound #1 Left,Medial Ankle: Wash wounds with antibacterial soap and water. May shower; gently cleanse wound with antibacterial soap, rinse and pat dry prior to dressing wounds No tub bath. Anesthetic (Use 'Patient Medications' Section for Anesthetic Order Entry): Lidocaine applied to wound bed Edema Control - Lymphedema / Segmental Compressive Device / Other: Wound #1 Left,Medial Ankle: Patient to wear own compression stockings. Remove compression stockings every night before going to bed and put on every morning when getting up. - Bilat lower legs Elevate, Exercise Daily and Avoid Standing for Long  Periods of Time. WOUND #1: - Ankle Wound Laterality: Left, Medial Cleanser: Vashe 5.8 (oz) 3 x Per Week/30 Days Discharge Instructions: Use vashe 5.8 (oz) as directed Prim Dressing: Prisma 4.34 (in) (DME) (Dispense As Written) 3 x Per Week/30 Days ary Discharge Instructions: Moisten w/normal saline or sterile water; Cover wound as directed. Do not remove from wound bed. Secondary Dressing: (BORDER) Zetuvit Plus SILICONE BORDER Dressing 4x4 (in/in) (DME) (Dispense As Written) 3 x Per Week/30 Days Discharge Instructions: Please do not put silicone bordered dressings under wraps. Use non-bordered dressing only. Secured With: Tubigrip Size C, 2.75x10 (in/yd) 3 x Per Week/30 Days Discharge Instructions: Apply 3 Tubigrip C 3-finger-widths below knee to base of toes to secure dressing and/or for swelling. 1. I am good recommend currently that we have the patient continue to monitor for any signs of infection or worsening. Will get Joyce continue with the silver collagen which I think is can be Joyce good way to go we will use Joyce bordered foam dressing to cover and Tubigrip size D to go over top of this. 2. I am also can recommend that the patient should  continue to elevate her leg is much as possible to help with edema control. 3. I would also recommend that she monitor for any signs of infection or worsening of sleep anything changes she knows contact the office and let me know. We will see patient back for reevaluation in 1 week here in the clinic. If anything worsens or changes patient will contact our office for additional recommendations. Joyce, Kaufman [place] ([id number]) [id number] Page 7 of 7 Electronic Signature(s) Signed: 11/07/2022 6:06:12 PM By: Joyce Keeler PA-C Previous Signature: 11/07/2022 6:02:18 PM Version By: Joyce Keeler PA-C Entered By: [name] on 11/07/2022  18:06:12 -------------------------------------------------------------------------------- SuperBill Details Patient Name: Date of Service: NO RMA [name] 11/07/2022 Medical Record Number: [id number] Patient Account Number: [id number] Date of Birth/Sex: Treating RN: 1951-07-08 (72 y.o. [name] Primary Care Provider: Crist Kaufman Other Clinician: Massie Kaufman Referring Provider: Treating Provider/Extender: [name] in Treatment: 2 Diagnosis Coding ICD-10 Codes Code Description T81.31XA Disruption of external operation (surgical) wound, not elsewhere classified, initial encounter L97.322 Non-pressure chronic ulcer of left ankle with fat layer exposed C44.91 Basal cell carcinoma of skin, unspecified S81.811A Laceration without foreign body, right lower leg, initial encounter I48.20 Chronic atrial fibrillation, unspecified Z79.01 Long term (current) use of anticoagulants Facility Procedures : CPT4 Code: [id number] Description: 11042 - DEB SUBQ TISSUE 20 SQ CM/< ICD-10 Diagnosis Description L97.322 Non-pressure chronic ulcer of left ankle with fat layer exposed Modifier: Quantity: 1 Physician Procedures : CPT4 Code Description Modifier [id number] 11042 - WC PHYS SUBQ TISS 20 SQ CM ICD-10 Diagnosis Description L97.322 Non-pressure chronic ulcer of left ankle with fat layer exposed Quantity: 1 Electronic Signature(s) Signed: 11/07/2022 6:06:30 PM By: Joyce Keeler PA-C Entered By: [name] on 11/07/2022 18:06:29

## 2022-11-09 ENCOUNTER — Other Ambulatory Visit: Payer: Self-pay | Admitting: Internal Medicine

## 2022-11-09 DIAGNOSIS — R011 Cardiac murmur, unspecified: Secondary | ICD-10-CM

## 2022-11-09 NOTE — Progress Notes (Signed)
[name] ([id number]) [id number] Page 1 of 10 Visit Report for 11/07/2022 Arrival Information Details Patient Name: Date of Service: NO RMA [name] 11/07/2022 9:00 [place] Record Number: [id number] Patient Account Number: [id number] Date of Birth/Sex: Treating RN: March 17, 1951 (72 y.o. [name] Primary Care [name]: [name] Other Clinician: Massie Kaufman Referring [name]: Treating [name]/Extender: [name] in Treatment: 2 Visit Information History Since Last Visit All ordered tests and consults were completed: No Patient Arrived: Ambulatory Added or deleted any medications: No Arrival Time: 09:05 Any new allergies or adverse reactions: No Transfer Assistance: None Had a fall or experienced change in No Patient Identification Verified: Yes activities of daily living that may affect Secondary Verification Process Completed: Yes risk of falls: Patient Requires Transmission-Based Precautions: No Signs or symptoms of abuse/neglect since last visito No Patient Has Alerts: Yes Hospitalized since last visit: No Patient Alerts: Patient on Blood Thinner Implantable device outside of the clinic excluding No Eliquis//ASA cellular tissue based products placed in the center Not Diabetic since last visit: Has Dressing in Place as Prescribed: Yes Pain Present Now: No Electronic Signature(s) Signed: 11/07/2022 9:39:47 AM By: [name] Entered By: [name] on 11/07/2022 09:11:01 -------------------------------------------------------------------------------- Clinic Level of Care Assessment Details Patient Name: Date of Service: NO RMA [name] 11/07/2022 9:00 [place] Record Number: [id number] Patient Account Number: [id number] Date of Birth/Sex: Treating RN: [birth year] (72 y.o. [name] Primary Care [name]: [name] Other Clinician: Massie Kaufman Referring  [name]: Treating [name]/Extender: [name] in Treatment: 2 Clinic Level of Care Assessment Items TOOL 1 Quantity Score []  - 0 Use when EandM and Procedure is performed on INITIAL visit ASSESSMENTS - Nursing Assessment / Reassessment []  - 0 General Physical Exam (combine w/ comprehensive assessment (listed just below) when performed on new pt. evals) []  - 0 Comprehensive Assessment (HX, ROS, Risk Assessments, Wounds Hx, etc.) [name], [name] ([id number]) [id number] Page 2 of 10 ASSESSMENTS - Wound and Skin Assessment / Reassessment []  - 0 Dermatologic / Skin Assessment (not related to wound area) ASSESSMENTS - Ostomy and/or Continence Assessment and Care []  - 0 Incontinence Assessment and Management []  - 0 Ostomy Care Assessment and Management (repouching, etc.) PROCESS - Coordination of Care []  - 0 Simple Patient / Family Education for ongoing care []  - 0 Complex (extensive) Patient / Family Education for ongoing care []  - 0 Staff obtains Programmer, systems, Records, T Results / Process Orders est []  - 0 Staff telephones HHA, Nursing Homes / Clarify orders / etc []  - 0 Routine Transfer to another Facility (non-emergent condition) []  - 0 Routine Hospital Admission (non-emergent condition) []  - 0 New Admissions / Biomedical engineer / Ordering NPWT Apligraf, etc. , []  - 0 Emergency Hospital Admission (emergent condition) PROCESS - Special Needs []  - 0 Pediatric / Minor Patient Management []  - 0 Isolation Patient Management []  - 0 Hearing / Language / Visual special needs []  - 0 Assessment of Community assistance (transportation, D/C planning, etc.) []  - 0 Additional assistance / Altered mentation []  - 0 Support Surface(s) Assessment (bed, cushion, seat, etc.) INTERVENTIONS - Miscellaneous []  - 0 External ear exam []  - 0 Patient Transfer (multiple staff / Civil Service fast streamer / Similar devices) []  - 0 Simple Staple /  Suture removal (25 or less) []  - 0 Complex Staple / Suture removal (26 or more) []  - 0 Hypo/Hyperglycemic Management (do not check if billed separately) []  - 0 Ankle /  Brachial Index (ABI) - do not check if billed separately Has the patient been seen at the hospital within the last three years: Yes Total Score: 0 Level Of Care: ____ Electronic Signature(s) Signed: 11/08/2022 5:18:06 PM By: [name] Entered By: [name] on 11/07/2022 09:58:24 -------------------------------------------------------------------------------- Encounter Discharge Information Details Patient Name: Date of Service: NO RMA [name] 11/07/2022 9:00 [place] Record Number: [id number] Patient Account Number: [id number] Date of Birth/Sex: Treating RN: [birth year] (72 y.o. [name] Primary Care [name]: [name] Other Clinician: Massie Kaufman Referring Takayla Baillie: Treating [name] ([id number]) [id number] Page 3 of 10 Weeks in Treatment: 2 Encounter Discharge Information Items Post Procedure Vitals Discharge Condition: Stable Temperature (F): 97.5 Ambulatory Status: Ambulatory Pulse (bpm): 66 Discharge Destination: Home Respiratory Rate (breaths/min): 16 Transportation: Private Auto Blood Pressure (mmHg): 134/65 Accompanied By: self Schedule Follow-up Appointment: Yes Clinical Summary of Care: Electronic Signature(s) Signed: 11/08/2022 5:18:06 PM By: [name] Entered By: [name] on 11/07/2022 10:17:29 -------------------------------------------------------------------------------- Lower Extremity Assessment Details Patient Name: Date of Service: NO RMA [name] 11/07/2022 9:00 [facility] Medical Record Number: [id number] Patient Account Number: [id number] Date of Birth/Sex: Treating RN: 10-17-1950 (71 y.o. [name] Primary Care Miakoda Mcmillion: [name] Other Clinician: Massie Kaufman Referring [name]: Treating [name]/Extender: [name] Weeks in Treatment: 2 Edema Assessment Assessed: [Left: Yes] [Right: Yes] Edema: [Left: Yes] [Right: Yes] Calf Left: Right: Point of Measurement: 33 cm From Medial Instep 34 cm 34.6 cm Ankle Left: Right: Point of Measurement: 12 cm From Medial Instep 21.8 cm 21.5 cm Vascular Assessment Pulses: Dorsalis Pedis Palpable: [Left:Yes] [Right:Yes] Electronic Signature(s) Signed: 11/07/2022 9:39:47 AM By: [name] Signed: 11/08/2022 8:02:39 AM By: Gretta Cool, BSN, RN, CWS, [name] RN, BSN Entered By: [name] on 11/07/2022 09:27:24 [name] ([id number]) [id number] Page 4 of 10 -------------------------------------------------------------------------------- Multi Wound Chart Details Patient Name: Date of Service: NO RMA [name] 11/07/2022 9:00 A [facility] Medical Record Number: [id number] Patient Account Number: [id number] Date of Birth/Sex: Treating RN: February 02, 1951 (72 y.o. [name], [name] Primary Care Traivon Morrical: [name] Other Clinician: Massie Kaufman Referring Monaye Blackie: Treating Boyde Grieco/Extender: [name] in Treatment: 2 Vital Signs Height(in): 61 Pulse(bpm): 26 Weight(lbs): 160 Blood Pressure(mmHg): 134/65 Body Mass Index(BMI): 30.2 Temperature(F): 97.5 Respiratory Rate(breaths/min): 16 [1:Photos:] [N/A:N/A] Left, Medial Ankle Right, Lateral Lower Leg N/A Wound Location: Surgical Injury Skin T ear/Laceration N/A Wounding Event: Atypical Skin T ear N/A Primary Etiology: Hypertension, Neuropathy Hypertension, Neuropathy N/A Comorbid History: 09/23/2022 10/14/2022 N/A Date Acquired: 2 2 N/A Weeks of Treatment: Open Open N/A Wound Status: No No N/A Wound Recurrence: No Yes N/A Clustered Wound: 1.5x1.2x0.2 0.1x0.1x0.1 N/A Measurements L x W x D (cm) 1.414 0.008 N/A A (cm) : rea 0.283 0.001 N/A Volume (cm) : 43.70%  100.00% N/A % Reduction in A rea: 43.70% 100.00% N/A % Reduction in Volume: Full Thickness Without Exposed Full Thickness Without Exposed N/A Classification: Support Structures Support Structures Medium Medium N/A Exudate Amount: Serosanguineous Sanguinous N/A Exudate Type: red, brown red N/A Exudate Color: None Present (0%) Medium (34-66%) N/A Granulation Amount: N/A Red N/A Granulation Quality: Large (67-100%) None Present (0%) N/A Necrotic Amount: Fat Layer (Subcutaneous Tissue): Yes Fascia: No N/A Exposed Structures: Fascia: No Fat Layer (Subcutaneous Tissue): No Tendon: No Tendon: No Muscle: No Muscle: No Joint: No Joint: No Bone: No Bone: No None Small (1-33%) N/A Epithelialization: Treatment Notes Electronic Signature(s) Signed: 11/07/2022 9:39:47 AM  By: [name] Entered By: [name] on 11/07/2022 09:27:35 [name] ([id number]) [id number] Page 5 of 10 -------------------------------------------------------------------------------- Multi-Disciplinary Care Plan Details Patient Name: Date of Service: NO RMA [name] 11/07/2022 9:00 [place] Record Number: [id number] Patient Account Number: [id number] Date of Birth/Sex: Treating RN: [birth year] (72 y.o. [name] Primary Care Missy Baksh: [name] Other Clinician: Massie Kaufman Referring Raistlin Gum: Treating Tanikka Bresnan/Extender: [name] in Treatment: 2 Active Inactive Necrotic Tissue Nursing Diagnoses: Impaired tissue integrity related to necrotic/devitalized tissue Knowledge deficit related to management of necrotic/devitalized tissue Goals: Necrotic/devitalized tissue will be minimized in the wound bed Date Initiated: 10/20/2022 Target Resolution Date: 11/20/2022 Goal Status: Active Patient/caregiver will verbalize understanding of reason and process for debridement of necrotic tissue Date Initiated: 10/20/2022 Target  Resolution Date: 11/20/2022 Goal Status: Active Interventions: Assess patient pain level pre-, during and post procedure and prior to discharge Provide education on necrotic tissue and debridement process Treatment Activities: Apply topical anesthetic as ordered : 10/18/2022 Notes: Orientation to the Wound Care Program Nursing Diagnoses: Knowledge deficit related to the wound healing center program Goals: Patient/caregiver will verbalize understanding of the [facility] Date Initiated: 10/20/2022 Target Resolution Date: 11/17/2022 Goal Status: Active Interventions: Provide education on orientation to the wound center Notes: Electronic Signature(s) Signed: 11/08/2022 8:02:39 AM By: Gretta Cool, BSN, RN, CWS, [name] RN, BSN Signed: 11/08/2022 5:18:06 PM By: [name] Entered By: [name] on 11/07/2022 10:16:17 [name] ([id number]) [id number] Page 6 of 10 -------------------------------------------------------------------------------- Pain Assessment Details Patient Name: Date of Service: NO RMA [name] 11/07/2022 9:00 [place] Record Number: [id number] Patient Account Number: [id number] Date of Birth/Sex: Treating RN: 07/01/1951 (72 y.o. [name] Primary Care Marin Milley: [name] Other Clinician: Massie Kaufman Referring Devine Klingel: Treating Coran Dipaola/Extender: [name] in Treatment: 2 Active Problems Location of Pain Severity and Description of Pain Patient Has Paino No Site Locations Pain Management and Medication Current Pain Management: Electronic Signature(s) Signed: 11/07/2022 9:39:47 AM By: [name] Signed: 11/08/2022 8:02:39 AM By: Gretta Cool, BSN, RN, CWS, [name] RN, BSN Entered By: [name] on 11/07/2022 09:14:58 -------------------------------------------------------------------------------- Patient/Caregiver Education Details Patient Name: Date of Service: NO RMA [name] 3/19/2024andnbsp9:00 [place] Record Number: [id number] Patient Account Number: [id number] Date of Birth/Gender: Treating RN: Feb 26, 1951 (72 y.o. [name] Primary Care Physician: [name] Other Clinician: Massie Kaufman Referring Physician: Treating Physician/Extender: [name] in Treatment: 2 [name], [name] ([id number]) [id number] Page 7 of 10 Education Assessment Education Provided To: Patient Education Topics Provided Wound/Skin Impairment: Handouts: Other: continue wound care as directed Methods: Explain/Verbal Responses: State content correctly Electronic Signature(s) Signed: 11/08/2022 5:18:06 PM By: [name] Entered By: [name] on 11/07/2022 10:16:34 -------------------------------------------------------------------------------- Wound Assessment Details Patient Name: Date of Service: NO RMA [name] 11/07/2022 9:00 [place] Record Number: [id number] Patient Account Number: [id number] Date of Birth/Sex: Treating RN: 06-15-1951 (72 y.o. [name], [name] Primary Care Bonnee Zertuche: [name] Other Clinician: Massie Kaufman Referring Atley Neubert: Treating Stepheny Canal/Extender: [name] in Treatment: 2 Wound Status Wound Number: 1 Primary Etiology: Atypical Wound Location: Left, Medial Ankle Wound Status: Open Wounding Event: Surgical Injury Comorbid History: Hypertension, Neuropathy Date Acquired: 09/23/2022 Weeks Of Treatment: 2 Clustered Wound: No Photos Wound Measurements Length: (cm) 1.5 Width: (cm) 1.2 Depth: (cm) 0.2 Area: (cm) 1.414 Volume: (cm) 0.283 % Reduction in Area: 43.7% % Reduction in Volume: 43.7%  Epithelialization: None Wound Description Classification: Full Thickness Without Exposed Support Structures Exudate Amount: Medium Exudate Type: Serosanguineous [name], [name] ([id number]) Exudate Color: red, brown Foul Odor After  Cleansing: No Slough/Fibrino Yes [phone].pdf Page 8 of 10 Wound Bed Granulation Amount: None Present (0%) Exposed Structure Necrotic Amount: Large (67-100%) Fascia Exposed: No Necrotic Quality: Adherent Slough Fat Layer (Subcutaneous Tissue) Exposed: Yes Tendon Exposed: No Muscle Exposed: No Joint Exposed: No Bone Exposed: No Treatment Notes Wound #1 (Ankle) Wound Laterality: Left, Medial Cleanser Vashe 5.8 (oz) Discharge Instruction: Use vashe 5.8 (oz) as directed Peri-Wound Care Topical Primary Dressing Prisma 4.34 (in) Discharge Instruction: Moisten w/normal saline or sterile water; Cover wound as directed. Do not remove from wound bed. Secondary Dressing (BORDER) Zetuvit Plus SILICONE BORDER Dressing 4x4 (in/in) Discharge Instruction: Please do not put silicone bordered dressings under wraps. Use non-bordered dressing only. Secured With Tubigrip Size C, 2.75x10 (in/yd) Discharge Instruction: Apply 3 Tubigrip C 3-finger-widths below knee to base of toes to secure dressing and/or for swelling. Compression Wrap Compression Stockings Add-Ons Electronic Signature(s) Signed: 11/07/2022 9:39:47 AM By: [name] Signed: 11/08/2022 8:02:39 AM By: Gretta Cool, BSN, RN, CWS, [name] RN, BSN Entered By: [name] on 11/07/2022 09:24:37 -------------------------------------------------------------------------------- Wound Assessment Details Patient Name: Date of Service: NO RMA [name] 11/07/2022 9:00 A M Medical Record Number: [id number] Patient Account Number: [id number] Date of Birth/Sex: Treating RN: 1950-11-03 (72 y.o. [name] Primary Care Shanine Kreiger: [name] Other Clinician: Massie Kaufman Referring Lula Kolton: Treating Kazia Grisanti/Extender: [name] Weeks in Treatment: 2 Wound Status Wound Number: 2 Primary Etiology: Skin Tear Wound Location: Right, Lateral Lower Leg Wound Status: Healed - Epithelialized Wounding  Event: Skin Tear/Laceration Comorbid History: Hypertension, Neuropathy Date Acquired: 10/14/2022 Weeks Of Treatment: 2 Clustered Wound: Yes [name], [name] ([id number]) [id number] Page 9 of 10 Photos Wound Measurements Length: (cm) Width: (cm) Depth: (cm) Area: (cm) Volume: (cm) 0 % Reduction in Area: 100% 0 % Reduction in Volume: 100% 0 Epithelialization: Large (67-100%) 0 0 Wound Description Classification: Full Thickness Without Exposed Support Exudate Amount: None Present Structures Foul Odor After Cleansing: No Slough/Fibrino No Wound Bed Granulation Amount: None Present (0%) Exposed Structure Necrotic Amount: None Present (0%) Fascia Exposed: No Fat Layer (Subcutaneous Tissue) Exposed: No Tendon Exposed: No Muscle Exposed: No Joint Exposed: No Bone Exposed: No Treatment Notes Wound #2 (Lower Leg) Wound Laterality: Right, Lateral Cleanser Peri-Wound Care Topical Primary Dressing Secondary Dressing Secured With Compression Wrap Compression Stockings Add-Ons Electronic Signature(s) Signed: 11/08/2022 8:02:39 AM By: Gretta Cool, BSN, RN, CWS, [name] RN, BSN Signed: 11/08/2022 5:18:06 PM By: [name] Previous Signature: 11/07/2022 9:39:47 AM Version By: [name] Entered By: [name] on 11/07/2022 09:48:31 [name] ([id number]) [id number] Page 10 of 10 -------------------------------------------------------------------------------- Vitals Details Patient Name: Date of Service: NO RMA [name] 11/07/2022 9:00 [place] Record Number: [id number] Patient Account Number: [id number] Date of Birth/Sex: Treating RN: [birth year] (72 y.o. [name], [name] Primary Care Julia Alkhatib: [name] Other Clinician: Massie Kaufman Referring Bev Drennen: Treating Jrue Yambao/Extender: [name] in Treatment: 2 Vital Signs Time Taken: 09:11 Temperature (F): 97.5 Height (in):  61 Pulse (bpm): 66 Weight (lbs): 160 Respiratory Rate (breaths/min): 16 Body Mass Index (BMI): 30.2 Blood Pressure (mmHg): 134/65 Reference Range: 80 - 120 mg / dl Electronic Signature(s) Signed: 11/07/2022 9:39:47 AM By: [name] Entered By: [name] on 11/07/2022 09:14:52

## 2022-11-13 DIAGNOSIS — M545 Low back pain, unspecified: Secondary | ICD-10-CM | POA: Diagnosis not present

## 2022-11-13 DIAGNOSIS — M6281 Muscle weakness (generalized): Secondary | ICD-10-CM | POA: Diagnosis not present

## 2022-11-13 DIAGNOSIS — M25511 Pain in right shoulder: Secondary | ICD-10-CM | POA: Diagnosis not present

## 2022-11-13 DIAGNOSIS — R293 Abnormal posture: Secondary | ICD-10-CM | POA: Diagnosis not present

## 2022-11-14 ENCOUNTER — Encounter: Payer: Medicare Other | Admitting: Physician Assistant

## 2022-11-14 DIAGNOSIS — T8131XA Disruption of external operation (surgical) wound, not elsewhere classified, initial encounter: Secondary | ICD-10-CM | POA: Diagnosis not present

## 2022-11-14 DIAGNOSIS — M313 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: Secondary | ICD-10-CM | POA: Diagnosis not present

## 2022-11-14 DIAGNOSIS — L97822 Non-pressure chronic ulcer of other part of left lower leg with fat layer exposed: Secondary | ICD-10-CM | POA: Diagnosis not present

## 2022-11-14 DIAGNOSIS — I872 Venous insufficiency (chronic) (peripheral): Secondary | ICD-10-CM | POA: Diagnosis not present

## 2022-11-14 DIAGNOSIS — G8929 Other chronic pain: Secondary | ICD-10-CM | POA: Diagnosis not present

## 2022-11-14 DIAGNOSIS — L97322 Non-pressure chronic ulcer of left ankle with fat layer exposed: Secondary | ICD-10-CM | POA: Diagnosis not present

## 2022-11-14 DIAGNOSIS — I10 Essential (primary) hypertension: Secondary | ICD-10-CM | POA: Diagnosis not present

## 2022-11-14 NOTE — Progress Notes (Signed)
NEFELI, BIRT Metcalf ([id number]) [id number] Page 1 of 6 Visit Report for 11/14/2022 Chief Complaint Document Details Patient Name: Date of Service: NO RMA [name] 11/14/2022 8:15 A M Medical Record Number: [id number] Patient Account Number: [id number] Date of Birth/Sex: Treating RN: 01-29-1951 (72 y.o. [name] Primary Care Provider: Crist Infante Other Clinician: Massie Kluver Referring Provider: Treating Provider/Extender: [name] in Treatment: 3 Information Obtained from: Patient Chief Complaint 10/18/2022; left lower extremity wound status post [facility] removal with mohs surgery, right lower extremity skin tear from fall Electronic Signature(s) Signed: 11/14/2022 8:27:00 AM By: Worthy Keeler PA-C Entered By: Worthy Keeler on 11/14/2022 08:26:59 -------------------------------------------------------------------------------- Debridement Details Patient Name: Date of Service: NO RMA [name] 11/14/2022 8:15 A M Medical Record Number: [id number] Patient Account Number: [id number] Date of Birth/Sex: Treating RN: July 15, 1951 (72 y.o. [name], [name] Primary Care Provider: Crist Infante Other Clinician: Massie Kluver Referring Provider: Treating Provider/Extender: [name] in Treatment: 3 Debridement Performed for Assessment: Wound #1 Left,Medial Ankle Performed By: Physician Joyce Sams., PA-C Debridement Type: Debridement Level of Consciousness (Pre-procedure): Awake and Alert Pre-procedure Verification/Time Out Yes - 09:15 Taken: Start Time: 09:15 T Area Debrided (L x W): otal 1.8 (cm) x 1.4 (cm) = 2.52 (cm) Tissue and other material debrided: Viable, Non-Viable, Slough, Subcutaneous, Biofilm, Slough Level: Skin/Subcutaneous Tissue Debridement Description: Excisional Instrument: Curette Bleeding: Minimum Hemostasis Achieved: Pressure Response to Treatment: Procedure was tolerated  well Level of Consciousness (Post- Awake and Alert procedure): Post Debridement Measurements of Total Wound Length: (cm) 1.8 Width: (cm) 1.4 Depth: (cm) 0.2 Volume: (cm) 0.396 Character of Wound/Ulcer Post Debridement: Stable Post Procedure Diagnosis Same as Pre-procedure Electronic Signature(s) Signed: 11/14/2022 9:38:59 AM By: Worthy Keeler PA-C Signed: 11/15/2022 2:54:42 PM By: Gretta Cool, BSN, RN, CWS, [name] RN, BSN Entered By: Worthy Keeler on 11/14/2022 09:38:59 HPI Details -------------------------------------------------------------------------------- [name] ([id number]) [id number] Page 2 of 6 Patient Name: Date of Service: NO RMA [name] 11/14/2022 8:15 A M Medical Record Number: [id number] Patient Account Number: [id number] Date of Birth/Sex: Treating RN: 04-23-1951 (72 y.o. [name] Primary Care Provider: Crist Infante Other Clinician: Massie Kluver Referring Provider: Treating Provider/Extender: [name] in Treatment: 3 History of Present Illness HPI Description: 10/18/2022 [name] is a 72 year old female with a past medical history of A-fib on Eliquis, Wegener's granulomatosis with vasculitis, and basal cell carcinoma of the left leg that presents the clinic for a wound to the left leg status post Mohs procedure for basal cell carcinoma removal and skin tear to the right lower extremity that occurred 3 days ago after a fall. She has been using Vaseline to the wound beds. She has compression stockings and has been using these. She currently has chronic pain to the left leg wound site with slight erythema and warmth to the periwound. 3/6; patient presents for follow-up. She has been using Medihoney and Hydrofera Blue to the left lower extremity wound and Xeroform to the right lower extremity wound. She denies signs of infection. She completed her oral antibiotics. 11-07-2022 upon  evaluation today patient appears to be doing well currently in regard to her wound. This actually did require some sharp debridement to clearway the necrotic debris. She actually tolerated that debridement today well. 11-14-2022 upon evaluation today patient appears to be doing well currently in regard to her wound. There is some slough and biofilm noted on the surface  of the wound currently but fortunately nothing that appears to be too significant. I do think she is going to require some sharp debridement today. Fortunately I do not see any evidence of infection locally or systemically at this point. Electronic Signature(s) Signed: 11/14/2022 9:34:49 AM By: Worthy Keeler PA-C Entered By: Worthy Keeler on 11/14/2022 09:34:49 -------------------------------------------------------------------------------- Physical Exam Details Patient Name: Date of Service: NO RMA [name] 11/14/2022 8:15 A M Medical Record Number: [id number] Patient Account Number: [id number] Date of Birth/Sex: Treating RN: Jul 22, 1951 (72 y.o. [name] Primary Care Provider: Crist Infante Other Clinician: Massie Kluver Referring Provider: Treating Provider/Extender: [name] in Treatment: 3 Constitutional Well-nourished and well-hydrated in no acute distress. Respiratory normal breathing without difficulty. Psychiatric this patient is able to make decisions and demonstrates good insight into disease process. Alert and Oriented x 3. pleasant and cooperative. Notes Upon inspection patient's wound bed actually showed signs of good granulation and epithelization at this point. Fortunately I do not see any signs again of anything worsening and in general I think this looks better than it did last week I do believe the switch to the Prisma has done well for her. Electronic Signature(s) Signed: 11/14/2022 9:35:07 AM By: Worthy Keeler PA-C Entered By: Worthy Keeler on 11/14/2022  09:35:07 -------------------------------------------------------------------------------- Physician Orders Details Patient Name: Date of Service: NO RMA [name] 11/14/2022 8:15 A M Medical Record Number: [id number] Patient Account Number: [id number] Date of Birth/Sex: Treating RN: 28-Apr-1951 (72 y.o. [name] Primary Care Provider: Crist Infante Other Clinician: Massie Kluver Referring Provider: Treating Provider/Extender: [name] in Treatment: 3 Verbal / Phone Orders: Yes Clinician: Cornell Kaufman Read Back and Verified: Yes Diagnosis Coding ICD-10 Coding SIARRAH, BARTNIK ([id number]) [id number] Page 3 of 6 Code Description T81.31XA Disruption of external operation (surgical) wound, not elsewhere classified, initial encounter L97.322 Non-pressure chronic ulcer of left ankle with fat layer exposed C44.91 Basal cell carcinoma of skin, unspecified S81.811A Laceration without foreign body, right lower leg, initial encounter I48.20 Chronic atrial fibrillation, unspecified Z79.01 Long term (current) use of anticoagulants Follow-up Appointments Return Appointment in 1 week. Nurse Visit as needed Bathing/ Shower/ Hygiene Wound #1 Left,Medial Ankle Wash wounds with antibacterial soap and water. May shower; gently cleanse wound with antibacterial soap, rinse and pat dry prior to dressing wounds No tub bath. Anesthetic (Use 'Patient Medications' Section for Anesthetic Order Entry) Lidocaine applied to wound bed Edema Control - Lymphedema / Segmental Compressive Device / Other Wound #1 Left,Medial Ankle Patient to wear own compression stockings. Remove compression stockings every night before going to bed and put on every morning when getting up. - Bilat lower legs Elevate, Exercise Daily and A void Standing for Long Periods of Time. Wound Treatment Wound #1 - Ankle Wound Laterality: Left, Medial Cleanser: Vashe 5.8 (oz) 3  x Per Week/30 Days Discharge Instructions: Use vashe 5.8 (oz) as directed Prim Dressing: Prisma 4.34 (in) (Dispense As Written) 3 x Per Week/30 Days ary Discharge Instructions: Moisten w/normal saline or sterile water; Cover wound as directed. Do not remove from wound bed. Secondary Dressing: (BORDER) Zetuvit Plus SILICONE BORDER Dressing 4x4 (in/in) (Dispense As Written) 3 x Per Week/30 Days Discharge Instructions: Please do not put silicone bordered dressings under wraps. Use non-bordered dressing only. Secured With: Tubigrip Size C, 2.75x10 (in/yd) 3 x Per Week/30 Days Discharge Instructions: Apply 3 Tubigrip C 3-finger-widths below knee to base of toes to secure dressing and/or  for swelling. Electronic Signature(s) Signed: 11/14/2022 6:13:56 PM By: Worthy Keeler PA-C Signed: 11/16/2022 9:18:57 AM By: Massie Kluver Entered By: Massie Kluver on 11/14/2022 12:03:17 -------------------------------------------------------------------------------- Problem List Details Patient Name: Date of Service: NO RMA [name] 11/14/2022 8:15 A M Medical Record Number: [id number] Patient Account Number: [id number] Date of Birth/Sex: Treating RN: 1950/10/28 (72 y.o. [name] Primary Care Provider: Crist Infante Other Clinician: Massie Kluver Referring Provider: Treating Provider/Extender: [name] in Treatment: 3 Active Problems ICD-10 Encounter Code Description Active Date MDM Diagnosis T81.31XA Disruption of external operation (surgical) wound, not elsewhere classified, 10/18/2022 No Yes initial encounter L97.322 Non-pressure chronic ulcer of left ankle with fat layer exposed 10/18/2022 No Yes ANDRIEL, LEONHART ([id number]) [id number] Page 4 of 6 C44.91 Basal cell carcinoma of skin, unspecified 10/18/2022 No Yes S81.811A Laceration without foreign body, right lower leg, initial encounter 10/18/2022 No Yes I48.20 Chronic atrial  fibrillation, unspecified 10/18/2022 No Yes Z79.01 Long term (current) use of anticoagulants 10/18/2022 No Yes Inactive Problems Resolved Problems Electronic Signature(s) Signed: 11/14/2022 8:26:51 AM By: Worthy Keeler PA-C Entered By: Worthy Keeler on 11/14/2022 08:26:51 -------------------------------------------------------------------------------- Progress Note Details Patient Name: Date of Service: NO RMA [name] 11/14/2022 8:15 A M Medical Record Number: [id number] Patient Account Number: [id number] Date of Birth/Sex: Treating RN: 1951/04/17 (72 y.o. [name] Primary Care Provider: Crist Infante Other Clinician: Massie Kluver Referring Provider: Treating Provider/Extender: [name] in Treatment: 3 Subjective Chief Complaint Information obtained from Patient 10/18/2022; left lower extremity wound status post [facility] removal with mohs surgery, right lower extremity skin tear from fall History of Present Illness (HPI) 10/18/2022 [name] is a 71 year old female with a past medical history of A-fib on Eliquis, Wegener's granulomatosis with vasculitis, and basal cell carcinoma of the left leg that presents the clinic for a wound to the left leg status post Mohs procedure for basal cell carcinoma removal and skin tear to the right lower extremity that occurred 3 days ago after a fall. She has been using Vaseline to the wound beds. She has compression stockings and has been using these. She currently has chronic pain to the left leg wound site with slight erythema and warmth to the periwound. 3/6; patient presents for follow-up. She has been using Medihoney and Hydrofera Blue to the left lower extremity wound and Xeroform to the right lower extremity wound. She denies signs of infection. She completed her oral antibiotics. 11-07-2022 upon evaluation today patient appears to be doing well currently in regard to her wound. This actually did  require some sharp debridement to clearway the necrotic debris. She actually tolerated that debridement today well. 11-14-2022 upon evaluation today patient appears to be doing well currently in regard to her wound. There is some slough and biofilm noted on the surface of the wound currently but fortunately nothing that appears to be too significant. I do think she is going to require some sharp debridement today. Fortunately I do not see any evidence of infection locally or systemically at this point. Objective Constitutional Well-nourished and well-hydrated in no acute distress. Vitals Time Taken: 8:23 AM, Height: 61 in, Weight: 160 lbs, BMI: 30.2, Temperature: 98.1 F, Pulse: 80 bpm, Respiratory Rate: 16 breaths/min, Blood Pressure: 123/71 mmHg. Respiratory normal breathing without difficulty. LORISSA, GUADERRAMA [name] ([id number]) [id number] Page 5 of 6 Psychiatric this patient is able to make decisions and demonstrates good insight into disease process. Alert and Oriented x  3. pleasant and cooperative. General Notes: Upon inspection patient's wound bed actually showed signs of good granulation and epithelization at this point. Fortunately I do not see any signs again of anything worsening and in general I think this looks better than it did last week I do believe the switch to the Prisma has done well for her. Integumentary (Hair, Skin) Wound #1 status is Open. Original cause of wound was Surgical Injury. The date acquired was: 09/23/2022. The wound has been in treatment 3 weeks. The wound is located on the Left,Medial Ankle. The wound measures 1.8cm length x 1.4cm width x 0.1cm depth; 1.979cm^2 area and 0.198cm^3 volume. There is Fat Layer (Subcutaneous Tissue) exposed. There is a medium amount of serosanguineous drainage noted. There is no granulation within the wound bed. There is a large (67-100%) amount of necrotic tissue within the wound bed including Adherent  Slough. Assessment Active Problems ICD-10 Disruption of external operation (surgical) wound, not elsewhere classified, initial encounter Non-pressure chronic ulcer of left ankle with fat layer exposed Basal cell carcinoma of skin, unspecified Laceration without foreign body, right lower leg, initial encounter Chronic atrial fibrillation, unspecified Long term (current) use of anticoagulants Procedures Wound #1 Pre-procedure diagnosis of Wound #1 is an Atypical located on the Left,Medial Ankle . There was a Excisional Skin/Subcutaneous Tissue Debridement with a total area of 2.52 sq cm performed by Joyce Sams., PA-C. With the following instrument(s): Curette to remove Viable and Non-Viable tissue/material. Material removed includes Subcutaneous Tissue, Slough, and Biofilm. A time out was conducted at 09:15, prior to the start of the procedure. A Minimum amount of bleeding was controlled with Pressure. The procedure was tolerated well. Post Debridement Measurements: 1.8cm length x 1.4cm width x 0.2cm depth; 0.396cm^3 volume. Character of Wound/Ulcer Post Debridement is stable. Post procedure Diagnosis Wound #1: Same as Pre-Procedure Plan Follow-up Appointments: Return Appointment in 1 week. Nurse Visit as needed Bathing/ Shower/ Hygiene: Wound #1 Left,Medial Ankle: Wash wounds with antibacterial soap and water. May shower; gently cleanse wound with antibacterial soap, rinse and pat dry prior to dressing wounds No tub bath. Anesthetic (Use 'Patient Medications' Section for Anesthetic Order Entry): Lidocaine applied to wound bed Edema Control - Lymphedema / Segmental Compressive Device / Other: Wound #1 Left,Medial Ankle: Patient to wear own compression stockings. Remove compression stockings every night before going to bed and put on every morning when getting up. - Bilat lower legs Elevate, Exercise Daily and Avoid Standing for Long Periods of Time. WOUND #1: - Ankle Wound  Laterality: Left, Medial Cleanser: Vashe 5.8 (oz) 3 x Per Week/30 Days Discharge Instructions: Use vashe 5.8 (oz) as directed Prim Dressing: Prisma 4.34 (in) (Dispense As Written) 3 x Per Week/30 Days ary Discharge Instructions: Moisten w/normal saline or sterile water; Cover wound as directed. Do not remove from wound bed. Secondary Dressing: (BORDER) Zetuvit Plus SILICONE BORDER Dressing 4x4 (in/in) (Dispense As Written) 3 x Per Week/30 Days Discharge Instructions: Please do not put silicone bordered dressings under wraps. Use non-bordered dressing only. Secured With: Tubigrip Size C, 2.75x10 (in/yd) 3 x Per Week/30 Days Discharge Instructions: Apply 3 Tubigrip C 3-finger-widths below knee to base of toes to secure dressing and/or for swelling. 1. I am good recommend that we continue with the Prisma as that seems to be doing excellent. 2. I am also going to suggest the patient should continue to elevate her leg is much as possible she does not have too much swelling but overall I think she is doing quite  well. We are using Tubigrip which is good to help with some of the edema as well. 3. I am also going to suggest the patient should continue to monitor for any signs of infection or worsening if anything changes she should let me know. We will see patient back for reevaluation in 1 week here in the clinic. If anything worsens or changes patient will contact our office for additional recommendations. KEARSTIN, HINDES [place] ([id number]) [id number] Page 6 of 6 Electronic Signature(s) Signed: 11/14/2022 9:39:18 AM By: Worthy Keeler PA-C Previous Signature: 11/14/2022 9:35:35 AM Version By: Worthy Keeler PA-C Entered By: Worthy Keeler on 11/14/2022 09:39:17 -------------------------------------------------------------------------------- SuperBill Details Patient Name: Date of Service: NO RMA [name] 11/14/2022 Medical Record Number: [id number] Patient  Account Number: [id number] Date of Birth/Sex: Treating RN: 31-Jan-1951 (72 y.o. [name] Primary Care Provider: Crist Infante Other Clinician: Massie Kluver Referring Provider: Treating Provider/Extender: [name] in Treatment: 3 Diagnosis Coding ICD-10 Codes Code Description T81.31XA Disruption of external operation (surgical) wound, not elsewhere classified, initial encounter L97.322 Non-pressure chronic ulcer of left ankle with fat layer exposed C44.91 Basal cell carcinoma of skin, unspecified S81.811A Laceration without foreign body, right lower leg, initial encounter I48.20 Chronic atrial fibrillation, unspecified Z79.01 Long term (current) use of anticoagulants Facility Procedures : CPT4 Code: [id number] Description: 11042 - DEB SUBQ TISSUE 20 SQ CM/< ICD-10 Diagnosis Description L97.322 Non-pressure chronic ulcer of left ankle with fat layer exposed Modifier: Quantity: 1 Physician Procedures : CPT4 Code Description Modifier [id number] 11042 - WC PHYS SUBQ TISS 20 SQ CM ICD-10 Diagnosis Description L97.322 Non-pressure chronic ulcer of left ankle with fat layer exposed Quantity: 1 Electronic Signature(s) Signed: 11/14/2022 9:39:27 AM By: Worthy Keeler PA-C Entered By: Worthy Keeler on 11/14/2022 09:39:27

## 2022-11-16 NOTE — Progress Notes (Signed)
Joyce, Kaufman [place] ([id number]) [id number] Page 1 of 7 Visit Report for 11/14/2022 Arrival Information Details Patient Name: Date of Service: NO RMA [name] [name] 11/14/2022 8:15 A M Medical Record Number: [id number] Patient Account Number: [id number] Date of Birth/Sex: Treating RN: 09-15-1950 (72 y.o. [name] Primary Care Meili Kleckley: Crist Infante Other Clinician: Massie Kluver Referring Merida Alcantar: Treating Kmarion Rawl/Extender: Marta Lamas in Treatment: 3 Visit Information History Since Last Visit All ordered tests and consults were completed: No Patient Arrived: Ambulatory Added or deleted any medications: No Arrival Time: 08:19 Any new allergies or adverse reactions: No Transfer Assistance: None Had a fall or experienced change in No Patient Identification Verified: Yes activities of daily living that may affect Secondary Verification Process Completed: Yes risk of falls: Patient Requires Transmission-Based Precautions: No Signs or symptoms of abuse/neglect since last visito No Patient Has Alerts: Yes Hospitalized since last visit: No Patient Alerts: Patient on Blood Thinner Implantable device outside of the clinic excluding No Eliquis//ASA cellular tissue based products placed in the center Not Diabetic since last visit: Has Dressing in Place as Prescribed: Yes Has Compression in Place as Prescribed: Yes Pain Present Now: No Electronic Signature(s) Signed: 11/16/2022 9:18:57 AM By: Massie Kluver Entered By: Massie Kluver on 11/14/2022 08:23:15 -------------------------------------------------------------------------------- Clinic Level of Care Assessment Details Patient Name: Date of Service: NO RMA [name] [name] 11/14/2022 8:15 A M Medical Record Number: [id number] Patient Account Number: [id number] Date of Birth/Sex: Treating RN: [birth year] (72 y.o. [name] Primary Care Russie Gulledge: Crist Infante Other  Clinician: Massie Kluver Referring Kiara Keep: Treating Ivann Trimarco/Extender: Marta Lamas in Treatment: 3 Clinic Level of Care Assessment Items TOOL 1 Quantity Score []  - 0 Use when EandM and Procedure is performed on INITIAL visit ASSESSMENTS - Nursing Assessment / Reassessment []  - 0 General Physical Exam (combine w/ comprehensive assessment (listed just below) when performed on new pt. evals) []  - 0 Comprehensive Assessment (HX, ROS, Risk Assessments, Wounds Hx, etc.) ASSESSMENTS - Wound and Skin Assessment / Reassessment []  - 0 Dermatologic / Skin Assessment (not related to wound area) ASSESSMENTS - Ostomy and/or Continence Assessment and Care []  - 0 Incontinence Assessment and Management []  - 0 Ostomy Care Assessment and Management (repouching, etc.) PROCESS - Coordination of Care []  - 0 Simple Patient / Family Education for ongoing care []  - 0 Complex (extensive) Patient / Family Education for ongoing care []  - 0 Staff obtains Consents, Records, T Results / Process Orders est []  - 0 Staff telephones HHA, Nursing Homes / Clarify orders / etc []  - 0 Routine Transfer to another Facility (non-emergent condition) []  - 0 Routine Hospital Admission (non-emergent condition) CESIA, SCHEMENAUER ([id number]) [id number] Page 2 of 7 []  - 0 New Admissions / Biomedical engineer / Ordering NPWT Apligraf, etc. , []  - 0 Emergency Hospital Admission (emergent condition) PROCESS - Special Needs []  - 0 Pediatric / Minor Patient Management []  - 0 Isolation Patient Management []  - 0 Hearing / Language / Visual special needs []  - 0 Assessment of Community assistance (transportation, D/C planning, etc.) []  - 0 Additional assistance / Altered mentation []  - 0 Support Surface(s) Assessment (bed, cushion, seat, etc.) INTERVENTIONS - Miscellaneous []  - 0 External ear exam []  - 0 Patient Transfer (multiple staff / Civil Service fast streamer / Similar  devices) []  - 0 Simple Staple / Suture removal (25 or less) []  - 0 Complex Staple / Suture removal (26 or more) []  - 0 Hypo/Hyperglycemic Management (do not check  if billed separately) []  - 0 Ankle / Brachial Index (ABI) - do not check if billed separately Has the patient been seen at the hospital within the last three years: Yes Total Score: 0 Level Of Care: ____ Electronic Signature(s) Signed: 11/16/2022 9:18:57 AM By: Massie Kluver Entered By: Massie Kluver on 11/14/2022 09:17:47 -------------------------------------------------------------------------------- Encounter Discharge Information Details Patient Name: Date of Service: NO RMA [name] [name] 11/14/2022 8:15 A M Medical Record Number: [id number] Patient Account Number: [id number] Date of Birth/Sex: Treating RN: [birth year] (72 y.o. [name] Primary Care [name]: Crist Infante Other Clinician: Massie Kluver Referring [name]: Treating [name]/Extender: Marta Lamas in Treatment: 3 Encounter Discharge Information Items Post Procedure Vitals Discharge Condition: Stable Temperature (F): 98.1 Ambulatory Status: Ambulatory Pulse (bpm): 80 Discharge Destination: Home Respiratory Rate (breaths/min): 18 Transportation: Private Auto Blood Pressure (mmHg): 123/72 Accompanied By: self Schedule Follow-up Appointment: Yes Clinical Summary of Care: Electronic Signature(s) Signed: 11/16/2022 9:18:57 AM By: Massie Kluver Entered By: Massie Kluver on 11/14/2022 12:04:09 -------------------------------------------------------------------------------- Lower Extremity Assessment Details Patient Name: Date of Service: NO RMA [name] [name] 11/14/2022 8:15 A M Medical Record Number: [id number] Patient Account Number: [id number] Date of Birth/Sex: Treating RN: [birth year] (72 y.o. [name] Primary Care [name]: Crist Infante Other Clinician: Massie Kluver Referring [name]: Treating  [name]/Extender: Marta Lamas in Treatment: 3 Edema Assessment N[Left: MONTOYA, PRUDENT [id number] Patrice [id number] Page 3 of 7] Assessed: [Left: Yes] [Right: No] Edema: [Left: Ye] [Right: s] Calf Left: Right: Point of Measurement: 33 cm From Medial Instep 34 cm Ankle Left: Right: Point of Measurement: 12 cm From Medial Instep 22 cm Vascular Assessment Pulses: Dorsalis Pedis Palpable: [Left:Yes] Electronic Signature(s) Signed: 11/15/2022 2:54:42 PM By: Gretta Cool, BSN, RN, CWS, [name] RN, BSN Signed: 11/16/2022 9:18:57 AM By: Massie Kluver Entered By: Massie Kluver on 11/14/2022 08:31:42 -------------------------------------------------------------------------------- Multi Wound Chart Details Patient Name: Date of Service: NO RMA [name] [name] 11/14/2022 8:15 A M Medical Record Number: [id number] Patient Account Number: [id number] Date of Birth/Sex: Treating RN: 07-14-1951 (72 y.o. [name] Primary Care Toneshia Coello: Crist Infante Other Clinician: Massie Kluver Referring Esmeralda Malay: Treating Ahaana Rochette/Extender: Marta Lamas in Treatment: 3 Vital Signs Height(in): 61 Pulse(bpm): 80 Weight(lbs): 160 Blood Pressure(mmHg): 123/71 Body Mass Index(BMI): 30.2 Temperature(F): 98.1 Respiratory Rate(breaths/min): 16 [1:Photos:] [N/A:N/A] Left, Medial Ankle N/A N/A Wound Location: Surgical Injury N/A N/A Wounding Event: Atypical N/A N/A Primary Etiology: Hypertension, Neuropathy N/A N/A Comorbid History: 09/23/2022 N/A N/A Date Acquired: 3 N/A N/A Weeks of Treatment: Open N/A N/A Wound Status: No N/A N/A Wound Recurrence: 1.8x1.4x0.1 N/A N/A Measurements L x W x D (cm) 1.979 N/A N/A A (cm) : rea 0.198 N/A N/A Volume (cm) : 21.20% N/A N/A % Reduction in A rea: 60.60% N/A N/A % Reduction in Volume: Full Thickness Without Exposed N/A N/A Classification: Support Structures Medium N/A N/A Exudate  Amount: Serosanguineous N/A N/A Exudate Type: red, brown N/A N/A Exudate Color: None Present (0%) N/A N/A Granulation Amount: Large (67-100%) N/A N/A Necrotic Amount: Fat Layer (Subcutaneous Tissue): Yes N/A N/A Exposed Structures: Fascia: No Tendon: No LYNEAH, V[place]DEBOGART ([id number]) [phone].pdf Page 4 of 7 Muscle: No Joint: No Bone: No None N/A N/A Epithelialization: Treatment Notes Electronic Signature(s) Signed: 11/16/2022 9:18:57 AM By: Massie Kluver Entered By: Massie Kluver on 11/14/2022 08:31:51 -------------------------------------------------------------------------------- Multi-Disciplinary Care Plan Details Patient Name: Date of Service: NO RMA [name] [name] 11/14/2022 8:15 A M Medical Record Number: [id number] Patient Account  Number: [id number] Date of Birth/Sex: Treating RN: September 20, 1950 (72 y.o. [name] Primary Care Elsa Ploch: Crist Infante Other Clinician: Massie Kluver Referring Tyshawn Keel: Treating Lilymae Swiech/Extender: Marta Lamas in Treatment: 3 Active Inactive Necrotic Tissue Nursing Diagnoses: Impaired tissue integrity related to necrotic/devitalized tissue Knowledge deficit related to management of necrotic/devitalized tissue Goals: Necrotic/devitalized tissue will be minimized in the wound bed Date Initiated: 10/20/2022 Target Resolution Date: 11/20/2022 Goal Status: Active Patient/caregiver will verbalize understanding of reason and process for debridement of necrotic tissue Date Initiated: 10/20/2022 Target Resolution Date: 11/20/2022 Goal Status: Active Interventions: Assess patient pain level pre-, during and post procedure and prior to discharge Provide education on necrotic tissue and debridement process Treatment Activities: Apply topical anesthetic as ordered : 10/18/2022 Notes: Orientation to the Wound Care Program Nursing Diagnoses: Knowledge deficit related to the wound healing center  program Goals: Patient/caregiver will verbalize understanding of the [place] Date Initiated: 10/20/2022 Target Resolution Date: 11/17/2022 Goal Status: Active Interventions: Provide education on orientation to the wound center Notes: Electronic Signature(s) Signed: 11/15/2022 2:54:42 PM By: Gretta Cool, BSN, RN, CWS, [name] RN, BSN Signed: 11/16/2022 9:18:57 AM By: Massie Kluver Entered By: Massie Kluver on 11/14/2022 09:17:56 Pain Assessment Details -------------------------------------------------------------------------------- [name] ([id number]) [id number] Page 5 of 7 Patient Name: Date of Service: NO RMA [name] [name] 11/14/2022 8:15 A M Medical Record Number: [id number] Patient Account Number: [id number] Date of Birth/Sex: Treating RN: 12/09/1950 (72 y.o. [name] Primary Care Loralei Radcliffe: Crist Infante Other Clinician: Massie Kluver Referring Archie Atilano: Treating Ole Lafon/Extender: Marta Lamas in Treatment: 3 Active Problems Location of Pain Severity and Description of Pain Patient Has Paino No Site Locations Pain Management and Medication Current Pain Management: Electronic Signature(s) Signed: 11/15/2022 2:54:42 PM By: Gretta Cool, BSN, RN, CWS, [name] RN, BSN Signed: 11/16/2022 9:18:57 AM By: Massie Kluver Entered By: Massie Kluver on 11/14/2022 08:26:26 -------------------------------------------------------------------------------- Patient/Caregiver Education Details Patient Name: Date of Service: NO RMA [name] [name] 3/26/2024andnbsp8:15 A M Medical Record Number: [id number] Patient Account Number: [id number] Date of Birth/Gender: Treating RN: [birth year] (72 y.o. [name] Primary Care Physician: Crist Infante Other Clinician: Massie Kluver Referring Physician: Treating Physician/Extender: Marta Lamas in Treatment: 3 Education Assessment Education Provided  To: Patient Education Topics Provided Wound/Skin Impairment: Handouts: Other: continue wound care as directed Methods: Explain/Verbal Responses: State content correctly Electronic Signature(s) Signed: 11/16/2022 9:18:57 AM By: Massie Kluver Entered By: Massie Kluver on 11/14/2022 09:25:09 Wound Assessment Details -------------------------------------------------------------------------------- [name] ([id number]) [id number] Page 6 of 7 Patient Name: Date of Service: NO RMA [name] [name] 11/14/2022 8:15 A M Medical Record Number: [id number] Patient Account Number: [id number] Date of Birth/Sex: Treating RN: [birth year] (72 y.o. [name], [name] Primary Care Mylah Baynes: Crist Infante Other Clinician: Massie Kluver Referring Josephanthony Tindel: Treating Casha Estupinan/Extender: Marta Lamas in Treatment: 3 Wound Status Wound Number: 1 Primary Etiology: Atypical Wound Location: Left, Medial Ankle Wound Status: Open Wounding Event: Surgical Injury Comorbid History: Hypertension, Neuropathy Date Acquired: 09/23/2022 Weeks Of Treatment: 3 Clustered Wound: No Photos Wound Measurements Length: (cm) 1.8 % Reduction in Area: 21.2% Width: (cm) 1.4 % Reduction in Volume: 60.6% Depth: (cm) 0.1 Epithelialization: None Area: (cm) 1.979 Volume: (cm) 0.198 Wound Description Classification: Full Thickness Without Exposed Support Structures Foul Odor After Cleansing: No Exudate Amount: Medium Slough/Fibrino Yes Exudate Type: Serosanguineous Exudate Color: red, brown Wound Bed Granulation Amount: None Present (0%) Exposed Structure Necrotic Amount: Large (67-100%) Fascia Exposed: No Necrotic  Quality: Adherent Slough Fat Layer (Subcutaneous Tissue) Exposed: Yes Tendon Exposed: No Muscle Exposed: No Joint Exposed: No Bone Exposed: No Treatment Notes Wound #1 (Ankle) Wound Laterality: Left, Medial Cleanser Vashe 5.8 (oz) Discharge  Instruction: Use vashe 5.8 (oz) as directed Peri-Wound Care Topical Primary Dressing Prisma 4.34 (in) Discharge Instruction: Moisten w/normal saline or sterile water; Cover wound as directed. Do not remove from wound bed. Secondary Dressing (BORDER) Zetuvit Plus SILICONE BORDER Dressing 4x4 (in/in) Discharge Instruction: Please do not put silicone bordered dressings under wraps. Use non-bordered dressing only. Secured With Tubigrip Size C, 2.75x10 (in/yd) Discharge Instruction: Apply 3 Tubigrip C 3-finger-widths below knee to base of toes to secure dressing and/or for swelling. XUAN, MILLER [place] ([id number]) [id number] Page 7 of 7 Compression Wrap Compression Stockings Add-Ons Electronic Signature(s) Signed: 11/15/2022 2:54:42 PM By: Gretta Cool, BSN, RN, CWS, [name] RN, BSN Signed: 11/16/2022 9:18:57 AM By: Massie Kluver Entered By: Massie Kluver on 11/14/2022 08:30:21 -------------------------------------------------------------------------------- Vitals Details Patient Name: Date of Service: NO RMA [name] [name] 11/14/2022 8:15 A M Medical Record Number: [id number] Patient Account Number: [id number] Date of Birth/Sex: Treating RN: [birth year] (72 y.o. [name], [name] Primary Care Ilai Hiller: Crist Infante Other Clinician: Massie Kluver Referring Abdou Stocks: Treating Neomi Laidler/Extender: Marta Lamas in Treatment: 3 Vital Signs Time Taken: 08:23 Temperature (F): 98.1 Height (in): 61 Pulse (bpm): 80 Weight (lbs): 160 Respiratory Rate (breaths/min): 16 Body Mass Index (BMI): 30.2 Blood Pressure (mmHg): 123/71 Reference Range: 80 - 120 mg / dl Electronic Signature(s) Signed: 11/16/2022 9:18:57 AM By: Massie Kluver Entered By: Massie Kluver on 11/14/2022 [id number]

## 2022-11-21 ENCOUNTER — Encounter: Payer: Medicare Other | Attending: Physician Assistant | Admitting: Physician Assistant

## 2022-11-21 DIAGNOSIS — Z7901 Long term (current) use of anticoagulants: Secondary | ICD-10-CM | POA: Diagnosis not present

## 2022-11-21 DIAGNOSIS — G8929 Other chronic pain: Secondary | ICD-10-CM | POA: Insufficient documentation

## 2022-11-21 DIAGNOSIS — L97322 Non-pressure chronic ulcer of left ankle with fat layer exposed: Secondary | ICD-10-CM | POA: Diagnosis not present

## 2022-11-21 DIAGNOSIS — I482 Chronic atrial fibrillation, unspecified: Secondary | ICD-10-CM | POA: Diagnosis not present

## 2022-11-21 DIAGNOSIS — Z85828 Personal history of other malignant neoplasm of skin: Secondary | ICD-10-CM | POA: Insufficient documentation

## 2022-11-21 DIAGNOSIS — I10 Essential (primary) hypertension: Secondary | ICD-10-CM | POA: Diagnosis not present

## 2022-11-21 DIAGNOSIS — M313 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: Secondary | ICD-10-CM | POA: Insufficient documentation

## 2022-11-21 NOTE — Progress Notes (Signed)
DODI, WRAGGE [place] ([id number]) [id number] Page 1 of 7 Visit Report for 11/21/2022 Arrival Information Details Patient Name: Date of Service: NO RMA [name] 11/21/2022 8:30 A M Medical Record Number: [id number] Patient Account Number: [id number] Date of Birth/Sex: Treating RN: 25-Apr-1951 (72 y.o. [name] Primary Care [name]: [name] Other Clinician: Referring [name]: Treating [name]/Extender: [name] in Treatment: 4 Visit Information History Since Last Visit Added or deleted any medications: No Patient Arrived: Ambulatory Has Dressing in Place as Prescribed: Yes Arrival Time: 08:34 Pain Present Now: No Accompanied By: self Transfer Assistance: None Patient Identification Verified: Yes Secondary Verification Process Completed: Yes Patient Requires Transmission-Based Precautions: No Patient Has Alerts: Yes Patient Alerts: Patient on Blood Thinner Eliquis//ASA Not Diabetic Electronic Signature(s) Signed: 11/21/2022 4:29:20 PM By: Joyce Loud MSN RN CNS WTA Entered By: [name] on 11/21/2022 08:59:41 -------------------------------------------------------------------------------- Clinic Level of Care Assessment Details Patient Name: Date of Service: NO RMA [name] 11/21/2022 8:30 A M Medical Record Number: [id number] Patient Account Number: [id number] Date of Birth/Sex: Treating RN: Sep 23, 1950 (72 y.o. [name] Primary Care [name]: [name] Other Clinician: Referring [name]: Treating [name]/Extender: [name] in Treatment: 4 Clinic Level of Care Assessment Items TOOL 1 Quantity Score []  - 0 Use when EandM and Procedure is performed on INITIAL visit ASSESSMENTS - Nursing Assessment / Reassessment []  - 0 General Physical Exam (combine w/ comprehensive assessment (listed just below) when performed on new pt. evals) []  - 0 Comprehensive Assessment (HX,  ROS, Risk Assessments, Wounds Hx, etc.) ASSESSMENTS - Wound and Skin Assessment / Reassessment []  - 0 Dermatologic / Skin Assessment (not related to wound area) ASSESSMENTS - Ostomy and/or Continence Assessment and Care []  - 0 Incontinence Assessment and Management []  - 0 Ostomy Care Assessment and Management (repouching, etc.) PROCESS - Coordination of Care []  - 0 Simple Patient / Family Education for ongoing care []  - 0 Complex (extensive) Patient / Family Education for ongoing care []  - 0 Staff obtains Programmer, systems, Records, T Results / Process Orders est []  - 0 Staff telephones HHA, Nursing Homes / Clarify orders / etc []  - 0 Routine Transfer to another Facility (non-emergent condition) []  - 0 Routine Hospital Admission (non-emergent condition) []  - 0 New Admissions / Biomedical engineer / Ordering NPWT Apligraf, etc. , []  - 0 Emergency Hospital Admission (emergent condition) [name] ([id number]) [id number] Page 2 of 7 PROCESS - Special Needs []  - 0 Pediatric / Minor Patient Management []  - 0 Isolation Patient Management []  - 0 Hearing / Language / Visual special needs []  - 0 Assessment of Community assistance (transportation, D/C planning, etc.) []  - 0 Additional assistance / Altered mentation []  - 0 Support Surface(s) Assessment (bed, cushion, seat, etc.) INTERVENTIONS - Miscellaneous []  - 0 External ear exam []  - 0 Patient Transfer (multiple staff / Civil Service fast streamer / Similar devices) []  - 0 Simple Staple / Suture removal (25 or less) []  - 0 Complex Staple / Suture removal (26 or more) []  - 0 Hypo/Hyperglycemic Management (do not check if billed separately) []  - 0 Ankle / Brachial Index (ABI) - do not check if billed separately Has the patient been seen at the hospital within the last three years: Yes Total Score: 0 Level Of Care: ____ Electronic Signature(s) Signed: 11/21/2022 4:29:20 PM By: Joyce Loud MSN RN CNS  WTA Entered By: [name] on 11/21/2022 09:04:20 -------------------------------------------------------------------------------- Encounter Discharge Information Details Patient Name: Date of Service: NO  RMA [name] 11/21/2022 8:30 A M Medical Record Number: [id number] Patient Account Number: [id number] Date of Birth/Sex: Treating RN: 1951-05-03 (72 y.o. [name] Primary Care [name]: [name] Other Clinician: Referring [name]: Treating [name]/Extender: [name] in Treatment: 4 Encounter Discharge Information Items Post Procedure Vitals Discharge Condition: Stable Temperature (F): 97.8 Ambulatory Status: Ambulatory Pulse (bpm): 71 Discharge Destination: Home Respiratory Rate (breaths/min): 16 Transportation: Private Auto Blood Pressure (mmHg): 138/73 Accompanied By: self Schedule Follow-up Appointment: Yes Clinical Summary of Care: Electronic Signature(s) Signed: 11/21/2022 4:29:20 PM By: Joyce Loud MSN RN CNS WTA Entered By: [name] on 11/21/2022 09:21:21 -------------------------------------------------------------------------------- Lower Extremity Assessment Details Patient Name: Date of Service: NO RMA [name] 11/21/2022 8:30 A M Medical Record Number: [id number] Patient Account Number: [id number] Date of Birth/Sex: Treating RN: [birth year] (72 y.o. [name] Primary Care [name]: [name] Other Clinician: Referring [name]: Treating [name]/Extender: [name] in Treatment: 4 Edema Assessment Assessed: [Left: No] [name]: No] [Left: Edema] [name]: :] N[LeftKALAIYAH, [name] [id number] [[id number] Page 3 of 7] Calf Left: Right: Point of Measurement: 33 cm From Medial Instep 34.2 cm Ankle Left: Right: Point of Measurement: 12 cm From Medial Instep 22.2 cm Vascular Assessment Pulses: Dorsalis Pedis Palpable: [Left:Yes] Electronic  Signature(s) Signed: 11/21/2022 4:29:20 PM By: Joyce Loud MSN RN CNS WTA Entered By: [name] on 11/21/2022 08:59:57 -------------------------------------------------------------------------------- Multi Wound Chart Details Patient Name: Date of Service: NO RMA [name] 11/21/2022 8:30 A M Medical Record Number: [id number] Patient Account Number: [id number] Date of Birth/Sex: Treating RN: [birth year] (72 y.o. [name] Primary Care Adalene Gulotta: [name] Other Clinician: Referring Rosland Riding: Treating Brok Stocking/Extender: [name] in Treatment: 4 Vital Signs Height(in): 61 Pulse(bpm): 71 Weight(lbs): 160 Blood Pressure(mmHg): 138/73 Body Mass Index(BMI): 30.2 Temperature(F): 97.8 Respiratory Rate(breaths/min): 16 [1:Photos:] [N/A:N/A] Left, Medial Ankle N/A N/A Wound Location: Surgical Injury N/A N/A Wounding Event: Atypical N/A N/A Primary Etiology: Hypertension, Neuropathy N/A N/A Comorbid History: 09/23/2022 N/A N/A Date Acquired: 4 N/A N/A Weeks of Treatment: Open N/A N/A Wound Status: No N/A N/A Wound Recurrence: 1.6x1.4x0.1 N/A N/A Measurements L x W x D (cm) 1.759 N/A N/A A (cm) : rea 0.176 N/A N/A Volume (cm) : 30.00% N/A N/A % Reduction in A rea: 65.00% N/A N/A % Reduction in Volume: Full Thickness Without Exposed N/A N/A Classification: Support Structures Medium N/A N/A Exudate Amount: Serosanguineous N/A N/A Exudate Type: red, brown N/A N/A Exudate Color: None Present (0%) N/A N/A Granulation Amount: Large (67-100%) N/A N/A Necrotic Amount: Fat Layer (Subcutaneous Tissue): Yes N/A N/A Exposed Structures: Fascia: No Tendon: No Muscle: No Joint: No Bone: No None N/A N/A EpithelializationLARHONDA, [name] ([id number]) [id number].pdf Page 4 of 7 Treatment Notes Electronic Signature(s) Signed: 11/21/2022 4:29:20 PM By: Joyce Loud MSN RN CNS WTA Entered By: [name] on  11/21/2022 09:00:01 -------------------------------------------------------------------------------- Multi-Disciplinary Care Plan Details Patient Name: Date of Service: NO RMA [name] 11/21/2022 8:30 A M Medical Record Number: [id number] Patient Account Number: [id number] Date of Birth/Sex: Treating RN: Aug 08, 1951 (72 y.o. [name] Primary Care Detria Cummings: [name] Other Clinician: Referring Inika Bellanger: Treating Ruthanna Macchia/Extender: [name] in Treatment: 4 Active Inactive Necrotic Tissue Nursing Diagnoses: Impaired tissue integrity related to necrotic/devitalized tissue Knowledge deficit related to management of necrotic/devitalized tissue Goals: Necrotic/devitalized tissue will be minimized in the wound bed Date Initiated: 10/20/2022 Target Resolution Date: 11/20/2022 Goal Status: Active  Patient/caregiver will verbalize understanding of reason and process for debridement of necrotic tissue Date Initiated: 10/20/2022 Target Resolution Date: 11/20/2022 Goal Status: Active Interventions: Assess patient pain level pre-, during and post procedure and prior to discharge Provide education on necrotic tissue and debridement process Treatment Activities: Apply topical anesthetic as ordered : 10/18/2022 Notes: Electronic Signature(s) Signed: 11/21/2022 4:29:20 PM By: Joyce Loud MSN RN CNS WTA Entered By: [name] on 11/21/2022 09:06:07 -------------------------------------------------------------------------------- Pain Assessment Details Patient Name: Date of Service: NO RMA [name] 11/21/2022 8:30 A M Medical Record Number: [id number] Patient Account Number: [id number] Date of Birth/Sex: Treating RN: April 12, 1951 (72 y.o. [name] Primary Care Haston Casebolt: [name] Other Clinician: Referring Amias Hutchinson: Treating Keeara Frees/Extender: [name] in Treatment: 4 Active Problems Location of Pain Severity and Description of  Pain Patient Has Paino No Site Locations [place], [place] ([id number]) [id number] Page 5 of 7 Pain Management and Medication Current Pain Management: Goals for Pain Management Sharp pains at night only. None at this time Electronic Signature(s) Signed: 11/21/2022 4:29:20 PM By: Joyce Loud MSN RN CNS WTA Entered By: [name] on 11/21/2022 08:59:49 -------------------------------------------------------------------------------- Patient/Caregiver Education Details Patient Name: Date of Service: NO RMA [name] 4/2/2024andnbsp8:30 A M Medical Record Number: [id number] Patient Account Number: [id number] Date of Birth/Gender: Treating RN: 08-24-1950 (72 y.o. [name] Primary Care Physician: [name] Other Clinician: Referring Physician: Treating Physician/Extender: [name] in Treatment: 4 Education Assessment Education Provided To: Patient Education Topics Provided Wound Debridement: Handouts: Wound Debridement Methods: Explain/Verbal Responses: State content correctly Wound/Skin Impairment: Handouts: Caring for Your Ulcer Methods: Explain/Verbal Electronic Signature(s) Signed: 11/21/2022 4:29:20 PM By: Joyce Loud MSN RN CNS WTA Entered By: [name] on 11/21/2022 09:06:33 -------------------------------------------------------------------------------- Wound Assessment Details Patient Name: Date of Service: NO RMA [name] 11/21/2022 8:30 A M Medical Record Number: [id number] Patient Account Number: [id number] Date of Birth/Sex: Treating RN: Apr 17, 1951 (72 y.o. [name] Primary Care Vanilla [place]: [name] Other Clinician: Referring Edye Hainline: Treating Eytan Carrigan/Extender: [name] in Treatment: [address], [name] ([id number]) [id number] Page 6 of 7 Wound Status Wound Number: 1 Primary Etiology: Atypical Wound Location: Left, Medial  Ankle Wound Status: Open Wounding Event: Surgical Injury Comorbid History: Hypertension, Neuropathy Date Acquired: 09/23/2022 Weeks Of Treatment: 4 Clustered Wound: No Photos Wound Measurements Length: (cm) 1.6 Width: (cm) 1.4 Depth: (cm) 0.1 Area: (cm) 1.759 Volume: (cm) 0.176 % Reduction in Area: 30% % Reduction in Volume: 65% Epithelialization: None Wound Description Classification: Full Thickness Without Exposed Support Exudate Amount: Medium Exudate Type: Serosanguineous Exudate Color: red, brown Structures Foul Odor After Cleansing: No Slough/Fibrino Yes Wound Bed Granulation Amount: None Present (0%) Exposed Structure Necrotic Amount: Large (67-100%) Fascia Exposed: No Necrotic Quality: Adherent Slough Fat Layer (Subcutaneous Tissue) Exposed: Yes Tendon Exposed: No Muscle Exposed: No Joint Exposed: No Bone Exposed: No Treatment Notes Wound #1 (Ankle) Wound Laterality: Left, Medial Cleanser Vashe 5.8 (oz) Discharge Instruction: Use vashe 5.8 (oz) as directed Peri-Wound Care Topical Primary Dressing Prisma 4.34 (in) Discharge Instruction: Moisten w/normal saline or sterile water; Cover wound as directed. Do not remove from wound bed. Secondary Dressing ABD Pad 5x9 (in/in) Discharge Instruction: Cover with ABD pad Secured With Kerlix Roll Sterile or Non-Sterile 6-ply 4.5x4 (yd/yd) Discharge Instruction: Apply Kerlix as directed Compression Wrap Compression Stockings Add-Ons [name], [name] ([id number]) [id number] Page 7 of 7 Electronic Signature(s) Signed: 11/21/2022 4:29:20 PM By: Joyce Loud MSN RN CNS WTA  Entered By: [name] on 11/21/2022 08:43:22 -------------------------------------------------------------------------------- Vitals Details Patient Name: Date of Service: NO RMA [name] 11/21/2022 8:30 A M Medical Record Number: [id number] Patient Account Number: [id number] Date of Birth/Sex: Treating  RN: January 21, 1951 (72 y.o. [name] Primary Care Hideko Esselman: [name] Other Clinician: Referring Jurnei Latini: Treating Geofrey Silliman/Extender: [name] in Treatment: 4 Vital Signs Time Taken: 08:37 Temperature (F): 97.8 Height (in): 61 Pulse (bpm): 71 Weight (lbs): 160 Respiratory Rate (breaths/min): 16 Body Mass Index (BMI): 30.2 Blood Pressure (mmHg): 138/73 Reference Range: 80 - 120 mg / dl Electronic Signature(s) Signed: 11/21/2022 4:29:20 PM By: Joyce Loud MSN RN CNS WTA Entered By: [name] on 11/21/2022 08:59:45

## 2022-11-21 NOTE — Progress Notes (Addendum)
[name], Joyce AieaJANETTE ([id number]) [id number] Page 1 of 6 Visit Report for 11/21/2022 Chief Complaint Document Details Patient Name: Date of Service: NO RMA Stan Head, Joyce JA [name] 11/21/2022 8:30 A M Medical Record Number: [id number] Patient Account Number: [id number] Date of Birth/Sex: Treating RN: Jan 05, 1951 (72 y.o. [name]) Smith, Vicki Primary Care Provider: Rodrigo RanPerini, Mark Other Clinician: Referring Provider: Treating Provider/Extender: Kerry HoughStone, Breklyn Fabrizio Perini, Mark Weeks in Treatment: 4 Information Obtained from: Patient Chief Complaint 10/18/2022; [facility], right lower extremity skin tear from fall Electronic Signature(s) Signed: 11/21/2022 8:55:38 AM By: Lenda KelpStone III, Terianna Peggs PA-C Entered By: Lenda KelpStone III, Briony Parveen on 11/21/2022 08:55:38 -------------------------------------------------------------------------------- Debridement Details Patient Name: Date of Service: NO RMA Stan Head, Joyce JA [name] 11/21/2022 8:30 A M Medical Record Number: [id number] Patient Account Number: [id number] Date of Birth/Sex: Treating RN: Jan 05, 1951 (72 y.o. [name]) Smith, Vicki Primary Care Provider: Rodrigo RanPerini, Mark Other Clinician: Referring Provider: Treating Provider/Extender: Kerry HoughStone, Oneta Sigman Perini, Mark Weeks in Treatment: 4 Debridement Performed for Assessment: Wound #1 Left,Medial Ankle Performed By: Physician Nelida MeuseStone, Tymber Stallings E., PA-C Debridement Type: Debridement Level of Consciousness (Pre-procedure): Awake and Alert Pre-procedure Verification/Time Out Yes - 09:01 Taken: Start Time: 09:01 T Area Debrided (L x W): otal 1.6 (cm) x 1.4 (cm) = 2.24 (cm) Tissue and other material debrided: Viable, Non-Viable, Slough, Subcutaneous, Slough Level: Skin/Subcutaneous Tissue Debridement Description: Excisional Instrument: Curette Bleeding: Minimum Hemostasis Achieved: Pressure Response to Treatment: Procedure was tolerated well Level of Consciousness (Post- Awake  and Alert procedure): Post Debridement Measurements of Total Wound Length: (cm) 1.6 Width: (cm) 1.4 Depth: (cm) 0.2 Volume: (cm) 0.352 Character of Wound/Ulcer Post Debridement: Stable Post Procedure Diagnosis Same as Pre-procedure Electronic Signature(s) Signed: 11/21/2022 4:29:20 PM By: Midge AverSmith, Vicki MSN RN CNS WTA Signed: 11/24/2022 8:47:01 AM By: Lenda KelpStone III, Genetta Fiero PA-C Entered By: Midge AverSmith, Vicki on 11/21/2022 09:02:53 HPI Details -------------------------------------------------------------------------------- George HughNORMAN, [name] ([id number]) [id number] Page 2 of 6 Patient Name: Date of Service: NO RMA Stan Head, Joyce JA [name] 11/21/2022 8:30 A M Medical Record Number: [id number] Patient Account Number: [id number] Date of Birth/Sex: Treating RN: Jan 05, 1951 (72 y.o. [name]) Smith, Vicki Primary Care Provider: Rodrigo RanPerini, Mark Other Clinician: Referring Provider: Treating Provider/Extender: Kerry HoughStone, Connelly [place] Perini, Mark Weeks in Treatment: 4 History of Present Illness HPI Description: 10/18/2022 Ms. Richardo Priestdna Jeanette Kaufman is a 72 year old female with a past medical history of A-fib on Eliquis, Wegener's granulomatosis with vasculitis, and basal cell carcinoma of the left leg that presents the clinic for a wound to the left leg status post Mohs procedure for basal cell carcinoma removal and skin tear to the right lower extremity that occurred 3 days ago after a fall. She has been using Vaseline to the wound beds. She has compression stockings and has been using these. She currently has chronic pain to the left leg wound site with slight erythema and warmth to the periwound. 3/6; patient presents for follow-up. She has been using Medihoney and Hydrofera Blue to the left lower extremity wound and Xeroform to the right lower extremity wound. She denies signs of infection. She completed her oral antibiotics. 11-07-2022 upon evaluation today patient appears to be doing well currently in regard  to her wound. This actually did require some sharp debridement to clearway the necrotic debris. She actually tolerated that debridement today well. 11-14-2022 upon evaluation today patient appears to be doing well currently in regard to her wound. There is some slough and biofilm noted on the surface of the wound currently but fortunately nothing that appears  to be too significant. I do think she is going to require some sharp debridement today. Fortunately I do not see any evidence of infection locally or systemically at this point. 11-21-2022 upon evaluation today patient appears to be doing well currently in regard to her wound although it is slow to heal she is making some slight progress. I think we should consider going forward with a skin sub-I discussed that with her today I think Apligraf would be a good idea. Electronic Signature(s) Signed: 11/21/2022 9:11:37 AM By: Lenda Kelp PA-C Entered By: Lenda Kelp on 11/21/2022 09:11:37 -------------------------------------------------------------------------------- Physical Exam Details Patient Name: Date of Service: NO RMA Stan Head [name] 11/21/2022 8:30 A M Medical Record Number: [id number] Patient Account Number: [id number] Date of Birth/Sex: Treating RN: 10-03-1950 (73 y.o. [name] Primary Care Provider: Rodrigo Ran Other Clinician: Referring Provider: Treating Provider/Extender: Kerry Hough in Treatment: 4 Constitutional Well-nourished and well-hydrated in no acute distress. Respiratory normal breathing without difficulty. Psychiatric this patient is able to make decisions and demonstrates good insight into disease process. Alert and Oriented x 3. pleasant and cooperative. Notes Upon inspection patient's wound bed actually showed signs of the need for sharp debridement I did perform debridement today to clearway necrotic debris and the patient tolerated that without complication postdebridement the  wound bed is significantly improved. With that being said it is still very slow to heal I think we need to do something to correct this. Electronic Signature(s) Signed: 11/21/2022 9:11:56 AM By: Lenda Kelp PA-C Entered By: Lenda Kelp on 11/21/2022 09:11:56 -------------------------------------------------------------------------------- Physician Orders Details Patient Name: Date of Service: NO RMA Stan Head [name] 11/21/2022 8:30 A M Medical Record Number: [id number] Patient Account Number: [id number] Date of Birth/Sex: Treating RN: 1951-08-16 (72 y.o. [name] Primary Care Provider: Rodrigo Ran Other Clinician: Referring Provider: Treating Provider/Extender: Kerry Hough in Treatment: 4 Verbal / Phone Orders: No GISSELL, BARRA ([id number]) [id number] Page 3 of 6 Diagnosis Coding ICD-10 Coding Code Description T81.31XA Disruption of external operation (surgical) wound, not elsewhere classified, initial encounter L97.322 Non-pressure chronic ulcer of left ankle with fat layer exposed C44.91 Basal cell carcinoma of skin, unspecified S81.811A Laceration without foreign body, right lower leg, initial encounter I48.20 Chronic atrial fibrillation, unspecified Z79.01 Long term (current) use of anticoagulants Follow-up Appointments Return Appointment in 1 week. Nurse Visit as needed Bathing/ Shower/ Hygiene Wound #1 Left,Medial Ankle Wash wounds with antibacterial soap and water. May shower; gently cleanse wound with antibacterial soap, rinse and pat dry prior to dressing wounds No tub bath. Anesthetic (Use 'Patient Medications' Section for Anesthetic Order Entry) Lidocaine applied to wound bed Cellular or Tissue Based Products Wound #1 Left,Medial Ankle Other Cellular or Tissue Based Products Orders/Instructions: - VOB for Apligraf Edema Control - Lymphedema / Segmental Compressive Device / Other Wound #1 Left,Medial  Ankle Patient to wear own compression stockings. Remove compression stockings every night before going to bed and put on every morning when getting up. - Bilat lower legs Elevate, Exercise Daily and A void Standing for Long Periods of Time. Wound Treatment Wound #1 - Ankle Wound Laterality: Left, Medial Cleanser: Vashe 5.8 (oz) 3 x Per Week/30 Days Discharge Instructions: Use vashe 5.8 (oz) as directed Prim Dressing: Prisma 4.34 (in) (Dispense As Written) 3 x Per Week/30 Days ary Discharge Instructions: Moisten w/normal saline or sterile water; Cover wound as directed. Do not remove from wound bed. Secondary Dressing: ABD Pad 5x9 (in/in)  3 x Per Week/30 Days Discharge Instructions: Cover with ABD pad Secured With: Kerlix Roll Sterile or Non-Sterile 6-ply 4.5x4 (yd/yd) 3 x Per Week/30 Days Discharge Instructions: Apply Kerlix as directed Electronic Signature(s) Signed: 11/21/2022 4:29:20 PM By: Midge Aver MSN RN CNS WTA Signed: 11/24/2022 8:47:01 AM By: Lenda Kelp PA-C Entered By: Midge Aver on 11/21/2022 09:05:41 -------------------------------------------------------------------------------- Problem List Details Patient Name: Date of Service: NO RMA Winona Legato JA [name] 11/21/2022 8:30 A M Medical Record Number: [id number] Patient Account Number: [id number] Date of Birth/Sex: Treating RN: [birth year] (72 y.o. [name] Primary Care Provider: Rodrigo Ran Other Clinician: Referring Provider: Treating Provider/Extender: Kerry Hough in Treatment: 4 Active Problems ICD-10 Encounter Code Description Active Date MDM Diagnosis AMIT, MELOY ([id number]) [id number] Page 4 of 6 T81.31XA Disruption of external operation (surgical) wound, not elsewhere classified, 10/18/2022 No Yes initial encounter L97.322 Non-pressure chronic ulcer of left ankle with fat layer exposed 10/18/2022 No Yes C44.91 Basal cell carcinoma of skin,  unspecified 10/18/2022 No Yes S81.811A Laceration without foreign body, right lower leg, initial encounter 10/18/2022 No Yes I48.20 Chronic atrial fibrillation, unspecified 10/18/2022 No Yes Z79.01 Long term (current) use of anticoagulants 10/18/2022 No Yes Inactive Problems Resolved Problems Electronic Signature(s) Signed: 11/21/2022 4:29:20 PM By: Midge Aver MSN RN CNS WTA Signed: 11/24/2022 8:47:01 AM By: Lenda Kelp PA-C Previous Signature: 11/21/2022 8:55:32 AM Version By: Lenda Kelp PA-C Entered By: Midge Aver on 11/21/2022 09:06:52 -------------------------------------------------------------------------------- Progress Note Details Patient Name: Date of Service: NO RMA Winona Legato JA [name] 11/21/2022 8:30 A M Medical Record Number: [id number] Patient Account Number: [id number] Date of Birth/Sex: Treating RN: Oct 18, 1950 (72 y.o. [name] Primary Care Provider: Rodrigo Ran Other Clinician: Referring Provider: Treating Provider/Extender: Kerry Hough in Treatment: 4 Subjective Chief Complaint Information obtained from Patient 10/18/2022; left lower extremity wound status post [facility] removal with mohs surgery, right lower extremity skin tear from fall History of Present Illness (HPI) 10/18/2022 Ms. Cyani Kallstrom is a 72 year old female with a past medical history of A-fib on Eliquis, Wegener's granulomatosis with vasculitis, and basal cell carcinoma of the left leg that presents the clinic for a wound to the left leg status post Mohs procedure for basal cell carcinoma removal and skin tear to the right lower extremity that occurred 3 days ago after a fall. She has been using Vaseline to the wound beds. She has compression stockings and has been using these. She currently has chronic pain to the left leg wound site with slight erythema and warmth to the periwound. 3/6; patient presents for follow-up. She has been using Medihoney and Hydrofera Blue to the  left lower extremity wound and Xeroform to the right lower extremity wound. She denies signs of infection. She completed her oral antibiotics. 11-07-2022 upon evaluation today patient appears to be doing well currently in regard to her wound. This actually did require some sharp debridement to clearway the necrotic debris. She actually tolerated that debridement today well. 11-14-2022 upon evaluation today patient appears to be doing well currently in regard to her wound. There is some slough and biofilm noted on the surface of the wound currently but fortunately nothing that appears to be too significant. I do think she is going to require some sharp debridement today. Fortunately I do not see any evidence of infection locally or systemically at this point. 11-21-2022 upon evaluation today patient appears to be doing well currently in regard to her wound although it  is slow to heal she is making some slight progress. I think we should consider going forward with a skin sub-I discussed that with her today I think Apligraf would be a good idea. [name], Joyce [name] ([id number]) [id number] Page 5 of 6 Objective Constitutional Well-nourished and well-hydrated in no acute distress. Vitals Time Taken: 8:37 AM, Height: 61 in, Weight: 160 lbs, BMI: 30.2, Temperature: 97.8 F, Pulse: 71 bpm, Respiratory Rate: 16 breaths/min, Blood Pressure: 138/73 mmHg. Respiratory normal breathing without difficulty. Psychiatric this patient is able to make decisions and demonstrates good insight into disease process. Alert and Oriented x 3. pleasant and cooperative. General Notes: Upon inspection patient's wound bed actually showed signs of the need for sharp debridement I did perform debridement today to clearway necrotic debris and the patient tolerated that without complication postdebridement the wound bed is significantly improved. With that being said it is still very slow to heal I  think we need to do something to correct this. Integumentary (Hair, Skin) Wound #1 status is Open. Original cause of wound was Surgical Injury. The date acquired was: 09/23/2022. The wound has been in treatment 4 weeks. The wound is located on the Left,Medial Ankle. The wound measures 1.6cm length x 1.4cm width x 0.1cm depth; 1.759cm^2 area and 0.176cm^3 volume. There is Fat Layer (Subcutaneous Tissue) exposed. There is a medium amount of serosanguineous drainage noted. There is no granulation within the wound bed. There is a large (67-100%) amount of necrotic tissue within the wound bed including Adherent Slough. Assessment Active Problems ICD-10 Disruption of external operation (surgical) wound, not elsewhere classified, initial encounter Non-pressure chronic ulcer of left ankle with fat layer exposed Basal cell carcinoma of skin, unspecified Laceration without foreign body, right lower leg, initial encounter Chronic atrial fibrillation, unspecified Long term (current) use of anticoagulants Procedures Wound #1 Pre-procedure diagnosis of Wound #1 is an Atypical located on the Left,Medial Ankle . There was a Excisional Skin/Subcutaneous Tissue Debridement with a total area of 2.24 sq cm performed by Nelida MeuseStone, Candies Palm E., PA-C. With the following instrument(s): Curette to remove Viable and Non-Viable tissue/material. Material removed includes Subcutaneous Tissue and Slough and. No specimens were taken. A time out was conducted at 09:01, prior to the start of the procedure. A Minimum amount of bleeding was controlled with Pressure. The procedure was tolerated well. Post Debridement Measurements: 1.6cm length x 1.4cm width x 0.2cm depth; 0.352cm^3 volume. Character of Wound/Ulcer Post Debridement is stable. Post procedure Diagnosis Wound #1: Same as Pre-Procedure Plan Follow-up Appointments: Return Appointment in 1 week. Nurse Visit as needed Bathing/ Shower/ Hygiene: Wound #1 Left,Medial  Ankle: Wash wounds with antibacterial soap and water. May shower; gently cleanse wound with antibacterial soap, rinse and pat dry prior to dressing wounds No tub bath. Anesthetic (Use 'Patient Medications' Section for Anesthetic Order Entry): Lidocaine applied to wound bed Cellular or Tissue Based Products: Wound #1 Left,Medial Ankle: Other Cellular or Tissue Based Products Orders/Instructions: - VOB for Apligraf Edema Control - Lymphedema / Segmental Compressive Device / Other: Wound #1 Left,Medial Ankle: Patient to wear own compression stockings. Remove compression stockings every night before going to bed and put on every morning when getting up. - Bilat lower legs Elevate, Exercise Daily and Avoid Standing for Long Periods of Time. WOUND #1: - Ankle Wound Laterality: Left, Medial Cleanser: Vashe 5.8 (oz) 3 x Per Week/30 Days [name], [name] ([id number]) [id number] Page 6 of 6 Discharge Instructions: Use vashe 5.8 (oz) as directed Prim Dressing: Prisma 4.34 (in) (Dispense As  Written) 3 x Per Week/30 Days ary Discharge Instructions: Moisten w/normal saline or sterile water; Cover wound as directed. Do not remove from wound bed. Secondary Dressing: ABD Pad 5x9 (in/in) 3 x Per Week/30 Days Discharge Instructions: Cover with ABD pad Secured With: Kerlix Roll Sterile or Non-Sterile 6-ply 4.5x4 (yd/yd) 3 x Per Week/30 Days Discharge Instructions: Apply Kerlix as directed 1. I would recommend currently that we have the patient continue to monitor for any signs of infection or worsening. Based on what I am seeing I do believe that we are headed in the right direction which is great news but is just slow. I would like to see about getting Apligraf approved as I think this would bolster her chances to get this healed as quickly as possible limiting the chance of her becoming infected greatly. 2. Also can recommend that we continue with ABD pad cover followed by  roll gauze secured in place to avoid any sticky dressings which she states she feels like is making her itch. 3. I am also going to suggest that she should continue to elevate her leg to help with edema control she is wearing her own compression stockings. We will see patient back for reevaluation in 1 week here in the clinic. If anything worsens or changes patient will contact our office for additional recommendations. Electronic Signature(s) Signed: 11/21/2022 9:12:40 AM By: Lenda Kelp PA-C Entered By: Lenda Kelp on 11/21/2022 09:12:40 -------------------------------------------------------------------------------- SuperBill Details Patient Name: Date of Service: NO RMA Stan Head [name] 11/21/2022 Medical Record Number: [id number] Patient Account Number: [id number] Date of Birth/Sex: Treating RN: 06/28/1951 (72 y.o. [name] Primary Care Provider: Rodrigo Ran Other Clinician: Referring Provider: Treating Provider/Extender: Kerry Hough in Treatment: 4 Diagnosis Coding ICD-10 Codes Code Description T81.31XA Disruption of external operation (surgical) wound, not elsewhere classified, initial encounter L97.322 Non-pressure chronic ulcer of left ankle with fat layer exposed C44.91 Basal cell carcinoma of skin, unspecified S81.811A Laceration without foreign body, right lower leg, initial encounter I48.20 Chronic atrial fibrillation, unspecified Z79.01 Long term (current) use of anticoagulants Facility Procedures : CPT4 Code: [id number] Description: 11042 - DEB SUBQ TISSUE 20 SQ CM/< ICD-10 Diagnosis Description L97.322 Non-pressure chronic ulcer of left ankle with fat layer exposed Modifier: Quantity: 1 Physician Procedures : CPT4 Code Description Modifier [id number] 11042 - WC PHYS SUBQ TISS 20 SQ CM ICD-10 Diagnosis Description L97.322 Non-pressure chronic ulcer of left ankle with fat layer exposed Quantity: 1 Electronic Signature(s) Signed: 11/21/2022  9:12:55 AM By: Lenda Kelp PA-C Entered By: Lenda Kelp on 11/21/2022 09:12:55

## 2022-11-22 DIAGNOSIS — M545 Low back pain, unspecified: Secondary | ICD-10-CM | POA: Diagnosis not present

## 2022-11-22 DIAGNOSIS — M6281 Muscle weakness (generalized): Secondary | ICD-10-CM | POA: Diagnosis not present

## 2022-11-22 DIAGNOSIS — R293 Abnormal posture: Secondary | ICD-10-CM | POA: Diagnosis not present

## 2022-11-22 DIAGNOSIS — M25511 Pain in right shoulder: Secondary | ICD-10-CM | POA: Diagnosis not present

## 2022-11-24 DIAGNOSIS — M25511 Pain in right shoulder: Secondary | ICD-10-CM | POA: Diagnosis not present

## 2022-11-24 DIAGNOSIS — M545 Low back pain, unspecified: Secondary | ICD-10-CM | POA: Diagnosis not present

## 2022-11-24 DIAGNOSIS — R293 Abnormal posture: Secondary | ICD-10-CM | POA: Diagnosis not present

## 2022-11-24 DIAGNOSIS — M6281 Muscle weakness (generalized): Secondary | ICD-10-CM | POA: Diagnosis not present

## 2022-11-27 DIAGNOSIS — M6281 Muscle weakness (generalized): Secondary | ICD-10-CM | POA: Diagnosis not present

## 2022-11-27 DIAGNOSIS — M25511 Pain in right shoulder: Secondary | ICD-10-CM | POA: Diagnosis not present

## 2022-11-27 DIAGNOSIS — R293 Abnormal posture: Secondary | ICD-10-CM | POA: Diagnosis not present

## 2022-11-27 DIAGNOSIS — M47896 Other spondylosis, lumbar region: Secondary | ICD-10-CM | POA: Diagnosis not present

## 2022-11-27 DIAGNOSIS — Z79899 Other long term (current) drug therapy: Secondary | ICD-10-CM | POA: Diagnosis not present

## 2022-11-27 DIAGNOSIS — M545 Low back pain, unspecified: Secondary | ICD-10-CM | POA: Diagnosis not present

## 2022-11-27 DIAGNOSIS — M542 Cervicalgia: Secondary | ICD-10-CM | POA: Diagnosis not present

## 2022-11-27 DIAGNOSIS — G894 Chronic pain syndrome: Secondary | ICD-10-CM | POA: Diagnosis not present

## 2022-11-28 ENCOUNTER — Encounter: Payer: Medicare Other | Admitting: Physician Assistant

## 2022-11-28 DIAGNOSIS — Z7901 Long term (current) use of anticoagulants: Secondary | ICD-10-CM | POA: Diagnosis not present

## 2022-11-28 DIAGNOSIS — T8131XA Disruption of external operation (surgical) wound, not elsewhere classified, initial encounter: Secondary | ICD-10-CM | POA: Diagnosis not present

## 2022-11-28 DIAGNOSIS — I482 Chronic atrial fibrillation, unspecified: Secondary | ICD-10-CM | POA: Diagnosis not present

## 2022-11-28 DIAGNOSIS — M313 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: Secondary | ICD-10-CM | POA: Diagnosis not present

## 2022-11-28 DIAGNOSIS — G8929 Other chronic pain: Secondary | ICD-10-CM | POA: Diagnosis not present

## 2022-11-28 DIAGNOSIS — L97322 Non-pressure chronic ulcer of left ankle with fat layer exposed: Secondary | ICD-10-CM | POA: Diagnosis not present

## 2022-11-28 DIAGNOSIS — I10 Essential (primary) hypertension: Secondary | ICD-10-CM | POA: Diagnosis not present

## 2022-11-28 NOTE — Progress Notes (Signed)
[name], [name] ([id number]) [id number] Page 1 of 7 Visit Report for 11/28/2022 Chief Complaint Document Details Patient Name: Date of Service: [name] 11/28/2022 8:30 A M Medical Record Number: [id number] Patient Account Number: [id number] Date of Birth/Sex: Treating RN: Jan 05, 1951 (72 y.o. [name] Primary Care Provider: Rodrigo Kaufman Other Clinician: Referring Provider: Treating Provider/Extender: [name] in Treatment: 5 Information Obtained from: Patient Chief Complaint 10/18/2022; left lower extremity wound status post [name] removal with mohs surgery, right lower extremity skin tear from fall Electronic Signature(s) Signed: 11/28/2022 9:22:34 AM By: Joyce Kelp PA-C Entered By: [name] on 11/28/2022 09:22:34 -------------------------------------------------------------------------------- Cellular or Tissue Based Product Details Patient Name: Date of Service: [name] 11/28/2022 8:30 A M Medical Record Number: [id number] Patient Account Number: [id number] Date of Birth/Sex: Treating RN: 1951-08-16 (72 y.o. [name] Primary Care Provider: Rodrigo Kaufman Other Clinician: Referring Provider: Treating Provider/Extender: [name] in Treatment: 5 Cellular or Tissue Based Product Type Wound #1 Left,Medial Ankle Applied to: Performed By: Physician [name]., PA-C Cellular or Tissue Based Product Type: Apligraf Level of Consciousness (Pre-procedure): Awake and Alert Pre-procedure Verification/Time Out Yes - 09:28 Taken: Location: trunk / arms / legs Wound Size (sq cm): 3 Product Size (sq cm): 44 Waste Size (sq cm): 33 Amount of Product Applied (sq cm): 11 Instrument Used: Blade, Forceps, Scissors Lot #: [id number] Expiration Date: 12/06/2022 Fenestrated: Yes Instrument: Blade Secured: Yes Secured With: Steri-Strips Dressing Applied:  Yes Procedural Pain: 0 Post Procedural Pain: 0 Response to Treatment: Procedure was tolerated well Level of Consciousness (Post- Awake and Alert procedure): Post Procedure Diagnosis Same as Pre-procedure Electronic Signature(s) Signed: 11/28/2022 5:03:29 PM By: Joyce Aver MSN RN CNS WTA Entered By: [name] on 11/28/2022 09:29:54 Debridement Details -------------------------------------------------------------------------------- [name] ([id number]) [id number] Page 2 of 7 Patient Name: Date of Service: [name] 11/28/2022 8:30 A M Medical Record Number: [id number] Patient Account Number: [id number] Date of Birth/Sex: Treating RN: Jun 23, 1951 (72 y.o. [name] Primary Care Provider: Rodrigo Kaufman Other Clinician: Referring Provider: Treating Provider/Extender: [name] in Treatment: 5 Debridement Performed for Assessment: Wound #1 Left,Medial Ankle Performed By: Physician [name]., PA-C Debridement Type: Debridement Level of Consciousness (Pre-procedure): Awake and Alert Pre-procedure Verification/Time Out Yes - 09:24 Taken: Start Time: 09:24 Pain Control: Lidocaine 4% T opical Solution T Area Debrided (L x W): otal 2 (cm) x 1.5 (cm) = 3 (cm) Tissue and other material debrided: Viable, Non-Viable, Slough, Subcutaneous, Slough Level: Skin/Subcutaneous Tissue Debridement Description: Excisional Instrument: Curette Bleeding: Minimum Hemostasis Achieved: Pressure Response to Treatment: Procedure was tolerated well Level of Consciousness (Post- Awake and Alert procedure): Post Debridement Measurements of Total Wound Length: (cm) 2 Width: (cm) 1.5 Depth: (cm) 0.2 Volume: (cm) 0.471 Character of Wound/Ulcer Post Debridement: Stable Post Procedure Diagnosis Same as Pre-procedure Electronic Signature(s) Signed: 11/28/2022 5:03:29 PM By: Joyce Aver MSN RN CNS WTA Signed: 11/28/2022  6:21:14 PM By: Joyce Derry PA-C Entered By: [name] on 11/28/2022 09:25:58 -------------------------------------------------------------------------------- HPI Details Patient Name: Date of Service: Joyce RMA [name] 11/28/2022 8:30 A M Medical Record Number: [id number] Patient Account Number: [id number] Date of Birth/Sex: Treating RN: September 29, 1950 (72 y.o. [name] Primary Care Provider: Rodrigo Kaufman Other Clinician: Referring Provider: Treating Provider/Extender: [name] in Treatment: 5 History of Present Illness HPI Description: 10/18/2022 [name]  is a 72 year old female with a past medical history of A-fib on Eliquis, Wegener's granulomatosis with vasculitis, and basal cell carcinoma of the left leg that presents the clinic for a wound to the left leg status post Mohs procedure for basal cell carcinoma removal and skin tear to the right lower extremity that occurred 3 days ago after a fall. She has been using Vaseline to the wound beds. She has compression stockings and has been using these. She currently has chronic pain to the left leg wound site with slight erythema and warmth to the periwound. 3/6; patient presents for follow-up. She has been using Medihoney and Hydrofera Blue to the left lower extremity wound and Xeroform to the right lower extremity wound. She denies signs of infection. She completed her oral antibiotics. 11-07-2022 upon evaluation today patient appears to be doing well currently in regard to her wound. This actually did require some sharp debridement to clearway the necrotic debris. She actually tolerated that debridement today well. 11-14-2022 upon evaluation today patient appears to be doing well currently in regard to her wound. There is some slough and biofilm noted on the surface of the wound currently but fortunately nothing that appears to be too significant. I do think she is going to require some sharp  debridement today. Fortunately I do not see any evidence of infection locally or systemically at this point. 11-21-2022 upon evaluation today patient appears to be doing well currently in regard to her wound although it is slow to heal she is making some slight progress. I think we should consider going forward with a skin sub-I discussed that with her today I think Apligraf would be a good idea. 11-28-2022 upon evaluation today patient is here today for her first application of Apligraf which she has actually been doing very well with. Fortunately there does not appear to be any signs of active infection at this time. MACALA, PROKOSCH [place] ([id number]) [id number] Page 3 of 7 Electronic Signature(s) Signed: 11/28/2022 9:41:05 AM By: Joyce Kelp PA-C Entered By: [name] on 11/28/2022 09:41:05 -------------------------------------------------------------------------------- Physical Exam Details Patient Name: Date of Service: [name] 11/28/2022 8:30 A M Medical Record Number: [id number] Patient Account Number: [id number] Date of Birth/Sex: Treating RN: [birth year] (72 y.o. [name] Primary Care Provider: Rodrigo Kaufman Other Clinician: Referring Provider: Treating Provider/Extender: [name] in Treatment: 5 Constitutional Well-nourished and well-hydrated in Joyce acute distress. Respiratory normal breathing without difficulty. Psychiatric this patient is able to make decisions and demonstrates good insight into disease process. Alert and Oriented x 3. pleasant and cooperative. Notes Upon inspection patient's wound bed showed signs of some need for sharp debridement in preparation for applying the Apligraf she tolerated debridement today without complication postdebridement the wound bed is significantly improved. I then applied the Apligraf which was the first in the series today and secured with Steri-Strips and  Adaptic. Electronic Signature(s) Signed: 11/28/2022 9:41:49 AM By: Joyce Kelp PA-C Entered By: [name] on 11/28/2022 09:41:48 -------------------------------------------------------------------------------- Physician Orders Details Patient Name: Date of Service: Joyce RMA [name] 11/28/2022 8:30 A M Medical Record Number: [id number] Patient Account Number: [id number] Date of Birth/Sex: Treating RN: 01/07/1951 (72 y.o. [name] Primary Care Provider: Rodrigo Kaufman Other Clinician: Referring Provider: Treating Provider/Extender: [name] in Treatment: 5 Verbal / Phone Orders: Joyce Diagnosis Coding ICD-10 Coding Code Description T81.31XA Disruption of external operation (surgical) wound, not elsewhere classified, initial encounter  U27.25397.322 Non-pressure chronic ulcer of left ankle with fat layer exposed C44.91 Basal cell carcinoma of skin, unspecified S81.811A Laceration without foreign body, right lower leg, initial encounter I48.20 Chronic atrial fibrillation, unspecified Z79.01 Long term (current) use of anticoagulants Follow-up Appointments Return Appointment in 1 week. Nurse Visit as needed Bathing/ Shower/ Hygiene Wound #1 Left,Medial Ankle Wash wounds with antibacterial soap and water. May shower with wound dressing protected with water repellent cover or cast protector. Joyce tub bath. Anesthetic (Use 'Patient Medications' Section for Anesthetic Order Entry) Lidocaine applied to wound bed Cellular or Tissue Based Products Idelle JoORMAN, [name] ([id number]) [id number] Page 4 of 7 Wound #1 Left,Medial Ankle Cellular or Tissue Based Product Type: - Apligraf Cellular or Tissue Based Product applied to wound bed; including contact layer, fixation with steri-strips, dry gauze and cover dressing. (DO NOT REMOVE). - May change outer dressing only Edema Control - Lymphedema / Segmental Compressive Device / Other Wound  #1 Left,Medial Ankle Patient to wear own compression stockings. Remove compression stockings every night before going to bed and put on every morning when getting up. - Bilat lower legs Elevate, Exercise Daily and A void Standing for Long Periods of Time. Wound Treatment Wound #1 - Ankle Wound Laterality: Left, Medial Cleanser: Vashe 5.8 (oz) 1 x Per Day/30 Days Discharge Instructions: Use vashe 5.8 (oz) as directed Prim Dressing: Apligraf ary 1 x Per Day/30 Days Secondary Dressing: (BORDER) Zetuvit Plus SILICONE BORDER Dressing 5x5 (in/in) 1 x Per Day/30 Days Discharge Instructions: Please do not put silicone bordered dressings under wraps. Use non-bordered dressing only. Electronic Signature(s) Signed: 11/28/2022 5:03:29 PM By: Joyce AverSmith, Vicki MSN RN CNS WTA Signed: 11/28/2022 6:21:14 PM By: Joyce DerryStone, Sinaya Minogue PA-C Entered By: Joyce AverSmith, Vicki on 11/28/2022 09:43:01 -------------------------------------------------------------------------------- Problem List Details Patient Name: Date of Service: Joyce RMA Joyce Legato, Joyce [name] 11/28/2022 8:30 A M Medical Record Number: [id number] Patient Account Number: [id number] Date of Birth/Sex: Treating RN: 31-May-1951 (72 y.o. Joyce PitmanF) Smith, Vicki Primary Care Provider: Rodrigo RanPerini, Mark Other Clinician: Referring Provider: Treating Provider/Extender: Joyce HoughStone, Izela Altier Perini, Mark Weeks in Treatment: 5 Active Problems ICD-10 Encounter Code Description Active Date MDM Diagnosis T81.31XA Disruption of external operation (surgical) wound, not elsewhere classified, 10/18/2022 Joyce Yes initial encounter L97.322 Non-pressure chronic ulcer of left ankle with fat layer exposed 10/18/2022 Joyce Yes C44.91 Basal cell carcinoma of skin, unspecified 10/18/2022 Joyce Yes S81.811A Laceration without foreign body, right lower leg, initial encounter 10/18/2022 Joyce Yes I48.20 Chronic atrial fibrillation, unspecified 10/18/2022 Joyce Yes Z79.01 Long term (current) use of anticoagulants 10/18/2022 Joyce Yes Inactive  Problems Resolved Problems Joyce HughORMAN, Joyce JANETTE ([id number]) [id number] Page 5 of 7 Electronic Signature(s) Signed: 11/28/2022 5:03:29 PM By: Joyce AverSmith, Vicki MSN RN CNS WTA Signed: 11/28/2022 6:21:14 PM By: Joyce DerryStone, Deshae Dickison PA-C Previous Signature: 11/28/2022 9:22:30 AM Version By: Joyce KelpStone III, Denae Zulueta PA-C Entered By: Joyce AverSmith, Vicki on 11/28/2022 09:43:49 -------------------------------------------------------------------------------- Progress Note Details Patient Name: Date of Service: Joyce RMA Joyce Legato, Joyce [name] 11/28/2022 8:30 A M Medical Record Number: [id number] Patient Account Number: [id number] Date of Birth/Sex: Treating RN: 31-May-1951 (72 y.o. Joyce PitmanF) Smith, Vicki Primary Care Provider: Rodrigo RanPerini, Mark Other Clinician: Referring Provider: Treating Provider/Extender: Joyce HoughStone, Tala Eber Perini, Mark Weeks in Treatment: 5 Subjective Chief Complaint Information obtained from Patient 10/18/2022; [facility], right lower extremity skin tear from fall History of Present Illness (HPI) 10/18/2022 [name] is a 72 year old female with a past medical history of A-fib on Eliquis, Wegener's granulomatosis with vasculitis, and basal  cell carcinoma of the left leg that presents the clinic for a wound to the left leg status post Mohs procedure for basal cell carcinoma removal and skin tear to the right lower extremity that occurred 3 days ago after a fall. She has been using Vaseline to the wound beds. She has compression stockings and has been using these. She currently has chronic pain to the left leg wound site with slight erythema and warmth to the periwound. 3/6; patient presents for follow-up. She has been using Medihoney and Hydrofera Blue to the left lower extremity wound and Xeroform to the right lower extremity wound. She denies signs of infection. She completed her oral antibiotics. 11-07-2022 upon evaluation today patient  appears to be doing well currently in regard to her wound. This actually did require some sharp debridement to clearway the necrotic debris. She actually tolerated that debridement today well. 11-14-2022 upon evaluation today patient appears to be doing well currently in regard to her wound. There is some slough and biofilm noted on the surface of the wound currently but fortunately nothing that appears to be too significant. I do think she is going to require some sharp debridement today. Fortunately I do not see any evidence of infection locally or systemically at this point. 11-21-2022 upon evaluation today patient appears to be doing well currently in regard to her wound although it is slow to heal she is making some slight progress. I think we should consider going forward with a skin sub-I discussed that with her today I think Apligraf would be a good idea. 11-28-2022 upon evaluation today patient is here today for her first application of Apligraf which she has actually been doing very well with. Fortunately there does not appear to be any signs of active infection at this time. Objective Constitutional Well-nourished and well-hydrated in Joyce acute distress. Vitals Time Taken: 8:40 AM, Height: 61 in, Weight: 160 lbs, BMI: 30.2, Temperature: 97.8 F, Pulse: 72 bpm, Respiratory Rate: 16 breaths/min, Blood Pressure: 129/80 mmHg. Respiratory normal breathing without difficulty. Psychiatric this patient is able to make decisions and demonstrates good insight into disease process. Alert and Oriented x 3. pleasant and cooperative. General Notes: Upon inspection patient's wound bed showed signs of some need for sharp debridement in preparation for applying the Apligraf she tolerated debridement today without complication postdebridement the wound bed is significantly improved. I then applied the Apligraf which was the first in the series today and secured with Steri-Strips and Adaptic. Integumentary  (Hair, Skin) Wound #1 status is Open. Original cause of wound was Surgical Injury. The date acquired was: 09/23/2022. The wound has been in treatment 5 weeks. The wound is located on the Left,Medial Ankle. The wound measures 2cm length x 1.5cm width x 0.1cm depth; 2.356cm^2 area and 0.236cm^3 volume. There is Fat Layer (Subcutaneous Tissue) exposed. There is a medium amount of serosanguineous drainage noted. There is Joyce granulation within the wound bed. There is a large (67-100%) amount of necrotic tissue within the wound bed including Adherent Slough. [name], [name] [place] ([id number]) [id number] Page 6 of 7 Assessment Active Problems ICD-10 Disruption of external operation (surgical) wound, not elsewhere classified, initial encounter Non-pressure chronic ulcer of left ankle with fat layer exposed Basal cell carcinoma of skin, unspecified Laceration without foreign body, right lower leg, initial encounter Chronic atrial fibrillation, unspecified Long term (current) use of anticoagulants Procedures Wound #1 Pre-procedure diagnosis of Wound #1 is an Atypical located on the Left,Medial Ankle . There was a Excisional Skin/Subcutaneous Tissue  Debridement with a total area of 3 sq cm performed by [name]., PA-C. With the following instrument(s): Curette to remove Viable and Non-Viable tissue/material. Material removed includes Subcutaneous Tissue and Slough and after achieving pain control using Lidocaine 4% T opical Solution. Joyce specimens were taken. A time out was conducted at 09:24, prior to the start of the procedure. A Minimum amount of bleeding was controlled with Pressure. The procedure was tolerated well. Post Debridement Measurements: 2cm length x 1.5cm width x 0.2cm depth; 0.471cm^3 volume. Character of Wound/Ulcer Post Debridement is stable. Post procedure Diagnosis Wound #1: Same as Pre-Procedure Pre-procedure diagnosis of Wound #1 is an Atypical located  on the Left,Medial Ankle. A skin graft procedure using a bioengineered skin substitute/cellular or tissue based product was performed by [name]., PA-C with the following instrument(s): Blade, Forceps, and Scissors. Apligraf was applied and secured with Steri-Strips. 11 sq cm of product was utilized and 33 sq cm was wasted. Post Application, was applied. A Time Out was conducted at 09:28, prior to the start of the procedure. The procedure was tolerated well with a pain level of 0 throughout and a pain level of 0 following the procedure. Post procedure Diagnosis Wound #1: Same as Pre-Procedure . Plan Follow-up Appointments: Return Appointment in 1 week. Nurse Visit as needed Bathing/ Shower/ Hygiene: Wound #1 Left,Medial Ankle: Wash wounds with antibacterial soap and water. May shower with wound dressing protected with water repellent cover or cast protector. Joyce tub bath. Anesthetic (Use 'Patient Medications' Section for Anesthetic Order Entry): Lidocaine applied to wound bed Cellular or Tissue Based Products: Wound #1 Left,Medial Ankle: Cellular or Tissue Based Product Type: - Apligraf Cellular or Tissue Based Product applied to wound bed; including contact layer, fixation with steri-strips, dry gauze and cover dressing. (DO NOT REMOVE). - May change outer dressing only Edema Control - Lymphedema / Segmental Compressive Device / Other: Wound #1 Left,Medial Ankle: Patient to wear own compression stockings. Remove compression stockings every night before going to bed and put on every morning when getting up. - Bilat lower legs Elevate, Exercise Daily and Avoid Standing for Long Periods of Time. WOUND #1: - Ankle Wound Laterality: Left, Medial Cleanser: Vashe 5.8 (oz) 3 x Per Week/30 Days Discharge Instructions: Use vashe 5.8 (oz) as directed Prim Dressing: Apligraf 3 x Per Week/30 Days ary Secondary Dressing: (BORDER) Zetuvit Plus SILICONE BORDER Dressing 5x5 (in/in) 3 x Per  Week/30 Days Discharge Instructions: Please do not put silicone bordered dressings under wraps. Use non-bordered dressing only. 1. I would recommend currently that we have the patient continue to monitor for any signs of worsening or infection. Based on what I am seeing currently patient is tolerating the dressing changes without complication and I think she should do well with the Apligraf I am hopeful this will help stimulate tissue growth much more quickly to get this to complete closure obviously that is the goal. 2. I am good recommend she continue to cover this with a Zetuvit dressing this can actually stay on for the week however if she needs to remove it due to issues with drainage she can replace the outer dressing but needs to leave the Steri-Strips and the Adaptic underneath in place. We will see patient back for reevaluation in 1 week here in the clinic. If anything worsens or changes patient will contact our office for additional recommendations. Electronic Signature(s) Signed: 11/28/2022 9:43:08 AM By: Joyce Kelp PA-C Entered By: [name] on 11/28/2022 09:43:08 [name],  [name] ([id number]) [id number] Page 7 of 7 -------------------------------------------------------------------------------- SuperBill Details Patient Name: Date of Service: [name] 11/28/2022 Medical Record Number: [id number] Patient Account Number: [id number] Date of Birth/Sex: Treating RN: 1950-09-17 (72 y.o. [name] Primary Care Provider: Rodrigo Kaufman Other Clinician: Referring Provider: Treating Provider/Extender: [name] in Treatment: 5 Diagnosis Coding ICD-10 Codes Code Description T81.31XA Disruption of external operation (surgical) wound, not elsewhere classified, initial encounter L97.322 Non-pressure chronic ulcer of left ankle with fat layer exposed C44.91 Basal cell carcinoma of skin, unspecified S81.811A  Laceration without foreign body, right lower leg, initial encounter I48.20 Chronic atrial fibrillation, unspecified Z79.01 Long term (current) use of anticoagulants Facility Procedures : CPT4 Code: [id number] Description: [phone] (Facility Use Only) Apligraf 44 SQ CM Modifier: Quantity: 44 : CPT4 Code: [id number] Description: 15271 - SKIN SUB GRAFT TRNK/ARM/LEG ICD-10 Diagnosis Description L97.322 Non-pressure chronic ulcer of left ankle with fat layer exposed Modifier: Quantity: 1 Physician Procedures : CPT4 Code Description Modifier [id number] 15271 - WC PHYS SKIN SUB GRAFT TRNK/ARM/LEG ICD-10 Diagnosis Description L97.322 Non-pressure chronic ulcer of left ankle with fat layer exposed Quantity: 1 Electronic Signature(s) Signed: 11/28/2022 9:43:18 AM By: Joyce Kelp PA-C Entered By: [name] on 11/28/2022 09:43:18

## 2022-11-29 DIAGNOSIS — M25511 Pain in right shoulder: Secondary | ICD-10-CM | POA: Diagnosis not present

## 2022-11-29 DIAGNOSIS — L648 Other androgenic alopecia: Secondary | ICD-10-CM | POA: Diagnosis not present

## 2022-11-29 DIAGNOSIS — M545 Low back pain, unspecified: Secondary | ICD-10-CM | POA: Diagnosis not present

## 2022-11-29 DIAGNOSIS — L650 Telogen effluvium: Secondary | ICD-10-CM | POA: Diagnosis not present

## 2022-11-29 DIAGNOSIS — R293 Abnormal posture: Secondary | ICD-10-CM | POA: Diagnosis not present

## 2022-11-29 DIAGNOSIS — M6281 Muscle weakness (generalized): Secondary | ICD-10-CM | POA: Diagnosis not present

## 2022-11-29 NOTE — Progress Notes (Signed)
[name], [name] [place] ([id number]) [id number] Page 1 of 7 Visit Report for 11/28/2022 Arrival Information Details Patient Name: Date of Service: NO RMA [name] 11/28/2022 8:30 A M Medical Record Number: [id number] Patient Account Number: [id number] Date of Birth/Sex: Treating RN: [birth year] (72 y.o. [name] Primary Care Jannine Abreu: Rodrigo Ran Other Clinician: Referring Mahonri Seiden: Treating Cristiano Capri/Extender: Kerry Hough in Treatment: 5 Visit Information History Since Last Visit Added or deleted any medications: No Patient Arrived: Ambulatory Has Dressing in Place as Prescribed: Yes Arrival Time: 08:34 Pain Present Now: No Accompanied By: self Transfer Assistance: None Patient Identification Verified: Yes Secondary Verification Process Completed: Yes Patient Requires Transmission-Based Precautions: No Patient Has Alerts: Yes Patient Alerts: Patient on Blood Thinner Eliquis//ASA Not Diabetic Electronic Signature(s) Signed: 11/28/2022 5:03:29 PM By: Midge Aver MSN RN CNS WTA Entered By: Midge Aver on 11/28/2022 08:36:34 -------------------------------------------------------------------------------- Clinic Level of Care Assessment Details Patient Name: Date of Service: NO RMA [name] 11/28/2022 8:30 A M Medical Record Number: [id number] Patient Account Number: [id number] Date of Birth/Sex: Treating RN: 28-Nov-1950 (72 y.o. [name] Primary Care Alesa Echevarria: Rodrigo Ran Other Clinician: Referring Aashi Derrington: Treating Bexley Mclester/Extender: Kerry Hough in Treatment: 5 Clinic Level of Care Assessment Items TOOL 1 Quantity Score []  - 0 Use when EandM and Procedure is performed on INITIAL visit ASSESSMENTS - Nursing Assessment / Reassessment []  - 0 General Physical Exam (combine w/ comprehensive assessment (listed just below) when performed on new pt. evals) []  - 0 Comprehensive Assessment (HX,  ROS, Risk Assessments, Wounds Hx, etc.) ASSESSMENTS - Wound and Skin Assessment / Reassessment []  - 0 Dermatologic / Skin Assessment (not related to wound area) ASSESSMENTS - Ostomy and/or Continence Assessment and Care []  - 0 Incontinence Assessment and Management []  - 0 Ostomy Care Assessment and Management (repouching, etc.) PROCESS - Coordination of Care []  - 0 Simple Patient / Family Education for ongoing care []  - 0 Complex (extensive) Patient / Family Education for ongoing care []  - 0 Staff obtains Chiropractor, Records, T Results / Process Orders est []  - 0 Staff telephones HHA, Nursing Homes / Clarify orders / etc []  - 0 Routine Transfer to another Facility (non-emergent condition) []  - 0 Routine Hospital Admission (non-emergent condition) []  - 0 New Admissions / Insurance Authorizations / Ordering NPWT Apligraf, etc. , []  - 0 Emergency Hospital Admission (emergent condition) GEISHA, BOGENSCHUTZ ([id number]) [id number] Page 2 of 7 PROCESS - Special Needs []  - 0 Pediatric / Minor Patient Management []  - 0 Isolation Patient Management []  - 0 Hearing / Language / Visual special needs []  - 0 Assessment of Community assistance (transportation, D/C planning, etc.) []  - 0 Additional assistance / Altered mentation []  - 0 Support Surface(s) Assessment (bed, cushion, seat, etc.) INTERVENTIONS - Miscellaneous []  - 0 External ear exam []  - 0 Patient Transfer (multiple staff / Nurse, adult / Similar devices) []  - 0 Simple Staple / Suture removal (25 or less) []  - 0 Complex Staple / Suture removal (26 or more) []  - 0 Hypo/Hyperglycemic Management (do not check if billed separately) []  - 0 Ankle / Brachial Index (ABI) - do not check if billed separately Has the patient been seen at the hospital within the last three years: Yes Total Score: 0 Level Of Care: ____ Electronic Signature(s) Signed: 11/28/2022 5:03:29 PM By: Midge Aver MSN RN CNS  WTA Entered By: Midge Aver on 11/28/2022 09:34:30 -------------------------------------------------------------------------------- Encounter Discharge Information Details Patient Name: Date of Service: NO  RMA [name] 11/28/2022 8:30 A M Medical Record Number: [id number] Patient Account Number: [id number] Date of Birth/Sex: Treating RN: 1950-10-25 (72 y.o. [name] Primary Care Aerin Delany: Rodrigo Ran Other Clinician: Referring Anice Wilshire: Treating Adarsh Mundorf/Extender: Kerry Hough in Treatment: 5 Encounter Discharge Information Items Post Procedure Vitals Discharge Condition: Stable Temperature (F): 97.8 Ambulatory Status: Ambulatory Pulse (bpm): 72 Discharge Destination: Home Respiratory Rate (breaths/min): 16 Transportation: Private Auto Blood Pressure (mmHg): 129/80 Accompanied By: self Schedule Follow-up Appointment: Yes Clinical Summary of Care: Electronic Signature(s) Signed: 11/28/2022 5:03:29 PM By: Midge Aver MSN RN CNS WTA Entered By: Midge Aver on 11/28/2022 09:44:45 -------------------------------------------------------------------------------- Lower Extremity Assessment Details Patient Name: Date of Service: NO RMA [name] 11/28/2022 8:30 A M Medical Record Number: [id number] Patient Account Number: [id number] Date of Birth/Sex: Treating RN: 1951/02/14 (72 y.o. [name] Primary Care Anylah Scheib: Rodrigo Ran Other Clinician: Referring Colsen Modi: Treating Ether Goebel/Extender: Kerry Hough in Treatment: 5 Edema Assessment Assessed: [Left: No] Franne Forts: No] [Left: Edema] Franne Forts: :] N[LeftAJANA, DITSWORTH ([id number])] [Right: [id number] Page 3 of 7] Calf Left: Right: Point of Measurement: 33 cm From Medial Instep 34.8 cm Ankle Left: Right: Point of Measurement: 12 cm From Medial Instep 22 cm Knee To Floor Left: Right: From Medial Instep 41 cm Vascular  Assessment Pulses: Dorsalis Pedis Palpable: [Left:Yes] Electronic Signature(s) Signed: 11/28/2022 5:03:29 PM By: Midge Aver MSN RN CNS WTA Entered By: Midge Aver on 11/28/2022 08:49:32 -------------------------------------------------------------------------------- Multi Wound Chart Details Patient Name: Date of Service: NO RMA [name] 11/28/2022 8:30 A M Medical Record Number: [id number] Patient Account Number: [id number] Date of Birth/Sex: Treating RN: March 15, 1951 (72 y.o. [name] Primary Care Chelcy Bolda: Rodrigo Ran Other Clinician: Referring Angeliah Wisdom: Treating Kern Gingras/Extender: Kerry Hough in Treatment: 5 Vital Signs Height(in): 61 Pulse(bpm): 72 Weight(lbs): 160 Blood Pressure(mmHg): 129/80 Body Mass Index(BMI): 30.2 Temperature(F): 97.8 Respiratory Rate(breaths/min): 16 [1:Photos:] [N/A:N/A] Left, Medial Ankle N/A N/A Wound Location: Surgical Injury N/A N/A Wounding Event: Atypical N/A N/A Primary Etiology: Hypertension, Neuropathy N/A N/A Comorbid History: 09/23/2022 N/A N/A Date Acquired: 5 N/A N/A Weeks of Treatment: Open N/A N/A Wound Status: No N/A N/A Wound Recurrence: 2x1.5x0.1 N/A N/A Measurements L x W x D (cm) 2.356 N/A N/A A (cm) : rea 0.236 N/A N/A Volume (cm) : 6.20% N/A N/A % Reduction in A rea: 53.10% N/A N/A % Reduction in Volume: Full Thickness Without Exposed N/A N/A Classification: Support Structures Medium N/A N/A Exudate Amount: Serosanguineous N/A N/A Exudate Type: red, brown N/A N/A Exudate Color: None Present (0%) N/A N/A Granulation Amount: Large (67-100%) N/A N/A Necrotic Amount: Fat Layer (Subcutaneous Tissue): Yes N/A N/A Exposed Structures: Fascia: No [name], [name] ([id number]) [id number] Page 4 of 7 Tendon: No Muscle: No Joint: No Bone: No None N/A N/A Epithelialization: Treatment Notes Electronic Signature(s) Signed: 11/28/2022 5:03:29  PM By: Midge Aver MSN RN CNS WTA Entered By: Midge Aver on 11/28/2022 09:24:26 -------------------------------------------------------------------------------- Multi-Disciplinary Care Plan Details Patient Name: Date of Service: NO RMA [name] 11/28/2022 8:30 A M Medical Record Number: [id number] Patient Account Number: [id number] Date of Birth/Sex: Treating RN: [birth year] (72 y.o. [name] Primary Care Siyona Coto: Rodrigo Ran Other Clinician: Referring Myrka Sylva: Treating Jaleena Viviani/Extender: Kerry Hough in Treatment: 5 Active Inactive Necrotic Tissue Nursing Diagnoses: Impaired tissue integrity related to necrotic/devitalized tissue Knowledge deficit related to management of necrotic/devitalized tissue Goals: Necrotic/devitalized tissue will be minimized in the wound bed  Date Initiated: 10/20/2022 Target Resolution Date: 12/22/2022 Goal Status: Active Patient/caregiver will verbalize understanding of reason and process for debridement of necrotic tissue Date Initiated: 10/20/2022 Date Inactivated: 11/28/2022 Target Resolution Date: 11/20/2022 Goal Status: Met Interventions: Assess patient pain level pre-, during and post procedure and prior to discharge Provide education on necrotic tissue and debridement process Treatment Activities: Apply topical anesthetic as ordered : 10/18/2022 Notes: Electronic Signature(s) Signed: 11/28/2022 5:03:29 PM By: Midge AverSmith, Vicki MSN RN CNS WTA Entered By: Midge AverSmith, Vicki on 11/28/2022 09:43:19 -------------------------------------------------------------------------------- Pain Assessment Details Patient Name: Date of Service: NO RMA Joyce Legato, [name] 11/28/2022 8:30 A M Medical Record Number: [id number] Patient Account Number: [id number] Date of Birth/Sex: Treating RN: 1951-07-29 (72 y.o. [name]) Smith, Vicki Primary Care Khaleed Holan: Rodrigo RanPerini, Mark Other Clinician: Referring Yossef Gilkison: Treating Toni Hoffmeister/Extender: Kerry HoughStone, Hoyt Perini,  Mark Weeks in Treatment: 5 Active Problems Location of Pain Severity and Description of Pain Patient Has Paino No Site [place] ([id number]) [id number] Page 5 of 7 Pain Management and Medication Current Pain Management: Electronic Signature(s) Signed: 11/28/2022 5:03:29 PM By: Midge AverSmith, Vicki MSN RN CNS WTA Entered By: Midge AverSmith, Vicki on 11/28/2022 08:44:20 -------------------------------------------------------------------------------- Patient/Caregiver Education Details Patient Name: Date of Service: NO RMA Thurmon FairN, [name] 4/9/2024andnbsp8:30 A M Medical Record Number: [id number] Patient Account Number: [id number] Date of Birth/Gender: Treating RN: 1951-07-29 (72 y.o. [name]) Smith, Vicki Primary Care Physician: Rodrigo RanPerini, Mark Other Clinician: Referring Physician: Treating Physician/Extender: Kerry HoughStone, Hoyt Perini, Mark Weeks in Treatment: 5 Education Assessment Education Provided To: Patient Education Topics Provided Wound/Skin Impairment: Handouts: Caring for Your Ulcer Methods: Explain/Verbal Responses: State content correctly Electronic Signature(s) Signed: 11/28/2022 5:03:29 PM By: Midge AverSmith, Vicki MSN RN CNS WTA Entered By: Midge AverSmith, Vicki on 11/28/2022 09:43:41 -------------------------------------------------------------------------------- Wound Assessment Details Patient Name: Date of Service: NO RMA Joyce Legato, [name] 11/28/2022 8:30 A M Medical Record Number: [id number] Patient Account Number: [id number] Date of Birth/Sex: Treating RN: 1951-07-29 (72 y.o. [name]) Smith, Vicki Primary Care Ysidro Ramsay: Rodrigo RanPerini, Mark Other Clinician: Referring Raeshawn Vo: Treating Rebekah Zackery/Extender: Kerry HoughStone, Hoyt Perini, Mark Weeks in Treatment: 5 Wound Status Wound Number: 1 Primary Etiology: Atypical Wound Location: Left, Medial Ankle Wound Status: Open Wounding Event: Surgical Injury Comorbid History: Hypertension, Neuropathy Date [facility]: [facility]  [facility] [address] [place], [place] ([id number]) [id number] Page 6 of 7 Clustered Wound: No Photos Wound Measurements Length: (cm) 2 Width: (cm) 1.5 Depth: (cm) 0.1 Area: (cm) 2.356 Volume: (cm) 0.236 % Reduction in Area: 6.2% % Reduction in Volume: 53.1% Epithelialization: None Wound Description Classification: Full Thickness Without Exposed Support Exudate Amount: Medium Exudate Type: Serosanguineous Exudate Color: red, brown Structures Foul Odor After Cleansing: No Slough/Fibrino Yes Wound Bed Granulation Amount: None Present (0%) Exposed Structure Necrotic Amount: Large (67-100%) Fascia Exposed: No Necrotic Quality: Adherent Slough Fat Layer (Subcutaneous Tissue) Exposed: Yes Tendon Exposed: No Muscle Exposed: No Joint Exposed: No Bone Exposed: No Treatment Notes Wound #1 (Ankle) Wound Laterality: Left, Medial Cleanser Vashe 5.8 (oz) Discharge Instruction: Use vashe 5.8 (oz) as directed Peri-Wound Care Topical Primary Dressing Apligraf Secondary Dressing Secured With Compression Wrap Compression Stockings Add-Ons Electronic Signature(s) Signed: 11/28/2022 5:03:29 PM By: Midge AverSmith, Vicki MSN RN CNS WTA Entered By: Midge AverSmith, Vicki on 11/28/2022 08:48:07 -------------------------------------------------------------------------------- Vitals Details Patient Name: Date of Service: NO RMA [name], [name] 11/28/2022 8:30 A M Medical Record Number: [id number] Patient Account Number: [id number] Date of Birth/Sex: Treating RN: 1951-07-29 (72 y.o. [name]) Smith, Vicki Primary Care Estephania Licciardi: Rodrigo RanPerini, Mark Other Clinician: George HughORMAN, [name] ([id number]) [id number] Page 7 of 7  Referring Reva Pinkley: Treating Peretz Thieme/Extender: Kerry Hough in Treatment: 5 Vital Signs Time Taken: 08:40 Temperature (F): 97.8 Height (in): 61 Pulse (bpm): 72 Weight (lbs): 160 Respiratory Rate (breaths/min): 16 Body Mass Index  (BMI): 30.2 Blood Pressure (mmHg): 129/80 Reference Range: 80 - 120 mg / dl Electronic Signature(s) Signed: 11/28/2022 5:03:29 PM By: Midge Aver MSN RN CNS WTA Entered By: Midge Aver on 11/28/2022 08:44:05

## 2022-12-04 ENCOUNTER — Other Ambulatory Visit (HOSPITAL_BASED_OUTPATIENT_CLINIC_OR_DEPARTMENT_OTHER): Payer: Self-pay

## 2022-12-04 DIAGNOSIS — R293 Abnormal posture: Secondary | ICD-10-CM | POA: Diagnosis not present

## 2022-12-04 DIAGNOSIS — M25511 Pain in right shoulder: Secondary | ICD-10-CM | POA: Diagnosis not present

## 2022-12-04 DIAGNOSIS — M6281 Muscle weakness (generalized): Secondary | ICD-10-CM | POA: Diagnosis not present

## 2022-12-04 DIAGNOSIS — M545 Low back pain, unspecified: Secondary | ICD-10-CM | POA: Diagnosis not present

## 2022-12-04 MED ORDER — HYDROMORPHONE HCL 2 MG PO TABS
1.0000 mg | ORAL_TABLET | Freq: Four times a day (QID) | ORAL | 0 refills | Status: AC | PRN
  Filled 2022-12-04: qty 10, 5d supply, fill #0

## 2022-12-05 ENCOUNTER — Encounter: Payer: Medicare Other | Admitting: Physician Assistant

## 2022-12-05 ENCOUNTER — Ambulatory Visit: Payer: Medicare Other

## 2022-12-05 DIAGNOSIS — L97322 Non-pressure chronic ulcer of left ankle with fat layer exposed: Secondary | ICD-10-CM | POA: Diagnosis not present

## 2022-12-05 DIAGNOSIS — G8929 Other chronic pain: Secondary | ICD-10-CM | POA: Diagnosis not present

## 2022-12-05 DIAGNOSIS — M313 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: Secondary | ICD-10-CM | POA: Diagnosis not present

## 2022-12-05 DIAGNOSIS — Z7901 Long term (current) use of anticoagulants: Secondary | ICD-10-CM | POA: Diagnosis not present

## 2022-12-05 DIAGNOSIS — S91002A Unspecified open wound, left ankle, initial encounter: Secondary | ICD-10-CM | POA: Diagnosis not present

## 2022-12-05 DIAGNOSIS — I482 Chronic atrial fibrillation, unspecified: Secondary | ICD-10-CM | POA: Diagnosis not present

## 2022-12-05 DIAGNOSIS — I10 Essential (primary) hypertension: Secondary | ICD-10-CM | POA: Diagnosis not present

## 2022-12-05 NOTE — Progress Notes (Signed)
AUSET, FRITZLER Juarez ([id number]) [id number] Page 1 of 7 Visit Report for 12/05/2022 Chief Complaint Document Details Patient Name: Date of Service: NO RMA Stan Head [name] 12/05/2022 8:30 A M Medical Record Number: [id number] Patient Account Number: [id number] Date of Birth/Sex: Treating RN: Nov 22, 1950 (72 y.o. [name] Primary Care Provider: Rodrigo Ran Other Clinician: Referring Provider: Treating Provider/Extender: Kerry Hough in Treatment: 6 Information Obtained from: Patient Chief Complaint 10/18/2022; left lower extremity wound status post [facility] removal with mohs surgery, right lower extremity skin tear from fall Electronic Signature(s) Signed: 12/05/2022 8:50:57 AM By: Allen Derry PA-C Entered By: Allen Derry on 12/05/2022 08:50:57 -------------------------------------------------------------------------------- Cellular or Tissue Based Product Details Patient Name: Date of Service: NO RMA Stan Head [name] 12/05/2022 8:30 A M Medical Record Number: [id number] Patient Account Number: [id number] Date of Birth/Sex: Treating RN: 02-15-1951 (72 y.o. [name] Primary Care Provider: Rodrigo Ran Other Clinician: Referring Provider: Treating Provider/Extender: Kerry Hough in Treatment: 6 Cellular or Tissue Based Product Type Wound #1 Left,Medial Ankle Applied to: Performed By: Physician Allen Derry, PA-C Cellular or Tissue Based Product Type: Apligraf Level of Consciousness (Pre-procedure): Awake and Alert Pre-procedure Verification/Time Out Yes - 09:15 Taken: Location: trunk / arms / legs Wound Size (sq cm): 1.43 Product Size (sq cm): 44 Waste Size (sq cm): 33 Waste Reason: smaller wound Amount of Product Applied (sq cm): 11 Instrument Used: Blade, Forceps, Scissors Lot #: [id number] Expiration Date: 12/12/2022 Fenestrated: Yes Instrument: Blade Secured: Yes Secured With:  Steri-Strips Dressing Applied: Yes Primary Dressing: sorbact abd zetuvit Procedural Pain: 0 Post Procedural Pain: 0 Response to Treatment: Procedure was tolerated well Level of Consciousness (Post- Awake and Alert procedure): Post Procedure Diagnosis Same as Pre-procedure Electronic Signature(s) Unsigned Entered By: Midge Aver on 12/05/2022 09:16:59 Signature(s): MAVA, SUARES ([id number]) [id number] Date(s): [id number] Page 2 of 7 -------------------------------------------------------------------------------- Debridement Details Patient Name: Date of Service: NO RMA Stan Head [name] 12/05/2022 8:30 A M Medical Record Number: [id number] Patient Account Number: [id number] Date of Birth/Sex: Treating RN: 1951/06/15 (72 y.o. [name] Primary Care Provider: Rodrigo Ran Other Clinician: Referring Provider: Treating Provider/Extender: Kerry Hough in Treatment: 6 Debridement Performed for Assessment: Wound #1 Left,Medial Ankle Performed By: Physician Allen Derry, PA-C Debridement Type: Debridement Level of Consciousness (Pre-procedure): Awake and Alert Pre-procedure Verification/Time Out Yes - 09:12 Taken: Start Time: 09:12 Pain Control: Lidocaine 4% T opical Solution T Area Debrided (L x W): otal 1.3 (cm) x 1.1 (cm) = 1.43 (cm) Tissue and other material debrided: Viable, Non-Viable, Slough, Subcutaneous, Slough Level: Skin/Subcutaneous Tissue Debridement Description: Excisional Instrument: Curette Bleeding: Minimum Hemostasis Achieved: Pressure Procedural Pain: 0 Post Procedural Pain: 0 Response to Treatment: Procedure was tolerated well Level of Consciousness (Post- Awake and Alert procedure): Post Debridement Measurements of Total Wound Length: (cm) 1.3 Width: (cm) 1.1 Depth: (cm) 0.2 Volume: (cm) 0.225 Character of Wound/Ulcer Post Debridement: Stable Post Procedure Diagnosis Same as Pre-procedure Electronic  Signature(s) Unsigned Entered By: Midge Aver on 12/05/2022 09:13:31 -------------------------------------------------------------------------------- HPI Details Patient Name: Date of Service: NO RMA Stan Head [name] 12/05/2022 8:30 A M Medical Record Number: [id number] Patient Account Number: [id number] Date of Birth/Sex: Treating RN: 04-18-1951 (72 y.o. [name] Primary Care Provider: Rodrigo Ran Other Clinician: Referring Provider: Treating Provider/Extender: Kerry Hough in Treatment: 6 History of Present Illness HPI Description: 10/18/2022 Ms. Lethia Donlon is a 72 year old female with a past medical history of A-fib on Eliquis,  Wegener's granulomatosis with vasculitis, and basal cell carcinoma of the left leg that presents the clinic for a wound to the left leg status post Mohs procedure for basal cell carcinoma removal and skin tear to the right lower extremity that occurred 3 days ago after a fall. She has been using Vaseline to the wound beds. She has compression stockings and has been using these. She currently has chronic pain to the left leg wound site with slight erythema and warmth to the periwound. 3/6; patient presents for follow-up. She has been using Medihoney and Hydrofera Blue to the left lower extremity wound and Xeroform to the right lower extremity wound. She denies signs of infection. She completed her oral antibiotics. 11-07-2022 upon evaluation today patient appears to be doing well currently in regard to her wound. This actually did require some sharp debridement to clearway the necrotic debris. She actually tolerated that debridement today well. 11-14-2022 upon evaluation today patient appears to be doing well currently in regard to her wound. There is some slough and biofilm noted on the surface of the wound currently but fortunately nothing that appears to be too significant. I do think she is going to require some sharp debridement  today. Fortunately I do not see any evidence of infection locally or systemically at this point. MARIPOSA, SHORES [place] ([id number]) [id number] Page 3 of 7 11-21-2022 upon evaluation today patient appears to be doing well currently in regard to her wound although it is slow to heal she is making some slight progress. I think we should consider going forward with a skin sub-I discussed that with her today I think Apligraf would be a good idea. 11-28-2022 upon evaluation today patient is here today for her first application of Apligraf which she has actually been doing very well with. Fortunately there does not appear to be any signs of active infection at this time. 12-05-2022 upon evaluation today patient actually appears to be doing excellent in regard to her wound this is showing signs of good improvement I am very pleased in this regard. Fortunately I do not see any signs of active infection locally nor systemically which is great news. Electronic Signature(s) Signed: 12/05/2022 9:51:00 AM By: Allen Derry PA-C Entered By: Allen Derry on 12/05/2022 09:51:00 -------------------------------------------------------------------------------- Physical Exam Details Patient Name: Date of Service: NO RMA Stan Head [name] 12/05/2022 8:30 A M Medical Record Number: [id number] Patient Account Number: [id number] Date of Birth/Sex: Treating RN: 1951/02/21 (72 y.o. [name] Primary Care Provider: Rodrigo Ran Other Clinician: Referring Provider: Treating Provider/Extender: Kerry Hough in Treatment: 6 Constitutional Well-nourished and well-hydrated in no acute distress. Respiratory normal breathing without difficulty. Psychiatric this patient is able to make decisions and demonstrates good insight into disease process. Alert and Oriented x 3. pleasant and cooperative. Notes Upon inspection patient's wound bed actually showed signs of good granulation  epithelization at this point. Fortunately I do not see any signs of active infection locally nor systemically which is great news and overall I am extremely pleased with where we stand currently. Electronic Signature(s) Signed: 12/05/2022 9:51:25 AM By: Allen Derry PA-C Entered By: Allen Derry on 12/05/2022 09:51:25 -------------------------------------------------------------------------------- Physician Orders Details Patient Name: Date of Service: NO RMA Winona Legato JA [name] 12/05/2022 8:30 A M Medical Record Number: [id number] Patient Account Number: [id number] Date of Birth/Sex: Treating RN: 12-04-1950 (72 y.o. [name] Primary Care Provider: Rodrigo Ran Other Clinician: Referring Provider: Treating Provider/Extender: Kerry Hough in Treatment: 6 Verbal /  Phone Orders: No Diagnosis Coding ICD-10 Coding Code Description T81.31XA Disruption of external operation (surgical) wound, not elsewhere classified, initial encounter L97.322 Non-pressure chronic ulcer of left ankle with fat layer exposed C44.91 Basal cell carcinoma of skin, unspecified S81.811A Laceration without foreign body, right lower leg, initial encounter I48.20 Chronic atrial fibrillation, unspecified Z79.01 Long term (current) use of anticoagulants Follow-up Appointments Return Appointment in 1 week. Nurse Visit as needed [facility] Shower/ Hygiene Wound #1 Left,Medial Ankle SEVANNA, BALLENGEE ([id number]) [id number] Page 4 of 7 Wash wounds with antibacterial soap and water. May shower with wound dressing protected with water repellent cover or cast protector. No tub bath. Anesthetic (Use 'Patient Medications' Section for Anesthetic Order Entry) Lidocaine applied to wound bed Cellular or Tissue Based Products Wound #1 Left,Medial Ankle Cellular or Tissue Based Product Type: - Apligraf Cellular or Tissue Based Product applied to wound bed; including contact layer,  fixation with steri-strips, dry gauze and cover dressing. (DO NOT REMOVE). - May change outer dressing only Edema Control - Lymphedema / Segmental Compressive Device / Other Wound #1 Left,Medial Ankle Patient to wear own compression stockings. Remove compression stockings every night before going to bed and put on every morning when getting up. - Bilat lower legs Elevate, Exercise Daily and A void Standing for Long Periods of Time. Wound Treatment Wound #1 - Ankle Wound Laterality: Left, Medial Cleanser: Vashe 5.8 (oz) 1 x Per Week/30 Days Discharge Instructions: Use vashe 5.8 (oz) as directed Prim Dressing: Apligraf ary 1 x Per Week/30 Days Secondary Dressing: (BORDER) Zetuvit Plus SILICONE BORDER Dressing 5x5 (in/in) 1 x Per Week/30 Days Discharge Instructions: Please do not put silicone bordered dressings under wraps. Use non-bordered dressing only. Secondary Dressing: Sorbact 1 x Per Week/30 Days Discharge Instructions: In place of contact layer Secured With: Steri-Strip 0.25x4 (in/in) 1 x Per Week/30 Days Discharge Instructions: Apply Steri-Strip as directed Electronic Signature(s) Unsigned Entered By: Midge Aver on 12/05/2022 09:55:52 -------------------------------------------------------------------------------- Problem List Details Patient Name: Date of Service: NO RMA Stan Head [name] 12/05/2022 8:30 A M Medical Record Number: [id number] Patient Account Number: [id number] Date of Birth/Sex: Treating RN: 01/22/1951 (72 y.o. [name] Primary Care Provider: Rodrigo Ran Other Clinician: Referring Provider: Treating Provider/Extender: Kerry Hough in Treatment: 6 Active Problems ICD-10 Encounter Code Description Active Date MDM Diagnosis T81.31XA Disruption of external operation (surgical) wound, not elsewhere classified, 10/18/2022 No Yes initial encounter L97.322 Non-pressure chronic ulcer of left ankle with fat layer exposed 10/18/2022 No  Yes C44.91 Basal cell carcinoma of skin, unspecified 10/18/2022 No Yes S81.811A Laceration without foreign body, right lower leg, initial encounter 10/18/2022 No Yes EMIYA, LOOMER ([id number]) [id number] Page 5 of 7 I48.20 Chronic atrial fibrillation, unspecified 10/18/2022 No Yes Z79.01 Long term (current) use of anticoagulants 10/18/2022 No Yes Inactive Problems Resolved Problems Electronic Signature(s) Signed: 12/05/2022 8:50:54 AM By: Allen Derry PA-C Entered By: Allen Derry on 12/05/2022 08:50:54 -------------------------------------------------------------------------------- Progress Note Details Patient Name: Date of Service: NO RMA Stan Head [name] 12/05/2022 8:30 A M Medical Record Number: [id number] Patient Account Number: [id number] Date of Birth/Sex: Treating RN: Aug 21, 1951 (72 y.o. [name] Primary Care Provider: Rodrigo Ran Other Clinician: Referring Provider: Treating Provider/Extender: Kerry Hough in Treatment: 6 Subjective Chief Complaint Information obtained from Patient 10/18/2022; left lower extremity wound status post [facility] removal with mohs surgery, right lower extremity skin tear from fall History of Present Illness (HPI) 10/18/2022 Ms. Marcella Charlson is a 72 year old female with a past  medical history of A-fib on Eliquis, Wegener's granulomatosis with vasculitis, and basal cell carcinoma of the left leg that presents the clinic for a wound to the left leg status post Mohs procedure for basal cell carcinoma removal and skin tear to the right lower extremity that occurred 3 days ago after a fall. She has been using Vaseline to the wound beds. She has compression stockings and has been using these. She currently has chronic pain to the left leg wound site with slight erythema and warmth to the periwound. 3/6; patient presents for follow-up. She has been using Medihoney and Hydrofera Blue to the left lower  extremity wound and Xeroform to the right lower extremity wound. She denies signs of infection. She completed her oral antibiotics. 11-07-2022 upon evaluation today patient appears to be doing well currently in regard to her wound. This actually did require some sharp debridement to clearway the necrotic debris. She actually tolerated that debridement today well. 11-14-2022 upon evaluation today patient appears to be doing well currently in regard to her wound. There is some slough and biofilm noted on the surface of the wound currently but fortunately nothing that appears to be too significant. I do think she is going to require some sharp debridement today. Fortunately I do not see any evidence of infection locally or systemically at this point. 11-21-2022 upon evaluation today patient appears to be doing well currently in regard to her wound although it is slow to heal she is making some slight progress. I think we should consider going forward with a skin sub-I discussed that with her today I think Apligraf would be a good idea. 11-28-2022 upon evaluation today patient is here today for her first application of Apligraf which she has actually been doing very well with. Fortunately there does not appear to be any signs of active infection at this time. 12-05-2022 upon evaluation today patient actually appears to be doing excellent in regard to her wound this is showing signs of good improvement I am very pleased in this regard. Fortunately I do not see any signs of active infection locally nor systemically which is great news. Objective Constitutional Well-nourished and well-hydrated in no acute distress. Vitals Time Taken: 8:44 AM, Height: 61 in, Weight: 160 lbs, BMI: 30.2, Temperature: 98.1 F, Pulse: 66 bpm, Respiratory Rate: 16 breaths/min, Blood Pressure: 124/69 mmHg. VALEREE, LEIDY [place] ([id number]) [id number] Page 6 of 7 Respiratory normal breathing without  difficulty. Psychiatric this patient is able to make decisions and demonstrates good insight into disease process. Alert and Oriented x 3. pleasant and cooperative. General Notes: Upon inspection patient's wound bed actually showed signs of good granulation epithelization at this point. Fortunately I do not see any signs of active infection locally nor systemically which is great news and overall I am extremely pleased with where we stand currently. Integumentary (Hair, Skin) Wound #1 status is Open. Original cause of wound was Surgical Injury. The date acquired was: 09/23/2022. The wound has been in treatment 6 weeks. The wound is located on the Left,Medial Ankle. The wound measures 1.3cm length x 1.1cm width x 0.1cm depth; 1.123cm^2 area and 0.112cm^3 volume. There is Fat Layer (Subcutaneous Tissue) exposed. There is a medium amount of serosanguineous drainage noted. There is no granulation within the wound bed. There is a large (67-100%) amount of necrotic tissue within the wound bed including Adherent Slough. Assessment Active Problems ICD-10 Disruption of external operation (surgical) wound, not elsewhere classified, initial encounter Non-pressure chronic ulcer of left ankle with  fat layer exposed Basal cell carcinoma of skin, unspecified Laceration without foreign body, right lower leg, initial encounter Chronic atrial fibrillation, unspecified Long term (current) use of anticoagulants Procedures Wound #1 Pre-procedure diagnosis of Wound #1 is an Atypical located on the Left,Medial Ankle . There was a Excisional Skin/Subcutaneous Tissue Debridement with a total area of 1.43 sq cm performed by Allen Derry, PA-C. With the following instrument(s): Curette to remove Viable and Non-Viable tissue/material. Material removed includes Subcutaneous Tissue and Slough and after achieving pain control using Lidocaine 4% T opical Solution. No specimens were taken. A time out was conducted at 09:12,  prior to the start of the procedure. A Minimum amount of bleeding was controlled with Pressure. The procedure was tolerated well with a pain level of 0 throughout and a pain level of 0 following the procedure. Post Debridement Measurements: 1.3cm length x 1.1cm width x 0.2cm depth; 0.225cm^3 volume. Character of Wound/Ulcer Post Debridement is stable. Post procedure Diagnosis Wound #1: Same as Pre-Procedure Pre-procedure diagnosis of Wound #1 is an Atypical located on the Left,Medial Ankle. A skin graft procedure using a bioengineered skin substitute/cellular or tissue based product was performed by Allen Derry, PA-C with the following instrument(s): Blade, Forceps, and Scissors. Apligraf was applied and secured with Steri-Strips. 11 sq cm of product was utilized and 33 sq cm was wasted due to smaller wound. Post Application, sorbact abd zetuvit was applied. A Time Out was conducted at 09:15, prior to the start of the procedure. The procedure was tolerated well with a pain level of 0 throughout and a pain level of 0 following the procedure. Post procedure Diagnosis Wound #1: Same as Pre-Procedure . Plan Follow-up Appointments: Return Appointment in 1 week. Nurse Visit as needed Bathing/ Shower/ Hygiene: Wound #1 Left,Medial Ankle: Wash wounds with antibacterial soap and water. May shower with wound dressing protected with water repellent cover or cast protector. No tub bath. Anesthetic (Use 'Patient Medications' Section for Anesthetic Order Entry): Lidocaine applied to wound bed Cellular or Tissue Based Products: Wound #1 Left,Medial Ankle: Cellular or Tissue Based Product Type: - Apligraf Cellular or Tissue Based Product applied to wound bed; including contact layer, fixation with steri-strips, dry gauze and cover dressing. (DO NOT REMOVE). - May change outer dressing only Edema Control - Lymphedema / Segmental Compressive Device / Other: Wound #1 Left,Medial Ankle: Patient to wear own  compression stockings. Remove compression stockings every night before going to bed and put on every morning when getting up. - Bilat lower legs Elevate, Exercise Daily and Avoid Standing for Long Periods of Time. WOUND #1: - Ankle Wound Laterality: Left, Medial Cleanser: Vashe 5.8 (oz) 1 x Per Week/30 Days Discharge Instructions: Use vashe 5.8 (oz) as directed Prim Dressing: Apligraf 1 x Per Week/30 Days ary Secondary Dressing: (BORDER) Zetuvit Plus SILICONE BORDER Dressing 5x5 (in/in) 1 x Per Week/30 Days SHLONDA, DOLLOFF ([id number]) [id number] Page 7 of 7 Discharge Instructions: Please do not put silicone bordered dressings under wraps. Use non-bordered dressing only. Secondary Dressing: Sorbact 1 x Per Week/30 Days Discharge Instructions: In place of contact layer Secured With: Steri-Strip 0.25x4 (in/in) 1 x Per Week/30 Days Discharge Instructions: Apply Steri-Strip as directed 1. I would recommend currently that we have the patient continue to monitor for any signs of infection or worsening. Based on what I am seeing I think she is doing well with the Apligraf I did perform a light debridement then reapply the Apligraf today. 2. I am good recommend that we actually use  Sorbact over top instead of the Mepitel. She is in agreement with the plan. 3. I am going to continue as well to utilize a border foam dressing to cover which she can change as needed although if it is controlling the drainage and does not seem to be filling out that she may not even need to change any sooner. We will see patient back for reevaluation in 1 week here in the clinic. If anything worsens or changes patient will contact our office for additional recommendations. Electronic Signature(s) Signed: 12/05/2022 9:57:00 AM By: Allen Derry PA-C Entered By: Allen Derry on 12/05/2022 09:57:00 -------------------------------------------------------------------------------- SuperBill  Details Patient Name: Date of Service: NO RMA Winona Legato JA [name] 12/05/2022 Medical Record Number: [id number] Patient Account Number: [id number] Date of Birth/Sex: Treating RN: 1951-02-29 (72 y.o. [name] Primary Care Provider: Rodrigo Ran Other Clinician: Referring Provider: Treating Provider/Extender: Kerry Hough in Treatment: 6 Diagnosis Coding ICD-10 Codes Code Description T81.31XA Disruption of external operation (surgical) wound, not elsewhere classified, initial encounter L97.322 Non-pressure chronic ulcer of left ankle with fat layer exposed C44.91 Basal cell carcinoma of skin, unspecified S81.811A Laceration without foreign body, right lower leg, initial encounter I48.20 Chronic atrial fibrillation, unspecified Z79.01 Long term (current) use of anticoagulants Facility Procedures : CPT4 Code: [id number] Description: [phone] (Facility Use Only) Apligraf 44 SQ CM Modifier: Quantity: 44 : CPT4 Code: [id number] Description: 15271 - SKIN SUB GRAFT TRNK/ARM/LEG ICD-10 Diagnosis Description L97.322 Non-pressure chronic ulcer of left ankle with fat layer exposed Modifier: Quantity: 1 Physician Procedures : CPT4 Code Description Modifier [id number] 15271 - WC PHYS SKIN SUB GRAFT TRNK/ARM/LEG ICD-10 Diagnosis Description L97.322 Non-pressure chronic ulcer of left ankle with fat layer exposed Quantity: 1 Electronic Signature(s) Signed: 12/05/2022 9:57:10 AM By: Allen Derry PA-C Entered By: Allen Derry on 12/05/2022 09:57:10

## 2022-12-06 ENCOUNTER — Ambulatory Visit: Payer: Medicare Other

## 2022-12-06 DIAGNOSIS — M6281 Muscle weakness (generalized): Secondary | ICD-10-CM | POA: Diagnosis not present

## 2022-12-06 DIAGNOSIS — R293 Abnormal posture: Secondary | ICD-10-CM | POA: Diagnosis not present

## 2022-12-06 DIAGNOSIS — M25511 Pain in right shoulder: Secondary | ICD-10-CM | POA: Diagnosis not present

## 2022-12-06 DIAGNOSIS — M545 Low back pain, unspecified: Secondary | ICD-10-CM | POA: Diagnosis not present

## 2022-12-06 DIAGNOSIS — Z0189 Encounter for other specified special examinations: Secondary | ICD-10-CM | POA: Diagnosis not present

## 2022-12-06 DIAGNOSIS — R011 Cardiac murmur, unspecified: Secondary | ICD-10-CM | POA: Diagnosis not present

## 2022-12-06 NOTE — Progress Notes (Signed)
HOLLACE, MICHELLI [place] ([id number]) [id number] Page 1 of 7 Visit Report for 12/05/2022 Arrival Information Details Patient Name: Date of Service: NO RMA Stan Head [name] 12/05/2022 8:30 A M Medical Record Number: [id number] Patient Account Number: [id number] Date of Birth/Sex: Treating RN: December 14, 1950 (72 y.o. [name] Primary Care Ocie Tino: Rodrigo Ran Other Clinician: Referring Doninique Lwin: Treating Idan Prime/Extender: Kerry Hough in Treatment: 6 Visit Information History Since Last Visit Added or deleted any medications: No Patient Arrived: Ambulatory Has Dressing in Place as Prescribed: Yes Arrival Time: 08:44 Pain Present Now: No Accompanied By: self Transfer Assistance: None Patient Requires Transmission-Based Precautions: No Patient Has Alerts: Yes Patient Alerts: Patient on Blood Thinner Eliquis//ASA Not Diabetic Electronic Signature(s) Signed: 12/06/2022 8:21:01 AM By: Midge Aver MSN RN CNS WTA Entered By: Midge Aver on 12/05/2022 08:44:45 -------------------------------------------------------------------------------- Clinic Level of Care Assessment Details Patient Name: Date of Service: NO RMA Stan Head [name] 12/05/2022 8:30 A M Medical Record Number: [id number] Patient Account Number: [id number] Date of Birth/Sex: Treating RN: [birth year] (72 y.o. [name] Primary Care Germain Koopmann: Rodrigo Ran Other Clinician: Referring Masayuki Sakai: Treating Minervia Osso/Extender: Kerry Hough in Treatment: 6 Clinic Level of Care Assessment Items TOOL 1 Quantity Score []  - 0 Use when EandM and Procedure is performed on INITIAL visit ASSESSMENTS - Nursing Assessment / Reassessment []  - 0 General Physical Exam (combine w/ comprehensive assessment (listed just below) when performed on new pt. evals) []  - 0 Comprehensive Assessment (HX, ROS, Risk Assessments, Wounds Hx, etc.) ASSESSMENTS - Wound and Skin Assessment /  Reassessment []  - 0 Dermatologic / Skin Assessment (not related to wound area) ASSESSMENTS - Ostomy and/or Continence Assessment and Care []  - 0 Incontinence Assessment and Management []  - 0 Ostomy Care Assessment and Management (repouching, etc.) PROCESS - Coordination of Care []  - 0 Simple Patient / Family Education for ongoing care []  - 0 Complex (extensive) Patient / Family Education for ongoing care []  - 0 Staff obtains Chiropractor, Records, T Results / Process Orders est []  - 0 Staff telephones HHA, Nursing Homes / Clarify orders / etc []  - 0 Routine Transfer to another Facility (non-emergent condition) []  - 0 Routine Hospital Admission (non-emergent condition) []  - 0 New Admissions / Insurance Authorizations / Ordering NPWT Apligraf, etc. , []  - 0 Emergency Hospital Admission (emergent condition) PROCESS - Special Needs WRENLEY, SAYED JANETTE ([id number]) [id number] Page 2 of 7 []  - 0 Pediatric / Minor Patient Management []  - 0 Isolation Patient Management []  - 0 Hearing / Language / Visual special needs []  - 0 Assessment of Community assistance (transportation, D/C planning, etc.) []  - 0 Additional assistance / Altered mentation []  - 0 Support Surface(s) Assessment (bed, cushion, seat, etc.) INTERVENTIONS - Miscellaneous []  - 0 External ear exam []  - 0 Patient Transfer (multiple staff / Nurse, adult / Similar devices) []  - 0 Simple Staple / Suture removal (25 or less) []  - 0 Complex Staple / Suture removal (26 or more) []  - 0 Hypo/Hyperglycemic Management (do not check if billed separately) []  - 0 Ankle / Brachial Index (ABI) - do not check if billed separately Has the patient been seen at the hospital within the last three years: Yes Total Score: 0 Level Of Care: ____ Electronic Signature(s) Signed: 12/06/2022 8:21:01 AM By: Midge Aver MSN RN CNS WTA Entered By: Midge Aver on 12/05/2022  09:18:46 -------------------------------------------------------------------------------- Encounter Discharge Information Details Patient Name: Date of Service: NO RMA N, EDNA JA [name] 12/05/2022 8:30 A Judie Petit  Medical Record Number: [id number] Patient Account Number: [id number] Date of Birth/Sex: Treating RN: 1950/09/06 (72 y.o. [name] Primary Care Yamato Kopf: Rodrigo Ran Other Clinician: Referring Gentle Hoge: Treating Denzell Colasanti/Extender: Kerry Hough in Treatment: 6 Encounter Discharge Information Items Post Procedure Vitals Discharge Condition: Stable Temperature (F): 98.1 Ambulatory Status: Ambulatory Pulse (bpm): 66 Discharge Destination: Home Respiratory Rate (breaths/min): 16 Transportation: Private Auto Blood Pressure (mmHg): 124/69 Accompanied By: self Schedule Follow-up Appointment: Yes Clinical Summary of Care: Electronic Signature(s) Signed: 12/05/2022 9:56:15 AM By: Midge Aver MSN RN CNS WTA Entered By: Midge Aver on 12/05/2022 09:56:15 -------------------------------------------------------------------------------- Lower Extremity Assessment Details Patient Name: Date of Service: NO RMA Stan Head [name] 12/05/2022 8:30 A M Medical Record Number: [id number] Patient Account Number: [id number] Date of Birth/Sex: Treating RN: October 24, 1950 (72 y.o. [name] Primary Care Lucrezia Dehne: Rodrigo Ran Other Clinician: Referring Johnnie Goynes: Treating Mauri Temkin/Extender: Kerry Hough in Treatment: 6 Edema Assessment Assessed: [Left: No] [Right: No] Edema: [Left: N] [Right: o] Calf MARIANY, MACKINTOSH JANETTE ([id number]) [id number] Page 3 of 7 Left: Right: Point of Measurement: 33 cm From Medial Instep 34.8 cm Ankle Left: Right: Point of Measurement: 12 cm From Medial Instep 22 cm Knee To Floor Left: Right: From Medial Instep 41 cm Vascular Assessment Pulses: Dorsalis Pedis Palpable: [Left:Yes] Electronic  Signature(s) Signed: 12/06/2022 8:21:01 AM By: Midge Aver MSN RN CNS WTA Entered By: Midge Aver on 12/05/2022 08:52:14 -------------------------------------------------------------------------------- Multi Wound Chart Details Patient Name: Date of Service: NO RMA Winona Legato JA [name] 12/05/2022 8:30 A M Medical Record Number: [id number] Patient Account Number: [id number] Date of Birth/Sex: Treating RN: Jun 14, 1951 (72 y.o. [name] Primary Care Shashwat Cleary: Rodrigo Ran Other Clinician: Referring Leavy Heatherly: Treating Dante Roudebush/Extender: Kerry Hough in Treatment: 6 Vital Signs Height(in): 61 Pulse(bpm): 66 Weight(lbs): 160 Blood Pressure(mmHg): 124/69 Body Mass Index(BMI): 30.2 Temperature(F): 98.1 Respiratory Rate(breaths/min): 16 [1:Photos:] [N/A:N/A] Left, Medial Ankle N/A N/A Wound Location: Surgical Injury N/A N/A Wounding Event: Atypical N/A N/A Primary Etiology: Hypertension, Neuropathy N/A N/A Comorbid History: 09/23/2022 N/A N/A Date Acquired: 6 N/A N/A Weeks of Treatment: Open N/A N/A Wound Status: No N/A N/A Wound Recurrence: 1.3x1.1x0.1 N/A N/A Measurements L x W x D (cm) 1.123 N/A N/A A (cm) : rea 0.112 N/A N/A Volume (cm) : 55.30% N/A N/A % Reduction in A rea: 77.70% N/A N/A % Reduction in Volume: Full Thickness Without Exposed N/A N/A Classification: Support Structures Medium N/A N/A Exudate Amount: Serosanguineous N/A N/A Exudate Type: red, brown N/A N/A Exudate Color: None Present (0%) N/A N/A Granulation Amount: Large (67-100%) N/A N/A Necrotic Amount: Fat Layer (Subcutaneous Tissue): Yes N/A N/A Exposed Structures: Fascia: No Tendon: No Muscle: No ROYANNE, WARSHAW ([id number]) [id number] Page 4 of 7 Joint: No Bone: No None N/A N/A Epithelialization: Treatment Notes Electronic Signature(s) Signed: 12/06/2022 8:21:01 AM By: Midge Aver MSN RN CNS WTA Entered By: Midge Aver on  12/05/2022 08:54:04 -------------------------------------------------------------------------------- Multi-Disciplinary Care Plan Details Patient Name: Date of Service: NO RMA Winona Legato JA [name] 12/05/2022 8:30 A M Medical Record Number: [id number] Patient Account Number: [id number] Date of Birth/Sex: Treating RN: April 11, 1951 (72 y.o. [name] Primary Care Avaline Stillson: Rodrigo Ran Other Clinician: Referring Tekeshia Klahr: Treating Hibah Odonnell/Extender: Kerry Hough in Treatment: 6 Active Inactive Necrotic Tissue Nursing Diagnoses: Impaired tissue integrity related to necrotic/devitalized tissue Knowledge deficit related to management of necrotic/devitalized tissue Goals: Necrotic/devitalized tissue will be minimized in the wound bed Date Initiated: 10/20/2022 Target Resolution Date: 12/22/2022 Goal Status: Active  Patient/caregiver will verbalize understanding of reason and process for debridement of necrotic tissue Date Initiated: 10/20/2022 Date Inactivated: 11/28/2022 Target Resolution Date: 11/20/2022 Goal Status: Met Interventions: Assess patient pain level pre-, during and post procedure and prior to discharge Provide education on necrotic tissue and debridement process Treatment Activities: Apply topical anesthetic as ordered : 10/18/2022 Notes: Electronic Signature(s) Signed: 12/06/2022 8:21:01 AM By: Midge Aver MSN RN CNS WTA Entered By: Midge Aver on 12/05/2022 09:19:24 -------------------------------------------------------------------------------- Pain Assessment Details Patient Name: Date of Service: NO RMA Winona Legato JA [name] 12/05/2022 8:30 A M Medical Record Number: [id number] Patient Account Number: [id number] Date of Birth/Sex: Treating RN: Apr 08, 1951 (72 y.o. [name] Primary Care Vlasta Baskin: Rodrigo Ran Other Clinician: Referring Esdras Delair: Treating Cheri Ayotte/Extender: Kerry Hough in Treatment: 6 Active Problems Location of Pain  Severity and Description of Pain Patient Has Paino No Site Locations [place], [place] ([id number]) [phone].pdf Page 5 of 7 Pain Management and Medication Current Pain Management: Electronic Signature(s) Signed: 12/06/2022 8:21:01 AM By: Midge Aver MSN RN CNS WTA Entered By: Midge Aver on 12/05/2022 08:46:28 -------------------------------------------------------------------------------- Patient/Caregiver Education Details Patient Name: Date of Service: NO RMA Stan Head [name] 4/16/2024andnbsp8:30 A M Medical Record Number: [id number] Patient Account Number: [id number] Date of Birth/Gender: Treating RN: [birth year] (72 y.o. [name] Primary Care Physician: Rodrigo Ran Other Clinician: Referring Physician: Treating Physician/Extender: Kerry Hough in Treatment: 6 Education Assessment Education Provided To: Patient Education Topics Provided Wound/Skin Impairment: Handouts: Caring for Your Ulcer Methods: Explain/Verbal Responses: State content correctly Electronic Signature(s) Signed: 12/06/2022 8:21:01 AM By: Midge Aver MSN RN CNS WTA Entered By: Midge Aver on 12/05/2022 09:19:35 -------------------------------------------------------------------------------- Wound Assessment Details Patient Name: Date of Service: NO RMA Winona Legato JA [name] 12/05/2022 8:30 A M Medical Record Number: [id number] Patient Account Number: [id number] Date of Birth/Sex: Treating RN: September 08, 1950 (72 y.o. [name] Primary Care Jaxxson Cavanah: Rodrigo Ran Other Clinician: Referring Audrea Bolte: Treating Yesli Vanderhoff/Extender: Kerry Hough in Treatment: 6 Wound Status Wound Number: 1 Primary Etiology: Atypical Wound Location: Left, Medial Ankle Wound Status: Open Wounding Event: Surgical Injury Comorbid History: Hypertension, Neuropathy Date Acquired: 09/23/2022 [facility] Of Treatment: [address] [place] ([id number])  [id number] Page 6 of 7 Clustered Wound: No Photos Wound Measurements Length: (cm) 1.3 Width: (cm) 1.1 Depth: (cm) 0.1 Area: (cm) 1.123 Volume: (cm) 0.112 % Reduction in Area: 55.3% % Reduction in Volume: 77.7% Epithelialization: None Wound Description Classification: Full Thickness Without Exposed Support Exudate Amount: Medium Exudate Type: Serosanguineous Exudate Color: red, brown Structures Foul Odor After Cleansing: No Slough/Fibrino Yes Wound Bed Granulation Amount: None Present (0%) Exposed Structure Necrotic Amount: Large (67-100%) Fascia Exposed: No Necrotic Quality: Adherent Slough Fat Layer (Subcutaneous Tissue) Exposed: Yes Tendon Exposed: No Muscle Exposed: No Joint Exposed: No Bone Exposed: No Treatment Notes Wound #1 (Ankle) Wound Laterality: Left, Medial Cleanser Vashe 5.8 (oz) Discharge Instruction: Use vashe 5.8 (oz) as directed Peri-Wound Care Topical Primary Dressing Apligraf Secondary Dressing (BORDER) Zetuvit Plus SILICONE BORDER Dressing 5x5 (in/in) Discharge Instruction: Please do not put silicone bordered dressings under wraps. Use non-bordered dressing only. Sorbact Discharge Instruction: In place of contact layer Secured With Steri-Strip 0.25x4 (in/in) Discharge Instruction: Apply Steri-Strip as directed Compression Wrap Compression Stockings Add-Ons Electronic Signature(s) Signed: 12/06/2022 8:21:01 AM By: Midge Aver MSN RN CNS WTA Entered By: Midge Aver on 12/05/2022 08:50:51 George Hugh ([id number]) [id number] Page 7 of 7 -------------------------------------------------------------------------------- Vitals Details Patient Name: Date of Service: NO RMA N, EDNA JA [name]  12/05/2022 8:30 A M Medical Record Number: [id number] Patient Account Number: [id number] Date of Birth/Sex: Treating RN: 30-Nov-1950 (72 y.o. [name] Primary Care Anira Senegal: Rodrigo Ran Other  Clinician: Referring Doristine Shehan: Treating Cherre Kothari/Extender: Kerry Hough in Treatment: 6 Vital Signs Time Taken: 08:44 Temperature (F): 98.1 Height (in): 61 Pulse (bpm): 66 Weight (lbs): 160 Respiratory Rate (breaths/min): 16 Body Mass Index (BMI): 30.2 Blood Pressure (mmHg): 124/69 Reference Range: 80 - 120 mg / dl Electronic Signature(s) Signed: 12/06/2022 8:21:01 AM By: Midge Aver MSN RN CNS WTA Entered By: Midge Aver on 12/05/2022 08:46:21

## 2022-12-11 DIAGNOSIS — M25511 Pain in right shoulder: Secondary | ICD-10-CM | POA: Diagnosis not present

## 2022-12-11 DIAGNOSIS — R293 Abnormal posture: Secondary | ICD-10-CM | POA: Diagnosis not present

## 2022-12-11 DIAGNOSIS — M6281 Muscle weakness (generalized): Secondary | ICD-10-CM | POA: Diagnosis not present

## 2022-12-11 DIAGNOSIS — M545 Low back pain, unspecified: Secondary | ICD-10-CM | POA: Diagnosis not present

## 2022-12-14 ENCOUNTER — Encounter: Payer: Medicare Other | Admitting: Physician Assistant

## 2022-12-14 DIAGNOSIS — Z7901 Long term (current) use of anticoagulants: Secondary | ICD-10-CM | POA: Diagnosis not present

## 2022-12-14 DIAGNOSIS — L97322 Non-pressure chronic ulcer of left ankle with fat layer exposed: Secondary | ICD-10-CM | POA: Diagnosis not present

## 2022-12-14 DIAGNOSIS — M313 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: Secondary | ICD-10-CM | POA: Diagnosis not present

## 2022-12-14 DIAGNOSIS — S91002A Unspecified open wound, left ankle, initial encounter: Secondary | ICD-10-CM | POA: Diagnosis not present

## 2022-12-14 DIAGNOSIS — I482 Chronic atrial fibrillation, unspecified: Secondary | ICD-10-CM | POA: Diagnosis not present

## 2022-12-14 DIAGNOSIS — G8929 Other chronic pain: Secondary | ICD-10-CM | POA: Diagnosis not present

## 2022-12-14 DIAGNOSIS — I10 Essential (primary) hypertension: Secondary | ICD-10-CM | POA: Diagnosis not present

## 2022-12-14 NOTE — Progress Notes (Addendum)
WESTLYNN, FIFER Palmer ([id number]) [id number] Page 1 of 7 Visit Report for 12/14/2022 Chief Complaint Document Details Patient Name: Date of Service: [name] 12/14/2022 9:00 A M Medical Record Number: [id number] Patient Account Number: [id number] Date of Birth/Sex: Treating RN: 11-04-1950 (72 y.o. Skip Mayer Primary Care Provider: Rodrigo Ran Other Clinician: Betha Loa Referring Provider: Treating Provider/Extender: Kerry Hough in Treatment: 8 Information Obtained from: Patient Chief Complaint 10/18/2022; left lower extremity wound status post [facility] removal with mohs surgery, right lower extremity skin tear from fall Electronic Signature(s) Signed: 12/14/2022 9:25:18 AM By: Allen Derry PA-C Entered By: Allen Derry on 12/14/2022 09:25:18 -------------------------------------------------------------------------------- Cellular or Tissue Based Product Details Patient Name: Date of Service: [name] 12/14/2022 9:00 A M Medical Record Number: [id number] Patient Account Number: [id number] Date of Birth/Sex: Treating RN: October 21, 1950 (72 y.o. Skip Mayer Primary Care Provider: Rodrigo Ran Other Clinician: Betha Loa Referring Provider: Treating Provider/Extender: Kerry Hough in Treatment: 8 Cellular or Tissue Based Product Type Wound #1 Left,Medial Ankle Applied to: Performed By: Physician Allen Derry, PA-C Cellular or Tissue Based Product Type: Apligraf Level of Consciousness (Pre-procedure): Awake and Alert Pre-procedure Verification/Time Out Yes - 09:58 Taken: Location: trunk / arms / legs Wound Size (sq cm): 2.1 Product Size (sq cm): 44 Waste Size (sq cm): 11 Waste Reason: wound size Amount of Product Applied (sq cm): 33 Instrument Used: Blade, Forceps, Scissors Lot #: [id number] Order #: 3 Expiration Date: 12/22/2022 Fenestrated: Yes Instrument: Blade Reconstituted:  Yes Solution Type: normal saline Solution Amount: 12 mls Lot #: [id number] Solution Expiration Date: 04/10/2024 Secured: Yes Secured With: Steri-Strips Dressing Applied: Yes Primary Dressing: surical lubricant, zetuvit Response to Treatment: Procedure was tolerated well Level of Consciousness (Post- Awake and Alert procedure): Post Procedure Diagnosis Same as Pre-procedure Electronic Signature(s) Signed: 12/15/2022 1:22:31 PM By: Betha Loa Entered By: Betha Loa on 12/14/2022 10:06:15 [name] ([id number]) [id number] Page 2 of 7 -------------------------------------------------------------------------------- HPI Details Patient Name: Date of Service: [name] 12/14/2022 9:00 A M Medical Record Number: [id number] Patient Account Number: [id number] Date of Birth/Sex: Treating RN: 10-30-1950 (72 y.o. Skip Mayer Primary Care Provider: Rodrigo Ran Other Clinician: Betha Loa Referring Provider: Treating Provider/Extender: Kerry Hough in Treatment: 8 History of Present Illness HPI Description: 10/18/2022 [name] is a 73 year old female with a past medical history of A-fib on Eliquis, Wegener's granulomatosis with vasculitis, and basal cell carcinoma of the left leg that presents the clinic for a wound to the left leg status post Mohs procedure for basal cell carcinoma removal and skin tear to the right lower extremity that occurred 3 days ago after a fall. She has been using Vaseline to the wound beds. She has compression stockings and has been using these. She currently has chronic pain to the left leg wound site with slight erythema and warmth to the periwound. 3/6; patient presents for follow-up. She has been using Medihoney and Hydrofera Blue to the left lower extremity wound and Xeroform to the right lower extremity wound. She denies signs of infection. She completed her oral  antibiotics. 11-07-2022 upon evaluation today patient appears to be doing well currently in regard to her wound. This actually did require some sharp debridement to clearway the necrotic debris. She actually tolerated that debridement today well. 11-14-2022 upon evaluation today patient appears to be doing well currently in regard to her wound. There is  some slough and biofilm noted on the surface of the wound currently but fortunately nothing that appears to be too significant. I do think she is going to require some sharp debridement today. Fortunately I do not see any evidence of infection locally or systemically at this point. 11-21-2022 upon evaluation today patient appears to be doing well currently in regard to her wound although it is slow to heal she is making some slight progress. I think we should consider going forward with a skin sub-I discussed that with her today I think Apligraf would be a good idea. 11-28-2022 upon evaluation today patient is here today for her first application of Apligraf which she has actually been doing very well with. Fortunately there does not appear to be any signs of active infection at this time. 12-05-2022 upon evaluation today patient actually appears to be doing excellent in regard to her wound this is showing signs of good improvement I am very pleased in this regard. Fortunately I do not see any signs of active infection locally nor systemically which is great news. 12-14-2022 upon evaluation today patient appears to be doing excellent the treatment with the Apligraf does seem to have been extremely beneficial for her. I am actually very pleased with where things stand and I do believe that she is moving in the right direction here. Electronic Signature(s) Signed: 12/14/2022 5:29:33 PM By: Allen Derry PA-C Entered By: Allen Derry on 12/14/2022 17:29:33 -------------------------------------------------------------------------------- Physical Exam  Details Patient Name: Date of Service: [name] 12/14/2022 9:00 A M Medical Record Number: [id number] Patient Account Number: [id number] Date of Birth/Sex: Treating RN: 09-16-1950 (73 y.o. Skip Mayer Primary Care Provider: Rodrigo Ran Other Clinician: Betha Loa Referring Provider: Treating Provider/Extender: Kerry Hough in Treatment: 8 Constitutional Well-nourished and well-hydrated in Joyce acute distress. Respiratory normal breathing without difficulty. Psychiatric this patient is able to make decisions and demonstrates good insight into disease process. Alert and Oriented x 3. pleasant and cooperative. Notes Upon inspection patient's wound bed did not require any sharp debridement today it actually appears to be doing very well and pleased in that regard I would recommend that we continue with the Apligraf as such as I do feel like this is really doing an awesome job. Electronic Signature(s) Signed: 12/14/2022 5:29:50 PM By: Allen Derry PA-C Entered By: Allen Derry on 12/14/2022 17:29:50 [name] ([id number]) [id number] Page 3 of 7 -------------------------------------------------------------------------------- Physician Orders Details Patient Name: Date of Service: [name] 12/14/2022 9:00 A M Medical Record Number: [id number] Patient Account Number: [id number] Date of Birth/Sex: Treating RN: 04-20-1951 (72 y.o. Skip Mayer Primary Care Provider: Rodrigo Ran Other Clinician: Betha Loa Referring Provider: Treating Provider/Extender: Kerry Hough in Treatment: 8 Verbal / Phone Orders: Yes Clinician: Huel Coventry Read Back and Verified: Yes Diagnosis Coding ICD-10 Coding Code Description T81.31XA Disruption of external operation (surgical) wound, not elsewhere classified, initial encounter L97.322 Non-pressure chronic ulcer of left ankle with fat layer  exposed C44.91 Basal cell carcinoma of skin, unspecified S81.811A Laceration without foreign body, right lower leg, initial encounter I48.20 Chronic atrial fibrillation, unspecified Z79.01 Long term (current) use of anticoagulants Follow-up Appointments Return Appointment in 1 week. Nurse Visit as needed Bathing/ Shower/ Hygiene Wound #1 Left,Medial Ankle Wash wounds with antibacterial soap and water. May shower with wound dressing protected with water repellent cover or cast protector. Joyce tub bath. Anesthetic (Use 'Patient Medications' Section for Anesthetic Order Entry)  Lidocaine applied to wound bed Cellular or Tissue Based Products Wound #1 Left,Medial Ankle Cellular or Tissue Based Product Type: - Apligraf Cellular or Tissue Based Product applied to wound bed; including contact layer, fixation with steri-strips, dry gauze and cover dressing. (DO NOT REMOVE). - May change outer dressing only Edema Control - Lymphedema / Segmental Compressive Device / Other Wound #1 Left,Medial Ankle Patient to wear own compression stockings. Remove compression stockings every night before going to bed and put on every morning when getting up. - Bilat lower legs Elevate, Exercise Daily and A void Standing for Long Periods of Time. Wound Treatment Wound #1 - Ankle Wound Laterality: Left, Medial Cleanser: Vashe 5.8 (oz) 1 x Per Week/30 Days Discharge Instructions: Use vashe 5.8 (oz) as directed Topical: surgical lubricant 1 x Per Week/30 Days Discharge Instructions: apply light amount over sorbact then apply zetuvit Prim Dressing: Apligraf ary 1 x Per Week/30 Days Secondary Dressing: (BORDER) Zetuvit Plus SILICONE BORDER Dressing 5x5 (in/in) 1 x Per Week/30 Days Discharge Instructions: Please do not put silicone bordered dressings under wraps. Use non-bordered dressing only. Secondary Dressing: Sorbact 1 x Per Week/30 Days Discharge Instructions: In place of contact layer Secured With:  Steri-Strip 0.25x4 (in/in) 1 x Per Week/30 Days Discharge Instructions: Apply Steri-Strip as directed Electronic Signature(s) Signed: 12/14/2022 5:47:58 PM By: Allen Derry PA-C Signed: 12/15/2022 1:22:31 PM By: Cannon Kettle, EDNA Kaufman ([id number]) [id number] Page 4 of 7 Entered By: Betha Loa on 12/14/2022 11:11:47 -------------------------------------------------------------------------------- Problem List Details Patient Name: Date of Service: [name] 12/14/2022 9:00 A M Medical Record Number: [id number] Patient Account Number: [id number] Date of Birth/Sex: Treating RN: Dec 13, 1950 (73 y.o. Cathlean Cower, [name] Primary Care Provider: Rodrigo Ran Other Clinician: Betha Loa Referring Provider: Treating Provider/Extender: Kerry Hough in Treatment: 8 Active Problems ICD-10 Encounter Code Description Active Date MDM Diagnosis T81.31XA Disruption of external operation (surgical) wound, not elsewhere classified, 10/18/2022 Joyce Yes initial encounter L97.322 Non-pressure chronic ulcer of left ankle with fat layer exposed 10/18/2022 Joyce Yes C44.91 Basal cell carcinoma of skin, unspecified 10/18/2022 Joyce Yes S81.811A Laceration without foreign body, right lower leg, initial encounter 10/18/2022 Joyce Yes I48.20 Chronic atrial fibrillation, unspecified 10/18/2022 Joyce Yes Z79.01 Long term (current) use of anticoagulants 10/18/2022 Joyce Yes Inactive Problems Resolved Problems Electronic Signature(s) Signed: 12/14/2022 9:25:03 AM By: Allen Derry PA-C Entered By: Allen Derry on 12/14/2022 09:25:03 -------------------------------------------------------------------------------- Progress Note Details Patient Name: Date of Service: [name] 12/14/2022 9:00 A M Medical Record Number: [id number] Patient Account Number: [id number] Date of Birth/Sex: Treating RN: 04/14/1951 (72 y.o. Skip Mayer Primary Care Provider: Rodrigo Ran Other Clinician: Betha Loa Referring Provider: Treating Provider/Extender: Kerry Hough in Treatment: 8 Subjective Chief Complaint Information obtained from Patient 10/18/2022; left lower extremity wound status post [facility] removal with mohs surgery, right lower extremity skin tear from fall History of Present Illness (HPI) 10/18/2022 Ms. Zuleyka Kloc is a 72 year old female with a past medical history of A-fib on Eliquis, Wegener's granulomatosis with vasculitis, and basal cell [name], [name] ([id number]) [id number] Page 5 of 7 carcinoma of the left leg that presents the clinic for a wound to the left leg status post Mohs procedure for basal cell carcinoma removal and skin tear to the right lower extremity that occurred 3 days ago after a fall. She has been using Vaseline to the wound beds. She has compression stockings and has been using these. She currently has  chronic pain to the left leg wound site with slight erythema and warmth to the periwound. 3/6; patient presents for follow-up. She has been using Medihoney and Hydrofera Blue to the left lower extremity wound and Xeroform to the right lower extremity wound. She denies signs of infection. She completed her oral antibiotics. 11-07-2022 upon evaluation today patient appears to be doing well currently in regard to her wound. This actually did require some sharp debridement to clearway the necrotic debris. She actually tolerated that debridement today well. 11-14-2022 upon evaluation today patient appears to be doing well currently in regard to her wound. There is some slough and biofilm noted on the surface of the wound currently but fortunately nothing that appears to be too significant. I do think she is going to require some sharp debridement today. Fortunately I do not see any evidence of infection locally or systemically at this point. 11-21-2022 upon evaluation today  patient appears to be doing well currently in regard to her wound although it is slow to heal she is making some slight progress. I think we should consider going forward with a skin sub-I discussed that with her today I think Apligraf would be a good idea. 11-28-2022 upon evaluation today patient is here today for her first application of Apligraf which she has actually been doing very well with. Fortunately there does not appear to be any signs of active infection at this time. 12-05-2022 upon evaluation today patient actually appears to be doing excellent in regard to her wound this is showing signs of good improvement I am very pleased in this regard. Fortunately I do not see any signs of active infection locally nor systemically which is great news. 12-14-2022 upon evaluation today patient appears to be doing excellent the treatment with the Apligraf does seem to have been extremely beneficial for her. I am actually very pleased with where things stand and I do believe that she is moving in the right direction here. Objective Constitutional Well-nourished and well-hydrated in Joyce acute distress. Vitals Time Taken: 9:18 AM, Height: 61 in, Weight: 160 lbs, BMI: 30.2, Temperature: 97.6 F, Pulse: 59 bpm, Respiratory Rate: 18 breaths/min, Blood Pressure: 141/67 mmHg. Respiratory normal breathing without difficulty. Psychiatric this patient is able to make decisions and demonstrates good insight into disease process. Alert and Oriented x 3. pleasant and cooperative. General Notes: Upon inspection patient's wound bed did not require any sharp debridement today it actually appears to be doing very well and pleased in that regard I would recommend that we continue with the Apligraf as such as I do feel like this is really doing an awesome job. Integumentary (Hair, Skin) Wound #1 status is Open. Original cause of wound was Surgical Injury. The date acquired was: 09/23/2022. The wound has been in treatment  8 weeks. The wound is located on the Left,Medial Ankle. The wound measures 1.4cm length x 1.5cm width x 0.2cm depth; 1.649cm^2 area and 0.33cm^3 volume. There is Fat Layer (Subcutaneous Tissue) exposed. There is a medium amount of serosanguineous drainage noted. There is Joyce granulation within the wound bed. There is a large (67-100%) amount of necrotic tissue within the wound bed including Adherent Slough. Assessment Active Problems ICD-10 Disruption of external operation (surgical) wound, not elsewhere classified, initial encounter Non-pressure chronic ulcer of left ankle with fat layer exposed Basal cell carcinoma of skin, unspecified Laceration without foreign body, right lower leg, initial encounter Chronic atrial fibrillation, unspecified Long term (current) use of anticoagulants Procedures Wound #1 Pre-procedure diagnosis  of Wound #1 is an Atypical located on the Left,Medial Ankle. A skin graft procedure using a bioengineered skin substitute/cellular or tissue based product was performed by Allen Derry, PA-C with the following instrument(s): Blade, Forceps, and Scissors. Apligraf was applied and secured with Steri-Strips. 33 sq cm of product was utilized and 11 sq cm was wasted due to wound size. Post Application, surical lubricant, zetuvit was applied. A Time Out was conducted at 09:58, prior to the start of the procedure. The procedure was tolerated well. Post procedure Diagnosis Wound #1: Same as Pre-Procedure . [name], [name] [place] ([id number]) [id number] Page 6 of 7 Plan Follow-up Appointments: Return Appointment in 1 week. Nurse Visit as needed Bathing/ Shower/ Hygiene: Wound #1 Left,Medial Ankle: Wash wounds with antibacterial soap and water. May shower with wound dressing protected with water repellent cover or cast protector. Joyce tub bath. Anesthetic (Use 'Patient Medications' Section for Anesthetic Order Entry): Lidocaine applied to wound  bed Cellular or Tissue Based Products: Wound #1 Left,Medial Ankle: Cellular or Tissue Based Product Type: - Apligraf Cellular or Tissue Based Product applied to wound bed; including contact layer, fixation with steri-strips, dry gauze and cover dressing. (DO NOT REMOVE). - May change outer dressing only Edema Control - Lymphedema / Segmental Compressive Device / Other: Wound #1 Left,Medial Ankle: Patient to wear own compression stockings. Remove compression stockings every night before going to bed and put on every morning when getting up. - Bilat lower legs Elevate, Exercise Daily and Avoid Standing for Long Periods of Time. WOUND #1: - Ankle Wound Laterality: Left, Medial Cleanser: Vashe 5.8 (oz) 1 x Per Week/30 Days Discharge Instructions: Use vashe 5.8 (oz) as directed Topical: surgical lubricant 1 x Per Week/30 Days Discharge Instructions: apply light amount over sorbact then apply zetuvit Prim Dressing: Apligraf 1 x Per Week/30 Days ary Secondary Dressing: (BORDER) Zetuvit Plus SILICONE BORDER Dressing 5x5 (in/in) 1 x Per Week/30 Days Discharge Instructions: Please do not put silicone bordered dressings under wraps. Use non-bordered dressing only. Secondary Dressing: Sorbact 1 x Per Week/30 Days Discharge Instructions: In place of contact layer Secured With: Steri-Strip 0.25x4 (in/in) 1 x Per Week/30 Days Discharge Instructions: Apply Steri-Strip as directed 1. I would recommend that we have the patient continue to monitor for any signs of infection or worsening. I do think that the Apligraf and reapplication is appropriate this is application #3 today and I am actually can leave this on for the next 2 weeks. 2. I would recommend that we should change the dressing twice a week she put a little hydrogel over top of where the graft is in order to keep it from drying out. 3. I would also recommend that she continue to monitor for any signs of infection or worsening onset anything  changes she knows contact the office and let me know. We will see patient back for reevaluation in 2 weeks here in the clinic. If anything worsens or changes patient will contact our office for additional recommendations. Electronic Signature(s) Signed: 12/14/2022 5:30:29 PM By: Allen Derry PA-C Entered By: Allen Derry on 12/14/2022 17:30:29 -------------------------------------------------------------------------------- SuperBill Details Patient Name: Date of Service: [name] 12/14/2022 Medical Record Number: [id number] Patient Account Number: [id number] Date of Birth/Sex: Treating RN: 1951/06/25 (72 y.o. Skip Mayer Primary Care Provider: Rodrigo Ran Other Clinician: Betha Loa Referring Provider: Treating Provider/Extender: Kerry Hough in Treatment: 8 Diagnosis Coding ICD-10 Codes Code Description T81.31XA Disruption of external operation (surgical) wound, not elsewhere classified, initial  encounter [phone] Non-pressure chronic ulcer of left ankle with fat layer exposed C44.91 Basal cell carcinoma of skin, unspecified S81.811A Laceration without foreign body, right lower leg, initial encounter I48.20 Chronic atrial fibrillation, unspecified Z79.01 Long term (current) use of anticoagulants [name], [name] ([id number]) [id number] Page 7 of 7 Facility Procedures : CPT4 Code: [id number] Description: [phone] (Facility Use Only) Apligraf 44 SQ CM Modifier: Quantity: 44 : CPT4 Code: [id number] 1 Description: 5271 - SKIN SUB GRAFT TRNK/ARM/LEG ICD-10 Diagnosis Description L97.322 Non-pressure chronic ulcer of left ankle with fat layer exposed Modifier: Quantity: 1 Physician Procedures : CPT4 Code Description Modifier [id number] 15271 - WC PHYS SKIN SUB GRAFT TRNK/ARM/LEG ICD-10 Diagnosis Description L97.322 Non-pressure chronic ulcer of left ankle with fat layer exposed Quantity: 1 Electronic Signature(s) Signed:  12/14/2022 5:32:58 PM By: Allen Derry PA-C Entered By: Allen Derry on 12/14/2022 17:32:58

## 2022-12-16 NOTE — Progress Notes (Signed)
MAJESTIC, MOLONY [place] ([id number]) [id number] Page 1 of 7 Visit Report for 12/14/2022 Arrival Information Details Patient Name: Date of Service: NO RMA [name] 12/14/2022 9:00 A M Medical Record Number: [id number] Patient Account Number: [id number] Date of Birth/Sex: Treating RN: [name][birth year] (72 y.o. Skip Mayer Primary Care Sally-Anne Wamble: Rodrigo Ran Other Clinician: Betha Loa Referring Savanna Dooley: Treating Rayaan Lorah/Extender: Kerry Hough in Treatment: 8 Visit Information History Since Last Visit All ordered tests and consults were completed: No Patient Arrived: Ambulatory Added or deleted any medications: No Arrival Time: 09:14 Any new allergies or adverse reactions: No Transfer Assistance: None Had a fall or experienced change in No Patient Identification Verified: Yes activities of daily living that may affect Secondary Verification Process Completed: Yes risk of falls: Patient Requires Transmission-Based Precautions: No Signs or symptoms of abuse/neglect since last visito No Patient Has Alerts: Yes Hospitalized since last visit: No Patient Alerts: Patient on Blood Thinner Implantable device outside of the clinic excluding No Eliquis//ASA cellular tissue based products placed in the center Not Diabetic since last visit: Has Dressing in Place as Prescribed: Yes Has Compression in Place as Prescribed: Yes Pain Present Now: Yes Electronic Signature(s) Signed: 12/15/2022 1:22:31 PM By: Betha Loa Entered By: Betha Loa on 12/14/2022 09:17:08 -------------------------------------------------------------------------------- Clinic Level of Care Assessment Details Patient Name: Date of Service: NO RMA [name] 12/14/2022 9:00 A M Medical Record Number: [id number] Patient Account Number: [id number] Date of Birth/Sex: Treating RN: [birth year][name] (72 y.o. Skip Mayer Primary Care Toneisha Savary: Rodrigo Ran Other  Clinician: Betha Loa Referring Thunder [place]: Treating Onesimo Lingard/Extender: Kerry Hough in Treatment: 8 Clinic Level of Care Assessment Items TOOL 1 Quantity Score []  - 0 Use when EandM and Procedure is performed on INITIAL visit ASSESSMENTS - Nursing Assessment / Reassessment []  - 0 General Physical Exam (combine w/ comprehensive assessment (listed just below) when performed on new pt. evals) []  - 0 Comprehensive Assessment (HX, ROS, Risk Assessments, Wounds Hx, etc.) ASSESSMENTS - Wound and Skin Assessment / Reassessment []  - 0 Dermatologic / Skin Assessment (not related to wound area) ASSESSMENTS - Ostomy and/or Continence Assessment and Care []  - 0 Incontinence Assessment and Management []  - 0 Ostomy Care Assessment and Management (repouching, etc.) PROCESS - Coordination of Care []  - 0 Simple Patient / Family Education for ongoing care []  - 0 Complex (extensive) Patient / Family Education for ongoing care []  - 0 Staff obtains Chiropractor, Records, T Results / Process Orders est []  - 0 Staff telephones HHA, Nursing Homes / Clarify orders / etc []  - 0 Routine Transfer to another Facility (non-emergent condition) []  - 0 Routine Hospital Admission (non-emergent condition) [name], [name] [id number] Page 2 of 7 []  - 0 New Admissions / Manufacturing engineer / Ordering NPWT Apligraf, etc. , []  - 0 Emergency Hospital Admission (emergent condition) PROCESS - Special Needs []  - 0 Pediatric / Minor Patient Management []  - 0 Isolation Patient Management []  - 0 Hearing / Language / Visual special needs []  - 0 Assessment of Community assistance (transportation, D/C planning, etc.) []  - 0 Additional assistance / Altered mentation []  - 0 Support Surface(s) Assessment (bed, cushion, seat, etc.) INTERVENTIONS - Miscellaneous []  - 0 External ear exam []  - 0 Patient Transfer (multiple staff / Nurse, adult / Similar  devices) []  - 0 Simple Staple / Suture removal (25 or less) []  - 0 Complex Staple / Suture removal (26 or more) []  - 0 Hypo/Hyperglycemic Management (do not check  if billed separately) []  - 0 Ankle / Brachial Index (ABI) - do not check if billed separately Has the patient been seen at the hospital within the last three years: Yes Total Score: 0 Level Of Care: ____ Electronic Signature(s) Signed: 12/15/2022 1:22:31 PM By: Betha Loa Entered By: Betha Loa on 12/14/2022 10:04:03 -------------------------------------------------------------------------------- Encounter Discharge Information Details Patient Name: Date of Service: NO RMA [name] 12/14/2022 9:00 A M Medical Record Number: [id number] Patient Account Number: [id number] Date of Birth/Sex: Treating RN: May [birth year], [name] (72 y.o. Skip Mayer Primary Care Elois Averitt: Rodrigo Ran Other Clinician: Betha Loa Referring Nikolas Casher: Treating Yomaira Solar/Extender: Kerry Hough in Treatment: 8 Encounter Discharge Information Items Post Procedure Vitals Discharge Condition: Stable Temperature (F): 97.6 Ambulatory Status: Ambulatory Pulse (bpm): 59 Discharge Destination: Home Respiratory Rate (breaths/min): 18 Transportation: Private Auto Blood Pressure (mmHg): 141/67 Accompanied By: self Schedule Follow-up Appointment: Yes Clinical Summary of Care: Electronic Signature(s) Signed: 12/15/2022 1:22:31 PM By: Betha Loa Entered By: Betha Loa on 12/14/2022 11:12:49 -------------------------------------------------------------------------------- Lower Extremity Assessment Details Patient Name: Date of Service: NO RMA [name] 12/14/2022 9:00 A M Medical Record Number: [id number] Patient Account Number: [id number] Date of Birth/Sex: Treating RN: [name] (72 y.o. Skip Mayer Primary Care Hamdi Vari: Rodrigo Ran Other Clinician: Betha Loa Referring Lashan Gluth: Treating  Deagen Krass/Extender: Kerry Hough in Treatment: 8 Edema Assessment [name][Left: Joyce, Kaufman ([id number])] Franne Forts: [id number] Page 3 of 7] Assessed: [Left: Yes] [Right: No] Edema: [Left: Ye] [Right: s] Calf Left: Right: Point of Measurement: 33 cm From Medial Instep 34.9 cm Ankle Left: Right: Point of Measurement: 12 cm From Medial Instep 22.9 cm Knee To Floor Left: Right: From Medial Instep 41 cm Vascular Assessment Pulses: Dorsalis Pedis Palpable: [Left:Yes] Electronic Signature(s) Signed: 12/14/2022 3:23:16 PM By: Elliot Gurney, BSN, RN, CWS, [name] RN, BSN Signed: 12/15/2022 1:22:31 PM By: Betha Loa Entered By: Betha Loa on 12/14/2022 09:31:08 -------------------------------------------------------------------------------- Multi Wound Chart Details Patient Name: Date of Service: NO RMA [name] 12/14/2022 9:00 A M Medical Record Number: [id number] Patient Account Number: [id number] Date of Birth/Sex: Treating RN: January [birth year], [name] (72 y.o. Skip Mayer Primary Care Maximo Spratling: Rodrigo Ran Other Clinician: Betha Loa Referring Gesenia Bantz: Treating Jarquez Mestre/Extender: Kerry Hough in Treatment: 8 Vital Signs Height(in): 61 Pulse(bpm): 59 Weight(lbs): 160 Blood Pressure(mmHg): 141/67 Body Mass Index(BMI): Kaufman.2 Temperature(F): 97.6 Respiratory Rate(breaths/min): 18 [1:Photos:] [[name]/A:[name]/A] Left, Medial Ankle [name]/A [name]/A Wound Location: Surgical Injury [name]/A [name]/A Wounding Event: Atypical [name]/A [name]/A Primary Etiology: Hypertension, Neuropathy [name]/A [name]/A Comorbid History: 09/23/2022 [name]/A [name]/A Date Acquired: 8 [name]/A [name]/A Weeks of Treatment: Open [name]/A [name]/A Wound Status: No [name]/A [name]/A Wound Recurrence: 1.4x1.5x0.2 [name]/A [name]/A Measurements L x W x D (cm) 1.649 [name]/A [name]/A A (cm) : rea 0.33 [name]/A [name]/A Volume (cm) : 34.40% [name]/A [name]/A % Reduction in A rea: 34.40% [name]/A [name]/A % Reduction in Volume: Full Thickness Without Exposed [name]/A  [name]/A Classification: Support Structures Medium [name]/A [name]/A Exudate Amount: Serosanguineous [name]/A [name]/A Exudate Type: red, brown [name]/A [name]/A Exudate ColorIKRAN, Kaufman ([id number]) [id number] Page 4 of 7 None Present (0%) [name]/A [name]/A Granulation Amount: Large (67-100%) [name]/A [name]/A Necrotic Amount: Fat Layer (Subcutaneous Tissue): Yes [name]/A [name]/A Exposed Structures: Fascia: No Tendon: No Muscle: No Joint: No Bone: No None [name]/A [name]/A Epithelialization: Treatment Notes Electronic Signature(s) Signed: 12/15/2022 1:22:31 PM By: Betha Loa Entered By: Betha Loa on 12/14/2022 09:31:18 -------------------------------------------------------------------------------- Multi-Disciplinary Care Plan Details Patient Name: Date of Service: NO RMA [name], [name]  12/14/2022 9:00 A M Medical Record Number: [id number] Patient Account Number: [id number] Date of Birth/Sex: Treating RN: [name] (72 y.o. Skip Mayer Primary Care Ovie Eastep: Rodrigo Ran Other Clinician: Betha Loa Referring Jabar Krysiak: Treating Shooter Tangen/Extender: Kerry Hough in Treatment: 8 Active Inactive Necrotic Tissue Nursing Diagnoses: Impaired tissue integrity related to necrotic/devitalized tissue Knowledge deficit related to management of necrotic/devitalized tissue Goals: Necrotic/devitalized tissue will be minimized in the wound bed Date Initiated: 10/20/2022 Target Resolution Date: 12/22/2022 Goal Status: Active Patient/caregiver will verbalize understanding of reason and process for debridement of necrotic tissue Date Initiated: 10/20/2022 Date Inactivated: 11/28/2022 Target Resolution Date: 11/20/2022 Goal Status: Met Interventions: Assess patient pain level pre-, during and post procedure and prior to discharge Provide education on necrotic tissue and debridement process Treatment Activities: Apply topical anesthetic as ordered : 10/18/2022 Notes: Electronic  Signature(s) Signed: 12/14/2022 3:23:16 PM By: Elliot Gurney, BSN, RN, CWS, [name] RN, BSN Signed: 12/15/2022 1:22:31 PM By: Betha Loa Entered By: Betha Loa on 12/14/2022 10:04:28 -------------------------------------------------------------------------------- Pain Assessment Details Patient Name: Date of Service: NO RMA [name] 12/14/2022 9:00 A M Medical Record Number: [id number] Patient Account Number: [id number] Date of Birth/Sex: Treating RN: [birth year] (72 y.o. Skip Mayer Primary Care Sherah Lund: Rodrigo Ran Other Clinician: Betha Loa Referring Kiely Cousar: Treating Nela Bascom/Extender: Kerry Hough in Treatment: 8 Active Problems Location of Pain Severity and Description of Pain Joyce, PETTAWAY Kaufman ([id number]) [id number] Page 5 of 7 Patient Has Paino Yes Site Locations Pain Location: Generalized Pain, Pain in Ulcers Duration of the Pain. Constant / Intermittento Constant Rate the pain. Current Pain Level: 4 Character of Pain Describe the Pain: Aching Pain Management and Medication Current Pain Management: Medication: Yes Cold Application: No Rest: No Massage: No Activity: No T.E.[name].S.: No Heat Application: No Leg drop or elevation: No Is the Current Pain Management Adequate: Inadequate How does your wound impact your activities of daily livingo Sleep: No Bathing: No Appetite: No Relationship With Others: No Bladder Continence: No Emotions: No Bowel Continence: No Work: No Toileting: No Drive: No Dressing: No Hobbies: No Psychologist, prison and probation services) Signed: 12/14/2022 3:23:16 PM By: Elliot Gurney, BSN, RN, CWS, [name] RN, BSN Signed: 12/15/2022 1:22:31 PM By: Betha Loa Entered By: Betha Loa on 12/14/2022 09:20:42 -------------------------------------------------------------------------------- Patient/Caregiver Education Details Patient Name: Date of Service: NO RMA [name] 4/25/2024andnbsp9:00 A  M Medical Record Number: [id number] Patient Account Number: [id number] Date of Birth/Gender: Treating RN: [birth year][name] (72 y.o. Skip Mayer Primary Care Physician: Rodrigo Ran Other Clinician: Betha Loa Referring Physician: Treating Physician/Extender: Kerry Hough in Treatment: 8 Education Assessment Education Provided To: Patient Education Topics Provided Wound/Skin Impairment: Handouts: Other: continue wound care as directed Methods: Explain/Verbal Responses: State content correctly Electronic Signature(s) Signed: 12/15/2022 1:22:31 PM By: Betha Loa Entered By: Betha Loa on 12/14/2022 10:04:54 [name] ([id number]) [id number] Page 6 of 7 -------------------------------------------------------------------------------- Wound Assessment Details Patient Name: Date of Service: NO RMA [name] 12/14/2022 9:00 A M Medical Record Number: [id number] Patient Account Number: [id number] Date of Birth/Sex: Treating RN: [name], [name] (72 y.o. Skip Mayer Primary Care Naveah Brave: Rodrigo Ran Other Clinician: Betha Loa Referring Deicy Rusk: Treating Coley Littles/Extender: Kerry Hough in Treatment: 8 Wound Status Wound Number: 1 Primary Etiology: Atypical Wound Location: Left, Medial Ankle Wound Status: Open Wounding Event: Surgical Injury Comorbid History: Hypertension, Neuropathy Date Acquired: 09/23/2022 Weeks Of Treatment: 8 Clustered Wound: No Photos Wound Measurements Length: (cm) 1.4 Width: (cm) 1.5 Depth: (cm) 0.2 Area: (  cm) 1.649 Volume: (cm) 0.33 % Reduction in Area: 34.4% % Reduction in Volume: 34.4% Epithelialization: None Wound Description Classification: Full Thickness Without Exposed Support Exudate Amount: Medium Exudate Type: Serosanguineous Exudate Color: red, brown Structures Foul Odor After Cleansing: No Slough/Fibrino Yes Wound Bed Granulation Amount: None  Present (0%) Exposed Structure Necrotic Amount: Large (67-100%) Fascia Exposed: No Necrotic Quality: Adherent Slough Fat Layer (Subcutaneous Tissue) Exposed: Yes Tendon Exposed: No Muscle Exposed: No Joint Exposed: No Bone Exposed: No Treatment Notes Wound #1 (Ankle) Wound Laterality: Left, Medial Cleanser Vashe 5.8 (oz) Discharge Instruction: Use vashe 5.8 (oz) as directed Peri-Wound Care Topical Primary Dressing Apligraf Secondary Dressing (BORDER) Zetuvit Plus SILICONE BORDER Dressing 5x5 (in/in) Discharge Instruction: Please do not put silicone bordered dressings under wraps. Use non-bordered dressing only. [name], [name] ([id number]) [id number] Page 7 of 7 Sorbact Discharge Instruction: In place of contact layer Secured With Steri-Strip 0.25x4 (in/in) Discharge Instruction: Apply Steri-Strip as directed Compression Wrap Compression Stockings Add-Ons Electronic Signature(s) Signed: 12/14/2022 3:23:16 PM By: Elliot Gurney, BSN, RN, CWS, [name] RN, BSN Signed: 12/15/2022 1:22:31 PM By: Betha Loa Entered By: Betha Loa on 12/14/2022 09:29:47 -------------------------------------------------------------------------------- Vitals Details Patient Name: Date of Service: NO RMA [name] 12/14/2022 9:00 A M Medical Record Number: [id number] Patient Account Number: [id number] Date of Birth/Sex: Treating RN: [birth year]/[name] (72 y.o. Cathlean Cower, [name] Primary Care Chyla Schlender: Rodrigo Ran Other Clinician: Betha Loa Referring Othell Jaime: Treating Keiondra Brookover/Extender: Kerry Hough in Treatment: 8 Vital Signs Time Taken: 09:18 Temperature (F): 97.6 Height (in): 61 Pulse (bpm): 59 Weight (lbs): 160 Respiratory Rate (breaths/min): 18 Body Mass Index (BMI): Kaufman.2 Blood Pressure (mmHg): 141/67 Reference Range: 80 - 120 mg / dl Electronic Signature(s) Signed: 12/15/2022 1:22:31 PM By: Betha Loa Entered By: Betha Loa on  12/14/2022 09:20:36

## 2022-12-18 DIAGNOSIS — M6281 Muscle weakness (generalized): Secondary | ICD-10-CM | POA: Diagnosis not present

## 2022-12-18 DIAGNOSIS — R293 Abnormal posture: Secondary | ICD-10-CM | POA: Diagnosis not present

## 2022-12-18 DIAGNOSIS — M545 Low back pain, unspecified: Secondary | ICD-10-CM | POA: Diagnosis not present

## 2022-12-18 DIAGNOSIS — M25511 Pain in right shoulder: Secondary | ICD-10-CM | POA: Diagnosis not present

## 2022-12-20 DIAGNOSIS — H02831 Dermatochalasis of right upper eyelid: Secondary | ICD-10-CM | POA: Diagnosis not present

## 2022-12-20 DIAGNOSIS — H02834 Dermatochalasis of left upper eyelid: Secondary | ICD-10-CM | POA: Diagnosis not present

## 2022-12-27 ENCOUNTER — Other Ambulatory Visit (HOSPITAL_BASED_OUTPATIENT_CLINIC_OR_DEPARTMENT_OTHER): Payer: Self-pay

## 2022-12-27 DIAGNOSIS — Z79899 Other long term (current) drug therapy: Secondary | ICD-10-CM | POA: Diagnosis not present

## 2022-12-27 DIAGNOSIS — Z5181 Encounter for therapeutic drug level monitoring: Secondary | ICD-10-CM | POA: Diagnosis not present

## 2022-12-27 DIAGNOSIS — M546 Pain in thoracic spine: Secondary | ICD-10-CM | POA: Diagnosis not present

## 2022-12-27 DIAGNOSIS — G894 Chronic pain syndrome: Secondary | ICD-10-CM | POA: Diagnosis not present

## 2022-12-27 DIAGNOSIS — M542 Cervicalgia: Secondary | ICD-10-CM | POA: Diagnosis not present

## 2022-12-27 MED ORDER — HYDROMORPHONE HCL 2 MG PO TABS
1.0000 mg | ORAL_TABLET | Freq: Four times a day (QID) | ORAL | 0 refills | Status: DC | PRN
  Filled 2022-12-27 – 2023-01-12 (×2): qty 30, 15d supply, fill #0

## 2022-12-28 ENCOUNTER — Encounter: Payer: Medicare Other | Attending: Physician Assistant | Admitting: Physician Assistant

## 2022-12-28 DIAGNOSIS — S81811A Laceration without foreign body, right lower leg, initial encounter: Secondary | ICD-10-CM | POA: Diagnosis not present

## 2022-12-28 DIAGNOSIS — L97322 Non-pressure chronic ulcer of left ankle with fat layer exposed: Secondary | ICD-10-CM | POA: Insufficient documentation

## 2022-12-28 DIAGNOSIS — Z7901 Long term (current) use of anticoagulants: Secondary | ICD-10-CM | POA: Diagnosis not present

## 2022-12-28 DIAGNOSIS — C4491 Basal cell carcinoma of skin, unspecified: Secondary | ICD-10-CM | POA: Diagnosis not present

## 2022-12-28 DIAGNOSIS — I482 Chronic atrial fibrillation, unspecified: Secondary | ICD-10-CM | POA: Insufficient documentation

## 2022-12-28 DIAGNOSIS — T8131XA Disruption of external operation (surgical) wound, not elsewhere classified, initial encounter: Secondary | ICD-10-CM | POA: Diagnosis not present

## 2022-12-28 DIAGNOSIS — X58XXXA Exposure to other specified factors, initial encounter: Secondary | ICD-10-CM | POA: Diagnosis not present

## 2022-12-28 NOTE — Progress Notes (Addendum)
[name], [name] ([id number]) [id number] Page 1 of 7 Visit Report for 12/28/2022 Chief Complaint Document Details Patient Name: Date of Service: NO RMA [name] 12/28/2022 8:30 A M Medical Record Number: [id number] Patient Account Number: [id number] Date of Birth/Sex: Treating RN: Apr 25, 1951 (72 y.o. [name] Primary Care Provider: Rodrigo Ran Other Clinician: Referring Provider: Treating Provider/Extender: Kerry Hough in Treatment: 10 Information Obtained from: Patient Chief Complaint 10/18/2022; left lower extremity wound status post [facility] removal with mohs surgery, right lower extremity skin tear from fall Electronic Signature(s) Signed: 12/28/2022 8:53:51 AM By: Allen Derry PA-C Entered By: Allen Derry on 12/28/2022 08:53:51 -------------------------------------------------------------------------------- Cellular or Tissue Based Product Details Patient Name: Date of Service: NO RMA [name] 12/28/2022 8:30 A M Medical Record Number: [id number] Patient Account Number: [id number] Date of Birth/Sex: Treating RN: 11-25-1950 (72 y.o. [name] Primary Care Provider: Rodrigo Ran Other Clinician: Referring Provider: Treating Provider/Extender: Kerry Hough in Treatment: 10 Cellular or Tissue Based Product Type Wound #1 Left,Medial Ankle Applied to: Performed By: Physician Allen Derry, PA-C Cellular or Tissue Based Product Type: Apligraf Level of Consciousness (Pre-procedure): Awake and Alert Pre-procedure Verification/Time Out Yes - 09:17 Taken: Location: trunk / arms / legs Wound Size (sq cm): 1 Product Size (sq cm): 44 Waste Size (sq cm): 33 Waste Reason: SMALLER WOUND Amount of Product Applied (sq cm): 11 Instrument Used: Forceps, Scissors Lot #: [id number] Expiration Date: 01/05/2023 Fenestrated: Yes Instrument: Blade Secured: Yes Secured With: Steri-Strips Procedural Pain:  0 Post Procedural Pain: 0 Response to Treatment: Procedure was tolerated well Level of Consciousness (Post- Awake and Alert procedure): Post Procedure Diagnosis Same as Pre-procedure Electronic Signature(s) Signed: 12/28/2022 3:10:23 PM By: Midge Aver MSN RN CNS WTA Entered By: Midge Aver on 12/28/2022 09:22:19 HPI Details -------------------------------------------------------------------------------- [name] ([id number]) [id number] Page 2 of 7 Patient Name: Date of Service: NO RMA [name] 12/28/2022 8:30 A M Medical Record Number: [id number] Patient Account Number: [id number] Date of Birth/Sex: Treating RN: 13-May-1951 (72 y.o. [name] Primary Care Provider: Rodrigo Ran Other Clinician: Referring Provider: Treating Provider/Extender: Kerry Hough in Treatment: 10 History of Present Illness HPI Description: 10/18/2022 [name] is a 72 year old female with a past medical history of A-fib on Eliquis, Wegener's granulomatosis with vasculitis, and basal cell carcinoma of the left leg that presents the clinic for a wound to the left leg status post Mohs procedure for basal cell carcinoma removal and skin tear to the right lower extremity that occurred 3 days ago after a fall. She has been using Vaseline to the wound beds. She has compression stockings and has been using these. She currently has chronic pain to the left leg wound site with slight erythema and warmth to the periwound. 3/6; patient presents for follow-up. She has been using Medihoney and Hydrofera Blue to the left lower extremity wound and Xeroform to the right lower extremity wound. She denies signs of infection. She completed her oral antibiotics. 11-07-2022 upon evaluation today patient appears to be doing well currently in regard to her wound. This actually did require some sharp debridement to clearway the necrotic debris. She  actually tolerated that debridement today well. 11-14-2022 upon evaluation today patient appears to be doing well currently in regard to her wound. There is some slough and biofilm noted on the surface of the wound currently but fortunately nothing that appears to be too significant. I do think  she is going to require some sharp debridement today. Fortunately I do not see any evidence of infection locally or systemically at this point. 11-21-2022 upon evaluation today patient appears to be doing well currently in regard to her wound although it is slow to heal she is making some slight progress. I think we should consider going forward with a skin sub-I discussed that with her today I think Apligraf would be a good idea. 11-28-2022 upon evaluation today patient is here today for her first application of Apligraf which she has actually been doing very well with. Fortunately there does not appear to be any signs of active infection at this time. 12-05-2022 upon evaluation today patient actually appears to be doing excellent in regard to her wound this is showing signs of good improvement I am very pleased in this regard. Fortunately I do not see any signs of active infection locally nor systemically which is great news. 12-14-2022 upon evaluation today patient appears to be doing excellent the treatment with the Apligraf does seem to have been extremely beneficial for her. I am actually very pleased with where things stand and I do believe that she is moving in the right direction here. 12-28-2022 upon evaluation today patient appears to be doing excellent with the Apligraf. She is actually here for application #4 today this looks to be doing great I think this will be our last application. Electronic Signature(s) Signed: 12/28/2022 9:40:45 AM By: Allen Derry PA-C Entered By: Allen Derry on 12/28/2022 09:40:45 -------------------------------------------------------------------------------- Physical Exam  Details Patient Name: Date of Service: NO RMA [name] 12/28/2022 8:30 A M Medical Record Number: [id number] Patient Account Number: [id number] Date of Birth/Sex: Treating RN: 11-17-1950 (72 y.o. [name] Primary Care Provider: Rodrigo Ran Other Clinician: Referring Provider: Treating Provider/Extender: Kerry Hough in Treatment: 10 Constitutional Well-nourished and well-hydrated in no acute distress. Respiratory normal breathing without difficulty. Psychiatric this patient is able to make decisions and demonstrates good insight into disease process. Alert and Oriented x 3. pleasant and cooperative. Notes Upon inspection patient's wound bed actually showed signs of good granulation and epithelization at this point. Fortunately I do not see any signs of active infection locally nor systemically which is great news and in general I do believe that we are moving in the right direction. No sharp debridement was necessary I cleaned with saline and gauze and then reapply the Apligraf for application #4. This was secured with Sorbact and Steri-Strips. Electronic Signature(s) Signed: 12/28/2022 9:41:09 AM By: Allen Derry PA-C Entered By: Allen Derry on 12/28/2022 09:41:09 [name] ([id number]) [id number] Page 3 of 7 -------------------------------------------------------------------------------- Physician Orders Details Patient Name: Date of Service: NO RMA [name] 12/28/2022 8:30 A M Medical Record Number: [id number] Patient Account Number: [id number] Date of Birth/Sex: Treating RN: Dec 29, 1950 (72 y.o. [name] Primary Care Provider: Rodrigo Ran Other Clinician: Referring Provider: Treating Provider/Extender: Kerry Hough in Treatment: 10 Verbal / Phone Orders: No Diagnosis Coding ICD-10 Coding Code Description T81.31XA Disruption of external operation (surgical) wound, not elsewhere  classified, initial encounter L97.322 Non-pressure chronic ulcer of left ankle with fat layer exposed C44.91 Basal cell carcinoma of skin, unspecified S81.811A Laceration without foreign body, right lower leg, initial encounter I48.20 Chronic atrial fibrillation, unspecified Z79.01 Long term (current) use of anticoagulants Follow-up Appointments Return Appointment in 1 week. Nurse Visit as needed Bathing/ Shower/ Hygiene Wound #1 Left,Medial Ankle Wash wounds with antibacterial soap and water. May  shower with wound dressing protected with water repellent cover or cast protector. No tub bath. Anesthetic (Use 'Patient Medications' Section for Anesthetic Order Entry) Lidocaine applied to wound bed Cellular or Tissue Based Products Wound #1 Left,Medial Ankle Cellular or Tissue Based Product Type: - Apligraf Cellular or Tissue Based Product applied to wound bed; including contact layer, fixation with steri-strips, dry gauze and cover dressing. (DO NOT REMOVE). - May change outer dressing only Edema Control - Lymphedema / Segmental Compressive Device / Other Wound #1 Left,Medial Ankle Patient to wear own compression stockings. Remove compression stockings every night before going to bed and put on every morning when getting up. - Bilat lower legs Elevate, Exercise Daily and A void Standing for Long Periods of Time. Wound Treatment Wound #1 - Ankle Wound Laterality: Left, Medial Cleanser: Vashe 5.8 (oz) 1 x Per Week/30 Days Discharge Instructions: Use vashe 5.8 (oz) as directed Topical: surgical lubricant 1 x Per Week/30 Days Discharge Instructions: apply light amount over sorbact then apply zetuvit Prim Dressing: Apligraf ary 1 x Per Week/30 Days Secondary Dressing: (BORDER) Zetuvit Plus SILICONE BORDER Dressing 5x5 (in/in) 1 x Per Week/30 Days Discharge Instructions: Please do not put silicone bordered dressings under wraps. Use non-bordered dressing only. Secondary Dressing: Sorbact 1  x Per Week/30 Days Discharge Instructions: In place of contact layer Secured With: Steri-Strip 0.25x4 (in/in) 1 x Per Week/30 Days Discharge Instructions: Apply Steri-Strip as directed Electronic Signature(s) Signed: 12/28/2022 3:10:23 PM By: Midge Aver MSN RN CNS WTA Signed: 12/28/2022 4:04:13 PM By: Allen Derry PA-C Entered By: Midge Aver on 12/28/2022 09:29:39 [name] ([id number]) [id number] Page 4 of 7 -------------------------------------------------------------------------------- Problem List Details Patient Name: Date of Service: NO RMA [name] 12/28/2022 8:30 A M Medical Record Number: [id number] Patient Account Number: [id number] Date of Birth/Sex: Treating RN: 1950/11/11 (72 y.o. [name] Primary Care Provider: Rodrigo Ran Other Clinician: Referring Provider: Treating Provider/Extender: Kerry Hough in Treatment: 10 Active Problems ICD-10 Encounter Code Description Active Date MDM Diagnosis T81.31XA Disruption of external operation (surgical) wound, not elsewhere classified, 10/18/2022 No Yes initial encounter L97.322 Non-pressure chronic ulcer of left ankle with fat layer exposed 10/18/2022 No Yes C44.91 Basal cell carcinoma of skin, unspecified 10/18/2022 No Yes S81.811A Laceration without foreign body, right lower leg, initial encounter 10/18/2022 No Yes I48.20 Chronic atrial fibrillation, unspecified 10/18/2022 No Yes Z79.01 Long term (current) use of anticoagulants 10/18/2022 No Yes Inactive Problems Resolved Problems Electronic Signature(s) Signed: 12/28/2022 3:10:23 PM By: Midge Aver MSN RN CNS WTA Signed: 12/28/2022 4:04:13 PM By: Allen Derry PA-C Previous Signature: 12/28/2022 8:53:46 AM Version By: Allen Derry PA-C Entered By: Midge Aver on 12/28/2022 09:30:37 -------------------------------------------------------------------------------- Progress Note Details Patient Name: Date of  Service: NO RMA [name] 12/28/2022 8:30 A M Medical Record Number: [id number] Patient Account Number: [id number] Date of Birth/Sex: Treating RN: Apr 16, 1951 (72 y.o. [name] Primary Care Provider: Rodrigo Ran Other Clinician: Referring Provider: Treating Provider/Extender: Kerry Hough in Treatment: 10 Subjective Chief Complaint Information obtained from Patient 10/18/2022; left lower extremity wound status post [facility] removal with mohs surgery, right lower extremity skin tear from fall History of Present Illness (HPI) 10/18/2022 Ms. Makaleigh Hausch is a 72 year old female with a past medical history of A-fib on Eliquis, Wegener's granulomatosis with vasculitis, and basal cell carcinoma of the left leg that presents the clinic for a wound to the left leg status post Mohs procedure for basal cell carcinoma removal and  skin tear to the right lower extremity that occurred 3 days ago after a fall. She has been using Vaseline to the wound beds. She has compression stockings and has been using [name], [name] ([id number]) [id number] Page 5 of 7 these. She currently has chronic pain to the left leg wound site with slight erythema and warmth to the periwound. 3/6; patient presents for follow-up. She has been using Medihoney and Hydrofera Blue to the left lower extremity wound and Xeroform to the right lower extremity wound. She denies signs of infection. She completed her oral antibiotics. 11-07-2022 upon evaluation today patient appears to be doing well currently in regard to her wound. This actually did require some sharp debridement to clearway the necrotic debris. She actually tolerated that debridement today well. 11-14-2022 upon evaluation today patient appears to be doing well currently in regard to her wound. There is some slough and biofilm noted on the surface of the wound currently but fortunately nothing that appears to be  too significant. I do think she is going to require some sharp debridement today. Fortunately I do not see any evidence of infection locally or systemically at this point. 11-21-2022 upon evaluation today patient appears to be doing well currently in regard to her wound although it is slow to heal she is making some slight progress. I think we should consider going forward with a skin sub-I discussed that with her today I think Apligraf would be a good idea. 11-28-2022 upon evaluation today patient is here today for her first application of Apligraf which she has actually been doing very well with. Fortunately there does not appear to be any signs of active infection at this time. 12-05-2022 upon evaluation today patient actually appears to be doing excellent in regard to her wound this is showing signs of good improvement I am very pleased in this regard. Fortunately I do not see any signs of active infection locally nor systemically which is great news. 12-14-2022 upon evaluation today patient appears to be doing excellent the treatment with the Apligraf does seem to have been extremely beneficial for her. I am actually very pleased with where things stand and I do believe that she is moving in the right direction here. 12-28-2022 upon evaluation today patient appears to be doing excellent with the Apligraf. She is actually here for application #4 today this looks to be doing great I think this will be our last application. Objective Constitutional Well-nourished and well-hydrated in no acute distress. Vitals Time Taken: 8:56 AM, Height: 61 in, Weight: 160 lbs, BMI: 30.2, Temperature: 98.0 F, Pulse: 59 bpm, Respiratory Rate: 16 breaths/min, Blood Pressure: 135/64 mmHg. Respiratory normal breathing without difficulty. Psychiatric this patient is able to make decisions and demonstrates good insight into disease process. Alert and Oriented x 3. pleasant and cooperative. General Notes: Upon inspection  patient's wound bed actually showed signs of good granulation and epithelization at this point. Fortunately I do not see any signs of active infection locally nor systemically which is great news and in general I do believe that we are moving in the right direction. No sharp debridement was necessary I cleaned with saline and gauze and then reapply the Apligraf for application #4. This was secured with Sorbact and Steri-Strips. Integumentary (Hair, Skin) Wound #1 status is Open. Original cause of wound was Surgical Injury. The date acquired was: 09/23/2022. The wound has been in treatment 10 weeks. The wound is located on the Left,Medial Ankle. The wound measures 1cm length x  1cm width x 0.2cm depth; 0.785cm^2 area and 0.157cm^3 volume. There is Fat Layer (Subcutaneous Tissue) exposed. There is a medium amount of serosanguineous drainage noted. There is no granulation within the wound bed. There is a large (67-100%) amount of necrotic tissue within the wound bed including Adherent Slough. Assessment Active Problems ICD-10 Disruption of external operation (surgical) wound, not elsewhere classified, initial encounter Non-pressure chronic ulcer of left ankle with fat layer exposed Basal cell carcinoma of skin, unspecified Laceration without foreign body, right lower leg, initial encounter Chronic atrial fibrillation, unspecified Long term (current) use of anticoagulants Procedures Wound #1 Pre-procedure diagnosis of Wound #1 is an Atypical located on the Left,Medial Ankle. A skin graft procedure using a bioengineered skin substitute/cellular or tissue based product was performed by Allen Derry, PA-C with the following instrument(s): Forceps and Scissors. Apligraf was applied and secured with Steri- Strips. 11 sq cm of product was utilized and 33 sq cm was wasted due to SMALLER WOUND. A Time Out was conducted at 09:17, prior to the start of the procedure. The procedure was tolerated well with a pain  level of 0 throughout and a pain level of 0 following the procedure. [name], [name] ([id number]) [id number] Page 6 of 7 Post procedure Diagnosis Wound #1: Same as Pre-Procedure . Plan Follow-up Appointments: Return Appointment in 1 week. Nurse Visit as needed Bathing/ Shower/ Hygiene: Wound #1 Left,Medial Ankle: Wash wounds with antibacterial soap and water. May shower with wound dressing protected with water repellent cover or cast protector. No tub bath. Anesthetic (Use 'Patient Medications' Section for Anesthetic Order Entry): Lidocaine applied to wound bed Cellular or Tissue Based Products: Wound #1 Left,Medial Ankle: Cellular or Tissue Based Product Type: - Apligraf Cellular or Tissue Based Product applied to wound bed; including contact layer, fixation with steri-strips, dry gauze and cover dressing. (DO NOT REMOVE). - May change outer dressing only Edema Control - Lymphedema / Segmental Compressive Device / Other: Wound #1 Left,Medial Ankle: Patient to wear own compression stockings. Remove compression stockings every night before going to bed and put on every morning when getting up. - Bilat lower legs Elevate, Exercise Daily and Avoid Standing for Long Periods of Time. WOUND #1: - Ankle Wound Laterality: Left, Medial Cleanser: Vashe 5.8 (oz) 1 x Per Week/30 Days Discharge Instructions: Use vashe 5.8 (oz) as directed Topical: surgical lubricant 1 x Per Week/30 Days Discharge Instructions: apply light amount over sorbact then apply zetuvit Prim Dressing: Apligraf 1 x Per Week/30 Days ary Secondary Dressing: (BORDER) Zetuvit Plus SILICONE BORDER Dressing 5x5 (in/in) 1 x Per Week/30 Days Discharge Instructions: Please do not put silicone bordered dressings under wraps. Use non-bordered dressing only. Secondary Dressing: Sorbact 1 x Per Week/30 Days Discharge Instructions: In place of contact layer Secured With: Steri-Strip 0.25x4 (in/in) 1 x  Per Week/30 Days Discharge Instructions: Apply Steri-Strip as directed 1. Based on what I am seeing I do believe that the patient is really making good progress here and I would recommend that we continue as such with the Apligraf today though this will be our last application. 2. Also can recommend that she should continue to monitor for any signs of infection or worsening. Based on what I am seeing I believe that we are making really good progress currently. We will see patient back for reevaluation in 1 week here in the clinic. If anything worsens or changes patient will contact our office for additional recommendations. When she changes the dressings at home she should apply some of  the K-Y jelly as well which will keep this from drying out. Electronic Signature(s) Signed: 12/28/2022 9:41:38 AM By: Allen Derry PA-C Entered By: Allen Derry on 12/28/2022 09:41:38 -------------------------------------------------------------------------------- SuperBill Details Patient Name: Date of Service: NO RMA [name] 12/28/2022 Medical Record Number: [id number] Patient Account Number: [id number] Date of Birth/Sex: Treating RN: 13-Jan-1951 (72 y.o. [name] Primary Care Provider: Rodrigo Ran Other Clinician: Referring Provider: Treating Provider/Extender: Kerry Hough in Treatment: 10 Diagnosis Coding ICD-10 Codes Code Description T81.31XA Disruption of external operation (surgical) wound, not elsewhere classified, initial encounter L97.322 Non-pressure chronic ulcer of left ankle with fat layer exposed C44.91 Basal cell carcinoma of skin, unspecified S81.811A Laceration without foreign body, right lower leg, initial encounter I48.20 Chronic atrial fibrillation, unspecified Z79.01 Long term (current) use of anticoagulants [name], [name] ([id number]) [id number] Page 7 of 7 Facility Procedures : CPT4 Code: [id number] Description:  [phone] (Facility Use Only) Apligraf 44 SQ CM Modifier: Quantity: 44 : CPT4 Code: [id number] Description: 15271 - SKIN SUB GRAFT TRNK/ARM/LEG ICD-10 Diagnosis Description L97.322 Non-pressure chronic ulcer of left ankle with fat layer exposed Modifier: Quantity: 1 Physician Procedures : CPT4 Code Description Modifier [id number] 15271 - WC PHYS SKIN SUB GRAFT TRNK/ARM/LEG ICD-10 Diagnosis Description L97.322 Non-pressure chronic ulcer of left ankle with fat layer exposed Quantity: 1 Electronic Signature(s) Signed: 12/28/2022 9:45:23 AM By: Allen Derry PA-C Entered By: Allen Derry on 12/28/2022 09:45:23

## 2022-12-28 NOTE — Progress Notes (Addendum)
[name], [name] ([id number]) [id number] Page 1 of 7 Visit Report for 12/28/2022 Arrival Information Details Patient Name: Date of Service: NO RMA [name] 12/28/2022 8:30 A M Medical Record Number: [id number] Patient Account Number: [id number] Date of Birth/Sex: Treating RN: 1951/01/25 (72 y.o. [name] Primary Care Avabella Wailes: Rodrigo Ran Other Clinician: Referring Fines Kimberlin: Treating [place] Virrueta/Extender: Kerry Hough in Treatment: 10 Visit Information History Since Last Visit Added or deleted any medications: No Patient Arrived: Ambulatory Has Dressing in Place as Prescribed: Yes Arrival Time: 08:47 Pain Present Now: No Accompanied By: self Transfer Assistance: None Patient Identification Verified: Yes Secondary Verification Process Completed: Yes Patient Requires Transmission-Based Precautions: No Patient Has Alerts: Yes Patient Alerts: Patient on Blood Thinner Eliquis//ASA Not Diabetic Electronic Signature(s) Signed: 12/28/2022 3:10:23 PM By: Midge Aver MSN RN CNS WTA Entered By: Midge Aver on 12/28/2022 08:56:48 -------------------------------------------------------------------------------- Clinic Level of Care Assessment Details Patient Name: Date of Service: NO RMA [name] 12/28/2022 8:30 A M Medical Record Number: [id number] Patient Account Number: [id number] Date of Birth/Sex: Treating RN: July 19, 1951 (72 y.o. [name] Primary Care Dallis Darden: Rodrigo Ran Other Clinician: Referring Kaylla Cobos: Treating Teigan Sahli/Extender: Kerry Hough in Treatment: 10 Clinic Level of Care Assessment Items TOOL 1 Quantity Score []  - 0 Use when EandM and Procedure is performed on INITIAL visit ASSESSMENTS - Nursing Assessment / Reassessment []  - 0 General Physical Exam (combine w/ comprehensive assessment (listed just below) when performed on new pt. evals) []  - 0 Comprehensive Assessment (HX,  ROS, Risk Assessments, Wounds Hx, etc.) ASSESSMENTS - Wound and Skin Assessment / Reassessment []  - 0 Dermatologic / Skin Assessment (not related to wound area) ASSESSMENTS - Ostomy and/or Continence Assessment and Care []  - 0 Incontinence Assessment and Management []  - 0 Ostomy Care Assessment and Management (repouching, etc.) PROCESS - Coordination of Care []  - 0 Simple Patient / Family Education for ongoing care []  - 0 Complex (extensive) Patient / Family Education for ongoing care []  - 0 Staff obtains Chiropractor, Records, T Results / Process Orders est []  - 0 Staff telephones HHA, Nursing Homes / Clarify orders / etc []  - 0 Routine Transfer to another Facility (non-emergent condition) []  - 0 Routine Hospital Admission (non-emergent condition) []  - 0 New Admissions / Insurance Authorizations / Ordering NPWT Apligraf, etc. , []  - 0 Emergency Hospital Admission (emergent condition) [name], [name] ([id number]) [id number] Page 2 of 7 PROCESS - Special Needs []  - 0 Pediatric / Minor Patient Management []  - 0 Isolation Patient Management []  - 0 Hearing / Language / Visual special needs []  - 0 Assessment of Community assistance (transportation, D/C planning, etc.) []  - 0 Additional assistance / Altered mentation []  - 0 Support Surface(s) Assessment (bed, cushion, seat, etc.) INTERVENTIONS - Miscellaneous []  - 0 External ear exam []  - 0 Patient Transfer (multiple staff / Nurse, adult / Similar devices) []  - 0 Simple Staple / Suture removal (25 or less) []  - 0 Complex Staple / Suture removal (26 or more) []  - 0 Hypo/Hyperglycemic Management (do not check if billed separately) []  - 0 Ankle / Brachial Index (ABI) - do not check if billed separately Has the patient been seen at the hospital within the last three years: Yes Total Score: 0 Level Of Care: ____ Electronic Signature(s) Signed: 12/28/2022 3:10:23 PM By: Midge Aver MSN RN CNS  WTA Entered By: Midge Aver on 12/28/2022 09:29:47 -------------------------------------------------------------------------------- Encounter Discharge Information Details Patient Name: Date of Service: NO  RMA [name] 12/28/2022 8:30 A M Medical Record Number: [id number] Patient Account Number: [id number] Date of Birth/Sex: Treating RN: 12-19-1950 (72 y.o. [name] Primary Care Vrishank Moster: Rodrigo Ran Other Clinician: Referring Armie Moren: Treating Jerzy Crotteau/Extender: Kerry Hough in Treatment: 10 Encounter Discharge Information Items Post Procedure Vitals Discharge Condition: Stable Temperature (F): 98.0 Ambulatory Status: Ambulatory Pulse (bpm): 59 Discharge Destination: Home Respiratory Rate (breaths/min): 16 Transportation: Private Auto Blood Pressure (mmHg): 135/64 Accompanied By: self Schedule Follow-up Appointment: Yes Clinical Summary of Care: Electronic Signature(s) Signed: 12/28/2022 3:10:23 PM By: Midge Aver MSN RN CNS WTA Entered By: Midge Aver on 12/28/2022 09:57:54 -------------------------------------------------------------------------------- Lower Extremity Assessment Details Patient Name: Date of Service: NO RMA [name] 12/28/2022 8:30 A M Medical Record Number: [id number] Patient Account Number: [id number] Date of Birth/Sex: Treating RN: 1951-06-17 (72 y.o. [name] Primary Care Amelianna Meller: Rodrigo Ran Other Clinician: Referring Debar Plate: Treating Zohal Reny/Extender: [place] Faster Weeks in Treatment: 10 Edema Assessment Assessed: Kyra Searles: Yes] Franne Forts: No] Edema: [Left: N] [Right: o] N[LeftSKYRA, [name] ([id number])] [Right: [id number] Page 3 of 7] Calf Left: Right: Point of Measurement: 33 cm From Medial Instep 34.5 cm Ankle Left: Right: Point of Measurement: 12 cm From Medial Instep 22.2 cm Knee To Floor Left: Right: From Medial Instep 41 cm Vascular  Assessment Pulses: Dorsalis Pedis Palpable: [Left:Yes] Electronic Signature(s) Signed: 12/28/2022 3:10:23 PM By: Midge Aver MSN RN CNS WTA Entered By: Midge Aver on 12/28/2022 09:05:28 -------------------------------------------------------------------------------- Multi Wound Chart Details Patient Name: Date of Service: NO RMA [name] 12/28/2022 8:30 A M Medical Record Number: [id number] Patient Account Number: [id number] Date of Birth/Sex: Treating RN: 1950/10/06 (72 y.o. [name] Primary Care Jameyah Fennewald: Rodrigo Ran Other Clinician: Referring Mukhtar Shams: Treating Alliyah Roesler/Extender: Kerry Hough in Treatment: 10 Vital Signs Height(in): 61 Pulse(bpm): 59 Weight(lbs): 160 Blood Pressure(mmHg): 135/64 Body Mass Index(BMI): 30.2 Temperature(F): 98.0 Respiratory Rate(breaths/min): 16 [1:Photos:] [N/A:N/A] Left, Medial Ankle N/A N/A Wound Location: Surgical Injury N/A N/A Wounding Event: Atypical N/A N/A Primary Etiology: Hypertension, Neuropathy N/A N/A Comorbid History: 09/23/2022 N/A N/A Date Acquired: 10 N/A N/A Weeks of Treatment: Open N/A N/A Wound Status: No N/A N/A Wound Recurrence: 1x1x0.2 N/A N/A Measurements L x W x D (cm) 0.785 N/A N/A A (cm) : rea 0.157 N/A N/A Volume (cm) : 68.80% N/A N/A % Reduction in A rea: 68.80% N/A N/A % Reduction in Volume: Full Thickness Without Exposed N/A N/A Classification: Support Structures Medium N/A N/A Exudate Amount: Serosanguineous N/A N/A Exudate Type: red, brown N/A N/A Exudate Color: None Present (0%) N/A N/A Granulation Amount: Large (67-100%) N/A N/A Necrotic Amount: Fat Layer (Subcutaneous Tissue): Yes N/A N/A Exposed Structures: Fascia: No [name], [name] ([id number]) [id number] Page 4 of 7 Tendon: No Muscle: No Joint: No Bone: No None N/A N/A Epithelialization: Treatment Notes Electronic Signature(s) Signed: 12/28/2022 3:10:23  PM By: Midge Aver MSN RN CNS WTA Entered By: Midge Aver on 12/28/2022 09:05:47 -------------------------------------------------------------------------------- Multi-Disciplinary Care Plan Details Patient Name: Date of Service: NO RMA [name] 12/28/2022 8:30 A M Medical Record Number: [id number] Patient Account Number: [id number] Date of Birth/Sex: Treating RN: 12-18-1950 (72 y.o. [name] Primary Care Gaelen Brager: Rodrigo Ran Other Clinician: Referring Firas Guardado: Treating Travus Oren/Extender: Kerry Hough in Treatment: 10 Active Inactive Necrotic Tissue Nursing Diagnoses: Impaired tissue integrity related to necrotic/devitalized tissue Knowledge deficit related to management of necrotic/devitalized tissue Goals: Necrotic/devitalized tissue will be minimized in the wound  bed Date Initiated: 10/20/2022 Target Resolution Date: 01/05/2023 Goal Status: Active Patient/caregiver will verbalize understanding of reason and process for debridement of necrotic tissue Date Initiated: 10/20/2022 Date Inactivated: 11/28/2022 Target Resolution Date: 11/20/2022 Goal Status: Met Interventions: Assess patient pain level pre-, during and post procedure and prior to discharge Provide education on necrotic tissue and debridement process Treatment Activities: Apply topical anesthetic as ordered : 10/18/2022 Notes: Electronic Signature(s) Signed: 12/28/2022 3:10:23 PM By: Midge Aver MSN RN CNS WTA Entered By: Midge Aver on 12/28/2022 09:30:18 -------------------------------------------------------------------------------- Pain Assessment Details Patient Name: Date of Service: NO RMA [name] 12/28/2022 8:30 A M Medical Record Number: [id number] Patient Account Number: [id number] Date of Birth/Sex: Treating RN: [birth year] (72 y.o. [name] Primary Care Evalyse Stroope: Rodrigo Ran Other Clinician: Referring Zaina Jenkin: Treating Americus Perkey/Extender: Kerry Hough in Treatment: 10 Active Problems Location of Pain Severity and Description of Pain Patient Has Paino No Site Locations [place], [place] ([id number]) [phone].pdf Page 5 of 7 Pain Management and Medication Current Pain Management: Electronic Signature(s) Signed: 12/28/2022 3:10:23 PM By: Midge Aver MSN RN CNS WTA Entered By: Midge Aver on 12/28/2022 08:59:55 -------------------------------------------------------------------------------- Patient/Caregiver Education Details Patient Name: Date of Service: NO RMA [name] 5/9/2024andnbsp8:30 A M Medical Record Number: [id number] Patient Account Number: [id number] Date of Birth/Gender: Treating RN: [birth year] (72 y.o. [name] Primary Care Physician: Rodrigo Ran Other Clinician: Referring Physician: Treating Physician/Extender: Kerry Hough in Treatment: 10 Education Assessment Education Provided To: Patient Education Topics Provided Wound/Skin Impairment: Handouts: Caring for Your Ulcer Methods: Explain/Verbal Responses: State content correctly Electronic Signature(s) Signed: 12/28/2022 3:10:23 PM By: Midge Aver MSN RN CNS WTA Entered By: Midge Aver on 12/28/2022 09:30:29 -------------------------------------------------------------------------------- Wound Assessment Details Patient Name: Date of Service: NO RMA [name] 12/28/2022 8:30 A M Medical Record Number: [id number] Patient Account Number: [id number] Date of Birth/Sex: Treating RN: 01/27/1951 (72 y.o. [name] Primary Care Giannie Soliday: Rodrigo Ran Other Clinician: Referring Larry Alcock: Treating Margarit Minshall/Extender: [place] Faster Weeks in Treatment: 10 Wound Status Wound Number: 1 Primary Etiology: Atypical Wound Location: Left, Medial Ankle Wound Status: Open Wounding Event: Surgical Injury Comorbid History: Hypertension, Neuropathy Date Acquired: 09/23/2022 [facility] Of  Treatment: [address] [place] ([id number]) [id number] Page 6 of 7 Clustered Wound: No Photos Wound Measurements Length: (cm) 1 Width: (cm) 1 Depth: (cm) 0.2 Area: (cm) 0.785 Volume: (cm) 0.157 % Reduction in Area: 68.8% % Reduction in Volume: 68.8% Epithelialization: None Wound Description Classification: Full Thickness Without Exposed Support Exudate Amount: Medium Exudate Type: Serosanguineous Exudate Color: red, brown Structures Foul Odor After Cleansing: No Slough/Fibrino Yes Wound Bed Granulation Amount: None Present (0%) Exposed Structure Necrotic Amount: Large (67-100%) Fascia Exposed: No Necrotic Quality: Adherent Slough Fat Layer (Subcutaneous Tissue) Exposed: Yes Tendon Exposed: No Muscle Exposed: No Joint Exposed: No Bone Exposed: No Treatment Notes Wound #1 (Ankle) Wound Laterality: Left, Medial Cleanser Vashe 5.8 (oz) Discharge Instruction: Use vashe 5.8 (oz) as directed Peri-Wound Care Topical surgical lubricant Discharge Instruction: apply light amount over sorbact then apply zetuvit Primary Dressing Apligraf Secondary Dressing (BORDER) Zetuvit Plus SILICONE BORDER Dressing 5x5 (in/in) Discharge Instruction: Please do not put silicone bordered dressings under wraps. Use non-bordered dressing only. Sorbact Discharge Instruction: In place of contact layer Secured With Steri-Strip 0.25x4 (in/in) Discharge Instruction: Apply Steri-Strip as directed Compression Wrap Compression Stockings Add-Ons Electronic Signature(s) Signed: 12/28/2022 3:10:23 PM By: Midge Aver MSN RN CNS WTA Entered By: Midge Aver on 12/28/2022 09:04:18  [name], [name] ([id number]) [id number] Page 7 of 7 -------------------------------------------------------------------------------- Vitals Details Patient Name: Date of Service: NO RMA [name] 12/28/2022 8:30 A M Medical Record Number: [id number] Patient  Account Number: [id number] Date of Birth/Sex: Treating RN: [birth year] (72 y.o. [name] Primary Care Kennie Karapetian: Rodrigo Ran Other Clinician: Referring Rionna Feltes: Treating [place] Lightcap/Extender: Kerry Hough in Treatment: 10 Vital Signs Time Taken: 08:56 Temperature (F): 98.0 Height (in): 61 Pulse (bpm): 59 Weight (lbs): 160 Respiratory Rate (breaths/min): 16 Body Mass Index (BMI): 30.2 Blood Pressure (mmHg): 135/64 Reference Range: 80 - 120 mg / dl Electronic Signature(s) Signed: 12/28/2022 3:10:23 PM By: Midge Aver MSN RN CNS WTA Entered By: Midge Aver on 12/28/2022 08:59:27

## 2023-01-02 ENCOUNTER — Other Ambulatory Visit (HOSPITAL_BASED_OUTPATIENT_CLINIC_OR_DEPARTMENT_OTHER): Payer: Self-pay

## 2023-01-03 ENCOUNTER — Other Ambulatory Visit (HOSPITAL_BASED_OUTPATIENT_CLINIC_OR_DEPARTMENT_OTHER): Payer: Self-pay

## 2023-01-03 DIAGNOSIS — D044 Carcinoma in situ of skin of scalp and neck: Secondary | ICD-10-CM | POA: Diagnosis not present

## 2023-01-04 DIAGNOSIS — M313 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: Secondary | ICD-10-CM | POA: Diagnosis not present

## 2023-01-04 DIAGNOSIS — Z7960 Long term (current) use of unspecified immunomodulators and immunosuppressants: Secondary | ICD-10-CM | POA: Diagnosis not present

## 2023-01-10 ENCOUNTER — Other Ambulatory Visit (HOSPITAL_BASED_OUTPATIENT_CLINIC_OR_DEPARTMENT_OTHER): Payer: Self-pay

## 2023-01-11 ENCOUNTER — Encounter: Payer: Medicare Other | Admitting: Physician Assistant

## 2023-01-11 DIAGNOSIS — Z7901 Long term (current) use of anticoagulants: Secondary | ICD-10-CM | POA: Diagnosis not present

## 2023-01-11 DIAGNOSIS — S81811A Laceration without foreign body, right lower leg, initial encounter: Secondary | ICD-10-CM | POA: Diagnosis not present

## 2023-01-11 DIAGNOSIS — L97322 Non-pressure chronic ulcer of left ankle with fat layer exposed: Secondary | ICD-10-CM | POA: Diagnosis not present

## 2023-01-11 DIAGNOSIS — I482 Chronic atrial fibrillation, unspecified: Secondary | ICD-10-CM | POA: Diagnosis not present

## 2023-01-11 DIAGNOSIS — T8131XA Disruption of external operation (surgical) wound, not elsewhere classified, initial encounter: Secondary | ICD-10-CM | POA: Diagnosis not present

## 2023-01-11 DIAGNOSIS — C4491 Basal cell carcinoma of skin, unspecified: Secondary | ICD-10-CM | POA: Diagnosis not present

## 2023-01-12 ENCOUNTER — Other Ambulatory Visit (HOSPITAL_BASED_OUTPATIENT_CLINIC_OR_DEPARTMENT_OTHER): Payer: Self-pay

## 2023-01-16 DIAGNOSIS — M542 Cervicalgia: Secondary | ICD-10-CM | POA: Diagnosis not present

## 2023-01-16 DIAGNOSIS — M546 Pain in thoracic spine: Secondary | ICD-10-CM | POA: Diagnosis not present

## 2023-01-16 DIAGNOSIS — M5451 Vertebrogenic low back pain: Secondary | ICD-10-CM | POA: Diagnosis not present

## 2023-01-18 DIAGNOSIS — M546 Pain in thoracic spine: Secondary | ICD-10-CM | POA: Diagnosis not present

## 2023-01-18 DIAGNOSIS — M542 Cervicalgia: Secondary | ICD-10-CM | POA: Diagnosis not present

## 2023-01-18 DIAGNOSIS — M5451 Vertebrogenic low back pain: Secondary | ICD-10-CM | POA: Diagnosis not present

## 2023-01-19 ENCOUNTER — Encounter: Payer: Medicare Other | Admitting: Physician Assistant

## 2023-01-19 DIAGNOSIS — C4491 Basal cell carcinoma of skin, unspecified: Secondary | ICD-10-CM | POA: Diagnosis not present

## 2023-01-19 DIAGNOSIS — L97322 Non-pressure chronic ulcer of left ankle with fat layer exposed: Secondary | ICD-10-CM | POA: Diagnosis not present

## 2023-01-19 DIAGNOSIS — I482 Chronic atrial fibrillation, unspecified: Secondary | ICD-10-CM | POA: Diagnosis not present

## 2023-01-19 DIAGNOSIS — S81811A Laceration without foreign body, right lower leg, initial encounter: Secondary | ICD-10-CM | POA: Diagnosis not present

## 2023-01-19 DIAGNOSIS — T8131XA Disruption of external operation (surgical) wound, not elsewhere classified, initial encounter: Secondary | ICD-10-CM | POA: Diagnosis not present

## 2023-01-19 DIAGNOSIS — Z7901 Long term (current) use of anticoagulants: Secondary | ICD-10-CM | POA: Diagnosis not present

## 2023-01-23 DIAGNOSIS — L578 Other skin changes due to chronic exposure to nonionizing radiation: Secondary | ICD-10-CM | POA: Diagnosis not present

## 2023-01-23 DIAGNOSIS — D1801 Hemangioma of skin and subcutaneous tissue: Secondary | ICD-10-CM | POA: Diagnosis not present

## 2023-01-23 DIAGNOSIS — L821 Other seborrheic keratosis: Secondary | ICD-10-CM | POA: Diagnosis not present

## 2023-01-23 DIAGNOSIS — Z85828 Personal history of other malignant neoplasm of skin: Secondary | ICD-10-CM | POA: Diagnosis not present

## 2023-01-23 DIAGNOSIS — L814 Other melanin hyperpigmentation: Secondary | ICD-10-CM | POA: Diagnosis not present

## 2023-01-23 DIAGNOSIS — L570 Actinic keratosis: Secondary | ICD-10-CM | POA: Diagnosis not present

## 2023-01-23 DIAGNOSIS — D229 Melanocytic nevi, unspecified: Secondary | ICD-10-CM | POA: Diagnosis not present

## 2023-01-24 DIAGNOSIS — M542 Cervicalgia: Secondary | ICD-10-CM | POA: Diagnosis not present

## 2023-01-24 DIAGNOSIS — M5451 Vertebrogenic low back pain: Secondary | ICD-10-CM | POA: Diagnosis not present

## 2023-01-24 DIAGNOSIS — M546 Pain in thoracic spine: Secondary | ICD-10-CM | POA: Diagnosis not present

## 2023-01-26 DIAGNOSIS — M546 Pain in thoracic spine: Secondary | ICD-10-CM | POA: Diagnosis not present

## 2023-01-26 DIAGNOSIS — M5451 Vertebrogenic low back pain: Secondary | ICD-10-CM | POA: Diagnosis not present

## 2023-01-26 DIAGNOSIS — M542 Cervicalgia: Secondary | ICD-10-CM | POA: Diagnosis not present

## 2023-01-30 DIAGNOSIS — M5451 Vertebrogenic low back pain: Secondary | ICD-10-CM | POA: Diagnosis not present

## 2023-01-30 DIAGNOSIS — M542 Cervicalgia: Secondary | ICD-10-CM | POA: Diagnosis not present

## 2023-01-30 DIAGNOSIS — M546 Pain in thoracic spine: Secondary | ICD-10-CM | POA: Diagnosis not present

## 2023-02-01 ENCOUNTER — Ambulatory Visit: Payer: Medicare Other | Admitting: Physician Assistant

## 2023-02-02 DIAGNOSIS — M5451 Vertebrogenic low back pain: Secondary | ICD-10-CM | POA: Diagnosis not present

## 2023-02-02 DIAGNOSIS — M542 Cervicalgia: Secondary | ICD-10-CM | POA: Diagnosis not present

## 2023-02-02 DIAGNOSIS — M546 Pain in thoracic spine: Secondary | ICD-10-CM | POA: Diagnosis not present

## 2023-02-06 ENCOUNTER — Other Ambulatory Visit (HOSPITAL_BASED_OUTPATIENT_CLINIC_OR_DEPARTMENT_OTHER): Payer: Self-pay

## 2023-02-06 DIAGNOSIS — M5451 Vertebrogenic low back pain: Secondary | ICD-10-CM | POA: Diagnosis not present

## 2023-02-06 DIAGNOSIS — M546 Pain in thoracic spine: Secondary | ICD-10-CM | POA: Diagnosis not present

## 2023-02-06 DIAGNOSIS — M542 Cervicalgia: Secondary | ICD-10-CM | POA: Diagnosis not present

## 2023-02-06 MED ORDER — HYDROMORPHONE HCL 2 MG PO TABS
1.0000 mg | ORAL_TABLET | Freq: Four times a day (QID) | ORAL | 0 refills | Status: AC | PRN
  Filled 2023-02-06: qty 30, 15d supply, fill #0

## 2023-02-08 DIAGNOSIS — M48061 Spinal stenosis, lumbar region without neurogenic claudication: Secondary | ICD-10-CM | POA: Diagnosis not present

## 2023-02-08 DIAGNOSIS — M4807 Spinal stenosis, lumbosacral region: Secondary | ICD-10-CM | POA: Diagnosis not present

## 2023-02-08 DIAGNOSIS — M5137 Other intervertebral disc degeneration, lumbosacral region: Secondary | ICD-10-CM | POA: Diagnosis not present

## 2023-02-08 DIAGNOSIS — M4317 Spondylolisthesis, lumbosacral region: Secondary | ICD-10-CM | POA: Diagnosis not present

## 2023-02-08 DIAGNOSIS — M47816 Spondylosis without myelopathy or radiculopathy, lumbar region: Secondary | ICD-10-CM | POA: Diagnosis not present

## 2023-02-08 DIAGNOSIS — M47817 Spondylosis without myelopathy or radiculopathy, lumbosacral region: Secondary | ICD-10-CM | POA: Diagnosis not present

## 2023-02-09 DIAGNOSIS — M542 Cervicalgia: Secondary | ICD-10-CM | POA: Diagnosis not present

## 2023-02-09 DIAGNOSIS — M546 Pain in thoracic spine: Secondary | ICD-10-CM | POA: Diagnosis not present

## 2023-02-09 DIAGNOSIS — M5451 Vertebrogenic low back pain: Secondary | ICD-10-CM | POA: Diagnosis not present

## 2023-02-12 DIAGNOSIS — H43813 Vitreous degeneration, bilateral: Secondary | ICD-10-CM | POA: Diagnosis not present

## 2023-02-13 ENCOUNTER — Other Ambulatory Visit (HOSPITAL_BASED_OUTPATIENT_CLINIC_OR_DEPARTMENT_OTHER): Payer: Self-pay

## 2023-02-16 DIAGNOSIS — M546 Pain in thoracic spine: Secondary | ICD-10-CM | POA: Diagnosis not present

## 2023-02-16 DIAGNOSIS — M5451 Vertebrogenic low back pain: Secondary | ICD-10-CM | POA: Diagnosis not present

## 2023-02-16 DIAGNOSIS — M542 Cervicalgia: Secondary | ICD-10-CM | POA: Diagnosis not present

## 2023-02-28 DIAGNOSIS — G894 Chronic pain syndrome: Secondary | ICD-10-CM | POA: Diagnosis not present

## 2023-02-28 DIAGNOSIS — M542 Cervicalgia: Secondary | ICD-10-CM | POA: Diagnosis not present

## 2023-02-28 DIAGNOSIS — M47896 Other spondylosis, lumbar region: Secondary | ICD-10-CM | POA: Diagnosis not present

## 2023-03-15 DIAGNOSIS — M47816 Spondylosis without myelopathy or radiculopathy, lumbar region: Secondary | ICD-10-CM | POA: Diagnosis not present

## 2023-03-22 ENCOUNTER — Other Ambulatory Visit (HOSPITAL_BASED_OUTPATIENT_CLINIC_OR_DEPARTMENT_OTHER): Payer: Self-pay

## 2023-03-22 MED ORDER — HYDROMORPHONE HCL 2 MG PO TABS
1.0000 mg | ORAL_TABLET | Freq: Four times a day (QID) | ORAL | 0 refills | Status: AC
  Filled 2023-03-22: qty 30, 15d supply, fill #0

## 2023-03-23 DIAGNOSIS — M313 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: Secondary | ICD-10-CM | POA: Diagnosis not present

## 2023-03-24 DIAGNOSIS — M47816 Spondylosis without myelopathy or radiculopathy, lumbar region: Secondary | ICD-10-CM | POA: Diagnosis not present

## 2023-03-24 DIAGNOSIS — G894 Chronic pain syndrome: Secondary | ICD-10-CM | POA: Diagnosis not present

## 2023-03-24 DIAGNOSIS — M542 Cervicalgia: Secondary | ICD-10-CM | POA: Diagnosis not present

## 2023-04-26 DIAGNOSIS — D509 Iron deficiency anemia, unspecified: Secondary | ICD-10-CM | POA: Diagnosis not present

## 2023-04-26 DIAGNOSIS — D469 Myelodysplastic syndrome, unspecified: Secondary | ICD-10-CM | POA: Diagnosis not present

## 2023-04-26 DIAGNOSIS — D471 Chronic myeloproliferative disease: Secondary | ICD-10-CM | POA: Diagnosis not present

## 2023-05-24 ENCOUNTER — Other Ambulatory Visit (HOSPITAL_BASED_OUTPATIENT_CLINIC_OR_DEPARTMENT_OTHER): Payer: Self-pay

## 2023-05-24 DIAGNOSIS — M542 Cervicalgia: Secondary | ICD-10-CM | POA: Diagnosis not present

## 2023-05-24 DIAGNOSIS — M47816 Spondylosis without myelopathy or radiculopathy, lumbar region: Secondary | ICD-10-CM | POA: Diagnosis not present

## 2023-05-24 DIAGNOSIS — M546 Pain in thoracic spine: Secondary | ICD-10-CM | POA: Diagnosis not present

## 2023-05-24 MED ORDER — HYDROMORPHONE HCL 2 MG PO TABS
1.0000 mg | ORAL_TABLET | Freq: Four times a day (QID) | ORAL | 0 refills | Status: AC | PRN
  Filled 2023-05-24: qty 30, 15d supply, fill #0

## 2023-05-30 ENCOUNTER — Other Ambulatory Visit (HOSPITAL_BASED_OUTPATIENT_CLINIC_OR_DEPARTMENT_OTHER): Payer: Self-pay

## 2023-05-30 DIAGNOSIS — Z0000 Encounter for general adult medical examination without abnormal findings: Secondary | ICD-10-CM | POA: Diagnosis not present

## 2023-05-30 DIAGNOSIS — E785 Hyperlipidemia, unspecified: Secondary | ICD-10-CM | POA: Diagnosis not present

## 2023-05-30 DIAGNOSIS — M810 Age-related osteoporosis without current pathological fracture: Secondary | ICD-10-CM | POA: Diagnosis not present

## 2023-05-30 DIAGNOSIS — Z0189 Encounter for other specified special examinations: Secondary | ICD-10-CM | POA: Diagnosis not present

## 2023-05-30 DIAGNOSIS — Z23 Encounter for immunization: Secondary | ICD-10-CM | POA: Diagnosis not present

## 2023-05-30 DIAGNOSIS — D509 Iron deficiency anemia, unspecified: Secondary | ICD-10-CM | POA: Diagnosis not present

## 2023-05-30 DIAGNOSIS — G629 Polyneuropathy, unspecified: Secondary | ICD-10-CM | POA: Diagnosis not present

## 2023-05-30 DIAGNOSIS — R946 Abnormal results of thyroid function studies: Secondary | ICD-10-CM | POA: Diagnosis not present

## 2023-05-30 DIAGNOSIS — I2699 Other pulmonary embolism without acute cor pulmonale: Secondary | ICD-10-CM | POA: Diagnosis not present

## 2023-05-30 DIAGNOSIS — I10 Essential (primary) hypertension: Secondary | ICD-10-CM | POA: Diagnosis not present

## 2023-05-30 DIAGNOSIS — E559 Vitamin D deficiency, unspecified: Secondary | ICD-10-CM | POA: Diagnosis not present

## 2023-05-30 MED ORDER — COVID-19 MRNA VAC-TRIS(PFIZER) 30 MCG/0.3ML IM SUSY
0.3000 mL | PREFILLED_SYRINGE | Freq: Once | INTRAMUSCULAR | 0 refills | Status: AC
  Filled 2023-05-30: qty 0.3, 1d supply, fill #0

## 2023-05-30 MED ORDER — INFLUENZA VAC A&B SURF ANT ADJ 0.5 ML IM SUSY
0.5000 mL | PREFILLED_SYRINGE | Freq: Once | INTRAMUSCULAR | 0 refills | Status: AC
  Filled 2023-05-30: qty 0.5, 1d supply, fill #0

## 2023-06-06 DIAGNOSIS — E785 Hyperlipidemia, unspecified: Secondary | ICD-10-CM | POA: Diagnosis not present

## 2023-06-06 DIAGNOSIS — M313 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: Secondary | ICD-10-CM | POA: Diagnosis not present

## 2023-06-06 DIAGNOSIS — Z7901 Long term (current) use of anticoagulants: Secondary | ICD-10-CM | POA: Diagnosis not present

## 2023-06-06 DIAGNOSIS — R82998 Other abnormal findings in urine: Secondary | ICD-10-CM | POA: Diagnosis not present

## 2023-06-06 DIAGNOSIS — Z0000 Encounter for general adult medical examination without abnormal findings: Secondary | ICD-10-CM | POA: Diagnosis not present

## 2023-06-06 DIAGNOSIS — F419 Anxiety disorder, unspecified: Secondary | ICD-10-CM | POA: Diagnosis not present

## 2023-06-06 DIAGNOSIS — E669 Obesity, unspecified: Secondary | ICD-10-CM | POA: Diagnosis not present

## 2023-06-06 DIAGNOSIS — Z8673 Personal history of transient ischemic attack (TIA), and cerebral infarction without residual deficits: Secondary | ICD-10-CM | POA: Diagnosis not present

## 2023-06-06 DIAGNOSIS — D6869 Other thrombophilia: Secondary | ICD-10-CM | POA: Diagnosis not present

## 2023-06-06 DIAGNOSIS — I10 Essential (primary) hypertension: Secondary | ICD-10-CM | POA: Diagnosis not present

## 2023-06-06 DIAGNOSIS — G8929 Other chronic pain: Secondary | ICD-10-CM | POA: Diagnosis not present

## 2023-06-06 DIAGNOSIS — I480 Paroxysmal atrial fibrillation: Secondary | ICD-10-CM | POA: Diagnosis not present

## 2023-06-06 DIAGNOSIS — D469 Myelodysplastic syndrome, unspecified: Secondary | ICD-10-CM | POA: Diagnosis not present

## 2023-06-11 ENCOUNTER — Other Ambulatory Visit (HOSPITAL_BASED_OUTPATIENT_CLINIC_OR_DEPARTMENT_OTHER): Payer: Self-pay

## 2023-06-11 MED ORDER — LORAZEPAM 0.5 MG PO TABS
0.5000 mg | ORAL_TABLET | Freq: Every evening | ORAL | 2 refills | Status: AC
  Filled 2023-06-11: qty 30, 30d supply, fill #0

## 2023-06-11 MED ORDER — PANTOPRAZOLE SODIUM 40 MG PO TBEC: 40.0000 mg | DELAYED_RELEASE_TABLET | Freq: Every day | ORAL | 3 refills | Status: AC

## 2023-06-11 MED ORDER — LISINOPRIL 20 MG PO TABS: 20.0000 mg | ORAL_TABLET | Freq: Every day | ORAL | 3 refills | Status: AC

## 2023-06-11 MED ORDER — HYDROCHLOROTHIAZIDE 12.5 MG PO CAPS
12.5000 mg | ORAL_CAPSULE | Freq: Every day | ORAL | 4 refills | Status: AC
  Filled 2023-06-11: qty 90, 90d supply, fill #0

## 2023-06-11 MED ORDER — GABAPENTIN 600 MG PO TABS
600.0000 mg | ORAL_TABLET | Freq: Two times a day (BID) | ORAL | 4 refills | Status: AC
  Filled 2023-06-11: qty 180, 90d supply, fill #0

## 2023-06-11 MED ORDER — HYDROXYUREA 500 MG PO CAPS
ORAL_CAPSULE | ORAL | 3 refills | Status: AC
  Filled 2023-06-11: qty 156, 90d supply, fill #0

## 2023-06-11 MED ORDER — ATORVASTATIN CALCIUM 40 MG PO TABS
40.0000 mg | ORAL_TABLET | Freq: Every day | ORAL | 3 refills | Status: AC
  Filled 2023-06-11: qty 90, 90d supply, fill #0

## 2023-06-11 MED ORDER — ELIQUIS 5 MG PO TABS
5.0000 mg | ORAL_TABLET | Freq: Two times a day (BID) | ORAL | 4 refills | Status: DC
  Filled 2023-06-11 – 2023-06-28 (×2): qty 180, 90d supply, fill #0
  Filled 2023-12-26 – 2024-01-24 (×2): qty 180, 90d supply, fill #1

## 2023-06-12 ENCOUNTER — Other Ambulatory Visit: Payer: Self-pay

## 2023-06-21 ENCOUNTER — Other Ambulatory Visit (HOSPITAL_BASED_OUTPATIENT_CLINIC_OR_DEPARTMENT_OTHER): Payer: Self-pay

## 2023-06-21 MED ORDER — AREXVY 120 MCG/0.5ML IM SUSR
INTRAMUSCULAR | 0 refills | Status: AC
  Filled 2023-06-21: qty 0.5, 1d supply, fill #0

## 2023-06-28 ENCOUNTER — Other Ambulatory Visit (HOSPITAL_BASED_OUTPATIENT_CLINIC_OR_DEPARTMENT_OTHER): Payer: Self-pay

## 2023-07-12 DIAGNOSIS — Z7960 Long term (current) use of unspecified immunomodulators and immunosuppressants: Secondary | ICD-10-CM | POA: Diagnosis not present

## 2023-07-12 DIAGNOSIS — M313 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: Secondary | ICD-10-CM | POA: Diagnosis not present

## 2023-08-09 DIAGNOSIS — L578 Other skin changes due to chronic exposure to nonionizing radiation: Secondary | ICD-10-CM | POA: Diagnosis not present

## 2023-08-09 DIAGNOSIS — L821 Other seborrheic keratosis: Secondary | ICD-10-CM | POA: Diagnosis not present

## 2023-08-09 DIAGNOSIS — L570 Actinic keratosis: Secondary | ICD-10-CM | POA: Diagnosis not present

## 2023-08-09 DIAGNOSIS — L814 Other melanin hyperpigmentation: Secondary | ICD-10-CM | POA: Diagnosis not present

## 2023-08-09 DIAGNOSIS — D229 Melanocytic nevi, unspecified: Secondary | ICD-10-CM | POA: Diagnosis not present

## 2023-08-09 DIAGNOSIS — Z85828 Personal history of other malignant neoplasm of skin: Secondary | ICD-10-CM | POA: Diagnosis not present

## 2023-08-09 DIAGNOSIS — D044 Carcinoma in situ of skin of scalp and neck: Secondary | ICD-10-CM | POA: Diagnosis not present

## 2023-09-11 ENCOUNTER — Other Ambulatory Visit (HOSPITAL_BASED_OUTPATIENT_CLINIC_OR_DEPARTMENT_OTHER): Payer: Self-pay

## 2023-09-11 MED ORDER — SHINGRIX 50 MCG/0.5ML IM SUSR
0.5000 mL | Freq: Once | INTRAMUSCULAR | 1 refills | Status: AC
  Filled 2023-09-11: qty 0.5, 1d supply, fill #0

## 2023-09-24 DIAGNOSIS — M313 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: Secondary | ICD-10-CM | POA: Diagnosis not present

## 2023-09-24 DIAGNOSIS — Z79899 Other long term (current) drug therapy: Secondary | ICD-10-CM | POA: Diagnosis not present

## 2023-10-01 DIAGNOSIS — J3489 Other specified disorders of nose and nasal sinuses: Secondary | ICD-10-CM | POA: Diagnosis not present

## 2023-10-01 DIAGNOSIS — R058 Other specified cough: Secondary | ICD-10-CM | POA: Diagnosis not present

## 2023-10-01 DIAGNOSIS — R5383 Other fatigue: Secondary | ICD-10-CM | POA: Diagnosis not present

## 2023-10-01 DIAGNOSIS — J029 Acute pharyngitis, unspecified: Secondary | ICD-10-CM | POA: Diagnosis not present

## 2023-10-01 DIAGNOSIS — Z1152 Encounter for screening for COVID-19: Secondary | ICD-10-CM | POA: Diagnosis not present

## 2023-10-23 ENCOUNTER — Other Ambulatory Visit: Payer: Self-pay | Admitting: Internal Medicine

## 2023-10-23 DIAGNOSIS — Z1231 Encounter for screening mammogram for malignant neoplasm of breast: Secondary | ICD-10-CM

## 2023-10-24 ENCOUNTER — Ambulatory Visit

## 2023-10-26 ENCOUNTER — Ambulatory Visit

## 2023-10-30 DIAGNOSIS — D471 Chronic myeloproliferative disease: Secondary | ICD-10-CM | POA: Diagnosis not present

## 2023-10-30 DIAGNOSIS — D649 Anemia, unspecified: Secondary | ICD-10-CM | POA: Diagnosis not present

## 2023-10-30 DIAGNOSIS — C946 Myelodysplastic disease, not classified: Secondary | ICD-10-CM | POA: Diagnosis not present

## 2023-10-30 DIAGNOSIS — D509 Iron deficiency anemia, unspecified: Secondary | ICD-10-CM | POA: Diagnosis not present

## 2023-10-30 DIAGNOSIS — D709 Neutropenia, unspecified: Secondary | ICD-10-CM | POA: Diagnosis not present

## 2023-11-01 ENCOUNTER — Ambulatory Visit
Admission: RE | Admit: 2023-11-01 | Discharge: 2023-11-01 | Disposition: A | Discharge status: Home / Self Care | Source: Ambulatory Visit | Attending: Internal Medicine | Admitting: Internal Medicine

## 2023-11-01 DIAGNOSIS — Z1231 Encounter for screening mammogram for malignant neoplasm of breast: Secondary | ICD-10-CM | POA: Diagnosis not present

## 2023-11-06 DIAGNOSIS — E785 Hyperlipidemia, unspecified: Secondary | ICD-10-CM | POA: Diagnosis not present

## 2023-11-06 DIAGNOSIS — Z1389 Encounter for screening for other disorder: Secondary | ICD-10-CM | POA: Diagnosis not present

## 2023-11-06 DIAGNOSIS — D509 Iron deficiency anemia, unspecified: Secondary | ICD-10-CM | POA: Diagnosis not present

## 2023-11-06 DIAGNOSIS — I10 Essential (primary) hypertension: Secondary | ICD-10-CM | POA: Diagnosis not present

## 2023-11-06 DIAGNOSIS — R7989 Other specified abnormal findings of blood chemistry: Secondary | ICD-10-CM | POA: Diagnosis not present

## 2023-12-11 DIAGNOSIS — L603 Nail dystrophy: Secondary | ICD-10-CM | POA: Diagnosis not present

## 2023-12-11 DIAGNOSIS — L299 Pruritus, unspecified: Secondary | ICD-10-CM | POA: Diagnosis not present

## 2023-12-11 DIAGNOSIS — L219 Seborrheic dermatitis, unspecified: Secondary | ICD-10-CM | POA: Diagnosis not present

## 2023-12-11 DIAGNOSIS — L659 Nonscarring hair loss, unspecified: Secondary | ICD-10-CM | POA: Diagnosis not present

## 2023-12-25 DIAGNOSIS — K219 Gastro-esophageal reflux disease without esophagitis: Secondary | ICD-10-CM | POA: Diagnosis not present

## 2023-12-25 DIAGNOSIS — I2699 Other pulmonary embolism without acute cor pulmonale: Secondary | ICD-10-CM | POA: Diagnosis not present

## 2023-12-25 DIAGNOSIS — Z8673 Personal history of transient ischemic attack (TIA), and cerebral infarction without residual deficits: Secondary | ICD-10-CM | POA: Diagnosis not present

## 2023-12-25 DIAGNOSIS — J3489 Other specified disorders of nose and nasal sinuses: Secondary | ICD-10-CM | POA: Diagnosis not present

## 2023-12-25 DIAGNOSIS — M313 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: Secondary | ICD-10-CM | POA: Diagnosis not present

## 2023-12-25 DIAGNOSIS — E785 Hyperlipidemia, unspecified: Secondary | ICD-10-CM | POA: Diagnosis not present

## 2023-12-25 DIAGNOSIS — L89322 Pressure ulcer of left buttock, stage 2: Secondary | ICD-10-CM | POA: Diagnosis not present

## 2023-12-25 DIAGNOSIS — D469 Myelodysplastic syndrome, unspecified: Secondary | ICD-10-CM | POA: Diagnosis not present

## 2023-12-25 DIAGNOSIS — I10 Essential (primary) hypertension: Secondary | ICD-10-CM | POA: Diagnosis not present

## 2023-12-25 DIAGNOSIS — D6869 Other thrombophilia: Secondary | ICD-10-CM | POA: Diagnosis not present

## 2023-12-25 DIAGNOSIS — G629 Polyneuropathy, unspecified: Secondary | ICD-10-CM | POA: Diagnosis not present

## 2023-12-25 DIAGNOSIS — I480 Paroxysmal atrial fibrillation: Secondary | ICD-10-CM | POA: Diagnosis not present

## 2023-12-26 ENCOUNTER — Other Ambulatory Visit (HOSPITAL_BASED_OUTPATIENT_CLINIC_OR_DEPARTMENT_OTHER): Payer: Self-pay

## 2024-01-02 DIAGNOSIS — L89322 Pressure ulcer of left buttock, stage 2: Secondary | ICD-10-CM | POA: Diagnosis not present

## 2024-01-10 DIAGNOSIS — M79604 Pain in right leg: Secondary | ICD-10-CM | POA: Diagnosis not present

## 2024-01-10 DIAGNOSIS — M313 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: Secondary | ICD-10-CM | POA: Diagnosis not present

## 2024-01-10 DIAGNOSIS — Z7960 Long term (current) use of unspecified immunomodulators and immunosuppressants: Secondary | ICD-10-CM | POA: Diagnosis not present

## 2024-01-10 DIAGNOSIS — M7989 Other specified soft tissue disorders: Secondary | ICD-10-CM | POA: Diagnosis not present

## 2024-01-11 DIAGNOSIS — I872 Venous insufficiency (chronic) (peripheral): Secondary | ICD-10-CM | POA: Diagnosis not present

## 2024-01-11 DIAGNOSIS — I480 Paroxysmal atrial fibrillation: Secondary | ICD-10-CM | POA: Diagnosis not present

## 2024-01-11 DIAGNOSIS — M79604 Pain in right leg: Secondary | ICD-10-CM | POA: Diagnosis not present

## 2024-01-11 DIAGNOSIS — L52 Erythema nodosum: Secondary | ICD-10-CM | POA: Diagnosis not present

## 2024-01-11 DIAGNOSIS — M79605 Pain in left leg: Secondary | ICD-10-CM | POA: Diagnosis not present

## 2024-01-11 DIAGNOSIS — I8310 Varicose veins of unspecified lower extremity with inflammation: Secondary | ICD-10-CM | POA: Diagnosis not present

## 2024-01-15 DIAGNOSIS — H18593 Other hereditary corneal dystrophies, bilateral: Secondary | ICD-10-CM | POA: Diagnosis not present

## 2024-01-15 DIAGNOSIS — H1820 Unspecified corneal edema: Secondary | ICD-10-CM | POA: Diagnosis not present

## 2024-01-15 DIAGNOSIS — H52201 Unspecified astigmatism, right eye: Secondary | ICD-10-CM | POA: Diagnosis not present

## 2024-01-16 ENCOUNTER — Other Ambulatory Visit (HOSPITAL_BASED_OUTPATIENT_CLINIC_OR_DEPARTMENT_OTHER): Payer: Self-pay

## 2024-01-24 ENCOUNTER — Other Ambulatory Visit (HOSPITAL_BASED_OUTPATIENT_CLINIC_OR_DEPARTMENT_OTHER): Payer: Self-pay

## 2024-01-31 DIAGNOSIS — D044 Carcinoma in situ of skin of scalp and neck: Secondary | ICD-10-CM | POA: Diagnosis not present

## 2024-01-31 DIAGNOSIS — L814 Other melanin hyperpigmentation: Secondary | ICD-10-CM | POA: Diagnosis not present

## 2024-01-31 DIAGNOSIS — L299 Pruritus, unspecified: Secondary | ICD-10-CM | POA: Diagnosis not present

## 2024-01-31 DIAGNOSIS — L570 Actinic keratosis: Secondary | ICD-10-CM | POA: Diagnosis not present

## 2024-01-31 DIAGNOSIS — Z85828 Personal history of other malignant neoplasm of skin: Secondary | ICD-10-CM | POA: Diagnosis not present

## 2024-01-31 DIAGNOSIS — L578 Other skin changes due to chronic exposure to nonionizing radiation: Secondary | ICD-10-CM | POA: Diagnosis not present

## 2024-01-31 DIAGNOSIS — M793 Panniculitis, unspecified: Secondary | ICD-10-CM | POA: Diagnosis not present

## 2024-01-31 DIAGNOSIS — I739 Peripheral vascular disease, unspecified: Secondary | ICD-10-CM | POA: Diagnosis not present

## 2024-01-31 DIAGNOSIS — L821 Other seborrheic keratosis: Secondary | ICD-10-CM | POA: Diagnosis not present

## 2024-01-31 DIAGNOSIS — I872 Venous insufficiency (chronic) (peripheral): Secondary | ICD-10-CM | POA: Diagnosis not present

## 2024-01-31 DIAGNOSIS — D229 Melanocytic nevi, unspecified: Secondary | ICD-10-CM | POA: Diagnosis not present

## 2024-02-19 DIAGNOSIS — Z1331 Encounter for screening for depression: Secondary | ICD-10-CM | POA: Diagnosis not present

## 2024-02-19 DIAGNOSIS — I890 Lymphedema, not elsewhere classified: Secondary | ICD-10-CM | POA: Diagnosis not present

## 2024-03-10 ENCOUNTER — Other Ambulatory Visit (HOSPITAL_BASED_OUTPATIENT_CLINIC_OR_DEPARTMENT_OTHER): Payer: Self-pay

## 2024-03-10 MED ORDER — ZOSTER VAC RECOMB ADJUVANTED 50 MCG/0.5ML IM SUSR
0.5000 mL | Freq: Once | INTRAMUSCULAR | 0 refills | Status: AC
  Filled 2024-03-10: qty 0.5, 1d supply, fill #0

## 2024-03-24 DIAGNOSIS — M313 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: Secondary | ICD-10-CM | POA: Diagnosis not present

## 2024-03-28 DIAGNOSIS — Z7901 Long term (current) use of anticoagulants: Secondary | ICD-10-CM | POA: Diagnosis not present

## 2024-03-28 DIAGNOSIS — G629 Polyneuropathy, unspecified: Secondary | ICD-10-CM | POA: Diagnosis not present

## 2024-03-28 DIAGNOSIS — I890 Lymphedema, not elsewhere classified: Secondary | ICD-10-CM | POA: Diagnosis not present

## 2024-04-28 DIAGNOSIS — I890 Lymphedema, not elsewhere classified: Secondary | ICD-10-CM | POA: Diagnosis not present

## 2024-05-01 DIAGNOSIS — D509 Iron deficiency anemia, unspecified: Secondary | ICD-10-CM | POA: Diagnosis not present

## 2024-05-01 DIAGNOSIS — C946 Myelodysplastic disease, not classified: Secondary | ICD-10-CM | POA: Diagnosis not present

## 2024-05-21 DIAGNOSIS — I890 Lymphedema, not elsewhere classified: Secondary | ICD-10-CM | POA: Diagnosis not present

## 2024-06-03 ENCOUNTER — Other Ambulatory Visit (HOSPITAL_BASED_OUTPATIENT_CLINIC_OR_DEPARTMENT_OTHER): Payer: Self-pay

## 2024-06-03 DIAGNOSIS — Z23 Encounter for immunization: Secondary | ICD-10-CM | POA: Diagnosis not present

## 2024-06-03 MED ORDER — COMIRNATY 30 MCG/0.3ML IM SUSY
0.3000 mL | PREFILLED_SYRINGE | Freq: Once | INTRAMUSCULAR | 0 refills | Status: AC
  Filled 2024-06-03: qty 0.3, 1d supply, fill #0

## 2024-06-03 MED ORDER — FLUZONE HIGH-DOSE 0.5 ML IM SUSY
0.5000 mL | PREFILLED_SYRINGE | Freq: Once | INTRAMUSCULAR | 0 refills | Status: AC
  Filled 2024-06-03: qty 0.5, 1d supply, fill #0

## 2024-06-30 DIAGNOSIS — M810 Age-related osteoporosis without current pathological fracture: Secondary | ICD-10-CM | POA: Diagnosis not present

## 2024-07-31 ENCOUNTER — Other Ambulatory Visit (HOSPITAL_BASED_OUTPATIENT_CLINIC_OR_DEPARTMENT_OTHER): Payer: Self-pay

## 2024-07-31 MED ORDER — ELIQUIS 5 MG PO TABS
5.0000 mg | ORAL_TABLET | Freq: Two times a day (BID) | ORAL | 3 refills | Status: AC
  Filled 2024-07-31: qty 180, 90d supply, fill #0

## 2024-08-04 ENCOUNTER — Other Ambulatory Visit (HOSPITAL_BASED_OUTPATIENT_CLINIC_OR_DEPARTMENT_OTHER): Payer: Self-pay

## 2024-08-05 ENCOUNTER — Other Ambulatory Visit: Payer: Self-pay

## 2024-08-05 ENCOUNTER — Other Ambulatory Visit (HOSPITAL_BASED_OUTPATIENT_CLINIC_OR_DEPARTMENT_OTHER): Payer: Self-pay

## 2024-08-06 ENCOUNTER — Other Ambulatory Visit: Payer: Self-pay

## 2024-08-07 DIAGNOSIS — M313 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: Secondary | ICD-10-CM | POA: Diagnosis not present

## 2024-08-07 DIAGNOSIS — Z7960 Long term (current) use of unspecified immunomodulators and immunosuppressants: Secondary | ICD-10-CM | POA: Diagnosis not present

## 2024-08-07 DIAGNOSIS — L570 Actinic keratosis: Secondary | ICD-10-CM | POA: Diagnosis not present
